# Patient Record
Sex: Male | Born: 1950 | Race: Black or African American | Hispanic: No | Marital: Single | Smoking: Former smoker
Health system: Southern US, Community
[De-identification: ages and names within clinical notes are randomized; demographics above are authoritative.]

## PROBLEM LIST (undated history)

## (undated) DIAGNOSIS — F1020 Alcohol dependence, uncomplicated: Secondary | ICD-10-CM

## (undated) DIAGNOSIS — U071 COVID-19: Secondary | ICD-10-CM

## (undated) DIAGNOSIS — I509 Heart failure, unspecified: Secondary | ICD-10-CM

## (undated) DIAGNOSIS — J449 Chronic obstructive pulmonary disease, unspecified: Secondary | ICD-10-CM

## (undated) DIAGNOSIS — N189 Chronic kidney disease, unspecified: Secondary | ICD-10-CM

## (undated) DIAGNOSIS — I219 Acute myocardial infarction, unspecified: Secondary | ICD-10-CM

## (undated) DIAGNOSIS — I10 Essential (primary) hypertension: Secondary | ICD-10-CM

## (undated) DIAGNOSIS — E785 Hyperlipidemia, unspecified: Secondary | ICD-10-CM

## (undated) DIAGNOSIS — T7840XA Allergy, unspecified, initial encounter: Secondary | ICD-10-CM

## (undated) DIAGNOSIS — M810 Age-related osteoporosis without current pathological fracture: Secondary | ICD-10-CM

## (undated) DIAGNOSIS — R0600 Dyspnea, unspecified: Secondary | ICD-10-CM

## (undated) DIAGNOSIS — Z8489 Family history of other specified conditions: Secondary | ICD-10-CM

## (undated) DIAGNOSIS — M1990 Unspecified osteoarthritis, unspecified site: Secondary | ICD-10-CM

## (undated) DIAGNOSIS — F32A Depression, unspecified: Secondary | ICD-10-CM

## (undated) DIAGNOSIS — J45909 Unspecified asthma, uncomplicated: Secondary | ICD-10-CM

## (undated) DIAGNOSIS — R011 Cardiac murmur, unspecified: Secondary | ICD-10-CM

## (undated) DIAGNOSIS — C801 Malignant (primary) neoplasm, unspecified: Secondary | ICD-10-CM

## (undated) DIAGNOSIS — H269 Unspecified cataract: Secondary | ICD-10-CM

## (undated) DIAGNOSIS — F1910 Other psychoactive substance abuse, uncomplicated: Secondary | ICD-10-CM

## (undated) DIAGNOSIS — F329 Major depressive disorder, single episode, unspecified: Secondary | ICD-10-CM

## (undated) DIAGNOSIS — J189 Pneumonia, unspecified organism: Secondary | ICD-10-CM

## (undated) DIAGNOSIS — I499 Cardiac arrhythmia, unspecified: Secondary | ICD-10-CM

## (undated) DIAGNOSIS — K219 Gastro-esophageal reflux disease without esophagitis: Secondary | ICD-10-CM

## (undated) HISTORY — DX: Chronic obstructive pulmonary disease, unspecified: J44.9

## (undated) HISTORY — DX: Unspecified cataract: H26.9

## (undated) HISTORY — DX: Cardiac murmur, unspecified: R01.1

## (undated) HISTORY — DX: Allergy, unspecified, initial encounter: T78.40XA

## (undated) HISTORY — DX: Other psychoactive substance abuse, uncomplicated: F19.10

## (undated) HISTORY — DX: Acute myocardial infarction, unspecified: I21.9

## (undated) HISTORY — DX: Age-related osteoporosis without current pathological fracture: M81.0

## (undated) HISTORY — PX: COLONOSCOPY: SHX174

## (undated) HISTORY — PX: CATARACT EXTRACTION: SUR2

---

## 1998-03-13 ENCOUNTER — Emergency Department (HOSPITAL_COMMUNITY): Admission: EM | Admit: 1998-03-13 | Discharge: 1998-03-13 | Payer: Self-pay | Admitting: Emergency Medicine

## 1998-03-27 ENCOUNTER — Encounter: Payer: Self-pay | Admitting: *Deleted

## 1998-03-27 ENCOUNTER — Emergency Department (HOSPITAL_COMMUNITY): Admission: EM | Admit: 1998-03-27 | Discharge: 1998-03-27 | Payer: Self-pay | Admitting: *Deleted

## 1998-07-09 ENCOUNTER — Emergency Department (HOSPITAL_COMMUNITY): Admission: EM | Admit: 1998-07-09 | Discharge: 1998-07-09 | Payer: Self-pay | Admitting: Emergency Medicine

## 1998-09-01 ENCOUNTER — Emergency Department (HOSPITAL_COMMUNITY): Admission: EM | Admit: 1998-09-01 | Discharge: 1998-09-01 | Payer: Self-pay | Admitting: Emergency Medicine

## 1998-09-01 ENCOUNTER — Encounter: Payer: Self-pay | Admitting: Emergency Medicine

## 1999-05-11 ENCOUNTER — Encounter: Payer: Self-pay | Admitting: *Deleted

## 1999-05-11 ENCOUNTER — Emergency Department (HOSPITAL_COMMUNITY): Admission: EM | Admit: 1999-05-11 | Discharge: 1999-05-11 | Payer: Self-pay | Admitting: *Deleted

## 1999-12-08 ENCOUNTER — Encounter: Payer: Self-pay | Admitting: Emergency Medicine

## 1999-12-08 ENCOUNTER — Inpatient Hospital Stay (HOSPITAL_COMMUNITY): Admission: EM | Admit: 1999-12-08 | Discharge: 1999-12-12 | Payer: Self-pay | Admitting: Emergency Medicine

## 1999-12-10 ENCOUNTER — Encounter: Payer: Self-pay | Admitting: Cardiovascular Disease

## 2000-12-31 ENCOUNTER — Inpatient Hospital Stay (HOSPITAL_COMMUNITY): Admission: EM | Admit: 2000-12-31 | Discharge: 2001-01-01 | Payer: Self-pay

## 2001-01-01 ENCOUNTER — Encounter: Payer: Self-pay | Admitting: Family Medicine

## 2001-01-13 ENCOUNTER — Encounter: Admission: RE | Admit: 2001-01-13 | Discharge: 2001-01-13 | Payer: Self-pay | Admitting: Sports Medicine

## 2001-02-26 ENCOUNTER — Encounter: Payer: Self-pay | Admitting: Family Medicine

## 2001-02-26 ENCOUNTER — Ambulatory Visit (HOSPITAL_COMMUNITY): Admission: RE | Admit: 2001-02-26 | Discharge: 2001-02-26 | Payer: Self-pay | Admitting: Family Medicine

## 2001-03-12 ENCOUNTER — Encounter: Payer: Self-pay | Admitting: Family Medicine

## 2001-03-12 ENCOUNTER — Ambulatory Visit (HOSPITAL_COMMUNITY): Admission: RE | Admit: 2001-03-12 | Discharge: 2001-03-12 | Payer: Self-pay | Admitting: Family Medicine

## 2002-03-28 ENCOUNTER — Emergency Department (HOSPITAL_COMMUNITY): Admission: EM | Admit: 2002-03-28 | Discharge: 2002-03-29 | Payer: Self-pay | Admitting: Emergency Medicine

## 2002-07-02 ENCOUNTER — Encounter (INDEPENDENT_AMBULATORY_CARE_PROVIDER_SITE_OTHER): Payer: Self-pay | Admitting: Family Medicine

## 2002-07-02 LAB — CONVERTED CEMR LAB: PSA: 0.56 ng/mL

## 2002-10-08 ENCOUNTER — Emergency Department (HOSPITAL_COMMUNITY): Admission: EM | Admit: 2002-10-08 | Discharge: 2002-10-09 | Payer: Self-pay | Admitting: Emergency Medicine

## 2002-10-08 ENCOUNTER — Encounter: Payer: Self-pay | Admitting: Emergency Medicine

## 2002-11-22 ENCOUNTER — Emergency Department (HOSPITAL_COMMUNITY): Admission: EM | Admit: 2002-11-22 | Discharge: 2002-11-22 | Payer: Self-pay | Admitting: Emergency Medicine

## 2002-12-24 ENCOUNTER — Emergency Department (HOSPITAL_COMMUNITY): Admission: EM | Admit: 2002-12-24 | Discharge: 2002-12-24 | Payer: Self-pay | Admitting: Emergency Medicine

## 2002-12-24 ENCOUNTER — Encounter: Payer: Self-pay | Admitting: Emergency Medicine

## 2002-12-30 ENCOUNTER — Ambulatory Visit (HOSPITAL_COMMUNITY): Admission: RE | Admit: 2002-12-30 | Discharge: 2002-12-30 | Payer: Self-pay | Admitting: Family Medicine

## 2002-12-30 ENCOUNTER — Encounter: Payer: Self-pay | Admitting: Family Medicine

## 2003-02-05 ENCOUNTER — Encounter: Payer: Self-pay | Admitting: Emergency Medicine

## 2003-02-05 ENCOUNTER — Emergency Department (HOSPITAL_COMMUNITY): Admission: EM | Admit: 2003-02-05 | Discharge: 2003-02-05 | Payer: Self-pay | Admitting: *Deleted

## 2003-09-29 ENCOUNTER — Emergency Department (HOSPITAL_COMMUNITY): Admission: EM | Admit: 2003-09-29 | Discharge: 2003-09-29 | Payer: Self-pay | Admitting: Emergency Medicine

## 2003-09-30 ENCOUNTER — Emergency Department (HOSPITAL_COMMUNITY): Admission: AD | Admit: 2003-09-30 | Discharge: 2003-09-30 | Payer: Self-pay | Admitting: Family Medicine

## 2004-01-27 ENCOUNTER — Emergency Department (HOSPITAL_COMMUNITY): Admission: EM | Admit: 2004-01-27 | Discharge: 2004-01-27 | Payer: Self-pay | Admitting: Emergency Medicine

## 2004-05-12 ENCOUNTER — Emergency Department (HOSPITAL_COMMUNITY): Admission: EM | Admit: 2004-05-12 | Discharge: 2004-05-12 | Payer: Self-pay | Admitting: Internal Medicine

## 2004-06-16 ENCOUNTER — Emergency Department (HOSPITAL_COMMUNITY): Admission: EM | Admit: 2004-06-16 | Discharge: 2004-06-16 | Payer: Self-pay | Admitting: Emergency Medicine

## 2004-06-28 ENCOUNTER — Ambulatory Visit: Payer: Self-pay | Admitting: Family Medicine

## 2004-07-12 ENCOUNTER — Ambulatory Visit: Payer: Self-pay | Admitting: Family Medicine

## 2004-07-15 ENCOUNTER — Ambulatory Visit (HOSPITAL_COMMUNITY): Admission: RE | Admit: 2004-07-15 | Discharge: 2004-07-15 | Payer: Self-pay | Admitting: Family Medicine

## 2004-07-23 ENCOUNTER — Ambulatory Visit: Payer: Self-pay | Admitting: Family Medicine

## 2004-08-05 ENCOUNTER — Ambulatory Visit: Payer: Self-pay | Admitting: Family Medicine

## 2005-05-29 ENCOUNTER — Emergency Department (HOSPITAL_COMMUNITY): Admission: EM | Admit: 2005-05-29 | Discharge: 2005-05-30 | Payer: Self-pay | Admitting: *Deleted

## 2005-08-13 ENCOUNTER — Ambulatory Visit: Payer: Self-pay | Admitting: Family Medicine

## 2005-09-16 ENCOUNTER — Ambulatory Visit: Payer: Self-pay | Admitting: Family Medicine

## 2006-02-27 ENCOUNTER — Ambulatory Visit: Payer: Self-pay | Admitting: Family Medicine

## 2006-07-31 ENCOUNTER — Ambulatory Visit: Payer: Self-pay | Admitting: Family Medicine

## 2007-03-22 ENCOUNTER — Encounter (INDEPENDENT_AMBULATORY_CARE_PROVIDER_SITE_OTHER): Payer: Self-pay | Admitting: Family Medicine

## 2007-03-22 DIAGNOSIS — IMO0002 Reserved for concepts with insufficient information to code with codable children: Secondary | ICD-10-CM | POA: Insufficient documentation

## 2007-03-22 DIAGNOSIS — I10 Essential (primary) hypertension: Secondary | ICD-10-CM

## 2007-03-22 DIAGNOSIS — M8700 Idiopathic aseptic necrosis of unspecified bone: Secondary | ICD-10-CM | POA: Insufficient documentation

## 2007-06-19 ENCOUNTER — Emergency Department (HOSPITAL_COMMUNITY): Admission: EM | Admit: 2007-06-19 | Discharge: 2007-06-19 | Payer: Self-pay | Admitting: Emergency Medicine

## 2007-07-13 ENCOUNTER — Emergency Department (HOSPITAL_COMMUNITY): Admission: EM | Admit: 2007-07-13 | Discharge: 2007-07-13 | Payer: Self-pay | Admitting: Emergency Medicine

## 2007-07-25 ENCOUNTER — Emergency Department (HOSPITAL_COMMUNITY): Admission: EM | Admit: 2007-07-25 | Discharge: 2007-07-25 | Payer: Self-pay | Admitting: Emergency Medicine

## 2007-08-28 ENCOUNTER — Emergency Department (HOSPITAL_COMMUNITY): Admission: EM | Admit: 2007-08-28 | Discharge: 2007-08-28 | Payer: Self-pay | Admitting: Emergency Medicine

## 2007-09-16 ENCOUNTER — Ambulatory Visit: Payer: Self-pay | Admitting: Family Medicine

## 2007-09-17 ENCOUNTER — Ambulatory Visit (HOSPITAL_COMMUNITY): Admission: RE | Admit: 2007-09-17 | Discharge: 2007-09-17 | Payer: Self-pay | Admitting: Family Medicine

## 2007-11-16 ENCOUNTER — Encounter (INDEPENDENT_AMBULATORY_CARE_PROVIDER_SITE_OTHER): Payer: Self-pay | Admitting: Family Medicine

## 2007-11-16 ENCOUNTER — Ambulatory Visit: Payer: Self-pay | Admitting: Internal Medicine

## 2007-11-16 LAB — CONVERTED CEMR LAB
ALT: 13 units/L (ref 0–53)
CO2: 22 meq/L (ref 19–32)
Calcium: 9.6 mg/dL (ref 8.4–10.5)
Chloride: 104 meq/L (ref 96–112)
Creatinine, Ser: 1.05 mg/dL (ref 0.40–1.50)
Eosinophils Relative: 4 % (ref 0–5)
Glucose, Bld: 85 mg/dL (ref 70–99)
HCT: 41.5 % (ref 39.0–52.0)
Lymphocytes Relative: 63 % — ABNORMAL HIGH (ref 12–46)
Lymphs Abs: 3.2 10*3/uL (ref 0.7–4.0)
MCV: 89.4 fL (ref 78.0–100.0)
Monocytes Relative: 6 % (ref 3–12)
PSA: 0.58 ng/mL (ref 0.10–4.00)
Platelets: 212 10*3/uL (ref 150–400)
RBC: 4.64 M/uL (ref 4.22–5.81)
Sed Rate: 6 mm/hr (ref 0–16)
Sodium: 139 meq/L (ref 135–145)
Total Protein: 7.6 g/dL (ref 6.0–8.3)
WBC: 5.1 10*3/uL (ref 4.0–10.5)

## 2007-11-19 ENCOUNTER — Ambulatory Visit (HOSPITAL_COMMUNITY): Admission: RE | Admit: 2007-11-19 | Discharge: 2007-11-19 | Payer: Self-pay | Admitting: Family Medicine

## 2007-12-30 ENCOUNTER — Ambulatory Visit: Payer: Self-pay | Admitting: Internal Medicine

## 2007-12-30 ENCOUNTER — Ambulatory Visit (HOSPITAL_COMMUNITY): Admission: RE | Admit: 2007-12-30 | Discharge: 2007-12-30 | Payer: Self-pay | Admitting: Family Medicine

## 2008-01-11 ENCOUNTER — Ambulatory Visit: Payer: Self-pay | Admitting: Family Medicine

## 2008-04-21 ENCOUNTER — Emergency Department (HOSPITAL_COMMUNITY): Admission: EM | Admit: 2008-04-21 | Discharge: 2008-04-21 | Payer: Self-pay | Admitting: Emergency Medicine

## 2008-04-25 ENCOUNTER — Encounter: Admission: RE | Admit: 2008-04-25 | Discharge: 2008-07-24 | Payer: Self-pay | Admitting: Orthopedic Surgery

## 2008-09-24 ENCOUNTER — Emergency Department (HOSPITAL_COMMUNITY): Admission: EM | Admit: 2008-09-24 | Discharge: 2008-09-24 | Payer: Self-pay | Admitting: Emergency Medicine

## 2008-12-05 ENCOUNTER — Observation Stay (HOSPITAL_COMMUNITY): Admission: EM | Admit: 2008-12-05 | Discharge: 2008-12-06 | Payer: Self-pay | Admitting: Emergency Medicine

## 2009-01-04 ENCOUNTER — Emergency Department (HOSPITAL_COMMUNITY): Admission: EM | Admit: 2009-01-04 | Discharge: 2009-01-04 | Payer: Self-pay | Admitting: Emergency Medicine

## 2009-12-05 ENCOUNTER — Emergency Department (HOSPITAL_COMMUNITY): Admission: EM | Admit: 2009-12-05 | Discharge: 2009-12-05 | Payer: Self-pay | Admitting: Emergency Medicine

## 2009-12-20 ENCOUNTER — Ambulatory Visit: Payer: Self-pay | Admitting: Family Medicine

## 2010-08-18 ENCOUNTER — Encounter: Payer: Self-pay | Admitting: Family Medicine

## 2010-11-04 LAB — STOOL CULTURE

## 2010-11-04 LAB — COMPREHENSIVE METABOLIC PANEL
ALT: 16 U/L (ref 0–53)
AST: 21 U/L (ref 0–37)
Alkaline Phosphatase: 76 U/L (ref 39–117)
CO2: 25 mEq/L (ref 19–32)
Chloride: 108 mEq/L (ref 96–112)
GFR calc Af Amer: >60 mL/min (ref >60)
GFR calc non Af Amer: >60 mL/min (ref >60)
Glucose, Bld: 92 mg/dL (ref 70–99)
Potassium: 4.3 mEq/L (ref 3.5–5.1)
Sodium: 139 mEq/L (ref 135–145)

## 2010-11-04 LAB — CLOSTRIDIUM DIFFICILE EIA

## 2010-11-04 LAB — LIPASE, BLOOD: Lipase: 15 U/L (ref 11–59)

## 2010-11-04 LAB — DIFFERENTIAL
Basophils Relative: 1 % (ref 0–1)
Eosinophils Absolute: 0.2 10*3/uL (ref 0.0–0.7)
Eosinophils Relative: 5 % (ref 0–5)
Neutrophils Relative %: 46 % (ref 43–77)

## 2010-11-04 LAB — CBC
Hemoglobin: 15.2 g/dL (ref 13.0–17.0)
MCHC: 34.2 g/dL (ref 30.0–36.0)
RBC: 4.86 MIL/uL (ref 4.22–5.81)

## 2010-11-05 LAB — DIFFERENTIAL
Basophils Relative: 1 % (ref 0–1)
Eosinophils Absolute: 0.2 10*3/uL (ref 0.0–0.7)
Lymphs Abs: 2.2 10*3/uL (ref 0.7–4.0)
Neutrophils Relative %: 48 % (ref 43–77)

## 2010-11-05 LAB — POCT I-STAT, CHEM 8
Creatinine, Ser: 1.2 mg/dL (ref 0.4–1.5)
HCT: 49 % (ref 39.0–52.0)
Hemoglobin: 16.7 g/dL (ref 13.0–17.0)
Potassium: 4.2 mEq/L (ref 3.5–5.1)
Sodium: 136 mEq/L (ref 135–145)

## 2010-11-05 LAB — CBC
MCV: 92.1 fL (ref 78.0–100.0)
Platelets: 237 10*3/uL (ref 150–400)
WBC: 5.3 10*3/uL (ref 4.0–10.5)

## 2010-12-13 NOTE — Cardiovascular Report (Signed)
[place]. [facility]  Patient:    Joe Garcia, Joe Garcia                     MRN: [id number] Proc. Date: 12/11/99 Adm. Date:  [id number] Disc. Date: [id number] Attending:  Edwyna Perfect CC:         C. Ulyess Mort, M.D.             Cardiac Catheterization Lab                        Cardiac Catheterization  PROCEDURE DONE BY:  Ricki Rodriguez, M.D.  CINE NO. CD [id number]  REFERRING PHYSICIAN:  C. Ulyess Mort, M.D.  PROCEDURES PERFORMED: 1. Left heart catheterization. 2. Selective coronary angiography. 3. Left ventricular function study. 4. Descending aortography.  INDICATIONS:  This 60 year old black male had substernal chest pain with abnormal CK-MBs and abnormal nuclear stress test, and he has a history of multiple risk factors.  COMPLICATIONS:  None.  Perclose suture was applied at the end of the procedure.  APPROACH:  Right femoral artery using 6-French diagnostic catheters.  HEMODYNAMIC DATA:  The left ventricular pressure was 155/12 mmHg.  The aortic pressure was 157/95 mmHg.  ANGIOGRAPHIC DATA:  Left ventriculogram:  The left ventriculogram showed mild anterior wall hypokinesia with an ejection fraction of 60%.  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  Coronary anatomy: 1. The left main coronary artery was unremarkable. 2. The left anterior descending artery was a long vessel wrapping around the    apex of the heart supplying posterior septum.  A diagonal vessel was    unremarkable. 3. The left circumflex coronary artery was a large vessel with very small    obtuse marginal branches. 4. The right coronary artery was dominant with small posterior descending    coronary artery.  IMPRESSION: 1. Normal coronaries. 2. Preserved left ventricular systolic function.  RECOMMENDATION:  This patient will undergo medical treatment with  evaluation for noncardiac chest pain.  The patient was strongly recommended to abstain from smoking and using any drugs.  He will be followed by me in my office in two weeks post discharge. DD:  12/11/99 TD:  12/15/99 Job: [id number]

## 2010-12-13 NOTE — Discharge Summary (Signed)
[place]. [facility]  Patient:    Joe Garcia, Joe Garcia                     MRN: [id number] Adm. Date:  [id number] Disc. Date: [id number] Attending:  Doneta Public Dictator:   Kevin Fenton, M.D. CC:         Ricki Rodriguez, M.D.  [facility]   Discharge Summary  DISCHARGE MEDICATIONS: 1. Flovent two puffs 110 mcg b.i.d. 2. Albuterol two puffs q.4h. p.r.n. 3. Aspirin 81 mg p.o. q.d. 4. Pepcid 20 mg p.o. b.i.d. as needed. 5. Toprol XL 25 mg one daily.  DIET:  The patient is to follow a low-fat, low-salt diet.  FOLLOW-UP:  Follow up with [facility] in two weeks.  He is also to call Ricki Rodriguez, M.D., for an appointment in two weeks.  HISTORY OF PRESENT ILLNESS:  The patient is a 60 year old African-American male who presented on the day of admission with chest pain of 10-15 minutes, which was substernal, associated with diaphoresis and crampy abdominal pain. He had a two-day history of URI symptoms prior to admission.  He had been taking albuterol up to 20 puffs a day for the last two days.  This pain was unlike anything he had had before.  He has no history of chest pain in the past.  He does have a history of asthma, dyspepsia with gastritis, remote alcohol use, and also hypertension.  BRIEF HOSPITAL COURSE:  The patient was admitted for rule out MI given his extensive family history of hypertension, age, and male sex.  The symptoms were consistent with typical angina.  However, the patient had a normal EKG and had negative enzymes x 3.  The patient was started on a beta blocker, as well as a Flovent MDI secondary to asthma which was out of control, and given Robitussin.  The patient underwent a Cardiolite which was negative for ischemia.  The lipid profile was within normal limits.  This pain was felt to be reflux esophagitis in nature.  He was discharged home with the above medications.  The patient did have a chest x-ray which was  negative.  The patient was afebrile throughout the admission.  The total cholesterol was 180, the LDL was 92, and the HDL was 75.  The patient had BP in the 110s-150s/70s-80s.  His peak flows here were 340. DD:  01/15/01 TD:  01/18/01 Job: [id number]

## 2010-12-13 NOTE — Discharge Summary (Signed)
[place]. [facility]  Patient:    Joe Garcia, Joe Garcia                     MRN: [id number] Adm. Date:  [id number] Disc. Date: [id number] Attending:  Edwyna Perfect Dictator:   Gwenlyn Found, M.D. CC:         Ricki Rodriguez, M.D.             Dr. Lilly Cove - [facility]                           Discharge Summary  DISCHARGE DIAGNOSES: 1. Noncardiac chest pain. 2. Abnormal CK, and CK-MBs. 3. Prior history of tobacco abuse, and alcohol abuse, crack cocaine use. 4. Question of a history pancreatitis. 5. Reactive airways disease.  MEDICATIONS: 1. Albumin 2 puffs q.i.d. and p.r.n. 2. Flovent 110 micrograms 2 puffs b.i.d. 3. Enteric-coated aspirin 325 mg p.o. q.d. 4. Protonix 40 mg p.o. q.d.  CONSULTATIONS:  Ricki Rodriguez, M.D.  PROCEDURES: 1. 2-D echocardiogram on Dec 10, 1999. 2. Adenosine Cardiolite on Dec 10, 1999. 3. Cardiac catheterization performed by Dr. Algie Coffer on Dec 11, 1999.  FOLLOWUP:  The patient is to see Dr. Lilly Cove on Dec 25, 1999, at 10 a.m. CHIEF COMPLAINT:  Chest pain x 1 day.  HISTORY OF PRESENT ILLNESS:  Joe Garcia is a 60 year old African American male with a history of reactive airway disease, gastritis, and chronic pancreatitis per his report.   He presents complaining of left precordial chest pain that started on the night prior to admission at 9 p.m.  It started while he was sitting in his positional in nature, meaning that it comes on when he sits up.  He reports that he has had pain like this before but it did not last this long.  Currently the pain lasts for about an hour and he always thought it was secondary to gas.  He does have a history of dyspepsia relieved with belching.  He denies increased shortness of breath, diaphoresis, nausea and vomiting, or radiation of his chest pain.  He does report working in the yard and thinks that musculoskeletal strength could be a possibility.  PAST MEDICAL HISTORY: 1.  Reactive airways disease x 6 years. 2. Question of osteoporosis. 3. Question of chronic pancreatitis. 4. History of gastritis. 5. Questionable history of hypertension in the past.  ALLERGIES:  No known drug allergies.  MEDICATIONS: 1. Albuterol metered dose inhaler 2 puffs q.i.d. and 2. Over-the-counter antacid agents.  SOCIAL HISTORY:  The patient lives with his girlfriend.  He works doing tile work part time.  TOBACCO:  He quit 6 years prior to admission but does smoke a 1/2 pack per day from the time of 60 years old.  ALCOHOL:  He quit that 6 years prior to admission but he reports a very heavy drinking in the past and ______ withdrawal symptoms.  DRUGS:  The patient used to smoke crack cocaine and quit 6 years prior to admission.  FAMILY HISTORY:  His mom died at the age of 107 from a myocardial infarction. She had end-stage renal disease.  Dad died at the age of 28 from an MI. Sister is living at the age of 35 and has hypertension and diabetes and he has 3 other sisters who are living who all have hypertension.  REVIEW OF SYSTEMS:  Positive for dysuria when he drinks dark sodas, and pain in his hips  bilaterally.  Negative for frequency, urgency, diarrhea, constipation, nausea and vomiting, fevers, chills, cough, orthopnea, PND, congestion, upper respiratory infection symptoms, abdominal pain, hematuria, melena or bright red blood per rectum.  PHYSICAL EXAMINATION:  VITAL SIGNS:  Temperature 98.2, blood pressure 141/85, pulse 59, respiratory rate 14, O2 saturations 97% on room air.  GENERAL:  He is well-nourished, well-developed [place] American male in no acute distress, he speaks easily without distress.  SKIN:  Warm and dry.  NECK:  Supple with no JVD, lymphadenopathy, or thyromegaly.  HEENT:  Normocephalic and atraumatic, PERRLA, EOMI, sclerae anicteric; oropharynx is moist with no erythema or exudates; nares are patent.  LUNGS:  Clear to auscultation  bilaterally.  HEART:  Regular rate and rhythm with a normal S1, S2.  He does have a 2/6 systolic murmur at the left upper sternal border.  CHEST:  Chest pain is reproducible with palpation in the mid clavicular line at about the third intercostal space.  ABDOMEN:  Soft, nontender, nondistended with normoactive bowel sounds.  RECTAL:  Per second year resident was heme negative with no stool.  NEUROLOGIC:  Cranial nerves II-XII are grossly intact and the toes are downgoing bilaterally.  EXTREMITIES:  There is no edema.  LABORATORY DATA:  WBC 5.2, hemoglobin 15.6, hematocrit 46.6, platelets 241, sodium 137, potassium 4.6, chloride 105, CO2 26, glucose 95, BUN 13, creatinine 1.1, calcium 9.2, total protein 7.4, albumin 3.9, AFT 30, ALT 23, alk. phos. 67, total bilirubin 1.4.  CK-MB were 255, and 7.3 initially then 169, and 5.2, then 149, and 4.4. Troponin I was less than 0.03.  Urine drug screen is positive for opiates and barbituates.  Chest x-ray shows hyperinflation of the lungs but otherwise no acute disease.  EKG shows sinus arrhythmia with an ventricular rate of 53, normal axis and no ST-T  changes.  ASSESSMENT:  Joe Garcia is a 60 year old African American male with a history of reactive airways disease, prior tobacco, alcohol and crack cocaine use, and gastritis.  He now presents with chest pain.  The reproducibility of his chest pain makes one suspicious that the pain is of noncardiac origin particularly musculoskeletal or GI.  This leaves the possibility of GI source, peptic ulcer disease or gastritis or more likely that he had indeed strained his muscles during tile laying.  However, his family history and history of tobacco abuse make [place] concerned about the slight possibility of myocardial ischemia.  Initially his CK and CK-MB are markedly abnormal.  The decision was made to admit this patient to the Medicine Teaching Service with monitoring on  telemetry to rule  out MI or cardial infarction with serial enzymes and EKGs and also to obtain a cardiology consult.  A decision was made to start a proton pump inhibitor, enteric-coated aspirin, prophylactically.  The patient was also started on Lovenox b.i.d.  HOSPITAL COURSE:   The patient was admitted to the Medicine Teaching Service under the direction of Dr. Marin Roberts. The patient was started on Protonix, enteric-coated aspirin and Lovenox b.i.d. and was monitored on telemetry.  He did have fasting lipid checks which showed a total cholesterol of 181, HDL of 53, LDL of 115, and triglycerides of 67, and EFR was checked which was 5 to help [place] determine whether or not pericarditis could be a possibility.  The patient did undergo 2-D echocardiogram also to help [place] rule out pericarditis or pericardial effusions.  This showed mild left ventricular dilatation, normal left ventricular wall thickness, and a normal left  ventricular ejection fraction.  His left atrium was mildly dilated.  There was no evidence of pericardial effusion, there is trace aortic regurgitation, mild mitral regurgitation and mild tricuspid regurgitation.  The patient continued to have chest pain throughout his hospitalization but at this time thought it was more likely gas as compared to the pain that he had experienced prior to reporting to the emergency department.  Dr. Algie Coffer saw the patient in consultation and did agree with the plan to perform a 2-D echocardiogram as well as the adenosine cardiolite study that was obtained on Dec 10, 1999.  This adenosine cardiolite study showed possible anteroseptal ischemia in this patient with a cardiac catheterization on Dec 11, 1999.  This study performed by Dr. Algie Coffer showed mild anterior wall hypokinesis with an ejection fraction of 50%.  The patient has voluntary left kidney and rudimentary right kidney.  He had normal coronary arteries.  Thus it was determined that the  patient did have noncardiac chest pain most likely peptic ulcer disease versus gastritis and was continued on empiric protonix therapy.  PLAN:  Consider an upper GI, with small-bowel follow-through should the patient continue to have chest pain in the future. The patient felt better by the time of discharge and was discharged with the plan to have some followup with [facility].  DISCHARGE INSTRUCTIONS:  The patient was instructed to report any further chest pain, nausea and vomiting, fevers or chills to Dr. Lilly Cove a [facility] Physician or to the [facility].  FOLLOWUP:  The patient is to followup with Dr. Lilly Cove on Dec 25, 1999, at 10 a.m. DD:  01/17/00 TD:  01/17/00 Job: [id number]

## 2011-01-04 ENCOUNTER — Emergency Department (HOSPITAL_COMMUNITY)
Admission: EM | Admit: 2011-01-04 | Discharge: 2011-01-04 | Disposition: A | Discharge status: Home / Self Care | Payer: Medicaid Other | Attending: Emergency Medicine | Admitting: Emergency Medicine

## 2011-01-04 DIAGNOSIS — S91009A Unspecified open wound, unspecified ankle, initial encounter: Secondary | ICD-10-CM | POA: Insufficient documentation

## 2011-01-04 DIAGNOSIS — Y9229 Other specified public building as the place of occurrence of the external cause: Secondary | ICD-10-CM | POA: Insufficient documentation

## 2011-01-04 DIAGNOSIS — M129 Arthropathy, unspecified: Secondary | ICD-10-CM | POA: Insufficient documentation

## 2011-01-04 DIAGNOSIS — W540XXA Bitten by dog, initial encounter: Secondary | ICD-10-CM | POA: Insufficient documentation

## 2011-01-04 DIAGNOSIS — J449 Chronic obstructive pulmonary disease, unspecified: Secondary | ICD-10-CM | POA: Insufficient documentation

## 2011-01-04 DIAGNOSIS — Y998 Other external cause status: Secondary | ICD-10-CM | POA: Insufficient documentation

## 2011-01-04 DIAGNOSIS — S81009A Unspecified open wound, unspecified knee, initial encounter: Secondary | ICD-10-CM | POA: Insufficient documentation

## 2011-01-04 DIAGNOSIS — J4489 Other specified chronic obstructive pulmonary disease: Secondary | ICD-10-CM | POA: Insufficient documentation

## 2011-01-07 ENCOUNTER — Inpatient Hospital Stay (INDEPENDENT_AMBULATORY_CARE_PROVIDER_SITE_OTHER)
Admission: RE | Admit: 2011-01-07 | Discharge: 2011-01-07 | Disposition: A | Discharge status: Home / Self Care | Payer: Medicaid Other | Source: Ambulatory Visit | Attending: Emergency Medicine | Admitting: Emergency Medicine

## 2011-01-07 DIAGNOSIS — Z23 Encounter for immunization: Secondary | ICD-10-CM

## 2011-12-31 ENCOUNTER — Emergency Department (HOSPITAL_COMMUNITY): Payer: Medicaid Other

## 2011-12-31 ENCOUNTER — Encounter (HOSPITAL_COMMUNITY): Payer: Self-pay | Admitting: Emergency Medicine

## 2011-12-31 ENCOUNTER — Emergency Department (HOSPITAL_COMMUNITY)
Admission: EM | Admit: 2011-12-31 | Discharge: 2011-12-31 | Disposition: A | Discharge status: Home / Self Care | Payer: Medicaid Other | Attending: Emergency Medicine | Admitting: Emergency Medicine

## 2011-12-31 DIAGNOSIS — J45909 Unspecified asthma, uncomplicated: Secondary | ICD-10-CM | POA: Insufficient documentation

## 2011-12-31 DIAGNOSIS — S61209A Unspecified open wound of unspecified finger without damage to nail, initial encounter: Secondary | ICD-10-CM | POA: Insufficient documentation

## 2011-12-31 DIAGNOSIS — S61219A Laceration without foreign body of unspecified finger without damage to nail, initial encounter: Secondary | ICD-10-CM

## 2011-12-31 DIAGNOSIS — W230XXA Caught, crushed, jammed, or pinched between moving objects, initial encounter: Secondary | ICD-10-CM | POA: Insufficient documentation

## 2011-12-31 DIAGNOSIS — S61319A Laceration without foreign body of unspecified finger with damage to nail, initial encounter: Secondary | ICD-10-CM

## 2011-12-31 DIAGNOSIS — I10 Essential (primary) hypertension: Secondary | ICD-10-CM | POA: Insufficient documentation

## 2011-12-31 DIAGNOSIS — F17200 Nicotine dependence, unspecified, uncomplicated: Secondary | ICD-10-CM | POA: Insufficient documentation

## 2011-12-31 HISTORY — DX: Unspecified asthma, uncomplicated: J45.909

## 2011-12-31 HISTORY — DX: Essential (primary) hypertension: I10

## 2011-12-31 MED ORDER — BUPIVACAINE HCL (PF) 0.5 % IJ SOLN
10.0000 mL | Freq: Once | INTRAMUSCULAR | Status: AC
  Administered 2011-12-31: 10 mL
  Filled 2011-12-31: qty 10

## 2011-12-31 MED ORDER — TRAMADOL HCL 50 MG PO TABS: 50.0000 mg | ORAL_TABLET | Freq: Four times a day (QID) | ORAL | Status: AC | PRN

## 2011-12-31 NOTE — ED Notes (Signed)
PT. PRESENTS WITH LEFT MIDDLE DISTAL FINGER LACERATION SUSTAINED WHILE LOADING A TABLE THIS EVENING , BLEEDING CONTROLLED / DRESSING APPLIED AT TRIAGE .  DRANK ETOH THIS EVENING .

## 2011-12-31 NOTE — Discharge Instructions (Signed)
As discussed, you have a partial avulsion, and laceration through the nail.  This was used as a natural dressing.  The sutures will dissolve on their on and will not need to be removed.  Please keep the bulky dressing on until Friday when, you can remove it and place.  A small Band-Aid over the area.  Keep your nail clipped short until it totally grows out, which will be 6-8 weeks

## 2011-12-31 NOTE — ED Provider Notes (Signed)
History     CSN: [id number]  Arrival date & time 12/31/11  1913   First MD Initiated Contact with Patient 12/31/11 2039      Chief Complaint  Patient presents with  . 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  He now has a fracture through the nail bed on his left middle finger  Patient is a 61 y.o. male presenting with skin laceration. The history is provided by the patient.  Laceration  The incident occurred 1 to 2 hours ago. The laceration is located on the left hand. The laceration is 1 cm in size. The laceration mechanism was a a blunt object. The pain is at a severity of 3/10. The pain is mild. The pain has been constant since onset. His tetanus status is UTD.    Past Medical History  Diagnosis Date  . Asthma   . Hypertension     History reviewed. No pertinent past surgical history.  No family history on file.  History  Substance Use Topics  . Smoking status: Current Everyday Smoker  . Smokeless tobacco: Not on file  . Alcohol Use: Yes      Review of Systems  Constitutional: Negative for fever and chills.  Gastrointestinal: Negative for nausea.  Skin: Negative for pallor.  Neurological: Negative for dizziness, weakness and numbness.    Allergies  Penicillins  Home Medications   Current Outpatient Rx  Name Route Sig Dispense Refill  . TRAMADOL HCL 50 MG PO TABS Oral Take 1 tablet (50 mg total) by mouth every 6 (six) hours as needed for pain. 15 tablet 0    BP 146/81  Pulse 88  Temp(Src) 98.2 F (36.8 C) (Oral)  Resp 16  SpO2 99%  Physical Exam  Constitutional: He is oriented to person, place, and time. He appears well-developed and well-nourished.  Eyes: Pupils are equal, round, and reactive to light.  Neck: Normal range of motion.  Cardiovascular: Normal rate.     Musculoskeletal: Normal range of motion. He exhibits tenderness. He exhibits no edema.  Neurological: He is alert and oriented to person, place, and time.  Skin: Skin is warm.  Psychiatric: He has a normal mood and affect.    ED Course  LACERATION REPAIR Date/Time: 12/31/2011 9:24 PM Performed by: Arman Filter Authorized by: Arman Filter Consent: Verbal consent obtained. Risks and benefits: risks, benefits and alternatives were discussed Consent given by: patient Patient understanding: patient states understanding of the procedure being performed Required items: required blood products, implants, devices, and special equipment available Patient identity confirmed: verbally with patient Time out: Immediately prior to procedure a "time out" was called to verify the correct patient, procedure, equipment, support staff and site/side marked as required. Body area: upper extremity Location details: left long finger Laceration length: 1 cm Foreign bodies: no foreign bodies Tendon involvement: none Nerve involvement: none Vascular damage: yes Anesthesia: digital block Local anesthetic: bupivacaine 0.5% without epinephrine Anesthetic total: 2 ml Patient sedated: no Irrigation solution: saline Irrigation method: syringe Amount of cleaning: extensive Debridement: none Degree of undermining: none Subcutaneous closure: 4-0 Vicryl Number of sutures: 5 Technique: simple Approximation: loose Patient tolerance: Patient tolerated the procedure well with no immediate complications. Comments: Used the nail as a natural dressing   (including critical care time)  Labs Reviewed - No data to display Dg Finger Middle Left  12/31/2011  *  RADIOLOGY REPORT*  Clinical Data: Laceration to the middle finger.  LEFT MIDDLE FINGER 2+V  Comparison: None.  Findings: A soft tissue laceration is present at the nail bed. There is no underlying radiopaque foreign body.  There is no underlying fracture.  Soft  tissue swelling is present in the distal finger.  The bones are slightly demineralized.  Soft tissue swelling is noted about the DIP joint as well.  IMPRESSION:  1.  Dorsal laceration over the nail bed without underlying fracture. 2.  No radiopaque foreign body.  Original Report Authenticated By: Jamesetta Orleans. MATTERN, M.D.     1. Finger laceration   2. Laceration of nail bed of finger       MDM   She has a laceration partial through the nailbed on his left middle finger nail.  Will perform a digital block and repair        Arman Filter, NP 12/31/11 2128

## 2011-12-31 NOTE — ED Notes (Signed)
Prescription given with discharge instructions.  

## 2011-12-31 NOTE — ED Provider Notes (Signed)
Medical screening examination/treatment/procedure(s) were performed by non-physician practitioner and as supervising physician I was immediately available for consultation/collaboration.   Wenonah Milo, MD 12/31/11 2350 

## 2011-12-31 NOTE — ED Notes (Signed)
Suture cart to bedside. 

## 2012-09-21 ENCOUNTER — Emergency Department (HOSPITAL_COMMUNITY): Payer: Medicaid Other

## 2012-09-21 ENCOUNTER — Encounter (HOSPITAL_COMMUNITY): Payer: Self-pay | Admitting: *Deleted

## 2012-09-21 ENCOUNTER — Emergency Department (HOSPITAL_COMMUNITY)
Admission: EM | Admit: 2012-09-21 | Discharge: 2012-09-22 | Disposition: A | Discharge status: Other Acute Inpatient Hospital | Payer: Medicaid Other | Attending: Emergency Medicine | Admitting: Emergency Medicine

## 2012-09-21 DIAGNOSIS — M62838 Other muscle spasm: Secondary | ICD-10-CM | POA: Insufficient documentation

## 2012-09-21 DIAGNOSIS — Q602 Renal agenesis, unspecified: Secondary | ICD-10-CM | POA: Insufficient documentation

## 2012-09-21 DIAGNOSIS — F17200 Nicotine dependence, unspecified, uncomplicated: Secondary | ICD-10-CM | POA: Insufficient documentation

## 2012-09-21 DIAGNOSIS — R141 Gas pain: Secondary | ICD-10-CM | POA: Insufficient documentation

## 2012-09-21 DIAGNOSIS — I10 Essential (primary) hypertension: Secondary | ICD-10-CM | POA: Insufficient documentation

## 2012-09-21 DIAGNOSIS — F1020 Alcohol dependence, uncomplicated: Secondary | ICD-10-CM | POA: Insufficient documentation

## 2012-09-21 DIAGNOSIS — F1010 Alcohol abuse, uncomplicated: Secondary | ICD-10-CM

## 2012-09-21 DIAGNOSIS — Z8719 Personal history of other diseases of the digestive system: Secondary | ICD-10-CM | POA: Insufficient documentation

## 2012-09-21 DIAGNOSIS — J45909 Unspecified asthma, uncomplicated: Secondary | ICD-10-CM | POA: Insufficient documentation

## 2012-09-21 DIAGNOSIS — R110 Nausea: Secondary | ICD-10-CM | POA: Insufficient documentation

## 2012-09-21 DIAGNOSIS — F10929 Alcohol use, unspecified with intoxication, unspecified: Secondary | ICD-10-CM

## 2012-09-21 DIAGNOSIS — Z79899 Other long term (current) drug therapy: Secondary | ICD-10-CM | POA: Insufficient documentation

## 2012-09-21 DIAGNOSIS — R142 Eructation: Secondary | ICD-10-CM | POA: Insufficient documentation

## 2012-09-21 HISTORY — DX: Alcohol dependence, uncomplicated: F10.20

## 2012-09-21 HISTORY — DX: Family history of other specified conditions: Z84.89

## 2012-09-21 LAB — CBC WITH DIFFERENTIAL/PLATELET
Basophils Absolute: 0 10*3/uL (ref 0.0–0.1)
Basophils Relative: 1 % (ref 0–1)
Eosinophils Absolute: 0.1 K/uL (ref 0.0–0.7)
Eosinophils Relative: 2 % (ref 0–5)
HCT: 39.9 % (ref 39.0–52.0)
Hemoglobin: 13.7 g/dL (ref 13.0–17.0)
Lymphocytes Relative: 39 % (ref 12–46)
Lymphs Abs: 1.8 K/uL (ref 0.7–4.0)
MCH: 31.7 pg (ref 26.0–34.0)
MCHC: 34.3 g/dL (ref 30.0–36.0)
MCV: 92.4 fL (ref 78.0–100.0)
Monocytes Absolute: 0.3 10*3/uL (ref 0.1–1.0)
Monocytes Relative: 7 % (ref 3–12)
Neutro Abs: 2.4 K/uL (ref 1.7–7.7)
Neutrophils Relative %: 52 % (ref 43–77)
Platelets: 221 K/uL (ref 150–400)
RBC: 4.32 MIL/uL (ref 4.22–5.81)
RDW: 13.7 % (ref 11.5–15.5)
WBC: 4.6 K/uL (ref 4.0–10.5)

## 2012-09-21 LAB — URINALYSIS, ROUTINE W REFLEX MICROSCOPIC
Bilirubin Urine: NEGATIVE
Glucose, UA: NEGATIVE mg/dL
Hgb urine dipstick: NEGATIVE
Ketones, ur: NEGATIVE mg/dL
Leukocytes, UA: NEGATIVE
Nitrite: NEGATIVE
Protein, ur: NEGATIVE mg/dL
Specific Gravity, Urine: 1.015 (ref 1.005–1.030)
Urobilinogen, UA: 0.2 mg/dL (ref 0.0–1.0)
pH: 5 (ref 5.0–8.0)

## 2012-09-21 LAB — COMPREHENSIVE METABOLIC PANEL WITH GFR
BUN: 9 mg/dL (ref 6–23)
CO2: 22 meq/L (ref 19–32)
Calcium: 9.5 mg/dL (ref 8.4–10.5)
Creatinine, Ser: 0.83 mg/dL (ref 0.50–1.35)
GFR calc Af Amer: >90 mL/min (ref >90)
GFR calc non Af Amer: >90 mL/min (ref >90)
Glucose, Bld: 76 mg/dL (ref 70–99)
Total Protein: 7.7 g/dL (ref 6.0–8.3)

## 2012-09-21 LAB — RAPID URINE DRUG SCREEN, HOSP PERFORMED
Amphetamines: NOT DETECTED
Barbiturates: NOT DETECTED
Benzodiazepines: NOT DETECTED
Cocaine: NOT DETECTED
Opiates: NOT DETECTED
Tetrahydrocannabinol: NOT DETECTED

## 2012-09-21 LAB — TROPONIN I: Troponin I: <0.3 ng/mL (ref <0.30)

## 2012-09-21 LAB — COMPREHENSIVE METABOLIC PANEL
ALT: 20 U/L (ref 0–53)
AST: 46 U/L — ABNORMAL HIGH (ref 0–37)
Albumin: 3.5 g/dL (ref 3.5–5.2)
Alkaline Phosphatase: 113 U/L (ref 39–117)
Chloride: 101 mEq/L (ref 96–112)
Potassium: 4.2 mEq/L (ref 3.5–5.1)
Sodium: 140 mEq/L (ref 135–145)
Total Bilirubin: 0.4 mg/dL (ref 0.3–1.2)

## 2012-09-21 LAB — ETHANOL: Alcohol, Ethyl (B): 61 mg/dL — ABNORMAL HIGH (ref 0–11)

## 2012-09-21 LAB — LIPASE, BLOOD: Lipase: 21 U/L (ref 11–59)

## 2012-09-21 MED ORDER — LORAZEPAM 2 MG/ML IJ SOLN
1.0000 mg | Freq: Once | INTRAMUSCULAR | Status: AC
  Administered 2012-09-21: 1 mg via INTRAVENOUS

## 2012-09-21 MED ORDER — SODIUM CHLORIDE 0.9 % IV BOLUS (SEPSIS)
1000.0000 mL | Freq: Once | INTRAVENOUS | Status: AC
  Administered 2012-09-21: 1000 mL via INTRAVENOUS

## 2012-09-21 MED ORDER — THIAMINE HCL 100 MG/ML IJ SOLN: 100.0000 mg | Freq: Every day | INTRAMUSCULAR | Status: DC

## 2012-09-21 MED ORDER — SIMETHICONE 40 MG/0.6ML PO SUSP (UNIT DOSE)
40.0000 mg | Freq: Once | ORAL | Status: AC
  Administered 2012-09-21: 40 mg via ORAL
  Filled 2012-09-21: qty 0.6

## 2012-09-21 MED ORDER — ONDANSETRON 4 MG PO TBDP
4.0000 mg | ORAL_TABLET | Freq: Once | ORAL | Status: AC
  Administered 2012-09-21: 4 mg via ORAL
  Filled 2012-09-21: qty 1

## 2012-09-21 MED ORDER — ONDANSETRON HCL 8 MG PO TABS
4.0000 mg | ORAL_TABLET | Freq: Three times a day (TID) | ORAL | Status: DC | PRN
Start: 2012-09-21 — End: 2012-09-22

## 2012-09-21 MED ORDER — GI COCKTAIL ~~LOC~~
30.0000 mL | Freq: Once | ORAL | Status: AC
  Administered 2012-09-21: 30 mL via ORAL
  Filled 2012-09-21: qty 30

## 2012-09-21 MED ORDER — LORAZEPAM 2 MG/ML IJ SOLN
1.0000 mg | Freq: Four times a day (QID) | INTRAMUSCULAR | Status: DC | PRN
  Administered 2012-09-21: 1 mg via INTRAVENOUS
  Filled 2012-09-21 (×2): qty 1

## 2012-09-21 MED ORDER — MORPHINE SULFATE 4 MG/ML IJ SOLN
4.0000 mg | Freq: Once | INTRAMUSCULAR | Status: AC
  Administered 2012-09-21: 4 mg via INTRAVENOUS
  Filled 2012-09-21: qty 1

## 2012-09-21 MED ORDER — ADULT MULTIVITAMIN W/MINERALS CH
1.0000 | ORAL_TABLET | Freq: Every day | ORAL | Status: DC
  Administered 2012-09-21: 1 via ORAL
  Filled 2012-09-21: qty 1

## 2012-09-21 MED ORDER — VITAMIN B-1 100 MG PO TABS
100.0000 mg | ORAL_TABLET | Freq: Every day | ORAL | Status: DC
  Administered 2012-09-21: 100 mg via ORAL
  Filled 2012-09-21: qty 1

## 2012-09-21 MED ORDER — ACETAMINOPHEN 325 MG PO TABS: 650.0000 mg | ORAL_TABLET | ORAL | Status: DC | PRN

## 2012-09-21 MED ORDER — FOLIC ACID 1 MG PO TABS
1.0000 mg | ORAL_TABLET | Freq: Every day | ORAL | Status: DC
  Administered 2012-09-21: 1 mg via ORAL
  Filled 2012-09-21: qty 1

## 2012-09-21 MED ORDER — LORAZEPAM 1 MG PO TABS
1.0000 mg | ORAL_TABLET | Freq: Four times a day (QID) | ORAL | Status: DC | PRN
  Administered 2012-09-21: 1 mg via ORAL
  Filled 2012-09-21 (×2): qty 1

## 2012-09-21 NOTE — ED Notes (Signed)
Pt is here by ems for abdominal pain and tremors and left leg pain.  Pt has hx of etoh abuse, last drink was last night.  [facility] for "help with his alcoholism" and they called ems due to pt complaints.

## 2012-09-21 NOTE — ED Provider Notes (Signed)
Pt had gone to a rehab facility himself wanting help with alcoholism.  He reports has ahd withdrawal symptoms including visual hallucinations in the past. Last had a drink yesterday, alcohol level was 61.  AST was up, but other LFT's ok.  Plain films show chronic changes.  Abd is soft, no guard or rebound.  Pt reportedly did have abd pain and muscle cramps and shaking and so instructed to come to the ED.  Pt denies hallucinations to me currently, no SI, HI.  Pt felt improved after GI cocktail, ativan in the ED.  Have discussed with [facility] to assist in finding detox facility.  Pt on CIWA protocol for now.   I saw and evaluated the patient, reviewed the resident's note and I agree with the findings and plan.  Impression: Alcohol withdrawal Alcoholism Alcohol related gastritis    Gavin Pound. Tosca Pletz, MD 09/21/12 1505

## 2012-09-21 NOTE — ED Notes (Signed)
Patient transported to X-ray 

## 2012-09-21 NOTE — ED Provider Notes (Signed)
History     CSN: [id number]  Arrival date & time 09/21/12  1040   First MD Initiated Contact with Patient 09/21/12 1130      Chief Complaint  Patient presents with  . 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  [facility] Trying to get help with alcohol today when he developed symptoms (below) so they sent him to the ED for medical attention.    Patient states he drank a significant amount 5 beers and 1 pt of liquor yesterday (?time of last drink he states it was at night).  He passed out and woke up and went to get help for alcohol today.  He complains of belching a lot, abdominal pain (epigastric and RLQ) 3/10 now but was more painful.  Abdominal pain seems to be improving.  Belching helps abdominal pain.  He has felt nauseated without vomiting.  He had diarrhea last week (last time for diarrhea was the day before yesterday).  He also complains of muscles spasms in his lower legs b/l which is new.    PsuH: none   SH: heavy drinker beer, liquor, wine; no PCP previously seen at [facility]. Lives at home with kids.  Wants help alcohol it is causing arguments at home with his family.    The history is provided by the patient. No language interpreter was used.    Past Medical History  Diagnosis Date  . Asthma   . Hypertension   . Alcoholism     History reviewed. No pertinent past surgical history.  No family history on file.  History  Substance Use Topics  . Smoking status: Current Every Day Smoker  . Smokeless tobacco: Not on file  . Alcohol Use: Yes     Comment: heavy drinking of etoh for last week per pt      Review of Systems  Constitutional: Negative for fever and chills.  Respiratory: Negative for  shortness of breath.   Cardiovascular: Negative for chest pain.  Gastrointestinal: Positive for nausea and abdominal pain. Negative for vomiting and diarrhea.  Neurological: Negative for headaches.  Psychiatric/Behavioral:       +etOH abuse  All other systems reviewed and are negative.    Allergies  Penicillins  Home Medications   Current Outpatient Rx  Name  Route  Sig  Dispense  Refill  . albuterol (PROVENTIL HFA;VENTOLIN HFA) 108 (90 BASE) MCG/ACT inhaler   Inhalation   Inhale 2 puffs into the lungs every 6 (six) hours as needed for wheezing.           BP 153/89  Pulse 82  Temp(Src) 98.1 F (36.7 C) (Oral)  Resp 15  SpO2 100%  Physical Exam  Nursing note and vitals reviewed. Constitutional: He is oriented to person, place, and time. He appears well-developed and well-nourished. He is cooperative.  HENT:  Head: Normocephalic and atraumatic.  Mouth/Throat: Mucous membranes are normal. Abnormal dentition. No oropharyngeal exudate.  Eyes: Conjunctivae are normal. Pupils are equal, round, and reactive to light. Right eye exhibits no discharge. Left eye exhibits no discharge. No scleral icterus.  Cardiovascular: Normal rate, regular rhythm, S1 normal, S2 normal and normal heart sounds.   No murmur heard. Pulmonary/Chest: Effort normal. He has wheezes.  Abdominal: Soft. Bowel sounds are normal. He exhibits no distension. There is tenderness  in the right lower quadrant.  Belching on exam  Musculoskeletal: He exhibits no edema.  Neurological: He is alert and oriented to person, place, and time.  Skin: Skin is warm, dry and intact. No rash noted.  Psychiatric: He has a normal mood and affect. His speech is normal and behavior is normal. Judgment and thought content normal. Cognition and memory are normal.    ED Course  Procedures (including critical care time)  Labs Reviewed  COMPREHENSIVE METABOLIC PANEL - Abnormal; Notable for the following:    AST 46 (*)    All  other components within normal limits  ETHANOL - Abnormal; Notable for the following:    Alcohol, Ethyl (B) 60 (*)    All other components within normal limits  LIPASE, BLOOD  CBC WITH DIFFERENTIAL  URINALYSIS, ROUTINE W REFLEX MICROSCOPIC  URINE RAPID DRUG SCREEN (HOSP PERFORMED)  TROPONIN I   Dg Abd 1 View  09/21/2012  *RADIOLOGY REPORT*  Clinical Data: 62 year old male with severe lower abdominal pain.  ABDOMEN - 1 VIEW  Comparison: 01/04/2009.  Findings: Two supine views. Nonobstructed bowel gas pattern. Grossly negative lung bases.  No definite pneumoperitoneum. Chronic somewhat coarse calcification to the right of the lumbar spine is unchanged since 2010.  This is nonspecific. Cholelithiasis/porcelain gallbladder and dystrophic calcification of the right adrenal gland may be the two most likely possibilities.  Chronic severe sclerosis and subchondral collapse of both femoral heads again noted. No acute osseous abnormality identified.  IMPRESSION: 1.  Nonobstructed bowel gas pattern and no acute radiographic findings in the abdomen. 2.  Chronic bilateral femoral head AVN. 3.  Chronic soft tissue calcification of the right of the lumbar spine unchanged since 2010.   Original Report Authenticated By: Erskine Speed, M.D.      1. Abdominal pain   2. Alcohol intoxication      Date: 09/21/2012  Rate: 80  Rhythm: normal sinus rhythm  QRS Axis: normal  Intervals: normal  ST/T Wave abnormalities: normal  Conduction Disutrbances:right bundle branch block, prolonged QTc  Narrative Interpretation:   Old EKG Reviewed: none available    MDM  Ddx: gastritis CMET, lipase, trop, ethanol, UDS, cbc, trop, UA, KUB GI cocktail, simethicone  Alcohol intoxication/Alcohol withdrawal CIWA protocol   Transfer to C pod then detox referral  Shirlee Latch MD [phone]         Annett Gula, MD 09/21/12 1338  Annett Gula, MD 09/21/12 [phone]

## 2012-09-21 NOTE — ED Notes (Signed)
PATIENT LAST DRINK AROUND 11 PM ON 2/24. STATES HE DRINKS 3 40S AND A PINT OF LIQUOR DAILY . STATES HE HAS DONE THIS FOR YEARS. STATES HE WANTS TO GET SOBER BECAUSE HE IS TIRED OF ARGUING WITH HIS KIDS. STATES HE TRIED TO DETOX AT HOME 2 MONTHS AGO BUT ONLY LASTED 2 DAYS. DENIES RECREATIONAL DRUG USE. HE REQUESTS DETOX.

## 2012-09-21 NOTE — ED Notes (Signed)
Notified Dr. Shirlee Latch that while pt is feeling better and looks better CIWA remains above 10.  MD to come see pt.  Await further orders
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
## 2012-09-22 ENCOUNTER — Inpatient Hospital Stay (HOSPITAL_COMMUNITY)
Admission: AD | Admit: 2012-09-22 | Discharge: 2012-09-25 | DRG: 897 | Disposition: A | Discharge status: Home / Self Care | Payer: Medicaid Other | Source: Intra-hospital | Attending: Psychiatry | Admitting: Psychiatry

## 2012-09-22 ENCOUNTER — Encounter (HOSPITAL_COMMUNITY): Payer: Self-pay | Admitting: *Deleted

## 2012-09-22 DIAGNOSIS — Z79899 Other long term (current) drug therapy: Secondary | ICD-10-CM

## 2012-09-22 DIAGNOSIS — F10939 Alcohol use, unspecified with withdrawal, unspecified: Secondary | ICD-10-CM | POA: Diagnosis present

## 2012-09-22 DIAGNOSIS — R45851 Suicidal ideations: Secondary | ICD-10-CM

## 2012-09-22 DIAGNOSIS — M8700 Idiopathic aseptic necrosis of unspecified bone: Secondary | ICD-10-CM

## 2012-09-22 DIAGNOSIS — F1020 Alcohol dependence, uncomplicated: Principal | ICD-10-CM | POA: Diagnosis present

## 2012-09-22 DIAGNOSIS — J45909 Unspecified asthma, uncomplicated: Secondary | ICD-10-CM | POA: Diagnosis present

## 2012-09-22 DIAGNOSIS — I10 Essential (primary) hypertension: Secondary | ICD-10-CM | POA: Diagnosis present

## 2012-09-22 DIAGNOSIS — F329 Major depressive disorder, single episode, unspecified: Secondary | ICD-10-CM | POA: Diagnosis present

## 2012-09-22 DIAGNOSIS — F1010 Alcohol abuse, uncomplicated: Secondary | ICD-10-CM | POA: Diagnosis present

## 2012-09-22 DIAGNOSIS — F10239 Alcohol dependence with withdrawal, unspecified: Secondary | ICD-10-CM

## 2012-09-22 DIAGNOSIS — IMO0002 Reserved for concepts with insufficient information to code with codable children: Secondary | ICD-10-CM

## 2012-09-22 HISTORY — DX: Major depressive disorder, single episode, unspecified: F32.9

## 2012-09-22 HISTORY — DX: Depression, unspecified: F32.A

## 2012-09-22 MED ORDER — HYDROXYZINE HCL 25 MG PO TABS: 25.0000 mg | ORAL_TABLET | Freq: Four times a day (QID) | ORAL | Status: AC | PRN

## 2012-09-22 MED ORDER — TRAZODONE HCL 50 MG PO TABS
50.0000 mg | ORAL_TABLET | Freq: Every evening | ORAL | Status: DC | PRN
  Administered 2012-09-22 – 2012-09-24 (×4): 50 mg via ORAL
  Filled 2012-09-22 (×12): qty 1

## 2012-09-22 MED ORDER — ONDANSETRON 4 MG PO TBDP: 4.0000 mg | ORAL_TABLET | Freq: Four times a day (QID) | ORAL | Status: AC | PRN

## 2012-09-22 MED ORDER — ACETAMINOPHEN 325 MG PO TABS
650.0000 mg | ORAL_TABLET | Freq: Four times a day (QID) | ORAL | Status: DC | PRN
  Administered 2012-09-22: 650 mg via ORAL

## 2012-09-22 MED ORDER — CHLORDIAZEPOXIDE HCL 25 MG PO CAPS
25.0000 mg | ORAL_CAPSULE | Freq: Three times a day (TID) | ORAL | Status: AC
  Administered 2012-09-23 – 2012-09-24 (×3): 25 mg via ORAL
  Filled 2012-09-22 (×3): qty 1

## 2012-09-22 MED ORDER — CLONIDINE HCL 0.1 MG/24HR TD PTWK
0.1000 mg | MEDICATED_PATCH | TRANSDERMAL | Status: DC
  Administered 2012-09-22: 0.1 mg via TRANSDERMAL
  Filled 2012-09-22 (×2): qty 1

## 2012-09-22 MED ORDER — ADULT MULTIVITAMIN W/MINERALS CH
1.0000 | ORAL_TABLET | Freq: Every day | ORAL | Status: DC
  Administered 2012-09-22 – 2012-09-25 (×4): 1 via ORAL
  Filled 2012-09-22 (×6): qty 1

## 2012-09-22 MED ORDER — ALUM & MAG HYDROXIDE-SIMETH 200-200-20 MG/5ML PO SUSP: 30.0000 mL | ORAL | Status: DC | PRN

## 2012-09-22 MED ORDER — CHLORDIAZEPOXIDE HCL 25 MG PO CAPS
25.0000 mg | ORAL_CAPSULE | Freq: Four times a day (QID) | ORAL | Status: AC
  Administered 2012-09-22 – 2012-09-23 (×6): 25 mg via ORAL
  Filled 2012-09-22 (×6): qty 1

## 2012-09-22 MED ORDER — CHLORDIAZEPOXIDE HCL 25 MG PO CAPS
25.0000 mg | ORAL_CAPSULE | ORAL | Status: AC
  Administered 2012-09-24 – 2012-09-25 (×2): 25 mg via ORAL
  Filled 2012-09-22 (×2): qty 1

## 2012-09-22 MED ORDER — VITAMIN B-1 100 MG PO TABS
100.0000 mg | ORAL_TABLET | Freq: Every day | ORAL | Status: DC
  Administered 2012-09-23 – 2012-09-25 (×3): 100 mg via ORAL
  Filled 2012-09-22 (×5): qty 1

## 2012-09-22 MED ORDER — THIAMINE HCL 100 MG/ML IJ SOLN: 100.0000 mg | Freq: Once | INTRAMUSCULAR | Status: DC

## 2012-09-22 MED ORDER — LOPERAMIDE HCL 2 MG PO CAPS: 2.0000 mg | ORAL_CAPSULE | ORAL | Status: AC | PRN

## 2012-09-22 MED ORDER — ALBUTEROL SULFATE HFA 108 (90 BASE) MCG/ACT IN AERS: 2.0000 | INHALATION_SPRAY | Freq: Four times a day (QID) | RESPIRATORY_TRACT | Status: DC | PRN

## 2012-09-22 MED ORDER — MAGNESIUM HYDROXIDE 400 MG/5ML PO SUSP: 30.0000 mL | Freq: Every day | ORAL | Status: DC | PRN

## 2012-09-22 MED ORDER — CHLORDIAZEPOXIDE HCL 25 MG PO CAPS
25.0000 mg | ORAL_CAPSULE | Freq: Every day | ORAL | Status: DC
Start: 2012-09-26 — End: 2012-09-25

## 2012-09-22 MED ORDER — CHLORDIAZEPOXIDE HCL 25 MG PO CAPS
25.0000 mg | ORAL_CAPSULE | Freq: Four times a day (QID) | ORAL | Status: AC | PRN
  Administered 2012-09-22: 25 mg via ORAL
  Filled 2012-09-22: qty 1

## 2012-09-22 MED ORDER — ENSURE COMPLETE PO LIQD
237.0000 mL | Freq: Two times a day (BID) | ORAL | Status: DC
  Administered 2012-09-22 – 2012-09-24 (×5): 237 mL via ORAL

## 2012-09-22 NOTE — [facility] Suicide Risk Assessment (Signed)
Suicide Risk Assessment  Admission Assessment     Nursing information obtained from:  Patient Demographic factors:  Male;Divorced or widowed;Low socioeconomic status;Unemployed Current Mental Status:  NA Loss Factors:  Loss of significant relationship;Legal issues;Financial problems / change in socioeconomic status Historical Factors:  Prior suicide attempts;Family history of mental illness or substance abuse Risk Reduction Factors:  Sense of responsibility to family;Living with another person, especially a relative;Positive social support  CLINICAL FACTORS:   Alcohol/Substance Abuse/Dependencies, Mood Disorder NOS  COGNITIVE FEATURES THAT CONTRIBUTE TO RISK: None identified   SUICIDE RISK:   Mild:  Suicidal ideation of limited frequency, intensity, duration, and specificity.  There are no identifiable plans, no associated intent, mild dysphoria and related symptoms, good self-control (both objective and subjective assessment), few other risk factors, and identifiable protective factors, including available and accessible social support.  PLAN OF CARE: Supportive approach/coping skills/relapse prevention                               Detox, reassess co morbidities  I certify that inpatient services furnished can reasonably be expected to improve the patient's condition.  Veria Stradley A 09/22/2012, 12:32 PM

## 2012-09-22 NOTE — Progress Notes (Signed)
Nutrition Brief Note  Patient identified on the Malnutrition Screening Tool (MST) Report  Body mass index is 21.26 kg/(m^2). Patient's weight is WNL based on current BMI.   Patient reports that he was not eating prior to admit.  "Just drinking."  Stated that the Alcohol tood his appetite away.  UBW of 156 lbs.  Patient is now 92% of his IBW.  Current diet order is Low Sodium, good intake of meals at this time. Labs and medications reviewed.   Patient would like Ensure will order Ensure Complete po BID, each supplement provides 350 kcal and 13 grams of protein. Encouraged intake of meals and the importance of improving nutrition.  No further nutrition interventions warranted at this time. If further nutrition issues arise, please consult RD.   Joe Garcia, RD, LDN Clinical Inpatient Dietitian Pager:  [phone] Weekend and after hours pager:  [phone]

## 2012-09-22 NOTE — ED Notes (Signed)
Report called to Jane, RN at [facility]  
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
## 2012-09-22 NOTE — Progress Notes (Signed)
Pt reported his sleep as fair "I got in late" his appetite is improving energy low ability to pay attention as poor.  He rated both his depression and hopelessness a 5 and denied any anxiety on his self-inventory.  He has cravings agitation and chilling this morning but has improved as the progressed.  He finally took a shower after lunch and that seemed to help him. He denies any S/H ideation or A/V hallucinations.  He c/o headache at 0827 and was given tylenol which relieved his pain.  He is unsure of his follow up and has not participated in groups today.

## 2012-09-22 NOTE — BH Assessment (Signed)
[facility] Assessment Progress Note   Pt was accepted to [facility] by Donell Sievert to Dr. Dub Mikes.  Patient has signed voluntary support paperwork.  Dr. Patria Mane [facility]) was notified.  Patient will be transported by security.

## 2012-09-22 NOTE — ED Notes (Signed)
Preparing pt for transport.

## 2012-09-22 NOTE — Progress Notes (Signed)
[facility] LCSW Aftercare Discharge Planning Group Note  09/22/2012 8:35 AM  Participation Quality:  Did Not Attend   Clide Dales 09/22/2012, 8:37 AM

## 2012-09-22 NOTE — Plan of Care (Signed)
Problem: Ineffective individual coping Goal: STG: Patient will participate in after care plan Outcome: Not Met (add Reason) Patient admitted in early hours of the day, still sleeping at time of treatment team  Problem: Alteration in mood & ability to function due to Goal: LTG-Patient demonstrates decreased signs of withdrawal (Patient demonstrates decreased signs of withdrawal to the point the patient is safe to return home and continue treatment in an outpatient setting)  Outcome: Not Progressing Patient admitted during night, still sleeping  Problem: Alteration in mood Goal: LTG-Pt's behavior demonstrates decreased signs of depression (Patient's behavior demonstrates decreased signs of depression to the point the patient is safe to return home and continue treatment in an outpatient setting)  Outcome: Not Met (add Reason) Patient still in bed since admit, not participating in groups as yet

## 2012-09-22 NOTE — Progress Notes (Signed)
Psychoeducational Group Note  Date:  09/22/2012 Time:  1100  Group Topic/Focus:  Personal Choices and Values:   The focus of this group is to help patients assess and explore the importance of values in their lives, how their values affect their decisions, how they express their values and what opposes their expression.  Participation Level: Did Not Attend  Participation Quality:  Not Applicable  Affect:  Not Applicable  Cognitive:  Not Applicable  Insight:  Not Applicable  Engagement in Group: Not Applicable  Additional Comments:  Pt remained resting in bed during group.   Sharyn Lull 09/22/2012, 12:09 PM

## 2012-09-22 NOTE — Tx Team (Signed)
Interdisciplinary Treatment Plan Update (Adult)  Date: 09/22/2012  Time Reviewed: 9:36 AM   Progress in Treatment: Attending groups: No Participating in groups: No Taking medication as prescribed:  Yes Tolerating medication:  Yes Family/Significant othe contact made: No Patient understands diagnosis: Yes Discussing patient identified problems/goals with staff: Not as yet Medical problems stabilized or resolved:  Yes Denies suicidal/homicidal ideation: Unknown, reportedly came in with SI Patient has not harmed self or Others: Yes  New problem(s) identified: None Identified  Discharge Plan or Barriers:  CSW to assess for appropriate referrals; have not met patient as yet  Additional comments: N/A  Reason for Continuation of Hospitalization: Depression Medication stabilization Suicidal ideation Withdrawal symptoms   Estimated length of stay: 4-6 days  For review of initial/current patient goals, please see plan of care.  Attendees: Patient:     Family:     Physician:  Geoffery Lyons 09/22/2012 9:36 AM   Nursing:   Robbie Louis, RN 09/22/2012 9:36 AM   Clinical Social Worker Ronda Fairly 09/22/2012 9:36 AM   Other:  Roswell Miners, RN 09/22/2012 9:36 AM   Other:  Malachi Pro, PA 09/22/2012 9:36 AM   Other:  Jari Favre, Sherrie Sport PA Student 09/22/2012 9:36 AM   Other:   09/22/2012 9:36 AM    Scribe for Treatment Team:   Carney Bern, LCSWA  09/22/2012 9:36 AM

## 2012-09-22 NOTE — Progress Notes (Addendum)
[facility] LCSW Group Therapy  09/22/2012 1:15 PM Type of Therapy:  Group Therapy  Participation Level:  Did Not Attend   Joe Garcia 09/22/2012, 4:22 PM

## 2012-09-22 NOTE — Tx Team (Signed)
Initial Interdisciplinary Treatment Plan  PATIENT STRENGTHS: (choose at least two) Average or above average intelligence Capable of independent living General fund of knowledge Motivation for treatment/growth Supportive family/friends  PATIENT STRESSORS: Financial difficulties Legal issue Marital or family conflict Substance abuse   PROBLEM LIST: Problem List/Patient Goals Date to be addressed Date deferred Reason deferred Estimated date of resolution  Alcohol abuse      Risk for self harm      Depression      Family conflict-arguing with adult children      "I need help to stop drinking"                               DISCHARGE CRITERIA:  Ability to meet basic life and health needs Improved stabilization in mood, thinking, and/or behavior Motivation to continue treatment in a less acute level of care Verbal commitment to aftercare and medication compliance Withdrawal symptoms are absent or subacute and managed without 24-hour nursing intervention  PRELIMINARY DISCHARGE PLAN: Attend aftercare/continuing care group Attend 12-step recovery group Return to previous living arrangement  PATIENT/FAMIILY INVOLVEMENT: This treatment plan has been presented to and reviewed with the patient, Joe Garcia, and/or family member.  The patient and family have been given the opportunity to ask questions and make suggestions.  Jesus Genera [facility] 09/22/2012, 3:23 AM
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
## 2012-09-23 NOTE — Progress Notes (Signed)
[facility] LCSW Aftercare Discharge Planning Group Note  09/23/2012 8:45 AM  Participation Quality:  Appropriate  Affect:  Anxious, Appropriate, Depressed, Flat and Irritable  Cognitive:  Alert, Appropriate and Oriented  Insight:  Developing/Improving, Engaged, Improving, Lacking and Limited  Engagement in Group:  Developing/Improving  Modes of Intervention:  Discussion, Exploration, Socialization and Support  Summary of Progress/Problems: Pt denies both suicidal and homicidal ideation.  On a scale of 1 to 10 with ten being the most ever experienced, the patient rates depression at a 8 and anxiety at a 2. Patient reports he lives with two of his children and is court ordered to attend AA meetings.   Joe Garcia

## 2012-09-23 NOTE — Progress Notes (Signed)
Psychoeducational Group Note  Date:  09/23/2012 Time: 1100  Group Topic/Focus:  Overcoming Stress:   The focus of this group is to define stress and help patients assess their triggers.  Participation Level: Did Not Attend  Participation Quality:  Not Applicable  Affect:  Not Applicable  Cognitive:  Not Applicable  Insight:  Not Applicable  Engagement in Group: Not Applicable  Additional Comments:  Patient came to group for 5 minutes and then left stated he needed to go the bathroom and  Did not return.  Karleen Hampshire Brittini 09/23/2012, 3:48 PM

## 2012-09-23 NOTE — Progress Notes (Signed)
[facility] MD Progress Note  09/23/2012 3:21 PM DHILLON COMUNALE  MRN:  [id number] Subjective:  Hubert endorses that he got very overwhelmed. He started missing meetings and felt he was going to have to serve time and this created enough stress in which he could not cope and relapsed. He had not been able to stop. He admits he is still grieving the loss of his wife. Some of her last words for him was that he had to slow down his drinking. He still feels pretty anxious, struggling to be able to sleep. Diagnosis:  Alcohol Dependence, Withdrawal  ADL's:  Intact  Sleep: Poor  Appetite:  Poor  Suicidal Ideation:  Plan:  Denies Intent:  Denies Means:  Denies Homicidal Ideation:  Plan:  Denies Intent:  Denies Means:  Denies AEB (as evidenced by):  Psychiatric Specialty Exam: Review of Systems  Constitutional: Negative.   HENT: Negative.   Eyes: Negative.   Respiratory: Negative.   Cardiovascular: Negative.   Gastrointestinal: Positive for abdominal pain.  Genitourinary: Negative.   Musculoskeletal: Negative.   Skin: Negative.   Neurological: Negative.   Endo/Heme/Allergies: Negative.   Psychiatric/Behavioral: Positive for depression and substance abuse. The patient is nervous/anxious and has insomnia.     Blood pressure 144/78, pulse 67, temperature 98.1 F (36.7 C), temperature source Oral, resp. rate 16, height 5\' 9"  (1.753 m), weight 65.318 kg (144 lb).Body mass index is 21.26 kg/(m^2).  General Appearance: Disheveled  Eye Solicitor::  Fair  Speech:  Clear and Coherent, Slow and not spontaneous  Volume:  Decreased  Mood:  Anxious, Depressed and worried  Affect:  anxious, worried  Thought Process:  Coherent and Goal Directed  Orientation:  Full (Time, Place, and Person)  Thought Content:  Rumination and worries, concerns  Suicidal Thoughts:  No  Homicidal Thoughts:  No  Memory:  Immediate;   Fair Recent;   Fair Remote;   Fair  Judgement:  Fair  Insight:  Present  Psychomotor  Activity:  Restlessness  Concentration:  Fair  Recall:  Fair  Akathisia:  No  Handed:  Right  AIMS (if indicated):     Assets:  Desire for Improvement  Sleep:  Number of Hours: 6   Current Medications: Current Facility-Administered Medications  Medication Dose Route Frequency Provider Last Rate Last Dose  . acetaminophen (TYLENOL) tablet 650 mg  650 mg Oral Q6H PRN Kerry Hough, PA   650 mg at 09/22/12 0827  . albuterol (PROVENTIL HFA;VENTOLIN HFA) 108 (90 BASE) MCG/ACT inhaler 2 puff  2 puff Inhalation Q6H PRN Kerry Hough, PA      . alum & mag hydroxide-simeth (MAALOX/MYLANTA) 200-200-20 MG/5ML suspension 30 mL  30 mL Oral Q4H PRN Kerry Hough, PA      . chlordiazePOXIDE (LIBRIUM) capsule 25 mg  25 mg Oral Q6H PRN Kerry Hough, PA   25 mg at 09/22/12 0223  . chlordiazePOXIDE (LIBRIUM) capsule 25 mg  25 mg Oral TID Kerry Hough, PA       Followed by  . [START ON 09/24/2012] chlordiazePOXIDE (LIBRIUM) capsule 25 mg  25 mg Oral BH-qamhs Kerry Hough, PA       Followed by  . [START ON 09/26/2012] chlordiazePOXIDE (LIBRIUM) capsule 25 mg  25 mg Oral Daily Kerry Hough, PA      . cloNIDine (CATAPRES - Dosed in mg/24 hr) patch 0.1 mg  0.1 mg Transdermal Weekly Kerry Hough, PA   0.1 mg at 09/22/12 0228  .  feeding supplement (ENSURE COMPLETE) liquid 237 mL  237 mL Oral BID BM Jeoffrey Massed, RD   237 mL at 09/23/12 1417  . hydrOXYzine (ATARAX/VISTARIL) tablet 25 mg  25 mg Oral Q6H PRN Kerry Hough, PA      . loperamide (IMODIUM) capsule 2-4 mg  2-4 mg Oral PRN Kerry Hough, PA      . magnesium hydroxide (MILK OF MAGNESIA) suspension 30 mL  30 mL Oral Daily PRN Kerry Hough, PA      . multivitamin with minerals tablet 1 tablet  1 tablet Oral Daily Kerry Hough, PA   1 tablet at 09/23/12 [phone]  . ondansetron (ZOFRAN-ODT) disintegrating tablet 4 mg  4 mg Oral Q6H PRN Kerry Hough, PA      . thiamine (B-1) injection 100 mg  100 mg Intramuscular Once Kerry Hough, PA      . thiamine (VITAMIN B-1) tablet 100 mg  100 mg Oral Daily Kerry Hough, PA   100 mg at 09/23/12 2130  . traZODone (DESYREL) tablet 50 mg  50 mg Oral QHS,MR X 1 Kerry Hough, PA   50 mg at 09/22/12 2204    Lab Results: No results found for this or any previous visit (from the past 48 hour(s)).  Physical Findings: AIMS: Facial and Oral Movements Muscles of Facial Expression: None, normal Lips and Perioral Area: None, normal Jaw: None, normal Tongue: None, normal,Extremity Movements Upper (arms, wrists, hands, fingers): None, normal Lower (legs, knees, ankles, toes): None, normal, Trunk Movements Neck, shoulders, hips: None, normal, Overall Severity Severity of abnormal movements (highest score from questions above): None, normal Incapacitation due to abnormal movements: None, normal Patient's awareness of abnormal movements (rate only patient's report): No Awareness, Dental Status Current problems with teeth and/or dentures?: Yes Does patient usually wear dentures?: No  CIWA:  CIWA-Ar Total: 0 COWS:     Treatment Plan Summary: Daily contact with patient to assess and evaluate symptoms and progress in treatment Medication management  Plan: Supportive approach/coping skills/relapse prevention           Complete detox/suggest a residential treatment program as he has not been able to maintain sobriety   Medical Decision Making Problem Points:  Review of psycho-social stressors (1) Data Points:  Review of medication regiment & side effects (2)  I certify that inpatient services furnished can reasonably be expected to improve the patient's condition.   Caden Fatica A 09/23/2012, 3:21 PM

## 2012-09-23 NOTE — Progress Notes (Addendum)
[facility] LCSW Group Therapy  09/23/2012   Type of Therapy:  Group Therapy 1:15 to 2:30  Participation Level:  Minimal  Participation Quality:   Drowsy  Affect:  Flat   Cognitive:  Alert and Oriented   Insight:  None shared  Engagement in Therapy:  Limited   Modes of Intervention:  Discussion, Exploration, Problem-solving, Rapport Building, Socialization and Support  Summary of Progress/Problems:  [employer] [facility] [place] representative was to give presentation today yet was a no show.  CSW provided discussion on how to get a sponsor, find a home group, and answered basic questions about AA and NA membership.  Patient were able to process some of their fears and or experiences.  Much of the discussion centered of fear of getting and staying clean and being vunerable by letting others know what is going on.  Patient choose not to share yet was attentive/   Dyane Dustman, Julious Payer
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
## 2012-09-23 NOTE — Progress Notes (Signed)
Patient in day room at the beginning of the shift, no interactions noted with peers. His mood and affect flat and depressed, He brought in a form and requested staff to sign it so he could take it to court . Writer consulted with [facility] about this form and he said, it's not something that we sign here, and that patient should show the form to the AA staff tomorrow to sign the document for him. Writer returned form to patient and encouraged him to talk to AA staff tomorrow  About signing the form.He received his HS medication without difficulty. Q 15 minute check continues for safety.

## 2012-09-23 NOTE — Progress Notes (Signed)
Recreation Therapy Notes   Date: 02.27.2014 Time: 3:00pm Location: [address] Day Room      Group Topic/Focus: Leisure Education  Participation Level: Active  Participation Quality: Appropriate  Affect: Appropriate  Cognitive: Appropriate   Additional Comments:  Patient chose not to create his own Leisure Alphabet, however patient did contribute to group Leisure Alphabet. Patient with peers created group leisure alphabet. Patient activity participated in group discussion about making healthy leisure and recreation choices.  Marykay Lex Benancio Osmundson, LRT/CTRS    Jearl Klinefelter 09/23/2012 3:52 PM

## 2012-09-23 NOTE — Progress Notes (Signed)
D:  Patient's self inventory sheet, patient has poor sleep, improving appetite, normal energy level, improving attention span.  Rated depression #4, anxiety #7.  Denied hopelessness.  Has experienced chilling and tremors in past 24 hours.   Denied SI.  Has experienced occasional hip pain.  Zero pain goal, worst pain #5.  After discharge, plans to "sleep all the time".  Denied SI and HI.   Denied A/V hallucinations.  Plans are to go home after discharge.   Needs assistance with medications after discharge. A:  Medications administered per MD order.  Support and encouragement given throughout day. R:  Following treatment plan.  Denied SI and HI.  Denied A/V hallucinations.  Contracts for safety.

## 2012-09-24 MED ORDER — LISINOPRIL 10 MG PO TABS
10.0000 mg | ORAL_TABLET | Freq: Every day | ORAL | Status: DC
  Filled 2012-09-24 (×2): qty 1

## 2012-09-24 NOTE — Progress Notes (Signed)
[facility] INPATIENT:  Family/Significant Other Suicide Prevention Education  Suicide Prevention Education:  Contact Attempts: Clyde Lundborg at [phone]  has been identified by the patient as the family member/significant other with whom the patient will be residing, and identified as the person(s) who will aid the patient in the event of a mental health crisis.  With written consent from the patient, two attempts were made to provide suicide prevention education, prior to and/or following the patient's discharge.  We were unsuccessful in providing suicide prevention education.  A suicide education pamphlet was given to the patient to share with family/significant other.  Date and time of first attempt: 09/23/12 5:30PM Date and time of second attempt:09/24/2012 /3:00 PM   Writer provided suicide prevention education directly to patient; conversation included risk factors, warning signs and resources to contact for help. Mobile crisis services explained and contact card placed in chart for pt to receive at discharge.  Clide Dales 09/24/2012, 3:21 PM

## 2012-09-24 NOTE — Progress Notes (Signed)
Patient did attend the evening karaoke group. Pt was attentive and supportive.   

## 2012-09-24 NOTE — Progress Notes (Signed)
[facility] LCSW Aftercare Discharge Planning Group Note  09/24/2012 8:45 AM  Participation Quality:  Did Not Attend   Joe Garcia 09/24/2012, 4:42 PM

## 2012-09-24 NOTE — Progress Notes (Signed)
[facility] Adult Case Management Discharge Plan :  Will you be returning to the same living situation after discharge: Yes,  with adult children  At discharge, do you have transportation home?:Yes,  bus vouchers Do you have the ability to pay for your medications: Not applicable  Release of information consent forms completed and in the chart;  Patient's signature needed at discharge.  Patient to Follow up at: Follow-up Information   [facility]. (Call and speak to case worker, Angelina Pih day of discharge; they will make arrangements to pick you up for next session)    Contact information:   [address] [place], [place]  [address] ph [phone]      Follow up with [facility] On 10/05/2012. (Go to [facility] on 10/05/12 no later than 8 am with medications and clothing you will need in hand for 1 month or call by 2/7 if bed will not be needed)    Contact information:   [address] [place] [address] [facility] [phone]       Patient denies SI/HI:   Yes,  denies both    Safety Planning and Suicide Prevention discussed:  Yes,  in discharge planning group on 2/26  Clide Dales 09/24/2012, 3:02 PM

## 2012-09-24 NOTE — Progress Notes (Signed)
D   Pt has been appropriate and pleasant   he denies suicidal and homicidal ideation    He denies any signs and symptoms of withdrawal  He interacts appropriately with select others   He is calm and cooperative A   Verbal support given  Medications administered and effectiveness monitored  Q 15 min checks R   Pt safe at present

## 2012-09-24 NOTE — Progress Notes (Signed)
D: Patient denies SI/HI and A/V hallucinations; patient reports sleep to be well; reports appetite to be good ; reports energy level is normal; reports ability to pay attention is good; rates depression as 4/10; rates hopelessness 0/10; rates anxiety as 4/10;   A: Monitored q 15 minutes; patient encouraged to attend groups; patient educated about medications; patient given medications per physician orders; patient encouraged to express feelings and/or concerns  R: Patient remains is appropriate and cooperative; patient's interaction with staff and peers is appropriate.;patient is taking medications as prescribed and tolerating medications; patient is attending all groups

## 2012-09-24 NOTE — Progress Notes (Signed)
Patient ID: Joe Garcia, male   DOB: [birth year], 62 y.o.   MRN: [id number] [facility] MD Progress Note  09/24/2012 10:36 AM AARONMICHAEL BRUMBAUGH  MRN:  [id number]  Subjective:  Joe Garcia endorses that he is feeling great today actually. Denies feeling depressed and or anxious. States that he slept well last night, Denies any suicidal, homicidal ideations, auditory, visual hallucinations and or delusional thoughts.  O:    Diagnosis:  Alcohol Dependence, Withdrawal  ADL's:  Intact  Sleep: Poor  Appetite:  Poor  Suicidal Ideation:  Plan:  Denies Intent:  Denies Means:  Denies Homicidal Ideation:  Plan:  Denies Intent:  Denies Means:  Denies  AEB (as evidenced by): Per patient's reports.  Psychiatric Specialty Exam: Review of Systems  Constitutional: Negative.   HENT: Negative.   Eyes: Negative.   Respiratory: Negative.   Cardiovascular: Negative.   Gastrointestinal: Positive for abdominal pain.  Genitourinary: Negative.   Musculoskeletal: Negative.   Skin: Negative.   Neurological: Negative.   Endo/Heme/Allergies: Negative.   Psychiatric/Behavioral: Positive for depression and substance abuse. The patient is nervous/anxious and has insomnia.     Blood pressure 138/85, pulse 91, temperature 96.8 F (36 C), temperature source Oral, resp. rate 20, height 5\' 9"  (1.753 m), weight 65.318 kg (144 lb).Body mass index is 21.26 kg/(m^2).  General Appearance: Disheveled, malodorous  Eye Contact::  Fair  Speech:  Clear and Coherent, Slow and not spontaneous  Volume:  Decreased  Mood:  "I'm feeling great today actually"  Affect:  Appropriate, congruent with mood.  Thought Process:  Coherent and Goal Directed  Orientation:  Full (Time, Place, and Person)  Thought Content:  Rumination  Suicidal Thoughts:  No  Homicidal Thoughts:  No  Memory:  Immediate;   Fair Recent;   Fair Remote;   Fair  Judgement:  Fair  Insight:  Present  Psychomotor Activity:  Calm  Concentration:  Fair   Recall:  Fair  Akathisia:  No  Handed:  Right  AIMS (if indicated):     Assets:  Desire for Improvement  Sleep:  Number of Hours: 5.5   Current Medications: Current Facility-Administered Medications  Medication Dose Route Frequency Provider Last Rate Last Dose  . acetaminophen (TYLENOL) tablet 650 mg  650 mg Oral Q6H PRN Kerry Hough, PA   650 mg at 09/22/12 0827  . albuterol (PROVENTIL HFA;VENTOLIN HFA) 108 (90 BASE) MCG/ACT inhaler 2 puff  2 puff Inhalation Q6H PRN Kerry Hough, PA      . alum & mag hydroxide-simeth (MAALOX/MYLANTA) 200-200-20 MG/5ML suspension 30 mL  30 mL Oral Q4H PRN Kerry Hough, PA      . chlordiazePOXIDE (LIBRIUM) capsule 25 mg  25 mg Oral Q6H PRN Kerry Hough, PA   25 mg at 09/22/12 0223  . chlordiazePOXIDE (LIBRIUM) capsule 25 mg  25 mg Oral TID Kerry Hough, PA   25 mg at 09/24/12 1324   Followed by  . chlordiazePOXIDE (LIBRIUM) capsule 25 mg  25 mg Oral BH-qamhs Kerry Hough, PA       Followed by  . [START ON 09/26/2012] chlordiazePOXIDE (LIBRIUM) capsule 25 mg  25 mg Oral Daily Kerry Hough, PA      . cloNIDine (CATAPRES - Dosed in mg/24 hr) patch 0.1 mg  0.1 mg Transdermal Weekly Kerry Hough, PA   0.1 mg at 09/22/12 0228  . feeding supplement (ENSURE COMPLETE) liquid 237 mL  237 mL Oral BID BM Jeoffrey Massed, RD  237 mL at 09/24/12 1001  . hydrOXYzine (ATARAX/VISTARIL) tablet 25 mg  25 mg Oral Q6H PRN Kerry Hough, PA      . loperamide (IMODIUM) capsule 2-4 mg  2-4 mg Oral PRN Kerry Hough, PA      . magnesium hydroxide (MILK OF MAGNESIA) suspension 30 mL  30 mL Oral Daily PRN Kerry Hough, PA      . multivitamin with minerals tablet 1 tablet  1 tablet Oral Daily Kerry Hough, PA   1 tablet at 09/24/12 6578  . ondansetron (ZOFRAN-ODT) disintegrating tablet 4 mg  4 mg Oral Q6H PRN Kerry Hough, PA      . thiamine (B-1) injection 100 mg  100 mg Intramuscular Once Kerry Hough, PA      . thiamine (VITAMIN B-1) tablet  100 mg  100 mg Oral Daily Kerry Hough, PA   100 mg at 09/24/12 4696  . traZODone (DESYREL) tablet 50 mg  50 mg Oral QHS,MR X 1 Kerry Hough, PA   50 mg at 09/23/12 2303    Lab Results: No results found for this or any previous visit (from the past 48 hour(s)).  Physical Findings: AIMS: Facial and Oral Movements Muscles of Facial Expression: None, normal Lips and Perioral Area: None, normal Jaw: None, normal Tongue: None, normal,Extremity Movements Upper (arms, wrists, hands, fingers): None, normal Lower (legs, knees, ankles, toes): None, normal, Trunk Movements Neck, shoulders, hips: None, normal, Overall Severity Severity of abnormal movements (highest score from questions above): None, normal Incapacitation due to abnormal movements: None, normal Patient's awareness of abnormal movements (rate only patient's report): No Awareness, Dental Status Current problems with teeth and/or dentures?: No Does patient usually wear dentures?: No  CIWA:  CIWA-Ar Total: 0 COWS:  COWS Total Score: 0  Treatment Plan Summary: Daily contact with patient to assess and evaluate symptoms and progress in treatment Medication management  Plan: Supportive approach/coping skills/relapse prevention. Patient is a possible discharge in am. Complete detox/suggest a residential treatment program as he has not been able to maintain sobriety.   Medical Decision Making Problem Points:  Review of psycho-social stressors (1) Data Points:  Review of medication regiment & side effects (2)  I certify that inpatient services furnished can reasonably be expected to improve the patient's condition.   [name] I 09/24/2012, 10:36 AM

## 2012-09-24 NOTE — Progress Notes (Signed)
[facility] LCSW Group Therapy  09/24/2012 1:15 PM  Type of Therapy:  Group Therapy  Participation Level:  Active  Participation Quality:  Appropriate, Attentive and Sharing  Affect:  Appropriate  Cognitive:  Appropriate  Insight:  Engaged, Improving  Engagement in Therapy:  Engaged  Modes of Intervention:  Discussion, Education, Exploration, Socialization and Support  Summary of Progress/Problems:  Group discussion focused on feelings about relapse; group members were able to process their shame guilt and frustration. Educational piece followed on Post Acute Withdrawal Syndrome (PAWS).  Tanner shared that "PAWS sounds a little bit better than jail, but not much."   Harrill, Julious Payer

## 2012-09-24 NOTE — Progress Notes (Signed)
Adult Psychoeducational Group Note  Date:  09/24/2012 Time:  11:22 PM  Group Topic/Focus:  AA group  Participation Level:  Active  Participation Quality:  Appropriate  Affect:  Appropriate  Cognitive:  Alert  Insight: Appropriate  Engagement in Group:  Engaged  Modes of Intervention:  Discussion  Additional Comments:    Flonnie Hailstone 09/24/2012, 11:22 PM
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
## 2012-09-25 ENCOUNTER — Encounter (HOSPITAL_COMMUNITY): Payer: Self-pay | Admitting: Registered Nurse

## 2012-09-25 DIAGNOSIS — F1020 Alcohol dependence, uncomplicated: Secondary | ICD-10-CM

## 2012-09-25 DIAGNOSIS — F1010 Alcohol abuse, uncomplicated: Secondary | ICD-10-CM | POA: Diagnosis present

## 2012-09-25 DIAGNOSIS — R45851 Suicidal ideations: Secondary | ICD-10-CM

## 2012-09-25 MED ORDER — CLONIDINE HCL 0.1 MG/24HR TD PTWK: 1.0000 | MEDICATED_PATCH | TRANSDERMAL | Status: DC

## 2012-09-25 MED ORDER — CLONIDINE HCL 0.1 MG/24HR TD PTWK: 4.0000 | MEDICATED_PATCH | TRANSDERMAL | Status: DC

## 2012-09-25 MED ORDER — IBUPROFEN 200 MG PO TABS: 800.0000 mg | ORAL_TABLET | Freq: Three times a day (TID) | ORAL | Status: DC | PRN

## 2012-09-25 NOTE — Clinical Social Work Note (Signed)
[facility] Group Notes:  (Clinical Social Work)  09/25/2012     10-11AM  Summary of Progress/Problems:   The main focus of today's process group was for the patient to identify ways in which they have in the past sabotaged their own recovery. Motivational Interviewing was utilized to ask the group members what they get out of their substance use, and what they want to change.  The Stages of Change were explained, and members identified where they currently are with regard to stages of change.  The patient expressed that he drinks alcohol to "have a good time."  He stated this is his first time sober, and his motivation is at 5 out of 10 because he does not know how to maintain sobriety.  He stated he has learned a lot while in the hospital, and that he will be going to [employer], in fact plans to go to an NA meeting tonight.  Type of Therapy:  Group Therapy - Process   Participation Level:  Active  Participation Quality:  Attentive and Sharing  Affect:  Appropriate  Cognitive:  Oriented  Insight:  Engaged  Engagement in Therapy:  Engaged  Modes of Intervention:  Education, Teacher, English as a foreign language, Exploration, Discussion, Motivational Interviewing   Ambrose Mantle, LCSW 09/25/2012, 12:21 PM
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
## 2012-09-25 NOTE — Progress Notes (Signed)
D/C instructions/meds/follow-up appointments reviewed, pt verbalized understanding, pt's belongings returned to pt, samples given.D/C instructions/meds/follow-up appointments reviewed, pt verbalized understanding, pt's belongings returned to pt, samples given. 

## 2012-09-25 NOTE — Progress Notes (Signed)
Adult Psychoeducational Group Note  Date:  09/25/2012 Time:  9:56 AM  Group Topic/Focus:  Identifying Needs:   The focus of this group is to help patients identify their personal needs that have been historically problematic and identify healthy behaviors to address their needs.  Self inventory review.  Participation Level:  Active  Participation Quality:  Appropriate  Affect:  Appropriate and Excited  Cognitive:  Alert and Appropriate  Insight: Appropriate and Good  Engagement in Group:  Developing/Improving and Engaged  Modes of Intervention:  Discussion, Education and Support  Additional Comments:  Pt expressed his excitement about being discharged today and has hope for his sobriety.  Pt plans to attend a 12 step program and take his recovery "day by day".  Joe Garcia 09/25/2012, 9:56 AM
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
## 2012-09-29 NOTE — Progress Notes (Signed)
Patient Discharge Instructions:  After Visit Summary (AVS):   Faxed to:  09/29/12 Discharge Summary Note:   Faxed to:  09/29/12 Psychiatric Admission Assessment Note:   Faxed to:  09/29/12 Suicide Risk Assessment - Discharge Assessment:   Faxed to:  09/29/12 Faxed/Sent to the Next Level Care provider:  09/29/12 Faxed to [facility] @ [phone] Faxed to [facility] @ [phone]  Jerelene Redden, 09/29/2012, 2:03 PM

## 2012-10-27 ENCOUNTER — Encounter (HOSPITAL_COMMUNITY): Payer: Self-pay | Admitting: *Deleted

## 2012-10-27 ENCOUNTER — Emergency Department (HOSPITAL_COMMUNITY)
Admission: EM | Admit: 2012-10-27 | Discharge: 2012-10-29 | Disposition: A | Discharge status: Other Acute Inpatient Hospital | Payer: Medicaid Other | Attending: Emergency Medicine | Admitting: Emergency Medicine

## 2012-10-27 DIAGNOSIS — J45909 Unspecified asthma, uncomplicated: Secondary | ICD-10-CM | POA: Insufficient documentation

## 2012-10-27 DIAGNOSIS — Z87891 Personal history of nicotine dependence: Secondary | ICD-10-CM | POA: Insufficient documentation

## 2012-10-27 DIAGNOSIS — F1020 Alcohol dependence, uncomplicated: Secondary | ICD-10-CM | POA: Insufficient documentation

## 2012-10-27 DIAGNOSIS — Z79899 Other long term (current) drug therapy: Secondary | ICD-10-CM | POA: Insufficient documentation

## 2012-10-27 DIAGNOSIS — Z8659 Personal history of other mental and behavioral disorders: Secondary | ICD-10-CM | POA: Insufficient documentation

## 2012-10-27 DIAGNOSIS — I10 Essential (primary) hypertension: Secondary | ICD-10-CM | POA: Insufficient documentation

## 2012-10-27 LAB — CBC WITH DIFFERENTIAL/PLATELET
Basophils Absolute: 0 10*3/uL (ref 0.0–0.1)
HCT: 36.5 % — ABNORMAL LOW (ref 39.0–52.0)
Lymphocytes Relative: 55 % — ABNORMAL HIGH (ref 12–46)
Lymphs Abs: 2.7 10*3/uL (ref 0.7–4.0)
MCV: 88 fL (ref 78.0–100.0)
Monocytes Absolute: 0.3 10*3/uL (ref 0.1–1.0)
Neutro Abs: 1.9 10*3/uL (ref 1.7–7.7)
Platelets: 245 10*3/uL (ref 150–400)
RBC: 4.15 MIL/uL — ABNORMAL LOW (ref 4.22–5.81)
RDW: 13.8 % (ref 11.5–15.5)
WBC: 5 10*3/uL (ref 4.0–10.5)

## 2012-10-27 LAB — COMPREHENSIVE METABOLIC PANEL
ALT: 13 U/L (ref 0–53)
AST: 24 U/L (ref 0–37)
CO2: 22 mEq/L (ref 19–32)
Chloride: 103 mEq/L (ref 96–112)
GFR calc Af Amer: >90 mL/min (ref >90)
GFR calc non Af Amer: 80 mL/min — ABNORMAL LOW (ref >90)
Glucose, Bld: 88 mg/dL (ref 70–99)
Sodium: 139 mEq/L (ref 135–145)
Total Bilirubin: 0.6 mg/dL (ref 0.3–1.2)

## 2012-10-27 LAB — RAPID URINE DRUG SCREEN, HOSP PERFORMED
Barbiturates: NOT DETECTED
Tetrahydrocannabinol: NOT DETECTED

## 2012-10-27 MED ORDER — ZOLPIDEM TARTRATE 5 MG PO TABS: 5.0000 mg | ORAL_TABLET | Freq: Every evening | ORAL | Status: DC | PRN

## 2012-10-27 MED ORDER — ONDANSETRON HCL 8 MG PO TABS
4.0000 mg | ORAL_TABLET | Freq: Three times a day (TID) | ORAL | Status: DC | PRN
  Administered 2012-10-28 (×2): 4 mg via ORAL
  Filled 2012-10-27 (×2): qty 1

## 2012-10-27 MED ORDER — IBUPROFEN 200 MG PO TABS: 600.0000 mg | ORAL_TABLET | Freq: Three times a day (TID) | ORAL | Status: DC | PRN

## 2012-10-27 MED ORDER — ACETAMINOPHEN 325 MG PO TABS: 650.0000 mg | ORAL_TABLET | ORAL | Status: DC | PRN

## 2012-10-27 MED ORDER — LORAZEPAM 1 MG PO TABS
1.0000 mg | ORAL_TABLET | Freq: Three times a day (TID) | ORAL | Status: DC | PRN
  Administered 2012-10-28 (×2): 1 mg via ORAL
  Filled 2012-10-27 (×2): qty 1
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
## 2012-10-27 NOTE — ED Notes (Signed)
Pt attempting to give money to staff to go buy him a beer. Pt told this is inappropriate and the staff can't give him any alcoholic beverages.
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
## 2012-10-27 NOTE — ED Notes (Signed)
Pt reports being sober x 3 weeks and then started drinking again and he "fell out" so his mentor told him to come here for detox.

## 2012-10-27 NOTE — ED Notes (Signed)
Pt given blue scrubs to put on and wanded by security.

## 2012-10-28 NOTE — BH Assessment (Signed)
[facility] Assessment Progress Note      Pt accepted to [facility] by Fransisca Kaufmann to Dr Daleen Bo room 302-2.  Support paperwork complete.  EDP and ED staff notified.

## 2012-10-28 NOTE — ED Notes (Signed)
Pt. Has a team of behavioral counselors that are also following the pt.  Joe Garcia  Is here and visiting the pt.  Her number is [phone].

## 2012-10-28 NOTE — BH Assessment (Signed)
[facility] Assessment Progress Note      Update:  Called [facility] to check bed status and possible beds per [facility] @ 1246.  Referral faxed for review.  Called [facility] and pt's MCD out of network for them because he is out of the age rance per [facility] @ 1246.  Called [facility] and left message @ 1248.  Referral faxed for review.  Called [place] and no beds per [facility].  Called [place] and no beds per Lupita Leash @ 1249.  Called [facility] and no beds per [facility] @ 1250.  Called [facility] and no beds per [employer] [facility], but can send referral for possible wait list.  Referral faxed for review.

## 2012-10-28 NOTE — ED Notes (Signed)
Updated pt. ;'s status to [facility] and pt. Has not been accepted at present time.  When he does become accepted into a program.  She would like to be updated., along with her Team [employer] [phone]

## 2012-10-28 NOTE — [facility] Counselor (Signed)
[name], assessment counselor at [facility], submitted Pt for admission to [facility]. Thurman Coyer, [facility] confirmed bed availability. Fransisca Kaufmann, NP reviewed clinical information and accepted Pt to the service of Dr. Henrietta Dine, room 302-2. Notified Peri Maris of admission.  Harlin Rain Patsy Baltimore, LPC, [facility] Assessment Counselor

## 2012-10-28 NOTE — ED Notes (Signed)
Report given to lori,rn
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
## 2012-10-28 NOTE — ED Notes (Signed)
Pt up to bathroom to vomit.  Pt asked if he would like to have something for nausea but pt states no he does not want anything at this time.  Pt back to room lying on bed. nad.

## 2012-10-28 NOTE — ED Notes (Signed)
Pt. Will be getting a shower in the am  .  Pt. Is watching TV .

## 2012-10-29 ENCOUNTER — Inpatient Hospital Stay (HOSPITAL_COMMUNITY): Admission: AD | Admit: 2012-10-29 | Payer: Self-pay | Source: Intra-hospital | Admitting: Psychiatry

## 2012-10-29 ENCOUNTER — Encounter (HOSPITAL_COMMUNITY): Payer: Self-pay

## 2012-10-29 ENCOUNTER — Inpatient Hospital Stay (HOSPITAL_COMMUNITY)
Admission: AD | Admit: 2012-10-29 | Discharge: 2012-11-01 | DRG: 897 | Disposition: A | Discharge status: Home / Self Care | Payer: Medicaid Other | Source: Ambulatory Visit | Attending: Emergency Medicine | Admitting: Emergency Medicine

## 2012-10-29 DIAGNOSIS — M8700 Idiopathic aseptic necrosis of unspecified bone: Secondary | ICD-10-CM

## 2012-10-29 DIAGNOSIS — F10939 Alcohol use, unspecified with withdrawal, unspecified: Principal | ICD-10-CM | POA: Diagnosis present

## 2012-10-29 DIAGNOSIS — T783XXA Angioneurotic edema, initial encounter: Secondary | ICD-10-CM

## 2012-10-29 DIAGNOSIS — Z79899 Other long term (current) drug therapy: Secondary | ICD-10-CM

## 2012-10-29 DIAGNOSIS — R45851 Suicidal ideations: Secondary | ICD-10-CM

## 2012-10-29 DIAGNOSIS — F1010 Alcohol abuse, uncomplicated: Secondary | ICD-10-CM

## 2012-10-29 DIAGNOSIS — IMO0002 Reserved for concepts with insufficient information to code with codable children: Secondary | ICD-10-CM

## 2012-10-29 DIAGNOSIS — F1020 Alcohol dependence, uncomplicated: Secondary | ICD-10-CM | POA: Diagnosis present

## 2012-10-29 DIAGNOSIS — J45909 Unspecified asthma, uncomplicated: Secondary | ICD-10-CM | POA: Diagnosis present

## 2012-10-29 DIAGNOSIS — F10239 Alcohol dependence with withdrawal, unspecified: Principal | ICD-10-CM

## 2012-10-29 DIAGNOSIS — I10 Essential (primary) hypertension: Secondary | ICD-10-CM | POA: Diagnosis present

## 2012-10-29 DIAGNOSIS — F1994 Other psychoactive substance use, unspecified with psychoactive substance-induced mood disorder: Secondary | ICD-10-CM

## 2012-10-29 MED ORDER — VITAMIN B-1 100 MG PO TABS
100.0000 mg | ORAL_TABLET | Freq: Every day | ORAL | Status: DC
  Administered 2012-10-30 – 2012-11-01 (×3): 100 mg via ORAL
  Filled 2012-10-29 (×5): qty 1

## 2012-10-29 MED ORDER — ADULT MULTIVITAMIN W/MINERALS CH
1.0000 | ORAL_TABLET | Freq: Every day | ORAL | Status: DC
  Administered 2012-10-29 – 2012-11-01 (×4): 1 via ORAL
  Filled 2012-10-29 (×6): qty 1

## 2012-10-29 MED ORDER — ALUM & MAG HYDROXIDE-SIMETH 200-200-20 MG/5ML PO SUSP: 30.0000 mL | ORAL | Status: DC | PRN

## 2012-10-29 MED ORDER — MAGNESIUM HYDROXIDE 400 MG/5ML PO SUSP: 30.0000 mL | Freq: Every day | ORAL | Status: DC | PRN

## 2012-10-29 MED ORDER — TRAZODONE HCL 50 MG PO TABS
50.0000 mg | ORAL_TABLET | Freq: Every evening | ORAL | Status: DC | PRN
  Administered 2012-10-29 – 2012-10-31 (×2): 50 mg via ORAL
  Filled 2012-10-29 (×10): qty 1

## 2012-10-29 MED ORDER — CHLORDIAZEPOXIDE HCL 25 MG PO CAPS
25.0000 mg | ORAL_CAPSULE | Freq: Three times a day (TID) | ORAL | Status: AC
  Administered 2012-10-30 (×3): 25 mg via ORAL
  Filled 2012-10-29 (×4): qty 1

## 2012-10-29 MED ORDER — HYDROXYZINE HCL 25 MG PO TABS
25.0000 mg | ORAL_TABLET | Freq: Four times a day (QID) | ORAL | Status: AC | PRN
  Administered 2012-10-30: 25 mg via ORAL

## 2012-10-29 MED ORDER — IBUPROFEN 600 MG PO TABS: 600.0000 mg | ORAL_TABLET | Freq: Four times a day (QID) | ORAL | Status: DC | PRN

## 2012-10-29 MED ORDER — CHLORDIAZEPOXIDE HCL 25 MG PO CAPS: 25.0000 mg | ORAL_CAPSULE | Freq: Four times a day (QID) | ORAL | Status: AC | PRN

## 2012-10-29 MED ORDER — CHLORDIAZEPOXIDE HCL 25 MG PO CAPS: 25.0000 mg | ORAL_CAPSULE | Freq: Three times a day (TID) | ORAL | Status: DC

## 2012-10-29 MED ORDER — LISINOPRIL 20 MG PO TABS
ORAL_TABLET | ORAL | Status: AC
  Filled 2012-10-29: qty 1

## 2012-10-29 MED ORDER — CHLORDIAZEPOXIDE HCL 25 MG PO CAPS
25.0000 mg | ORAL_CAPSULE | Freq: Once | ORAL | Status: AC
  Administered 2012-10-29: 25 mg via ORAL
  Filled 2012-10-29: qty 1

## 2012-10-29 MED ORDER — LISINOPRIL 20 MG PO TABS
20.0000 mg | ORAL_TABLET | Freq: Once | ORAL | Status: AC
  Administered 2012-10-29: 20 mg via ORAL
  Filled 2012-10-29: qty 1

## 2012-10-29 MED ORDER — CHLORDIAZEPOXIDE HCL 25 MG PO CAPS: 25.0000 mg | ORAL_CAPSULE | Freq: Every day | ORAL | Status: DC

## 2012-10-29 MED ORDER — ACETAMINOPHEN 325 MG PO TABS: 650.0000 mg | ORAL_TABLET | Freq: Four times a day (QID) | ORAL | Status: DC | PRN

## 2012-10-29 MED ORDER — IBUPROFEN 200 MG PO TABS: 600.0000 mg | ORAL_TABLET | Freq: Three times a day (TID) | ORAL | Status: DC | PRN

## 2012-10-29 MED ORDER — CHLORDIAZEPOXIDE HCL 25 MG PO CAPS: 25.0000 mg | ORAL_CAPSULE | Freq: Four times a day (QID) | ORAL | Status: DC | PRN

## 2012-10-29 MED ORDER — LOPERAMIDE HCL 2 MG PO CAPS
2.0000 mg | ORAL_CAPSULE | ORAL | Status: AC | PRN
  Administered 2012-10-29: 4 mg via ORAL
  Administered 2012-10-30: 2 mg via ORAL

## 2012-10-29 MED ORDER — CHLORDIAZEPOXIDE HCL 25 MG PO CAPS
25.0000 mg | ORAL_CAPSULE | Freq: Every day | ORAL | Status: AC
  Administered 2012-11-01: 25 mg via ORAL
  Filled 2012-10-29: qty 1

## 2012-10-29 MED ORDER — VITAMIN B-1 100 MG PO TABS: 100.0000 mg | ORAL_TABLET | Freq: Every day | ORAL | Status: DC

## 2012-10-29 MED ORDER — CHLORDIAZEPOXIDE HCL 25 MG PO CAPS
25.0000 mg | ORAL_CAPSULE | ORAL | Status: AC
  Administered 2012-10-31 (×2): 25 mg via ORAL
  Filled 2012-10-29 (×2): qty 1

## 2012-10-29 MED ORDER — CHLORDIAZEPOXIDE HCL 25 MG PO CAPS
25.0000 mg | ORAL_CAPSULE | Freq: Four times a day (QID) | ORAL | Status: AC
  Administered 2012-10-29 (×4): 25 mg via ORAL
  Filled 2012-10-29 (×4): qty 1

## 2012-10-29 MED ORDER — HYDROXYZINE HCL 25 MG PO TABS: 25.0000 mg | ORAL_TABLET | Freq: Four times a day (QID) | ORAL | Status: DC | PRN

## 2012-10-29 MED ORDER — ONDANSETRON 4 MG PO TBDP: 4.0000 mg | ORAL_TABLET | Freq: Four times a day (QID) | ORAL | Status: DC | PRN

## 2012-10-29 MED ORDER — ONDANSETRON 4 MG PO TBDP
4.0000 mg | ORAL_TABLET | Freq: Four times a day (QID) | ORAL | Status: AC | PRN
  Administered 2012-10-30: 4 mg via ORAL

## 2012-10-29 MED ORDER — TRAZODONE HCL 50 MG PO TABS: 50.0000 mg | ORAL_TABLET | Freq: Every evening | ORAL | Status: DC | PRN

## 2012-10-29 MED ORDER — THIAMINE HCL 100 MG/ML IJ SOLN
100.0000 mg | Freq: Once | INTRAMUSCULAR | Status: AC
  Administered 2012-10-29: 100 mg via INTRAMUSCULAR

## 2012-10-29 MED ORDER — ALBUTEROL SULFATE HFA 108 (90 BASE) MCG/ACT IN AERS
2.0000 | INHALATION_SPRAY | Freq: Four times a day (QID) | RESPIRATORY_TRACT | Status: DC | PRN
  Administered 2012-10-30 – 2012-10-31 (×2): 2 via RESPIRATORY_TRACT

## 2012-10-29 MED ORDER — ADULT MULTIVITAMIN W/MINERALS CH: 1.0000 | ORAL_TABLET | Freq: Every day | ORAL | Status: DC

## 2012-10-29 MED ORDER — ENSURE COMPLETE PO LIQD
237.0000 mL | Freq: Two times a day (BID) | ORAL | Status: DC
  Administered 2012-10-30 – 2012-10-31 (×4): 237 mL via ORAL
  Filled 2012-10-29 (×2): qty 237

## 2012-10-29 MED ORDER — CHLORDIAZEPOXIDE HCL 25 MG PO CAPS: 25.0000 mg | ORAL_CAPSULE | ORAL | Status: DC

## 2012-10-29 MED ORDER — CHLORDIAZEPOXIDE HCL 25 MG PO CAPS: 25.0000 mg | ORAL_CAPSULE | Freq: Four times a day (QID) | ORAL | Status: DC

## 2012-10-29 MED ORDER — LISINOPRIL 10 MG PO TABS
10.0000 mg | ORAL_TABLET | Freq: Every day | ORAL | Status: DC
  Administered 2012-10-30: 10 mg via ORAL
  Filled 2012-10-29 (×4): qty 1

## 2012-10-29 MED ORDER — LOPERAMIDE HCL 2 MG PO CAPS: 2.0000 mg | ORAL_CAPSULE | ORAL | Status: DC | PRN

## 2012-10-29 NOTE — [facility] Suicide Risk Assessment (Signed)
Suicide Risk Assessment  Admission Assessment     Nursing information obtained from:  Patient Demographic factors:  Male;Divorced or widowed;Low socioeconomic status;Unemployed Current Mental Status:  NA Loss Factors:  Loss of significant relationship;Legal issues;Financial problems / change in socioeconomic status Historical Factors:  Prior suicide attempts;Family history of mental illness or substance abuse Risk Reduction Factors:  Sense of responsibility to family;Living with another person, especially a relative;Positive social support  CLINICAL FACTORS:   Alcohol/Substance Abuse/Dependencies  COGNITIVE FEATURES THAT CONTRIBUTE TO RISK:  Closed-mindedness Polarized thinking Thought constriction (tunnel vision)    SUICIDE RISK:   Mild:  Suicidal ideation of limited frequency, intensity, duration, and specificity.  There are no identifiable plans, no associated intent, mild dysphoria and related symptoms, good self-control (both objective and subjective assessment), few other risk factors, and identifiable protective factors, including available and accessible social support.  PLAN OF CARE: Supportive approach/coping skills/relapse prevention                               [facility] Detox protocol   I certify that inpatient services furnished can reasonably be expected to improve the patient's condition.  Canisha Issac A 10/29/2012, 5:37 PM

## 2012-10-29 NOTE — Tx Team (Signed)
Initial Interdisciplinary Treatment Plan  PATIENT STRENGTHS: (choose at least two) General fund of knowledge Motivation for treatment/growth  PATIENT STRESSORS: Health problems Substance abuse   PROBLEM LIST: Problem List/Patient Goals Date to be addressed Date deferred Reason deferred Estimated date of resolution  ETOH Dependance      Depression      Risk for Suicide                                           DISCHARGE CRITERIA:  Ability to meet basic life and health needs Improved stabilization in mood, thinking, and/or behavior Motivation to continue treatment in a less acute level of care  PRELIMINARY DISCHARGE PLAN: Attend aftercare/continuing care group Attend PHP/IOP Outpatient therapy  PATIENT/FAMIILY INVOLVEMENT: This treatment plan has been presented to and reviewed with the patient, Joe Garcia.  The patient and family have been given the opportunity to ask questions and make suggestions.  Jacques Navy A 10/29/2012, 3:14 AM

## 2012-10-29 NOTE — Progress Notes (Signed)
[facility] LCSW Group Therapy  10/29/2012 1:15  Type of Therapy:  Group Therapy 1:15 to 2:30  Participation Level:  Did not attend   Clide Dales

## 2012-10-29 NOTE — Progress Notes (Addendum)
Patient ID: Joe Garcia, male   DOB: 1951/02/09, 62 y.o.   MRN: [id number]  Admission Note:  D:62 yr male who presents [facility] in no acute distress for the treatment of ETOH Detox and Depression. Pt appears flat and depressed. Pt was calm and cooperative with admission process.Pt denies SI/HI/ AVH .Pt has Past medical Hx of Asthma, Depression and  HTN. Pt uses an inhaler. Pt  Was D/C from [facility] in Feb, and went right back to drinking. Pt states his stressors are his wife's death of 35yrs and the fact that his daughter has cancer. Pt states he lives with 4 of his children. Pt has court date coming up on 4/15, which is another stressor. Pt states he has weakness in both hips, and Dr told pt he will need to have hip replacements in the future.   A:Skin was assessed and found to be clear of any abnormal marks, pt has very dry skin on his feet. POC and unit policies explained and understanding verbalized. Consents obtained. Food and fluids offered, and rejected.  D: Pt had no additional questions or concerns.
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
## 2012-10-29 NOTE — Tx Team (Signed)
Interdisciplinary Treatment Plan Update (Adult)  Date: 10/29/2012  Time Reviewed: 10:15 AM   Progress in Treatment: Attending groups: Yes Participating in groups: Yes Taking medication as prescribed:  Yes Tolerating medication:  Yes Family/Significant othe contact made: Not as yet Patient understands diagnosis: Yes Discussing patient identified problems/goals with staff: Yes Medical problems stabilized or resolved:  Yes Denies suicidal/homicidal ideation: Yes Patient has not harmed self or Others: Yes  New problem(s) identified: None Identified  Discharge Plan or Barriers:  CSW is assessing for appropriate referrals. Patient reports he has upcoming court date 4/15 he needs to attend  Additional comments: N/A  Reason for Continuation of Hospitalization: Anxiety Depression Medication stabilization Withdrawal symptoms   Estimated length of stay: 3-5 days  For review of initial/current patient goals, please see plan of care.  Attendees: Patient:     Family:     Physician:  Geoffery Lyons 10/29/2012 10:15 AM   Nursing:   Nestor Ramp, RN 10/29/2012 10:15 AM   Clinical Social Worker Ronda Fairly 10/29/2012 10:15 AM   Other:     Other:     Other:     Other:      Scribe for Treatment Team:   Carney Bern, LCSWA  10/29/2012 10:15 AM

## 2012-10-29 NOTE — [facility] Group Notes (Signed)
[facility] LCSW Aftercare Discharge Planning Group Note   10/29/2012 8:45 AM  Participation Quality:  Appropriate  Mood/Affect:  Flat  Depression Rating:  7  Anxiety Rating:  3  Thoughts of Suicide:  No Will you contract for safety?   NA  Current AVH:  No  Plan for Discharge/Comments:  Patient has court date on 4/15 he needs to attend.  Patient follows up at [facility] with Mr Shirline Frees, CSW will confirm with [facility].  Patient reports he became upset due to Mr Alveda Reasons (also at [facility]) refusal to isse certificate of Anger Management Course completion.   Transportation Means:  Bus  Supports: Limited  Dyane Dustman, Julious Payer

## 2012-10-29 NOTE — Progress Notes (Signed)
Adult Psychoeducational Group Note  Date:  10/29/2012 Time:  10:00a  Group Topic/Focus:  Early Warning Signs:   The focus of this group is to help patients identify signs or symptoms they exhibit before slipping into an unhealthy state or crisis.  Participation Level:  Did Not Attend  Participation Quality:    Affect: Cognitive: Insight:  Engagement in Group:    Modes of Intervention:    Additional Comments: Patient did not want to attend group, he just wanted to get some rest.  Konrad Hoak H 10/29/2012, 11:57 AM

## 2012-10-29 NOTE — Progress Notes (Signed)
Nutrition Brief Note  Patient identified on the Malnutrition Screening Tool (MST) Report  Body mass index is 21.85 kg/(m^2). Patient meets criteria for WNL based on current BMI.   Current diet order is regular, patient is consuming approximately very good% of meals at this time. Labs and medications reviewed.   Patient stated that he is eating very well here but did not drink prior to admit due to ETOH use.  UBW 155#, current wt:  148 lbs, Ht:  5'9.  Patient does not know how long he hadn't been eating.  Receptive to Ensure to try to improve nutrition.    Will order Ensure Complete po BID, each supplement provides 350 kcal and 13 grams of protein.  Discussed importance of proper nutrition.  Patient receiving MVI and thiamine.    No further nutrition interventions warranted at this time. If further nutrition issues arise, please consult RD.   Oran Rein, RD, LDN Clinical Inpatient Dietitian Pager:  [phone] Weekend and after hours pager:  [phone]

## 2012-10-29 NOTE — Progress Notes (Signed)
D: Patient denies SI/HI and auditory and visual hallucinations. The patient has a depressed mood and affect. The patient reports sleeping fairly well and states that his appetite is improving but that his energy level is low. The patient reports minimal detox symptoms and states that he "feels fine." Patient is isolating to his room and prefers to sleep through the day. The patient was given education regarding his medication and his hypertension.  A: Patient given emotional support from RN. Patient encouraged to come to staff with concerns and/or questions. Patient's medication routine continued. Patient's orders and plan of care reviewed.  R: Patient remains cooperative. Patient reports no concerns or needs at this time. Will continue to monitor patient q15 minutes for safety.

## 2012-10-30 DIAGNOSIS — R45851 Suicidal ideations: Secondary | ICD-10-CM

## 2012-10-30 DIAGNOSIS — F1010 Alcohol abuse, uncomplicated: Secondary | ICD-10-CM

## 2012-10-30 DIAGNOSIS — F1020 Alcohol dependence, uncomplicated: Secondary | ICD-10-CM

## 2012-10-30 MED ORDER — DIPHENHYDRAMINE HCL 25 MG PO CAPS
25.0000 mg | ORAL_CAPSULE | Freq: Four times a day (QID) | ORAL | Status: DC | PRN
  Administered 2012-10-31 (×4): 25 mg via ORAL

## 2012-10-30 NOTE — Progress Notes (Signed)
Patient ID: Joe Garcia, male   DOB: [birth year], 62 y.o.   MRN: [id number] D: Patient presented with depressed mood and flat affect. Pt denies any other s/s of withdrawals. Pt denies SI/HI/AVH. Pt attended evening AA group and Interacted appropriately with peers. Cooperative with assessment. No acute distressed noted at this time.   A: Met with pt 1:1. Medications administered as prescribed. Writer encouraged pt to discuss feelings. Pt encouraged to come to staff with any question or concerns. 15 minutes checks for safety.  R: Patient remains safe. He is complaint with medications and group programming. Continue current POC.

## 2012-10-30 NOTE — Progress Notes (Signed)
[facility] MD Progress Note  10/30/2012 3:23 PM Joe Garcia  MRN:  [id number]  Subjective: "I don't fill good. My stomach is cramping. I'm still shaking. My mood is bad"  Diagnosis:   Axis I: Alcohol Abuse and Alcohol dependence, Suicidal ideation Axis II: Deferred Axis III:  Past Medical History  Diagnosis Date  . Asthma   . Hypertension   . Alcoholism   . Family history of anesthesia complication   . Depression    Axis IV: other psychosocial or environmental problems and Alcoholism Axis V: 41-50 serious symptoms  ADL's:  Impaired  Sleep: Fair  Appetite:  Good  Suicidal Ideation:  Denies Homicidal Ideation:  Denies AEB (as evidenced by):  Psychiatric Specialty Exam: Review of Systems  Constitutional: Negative.   HENT: Negative.   Eyes: Negative.   Respiratory: Negative.   Cardiovascular: Negative.   Gastrointestinal: Positive for nausea, abdominal pain and diarrhea.  Genitourinary: Negative.   Musculoskeletal: Positive for myalgias.  Skin: Negative.   Neurological: Positive for tremors.  Endo/Heme/Allergies: Negative.   Psychiatric/Behavioral: Positive for substance abuse. Negative for depression, suicidal ideas, hallucinations and memory loss. The patient has insomnia. The patient is not nervous/anxious.     Blood pressure 162/77, pulse 76, temperature 98.1 F (36.7 C), temperature source Oral, resp. rate 20, height 5\' 9"  (1.753 m), weight 67.132 kg (148 lb).Body mass index is 21.85 kg/(m^2).  General Appearance: Disheveled  Eye Solicitor::  Fair  Speech:  Clear and Coherent  Volume:  Normal  Mood:  Anxious and Depressed  Affect:  Flat  Thought Process:  Coherent  Orientation:  Full (Time, Place, and Person)  Thought Content:  Rumination  Suicidal Thoughts:  No  Homicidal Thoughts:  No  Memory:  Immediate;   Good Recent;   Fair Remote;   Fair  Judgement:  Poor  Insight:  Lacking  Psychomotor Activity:  Restlessness and Tremor  Concentration:  Fair   Recall:  Fair  Akathisia:  No  Handed:  Right  AIMS (if indicated):     Assets:  Desire for Improvement  Sleep:  Number of Hours: 6.25   Current Medications: Current Facility-Administered Medications  Medication Dose Route Frequency Provider Last Rate Last Dose  . acetaminophen (TYLENOL) tablet 650 mg  650 mg Oral Q6H PRN Jorje Guild, PA-C      . albuterol (PROVENTIL HFA;VENTOLIN HFA) 108 (90 BASE) MCG/ACT inhaler 2 puff  2 puff Inhalation Q6H PRN Jorje Guild, PA-C      . alum & mag hydroxide-simeth (MAALOX/MYLANTA) 200-200-20 MG/5ML suspension 30 mL  30 mL Oral Q4H PRN Jorje Guild, PA-C      . chlordiazePOXIDE (LIBRIUM) capsule 25 mg  25 mg Oral Q6H PRN Jorje Guild, PA-C      . chlordiazePOXIDE (LIBRIUM) capsule 25 mg  25 mg Oral TID Jorje Guild, PA-C   25 mg at 10/30/12 1231   Followed by  . [START ON 10/31/2012] chlordiazePOXIDE (LIBRIUM) capsule 25 mg  25 mg Oral BH-qamhs Jorje Guild, PA-C       Followed by  . [START ON 11/01/2012] chlordiazePOXIDE (LIBRIUM) capsule 25 mg  25 mg Oral Daily Jorje Guild, PA-C      . feeding supplement (ENSURE COMPLETE) liquid 237 mL  237 mL Oral BID BM Jeoffrey Massed, RD   237 mL at 10/30/12 1000  . hydrOXYzine (ATARAX/VISTARIL) tablet 25 mg  25 mg Oral Q6H PRN Jorje Guild, PA-C      . ibuprofen (ADVIL,MOTRIN) tablet 600 mg  600 mg  Oral QID PRN Jorje Guild, PA-C      . lisinopril (PRINIVIL,ZESTRIL) tablet 10 mg  10 mg Oral Daily Sanjuana Kava, NP   10 mg at 10/30/12 1200  . loperamide (IMODIUM) capsule 2-4 mg  2-4 mg Oral PRN Jorje Guild, PA-C   2 mg at 10/30/12 1337  . magnesium hydroxide (MILK OF MAGNESIA) suspension 30 mL  30 mL Oral Daily PRN Jorje Guild, PA-C      . multivitamin with minerals tablet 1 tablet  1 tablet Oral Daily Jorje Guild, PA-C   1 tablet at 10/30/12 [id number]  . ondansetron (ZOFRAN-ODT) disintegrating tablet 4 mg  4 mg Oral Q6H PRN Jorje Guild, PA-C      . thiamine (VITAMIN B-1) tablet 100 mg  100 mg Oral Daily Jorje Guild, PA-C   100 mg at 10/30/12 9604  .  traZODone (DESYREL) tablet 50 mg  50 mg Oral QHS,MR X 1 Nanine Means, NP   50 mg at 10/29/12 2243    Lab Results: No results found for this or any previous visit (from the past 48 hour(s)).  Physical Findings: AIMS: Facial and Oral Movements Muscles of Facial Expression: None, normal Lips and Perioral Area: None, normal Jaw: None, normal Tongue: None, normal,Extremity Movements Upper (arms, wrists, hands, fingers): None, normal Lower (legs, knees, ankles, toes): None, normal, Trunk Movements Neck, shoulders, hips: None, normal, Overall Severity Severity of abnormal movements (highest score from questions above): None, normal Incapacitation due to abnormal movements: None, normal Patient's awareness of abnormal movements (rate only patient's report): No Awareness, Dental Status Current problems with teeth and/or dentures?: No Does patient usually wear dentures?: No  CIWA:  CIWA-Ar Total: 0 COWS:  COWS Total Score: 4  Treatment Plan Summary: Daily contact with patient to assess and evaluate symptoms and progress in treatment Medication management  Plan: Supportive approach/coping skills/relapse prevention. Encouraged out of room, participation in group sessions and application of coping skills when distressed. Will continue to monitor response to/adverse effects of medications in use to assure effectiveness. Continue to monitor mood, behavior and interaction with staff and other patients. Continue current plan of care.  Medical Decision Making Problem Points:  Established problem, stable/improving (1), Review of last therapy session (1) and Review of psycho-social stressors (1) Data Points:  Review of medication regiment & side effects (2) Review of new medications or change in dosage (2)  I certify that inpatient services furnished can reasonably be expected to improve the patient's condition.   [name] I 10/30/2012, 3:23 PM

## 2012-10-30 NOTE — Progress Notes (Addendum)
Psychoeducational Group Note  Date:  10/30/2012 Time: 1015  Group Topic/Focus:  Identifying  Goals : The focus of this group is to help the patient identify what gaol they want to achieve today. Pt sates he wants to " behave  " today.  Participation Level:  active Participation Quality: good Affect: flat Cognitive:  good   Insight:  good  Engagement in Group: engaged  Additional Comments:    PD RN [facility]

## 2012-10-30 NOTE — Progress Notes (Signed)
D) Pt has attended the groups and interacts with select peers. Denies SI and HI. Rates his depression and his hopelessness both at a 10. Pt C/O diarrhea today and stated taht as fast as it goes in me, that's as fast as it comes out of me. Affect is flat and mood depressed. Was able to ask for help with his packet today. States the place he wants to go to when he leaves here wants him to invest in everything that is offered to him. Pt states he wants to bring the work that he does here in groups to the long term placement so they will see that he is invested A) Givens support and reassurance along with praise. Encouraged to go to all his groups and to participate in them. Encouraged to do his packet. R) Denies SI and HI.

## 2012-10-30 NOTE — [facility] Group Notes (Signed)
[facility] LCSW Group Therapy  10/30/2012 10am  Type of Therapy:  Group Therapy  Participation Level:  Active  Participation Quality:  Appropriate, Attentive, Sharing and Supportive  Affect:  Blunted  Cognitive:  Appropriate  Insight:  Engaged  Engagement in Therapy:  Engaged  Modes of Intervention:  Exploration  Summary of Progress/Problems:  The main focus of today's process group was for the patient to identify ways in which they have in the past sabotaged their own recovery. Motivational Interviewing was utilized to ask the group members what they get out of their substance use, and how their behavior interferes with who they want to be.  The Stages of Change were explained, and members rated their motivation to change on a scale of 0 (no desire) to 10 (completely committed).  The patient expressed that he was just fired from his job, lost his "old lady" and was told his child has cancer.  He stated that he is not a pleasant drunk, but that he becomes violent.  His motivation to change is 5, and he admits that he has not yet made up his mind whether he wants to quit.  Sarina Ser 10/30/2012, 12:35 PM

## 2012-10-31 NOTE — Progress Notes (Signed)
Thankfulness  Group Note  Date:  10/31/2012 Time:  0900 Group Topic/Focus:  Being thankful :   The focus of this group is to help patients identify  Things, people and events in their lives for which to ground them and that they are thankful for.  Participation Level:  Active  Participation Quality:  Appropriate  Affect:  Appropriate  Cognitive:  Alert  Insight:  Engaged  Engagement in Group:  Engaged  Additional Comments:    Joe Garcia 11:41 AM. 10/31/2012

## 2012-10-31 NOTE — [facility] Group Notes (Signed)
[facility] LCSW Group Therapy  10/31/2012  10AM  Did not attend  Sarina Ser 10/31/2012, 11:17 AM

## 2012-10-31 NOTE — ED Notes (Signed)
Patient noticed facial swelling in early afternoon yesterday. States that he was sick with stomach pains and diarrhea 8-9X. This occurred prior to his swelling. Took benadryl last night and last night. Swelling improving per patient.

## 2012-10-31 NOTE — Progress Notes (Signed)
Patient ID: Joe Garcia, male   DOB: [birth year], 62 y.o.   MRN: [id number] D)  Was sitting in the dayroom this evening, was quiet, seemed to have minimal interaction with peers and staff.  When asked how he was doing this evening, he looked at me and asked if his face appeared swollen , and it did, especially his eyes.  Stated he had taken something earlier today and had felt funny, then felt SOB, and went to the med window and asked for his inhaler, which had helped.  Was given vistaril and NP notified of incident which he hadn't reported to previous RN, and was also given order for benadryl after midnight, since he had received vistaril.  Attended group and got snack, was tired, went to bed. A)  Will continue to monitor for safety,  Continue POC R) appreciative, safety maintained.

## 2012-10-31 NOTE — ED Notes (Signed)
Bed:WA04<BR> Expected date:<BR> Expected time:<BR> Means of arrival:<BR> Comments:<BR>

## 2012-10-31 NOTE — ED Notes (Signed)
No treatment needed per MD Jacubowitz. Sending patient back to [facility]

## 2012-10-31 NOTE — Progress Notes (Addendum)
Pt stated this am he did not feel good. Positive facial swelling with swollen tongue. Denies difficulty swallowing and stated at times he does feel shortness of breath . Pt pulse ox this am was 100% on room air with scattered wheezes throughout. Pt was sent with a tech to the ED for facial swelling -was also given 25 mg of benedryl this am and lisnopril was stopped. Pt did take two puffs of his inhaler and stated he felt much better. Will continue to monitor closely and will continue to give benedryl prn every 6 hours for facial swelling. Pt was sent back from the ER. According to the pt." nothing was done and they sent me back."They did give me new paper scrubs."Pt has been in groups and is very pleasant and cooperative. NO SI or HI and remains safe on q 15 minute checks. He stated his depression is a 5/10 and hopelessness is a 7/10.pt is very pleasant and cooperative but does not converse much.

## 2012-10-31 NOTE — ED Notes (Signed)
Attempted to call [facility] nurse. Kept on hold. Security called to transport back

## 2012-10-31 NOTE — Progress Notes (Signed)
[facility] Group Notes:  (Nursing/MHT/Case Management/Adjunct)  Date:  10/30/2012   Time:  2100   Type of Therapy:  wrap up group  Participation Level:  Active  Participation Quality:  Appropriate, Attentive and Sharing  Affect:  Flat  Cognitive:  Alert and Appropriate  Insight:  Appropriate  Engagement in Group:  Engaged  Modes of Intervention:  Clarification, Education and Support  Summary of Progress/Problems: Pt stated that he feels that he cannot change his environment, "wherever I go there will be a store that sells alcohol".  Pt reports wanted to attend AA meetings and get a sponsor. Shelah Lewandowsky 10/31/2012, 2:33 AM

## 2012-10-31 NOTE — ED Provider Notes (Signed)
History     CSN: [id number]  Arrival date & time 10/31/12  0825   First MD Initiated Contact with Patient 10/31/12 [phone]      No chief complaint on file.   (Consider location/radiation/quality/duration/timing/severity/associated sxs/prior treatment) HPI Patient reports facial swelling onset yesterday. Has improved since today since treating with Benadryl he denies difficulty swallowing denies difficulty breathing denies voice change denies swelling of his throat or tongue. No other complaint. Patient currently is in inpatient [facility] for alcohol abuse [facility]. Symptoms improving steadily with time no other associated symptoms Past Medical History  Diagnosis Date  . Asthma   . Hypertension   . Alcoholism   . Family history of anesthesia complication   . Depression     History reviewed. No pertinent past surgical history.  History reviewed. No pertinent family history.  History  Substance Use Topics  . Smoking status: Former Games developer  . Smokeless tobacco: Not on file  . Alcohol Use: Yes     Comment: heavy drinking of etoh for last week per pt      Review of Systems  Constitutional: Negative.   HENT: Positive for facial swelling.   Respiratory: Negative.   Cardiovascular: Negative.   Gastrointestinal: Negative.   Musculoskeletal: Negative.   Skin: Negative.   Neurological: Negative.   Psychiatric/Behavioral: Negative.   All other systems reviewed and are negative.    Allergies  Penicillins  Home Medications   Current Outpatient Rx  Name  Route  Sig  Dispense  Refill  . albuterol (PROVENTIL HFA;VENTOLIN HFA) 108 (90 BASE) MCG/ACT inhaler   Inhalation   Inhale 2 puffs into the lungs every 6 (six) hours as needed for wheezing.         Marland Kitchen ibuprofen (ADVIL) 200 MG tablet   Oral   Take 4 tablets (800 mg total) by mouth every 8 (eight) hours as needed for pain.   30 tablet   0     BP 111/56  Pulse 67  Temp(Src) 97.7 F (36.5 C)  (Oral)  Resp 16  Ht 5\' 9"  (1.753 m)  Wt 148 lb (67.132 kg)  BMI 21.85 kg/m2  SpO2 100%  Physical Exam  Nursing note and vitals reviewed. Constitutional: He appears well-developed and well-nourished.  HENT:  Head: Normocephalic and atraumatic.  Right Ear: External ear normal.  Left Ear: External ear normal.  No swelling of tongue lips or uvula no redness of face no tenderness no marked facial swelling noted  Eyes: Conjunctivae are normal. Pupils are equal, round, and reactive to light.  Neck: Neck supple. No tracheal deviation present. No thyromegaly present.  Cardiovascular: Normal rate and regular rhythm.   No murmur heard. Pulmonary/Chest: Effort normal and breath sounds normal.  Abdominal: Soft. Bowel sounds are normal. He exhibits no distension. There is no tenderness.  Musculoskeletal: Normal range of motion. He exhibits no edema and no tenderness.  Neurological: He is alert. Coordination normal.  Skin: Skin is warm and dry. No rash noted.  Psychiatric: He has a normal mood and affect.    ED Course  Procedures (including critical care time)  Labs Reviewed - No data to display No results found.   1. Alcohol dependence   2. Alcohol abuse   3. Suicide ideation       MDM  Suspect angioedema as result of ACE inhibitor, lisinopril  spoke with Ms.Nwoko , NP plan stop lisinopril. Patient should be told that he is allergic toACe inhinbitor If she requires further blood  pressure management consult hospitalist service Diagnosis angioedema        Doug Sou, MD 10/31/12 [phone]

## 2012-10-31 NOTE — Progress Notes (Signed)
Patient ID: Joe Garcia, male   DOB: 1950-11-17, 62 y.o.   MRN: [id number] [facility] MD Progress Note  10/31/2012 2:48 PM Joe Garcia  MRN:  [id number]  Subjective: "I feel fine. I have no shakes and no stomach cramp.  My mood is good too. I feel I'm ready to go home. I will try to go to AA meetings and do what I'm suppose to do. This time, I'm sticking to it". Joe Garcia was transferred to the ED this am due to facial swellings. See ED notes. Will discontinue Lisinopril.  Diagnosis:   Axis I: Alcohol Abuse and Alcohol dependence, Suicidal ideation Axis II: Deferred Axis III:  Past Medical History  Diagnosis Date  . Asthma   . Hypertension   . Alcoholism   . Family history of anesthesia complication   . Depression    Axis IV: other psychosocial or environmental problems and Alcoholism Axis V: 41-50 serious symptoms  ADL's:  Impaired  Sleep: Fair  Appetite:  Good  Suicidal Ideation:  Denies Homicidal Ideation:  Denies AEB (as evidenced by):  Psychiatric Specialty Exam: Review of Systems  Constitutional: Negative.   HENT: Negative.   Eyes: Negative.   Respiratory: Negative.   Cardiovascular: Negative.   Gastrointestinal: Positive for nausea, abdominal pain and diarrhea.  Genitourinary: Negative.   Musculoskeletal: Positive for myalgias.  Skin: Negative.   Neurological: Positive for tremors.  Endo/Heme/Allergies: Negative.   Psychiatric/Behavioral: Positive for substance abuse. Negative for depression, suicidal ideas, hallucinations and memory loss. The patient has insomnia. The patient is not nervous/anxious.     Blood pressure 136/79, pulse 73, temperature 97.7 F (36.5 C), temperature source Oral, resp. rate 16, height 5\' 9"  (1.753 m), weight 67.132 kg (148 lb), SpO2 100.00%.Body mass index is 21.85 kg/(m^2).  General Appearance: Disheveled  Eye Solicitor::  Fair  Speech:  Clear and Coherent  Volume:  Normal  Mood:  Anxious and Depressed  Affect:  Flat   Thought Process:  Coherent  Orientation:  Full (Time, Place, and Person)  Thought Content:  Rumination  Suicidal Thoughts:  No  Homicidal Thoughts:  No  Memory:  Immediate;   Good Recent;   Fair Remote;   Fair  Judgement:  Poor  Insight:  Lacking  Psychomotor Activity:  Restlessness and Tremor  Concentration:  Fair  Recall:  Fair  Akathisia:  No  Handed:  Right  AIMS (if indicated):     Assets:  Desire for Improvement  Sleep:  Number of Hours: 3.75   Current Medications: Current Facility-Administered Medications  Medication Dose Route Frequency Provider Last Rate Last Dose  . acetaminophen (TYLENOL) tablet 650 mg  650 mg Oral Q6H PRN Jorje Guild, PA-C      . albuterol (PROVENTIL HFA;VENTOLIN HFA) 108 (90 BASE) MCG/ACT inhaler 2 puff  2 puff Inhalation Q6H PRN Jorje Guild, PA-C   2 puff at 10/31/12 0748  . alum & mag hydroxide-simeth (MAALOX/MYLANTA) 200-200-20 MG/5ML suspension 30 mL  30 mL Oral Q4H PRN Jorje Guild, PA-C      . chlordiazePOXIDE (LIBRIUM) capsule 25 mg  25 mg Oral Q6H PRN Jorje Guild, PA-C      . chlordiazePOXIDE (LIBRIUM) capsule 25 mg  25 mg Oral BH-qamhs Jorje Guild, PA-C   25 mg at 10/31/12 0748   Followed by  . [START ON 11/01/2012] chlordiazePOXIDE (LIBRIUM) capsule 25 mg  25 mg Oral Daily Jorje Guild, PA-C      . diphenhydrAMINE (BENADRYL) capsule 25 mg  25 mg Oral Q6H  PRN Sanjuana Kava, NP   25 mg at 10/31/12 1417  . feeding supplement (ENSURE COMPLETE) liquid 237 mL  237 mL Oral BID BM Rachael Fee, MD   237 mL at 10/31/12 1345  . hydrOXYzine (ATARAX/VISTARIL) tablet 25 mg  25 mg Oral Q6H PRN Jorje Guild, PA-C   25 mg at 10/30/12 2004  . ibuprofen (ADVIL,MOTRIN) tablet 600 mg  600 mg Oral QID PRN Jorje Guild, PA-C      . loperamide (IMODIUM) capsule 2-4 mg  2-4 mg Oral PRN Jorje Guild, PA-C   2 mg at 10/30/12 1337  . magnesium hydroxide (MILK OF MAGNESIA) suspension 30 mL  30 mL Oral Daily PRN Jorje Guild, PA-C      . multivitamin with minerals tablet 1 tablet  1 tablet Oral  Daily Jorje Guild, PA-C   1 tablet at 10/31/12 0750  . ondansetron (ZOFRAN-ODT) disintegrating tablet 4 mg  4 mg Oral Q6H PRN Jorje Guild, PA-C   4 mg at 10/30/12 2008  . thiamine (VITAMIN B-1) tablet 100 mg  100 mg Oral Daily Jorje Guild, PA-C   100 mg at 10/31/12 0750  . traZODone (DESYREL) tablet 50 mg  50 mg Oral QHS,MR X 1 Nanine Means, NP   50 mg at 10/29/12 2243    Lab Results: No results found for this or any previous visit (from the past 48 hour(s)).  Physical Findings: AIMS: Facial and Oral Movements Muscles of Facial Expression: None, normal Lips and Perioral Area: None, normal Jaw: None, normal Tongue: None, normal,Extremity Movements Upper (arms, wrists, hands, fingers): None, normal Lower (legs, knees, ankles, toes): None, normal, Trunk Movements Neck, shoulders, hips: None, normal, Overall Severity Severity of abnormal movements (highest score from questions above): None, normal Incapacitation due to abnormal movements: None, normal Patient's awareness of abnormal movements (rate only patient's report): No Awareness, Dental Status Current problems with teeth and/or dentures?: No Does patient usually wear dentures?: No  CIWA:  CIWA-Ar Total: 0 COWS:  COWS Total Score: 4  Treatment Plan Summary: Daily contact with patient to assess and evaluate symptoms and progress in treatment Medication management  Plan: Supportive approach/coping skills/relapse prevention. Reviewed ED notes. Discontinue Lisinopril. Encouraged out of room, participation in group sessions and application of coping skills when distressed. Will continue to monitor response to/adverse effects of medications in use to assure effectiveness. Continue to monitor mood, behavior and interaction with staff and other patients. Continue current plan of care.  Medical Decision Making Problem Points:  Established problem, stable/improving (1), Review of last therapy session (1) and Review of psycho-social stressors  (1) Data Points:  Review of medication regiment & side effects (2) Review of new medications or change in dosage (2)  I certify that inpatient services furnished can reasonably be expected to improve the patient's condition.   [name] I 10/31/2012, 2:48 PM

## 2012-11-01 DIAGNOSIS — F1910 Other psychoactive substance abuse, uncomplicated: Secondary | ICD-10-CM

## 2012-11-01 MED ORDER — HYDROXYZINE HCL 25 MG PO TABS: 25.0000 mg | ORAL_TABLET | Freq: Four times a day (QID) | ORAL | Status: DC | PRN

## 2012-11-01 MED ORDER — DIPHENHYDRAMINE HCL 25 MG PO CAPS: 25.0000 mg | ORAL_CAPSULE | Freq: Four times a day (QID) | ORAL | Status: DC | PRN

## 2012-11-01 MED ORDER — ALBUTEROL SULFATE HFA 108 (90 BASE) MCG/ACT IN AERS: 2.0000 | INHALATION_SPRAY | Freq: Four times a day (QID) | RESPIRATORY_TRACT | Status: DC | PRN

## 2012-11-01 MED ORDER — TRAZODONE HCL 50 MG PO TABS: 50.0000 mg | ORAL_TABLET | Freq: Every evening | ORAL | Status: DC | PRN

## 2012-11-01 NOTE — Progress Notes (Signed)
Adult Psychoeducational Group Note  Date:  11/01/2012 Time:  10:57 AM  Group Topic/Focus:  Coping With Mental Health Crisis:   The purpose of this group is to help patients identify strategies for coping with mental health crisis.  Group discusses possible causes of crisis and ways to manage them effectively.  Participation Level:  Active  Participation Quality:  Attentive  Affect:  Appropriate  Cognitive:  Alert  Insight: Good  Engagement in Group:  Engaged  Modes of Intervention:  Activity  Additional Comments:  Pt has a goal to not isolate   Sadee Osland T 11/01/2012, 10:57 AM

## 2012-11-01 NOTE — Progress Notes (Signed)
Patient did attend the evening speaker AA meeting.  

## 2012-11-01 NOTE — Progress Notes (Signed)
Patient ID: Joe Garcia, male   DOB: [birth year], 62 y.o.   MRN: [id number] D)  Has been quiet, but out in the dayroom watching TV , little conversation, but states is feeling better this evening, face is less swollen, and stated he has used his inhaler more often today , so he wouldn't have the feeling of SOB.  Has been pleasant and cooperative, denies w/d sx.  Denies thoughts of self harm. A)  Will continue to monitor for safety, continue POC R)  Safety maintained.

## 2012-11-01 NOTE — Progress Notes (Signed)
Adult Psychoeducational Group Note  Date:  11/01/2012 Time:  1:17 PM  Group Topic/Focus:  Self Care:   The focus of this group is to help patients understand the importance of self-care in order to improve or restore emotional, physical, spiritual, interpersonal, and financial health.  Participation Level:  Active  Participation Quality:  Appropriate, Attentive and Sharing  Affect:  Appropriate  Cognitive:  Appropriate  Insight: Appropriate  Engagement in Group:  Engaged  Modes of Intervention:  Discussion  Additional Comments: Pt was appropriate and attentive while attending group. Pt shared that he likes to spend time with his dog and he needs to work on enlarging his social circle.   Sharyn Lull 11/01/2012, 1:17 PM

## 2012-11-01 NOTE — Tx Team (Signed)
Interdisciplinary Treatment Plan Update (Adult)  Date: 11/01/2012  Time Reviewed: 10:16 AM   Progress in Treatment: Attending groups: Yes Participating in groups: No Taking medication as prescribed:  Yes Tolerating medication:  Yes Family/Significant othe contact made: No, patient refuses Patient understands diagnosis: Yes Discussing patient identified problems/goals with staff: Yes Medical problems stabilized or resolved:  Yes Denies suicidal/homicidal ideation: Yes Patient has not harmed self or Others: Yes  New problem(s) identified: None Identified  Discharge Plan or Barriers:  CSW will follow up at [facility] Additional comments: N/A  Reason for Continuation of Hospitalization:NA  Estimated length of stay: Discharge today  For review of initial/current patient goals, please see plan of care.  Attendees: Patient:     Family:     Physician:  Geoffery Lyons 11/01/2012 10:16 AM   Nursing:   [place] Miners, RN 11/01/2012  10:16 AM   Clinical Social Worker Ronda Fairly 11/01/2012 10:16 AM   Other:  Berneice Heinrich, RN 11/01/2012 10:16 AM   Other:  Georgina Quint, [place] PA Student 11/01/2012 10:166 AM   Other:     Other:      Scribe for Treatment Team:   Carney Bern, LCSWA  11/01/2012 10:16 AM

## 2012-11-01 NOTE — Progress Notes (Signed)
Patient denies SI/HI, denies A/V hallucinations. Patient verbalizes understanding of discharge instructions, follow up care and prescriptions. Patient given all belongings from [facility] locker. Patient escorted out by staff, transported by family. 
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
## 2012-11-01 NOTE — [facility] Group Notes (Signed)
[facility] LCSW Group Therapy  11/01/2012 4:48 PM  Type of Therapy:  Group Therapy  Participation Level:  Active  Participation Quality:  Appropriate, Sharing and Supportive  Affect:  Appropriate  Cognitive:  Alert and Oriented  Insight:  Developing/Improving  Engagement in Therapy:  Engaged  Modes of Intervention:  Clarification, Confrontation, Discussion, Education, Exploration, Limit-setting, Orientation, Problem-solving, Rapport Building, Dance movement psychotherapist, Socialization and Support  Summary of Progress/Problems: The topic for group today was overcoming obstacles.  Pt discussed overcoming obstacles and what this means for pt. Pt stated that his obstacle going forward would be himself. Pt discussed how his past history of alcoholism and substance abuse ultimately developed from not being equipped to handle his 60 year old daughter being diagnosed with cancer. Pt stated that he is anxious about leaving although "I will keep the faith." Pt requested that his peers keep him in their prayers and that he will strive to make everyday of his life count going forward.   Haskel Khan 11/01/2012, 4:48 PM

## 2012-11-02 NOTE — [facility] Suicide Risk Assessment (Signed)
[facility] INPATIENT:  Family/Significant Other Suicide Prevention Education  Suicide Prevention Education:  Patient reused consent to contact supports to provide suicide prevention education. Writer provided suicide prevention education directly to patient; conversation included risk factors, warning signs and resources to contact for help. Mobile crisis services explained and contact card placed in chart for pt to receive at discharge. Carney Bern, LCSWA   11/02/2012

## 2012-11-02 NOTE — Progress Notes (Signed)
[name] Adult Case Management Discharge Plan :  Will you be returning to the same living situation after discharge:Yes At discharge, do you have transportation home?Yes Do you have the ability to pay for your medications:Yes  Release of information consent forms completed and in the chart;  Patient's signature needed at discharge.  Patient to Follow up at: Follow-up Information   Follow up with [facility] . (Go to [facility] on Wed 4/9 between 8 AM and 12 noon  for assessment)    Contact information:   [address] [place]  [address] Ph [phone] Valinda Hoar [phone]      Patient denies SI/HI:   Denies Both  Safety Planning and Suicide Prevention discussed: With Patient  Joe Garcia 11/01/12 5;00 PM

## 2012-11-02 NOTE — [facility] Group Notes (Signed)
[facility] LCSW Aftercare Discharge Planning Group Note   11/02/2012 8:45 AM  Participation Quality:  Did Not Attend   Joe Garcia
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
## 2012-11-05 NOTE — Progress Notes (Signed)
Patient Discharge Instructions:  After Visit Summary (AVS):   Faxed to:  11/05/12 Discharge Summary Note:   Faxed to:  11/05/12 Psychiatric Admission Assessment Note:   Faxed to:  11/05/12 Suicide Risk Assessment - Discharge Assessment:   Faxed to:  11/05/12 Faxed/Sent to the Next Level Care provider:  11/05/12 Faxed to [facility] @ [phone]  Jerelene Redden, 11/05/2012, 3:30 PM

## 2013-03-24 ENCOUNTER — Emergency Department (HOSPITAL_COMMUNITY)
Admission: EM | Admit: 2013-03-24 | Discharge: 2013-03-24 | Disposition: A | Discharge status: Home / Self Care | Payer: Medicaid Other | Attending: Emergency Medicine | Admitting: Emergency Medicine

## 2013-03-24 ENCOUNTER — Emergency Department (HOSPITAL_COMMUNITY): Payer: Medicaid Other

## 2013-03-24 ENCOUNTER — Encounter (HOSPITAL_COMMUNITY): Payer: Self-pay | Admitting: *Deleted

## 2013-03-24 DIAGNOSIS — F1021 Alcohol dependence, in remission: Secondary | ICD-10-CM | POA: Insufficient documentation

## 2013-03-24 DIAGNOSIS — Z8659 Personal history of other mental and behavioral disorders: Secondary | ICD-10-CM | POA: Insufficient documentation

## 2013-03-24 DIAGNOSIS — R05 Cough: Secondary | ICD-10-CM | POA: Insufficient documentation

## 2013-03-24 DIAGNOSIS — Z79899 Other long term (current) drug therapy: Secondary | ICD-10-CM | POA: Insufficient documentation

## 2013-03-24 DIAGNOSIS — I10 Essential (primary) hypertension: Secondary | ICD-10-CM | POA: Insufficient documentation

## 2013-03-24 DIAGNOSIS — Z880 Allergy status to penicillin: Secondary | ICD-10-CM | POA: Insufficient documentation

## 2013-03-24 DIAGNOSIS — J3489 Other specified disorders of nose and nasal sinuses: Secondary | ICD-10-CM | POA: Insufficient documentation

## 2013-03-24 DIAGNOSIS — M129 Arthropathy, unspecified: Secondary | ICD-10-CM | POA: Insufficient documentation

## 2013-03-24 DIAGNOSIS — M1990 Unspecified osteoarthritis, unspecified site: Secondary | ICD-10-CM

## 2013-03-24 DIAGNOSIS — Z87891 Personal history of nicotine dependence: Secondary | ICD-10-CM | POA: Insufficient documentation

## 2013-03-24 DIAGNOSIS — R059 Cough, unspecified: Secondary | ICD-10-CM | POA: Insufficient documentation

## 2013-03-24 DIAGNOSIS — J45901 Unspecified asthma with (acute) exacerbation: Secondary | ICD-10-CM | POA: Insufficient documentation

## 2013-03-24 HISTORY — DX: Unspecified osteoarthritis, unspecified site: M19.90

## 2013-03-24 MED ORDER — ALBUTEROL SULFATE (5 MG/ML) 0.5% IN NEBU
5.0000 mg | INHALATION_SOLUTION | Freq: Once | RESPIRATORY_TRACT | Status: AC
  Administered 2013-03-24: 5 mg via RESPIRATORY_TRACT
  Filled 2013-03-24: qty 1

## 2013-03-24 MED ORDER — ALBUTEROL SULFATE HFA 108 (90 BASE) MCG/ACT IN AERS
2.0000 | INHALATION_SPRAY | Freq: Once | RESPIRATORY_TRACT | Status: AC
  Administered 2013-03-24: 2 via RESPIRATORY_TRACT
  Filled 2013-03-24: qty 6.7

## 2013-03-24 MED ORDER — IPRATROPIUM BROMIDE 0.02 % IN SOLN
0.5000 mg | Freq: Once | RESPIRATORY_TRACT | Status: AC
  Administered 2013-03-24: 0.5 mg via RESPIRATORY_TRACT
  Filled 2013-03-24: qty 2.5

## 2013-03-24 MED ORDER — PREDNISONE 20 MG PO TABS: ORAL_TABLET | ORAL | Status: DC

## 2013-03-24 NOTE — ED Notes (Signed)
Ambulated pt pt stayed between 99% and 100% while ambulating.

## 2013-03-24 NOTE — ED Notes (Signed)
Pt with hx of asthma to ED c/o sob starting today.  States albuterol inhaler is not working. Sats of 97%.

## 2013-03-24 NOTE — ED Provider Notes (Signed)
CSN: [id number]     Arrival date & time 03/24/13  1640 History  This chart was scribed for non-physician practitioner [name], PA-C working with Richardean Canal, MD by Danella Maiers, ED Scribe. This patient was seen in room TR10C/TR10C and the patient's care was started at 5:34 PM.    Chief Complaint  Patient presents with  . Shortness of Breath   The history is provided by the patient. No language interpreter was used.   HPI Comments: Joe Garcia is a 62 y.o. male with history of asthma who presents to the Emergency Department complaining of SOB described as an asthma attack that started today and he is still experiencing it. He claims he had trouble ambulating because he could not catch his breath. He has asthma attacks like this 2-3 times per year. Pt reports nasal congestion, clear rhinorrhea, and unproductive cough that started yesterday. Pt denies fever, chills, CP, headache, dyspnea, blurred vision, sore throat, throat swelling, and neck pain. Pt denies tobacco use. He had his albuterol refilled August 1 and is compliant but states it is not working.      Past Medical History  Diagnosis Date  . Asthma   . Hypertension   . Alcoholism   . Family history of anesthesia complication   . Depression   . Arthritis    History reviewed. No pertinent past surgical history. No family history on file. History  Substance Use Topics  . Smoking status: Former Games developer  . Smokeless tobacco: Not on file  . Alcohol Use: Yes     Comment: Pt states no etoh since April 2014    Review of Systems  Constitutional: Negative for fever and chills.  HENT: Positive for congestion and rhinorrhea. Negative for sore throat and neck pain.   Eyes: Negative for visual disturbance.  Respiratory: Positive for cough and shortness of breath.   Cardiovascular: Negative for chest pain.  Neurological: Negative for headaches.  All other systems reviewed and are negative.    Allergies   Penicillins  Home Medications   Current Outpatient Rx  Name  Route  Sig  Dispense  Refill  . albuterol (PROVENTIL HFA;VENTOLIN HFA) 108 (90 BASE) MCG/ACT inhaler   Inhalation   Inhale 2 puffs into the lungs every 6 (six) hours as needed for wheezing.   1 Inhaler   6   . predniSONE (DELTASONE) 20 MG tablet      3 tabs po daily x 3 days, then 2 tabs x 3 days, then 1.5 tabs x 3 days, then 1 tab x 3 days, then 0.5 tabs x 3 days   27 tablet   0    BP 162/83  Pulse 70  Temp(Src) 98.5 F (36.9 C) (Oral)  Resp 18  Ht 5\' 11"  (1.803 m)  SpO2 96% Physical Exam  Nursing note and vitals reviewed. Constitutional: He is oriented to person, place, and time. He appears well-developed and well-nourished. No distress.  HENT:  Head: Normocephalic and atraumatic.  Eyes: Conjunctivae and EOM are normal. Pupils are equal, round, and reactive to light. Right eye exhibits no discharge. Left eye exhibits no discharge.  Neck: Normal range of motion. Neck supple. No tracheal deviation present.  Cardiovascular: Normal rate, regular rhythm and normal heart sounds.  Exam reveals no friction rub.   No murmur heard. Cap refill < 3 seconds  Pulmonary/Chest: Effort normal. No respiratory distress. He has wheezes in the right lower field and the left upper field. He has no rales.  Lymphadenopathy:    He has no cervical adenopathy.  Neurological: He is alert and oriented to person, place, and time. He exhibits normal muscle tone. Coordination normal.  Skin: Skin is warm and dry. No rash noted. He is not diaphoretic. No erythema.  Psychiatric: He has a normal mood and affect. His behavior is normal. Thought content normal.    ED Course  Procedures (including critical care time)  Medications  albuterol (PROVENTIL) (5 MG/ML) 0.5% nebulizer solution 5 mg (5 mg Nebulization Given 03/24/13 1703)  albuterol (PROVENTIL) (5 MG/ML) 0.5% nebulizer solution 5 mg (5 mg Nebulization Given 03/24/13 1807)  ipratropium  (ATROVENT) nebulizer solution 0.5 mg (0.5 mg Nebulization Given 03/24/13 1815)  albuterol (PROVENTIL HFA;VENTOLIN HFA) 108 (90 BASE) MCG/ACT inhaler 2 puff (2 puffs Inhalation Given 03/24/13 1847)   DIAGNOSTIC STUDIES: Oxygen Saturation is 96% on room air, normal by my interpretation.    COORDINATION OF CARE: 6:00 PM- Discussed treatment plan with pt which includes a prescription for albuterol and pt agrees to plan.  8:00 PM patient reassessed with negative signs of wheezing noted. Patient denied chest pain, shortness of breath, difficulty breathing. Patient ambulated well with pulse ox reading of 99-100%. Patient given albuterol inhaler. Patient reported that he is feeling better and breathing more easily, patient reported that he is ready to go home.  Labs Review Labs Reviewed - No data to display Imaging Review Dg Chest 2 View (if Patient Has Fever And/or Copd)  03/24/2013   *RADIOLOGY REPORT*  Clinical Data: Cough, shortness of breath  CHEST - 2 VIEW  Comparison: 12/05/2009  Findings: Lungs are essentially clear.  No focal consolidation. Mild hyperinflation. No pleural effusion or pneumothorax.  The heart normal in size.  Degenerative changes of the visualized thoracolumbar spine.  IMPRESSION: No evidence of acute cardiopulmonary disease.   Original Report Authenticated By: Charline Bills, M.D.    MDM   1. Asthma exacerbation   2. HTN (hypertension)   3. Arthritis    I personally performed the services described in this documentation, which was scribed in my presence. The recorded information has been reviewed and is accurate.  Patient presenting to the emergency department with asthma exacerbation and in need of a new inhaler since albuterol inhaler is no longer working properly, as per patient. Alert and oriented. Wheezing noted upon initial examination. Patient given with nebulizer treatments of albuterol and ipratropium patient felt better and breathing improved. Capillary refill  less than 3 seconds. Negative clubbing noted to the fingertips. Pulses palpable. When reassessed lungs are clear to auscultation, wheezing not noted.  Negative acute cardiopulmonary disease noted to chest x-ray. Patient stable, afebrile. Negative signs of distress or respiratory distress. Denied throat closing sensation, chest pain, shortness of breath, difficulty breathing after treatments. Pulse ox 99-100% with ambulation. Patient given albuterol inhaler for home. Discharge patient with prednisone. Discussed with patient to followup with primary care provider first thing tomorrow morning, as well as [facility]. Discussed with patient to continue to monitor symptoms and if symptoms are to worsen or change to report back to emergency department-strict return instructions given. Patient agreed to plan of care, understood, all questions answered.   [name], PA-C 03/25/13 1240

## 2013-03-27 NOTE — ED Provider Notes (Signed)
Medical screening examination/treatment/procedure(s) were performed by non-physician practitioner and as supervising physician I was immediately available for consultation/collaboration.   Otha Rickles H Joanell Cressler, MD 03/27/13 2131 

## 2013-07-03 ENCOUNTER — Encounter (HOSPITAL_COMMUNITY): Payer: Self-pay | Admitting: Emergency Medicine

## 2013-07-03 ENCOUNTER — Emergency Department (HOSPITAL_COMMUNITY)
Admission: EM | Admit: 2013-07-03 | Discharge: 2013-07-03 | Disposition: A | Discharge status: Home / Self Care | Payer: Medicaid Other | Attending: Emergency Medicine | Admitting: Emergency Medicine

## 2013-07-03 DIAGNOSIS — F3289 Other specified depressive episodes: Secondary | ICD-10-CM | POA: Insufficient documentation

## 2013-07-03 DIAGNOSIS — R609 Edema, unspecified: Secondary | ICD-10-CM | POA: Insufficient documentation

## 2013-07-03 DIAGNOSIS — J45909 Unspecified asthma, uncomplicated: Secondary | ICD-10-CM | POA: Insufficient documentation

## 2013-07-03 DIAGNOSIS — M129 Arthropathy, unspecified: Secondary | ICD-10-CM | POA: Insufficient documentation

## 2013-07-03 DIAGNOSIS — I10 Essential (primary) hypertension: Secondary | ICD-10-CM | POA: Insufficient documentation

## 2013-07-03 DIAGNOSIS — R21 Rash and other nonspecific skin eruption: Secondary | ICD-10-CM | POA: Insufficient documentation

## 2013-07-03 DIAGNOSIS — Z79899 Other long term (current) drug therapy: Secondary | ICD-10-CM | POA: Insufficient documentation

## 2013-07-03 DIAGNOSIS — F329 Major depressive disorder, single episode, unspecified: Secondary | ICD-10-CM | POA: Insufficient documentation

## 2013-07-03 DIAGNOSIS — Z87891 Personal history of nicotine dependence: Secondary | ICD-10-CM | POA: Insufficient documentation

## 2013-07-03 DIAGNOSIS — L539 Erythematous condition, unspecified: Secondary | ICD-10-CM | POA: Insufficient documentation

## 2013-07-03 MED ORDER — RANITIDINE HCL 150 MG PO TABS: 150.0000 mg | ORAL_TABLET | Freq: Two times a day (BID) | ORAL | Status: DC

## 2013-07-03 MED ORDER — PREDNISONE 20 MG PO TABS
60.0000 mg | ORAL_TABLET | Freq: Once | ORAL | Status: AC
  Administered 2013-07-03: 60 mg via ORAL
  Filled 2013-07-03: qty 3

## 2013-07-03 MED ORDER — PREDNISONE 50 MG PO TABS: 50.0000 mg | ORAL_TABLET | Freq: Every day | ORAL | Status: DC

## 2013-07-03 MED ORDER — DIPHENHYDRAMINE HCL 25 MG PO CAPS: 25.0000 mg | ORAL_CAPSULE | Freq: Four times a day (QID) | ORAL | Status: DC | PRN

## 2013-07-03 NOTE — ED Notes (Signed)
Pt reports reheated left over dinner from last night and ate it this morning. Pt went back to bed and woke up itching in genital area and on right arm. Pt presents with redness and swelling to right arm. Pt reports whelps to groin area. Pt took benadryl PTA with some relief.

## 2013-07-03 NOTE — ED Notes (Signed)
Pt undressed, in gown, on continuous pulse oximetry and blood pressure cuff 

## 2013-07-03 NOTE — ED Provider Notes (Signed)
CSN: [id number]     Arrival date & time 07/03/13  0912 History   First MD Initiated Contact with Patient 07/03/13 [phone]     Chief Complaint  Patient presents with  . Rash   (Consider location/radiation/quality/duration/timing/severity/associated sxs/prior Treatment) HPI Comments: Pt comes in with cc of rash. States that he all of a sudden developed a rash and itching to the right forearm, and in the perineal area. He though he notices some wheals, and swelling around his penis - and took benadryl. No specific exposures, no new meds, and no hx of similar sx in the past. Pt states that overtime his sx have improved, especially the swelling. No wheezing and no oral complains.  Patient is a 62 y.o. male presenting with rash. The history is provided by the patient.  Rash Associated symptoms: no fever and not wheezing     Past Medical History  Diagnosis Date  . Asthma   . Hypertension   . Alcoholism   . Family history of anesthesia complication   . Depression   . Arthritis    History reviewed. No pertinent past surgical history. No family history on file. History  Substance Use Topics  . Smoking status: Former Games developer  . Smokeless tobacco: Not on file  . Alcohol Use: Yes     Comment: Pt states no etoh since April 2014    Review of Systems  Constitutional: Negative for fever and chills.  HENT: Negative for facial swelling.   Eyes: Negative for itching.  Respiratory: Negative for cough, wheezing and stridor.   Skin: Positive for rash.    Allergies  Penicillins  Home Medications   Current Outpatient Rx  Name  Route  Sig  Dispense  Refill  . albuterol (PROVENTIL HFA;VENTOLIN HFA) 108 (90 BASE) MCG/ACT inhaler   Inhalation   Inhale 2 puffs into the lungs every 6 (six) hours as needed for wheezing.   1 Inhaler   6    BP 183/87  Pulse 54  Temp(Src) 97.4 F (36.3 C) (Oral)  Resp 20  Ht 5\' 11"  (1.803 m)  Wt 148 lb (67.132 kg)  BMI 20.65 kg/m2  SpO2 99% Physical Exam   Nursing note and vitals reviewed. Constitutional: He is oriented to person, place, and time. He appears well-developed.  HENT:  Head: Normocephalic and atraumatic.  Eyes: Conjunctivae and EOM are normal. Pupils are equal, round, and reactive to light.  Neck: Normal range of motion. Neck supple.  Cardiovascular: Normal rate and regular rhythm.   Pulmonary/Chest: Breath sounds normal. No respiratory distress. He has no wheezes.  Abdominal: Soft. Bowel sounds are normal. There is no tenderness. There is no rebound and no guarding.  Neurological: He is alert and oriented to person, place, and time.  Skin: Skin is warm. Rash noted.  Mild erythema to the forearm, and also in the perineal region. No specific wheal like patter noted. No penile or scrotal erythema or swelling, and testicles are non tender. No rash in the scrotal region or over the penis.    ED Course  Procedures (including critical care time) Labs Review Labs Reviewed - No data to display Imaging Review No results found.  EKG Interpretation   None       MDM  No diagnosis found.  PT comes in with cc of rash and itching. Based on hx and exam, appears to be some non specific hypersensitivity type reaction. He took benadryl PTA, and feels better. No oral involvement. Will d.c   Katianne Barre,  MD 07/03/13 1037

## 2013-07-16 ENCOUNTER — Inpatient Hospital Stay (HOSPITAL_COMMUNITY)
Admission: EM | Admit: 2013-07-16 | Discharge: 2013-07-18 | DRG: 192 | Disposition: A | Discharge status: Home / Self Care | Payer: Medicaid Other | Attending: Internal Medicine | Admitting: Internal Medicine

## 2013-07-16 ENCOUNTER — Encounter (HOSPITAL_COMMUNITY): Payer: Self-pay | Admitting: Emergency Medicine

## 2013-07-16 ENCOUNTER — Emergency Department (HOSPITAL_COMMUNITY): Payer: Medicaid Other

## 2013-07-16 DIAGNOSIS — Z87891 Personal history of nicotine dependence: Secondary | ICD-10-CM

## 2013-07-16 DIAGNOSIS — F17200 Nicotine dependence, unspecified, uncomplicated: Secondary | ICD-10-CM

## 2013-07-16 DIAGNOSIS — R0609 Other forms of dyspnea: Secondary | ICD-10-CM | POA: Diagnosis present

## 2013-07-16 DIAGNOSIS — J111 Influenza due to unidentified influenza virus with other respiratory manifestations: Secondary | ICD-10-CM | POA: Diagnosis present

## 2013-07-16 DIAGNOSIS — F1020 Alcohol dependence, uncomplicated: Secondary | ICD-10-CM

## 2013-07-16 DIAGNOSIS — R000 Tachycardia, unspecified: Secondary | ICD-10-CM | POA: Diagnosis present

## 2013-07-16 DIAGNOSIS — J441 Chronic obstructive pulmonary disease with (acute) exacerbation: Principal | ICD-10-CM | POA: Diagnosis present

## 2013-07-16 DIAGNOSIS — I10 Essential (primary) hypertension: Secondary | ICD-10-CM | POA: Diagnosis present

## 2013-07-16 DIAGNOSIS — M8700 Idiopathic aseptic necrosis of unspecified bone: Secondary | ICD-10-CM

## 2013-07-16 DIAGNOSIS — IMO0002 Reserved for concepts with insufficient information to code with codable children: Secondary | ICD-10-CM

## 2013-07-16 DIAGNOSIS — R509 Fever, unspecified: Secondary | ICD-10-CM | POA: Diagnosis present

## 2013-07-16 DIAGNOSIS — R7309 Other abnormal glucose: Secondary | ICD-10-CM | POA: Diagnosis present

## 2013-07-16 DIAGNOSIS — F1010 Alcohol abuse, uncomplicated: Secondary | ICD-10-CM

## 2013-07-16 DIAGNOSIS — J101 Influenza due to other identified influenza virus with other respiratory manifestations: Secondary | ICD-10-CM | POA: Diagnosis present

## 2013-07-16 DIAGNOSIS — R0602 Shortness of breath: Secondary | ICD-10-CM

## 2013-07-16 LAB — URINALYSIS, ROUTINE W REFLEX MICROSCOPIC
Glucose, UA: NEGATIVE mg/dL
Hgb urine dipstick: NEGATIVE
Ketones, ur: NEGATIVE mg/dL
Leukocytes, UA: NEGATIVE
Protein, ur: NEGATIVE mg/dL

## 2013-07-16 LAB — CBC WITH DIFFERENTIAL/PLATELET
Basophils Absolute: 0 10*3/uL (ref 0.0–0.1)
Eosinophils Absolute: 0 10*3/uL (ref 0.0–0.7)
Eosinophils Relative: 0 % (ref 0–5)
HCT: 44.9 % (ref 39.0–52.0)
Lymphocytes Relative: 15 % (ref 12–46)
MCH: 29.4 pg (ref 26.0–34.0)
MCHC: 34.5 g/dL (ref 30.0–36.0)
MCV: 85 fL (ref 78.0–100.0)
Monocytes Absolute: 0.4 10*3/uL (ref 0.1–1.0)
RDW: 15.2 % (ref 11.5–15.5)

## 2013-07-16 LAB — MAGNESIUM: Magnesium: 1.8 mg/dL (ref 1.5–2.5)

## 2013-07-16 LAB — BASIC METABOLIC PANEL
CO2: 21 mEq/L (ref 19–32)
Calcium: 8.9 mg/dL (ref 8.4–10.5)
Creatinine, Ser: 0.99 mg/dL (ref 0.50–1.35)

## 2013-07-16 LAB — GLUCOSE, CAPILLARY

## 2013-07-16 LAB — INFLUENZA PANEL BY PCR (TYPE A & B)
Influenza A By PCR: POSITIVE — AB
Influenza B By PCR: NEGATIVE

## 2013-07-16 MED ORDER — PREDNISONE 20 MG PO TABS
60.0000 mg | ORAL_TABLET | Freq: Once | ORAL | Status: AC
  Administered 2013-07-16: 60 mg via ORAL
  Filled 2013-07-16: qty 3

## 2013-07-16 MED ORDER — BUDESONIDE 0.25 MG/2ML IN SUSP
0.2500 mg | Freq: Two times a day (BID) | RESPIRATORY_TRACT | Status: DC
  Administered 2013-07-16 – 2013-07-18 (×4): 0.25 mg via RESPIRATORY_TRACT
  Filled 2013-07-16 (×6): qty 2

## 2013-07-16 MED ORDER — ACETAMINOPHEN 325 MG PO TABS
650.0000 mg | ORAL_TABLET | Freq: Once | ORAL | Status: AC
  Administered 2013-07-16: 650 mg via ORAL
  Filled 2013-07-16: qty 2

## 2013-07-16 MED ORDER — INSULIN ASPART 100 UNIT/ML ~~LOC~~ SOLN
0.0000 [IU] | Freq: Three times a day (TID) | SUBCUTANEOUS | Status: DC
  Administered 2013-07-17 – 2013-07-18 (×4): 1 [IU] via SUBCUTANEOUS

## 2013-07-16 MED ORDER — SODIUM CHLORIDE 0.9 % IJ SOLN
3.0000 mL | Freq: Two times a day (BID) | INTRAMUSCULAR | Status: DC
  Administered 2013-07-16: 3 mL via INTRAVENOUS

## 2013-07-16 MED ORDER — ACETAMINOPHEN 325 MG PO TABS: 650.0000 mg | ORAL_TABLET | Freq: Four times a day (QID) | ORAL | Status: DC | PRN

## 2013-07-16 MED ORDER — OSELTAMIVIR PHOSPHATE 75 MG PO CAPS
75.0000 mg | ORAL_CAPSULE | Freq: Two times a day (BID) | ORAL | Status: DC
  Administered 2013-07-16 – 2013-07-18 (×4): 75 mg via ORAL
  Filled 2013-07-16 (×5): qty 1

## 2013-07-16 MED ORDER — SODIUM CHLORIDE 0.9 % IV SOLN
INTRAVENOUS | Status: DC
  Administered 2013-07-16 – 2013-07-18 (×5): via INTRAVENOUS

## 2013-07-16 MED ORDER — PANTOPRAZOLE SODIUM 40 MG PO TBEC
40.0000 mg | DELAYED_RELEASE_TABLET | Freq: Every day | ORAL | Status: DC
  Administered 2013-07-17 – 2013-07-18 (×2): 40 mg via ORAL
  Filled 2013-07-16 (×2): qty 1

## 2013-07-16 MED ORDER — HEPARIN SODIUM (PORCINE) 5000 UNIT/ML IJ SOLN
5000.0000 [IU] | Freq: Three times a day (TID) | INTRAMUSCULAR | Status: DC
  Administered 2013-07-16 – 2013-07-18 (×5): 5000 [IU] via SUBCUTANEOUS
  Filled 2013-07-16 (×8): qty 1

## 2013-07-16 MED ORDER — SODIUM CHLORIDE 0.9 % IV BOLUS (SEPSIS)
1000.0000 mL | Freq: Once | INTRAVENOUS | Status: AC
  Administered 2013-07-16: 1000 mL via INTRAVENOUS

## 2013-07-16 MED ORDER — ACETAMINOPHEN 650 MG RE SUPP: 650.0000 mg | Freq: Four times a day (QID) | RECTAL | Status: DC | PRN

## 2013-07-16 MED ORDER — GUAIFENESIN-DM 100-10 MG/5ML PO SYRP
5.0000 mL | ORAL_SOLUTION | ORAL | Status: DC | PRN
  Filled 2013-07-16: qty 5

## 2013-07-16 MED ORDER — HYDRALAZINE HCL 20 MG/ML IJ SOLN
10.0000 mg | Freq: Three times a day (TID) | INTRAMUSCULAR | Status: DC | PRN
  Administered 2013-07-17: 10 mg via INTRAVENOUS
  Filled 2013-07-16: qty 1

## 2013-07-16 MED ORDER — ALBUTEROL SULFATE (5 MG/ML) 0.5% IN NEBU
2.5000 mg | INHALATION_SOLUTION | RESPIRATORY_TRACT | Status: DC | PRN
  Administered 2013-07-17: 2.5 mg via RESPIRATORY_TRACT
  Filled 2013-07-16: qty 0.5

## 2013-07-16 MED ORDER — OXYCODONE HCL 5 MG PO TABS
5.0000 mg | ORAL_TABLET | ORAL | Status: DC | PRN
  Administered 2013-07-16 – 2013-07-17 (×2): 5 mg via ORAL
  Filled 2013-07-16 (×2): qty 1

## 2013-07-16 MED ORDER — ALBUTEROL SULFATE (5 MG/ML) 0.5% IN NEBU
5.0000 mg | INHALATION_SOLUTION | Freq: Once | RESPIRATORY_TRACT | Status: AC
  Administered 2013-07-16: 5 mg via RESPIRATORY_TRACT
  Filled 2013-07-16: qty 1

## 2013-07-16 MED ORDER — IPRATROPIUM BROMIDE 0.02 % IN SOLN
0.5000 mg | Freq: Four times a day (QID) | RESPIRATORY_TRACT | Status: DC
  Administered 2013-07-16 – 2013-07-18 (×6): 0.5 mg via RESPIRATORY_TRACT
  Filled 2013-07-16 (×6): qty 2.5

## 2013-07-16 MED ORDER — METHYLPREDNISOLONE SODIUM SUCC 125 MG IJ SOLR
80.0000 mg | Freq: Three times a day (TID) | INTRAMUSCULAR | Status: DC
  Administered 2013-07-16 – 2013-07-18 (×5): 80 mg via INTRAVENOUS
  Filled 2013-07-16 (×8): qty 1.28

## 2013-07-16 MED ORDER — ALBUTEROL SULFATE (5 MG/ML) 0.5% IN NEBU
2.5000 mg | INHALATION_SOLUTION | Freq: Four times a day (QID) | RESPIRATORY_TRACT | Status: DC
  Administered 2013-07-16 – 2013-07-18 (×7): 2.5 mg via RESPIRATORY_TRACT
  Filled 2013-07-16 (×7): qty 0.5

## 2013-07-16 NOTE — ED Notes (Signed)
Ambulated patient. HR and O2 sats prior to ambulation were 95% RA and HR 100. During ambulation and just after O2 sat was 96-97 and HR 130. Ambulated for approx. 75ft.

## 2013-07-16 NOTE — ED Provider Notes (Signed)
CSN: [id number]     Arrival date & time 07/16/13  1213 History   First MD Initiated Contact with Patient 07/16/13 1236     Chief Complaint  Patient presents with  . Shortness of Breath   (Consider location/radiation/quality/duration/timing/severity/associated sxs/prior Treatment) Patient is a 62 y.o. male presenting with shortness of breath.  Shortness of Breath  Pt with history of asthma reports onset of SOB, cough, feels like he has sputum but can't cough it up, also has had some post-tussive emesis. Did not know he was febrile, thought it was his asthma. Reports no smoking or drinking in many months. Not improved with home meds.   Past Medical History  Diagnosis Date  . Asthma   . Hypertension   . Alcoholism   . Family history of anesthesia complication   . Depression   . Arthritis    History reviewed. No pertinent past surgical history. No family history on file. History  Substance Use Topics  . Smoking status: Former Games developer  . Smokeless tobacco: Not on file  . Alcohol Use: Yes     Comment: Pt states no etoh since April 2014    Review of Systems  Respiratory: Positive for shortness of breath.    All other systems reviewed and are negative except as noted in HPI.   Allergies  Penicillins  Home Medications   Current Outpatient Rx  Name  Route  Sig  Dispense  Refill  . albuterol (PROVENTIL HFA;VENTOLIN HFA) 108 (90 BASE) MCG/ACT inhaler   Inhalation   Inhale 2 puffs into the lungs every 6 (six) hours as needed for wheezing.   1 Inhaler   6   . diphenhydrAMINE (BENADRYL) 25 mg capsule   Oral   Take 1 capsule (25 mg total) by mouth every 6 (six) hours as needed for itching.   30 capsule   0    BP 169/81  Pulse 102  Temp(Src) 102.8 F (39.3 C) (Oral)  Resp 16  SpO2 96% Physical Exam  Nursing note and vitals reviewed. Constitutional: He is oriented to person, place, and time. He appears well-developed and well-nourished.  HENT:  Head: Normocephalic and  atraumatic.  Eyes: EOM are normal. Pupils are equal, round, and reactive to light.  Neck: Normal range of motion. Neck supple.  Cardiovascular: Normal rate, normal heart sounds and intact distal pulses.   Pulmonary/Chest: Effort normal. He has wheezes. He has rales (bibasilar).  Abdominal: Bowel sounds are normal. He exhibits no distension. There is no tenderness.  Musculoskeletal: Normal range of motion. He exhibits no edema and no tenderness.  Neurological: He is alert and oriented to person, place, and time. He has normal strength. No cranial nerve deficit or sensory deficit.  Skin: Skin is warm and dry. No rash noted.  Psychiatric: He has a normal mood and affect.    ED Course  Procedures (including critical care time) Labs Review Labs Reviewed  CBC WITH DIFFERENTIAL - Abnormal; Notable for the following:    Neutrophils Relative % 80 (*)    All other components within normal limits  BASIC METABOLIC PANEL - Abnormal; Notable for the following:    GFR calc non Af Amer 87 (*)    All other components within normal limits  URINALYSIS, ROUTINE W REFLEX MICROSCOPIC  INFLUENZA PANEL BY PCR  URINE RAPID DRUG SCREEN (HOSP PERFORMED)  ETHANOL   Imaging Review Dg Chest 2 View  07/16/2013   CLINICAL DATA:  Cough, dyspnea, and fever, history of asthma  EXAM: CHEST  2 VIEW  COMPARISON:  PA and lateral chest x-ray of March 24, 2013.  FINDINGS: The lungs are hyperinflated. There is no focal infiltrate. The pulmonary vascularity is not engorged. The cardiopericardial silhouette is normal in size. The mediastinum is normal in width. There is no pleural effusion or pneumothorax. 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  IMPRESSION: There is no evidence of pneumonia nor CHF. Mild hyperinflation is consistent with reactive airway disease or COPD.   Electronically Signed   By: David  [place]   On: 07/16/2013 13:48     EKG Interpretation   None       MDM   1. Influenza   2. Asthma exacerbation in COPD   3. Fever   4. SOB (shortness of breath)   5. Tachycardia   6. Unspecified essential hypertension    Temp improved but still tachycardic with walking, HR into 130s. Suspect influenza, will admit for hydration and further eval.      Shaton Lore B. Bernette Mayers, MD 07/16/13 1945

## 2013-07-16 NOTE — ED Notes (Signed)
Pt. Stated, It started yesterday my asthmas, i was SOB last night and this morning.

## 2013-07-16 NOTE — Progress Notes (Signed)
Attempted to call to receive report.  Placed on hold for 5 minutes with no answer. Will attempt to call again later. Archita Lomeli, [facility]

## 2013-07-16 NOTE — H&P (Signed)
[facility] History and Physical  Joe Garcia FAO:[id number] DOB: 1951/01/14 DOA: 07/16/2013  Referring physician: Dr. Bernette Mayers PCP: No PCP Per Patient   Chief Complaint: fever, general malaise, cough and SOB  HPI: Joe Garcia is a 62 y.o. male with PMH significant for HTN, asthma/COPD and prior history of alcohol abuse and tobacco abuse; came to [facility] complaining of worsening SOB/wheezing, cough, fever and general malaise. Patient reports symptoms present for the last 2 days and worsening; reported no improvement with inhaler at home. Patient endorses pot-tussive vomiting. Denies CP, hematemesis, HA's, melena, abd pain, dysuria, sick contacts, recent use of tobacco, alcohol or any recreational drug. Not flu vaccine this year.    Review of Systems:  Constitutional:  No weight loss, night sweats, Fevers, chills, fatigue.  HEENT:  No headaches, Difficulty swallowing,Tooth/dental problems,Sore throat,  No sneezing, itching, ear ache, nasal congestion, post nasal drip,  Cardio-vascular:  No chest pain, Orthopnea, PND, swelling in lower extremities, anasarca, dizziness, palpitations  GI:  No heartburn, indigestion, abdominal pain, nausea, vomiting, diarrhea, change in bowel habits, loss of appetite  Resp:  No shortness of breath with exertion or at rest. No excess mucus, no productive cough, No non-productive cough, No coughing up of blood.No change in color of mucus.No wheezing.No chest wall deformity  Skin:  no rash or lesions.  GU:  no dysuria, change in color of urine, no urgency or frequency. No flank pain.  Musculoskeletal:  No joint pain or swelling. No decreased range of motion. No back pain.  Psych:  No change in mood or affect. No depression or anxiety. No memory loss.   Past Medical History  Diagnosis Date  . Asthma   . Hypertension   . Alcoholism   . Family history of anesthesia complication   . Depression   . Arthritis    History reviewed. No pertinent  past surgical history. Social History:  reports that he has quit smoking. He does not have any smokeless tobacco history on file. He reports that he drinks alcohol. He reports that he does not use illicit drugs.  Allergies  Allergen Reactions  . Penicillins     REACTION: "stomach pains"    Family history: positive for HTN, otherwise non contributory according to patient.  Prior to Admission medications   Medication Sig Start Date End Date Taking? Authorizing Provider  albuterol (PROVENTIL HFA;VENTOLIN HFA) 108 (90 BASE) MCG/ACT inhaler Inhale 2 puffs into the lungs every 6 (six) hours as needed for wheezing. 11/01/12  Yes Nanine Means, NP  diphenhydrAMINE (BENADRYL) 25 mg capsule Take 1 capsule (25 mg total) by mouth every 6 (six) hours as needed for itching. 07/03/13  Yes Derwood Kaplan, MD   Physical Exam: Filed Vitals:   07/16/13 1654  BP: 166/80  Pulse: 98  Temp:   Resp: 18    BP 166/80  Pulse 98  Temp(Src) 100.5 F (38.1 C) (Oral)  Resp 18  SpO2 96%  General:  Warm to touch, mild distress due to SOB feeling and cough Eyes: PERRL, normal lids, irises & conjunctiva; no icterus or nystagmus ENT: grossly normal hearing, dry MM, no erythema, exudates or thrush on exam Neck: no LAD, masses or thyromegaly Cardiovascular: tachycardia, no m/r/g. No LE edema. No JVD Respiratory: diffuse exp wheezing, no crackles or rales Abdomen: soft, nt, nd and positive BS Skin: no rash or induration seen on skin exam Musculoskeletal: grossly normal tone BUE/BLE Psychiatric: grossly normal mood and affect, speech fluent and appropriate Neurologic: grossly non-focal.  Labs on Admission:  Basic Metabolic Panel:  Recent Labs Lab 07/16/13 1240  NA 135  K 3.5  CL 101  CO2 21  GLUCOSE 98  BUN 8  CREATININE 0.99  CALCIUM 8.9   CBC:  Recent Labs Lab 07/16/13 1240  WBC 7.3  NEUTROABS 5.8  HGB 15.5  HCT 44.9  MCV 85.0  PLT 185   Radiological Exams on Admission: Dg Chest  2 View  07/16/2013   CLINICAL DATA:  Cough, dyspnea, and fever, history of asthma  EXAM: CHEST  2 VIEW  COMPARISON:  PA and lateral chest x-ray of March 24, 2013.  FINDINGS: The lungs are hyperinflated. There is no focal infiltrate. The pulmonary vascularity is not engorged. The cardiopericardial silhouette is normal in size. The mediastinum is normal in width. There is no pleural effusion or pneumothorax. 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  IMPRESSION: There is no evidence of pneumonia nor CHF. Mild hyperinflation is consistent with reactive airway disease or COPD.   Electronically Signed   By: David  [place]   On: 07/16/2013 13:48    EKG:  Sinus tachycardia, no acute ischemic changes.  Assessment/Plan 1-Asthma exacerbation in presumed COPD: patient with SOB and diffuse exp wheezing. Most likely due to flu or URI as precipitating factor for exacerbation -admit to telemetry bed due to tachycardia -check influenza PCR -dual nebulizer treatment -IVF's and supportive care -start solumedrol -will start patient on tamiflu -start pulmicort -repeat CXR in am -will hold antibiotics at this moment; WBC's WNL and patient main complaint is fever/cough (initial CXR negative) -had never has PFT's in the past -PRN antitussive   2-HYPERTENSION: overall stable and well controlled. -was not taking any meds at home -heart healthy diet -PRN hydralazine  3-Influenza: will check influenza PCR and start empiric tamiflu  4-SOB (shortness of breath): due to # 1. Will provide PRN oxygen supplementation and treatment as mentioned above  5-Fever: due to #3. Will use PRN antipyretics   6-Tachycardia: will check TSH and Mg level. -most likely due to mild dehydration, fever and use of albuterol -will provide IVF's -will check UDS  7-post-tussive vomiting: will add PRN antiemetics  8-hx of alcohol and tobacco  abuse: patient reports more than 8 month w/o alcohol or smoking. -will monitor closely -check ETOH level  GI and DVT prophylaxis: protonix and heparin   Code Status: Full Family Communication: no family at bedside Disposition Plan: LOS < 2 midnights, telemetry, observation  Time spent: 50 minutes  Jonta Gastineau [facility] Pager [phone]

## 2013-07-17 ENCOUNTER — Encounter (HOSPITAL_COMMUNITY): Payer: Self-pay | Admitting: General Practice

## 2013-07-17 ENCOUNTER — Observation Stay (HOSPITAL_COMMUNITY): Payer: Medicaid Other

## 2013-07-17 DIAGNOSIS — J101 Influenza due to other identified influenza virus with other respiratory manifestations: Secondary | ICD-10-CM | POA: Diagnosis present

## 2013-07-17 DIAGNOSIS — F1010 Alcohol abuse, uncomplicated: Secondary | ICD-10-CM

## 2013-07-17 LAB — BASIC METABOLIC PANEL
CO2: 20 mEq/L (ref 19–32)
Calcium: 8.7 mg/dL (ref 8.4–10.5)
Creatinine, Ser: 0.97 mg/dL (ref 0.50–1.35)
GFR calc non Af Amer: 88 mL/min — ABNORMAL LOW (ref >90)
Glucose, Bld: 140 mg/dL — ABNORMAL HIGH (ref 70–99)
Sodium: 135 mEq/L (ref 135–145)

## 2013-07-17 LAB — CBC
HCT: 41.8 % (ref 39.0–52.0)
MCH: 28.6 pg (ref 26.0–34.0)
Platelets: 189 10*3/uL (ref 150–400)
RBC: 4.93 MIL/uL (ref 4.22–5.81)

## 2013-07-17 LAB — GLUCOSE, CAPILLARY: Glucose-Capillary: 150 mg/dL — ABNORMAL HIGH (ref 70–99)

## 2013-07-17 LAB — TSH: TSH: 0.603 u[IU]/mL (ref 0.350–4.500)

## 2013-07-17 NOTE — Progress Notes (Addendum)
[facility]                                                                                Patient Demographics  Joe Garcia, is a 62 y.o. male, DOB - 1950/12/15, ZOX:[id number]  Admit date - 07/16/2013   Admitting Physician No admitting provider for patient encounter.  Outpatient Primary MD for the patient is No PCP Per Patient  LOS - 1   Chief Complaint  Patient presents with  . Shortness of Breath        Assessment & Plan    Principal Problem:   Asthma exacerbation in COPD Active Problems:   HYPERTENSION   Influenza   SOB (shortness of breath)   Fever   Tachycardia   Influenza A   Asthma exacerbation likely secondary to influenza -Influenza A was positive -Continue Tamiflu, nebulizer treatments, Solu-Medrol -Will likely need PFTs once discharged as outpatient. -Chest x-ray showed COPD, no active disease.  Dyspnea secondary to asthma exacerbation -Treatment and plan as stated above  Hypertension -Currently on no medications at home, stable. -Continue her healthy diet and hydralazine as needed.  Tachycardia -Likely secondary to dyspnea and asthma exacerbation along with mild dehydration as well as albuterol meds -Will continue to monitor.  Nausea, posttussive -Will continue antiemetics as needed -Will continue Protonix  History of alcohol and tobacco abuse -Alcohol level less than 11 -Will continue to monitor -Smoking cessation counseled  Hyperglycemia -Likely secondary to Solu-Medrol -Will continue to monitor  Code Status: Full  Family Communication: None at this time.  Disposition Plan: Admitted, will likely discharge with medically stable   Procedures none  Consults  none  DVT Prophylaxis  heparin  Lab Results  Component Value Date   PLT 189 07/17/2013    Medications  Scheduled Meds: . albuterol  2.5 mg Nebulization QID  . budesonide (PULMICORT) nebulizer solution  0.25 mg Nebulization BID  . heparin  5,000 Units  Subcutaneous Q8H  . insulin aspart  0-9 Units Subcutaneous TID WC  . ipratropium  0.5 mg Nebulization QID  . methylPREDNISolone (SOLU-MEDROL) injection  80 mg Intravenous Q8H  . oseltamivir  75 mg Oral BID  . pantoprazole  40 mg Oral Q1200  . sodium chloride  3 mL Intravenous Q12H   Continuous Infusions: . sodium chloride 125 mL/hr at 07/17/13 0555   PRN Meds:.acetaminophen, acetaminophen, albuterol, guaiFENesin-dextromethorphan, hydrALAZINE, oxyCODONE  Antibiotics    Anti-infectives   Start     Dose/Rate Route Frequency Ordered Stop   07/16/13 2200  oseltamivir (TAMIFLU) capsule 75 mg     75 mg Oral 2 times daily 07/16/13 2006 07/21/13 2159       Time Spent in minutes   30 minutes   Carylon Tamburro D.O. on 07/17/2013 at 11:58 AM  Between 7am to 7pm - Pager - [phone]  After 7pm go to [email or web address] - password [email or web address]  And look for the night coverage person covering for me after hours  [facility] Office  [phone]    Subjective:   Joe Garcia seen and examined today.  Patient currently can planes of feeling achy all over, continues to have cough and congestion. Patient does have some nausea as well.  Denies any chest pain or abdominal pain or active vomiting.  Objective:   Filed Vitals:   07/16/13 2026 07/17/13 0500 07/17/13 0827 07/17/13 0856  BP:  184/86 163/76   Pulse:  74 90 88  Temp:  100.2 F (37.9 C) 99.1 F (37.3 C)   TempSrc:  Oral Oral   Resp:  18 17 18   Height:      Weight:      SpO2: 98% 94% 97%     Wt Readings from Last 3 Encounters:  07/16/13 72.3 kg (159 lb 6.3 oz)  07/03/13 67.132 kg (148 lb)  10/29/12 67.132 kg (148 lb)     Intake/Output Summary (Last 24 hours) at 07/17/13 1158 Last data filed at 07/17/13 0827  Gross per 24 hour  Intake 963.75 ml  Output   1150 ml  Net -186.25 ml    Exam  General: Well developed, well nourished, mild distress, appears stated age  HEENT: NCAT, PERRLA, EOMI, Anicteic  Sclera, mucous membranes moist. No pharyngeal erythema or exudates  Neck: Supple, no JVD, no masses  Cardiovascular: S1 S2 auscultated, no rubs, murmurs or gallops. Regular rate and rhythm.  Respiratory: Diminished breath sounds, expiratory wheezing noted.  Abdomen: Soft, nontender, nondistended, + bowel sounds  Extremities: warm dry without cyanosis clubbing or edema  Neuro: AAOx3, cranial nerves grossly intact. Strength 5/5 in patient's upper and lower extremities bilaterally  Skin: Without rashes exudates or nodules  Psych: Normal affect and demeanor with intact judgement and insight   Data Review   Micro Results No results found for this or any previous visit (from the past 240 hour(s)).  Radiology Reports Dg Chest 2 View  07/17/2013   CLINICAL DATA:  Shortness of breath, fever.  Question pneumonia.  EXAM: CHEST  2 VIEW  COMPARISON:  07/16/2013  FINDINGS: Hyperinflation of the lungs. Heart is normal size. No confluent airspace opacities or effusions. Moderate-sized hiatal hernia. No acute bony abnormality.  IMPRESSION: COPD.  No active disease.  Moderate hiatal hernia.   Electronically Signed   By: Charlett Nose M.D.   On: 07/17/2013 08:00   Dg Chest 2 View  07/16/2013   CLINICAL DATA:  Cough, dyspnea, and fever, history of asthma  EXAM: CHEST  2 VIEW  COMPARISON:  PA and lateral chest x-ray of March 24, 2013.  FINDINGS: The lungs are hyperinflated. There is no focal infiltrate. The pulmonary vascularity is not engorged. The cardiopericardial silhouette is normal in size. The mediastinum is normal in width. There is no pleural effusion or pneumothorax. 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  IMPRESSION: There is no evidence of pneumonia nor CHF. Mild hyperinflation is consistent with reactive airway disease or COPD.   Electronically Signed   By: David  [place]   On: 07/16/2013 13:48     CBC  Recent Labs Lab 07/16/13 1240 07/17/13 0640  WBC 7.3 5.9  HGB 15.5 14.1  HCT 44.9 41.8  PLT 185 189  MCV 85.0 84.8  MCH 29.4 28.6  MCHC 34.5 33.7  RDW 15.2 15.3  LYMPHSABS 1.1  --   MONOABS 0.4  --   EOSABS 0.0  --   BASOSABS 0.0  --     Chemistries   Recent Labs Lab 07/16/13 1240 07/16/13 1800 07/17/13 0640  NA 135  --  135  K 3.5  --  4.2  CL 101  --  101  CO2 21  --  20  GLUCOSE 98  --  140*  BUN 8  --  11  CREATININE 0.99  --  0.97  CALCIUM 8.9  --  8.7  MG  --  1.8  --    ------------------------------------------------------------------------------------------------------------------ estimated creatinine clearance is 82.8 ml/min (by C-G formula based on Cr of 0.97). ------------------------------------------------------------------------------------------------------------------ No results found for this basename: HGBA1C,  in the last 72 hours ------------------------------------------------------------------------------------------------------------------ No results found for this basename: CHOL, HDL, LDLCALC, TRIG, CHOLHDL, LDLDIRECT,  in the last 72 hours ------------------------------------------------------------------------------------------------------------------ No results found for this basename: TSH, T4TOTAL, FREET3, T3FREE, THYROIDAB,  in the last 72 hours ------------------------------------------------------------------------------------------------------------------ No results found for this basename: VITAMINB12, FOLATE, FERRITIN, TIBC, IRON, RETICCTPCT,  in the last 72 hours  Coagulation profile No results found for this basename: INR, PROTIME,  in the last 168 hours  No results found for this basename: DDIMER,  in the last 72 hours  Cardiac Enzymes No results found for this basename: CK, CKMB, TROPONINI, MYOGLOBIN,  in the last 168  hours ------------------------------------------------------------------------------------------------------------------ No components found with this basename: POCBNP,

## 2013-07-18 DIAGNOSIS — F17200 Nicotine dependence, unspecified, uncomplicated: Secondary | ICD-10-CM

## 2013-07-18 LAB — CBC
HCT: 41.4 % (ref 39.0–52.0)
Hemoglobin: 14.1 g/dL (ref 13.0–17.0)
MCH: 28.8 pg (ref 26.0–34.0)
MCHC: 34.1 g/dL (ref 30.0–36.0)
MCV: 84.5 fL (ref 78.0–100.0)
Platelets: 165 10*3/uL (ref 150–400)
RBC: 4.9 MIL/uL (ref 4.22–5.81)
RDW: 15.5 % (ref 11.5–15.5)

## 2013-07-18 LAB — BASIC METABOLIC PANEL
BUN: 12 mg/dL (ref 6–23)
CO2: 20 mEq/L (ref 19–32)
Calcium: 8.3 mg/dL — ABNORMAL LOW (ref 8.4–10.5)
Creatinine, Ser: 0.82 mg/dL (ref 0.50–1.35)
Glucose, Bld: 142 mg/dL — ABNORMAL HIGH (ref 70–99)
Sodium: 138 mEq/L (ref 135–145)

## 2013-07-18 LAB — GLUCOSE, CAPILLARY
Glucose-Capillary: 121 mg/dL — ABNORMAL HIGH (ref 70–99)
Glucose-Capillary: 129 mg/dL — ABNORMAL HIGH (ref 70–99)

## 2013-07-18 MED ORDER — IPRATROPIUM BROMIDE 0.02 % IN SOLN: 0.5000 mg | Freq: Four times a day (QID) | RESPIRATORY_TRACT | Status: DC | PRN

## 2013-07-18 MED ORDER — METHYLPREDNISOLONE (PAK) 4 MG PO TABS: ORAL_TABLET | ORAL | Status: DC

## 2013-07-18 MED ORDER — OSELTAMIVIR PHOSPHATE 75 MG PO CAPS: 75.0000 mg | ORAL_CAPSULE | Freq: Two times a day (BID) | ORAL | Status: DC

## 2013-07-18 MED ORDER — ALBUTEROL SULFATE (5 MG/ML) 0.5% IN NEBU: 2.5000 mg | INHALATION_SOLUTION | Freq: Four times a day (QID) | RESPIRATORY_TRACT | Status: DC | PRN

## 2013-07-18 NOTE — Progress Notes (Signed)
Reviewed discharge instructions with patient and he stated his understanding.  Discharged home via wheelchair by volunteers to cab, per patient request.  Joe Garcia

## 2013-07-18 NOTE — Discharge Summary (Signed)
Physician Discharge Summary  [name] UJW:[id number] DOB: [birth year] DOA: 07/16/2013  PCP: No PCP Per Patient  Admit date: 07/16/2013 Discharge date: 07/18/2013  Time spent: 35 minutes  Recommendations for Outpatient Follow-up:  Patient will be discharged home. Patient will need followup with his primary care physician within one week of discharge. She continue taking his medications as prescribed. Patient was instructed to stay indoors, as he is positive for the flu. He was also instructed to inform close contacts with that they should see their primary care physician and should be taking Tamiflu prophylactically.  Discharge Diagnoses:  Principal Problem:   Asthma exacerbation with COPD, likely secondary to influenza virus Active Problems:   HYPERTENSION   SOB (shortness of breath), improved   Fever   Tachycardia   Influenza A   Discharge Condition: Stable  Diet recommendation: Cardiac healthy  Filed Weights   07/16/13 2015 07/17/13 2118  Weight: 72.3 kg (159 lb 6.3 oz) 72.576 kg (160 lb)    History of present illness:  Joe Garcia is a 62 y.o. male with PMH significant for HTN, asthma/COPD and prior history of alcohol abuse and tobacco abuse; came to [facility] complaining of worsening SOB/wheezing, cough, fever and general malaise. Patient reports symptoms present for the last 2 days and worsening; reported no improvement with inhaler at home. Patient endorses pot-tussive vomiting. Denies CP, hematemesis, HA's, melena, abd pain, dysuria, sick contacts, recent use of tobacco, alcohol or any recreational drug. Not flu vaccine this year.  Hospital Course:  This is a 62 year old male with a history of hypertension, COPD, asthma that presented with complaints of shortness of breath, cough fever and general malaise. Patient was admitted for an asthma exacerbation with COPD. Patient was found to be positive for influenza A., and was started on Tamiflu. Patient was instructed to  let close contacts no, and to also be tested and take Tamiflu prophylactically. Patient was placed on Tamiflu as well as nebulizer treatments as well as Solu-Medrol. His shortness of breath did improve. Patient has never had PFT testing and probably should do so once his influenza virus has resolved and his breathing is back to normal. Patient does use nebulizer treatments at home however his mask will need to be replaced. His nebulizer treatments were refilled. Patient was also found to have hypertension however on no home medications. Patient was placed on hydralazine as needed in the hospital. Patient was found to be tachycardic upon admission, however this is likely secondary to his asthma exacerbation along with albuterol med medications. This has subsided. Patient was also found to have posttussive nausea, he was placed on anti-emetics as well as Protonix. This is also subsided. Patient does have a history of alcohol as well as tobacco abuse. He has been given smoking cessation information and counseled. Patient was also found to have hyperglycemia which is likely secondary to his Solu-Medrol. Patient does not have a history of diabetes. Patient again will be discharged home, he should follow up with his primary care physician. Patient was instructed to let close contacts no he has influenza. They should also seek medical attention and received Tamiflu prophylactically. This was discussed with the patient he does understand agree.  Procedures: None  Consultations: None  Discharge Exam: Filed Vitals:   07/18/13 0900  BP:   Pulse: 62  Temp:   Resp: 16   Exam  General: Well developed, well nourished, mild distress, appears stated age  HEENT: NCAT, PERRLA, EOMI, Anicteic Sclera, mucous membranes moist. No  pharyngeal erythema or exudates  Neck: Supple, no JVD, no masses  Cardiovascular: S1 S2 auscultated, no rubs, murmurs or gallops. Regular rate and rhythm.  Respiratory: Clear but diminished  breath sounds, expiratory wheezing improving.  Abdomen: Soft, nontender, nondistended, + bowel sounds  Extremities: warm dry without cyanosis clubbing or edema  Neuro: AAOx3, cranial nerves grossly intact. Strength 5/5 in patient's upper and lower extremities bilaterally  Skin: Without rashes exudates or nodules  Psych: Normal affect and demeanor with intact judgement and insight  Discharge Instructions      Discharge Orders   Future Orders Complete By Expires   Diet - low sodium heart healthy  As directed    Discharge instructions  As directed    Comments:     Patient will be discharged home. Patient will need followup with his primary care physician within one week of discharge. She continue taking his medications as prescribed. Patient was instructed to stay indoors, as he is positive for the flu. He was also instructed to inform close contacts with that they should see their primary care physician and should be taking Tamiflu prophylactically.   Increase activity slowly  As directed        Medication List         albuterol 108 (90 BASE) MCG/ACT inhaler  Commonly known as:  PROVENTIL HFA;VENTOLIN HFA  Inhale 2 puffs into the lungs every 6 (six) hours as needed for wheezing.     albuterol (5 MG/ML) 0.5% nebulizer solution  Commonly known as:  PROVENTIL  Take 0.5 mLs (2.5 mg total) by nebulization every 6 (six) hours as needed for wheezing or shortness of breath.     diphenhydrAMINE 25 mg capsule  Commonly known as:  BENADRYL  Take 1 capsule (25 mg total) by mouth every 6 (six) hours as needed for itching.     ipratropium 0.02 % nebulizer solution  Commonly known as:  ATROVENT  Take 2.5 mLs (0.5 mg total) by nebulization every 6 (six) hours as needed for wheezing or shortness of breath.     methylPREDNIsolone 4 MG tablet  Commonly known as:  MEDROL DOSPACK  follow package directions     oseltamivir 75 MG capsule  Commonly known as:  TAMIFLU  Take 1 capsule (75 mg total)  by mouth 2 (two) times daily.       Allergies  Allergen Reactions  . Penicillins     REACTION: "stomach pains"   Follow-up Information   Follow up with Primary care physician. Schedule an appointment as soon as possible for a visit in 1 week.       The results of significant diagnostics from this hospitalization (including imaging, microbiology, ancillary and laboratory) are listed below for reference.    Significant Diagnostic Studies: Dg Chest 2 View  07/17/2013   CLINICAL DATA:  Shortness of breath, fever.  Question pneumonia.  EXAM: CHEST  2 VIEW  COMPARISON:  07/16/2013  FINDINGS: Hyperinflation of the lungs. Heart is normal size. No confluent airspace opacities or effusions. Moderate-sized hiatal hernia. No acute bony abnormality.  IMPRESSION: COPD.  No active disease.  Moderate hiatal hernia.   Electronically Signed   By: Charlett Nose M.D.   On: 07/17/2013 08:00   Dg Chest 2 View  07/16/2013   CLINICAL DATA:  Cough, dyspnea, and fever, history of asthma  EXAM: CHEST  2 VIEW  COMPARISON:  PA and lateral chest x-ray of March 24, 2013.  FINDINGS: The lungs are hyperinflated. There is no focal  infiltrate. The pulmonary vascularity is not engorged. The cardiopericardial silhouette is normal in size. The mediastinum is normal in width. There is no pleural effusion or pneumothorax. 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  IMPRESSION: There is no evidence of pneumonia nor CHF. Mild hyperinflation is consistent with reactive airway disease or COPD.   Electronically Signed   By: David  [place]   On: 07/16/2013 13:48    Microbiology: No results found for this or any previous visit (from the past 240 hour(s)).   Labs: Basic Metabolic Panel:  Recent Labs Lab 07/16/13 1240 07/16/13 1800 07/17/13 0640 07/18/13 0757  NA 135  --  135 138  K 3.5  --  4.2 3.9  CL 101  --  101 106  CO2 21  --  20 20   GLUCOSE 98  --  140* 142*  BUN 8  --  11 12  CREATININE 0.99  --  0.97 0.82  CALCIUM 8.9  --  8.7 8.3*  MG  --  1.8  --   --    Liver Function Tests: No results found for this basename: AST, ALT, ALKPHOS, BILITOT, PROT, ALBUMIN,  in the last 168 hours No results found for this basename: LIPASE, AMYLASE,  in the last 168 hours No results found for this basename: AMMONIA,  in the last 168 hours CBC:  Recent Labs Lab 07/16/13 1240 07/17/13 0640 07/18/13 0757  WBC 7.3 5.9 3.9*  NEUTROABS 5.8  --   --   HGB 15.5 14.1 14.1  HCT 44.9 41.8 41.4  MCV 85.0 84.8 84.5  PLT 185 189 165   Cardiac Enzymes: No results found for this basename: CKTOTAL, CKMB, CKMBINDEX, TROPONINI,  in the last 168 hours BNP: BNP (last 3 results) No results found for this basename: PROBNP,  in the last 8760 hours CBG:  Recent Labs Lab 07/17/13 0822 07/17/13 1211 07/17/13 1703 07/17/13 2115 07/18/13 0728  GLUCAP 137* 149* 150* 135* 121*       Signed:  Dorr Perrot  [facility] 07/18/2013, 10:15 AM

## 2015-07-12 ENCOUNTER — Emergency Department (HOSPITAL_COMMUNITY)
Admission: EM | Admit: 2015-07-12 | Discharge: 2015-07-12 | Disposition: A | Discharge status: Home / Self Care | Payer: Medicaid Other | Attending: Emergency Medicine | Admitting: Emergency Medicine

## 2015-07-12 ENCOUNTER — Emergency Department (HOSPITAL_COMMUNITY): Payer: Medicaid Other

## 2015-07-12 ENCOUNTER — Encounter (HOSPITAL_COMMUNITY): Payer: Self-pay | Admitting: Emergency Medicine

## 2015-07-12 DIAGNOSIS — Z87891 Personal history of nicotine dependence: Secondary | ICD-10-CM | POA: Insufficient documentation

## 2015-07-12 DIAGNOSIS — J45901 Unspecified asthma with (acute) exacerbation: Secondary | ICD-10-CM | POA: Insufficient documentation

## 2015-07-12 DIAGNOSIS — I10 Essential (primary) hypertension: Secondary | ICD-10-CM | POA: Diagnosis not present

## 2015-07-12 DIAGNOSIS — Z880 Allergy status to penicillin: Secondary | ICD-10-CM | POA: Insufficient documentation

## 2015-07-12 DIAGNOSIS — Z8659 Personal history of other mental and behavioral disorders: Secondary | ICD-10-CM | POA: Diagnosis not present

## 2015-07-12 DIAGNOSIS — R05 Cough: Secondary | ICD-10-CM | POA: Diagnosis present

## 2015-07-12 DIAGNOSIS — M1990 Unspecified osteoarthritis, unspecified site: Secondary | ICD-10-CM | POA: Diagnosis not present

## 2015-07-12 DIAGNOSIS — Z79899 Other long term (current) drug therapy: Secondary | ICD-10-CM | POA: Insufficient documentation

## 2015-07-12 MED ORDER — PREDNISONE 50 MG PO TABS: ORAL_TABLET | ORAL | Status: DC

## 2015-07-12 MED ORDER — ALBUTEROL SULFATE HFA 108 (90 BASE) MCG/ACT IN AERS: 1.0000 | INHALATION_SPRAY | Freq: Four times a day (QID) | RESPIRATORY_TRACT | Status: DC | PRN

## 2015-07-12 MED ORDER — ALBUTEROL SULFATE (2.5 MG/3ML) 0.083% IN NEBU
5.0000 mg | INHALATION_SOLUTION | Freq: Once | RESPIRATORY_TRACT | Status: AC
  Administered 2015-07-12: 5 mg via RESPIRATORY_TRACT

## 2015-07-12 MED ORDER — PREDNISONE 20 MG PO TABS
50.0000 mg | ORAL_TABLET | Freq: Once | ORAL | Status: AC
  Administered 2015-07-12: 50 mg via ORAL
  Filled 2015-07-12: qty 3

## 2015-07-12 MED ORDER — ALBUTEROL SULFATE (2.5 MG/3ML) 0.083% IN NEBU
5.0000 mg | INHALATION_SOLUTION | Freq: Once | RESPIRATORY_TRACT | Status: AC
  Administered 2015-07-12: 5 mg via RESPIRATORY_TRACT
  Filled 2015-07-12: qty 6

## 2015-07-12 MED ORDER — ALBUTEROL SULFATE (2.5 MG/3ML) 0.083% IN NEBU
INHALATION_SOLUTION | RESPIRATORY_TRACT | Status: AC
  Filled 2015-07-12: qty 6

## 2015-07-12 MED ORDER — IPRATROPIUM-ALBUTEROL 0.5-2.5 (3) MG/3ML IN SOLN: 3.0000 mL | RESPIRATORY_TRACT | Status: DC | PRN

## 2015-07-12 NOTE — ED Notes (Signed)
Pt. reports asthma attack onset this morning with productive cough , denies fever or chills.

## 2015-07-12 NOTE — ED Notes (Signed)
Pt states he developed a productive cough last night and has been using his inhaler move often the past several days. Now he's run out of his asthma medications and is SOB.

## 2015-07-12 NOTE — ED Provider Notes (Signed)
CSN: [id number]     Arrival date & time 07/12/15  1844 History   First MD Initiated Contact with Patient 07/12/15 2108     Chief Complaint  Patient presents with  . Asthma  . Cough    HPI  Joe Garcia is a 64 year old male with PMHx of asthma, HTN, alcoholism and arthritis presenting with SOB and cough. He reports getting "a cold" last week which resulted in an asthma exacerbation. He is complaining of productive cough of yellow phlegm currently. He states that he has been having increased SOB and wheezing with talking and exertion. He has had to use his inhaler more frequently and is now out of his albuterol and his nebulizer. He states that he has been out for the past several days. Denies fevers, chills, headaches, dizziness, syncope, weakness, congestion, sore throat, chest pain, abdominal pain, nausea or vomiting.   Past Medical History  Diagnosis Date  . Asthma   . Hypertension   . Alcoholism ([place])   . Family history of anesthesia complication   . Depression   . Arthritis    History reviewed. No pertinent past surgical history. No family history on file. Social History  Substance Use Topics  . Smoking status: Former Research scientist (life sciences)  . Smokeless tobacco: None  . Alcohol Use: Yes     Comment: Pt states no etoh since April 2014    Review of Systems  Constitutional: Negative for fever and chills.  HENT: Negative for congestion, rhinorrhea and sore throat.   Respiratory: Positive for cough, shortness of breath and wheezing. Negative for chest tightness.   Cardiovascular: Negative for chest pain.  Gastrointestinal: Negative for nausea, vomiting and abdominal pain.  Musculoskeletal: Negative for myalgias.  Neurological: Negative for dizziness, syncope, weakness and headaches.  All other systems reviewed and are negative.     Allergies  Penicillins  Home Medications   Prior to Admission medications   Medication Sig Start Date End Date Taking? Authorizing Provider  albuterol  (PROVENTIL HFA;VENTOLIN HFA) 108 (90 BASE) MCG/ACT inhaler Inhale 2 puffs into the lungs every 6 (six) hours as needed for wheezing. 11/01/12  Yes Patrecia Pour, NP  albuterol (PROVENTIL) (5 MG/ML) 0.5% nebulizer solution Take 0.5 mLs (2.5 mg total) by nebulization every 6 (six) hours as needed for wheezing or shortness of breath. 07/18/13  Yes Maryann Mikhail, DO  albuterol (PROVENTIL HFA;VENTOLIN HFA) 108 (90 BASE) MCG/ACT inhaler Inhale 1-2 puffs into the lungs every 6 (six) hours as needed for wheezing or shortness of breath. 07/12/15   Joe Minton, PA-C  ipratropium-albuterol (DUONEB) 0.5-2.5 (3) MG/3ML SOLN Take 3 mLs by nebulization every 2 (two) hours as needed. 07/12/15   Lahoma Crocker Sheralee Qazi, PA-C  predniSONE (DELTASONE) 50 MG tablet Take 1 tablet once a day for 5 days 07/12/15   Zakara Parkey, PA-C   BP 163/83 mmHg  Pulse 69  Temp(Src) 97.9 F (36.6 C) (Oral)  Resp 19  SpO2 98% Physical Exam  Constitutional: He appears well-developed and well-nourished. No distress.  HENT:  Head: Normocephalic and atraumatic.  Right Ear: External ear normal.  Left Ear: External ear normal.  Nose: Nose normal.  Mouth/Throat: Oropharynx is clear and moist.  Eyes: Conjunctivae are normal. Right eye exhibits no discharge. Left eye exhibits no discharge. No scleral icterus.  Neck: Normal range of motion. Neck supple.  Cardiovascular: Normal rate, regular rhythm and normal heart sounds.   Pulmonary/Chest: Effort normal. No accessory muscle usage. No respiratory distress. He has no decreased breath sounds.  He has wheezes. He has no rhonchi. He has no rales.  Unlabored breathing and speaking in full sentences. Few expiratory wheezes throughout lower lung fields. Moving air well through all lung fields without rales or rhonchi.   Abdominal: Soft. There is no tenderness. There is no rebound and no guarding.  Musculoskeletal: Normal range of motion.  Moves all extremities spontaneously  Lymphadenopathy:    He  has no cervical adenopathy.  Neurological: He is alert. Coordination normal.  Skin: Skin is warm and dry.  Psychiatric: He has a normal mood and affect. His behavior is normal.  Nursing note and vitals reviewed.   ED Course  Procedures (including critical care time) Labs Review Labs Reviewed - No data to display  Imaging Review Dg Chest 2 View  07/12/2015  CLINICAL DATA:  Productive cough since last night. Asthma attack this morning. Headache and head congestion. EXAM: CHEST  2 VIEW COMPARISON:  07/17/2013 FINDINGS: Emphysematous changes in the lungs. No focal airspace disease or consolidation. No blunting of costophrenic angles. No pneumothorax. Mediastinal contours appear intact. Normal heart size and pulmonary vascularity. Esophageal hiatal hernia behind the heart. Degenerative changes in the thoracic spine with mid thoracic vertebral compression deformities. No change since prior study. IMPRESSION: Emphysematous changes in the lungs. No evidence of active pulmonary disease. Electronically Signed   By: Lucienne Capers M.D.   On: 07/12/2015 22:37   I have personally reviewed and evaluated these images and lab results as part of my medical decision-making.   EKG Interpretation   Date/Time:  Thursday July 12 2015 19:06:02 EST Ventricular Rate:  104 PR Interval:  126 QRS Duration: 84 QT Interval:  366 QTC Calculation: 481 R Axis:   65 Text Interpretation:  Sinus tachycardia with frequent Premature  ventricular complexes Possible Left atrial enlargement Left ventricular  hypertrophy Abnormal ECG Since previous tracing PVCs are new Confirmed by  Canary Brim  MD, MARTHA [phone]) on 07/12/2015 9:33:33 PM      MDM   Final diagnoses:  Asthma exacerbation   Patient presenting with asthma exacerbation. Treated with duoneb in ED with significant improvement in lung sounds and symptoms. Patient ambulated in ED with O2 saturations maintained >90. There are no current signs of respiratory  distress. Prednisone given in the ED and pt will be discharged with 5 day burst and albuterol and nebulizer solution.  Pt states they are breathing at baseline. Pt has been instructed to follow up with a PCP regarding today's ED visit. Given referral information for [facility]. Return precautions given in discharge paperwork and discussed with pt at bedside. Pt stable for discharge     Josephina Gip, PA-C 07/12/15 2335  Alfonzo Beers, MD 07/12/15 [phone]

## 2015-07-12 NOTE — ED Notes (Signed)
Pt departed under his own power and in NAD.

## 2015-07-12 NOTE — Discharge Instructions (Signed)
Asthma, Adult Asthma is a recurring condition in which the airways tighten and narrow. Asthma can make it difficult to breathe. It can cause coughing, wheezing, and shortness of breath. Asthma episodes, also called asthma attacks, range from minor to life-threatening. Asthma cannot be cured, but medicines and lifestyle changes can help control it. CAUSES Asthma is believed to be caused by inherited (genetic) and environmental factors, but its exact cause is unknown. Asthma may be triggered by allergens, lung infections, or irritants in the air. Asthma triggers are different for each person. Common triggers include:  1. Animal dander. 2. Dust mites. 3. Cockroaches. 4. Pollen from trees or grass. 5. Mold. 6. Smoke. 7. Air pollutants such as dust, household cleaners, hair sprays, aerosol sprays, paint fumes, strong chemicals, or strong odors. 8. Cold air, weather changes, and winds (which increase molds and pollens in the air). 9. Strong emotional expressions such as crying or laughing hard. 10. Stress. 11. Certain medicines (such as aspirin) or types of drugs (such as beta-blockers). 12. Sulfites in foods and drinks. Foods and drinks that may contain sulfites include dried fruit, potato chips, and sparkling grape juice. 13. Infections or inflammatory conditions such as the flu, a cold, or an inflammation of the nasal membranes (rhinitis). 14. Gastroesophageal reflux disease (GERD). 15. Exercise or strenuous activity. SYMPTOMS Symptoms may occur immediately after asthma is triggered or many hours later. Symptoms include: 1. Wheezing. 2. Excessive nighttime or early morning coughing. 3. Frequent or severe coughing with a common cold. 4. Chest tightness. 5. Shortness of breath. DIAGNOSIS  The diagnosis of asthma is made by a review of your medical history and a physical exam. Tests may also be performed. These may include:  Lung function studies. These tests show how much air you breathe in  and out.  Allergy tests.  Imaging tests such as X-rays. TREATMENT  Asthma cannot be cured, but it can usually be controlled. Treatment involves identifying and avoiding your asthma triggers. It also involves medicines. There are 2 classes of medicine used for asthma treatment:   Controller medicines. These prevent asthma symptoms from occurring. They are usually taken every day.  Reliever or rescue medicines. These quickly relieve asthma symptoms. They are used as needed and provide short-term relief. Your health care provider will help you create an asthma action plan. An asthma action plan is a written plan for managing and treating your asthma attacks. It includes a list of your asthma triggers and how they may be avoided. It also includes information on when medicines should be taken and when their dosage should be changed. An action plan may also involve the use of a device called a peak flow meter. A peak flow meter measures how well the lungs are working. It helps you monitor your condition. HOME CARE INSTRUCTIONS   Take medicines only as directed by your health care provider. Speak with your health care provider if you have questions about how or when to take the medicines.  Use a peak flow meter as directed by your health care provider. Record and keep track of readings.  Understand and use the action plan to help minimize or stop an asthma attack without needing to seek medical care.  Control your home environment in the following ways to help prevent asthma attacks:  Do not smoke. Avoid being exposed to secondhand smoke.  Change your heating and air conditioning filter regularly.  Limit your use of fireplaces and wood stoves.  Get rid of pests (such as roaches  and mice) and their droppings.  Throw away plants if you see mold on them.  Clean your floors and dust regularly. Use unscented cleaning products.  Try to have someone else vacuum for you regularly. Stay out of  rooms while they are being vacuumed and for a short while afterward. If you vacuum, use a dust mask from a hardware store, a double-layered or microfilter vacuum cleaner bag, or a vacuum cleaner with a HEPA filter.  Replace carpet with wood, tile, or vinyl flooring. Carpet can trap dander and dust.  Use allergy-proof pillows, mattress covers, and box spring covers.  Wash bed sheets and blankets every week in hot water and dry them in a dryer.  Use blankets that are made of polyester or cotton.  Clean bathrooms and kitchens with bleach. If possible, have someone repaint the walls in these rooms with mold-resistant paint. Keep out of the rooms that are being cleaned and painted.  Wash hands frequently. SEEK MEDICAL CARE IF:   You have wheezing, shortness of breath, or a cough even if taking medicine to prevent attacks.  The colored mucus you cough up (sputum) is thicker than usual.  Your sputum changes from clear or white to yellow, green, gray, or bloody.  You have any problems that may be related to the medicines you are taking (such as a rash, itching, swelling, or trouble breathing).  You are using a reliever medicine more than 2-3 times per week.  Your peak flow is still at 50-79% of your personal best after following your action plan for 1 hour.  You have a fever. SEEK IMMEDIATE MEDICAL CARE IF:   You seem to be getting worse and are unresponsive to treatment during an asthma attack.  You are short of breath even at rest.  You get short of breath when doing very little physical activity.  You have difficulty eating, drinking, or talking due to asthma symptoms.  You develop chest pain.  You develop a fast heartbeat.  You have a bluish color to your lips or fingernails.  You are light-headed, dizzy, or faint.  Your peak flow is less than 50% of your personal best.   This information is not intended to replace advice given to you by your health care provider. Make  sure you discuss any questions you have with your health care provider.   Document Released: 07/14/2005 Document Revised: 04/04/2015 Document Reviewed: 02/10/2013 Elsevier Interactive Patient Education 2016 [employer].  How to Use an Inhaler Proper inhaler technique is very important. Good technique ensures that the medicine reaches the lungs. Poor technique results in depositing the medicine on the tongue and back of the throat rather than in the airways. If you do not use the inhaler with good technique, the medicine will not help you. STEPS TO FOLLOW IF USING AN INHALER WITHOUT AN EXTENSION TUBE 16. Remove the cap from the inhaler. 17. If you are using the inhaler for the first time, you will need to prime it. Shake the inhaler for 5 seconds and release four puffs into the air, away from your face. Ask your health care provider or pharmacist if you have questions about priming your inhaler. 18. Shake the inhaler for 5 seconds before each breath in (inhalation). 19. Position the inhaler so that the top of the canister faces up. 20. Put your index finger on the top of the medicine canister. Your thumb supports the bottom of the inhaler. 21. Open your mouth. 22. Either place the inhaler between  your teeth and place your lips tightly around the mouthpiece, or hold the inhaler 1-2 inches away from your open mouth. If you are unsure of which technique to use, ask your health care provider. 23. Breathe out (exhale) normally and as completely as possible. 24. Press the canister down with your index finger to release the medicine. 25. At the same time as the canister is pressed, inhale deeply and slowly until your lungs are completely filled. This should take 4-6 seconds. Keep your tongue down. 26. Hold the medicine in your lungs for 5-10 seconds (10 seconds is best). This helps the medicine get into the small airways of your lungs. 27. Breathe out slowly, through pursed lips. Whistling is an example  of pursed lips. 28. Wait at least 15-30 seconds between puffs. Continue with the above steps until you have taken the number of puffs your health care provider has ordered. Do not use the inhaler more than your health care provider tells you. 29. Replace the cap on the inhaler. 30. Follow the directions from your health care provider or the inhaler insert for cleaning the inhaler. STEPS TO FOLLOW IF USING AN INHALER WITH AN EXTENSION (SPACER) 6. Remove the cap from the inhaler. 7. If you are using the inhaler for the first time, you will need to prime it. Shake the inhaler for 5 seconds and release four puffs into the air, away from your face. Ask your health care provider or pharmacist if you have questions about priming your inhaler. 8. Shake the inhaler for 5 seconds before each breath in (inhalation). 9. Place the open end of the spacer onto the mouthpiece of the inhaler. 10. Position the inhaler so that the top of the canister faces up and the spacer mouthpiece faces you. 11. Put your index finger on the top of the medicine canister. Your thumb supports the bottom of the inhaler and the spacer. 12. Breathe out (exhale) normally and as completely as possible. 13. Immediately after exhaling, place the spacer between your teeth and into your mouth. Close your lips tightly around the spacer. 14. Press the canister down with your index finger to release the medicine. 15. At the same time as the canister is pressed, inhale deeply and slowly until your lungs are completely filled. This should take 4-6 seconds. Keep your tongue down and out of the way. 16. Hold the medicine in your lungs for 5-10 seconds (10 seconds is best). This helps the medicine get into the small airways of your lungs. Exhale. 17. Repeat inhaling deeply through the spacer mouthpiece. Again hold that breath for up to 10 seconds (10 seconds is best). Exhale slowly. If it is difficult to take this second deep breath through the  spacer, breathe normally several times through the spacer. Remove the spacer from your mouth. 18. Wait at least 15-30 seconds between puffs. Continue with the above steps until you have taken the number of puffs your health care provider has ordered. Do not use the inhaler more than your health care provider tells you. 19. Remove the spacer from the inhaler, and place the cap on the inhaler. 20. Follow the directions from your health care provider or the inhaler insert for cleaning the inhaler and spacer. If you are using different kinds of inhalers, use your quick relief medicine to open the airways 10-15 minutes before using a steroid if instructed to do so by your health care provider. If you are unsure which inhalers to use and the order of  using them, ask your health care provider, nurse, or respiratory therapist. If you are using a steroid inhaler, always rinse your mouth with water after your last puff, then gargle and spit out the water. Do not swallow the water. AVOID:  Inhaling before or after starting the spray of medicine. It takes practice to coordinate your breathing with triggering the spray.  Inhaling through the nose (rather than the mouth) when triggering the spray. HOW TO DETERMINE IF YOUR INHALER IS FULL OR NEARLY EMPTY You cannot know when an inhaler is empty by shaking it. A few inhalers are now being made with dose counters. Ask your health care provider for a prescription that has a dose counter if you feel you need that extra help. If your inhaler does not have a counter, ask your health care provider to help you determine the date you need to refill your inhaler. Write the refill date on a calendar or your inhaler canister. Refill your inhaler 7-10 days before it runs out. Be sure to keep an adequate supply of medicine. This includes making sure it is not expired, and that you have a spare inhaler.  SEEK MEDICAL CARE IF:   Your symptoms are only partially relieved with your  inhaler.  You are having trouble using your inhaler.  You have some increase in phlegm. SEEK IMMEDIATE MEDICAL CARE IF:   You feel little or no relief with your inhalers. You are still wheezing and are feeling shortness of breath or tightness in your chest or both.  You have dizziness, headaches, or a fast heart rate.  You have chills, fever, or night sweats.  You have a noticeable increase in phlegm production, or there is blood in the phlegm. MAKE SURE YOU:   Understand these instructions.  Will watch your condition.  Will get help right away if you are not doing well or get worse.   This information is not intended to replace advice given to you by your health care provider. Make sure you discuss any questions you have with your health care provider.   Document Released: 07/11/2000 Document Revised: 05/04/2013 Document Reviewed: 02/10/2013 Elsevier Interactive Patient Education 2016 [employer].  How to Use a Nebulizer If you have asthma or other breathing problems, you might need to breathe in (inhale) medicine. This can be done with a nebulizer. A nebulizer is a device that turns liquid medicine into a mist that you can inhale.  There are different kinds of nebulizers. Most are small. With some, you breathe in through a mouthpiece. With others, a mask fits over your nose and mouth. Most nebulizers must be connected to a small air compressor. Air is forced through tubing from the compressor to the nebulizer. The forced air changes the liquid into a fine spray. RISKS AND COMPLICATIONS The nebulizer must work properly for it to help your breathing. If the nebulizer does not produce mist, or if foam comes out, this indicates that the nebulizer is not working properly. Sometimes a filter can get clogged, or there might be a problem with the air compressor. Check the instruction booklet that came with your nebulizer. It should tell you how to fix problems or where to call for help.  You should have at least one extra nebulizer at home. That way, you will always have one when you need it.  HOW TO PREPARE BEFORE USING THE NEBULIZER Take these steps before using the nebulizer: 31. Check your medicine. Make sure it has not expired and is  not damaged in any way.  95. Wash your hands with soap and water.  33. Put all the parts of your nebulizer on a sturdy, flat surface. Make sure the tubing connects the compressor and the nebulizer. 34. Measure the liquid medicine according to your health care provider's instructions. Pour it into the nebulizer. 35. Attach the mouthpiece or mask.  36. Test the nebulizer by turning it on to make sure a spray is coming out. Then, turn it off.  HOW TO USE THE NEBULIZER 21. Sit down and focus on staying relaxed.  22. If your nebulizer has a mask, put it over your nose and mouth. If you use a mouthpiece, put it in your mouth. Press your lips firmly around the mouthpiece. 23. Turn on the nebulizer.  24. Breathe out.  25. Some nebulizers have a finger valve. If yours does, cover up the air hole so the air gets to the nebulizer. 26. Once the medicine begins to mist out, take slow, deep breaths. If there is a finger valve, release it at the end of your breath. 27. Continue taking slow, deep breaths until the nebulizer is empty.  Be sure to stop the machine at any point if you start coughing or if the medicine foams or bubbles. HOW TO CLEAN THE NEBULIZER  The nebulizer and all its parts must be kept very clean. Follow the manufacturer's instructions for cleaning. For most nebulizers, you should follow these guidelines:  Wash the nebulizer after each use. Use warm water and soap. Rinse it well. Shake the nebulizer to remove extra water. Put it on a clean towel until it is completely dry. To make sure it is dry, put the nebulizer back together. Turn on the compressor for a few minutes. This will blow air through the nebulizer.   Do not wash the  tubing or the finger valve.   Store the nebulizer in a dust-free place.   Inspect the filter every week. Replace it any time it looks dirty.   Sometimes the nebulizer will need a more complete cleaning. The instruction booklet should say how often you need to do this. SEEK MEDICAL CARE IF:   You continue to have difficulty breathing.   You have trouble using the nebulizer.    This information is not intended to replace advice given to you by your health care provider. Make sure you discuss any questions you have with your health care provider.   Document Released: 07/02/2009 Document Revised: 08/04/2014 Document Reviewed: 01/03/2013 Elsevier Interactive Patient Education [employer].

## 2015-07-13 ENCOUNTER — Telehealth: Payer: Self-pay | Admitting: *Deleted

## 2015-07-13 NOTE — ED Notes (Signed)
Contacted by [place] regarding prescription for Duoneb stating that max dose for this neb is 6 x daily.  Authorized to change dosing to Q4h.

## 2015-10-16 ENCOUNTER — Encounter (HOSPITAL_COMMUNITY): Payer: Self-pay | Admitting: Neurology

## 2015-10-16 ENCOUNTER — Emergency Department (HOSPITAL_COMMUNITY)
Admission: EM | Admit: 2015-10-16 | Discharge: 2015-10-16 | Disposition: A | Discharge status: Home / Self Care | Payer: Medicaid Other | Attending: Emergency Medicine | Admitting: Emergency Medicine

## 2015-10-16 ENCOUNTER — Emergency Department (HOSPITAL_COMMUNITY): Payer: Medicaid Other

## 2015-10-16 DIAGNOSIS — R6889 Other general symptoms and signs: Secondary | ICD-10-CM

## 2015-10-16 DIAGNOSIS — Z880 Allergy status to penicillin: Secondary | ICD-10-CM | POA: Insufficient documentation

## 2015-10-16 DIAGNOSIS — I10 Essential (primary) hypertension: Secondary | ICD-10-CM | POA: Insufficient documentation

## 2015-10-16 DIAGNOSIS — R5383 Other fatigue: Secondary | ICD-10-CM | POA: Insufficient documentation

## 2015-10-16 DIAGNOSIS — R197 Diarrhea, unspecified: Secondary | ICD-10-CM | POA: Diagnosis not present

## 2015-10-16 DIAGNOSIS — R05 Cough: Secondary | ICD-10-CM | POA: Diagnosis present

## 2015-10-16 DIAGNOSIS — Z87891 Personal history of nicotine dependence: Secondary | ICD-10-CM | POA: Diagnosis not present

## 2015-10-16 DIAGNOSIS — J45901 Unspecified asthma with (acute) exacerbation: Secondary | ICD-10-CM | POA: Insufficient documentation

## 2015-10-16 DIAGNOSIS — Z8659 Personal history of other mental and behavioral disorders: Secondary | ICD-10-CM | POA: Diagnosis not present

## 2015-10-16 DIAGNOSIS — J9801 Acute bronchospasm: Secondary | ICD-10-CM

## 2015-10-16 LAB — CBC WITH DIFFERENTIAL/PLATELET
BASOS PCT: 0 %
Basophils Absolute: 0 10*3/uL (ref 0.0–0.1)
EOS PCT: 1 %
Eosinophils Absolute: 0 10*3/uL (ref 0.0–0.7)
HCT: 44.6 % (ref 39.0–52.0)
HEMOGLOBIN: 15 g/dL (ref 13.0–17.0)
Lymphocytes Relative: 33 %
Lymphs Abs: 1.4 10*3/uL (ref 0.7–4.0)
MCH: 28.3 pg (ref 26.0–34.0)
MCHC: 33.6 g/dL (ref 30.0–36.0)
MCV: 84.2 fL (ref 78.0–100.0)
Monocytes Absolute: 0.3 10*3/uL (ref 0.1–1.0)
Monocytes Relative: 6 %
NEUTROS PCT: 60 %
Neutro Abs: 2.6 10*3/uL (ref 1.7–7.7)
PLATELETS: 178 10*3/uL (ref 150–400)
RBC: 5.3 MIL/uL (ref 4.22–5.81)
RDW: 14.3 % (ref 11.5–15.5)
WBC: 4.3 10*3/uL (ref 4.0–10.5)

## 2015-10-16 LAB — BASIC METABOLIC PANEL
Anion gap: 12 (ref 5–15)
BUN: 17 mg/dL (ref 6–20)
CHLORIDE: 105 mmol/L (ref 101–111)
CO2: 23 mmol/L (ref 22–32)
CREATININE: 1.56 mg/dL — AB (ref 0.61–1.24)
Calcium: 9.1 mg/dL (ref 8.9–10.3)
GFR calc Af Amer: 53 mL/min — ABNORMAL LOW (ref >60)
GFR, EST NON AFRICAN AMERICAN: 46 mL/min — AB (ref >60)
Glucose, Bld: 113 mg/dL — ABNORMAL HIGH (ref 65–99)
POTASSIUM: 3.3 mmol/L — AB (ref 3.5–5.1)
SODIUM: 140 mmol/L (ref 135–145)

## 2015-10-16 MED ORDER — SODIUM CHLORIDE 0.9 % IV BOLUS (SEPSIS)
1000.0000 mL | Freq: Once | INTRAVENOUS | Status: AC
  Administered 2015-10-16: 1000 mL via INTRAVENOUS

## 2015-10-16 MED ORDER — ALBUTEROL SULFATE HFA 108 (90 BASE) MCG/ACT IN AERS
2.0000 | INHALATION_SPRAY | Freq: Once | RESPIRATORY_TRACT | Status: AC
  Administered 2015-10-16: 2 via RESPIRATORY_TRACT
  Filled 2015-10-16: qty 6.7

## 2015-10-16 MED ORDER — PREDNISONE 20 MG PO TABS
60.0000 mg | ORAL_TABLET | Freq: Once | ORAL | Status: AC
  Administered 2015-10-16: 60 mg via ORAL
  Filled 2015-10-16: qty 3

## 2015-10-16 MED ORDER — ALBUTEROL SULFATE (2.5 MG/3ML) 0.083% IN NEBU: 2.5000 mg | INHALATION_SOLUTION | Freq: Four times a day (QID) | RESPIRATORY_TRACT | Status: DC | PRN

## 2015-10-16 MED ORDER — IBUPROFEN 600 MG PO TABS: 600.0000 mg | ORAL_TABLET | Freq: Four times a day (QID) | ORAL | Status: DC | PRN

## 2015-10-16 MED ORDER — ACETAMINOPHEN 325 MG PO TABS
650.0000 mg | ORAL_TABLET | Freq: Once | ORAL | Status: AC
  Administered 2015-10-16: 650 mg via ORAL
  Filled 2015-10-16: qty 2

## 2015-10-16 MED ORDER — PREDNISONE 20 MG PO TABS: 40.0000 mg | ORAL_TABLET | Freq: Every day | ORAL | Status: DC

## 2015-10-16 MED ORDER — IPRATROPIUM-ALBUTEROL 0.5-2.5 (3) MG/3ML IN SOLN
3.0000 mL | Freq: Once | RESPIRATORY_TRACT | Status: AC
  Administered 2015-10-16: 3 mL via RESPIRATORY_TRACT
  Filled 2015-10-16: qty 3

## 2015-10-16 MED ORDER — HYDROCOD POLST-CPM POLST ER 10-8 MG/5ML PO SUER
5.0000 mL | Freq: Once | ORAL | Status: AC
  Administered 2015-10-16: 5 mL via ORAL
  Filled 2015-10-16: qty 5

## 2015-10-16 NOTE — ED Notes (Signed)
Pt reports for several days cough, chills, cough is non-productive. Runny nose, with yellow drainage. Pt is a x 4. In NAD

## 2015-10-16 NOTE — ED Provider Notes (Signed)
CSN: [id number]     Arrival date & time 10/16/15  1018 History   First MD Initiated Contact with Patient 10/16/15 1436     Chief Complaint  Patient presents with  . Cough  . Chills     (Consider location/radiation/quality/duration/timing/severity/associated sxs/prior Treatment) HPI Joe Garcia is a 65 y.o. male with hx of asthma, htn, alcohol abuse, depression, presents to ED with complaint of cough, congestion, sore throat, fever, chills. Pt states symptoms began 3 days ago. Reports persistent cough, generalized weakness and malaise. States he has not eaten anything in 3 days because of no appetite. Pt denies nausea or vomiting. Denies neck pain or stiffness. Reports some diarrhea. No abdominal pain. Has been taking tylenol and mucinex for his symptoms.   Past Medical History  Diagnosis Date  . Asthma   . Hypertension   . Alcoholism ([place])   . Family history of anesthesia complication   . Depression   . Arthritis    History reviewed. No pertinent past surgical history. No family history on file. Social History  Substance Use Topics  . Smoking status: Former Research scientist (life sciences)  . Smokeless tobacco: None  . Alcohol Use: Yes     Comment: Pt states no etoh since April 2014    Review of Systems  Constitutional: Positive for fever, chills, appetite change and fatigue.  HENT: Positive for congestion and sore throat. Negative for trouble swallowing and voice change.   Respiratory: Positive for cough and wheezing. Negative for chest tightness and shortness of breath.   Cardiovascular: Negative for chest pain, palpitations and leg swelling.  Gastrointestinal: Positive for diarrhea. Negative for nausea, vomiting, abdominal pain and abdominal distention.  Genitourinary: Negative for dysuria, urgency, frequency and hematuria.  Musculoskeletal: Negative for myalgias, arthralgias, neck pain and neck stiffness.  Skin: Negative for rash.  Allergic/Immunologic: Negative for immunocompromised state.   Neurological: Positive for headaches. Negative for dizziness, weakness, light-headedness and numbness.  All other systems reviewed and are negative.     Allergies  Penicillins  Home Medications   Prior to Admission medications   Medication Sig Start Date End Date Taking? Authorizing Provider  albuterol (PROVENTIL HFA;VENTOLIN HFA) 108 (90 BASE) MCG/ACT inhaler Inhale 2 puffs into the lungs every 6 (six) hours as needed for wheezing. 11/01/12   Patrecia Pour, NP  albuterol (PROVENTIL HFA;VENTOLIN HFA) 108 (90 BASE) MCG/ACT inhaler Inhale 1-2 puffs into the lungs every 6 (six) hours as needed for wheezing or shortness of breath. 07/12/15   Stevi Barrett, PA-C  albuterol (PROVENTIL) (5 MG/ML) 0.5% nebulizer solution Take 0.5 mLs (2.5 mg total) by nebulization every 6 (six) hours as needed for wheezing or shortness of breath. 07/18/13   Maryann Mikhail, DO  ipratropium-albuterol (DUONEB) 0.5-2.5 (3) MG/3ML SOLN Take 3 mLs by nebulization every 2 (two) hours as needed. 07/12/15   Lahoma Crocker Barrett, PA-C  predniSONE (DELTASONE) 50 MG tablet Take 1 tablet once a day for 5 days 07/12/15   Stevi Barrett, PA-C   BP 135/83 mmHg  Pulse 90  Temp(Src) 99.9 F (37.7 C) (Oral)  Resp 16  SpO2 99% Physical Exam  Constitutional: He is oriented to person, place, and time. He appears well-developed and well-nourished. No distress.  HENT:  Head: Normocephalic and atraumatic.  Right Ear: Tympanic membrane, external ear and ear canal normal.  Left Ear: Tympanic membrane, external ear and ear canal normal.  Nose: Mucosal edema and rhinorrhea present.  Mouth/Throat: Uvula is midline and mucous membranes are normal. Posterior oropharyngeal erythema present. No  oropharyngeal exudate, posterior oropharyngeal edema or tonsillar abscesses.  Eyes: Conjunctivae are normal.  Neck: Neck supple.  Cardiovascular: Normal rate, regular rhythm and normal heart sounds.   Pulmonary/Chest: Effort normal. No respiratory  distress. He has no wheezes. He has no rales.  Abdominal: Soft. Bowel sounds are normal. He exhibits no distension. There is no tenderness. There is no rebound.  Musculoskeletal: He exhibits no edema.  Neurological: He is alert and oriented to person, place, and time.  Skin: Skin is warm and dry.  Nursing note and vitals reviewed.   ED Course  Procedures (including critical care time) Labs Review Labs Reviewed  BASIC METABOLIC PANEL - Abnormal; Notable for the following:    Potassium 3.3 (*)    Glucose, Bld 113 (*)    Creatinine, Ser 1.56 (*)    GFR calc non Af Amer 46 (*)    GFR calc Af Amer 53 (*)    All other components within normal limits  CBC WITH DIFFERENTIAL/PLATELET    Imaging Review Dg Chest 2 View  10/16/2015  CLINICAL DATA:  Cough and fever for 2 days, initial encounter EXAM: CHEST  2 VIEW COMPARISON:  07/12/2015 FINDINGS: Cardiac shadow is within normal limits. The lungs are well aerated bilaterally. No focal infiltrate or sizable effusion is seen. Osteophytes are noted within the thoracic spine. IMPRESSION: No active cardiopulmonary disease. Electronically Signed   By: Inez Catalina M.D.   On: 10/16/2015 12:48   I have personally reviewed and evaluated these images and lab results as part of my medical decision-making.   EKG Interpretation None      MDM   Final diagnoses:  Flu-like symptoms  Bronchospasm   Pt with cough and congestion, fever, chills, body aches. Symptoms consistent with viral infection possible influenza. Temp 99.9 here. Will treat with iv fluds, tylenol, duoneb, prednisone.    Pt's Creat here is 1.56, which is higher than his baseline. Will start IV fluids. duoneb and cough medication will be ordered. Will recheck.   5:26 PM Patient with flulike symptoms. Vital signs are normal. He is feeling much better after hydration. Home with inhaler, prednisone, ibuprofen Tylenol for body aches and fever. Follow with primary care doctor. Return  precautions discussed.  Filed Vitals:   10/16/15 1615 10/16/15 1630 10/16/15 1645 10/16/15 1700  BP: 130/72 129/66 125/57 129/67  Pulse: 80 78 78 76  Temp:      TempSrc:      Resp: 16   16  SpO2: 99% 100% 99% 100%     Jeannett Senior, PA-C 10/16/15 1727  Charlesetta Shanks, MD 10/17/15 2108

## 2015-10-16 NOTE — Discharge Instructions (Signed)
Take ibuprofen as  prescribed for body aches and fever. Take prednisone as prescribed until all gone for lung inflammation. You can use over-the-counter cough medications. Drink plenty of fluids. Use inhaler or breathing treatment every 4 hours. Follow-up with primary care doctor.  Influenza, Adult Influenza ("the flu") is a viral infection of the respiratory tract. It occurs more often in winter months because people spend more time in close contact with one another. Influenza can make you feel very sick. Influenza easily spreads from person to person (contagious). CAUSES  Influenza is caused by a virus that infects the respiratory tract. You can catch the virus by breathing in droplets from an infected person's cough or sneeze. You can also catch the virus by touching something that was recently contaminated with the virus and then touching your mouth, nose, or eyes. RISKS AND COMPLICATIONS You may be at risk for a more severe case of influenza if you smoke cigarettes, have diabetes, have chronic heart disease (such as heart failure) or lung disease (such as asthma), or if you have a weakened immune system. Elderly people and pregnant women are also at risk for more serious infections. The most common problem of influenza is a lung infection (pneumonia). Sometimes, this problem can require emergency medical care and may be life threatening. SIGNS AND SYMPTOMS  Symptoms typically last 4 to 10 days and may include:  Fever.  Chills.  Headache, body aches, and muscle aches.  Sore throat.  Chest discomfort and cough.  Poor appetite.  Weakness or feeling tired.  Dizziness.  Nausea or vomiting. DIAGNOSIS  Diagnosis of influenza is often made based on your history and a physical exam. A nose or throat swab test can be done to confirm the diagnosis. TREATMENT  In mild cases, influenza goes away on its own. Treatment is directed at relieving symptoms. For more severe cases, your health care  provider may prescribe antiviral medicines to shorten the sickness. Antibiotic medicines are not effective because the infection is caused by a virus, not by bacteria. HOME CARE INSTRUCTIONS  Take medicines only as directed by your health care provider.  Use a cool mist humidifier to make breathing easier.  Get plenty of rest until your temperature returns to normal. This usually takes 3 to 4 days.  Drink enough fluid to keep your urine clear or pale yellow.  Cover yourmouth and nosewhen coughing or sneezing,and wash your handswellto prevent thevirusfrom spreading.  Stay homefromwork orschool untilthe fever is gonefor at least 25full day. PREVENTION  An annual influenza vaccination (flu shot) is the best way to avoid getting influenza. An annual flu shot is now routinely recommended for all adults in the [place] IF:  You experiencechest pain, yourcough worsens,or you producemore mucus.  Youhave nausea,vomiting, ordiarrhea.  Your fever returns or gets worse. SEEK IMMEDIATE MEDICAL CARE IF:  You havetrouble breathing, you become short of breath,or your skin ornails becomebluish.  You have severe painor stiffnessin the neck.  You develop a sudden headache, or pain in the face or ear.  You have nausea or vomiting that you cannot control. MAKE SURE YOU:   Understand these instructions.  Will watch your condition.  Will get help right away if you are not doing well or get worse.   This information is not intended to replace advice given to you by your health care provider. Make sure you discuss any questions you have with your health care provider.   Document Released: 07/11/2000 Document Revised: 08/04/2014 Document  Reviewed: 10/13/2011 Elsevier Interactive Patient Education [employer].

## 2016-05-18 ENCOUNTER — Ambulatory Visit (HOSPITAL_COMMUNITY)
Admission: EM | Admit: 2016-05-18 | Discharge: 2016-05-18 | Disposition: A | Discharge status: Home / Self Care | Payer: Medicaid Other | Attending: Family Medicine | Admitting: Family Medicine

## 2016-05-18 ENCOUNTER — Encounter (HOSPITAL_COMMUNITY): Payer: Self-pay | Admitting: Emergency Medicine

## 2016-05-18 ENCOUNTER — Ambulatory Visit (INDEPENDENT_AMBULATORY_CARE_PROVIDER_SITE_OTHER): Payer: Medicaid Other

## 2016-05-18 DIAGNOSIS — I499 Cardiac arrhythmia, unspecified: Secondary | ICD-10-CM | POA: Diagnosis not present

## 2016-05-18 DIAGNOSIS — S60221A Contusion of right hand, initial encounter: Secondary | ICD-10-CM | POA: Diagnosis not present

## 2016-05-18 MED ORDER — NAPROXEN 500 MG PO TABS: 500.0000 mg | ORAL_TABLET | Freq: Two times a day (BID) | ORAL | 0 refills | Status: DC | PRN

## 2016-05-18 NOTE — ED Provider Notes (Signed)
CSN: [id number]     Arrival date & time 05/18/16  1301 History   First MD Initiated Contact with Patient 05/18/16 1533     Chief Complaint  Patient presents with  . Hand Pain   (Consider location/radiation/quality/duration/timing/severity/associated sxs/prior Treatment) 65 year old male presents with injury to his right hand that occurred around 11am today. He was playing with some kids in his neighborhood and they hit his right hand with a stick just below the thumb. He is now having swelling and slight bruising to the area and very tender. Unable to make a fist. Has full range of motion of wrist and arm. Has not applied any ice or taken any medication for pain or swelling. While triaging him for his hand, the nurse discovered his irregular pulse rate. He denies any dizziness, shortness of breath, chest pain. He does have a history of hypertension and has not taken any medication in many months. He also has a history of asthma and uses Albuterol nebulizer as needed- requests refill of nebules.    The history is provided by the patient.    Past Medical History:  Diagnosis Date  . Alcoholism ([place])   . Arthritis   . Asthma   . Depression   . Family history of anesthesia complication   . Hypertension    History reviewed. No pertinent surgical history. No family history on file. Social History  Substance Use Topics  . Smoking status: Former Research scientist (life sciences)  . Smokeless tobacco: Not on file  . Alcohol use Yes     Comment: Pt states no etoh since April 2014    Review of Systems  Constitutional: Negative for appetite change, chills, diaphoresis, fatigue and fever.  Respiratory: Negative for cough, chest tightness, shortness of breath and wheezing.   Cardiovascular: Negative for chest pain, palpitations and leg swelling.  Gastrointestinal: Negative for diarrhea, nausea and vomiting.  Musculoskeletal: Positive for arthralgias and joint swelling.  Skin: Positive for color change. Negative for  rash.  Neurological: Negative for dizziness, syncope, weakness, light-headedness, numbness and headaches.    Allergies  Penicillins  Home Medications   Prior to Admission medications   Medication Sig Start Date End Date Taking? Authorizing Provider  albuterol (PROVENTIL HFA;VENTOLIN HFA) 108 (90 BASE) MCG/ACT inhaler Inhale 1-2 puffs into the lungs every 6 (six) hours as needed for wheezing or shortness of breath. 07/12/15   Stevi Barrett, PA-C  albuterol (PROVENTIL) (2.5 MG/3ML) 0.083% nebulizer solution Take 3 mLs (2.5 mg total) by nebulization every 6 (six) hours as needed for wheezing or shortness of breath. 10/16/15   Tatyana Kirichenko, PA-C  ipratropium-albuterol (DUONEB) 0.5-2.5 (3) MG/3ML SOLN Take 3 mLs by nebulization every 2 (two) hours as needed. 07/12/15   Stevi Barrett, PA-C  naproxen (NAPROSYN) 500 MG tablet Take 1 tablet (500 mg total) by mouth 2 (two) times daily as needed for moderate pain. 05/18/16   Katy Apo, NP   Meds Ordered and Administered this Visit  Medications - No data to display  BP 172/61 (BP Location: Right Arm)   Pulse 70 Comment: 40's-70's  Temp 98.1 F (36.7 C) (Oral)   Resp 18   SpO2 100%  No data found.   Physical Exam  Constitutional: He is oriented to person, place, and time. He appears well-developed and well-nourished. No distress.  HENT:  Head: Normocephalic and atraumatic.  Mouth/Throat: Oropharynx is clear and moist.  Neck: Normal range of motion. Neck supple.  Cardiovascular: An irregularly irregular rhythm present. Frequent extrasystoles are  present.  No murmur heard. Pulmonary/Chest: Effort normal and breath sounds normal. No respiratory distress. He has no wheezes. He has no rales.  Musculoskeletal: He exhibits edema and tenderness.       Right hand: He exhibits decreased range of motion, tenderness and swelling. He exhibits normal capillary refill, no deformity and no laceration. Normal sensation noted. Decreased strength  noted. He exhibits thumb/finger opposition.       Hands: Contusion on right palm near base of thumb. Swelling and tenderness extend from base of wrist to base of 2nd finger. Unable to make a fist. Limited movement of thumb due to pain. Good pulses and normal capillary refill. No neuro deficits noted.   Neurological: He is alert and oriented to person, place, and time. He has normal strength.  Skin: Skin is warm and dry. Capillary refill takes less than 2 seconds.  Psychiatric: He has a normal mood and affect. His behavior is normal. Judgment and thought content normal.    Urgent Care Course   Clinical Course    ED EKG Date/Time: 05/18/2016 4:03 PM Performed by: Melvyn Neth, Zameer Borman BERRY Authorized by: Zoe Lan BERRY   ECG reviewed by ED Physician in the absence of a cardiologist: yes   Previous ECG:    Previous ECG:  Compared to current   Comparison ECG info:  Similar wave pattern and results from 07/12/2015 Interpretation:    Interpretation: abnormal   Rate:    ECG rate:  70   ECG rate assessment comment:  Variable Rhythm:    Rhythm: sinus rhythm   Ectopy:    Ectopy: PVCs     PVCs:  Frequent ST segments:    ST segments:  Abnormal Other findings:    Other findings: LVH   Comments:     Dr. Juventino Slovak reviewed EKG results- similar to EKG done on 07-12-2015. T waves were also abnormal in 2016 but not documented on EKG tracing. Patient is asymptomatic- he needs to follow-up with his PCP or Cardiologist for further evaluation.    (including critical care time)  Labs Review Labs Reviewed - No data to display  Imaging Review Dg Hand Complete Right  Result Date: 05/18/2016 CLINICAL DATA:  Hand injury EXAM: RIGHT HAND - COMPLETE 3+ VIEW COMPARISON:  None. FINDINGS: Three views of the right hand submitted. No acute fracture or subluxation. Degenerative changes distal interphalangeal joints first and second finger. Mild narrowing of radiocarpal joint space. IMPRESSION: No acute fracture or  subluxation. Degenerative changes as described above. Electronically Signed   By: Lahoma Crocker M.D.   On: 05/18/2016 16:22     Visual Acuity Review  Right Eye Distance:   Left Eye Distance:   Bilateral Distance:    Right Eye Near:   Left Eye Near:    Bilateral Near:         MDM   1. Contusion of right hand, initial encounter   2. Irregular heart rate    Discussed negative x-ray results of hand with patient. Recommend apply ice to area. Applied ace wrap to hand. Take Naproxen twice a day as needed for pain and swelling. Discussed that he has an irregular heart rate that needs further evaluation. Since he is asymptomatic, he may wait until tomorrow to contact a Cardiologist (information provided). If any chest pain, shortness of breath or dizziness occurs, he needs to go to ER ASAP. Reviewed that Albuterol can affect heart rate and since he is not having any wheezing or shortness of breath, will not refill  his Albuterol until he sees a Film/video editor. Patient understands and will follow-up as planned.     Katy Apo, NP 05/19/16 [phone]

## 2016-05-18 NOTE — ED Notes (Signed)
Patient transported to X-ray 

## 2016-05-18 NOTE — Discharge Instructions (Signed)
Recommend apply ice to hand for comfort. Start Naproxen twice a day as needed for pain and swelling. Recommend follow-up with a Cardiologist for further evaluation of irregular heart rate as soon as possible. Call tomorrow for an appointment.

## 2016-05-18 NOTE — ED Triage Notes (Signed)
Reports playing with some kids and reports right hand was hit with a stick.  Pain and swelling to right hand.    Patient's heart rate varies from 40s to 70's.  Patient denies any heart complaints  Notified Ann, NP.

## 2016-05-18 NOTE — ED Notes (Signed)
ekg given to Lelon Frohlich, NP.  Patient delayed from xray until providers agreed to send patient to xray

## 2016-05-18 NOTE — ED Notes (Signed)
Provided fresh ice packs

## 2016-05-18 NOTE — ED Triage Notes (Signed)
Patient has also ran out of albuterol medicines

## 2016-05-27 ENCOUNTER — Ambulatory Visit: Payer: Self-pay | Admitting: Physician Assistant

## 2016-07-30 ENCOUNTER — Emergency Department (HOSPITAL_COMMUNITY): Payer: Medicaid Other

## 2016-07-30 ENCOUNTER — Emergency Department (HOSPITAL_COMMUNITY)
Admission: EM | Admit: 2016-07-30 | Discharge: 2016-07-30 | Disposition: A | Discharge status: Home / Self Care | Payer: Medicaid Other | Attending: Emergency Medicine | Admitting: Emergency Medicine

## 2016-07-30 ENCOUNTER — Encounter (HOSPITAL_COMMUNITY): Payer: Self-pay | Admitting: Adult Health

## 2016-07-30 DIAGNOSIS — Z87891 Personal history of nicotine dependence: Secondary | ICD-10-CM | POA: Insufficient documentation

## 2016-07-30 DIAGNOSIS — J441 Chronic obstructive pulmonary disease with (acute) exacerbation: Secondary | ICD-10-CM

## 2016-07-30 DIAGNOSIS — R0602 Shortness of breath: Secondary | ICD-10-CM | POA: Diagnosis present

## 2016-07-30 DIAGNOSIS — I10 Essential (primary) hypertension: Secondary | ICD-10-CM | POA: Insufficient documentation

## 2016-07-30 DIAGNOSIS — J4531 Mild persistent asthma with (acute) exacerbation: Secondary | ICD-10-CM | POA: Diagnosis not present

## 2016-07-30 LAB — I-STAT TROPONIN, ED: TROPONIN I, POC: 0 ng/mL (ref 0.00–0.08)

## 2016-07-30 LAB — BASIC METABOLIC PANEL
ANION GAP: 9 (ref 5–15)
BUN: 9 mg/dL (ref 6–20)
CALCIUM: 9.2 mg/dL (ref 8.9–10.3)
CO2: 24 mmol/L (ref 22–32)
CREATININE: 0.98 mg/dL (ref 0.61–1.24)
Chloride: 106 mmol/L (ref 101–111)
GFR calc Af Amer: >60 mL/min (ref >60)
Glucose, Bld: 108 mg/dL — ABNORMAL HIGH (ref 65–99)
Potassium: 3.6 mmol/L (ref 3.5–5.1)
Sodium: 139 mmol/L (ref 135–145)

## 2016-07-30 LAB — CBC
HCT: 40.9 % (ref 39.0–52.0)
Hemoglobin: 13.7 g/dL (ref 13.0–17.0)
MCH: 28.1 pg (ref 26.0–34.0)
MCHC: 33.5 g/dL (ref 30.0–36.0)
MCV: 83.8 fL (ref 78.0–100.0)
PLATELETS: 226 10*3/uL (ref 150–400)
RBC: 4.88 MIL/uL (ref 4.22–5.81)
RDW: 14.7 % (ref 11.5–15.5)
WBC: 6 10*3/uL (ref 4.0–10.5)

## 2016-07-30 MED ORDER — ALBUTEROL SULFATE HFA 108 (90 BASE) MCG/ACT IN AERS
2.0000 | INHALATION_SPRAY | Freq: Once | RESPIRATORY_TRACT | Status: AC
  Administered 2016-07-30: 2 via RESPIRATORY_TRACT
  Filled 2016-07-30: qty 6.7

## 2016-07-30 MED ORDER — DOXYCYCLINE HYCLATE 100 MG PO TABS
100.0000 mg | ORAL_TABLET | Freq: Once | ORAL | Status: AC
  Administered 2016-07-30: 100 mg via ORAL
  Filled 2016-07-30: qty 1

## 2016-07-30 MED ORDER — ALBUTEROL SULFATE HFA 108 (90 BASE) MCG/ACT IN AERS: 2.0000 | INHALATION_SPRAY | RESPIRATORY_TRACT | 0 refills | Status: DC | PRN

## 2016-07-30 MED ORDER — IPRATROPIUM-ALBUTEROL 0.5-2.5 (3) MG/3ML IN SOLN
3.0000 mL | Freq: Once | RESPIRATORY_TRACT | Status: AC
  Administered 2016-07-30: 3 mL via RESPIRATORY_TRACT

## 2016-07-30 MED ORDER — DOXYCYCLINE HYCLATE 100 MG PO CAPS: 100.0000 mg | ORAL_CAPSULE | Freq: Two times a day (BID) | ORAL | 0 refills | Status: DC

## 2016-07-30 MED ORDER — NAPROXEN 250 MG PO TABS
500.0000 mg | ORAL_TABLET | Freq: Once | ORAL | Status: AC
  Administered 2016-07-30: 500 mg via ORAL
  Filled 2016-07-30: qty 2

## 2016-07-30 MED ORDER — ALBUTEROL SULFATE (2.5 MG/3ML) 0.083% IN NEBU: 2.5000 mg | INHALATION_SOLUTION | RESPIRATORY_TRACT | 12 refills | Status: DC | PRN

## 2016-07-30 MED ORDER — PREDNISONE 10 MG PO TABS: 50.0000 mg | ORAL_TABLET | Freq: Every day | ORAL | 0 refills | Status: AC

## 2016-07-30 MED ORDER — PREDNISONE 20 MG PO TABS
60.0000 mg | ORAL_TABLET | Freq: Once | ORAL | Status: AC
  Administered 2016-07-30: 60 mg via ORAL
  Filled 2016-07-30: qty 3

## 2016-07-30 MED ORDER — IPRATROPIUM-ALBUTEROL 0.5-2.5 (3) MG/3ML IN SOLN
3.0000 mL | RESPIRATORY_TRACT | Status: AC
  Administered 2016-07-30 (×3): 3 mL via RESPIRATORY_TRACT
  Filled 2016-07-30: qty 6
  Filled 2016-07-30: qty 3

## 2016-07-30 MED ORDER — IPRATROPIUM-ALBUTEROL 0.5-2.5 (3) MG/3ML IN SOLN
RESPIRATORY_TRACT | Status: AC
  Filled 2016-07-30: qty 3

## 2016-07-30 NOTE — Discharge Instructions (Signed)
Please take steroids and antibiotics as prescribed. Give yourself a breathing treatment every 4 hours for the first day scheduled. Then take albuterol as needed.  Return without fail for worsening symptoms, including difficulty breathing, passing out, severe chest pain or any other symptoms concerning to you.

## 2016-07-30 NOTE — ED Provider Notes (Signed)
[facility] DEPT Provider Note   CSN: [id number] Arrival date & time: 07/30/16  [id number]     History   Chief Complaint Chief Complaint  Patient presents with  . Shortness of Breath    HPI Joe Garcia is a 66 y.o. male.  HPI  66 year old male who presents with shortness Of breath and cough. He has a history of asthma. States onset of cough productive of yellow sputum over the past 3 days. Has had wheezing and shortness of breath starting yesterday, and used his rescue inhaler initially without significant relief. His albuterol inhaler subsequently ran out, and does not have any further refills. Has not had fever or chills. Has had occasional vomiting but no diarrhea or abdominal pain. States that with coughing does have chest wall soreness. No leg pain or leg swelling. No known sick contacts.   Past Medical History:  Diagnosis Date  . Alcoholism ([place])   . Arthritis   . Asthma   . Depression   . Family history of anesthesia complication   . Hypertension     Patient Active Problem List   Diagnosis Date Noted  . Influenza A 07/17/2013  . Influenza 07/16/2013  . Asthma exacerbation in COPD ([place]) 07/16/2013  . SOB (shortness of breath) 07/16/2013  . Fever 07/16/2013  . Tachycardia 07/16/2013  . Alcohol abuse 09/25/2012  . Alcohol dependence ([place]) 09/22/2012  . HYPERTENSION 03/22/2007  . DEGENERATIVE DISC DISEASE 03/22/2007  . 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 No pertinent surgical history.     Home Medications    Prior to Admission medications   Medication Sig Start Date End Date Taking? Authorizing Provider  acetaminophen (TYLENOL) 500 MG tablet Take 1,000 mg by mouth every 6 (six) hours as needed for mild pain.   Yes Historical Provider, MD  albuterol (PROVENTIL HFA;VENTOLIN HFA) 108 (90 BASE) MCG/ACT inhaler Inhale 1-2 puffs into the lungs every 6 (six) hours as needed for wheezing or shortness of breath. 07/12/15  Yes Stevi Barrett, PA-C    albuterol (PROVENTIL) (2.5 MG/3ML) 0.083% nebulizer solution Take 3 mLs (2.5 mg total) by nebulization every 6 (six) hours as needed for wheezing or shortness of breath. 10/16/15  Yes Tatyana Kirichenko, PA-C  ipratropium-albuterol (DUONEB) 0.5-2.5 (3) MG/3ML SOLN Take 3 mLs by nebulization every 2 (two) hours as needed (shortness of breath).    Yes Historical Provider, MD  naproxen (NAPROSYN) 500 MG tablet Take 1 tablet (500 mg total) by mouth 2 (two) times daily as needed for moderate pain. 05/18/16  Yes Katy Apo, NP  albuterol (PROVENTIL HFA;VENTOLIN HFA) 108 (90 Base) MCG/ACT inhaler Inhale 2 puffs into the lungs every 4 (four) hours as needed for wheezing or shortness of breath. 07/30/16   Forde Dandy, MD  albuterol (PROVENTIL) (2.5 MG/3ML) 0.083% nebulizer solution Take 3 mLs (2.5 mg total) by nebulization every 4 (four) hours as needed for wheezing or shortness of breath. 07/30/16   Forde Dandy, MD  doxycycline (VIBRAMYCIN) 100 MG capsule Take 1 capsule (100 mg total) by mouth 2 (two) times daily. 07/30/16   Forde Dandy, MD  predniSONE (DELTASONE) 10 MG tablet Take 5 tablets (50 mg total) by mouth daily. 07/31/16 08/04/16  Forde Dandy, MD    Family History History reviewed. No pertinent family history.  Social History Social History  Substance Use Topics  . Smoking status: Former Research scientist (life sciences)  . Smokeless tobacco: Not on file  . Alcohol use Yes  Comment: Pt states no etoh since April 2014     Allergies   Penicillins   Review of Systems Review of Systems 10/14 systems reviewed and are negative other than those stated in the HPI   Physical Exam Updated Vital Signs BP (!) 157/105   Pulse 73   Temp 98 F (36.7 C) (Oral)   Resp 17   SpO2 100%   Physical Exam Physical Exam  Nursing note and vitals reviewed. Constitutional:  non-toxic, and in no acute distress Head: Normocephalic and atraumatic.  Mouth/Throat: Oropharynx is clear and moist.  Neck: Normal range of motion.  Neck supple.  Cardiovascular: Normal rate and regular rhythm.   Pulmonary/Chest: Effort normal. No conversational dyspnea. Faint expiratory wheezing in all lung fields. Abdominal: Soft. There is no tenderness. There is no rebound and no guarding.  Musculoskeletal: Normal range of motion.  Neurological: Alert, no facial droop, fluent speech, moves all extremities symmetrically Skin: Skin is warm and dry.  Psychiatric: Cooperative   ED Treatments / Results  Labs (all labs ordered are listed, but only abnormal results are displayed) Labs Reviewed  BASIC METABOLIC PANEL - Abnormal; Notable for the following:       Result Value   Glucose, Bld 108 (*)    All other components within normal limits  CBC  I-STAT TROPOININ, ED    EKG  EKG Interpretation  Date/Time:  Wednesday July 30 2016 16:51:21 EST Ventricular Rate:  89 PR Interval:  144 QRS Duration: 82 QT Interval:  388 QTC Calculation: 472 R Axis:   65 Text Interpretation:  Sinus rhythm with occasional Premature ventricular complexes Moderate voltage criteria for LVH, may be normal variant Nonspecific T wave abnormality Prolonged QT Abnormal ECG similar to last EKG  inferior changes seen on last EKG  Confirmed by LIU MD, DANA [phone]) on 07/30/2016 8:20:59 PM       Radiology Dg Chest 2 View  Result Date: 07/30/2016 CLINICAL DATA:  Productive cough. Shortness of breath. Congestion. Nausea and vomiting. Symptoms for 1 day. EXAM: CHEST  2 VIEW COMPARISON:  Two-view chest x-ray 10/16/2015 FINDINGS: Heart size is normal. Emphysematous changes are again noted. No focal airspace disease present. Degenerative changes in the mid thoracic spine are stable. Atherosclerotic changes are present at the aorta. IMPRESSION: 1. No acute cardiopulmonary disease or significant interval change. 2. Emphysema. 3. Aortic atherosclerosis. Electronically Signed   By: San Morelle M.D.   On: 07/30/2016 17:21    Procedures Procedures (including  critical care time) CRITICAL CARE Performed by: Forde Dandy   Total critical care time: 35 minutes  Critical care time was exclusive of separately billable procedures and treating other patients.  Critical care was necessary to treat or prevent imminent or life-threatening deterioration.  Critical care was time spent personally by me on the following activities: development of treatment plan with patient and/or surrogate as well as nursing, discussions with consultants, evaluation of patient's response to treatment, examination of patient, obtaining history from patient or surrogate, ordering and performing treatments and interventions, ordering and review of laboratory studies, ordering and review of radiographic studies, pulse oximetry and re-evaluation of patient's condition.  Medications Ordered in ED Medications  ipratropium-albuterol (DUONEB) 0.5-2.5 (3) MG/3ML nebulizer solution 3 mL (3 mLs Nebulization Given 07/30/16 1700)  predniSONE (DELTASONE) tablet 60 mg (60 mg Oral Given 07/30/16 2038)  ipratropium-albuterol (DUONEB) 0.5-2.5 (3) MG/3ML nebulizer solution 3 mL (3 mLs Nebulization Given 07/30/16 2117)  doxycycline (VIBRA-TABS) tablet 100 mg (100 mg Oral Given 07/30/16  2039)  naproxen (NAPROSYN) tablet 500 mg (500 mg Oral Given 07/30/16 2039)  albuterol (PROVENTIL HFA;VENTOLIN HFA) 108 (90 Base) MCG/ACT inhaler 2 puff (2 puffs Inhalation Given 07/30/16 2233)     Initial Impression / Assessment and Plan / ED Course  I have reviewed the triage vital signs and the nursing notes.  Pertinent labs & imaging results that were available during my care of the patient were reviewed by me and considered in my medical decision making (see chart for details).  Clinical Course     66 year old male who presents with shortness of breath, likely due to COPD versus asthma exacerbation. Is nontoxic in no acute distress with stable vital signs, normal work of breathing and normal oxygenation on room air.  With diffuse expiratory wheezing noted on exam and bronchospastic cough. Chest x-ray visualized and shows no acute cardiopulmonary processes but some emphysematous changes. Given a duo neb x 4, prednisone, doxycycline (as likely COPD given chronic tobacco use and emphysema on CXR). On reevaluation patient with clear breath sounds and feels significantly improved. Ambulates normally here in the emergency department. No concerns for atypical ACS or PE. Appropriate for discharge home. Strict return and follow-up instructions reviewed. He expressed understanding of all discharge instructions and felt comfortable with the plan of care.   Final Clinical Impressions(s) / ED Diagnoses   Final diagnoses:  Acute exacerbation of chronic obstructive pulmonary disease (COPD) ([place])  Mild persistent asthma with exacerbation    New Prescriptions Discharge Medication List as of 07/30/2016 10:25 PM    START taking these medications   Details  !! albuterol (PROVENTIL HFA;VENTOLIN HFA) 108 (90 Base) MCG/ACT inhaler Inhale 2 puffs into the lungs every 4 (four) hours as needed for wheezing or shortness of breath., Starting Wed 07/30/2016, Print    !! albuterol (PROVENTIL) (2.5 MG/3ML) 0.083% nebulizer solution Take 3 mLs (2.5 mg total) by nebulization every 4 (four) hours as needed for wheezing or shortness of breath., Starting Wed 07/30/2016, Print    doxycycline (VIBRAMYCIN) 100 MG capsule Take 1 capsule (100 mg total) by mouth 2 (two) times daily., Starting Wed 07/30/2016, Print    predniSONE (DELTASONE) 10 MG tablet Take 5 tablets (50 mg total) by mouth daily., Starting Thu 07/31/2016, Until Mon 08/04/2016, Print     !! - Potential duplicate medications found. Please discuss with provider.       Forde Dandy, MD 07/30/16 [phone]

## 2016-07-30 NOTE — ED Notes (Signed)
Pt stable, understands discharge instructions, and reasons for return.   

## 2016-07-30 NOTE — ED Notes (Signed)
Pt ambulated in hall. Maintained 98% O2 saturation while ambulating.

## 2016-07-30 NOTE — ED Triage Notes (Signed)
Presents with productive cough-white and yellow sputum, wheezing and SOB began last night. HX of asthma-used rescue inhaler with relief but has not helped today. Bilateral breath sounds faint expiratory wheezes.

## 2016-10-29 ENCOUNTER — Emergency Department (HOSPITAL_COMMUNITY)
Admission: EM | Admit: 2016-10-29 | Discharge: 2016-10-29 | Disposition: A | Discharge status: Home / Self Care | Payer: Medicaid Other | Attending: Physician Assistant | Admitting: Physician Assistant

## 2016-10-29 ENCOUNTER — Encounter (HOSPITAL_COMMUNITY): Payer: Self-pay | Admitting: *Deleted

## 2016-10-29 ENCOUNTER — Emergency Department (HOSPITAL_COMMUNITY): Payer: Medicaid Other

## 2016-10-29 DIAGNOSIS — J45901 Unspecified asthma with (acute) exacerbation: Secondary | ICD-10-CM | POA: Diagnosis not present

## 2016-10-29 DIAGNOSIS — I10 Essential (primary) hypertension: Secondary | ICD-10-CM | POA: Diagnosis not present

## 2016-10-29 DIAGNOSIS — Z87891 Personal history of nicotine dependence: Secondary | ICD-10-CM | POA: Diagnosis not present

## 2016-10-29 DIAGNOSIS — R0602 Shortness of breath: Secondary | ICD-10-CM | POA: Diagnosis present

## 2016-10-29 MED ORDER — ALBUTEROL SULFATE (2.5 MG/3ML) 0.083% IN NEBU
INHALATION_SOLUTION | RESPIRATORY_TRACT | Status: AC
  Filled 2016-10-29: qty 6

## 2016-10-29 MED ORDER — ALBUTEROL SULFATE (2.5 MG/3ML) 0.083% IN NEBU
5.0000 mg | INHALATION_SOLUTION | Freq: Once | RESPIRATORY_TRACT | Status: AC
  Administered 2016-10-29: 5 mg via RESPIRATORY_TRACT

## 2016-10-29 MED ORDER — ALBUTEROL SULFATE (2.5 MG/3ML) 0.083% IN NEBU
5.0000 mg | INHALATION_SOLUTION | Freq: Once | RESPIRATORY_TRACT | Status: AC
  Administered 2016-10-29: 5 mg via RESPIRATORY_TRACT
  Filled 2016-10-29: qty 6

## 2016-10-29 MED ORDER — DOXYCYCLINE HYCLATE 100 MG PO CAPS: 100.0000 mg | ORAL_CAPSULE | Freq: Two times a day (BID) | ORAL | 0 refills | Status: DC

## 2016-10-29 MED ORDER — METHYLPREDNISOLONE SODIUM SUCC 125 MG IJ SOLR
125.0000 mg | Freq: Once | INTRAMUSCULAR | Status: AC
  Administered 2016-10-29: 125 mg via INTRAMUSCULAR
  Filled 2016-10-29: qty 2

## 2016-10-29 MED ORDER — PREDNISONE 10 MG PO TABS: ORAL_TABLET | ORAL | 0 refills | Status: DC

## 2016-10-29 NOTE — ED Provider Notes (Signed)
[facility] DEPT Provider Note   CSN: [id number] Arrival date & time: 10/29/16  [id number]     History   Chief Complaint Chief Complaint  Patient presents with  . Asthma  . Shortness of Breath    HPI Joe Garcia is a 66 y.o. male.  The history is provided by the patient. No language interpreter was used.  Asthma  The current episode started yesterday. The problem has been gradually worsening. Associated symptoms include shortness of breath. Nothing relieves the symptoms. He has tried nothing for the symptoms. The treatment provided no relief.  Shortness of Breath  Associated medical issues include asthma.   Pt complains of asthma attack.  Pt reports no relief with inhaler or albuterol nebs at home.  Pt is not a smoker.  Pt reports a recent cough. Past Medical History:  Diagnosis Date  . Alcoholism ([place])   . Arthritis   . Asthma   . Depression   . Family history of anesthesia complication   . Hypertension     Patient Active Problem List   Diagnosis Date Noted  . Influenza A 07/17/2013  . Influenza 07/16/2013  . Asthma exacerbation in COPD ([place]) 07/16/2013  . SOB (shortness of breath) 07/16/2013  . Fever 07/16/2013  . Tachycardia 07/16/2013  . Alcohol abuse 09/25/2012  . Alcohol dependence ([place]) 09/22/2012  . HYPERTENSION 03/22/2007  . DEGENERATIVE DISC DISEASE 03/22/2007  . 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 No pertinent surgical history.     Home Medications    Prior to Admission medications   Medication Sig Start Date End Date Taking? Authorizing Provider  acetaminophen (TYLENOL) 500 MG tablet Take 1,000 mg by mouth every 6 (six) hours as needed for mild pain.    Historical Provider, MD  albuterol (PROVENTIL HFA;VENTOLIN HFA) 108 (90 BASE) MCG/ACT inhaler Inhale 1-2 puffs into the lungs every 6 (six) hours as needed for wheezing or shortness of breath. 07/12/15   Stevi Barrett, PA-C  albuterol (PROVENTIL HFA;VENTOLIN HFA) 108 (90 Base)  MCG/ACT inhaler Inhale 2 puffs into the lungs every 4 (four) hours as needed for wheezing or shortness of breath. 07/30/16   Forde Dandy, MD  albuterol (PROVENTIL) (2.5 MG/3ML) 0.083% nebulizer solution Take 3 mLs (2.5 mg total) by nebulization every 6 (six) hours as needed for wheezing or shortness of breath. 10/16/15   Tatyana Kirichenko, PA-C  albuterol (PROVENTIL) (2.5 MG/3ML) 0.083% nebulizer solution Take 3 mLs (2.5 mg total) by nebulization every 4 (four) hours as needed for wheezing or shortness of breath. 07/30/16   Forde Dandy, MD  doxycycline (VIBRAMYCIN) 100 MG capsule Take 1 capsule (100 mg total) by mouth 2 (two) times daily. 07/30/16   Forde Dandy, MD  ipratropium-albuterol (DUONEB) 0.5-2.5 (3) MG/3ML SOLN Take 3 mLs by nebulization every 2 (two) hours as needed (shortness of breath).     Historical Provider, MD  naproxen (NAPROSYN) 500 MG tablet Take 1 tablet (500 mg total) by mouth 2 (two) times daily as needed for moderate pain. 05/18/16   Katy Apo, NP    Family History History reviewed. No pertinent family history.  Social History Social History  Substance Use Topics  . Smoking status: Former Research scientist (life sciences)  . Smokeless tobacco: Not on file  . Alcohol use Yes     Comment: Pt states no etoh since April 2014     Allergies   Penicillins   Review of Systems Review of Systems  Respiratory: Positive for shortness of breath.  All other systems reviewed and are negative.    Physical Exam Updated Vital Signs BP 125/81   Pulse 69   Temp 98 F (36.7 C) (Oral)   Resp 18   SpO2 99%   Physical Exam  Constitutional: He appears well-developed and well-nourished.  HENT:  Head: Normocephalic and atraumatic.  Right Ear: External ear normal.  Left Ear: External ear normal.  Nose: Nose normal.  Mouth/Throat: Oropharynx is clear and moist.  Eyes: Conjunctivae are normal. Pupils are equal, round, and reactive to light.  Neck: Normal range of motion.  Cardiovascular: Normal  rate and regular rhythm.   Pulmonary/Chest: He has wheezes.  Abdominal: Soft.  Neurological: He is alert.  Skin: Skin is warm.  Psychiatric: He has a normal mood and affect.  Nursing note and vitals reviewed.    ED Treatments / Results  Labs (all labs ordered are listed, but only abnormal results are displayed) Labs Reviewed - No data to display  EKG  EKG Interpretation None       Radiology Dg Chest 2 View  Result Date: 10/29/2016 CLINICAL DATA:  Productive cough and chest congestion for the past 3 days. History of asthma, bronchitis, former smoker, alcoholism. EXAM: CHEST  2 VIEW COMPARISON:  PA and lateral chest x-ray of July 30, 2016 FINDINGS: The lungs are mildly hyperinflated. The interstitial markings are coarse though stable. There is no alveolar infiltrate or pleural effusion. No pulmonary parenchymal nodules are observed. The heart and pulmonary vascularity are normal. The mediastinum is normal in width. There is calcification in the wall of the aortic arch. There is multilevel degenerative disc disease of the thoracic spine with mild chronic wedging of approximately T8. IMPRESSION: Mild chronic bronchitic changes.  No alveolar pneumonia nor CHF. Thoracic aortic atherosclerosis. Electronically Signed   By: David  Martinique M.D.   On: 10/29/2016 09:10    Procedures Procedures (including critical care time)  Medications Ordered in ED Medications  albuterol (PROVENTIL) (2.5 MG/3ML) 0.083% nebulizer solution 5 mg (5 mg Nebulization Given 10/29/16 0740)  albuterol (PROVENTIL) (2.5 MG/3ML) 0.083% nebulizer solution 5 mg (5 mg Nebulization Given 10/29/16 0844)  methylPREDNISolone sodium succinate (SOLU-MEDROL) 125 mg/2 mL injection 125 mg (125 mg Intramuscular Given 10/29/16 0941)     Initial Impression / Assessment and Plan / ED Course  I have reviewed the triage vital signs and the nursing notes.  Pertinent labs & imaging results that were available during my care of the patient  were reviewed by me and considered in my medical decision making (see chart for details).     Pt feels better after breathing treatments x3.  Pt given solumedrol IM.  Pt has neb solution and inhaler at home.  Pt given rx for prednisone and doxycycline/  Pt is advised to see his MD for recheck.  Final Clinical Impressions(s) / ED Diagnoses   Final diagnoses:  Moderate asthma with exacerbation, unspecified whether persistent    New Prescriptions New Prescriptions   DOXYCYCLINE (VIBRAMYCIN) 100 MG CAPSULE    Take 1 capsule (100 mg total) by mouth 2 (two) times daily.   PREDNISONE (DELTASONE) 10 MG TABLET    6,5,4,3,2,1 taper  An After Visit Summary was printed and given to the patient.    Hollace Kinnier Carter, PA-C 10/29/16 [place], MD 10/30/16 1531

## 2016-10-29 NOTE — ED Triage Notes (Signed)
Pt reports having asthma, increase in sob and cough this am. Reports cough is productive, no relief with inhalers and neb at home.

## 2016-10-29 NOTE — ED Notes (Signed)
Pt ambulatory at discharge. Verbalizes understanding of discharge instructions. NAD.

## 2017-05-04 ENCOUNTER — Emergency Department (HOSPITAL_COMMUNITY): Payer: Medicaid Other

## 2017-05-04 ENCOUNTER — Observation Stay (HOSPITAL_COMMUNITY)
Admission: EM | Admit: 2017-05-04 | Discharge: 2017-05-06 | Disposition: A | Discharge status: Home / Self Care | Payer: Medicaid Other | Attending: Internal Medicine | Admitting: Internal Medicine

## 2017-05-04 ENCOUNTER — Encounter (HOSPITAL_COMMUNITY): Payer: Self-pay | Admitting: *Deleted

## 2017-05-04 DIAGNOSIS — Z23 Encounter for immunization: Secondary | ICD-10-CM | POA: Diagnosis not present

## 2017-05-04 DIAGNOSIS — Z880 Allergy status to penicillin: Secondary | ICD-10-CM

## 2017-05-04 DIAGNOSIS — J9600 Acute respiratory failure, unspecified whether with hypoxia or hypercapnia: Secondary | ICD-10-CM | POA: Diagnosis present

## 2017-05-04 DIAGNOSIS — I10 Essential (primary) hypertension: Secondary | ICD-10-CM | POA: Diagnosis not present

## 2017-05-04 DIAGNOSIS — Z825 Family history of asthma and other chronic lower respiratory diseases: Secondary | ICD-10-CM

## 2017-05-04 DIAGNOSIS — R0603 Acute respiratory distress: Secondary | ICD-10-CM | POA: Diagnosis not present

## 2017-05-04 DIAGNOSIS — I5022 Chronic systolic (congestive) heart failure: Secondary | ICD-10-CM | POA: Diagnosis not present

## 2017-05-04 DIAGNOSIS — Z79899 Other long term (current) drug therapy: Secondary | ICD-10-CM | POA: Diagnosis not present

## 2017-05-04 DIAGNOSIS — J441 Chronic obstructive pulmonary disease with (acute) exacerbation: Secondary | ICD-10-CM | POA: Insufficient documentation

## 2017-05-04 DIAGNOSIS — Z7289 Other problems related to lifestyle: Secondary | ICD-10-CM | POA: Diagnosis not present

## 2017-05-04 DIAGNOSIS — Z8249 Family history of ischemic heart disease and other diseases of the circulatory system: Secondary | ICD-10-CM

## 2017-05-04 DIAGNOSIS — J449 Chronic obstructive pulmonary disease, unspecified: Secondary | ICD-10-CM | POA: Diagnosis not present

## 2017-05-04 DIAGNOSIS — E876 Hypokalemia: Secondary | ICD-10-CM

## 2017-05-04 DIAGNOSIS — Z87891 Personal history of nicotine dependence: Secondary | ICD-10-CM

## 2017-05-04 DIAGNOSIS — M1990 Unspecified osteoarthritis, unspecified site: Secondary | ICD-10-CM | POA: Diagnosis not present

## 2017-05-04 DIAGNOSIS — Z8679 Personal history of other diseases of the circulatory system: Secondary | ICD-10-CM | POA: Diagnosis not present

## 2017-05-04 DIAGNOSIS — J4551 Severe persistent asthma with (acute) exacerbation: Secondary | ICD-10-CM | POA: Diagnosis not present

## 2017-05-04 DIAGNOSIS — F329 Major depressive disorder, single episode, unspecified: Secondary | ICD-10-CM

## 2017-05-04 DIAGNOSIS — I110 Hypertensive heart disease with heart failure: Secondary | ICD-10-CM | POA: Diagnosis not present

## 2017-05-04 DIAGNOSIS — F1021 Alcohol dependence, in remission: Secondary | ICD-10-CM | POA: Insufficient documentation

## 2017-05-04 DIAGNOSIS — J45901 Unspecified asthma with (acute) exacerbation: Secondary | ICD-10-CM

## 2017-05-04 LAB — COMPREHENSIVE METABOLIC PANEL
ALK PHOS: 79 U/L (ref 38–126)
ALT: 11 U/L — ABNORMAL LOW (ref 17–63)
AST: 14 U/L — ABNORMAL LOW (ref 15–41)
Albumin: 3.7 g/dL (ref 3.5–5.0)
Anion gap: 10 (ref 5–15)
BILIRUBIN TOTAL: 1.2 mg/dL (ref 0.3–1.2)
BUN: 10 mg/dL (ref 6–20)
CALCIUM: 8.8 mg/dL — AB (ref 8.9–10.3)
CO2: 23 mmol/L (ref 22–32)
CREATININE: 1.03 mg/dL (ref 0.61–1.24)
Chloride: 105 mmol/L (ref 101–111)
GFR calc non Af Amer: >60 mL/min (ref >60)
GLUCOSE: 114 mg/dL — AB (ref 65–99)
POTASSIUM: 3.3 mmol/L — AB (ref 3.5–5.1)
SODIUM: 138 mmol/L (ref 135–145)
Total Protein: 6.9 g/dL (ref 6.5–8.1)

## 2017-05-04 LAB — BRAIN NATRIURETIC PEPTIDE: B Natriuretic Peptide: 509.8 pg/mL — ABNORMAL HIGH (ref 0.0–100.0)

## 2017-05-04 LAB — CBC WITH DIFFERENTIAL/PLATELET
Basophils Absolute: 0 10*3/uL (ref 0.0–0.1)
Basophils Relative: 0 %
EOS ABS: 0.2 10*3/uL (ref 0.0–0.7)
Eosinophils Relative: 4 %
HEMATOCRIT: 41.9 % (ref 39.0–52.0)
HEMOGLOBIN: 14.2 g/dL (ref 13.0–17.0)
LYMPHS ABS: 2.8 10*3/uL (ref 0.7–4.0)
Lymphocytes Relative: 45 %
MCH: 28.3 pg (ref 26.0–34.0)
MCHC: 33.9 g/dL (ref 30.0–36.0)
MCV: 83.6 fL (ref 78.0–100.0)
MONO ABS: 0.3 10*3/uL (ref 0.1–1.0)
MONOS PCT: 5 %
NEUTROS PCT: 46 %
Neutro Abs: 2.9 10*3/uL (ref 1.7–7.7)
Platelets: 209 10*3/uL (ref 150–400)
RBC: 5.01 MIL/uL (ref 4.22–5.81)
RDW: 14.6 % (ref 11.5–15.5)
WBC: 6.2 10*3/uL (ref 4.0–10.5)

## 2017-05-04 LAB — I-STAT TROPONIN, ED: Troponin i, poc: 0.01 ng/mL (ref 0.00–0.08)

## 2017-05-04 MED ORDER — PREDNISONE 20 MG PO TABS
40.0000 mg | ORAL_TABLET | Freq: Every day | ORAL | Status: DC
  Administered 2017-05-05 – 2017-05-06 (×2): 40 mg via ORAL
  Filled 2017-05-04 (×2): qty 2

## 2017-05-04 MED ORDER — CHLORTHALIDONE 25 MG PO TABS
25.0000 mg | ORAL_TABLET | Freq: Every day | ORAL | Status: DC
  Administered 2017-05-04 – 2017-05-06 (×3): 25 mg via ORAL
  Filled 2017-05-04 (×3): qty 1

## 2017-05-04 MED ORDER — ALBUTEROL (5 MG/ML) CONTINUOUS INHALATION SOLN
15.0000 mg/h | INHALATION_SOLUTION | Freq: Once | RESPIRATORY_TRACT | Status: AC
  Administered 2017-05-04: 15 mg/h via RESPIRATORY_TRACT
  Filled 2017-05-04: qty 60

## 2017-05-04 MED ORDER — IPRATROPIUM BROMIDE 0.02 % IN SOLN
0.5000 mg | Freq: Once | RESPIRATORY_TRACT | Status: AC
  Administered 2017-05-04: 0.5 mg via RESPIRATORY_TRACT
  Filled 2017-05-04: qty 2.5

## 2017-05-04 MED ORDER — INFLUENZA VAC SPLIT QUAD 0.5 ML IM SUSY
0.5000 mL | PREFILLED_SYRINGE | INTRAMUSCULAR | Status: AC
  Administered 2017-05-05: 0.5 mL via INTRAMUSCULAR
  Filled 2017-05-04: qty 0.5

## 2017-05-04 MED ORDER — IPRATROPIUM-ALBUTEROL 0.5-2.5 (3) MG/3ML IN SOLN
3.0000 mL | RESPIRATORY_TRACT | Status: DC
  Administered 2017-05-04 (×3): 3 mL via RESPIRATORY_TRACT
  Filled 2017-05-04 (×3): qty 3

## 2017-05-04 MED ORDER — POTASSIUM CHLORIDE CRYS ER 20 MEQ PO TBCR
40.0000 meq | EXTENDED_RELEASE_TABLET | Freq: Once | ORAL | Status: AC
  Administered 2017-05-04: 40 meq via ORAL
  Filled 2017-05-04: qty 2

## 2017-05-04 MED ORDER — ENOXAPARIN SODIUM 40 MG/0.4ML ~~LOC~~ SOLN
40.0000 mg | SUBCUTANEOUS | Status: DC
  Administered 2017-05-04 – 2017-05-05 (×2): 40 mg via SUBCUTANEOUS
  Filled 2017-05-04 (×2): qty 0.4

## 2017-05-04 MED ORDER — MAGNESIUM SULFATE 2 GM/50ML IV SOLN
2.0000 g | Freq: Once | INTRAVENOUS | Status: AC
  Administered 2017-05-04: 2 g via INTRAVENOUS
  Filled 2017-05-04: qty 50

## 2017-05-04 MED FILL — Albuterol Sulfate Soln Nebu 0.5% (5 MG/ML): RESPIRATORY_TRACT | Qty: 20 | Status: AC

## 2017-05-04 NOTE — ED Notes (Signed)
Attempted report to 5W. RN unavailable at this time.

## 2017-05-04 NOTE — ED Notes (Signed)
Lunch ordered by Network engineer

## 2017-05-04 NOTE — ED Provider Notes (Signed)
[name] DEPT Provider Note   CSN: [id number] Arrival date & time: 05/04/17  4010     History   Chief Complaint Chief Complaint  Patient presents with  . Respiratory Distress    HPI Joe Garcia is a 66 y.o. male.  Patient is a 66 year old gentleman with a history of alcoholism, asthma, hypertension presenting today by EMS for acute onset of shortness of breath. Patient states he felt fine when he woke up and then suddenly began feeling very short of breath and sweaty. When EMS arrived patient was satting 97% with a respiratory rate of 30 and evidence of distress. He had diffuse wheezing and was given albuterol, Atrovent, Solu-Medrol and placed on BiPAP with significant improvement. Patient was significantly hypertensive on arrival but he denies any chest pain, swelling in his legs or abdomen. No prior history of CHF. He denies any prior heart history. He denies any cigarette use or drug use.  Patient states he felt well yesterday. He denies fever, productive cough, abdominal pain, nausea or vomiting.   The history is provided by the patient and the EMS personnel.    Past Medical History:  Diagnosis Date  . Alcoholism ([place])   . Arthritis   . Asthma   . Depression   . Family history of anesthesia complication   . Hypertension     Patient Active Problem List   Diagnosis Date Noted  . Influenza A 07/17/2013  . Influenza 07/16/2013  . Asthma exacerbation in COPD ([place]) 07/16/2013  . SOB (shortness of breath) 07/16/2013  . Fever 07/16/2013  . Tachycardia 07/16/2013  . Alcohol abuse 09/25/2012  . Alcohol dependence ([place]) 09/22/2012  . HYPERTENSION 03/22/2007  . DEGENERATIVE DISC DISEASE 03/22/2007  . 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 No pertinent surgical history.     Home Medications    Prior to Admission medications   Medication Sig Start Date End Date Taking? Authorizing Provider  acetaminophen (TYLENOL) 500 MG tablet Take 1,000 mg by  mouth every 6 (six) hours as needed for mild pain.    [provider]  albuterol (PROVENTIL HFA;VENTOLIN HFA) 108 (90 BASE) MCG/ACT inhaler Inhale 1-2 puffs into the lungs every 6 (six) hours as needed for wheezing or shortness of breath. 07/12/15   Barrett, Lahoma Crocker, PA-C  albuterol (PROVENTIL HFA;VENTOLIN HFA) 108 (90 Base) MCG/ACT inhaler Inhale 2 puffs into the lungs every 4 (four) hours as needed for wheezing or shortness of breath. 07/30/16   Forde Dandy, MD  albuterol (PROVENTIL) (2.5 MG/3ML) 0.083% nebulizer solution Take 3 mLs (2.5 mg total) by nebulization every 6 (six) hours as needed for wheezing or shortness of breath. 10/16/15   Kirichenko, Lahoma Rocker, PA-C  albuterol (PROVENTIL) (2.5 MG/3ML) 0.083% nebulizer solution Take 3 mLs (2.5 mg total) by nebulization every 4 (four) hours as needed for wheezing or shortness of breath. 07/30/16   Forde Dandy, MD  doxycycline (VIBRAMYCIN) 100 MG capsule Take 1 capsule (100 mg total) by mouth 2 (two) times daily. 10/29/16   Fransico Meadow, PA-C  ipratropium-albuterol (DUONEB) 0.5-2.5 (3) MG/3ML SOLN Take 3 mLs by nebulization every 2 (two) hours as needed (shortness of breath).     [provider]  naproxen (NAPROSYN) 500 MG tablet Take 1 tablet (500 mg total) by mouth 2 (two) times daily as needed for moderate pain. 05/18/16   Katy Apo, NP  predniSONE (DELTASONE) 10 MG tablet 6,5,4,3,2,1 taper 10/29/16   Sidney Ace    Family History  No family history on file.  Social History Social History  Substance Use Topics  . Smoking status: Former Research scientist (life sciences)  . Smokeless tobacco: Never Used  . Alcohol use Yes     Comment: Pt states no etoh since April 2014     Allergies   Penicillins   Review of Systems Review of Systems  All other systems reviewed and are negative.    Physical Exam Updated Vital Signs BP (!) 175/95   Pulse 81   Temp (!) 96.5 F (35.8 C) (Temporal)   Resp (!) 27   Ht 5\' 11"  (1.803 m)   Wt 72.6  kg (160 lb)   SpO2 100%   BMI 22.32 kg/m   Physical Exam  Constitutional: He is oriented to person, place, and time. He appears well-developed and well-nourished. No distress.  HENT:  Head: Normocephalic and atraumatic.  Mouth/Throat: Oropharynx is clear and moist.  Eyes: Pupils are equal, round, and reactive to light. Conjunctivae and EOM are normal.  Neck: Normal range of motion. Neck supple. No JVD present.  Cardiovascular: Normal rate, regular rhythm and intact distal pulses.   No murmur heard. Pulmonary/Chest: Accessory muscle usage present. Tachypnea noted. No respiratory distress. He has wheezes in the right upper field, the right middle field and the left upper field. He has no rhonchi. He has no rales.  Abdominal: Soft. He exhibits no distension. There is no tenderness. There is no rebound and no guarding.  Musculoskeletal: Normal range of motion. He exhibits no edema or tenderness.  Neurological: He is alert and oriented to person, place, and time.  Skin: Skin is warm and dry. No rash noted. No erythema.  Psychiatric: He has a normal mood and affect. His behavior is normal.  Nursing note and vitals reviewed.    ED Treatments / Results  Labs (all labs ordered are listed, but only abnormal results are displayed) Labs Reviewed  COMPREHENSIVE METABOLIC PANEL - Abnormal; Notable for the following:       Result Value   Potassium 3.3 (*)    Glucose, Bld 114 (*)    Calcium 8.8 (*)    AST 14 (*)    ALT 11 (*)    All other components within normal limits  BRAIN NATRIURETIC PEPTIDE - Abnormal; Notable for the following:    B Natriuretic Peptide 509.8 (*)    All other components within normal limits  CBC WITH DIFFERENTIAL/PLATELET  I-STAT TROPONIN, ED    EKG  EKG Interpretation  Date/Time:  Monday May 04 2017 08:38:45 EDT Ventricular Rate:  102 PR Interval:    QRS Duration: 82 QT Interval:  376 QTC Calculation: 490 R Axis:   62 Text Interpretation:  Sinus  tachycardia Probable left atrial enlargement LVH with secondary repolarization abnormality Borderline prolonged QT interval T wave inversion RESOLVED SINCE PREVIOUS Confirmed by Blanchie Dessert [phone]) on 05/04/2017 9:02:09 AM       Radiology Dg Chest Port 1 View  Result Date: 05/04/2017 CLINICAL DATA:  Shortness of Breath EXAM: PORTABLE CHEST 1 VIEW COMPARISON:  10/29/2016 FINDINGS: Mild hyperinflation. Heart and mediastinal contours are within normal limits. No focal opacities or effusions. No acute bony abnormality. IMPRESSION: Hyperinflation.  No active disease. Electronically Signed   By: Rolm Baptise M.D.   On: 05/04/2017 08:51    Procedures Procedures (including critical care time)  Medications Ordered in ED Medications  albuterol (PROVENTIL,VENTOLIN) solution continuous neb (15 mg/hr Nebulization Given 05/04/17 0848)  ipratropium (ATROVENT) nebulizer solution 0.5 mg (0.5 mg Nebulization Given  05/04/17 0848)  magnesium sulfate IVPB 2 g 50 mL (2 g Intravenous New Bag/Given 05/04/17 0909)     Initial Impression / Assessment and Plan / ED Course  I have reviewed the triage vital signs and the nursing notes.  Pertinent labs & imaging results that were available during my care of the patient were reviewed by me and considered in my medical decision making (see chart for details).     Patient presenting today in acute respiratory distress thought to be most likely related to asthma. Patient's denying any chest pain and has no physical exam findings of fluid overload. When BiPAP was removed briefly he became tachypnea and had bilateral wheezing worse on the right. Patient did receive albuterol, Atrovent, Solu-Medrol and CPAP by EMS with significant improvement. He denies any infectious symptoms leading to the worsening shortness of breath. Low likelihood for dissection today, PE, MI or CHF. EKG is unchanged. Chest x-ray without evidence of fluid overload.   Labs are consistent with a  negative troponin, normal CBC, CMP without acute findings. BNP mildly elevated at 509. After an hour-long continuous albuterol med, more Atrovent and magnesium wheezing is significantly improved. Will admit for further care.  CRITICAL CARE Performed by: Blanchie Dessert Total critical care time: 30 minutes Critical care time was exclusive of separately billable procedures and treating other patients. Critical care was necessary to treat or prevent imminent or life-threatening deterioration. Critical care was time spent personally by me on the following activities: development of treatment plan with patient and/or surrogate as well as nursing, discussions with consultants, evaluation of patient's response to treatment, examination of patient, obtaining history from patient or surrogate, ordering and performing treatments and interventions, ordering and review of laboratory studies, ordering and review of radiographic studies, pulse oximetry and re-evaluation of patient's condition.  Final Clinical Impressions(s) / ED Diagnoses   Final diagnoses:  Severe persistent asthma with exacerbation  Acute respiratory failure, unspecified whether with hypoxia or hypercapnia (HCC)    New Prescriptions New Prescriptions   No medications on file     Blanchie Dessert, MD 05/04/17 1037

## 2017-05-04 NOTE — Progress Notes (Signed)
Pt came In to ED by EMS on cpap in respiratory distress. Pt taken off cpap and placed on NIV PC/bipap 10/5 R 10, 40%. Pt unable to speak in full sentences, increased WOB, bilateral inspiratory/expiratory wheezes. Pt reports bipap helping significantly. Continuous neb initiated through bipap. RN at bedside. RT will continue to closely monitor pt.

## 2017-05-04 NOTE — ED Notes (Signed)
Ginger ale given per MD

## 2017-05-04 NOTE — ED Notes (Signed)
Pt eating lunch from meal tray.

## 2017-05-04 NOTE — ED Triage Notes (Signed)
Pt here via [facility] for respiratory distress.  States awoke and was fine.  Acute onset sob.  Initial ra sats fo 97%, rr 34 in tripod position. NSR with frequent pvc's.  Given 10 albuterol, 0.5 atrovent and 125 mg solumedrol en-route.  Arrives on C-pap.

## 2017-05-04 NOTE — H&P (Signed)
Date: 05/04/2017               Patient Name:  Joe Garcia MRN: [id number]  DOB: 1951/01/27 Age / Sex: 66 y.o., male   PCP: [facility]         Medical Service: Internal Medicine Teaching Service         Attending Physician: Dr. Rebeca Alert, Raynaldo Opitz, MD    First Contact: Dr. Berneice Gandy Pager: [phone]  Second Contact: Dr. Heber [place]  Pager: [phone]       After Hours (After 5p/  First Contact Pager: [phone]  weekends / holidays): Second Contact Pager: [phone]   Chief Complaint: shortness of breath  History of Present Illness: Patient is a 66 year old male with a past medical history of hypertension, COPD, asthma, depression and alcohol use disorder presenting to the hospital via EMS after experiencing shortness of breath this morning. Patient states he was wheezing this morning and having difficulty breathing while ambulating in his house. He tried using his inhaler and nebulizer with no improvement in his symptoms. Also reports having diaphoresis and a nonproductive cough this morning. Denies having any associated chest pain or nausea. States he was diagnosed with asthma as an adult. He is not aware of having any environmental allergies. States he has been using his albuterol nebulizer at home 3 times a day for the past week and has been having worsening nocturnal symptoms as well. States he has been hospitalized for asthma exacerbations in the past but never been intubated. Patient does report experiencing chest pain in the past. States on average he experiences substernal chest pain 3-4 times in a month which he attributes to "gas." These prior episodes of chest pain have occurred at rest and have been associated with occasional dyspnea, diaphoresis, and nausea. He denies having any orthopnea or lower extremity edema. Denies having any cough prior to today. Denies having any fevers or chills. States his appetite is good. No other complaints.  Vitals per EMS: 97%  saturation (unclear if on RA), respiratory rate 34, and EKG showing normal sinus rhythm with frequent PVCs. Patient was given albuterol, Atrovent, and 125 mg Solu-Medrol en route to the hospital. In addition, he was placed on CPAP. In the ED, patient was initially  transitioned to BiPAP and then later deescalated to 3 L oxygen via nasal cannula after he experienced improvement in his shortness of breath. Patient also received DuoNeb and mag sulfate in the ED. Per conversation with ED provider, he was found to be wheezing when he initially presented to the hospital. Labs showing potassium 3.3, BNP 509, and negative I-STAT troponin. CBC unremarkable. Chest x-ray showing hyperinflated lungs. EKG showing sinus tachycardia with heart rate 102, left ventricular hypertrophy, and probable left atrial enlargement.  Meds:  Current Meds  Medication Sig  . acetaminophen (TYLENOL) 500 MG tablet Take 1,000 mg by mouth every 6 (six) hours as needed for mild pain.  Marland Kitchen albuterol (PROVENTIL HFA;VENTOLIN HFA) 108 (90 BASE) MCG/ACT inhaler Inhale 1-2 puffs into the lungs every 6 (six) hours as needed for wheezing or shortness of breath.  Marland Kitchen albuterol (PROVENTIL) (2.5 MG/3ML) 0.083% nebulizer solution Take 3 mLs (2.5 mg total) by nebulization every 6 (six) hours as needed for wheezing or shortness of breath.     Allergies: Allergies as of 05/04/2017 - Review Complete 05/04/2017  Allergen Reaction Noted  . Penicillins     Past Medical History:  Diagnosis Date  . Alcoholism ([place])   .  Arthritis   . Asthma   . Depression   . Family history of anesthesia complication   . Hypertension     Family History: Sister has asthma. Father died of heart failure in his 35s.  Social History: Smoking 3 cigarettes per day since age of 76; quit 5 years ago. States he was an "alcoholic" before and quit 5 years ago.  Reports prior history of illicit drug use "a long time ago," denies any current illicit drug use.  Review of  Systems: A complete ROS was negative except as per HPI.  Physical Exam: Blood pressure (!) 152/78, pulse 64, temperature 97.8 F (36.6 C), temperature source Temporal, resp. rate 10, height 5\' 11"  (1.803 m), weight 160 lb (72.6 kg), SpO2 100 %. Physical Exam  Constitutional: He is oriented to person, place, and time. He appears well-developed and well-nourished.  HENT:  Head: Normocephalic and atraumatic.  Mouth/Throat: Oropharynx is clear and moist.  Eyes: Right eye exhibits no discharge. Left eye exhibits no discharge.  Neck: Neck supple. No tracheal deviation present.  Cardiovascular: Normal rate, regular rhythm and intact distal pulses.  Exam reveals no gallop and no friction rub.   No murmur heard. Pulmonary/Chest: He has no rales.  On 3 L oxygen via nasal cannula No accessory muscle use Speaking comfortably in full sentences Diffuse mild end expiratory wheezing  Abdominal: Soft. Bowel sounds are normal. He exhibits no distension. There is no tenderness.  Musculoskeletal: He exhibits no edema.  Neurological: He is alert and oriented to person, place, and time.  Skin: Skin is warm and dry.  Psychiatric: His behavior is normal.    EKG: personally reviewed my interpretation is sinus tachycardia with heart rate 102, left ventricular hypertrophy, and probable left atrial enlargement.  CXR: personally reviewed my interpretation is hyperinflated lungs.  Assessment & Plan by Problem: Active Problems:   Asthma exacerbation in COPD [facility])  66 year old male with a past medical history of hypertension, COPD, asthma, depression and alcohol use disorder presenting to the hospital via EMS after experiencing shortness of breath this morning.  Asthma exacerbation in COPD: Patient has a history of asthma and COPD. No prior PFTs on file.He is now presenting with worsening shortness of breath and wheezing for the past 1 week. Noted to be wheezing on exam. Found to be tachypnic on arrival. He was  initially on BiPAP in the ED and is now satting well with 3 L oxygen via nasal cannula. He has responded well to treatments delivered by EMS/ ED including DuoNeb, mag sulfate, and Solu-Medrol 125 mg. Chest x-ray not suggestive of pneumonia. BNP elevated at 509 (no prior baseline), however, patient does not have a documented history of congestive heart failure and is euvolemic on exam. ACS less likely as initial troponin negative and EKG not suggestive of any acute ischemic changes. Wells score low risk (1.5) based on tachycardia on arrival. Will hold off checking d-dimer at this time as patient does not have any significant risk factors for PE and his tachycardia could be explained by his asthma exacerbation. -Admit to telemetry -Supplemental oxygen as needed -Prednisone 40 mg oral tomorrow morning  -DuoNeb every 4 hours -Repeat EKG in the morning  Mild hypokalemia: Potassium 3.3. Patient is not on a diuretic at home and reports good PO intake. -K-Dur 40 meq once   -Repeat BMP in a.m.  Hypertension: Blood pressure 158/101 on arrival. Most recent 152/78. -Start chlorthalidone 25 mg daily -Repeat BMP in a.m. to monitor potassium level. Replete potassium  as needed.  Hx of angina: No current CP and did not experience CP this morning. No symptoms of heart failure. Troponin negative and EKG not suggestive of any acute ischemic changes. States on average he experiences substernal chest pain 3-4 times in a month which he attributes to "gas." These prior episodes of chest pain have occurred at rest and have been associated with occasional dyspnea, diaphoresis, and nausea. He does have risk factors for coronary artery disease including hypertension, hyperlipidemia, age, and prior tobacco use. -Echo ordered  -Patient will need an outpatient stress test for further workup  Alcohol use disorder: He has a prior history of alcohol use disorder and refers to himself as a former "alcoholic." Denies current ethanol  use. -CIWA monitoring  Diet: Heart healthy DVT prophylaxis: Lovenox  Dispo: Admit patient to Observation with expected length of stay less than 2 midnights.  Signed: Shela Leff, MD 05/04/2017, 1:50 PM  Pager: [phone]

## 2017-05-04 NOTE — Progress Notes (Signed)
Pt taken off bipap and placed on 3L [place]. Pt denies SOB, no increased WOB. Pt reports breathing is "a whole lot better". Pt knows to call RN/RT if breathing worsens and needs to go back on bipap. RN aware. RT will continue to closely monitor pt.

## 2017-05-04 NOTE — ED Notes (Signed)
Friend at bedside.

## 2017-05-05 ENCOUNTER — Other Ambulatory Visit (HOSPITAL_COMMUNITY): Payer: Self-pay

## 2017-05-05 DIAGNOSIS — Z23 Encounter for immunization: Secondary | ICD-10-CM | POA: Diagnosis not present

## 2017-05-05 DIAGNOSIS — I10 Essential (primary) hypertension: Secondary | ICD-10-CM | POA: Diagnosis not present

## 2017-05-05 DIAGNOSIS — I110 Hypertensive heart disease with heart failure: Secondary | ICD-10-CM | POA: Diagnosis not present

## 2017-05-05 DIAGNOSIS — J45901 Unspecified asthma with (acute) exacerbation: Secondary | ICD-10-CM | POA: Diagnosis not present

## 2017-05-05 DIAGNOSIS — J449 Chronic obstructive pulmonary disease, unspecified: Secondary | ICD-10-CM | POA: Diagnosis not present

## 2017-05-05 DIAGNOSIS — R0603 Acute respiratory distress: Secondary | ICD-10-CM | POA: Diagnosis not present

## 2017-05-05 DIAGNOSIS — J441 Chronic obstructive pulmonary disease with (acute) exacerbation: Secondary | ICD-10-CM | POA: Diagnosis not present

## 2017-05-05 DIAGNOSIS — E876 Hypokalemia: Secondary | ICD-10-CM | POA: Diagnosis present

## 2017-05-05 DIAGNOSIS — J4551 Severe persistent asthma with (acute) exacerbation: Secondary | ICD-10-CM | POA: Diagnosis not present

## 2017-05-05 LAB — BASIC METABOLIC PANEL
Anion gap: 8 (ref 5–15)
BUN: 16 mg/dL (ref 6–20)
CO2: 25 mmol/L (ref 22–32)
Calcium: 9.2 mg/dL (ref 8.9–10.3)
Chloride: 103 mmol/L (ref 101–111)
Creatinine, Ser: 1.01 mg/dL (ref 0.61–1.24)
GFR calc Af Amer: >60 mL/min (ref >60)
GFR calc non Af Amer: >60 mL/min (ref >60)
Glucose, Bld: 126 mg/dL — ABNORMAL HIGH (ref 65–99)
Potassium: 4.3 mmol/L (ref 3.5–5.1)
Sodium: 136 mmol/L (ref 135–145)

## 2017-05-05 LAB — HIV ANTIBODY (ROUTINE TESTING W REFLEX): HIV Screen 4th Generation wRfx: NONREACTIVE

## 2017-05-05 MED ORDER — ACETAMINOPHEN 325 MG PO TABS
650.0000 mg | ORAL_TABLET | Freq: Four times a day (QID) | ORAL | Status: DC | PRN
  Administered 2017-05-05: 650 mg via ORAL
  Filled 2017-05-05: qty 2

## 2017-05-05 MED ORDER — IPRATROPIUM-ALBUTEROL 0.5-2.5 (3) MG/3ML IN SOLN
3.0000 mL | Freq: Three times a day (TID) | RESPIRATORY_TRACT | Status: DC
  Administered 2017-05-05 – 2017-05-06 (×4): 3 mL via RESPIRATORY_TRACT
  Filled 2017-05-05 (×4): qty 3

## 2017-05-05 NOTE — Progress Notes (Signed)
Responded to request for Advanced Directives.  Patient says he wanted to paperwork but no one ever brought it.  Chaplain provided patient with education around the AD and patient said he would like to have it himself to look over it.  Chaplain made patient aware that it can be completed with a notary while he is here in the hospital if he would like that.    05/05/17 1108  Clinical Encounter Type  Visited With Patient  Visit Type Initial;Psychological support;Spiritual support

## 2017-05-05 NOTE — Progress Notes (Signed)
Informed Dr. Berneice Gandy of patient's 8 beat run of vtach. Pt asymptomatic.

## 2017-05-05 NOTE — Progress Notes (Signed)
Subjective:  Patient was seen laying comfortably in bed this AM without Joe Garcia in place. Joe Garcia showed no signs of respiratory distress and states that his breathing is much improved from the event that brought him to the hospital. Joe Garcia has had multiple breathing treatments while inpatient and expresses that Joe Garcia continues to feel progressively better with each breathing treatment.   Objective:  Vital signs in last 24 hours: Vitals:   05/04/17 2316 05/05/17 0859 05/05/17 1013 05/05/17 1421  BP:   129/63 140/68  Pulse:  75 62 62  Resp:  18 (!) 24   Temp:   97.6 F (36.4 C) 97.8 F (36.6 C)  TempSrc:   Oral Oral  SpO2: 98% 97% 97% 96%  Weight:      Height:       Physical Exam  Constitutional: Joe Garcia appears well-developed and well-nourished. No distress.  Cardiovascular: Normal rate, regular rhythm and intact distal pulses.  Exam reveals no friction rub.   No murmur heard. Pulmonary/Chest: Effort normal. No respiratory distress. Joe Garcia has wheezes (scattered, intermittent end-expiratory wheezing throughout all long fields). Joe Garcia has no rales.  Patient sitting comfortably in bed without [place] in place. Patient speaking in full sentences without SOB. NO accessory muscle use or nasal flaring.  Abdominal: Soft. Joe Garcia exhibits no distension. There is no tenderness. There is no guarding.  Musculoskeletal: Joe Garcia exhibits no edema (of bilateral lower extremities) or tenderness (of bilateral lower extremities).  Skin: Skin is warm and dry. Capillary refill takes less than 2 seconds. No erythema. No pallor.   Assessment/Plan:  Principal Problem:   Asthma exacerbation in COPD [facility])  Joe Garcia is 66 yo with PMH of uncontrolled hypertension, COPD, asthma, depression and alcohol use disorder who presented to the hospital via EMS with acute respiratory distress. Joe Garcia was admitted to the internal medicine teaching service for management. The specific problems addressed during his admission are as follows:  Acute  respiratory distress 2/2 asthma exacerbation in COPD: Patient initially presented with acute onset SOB and diffuse wheezing, which responded to IV Mag, steroids, and DuoNebs. His history was not concerning for acute COPD exacerbation, as Joe Garcia did not have progressively worsening cough or worsening SOB prior to the onset of his symptoms that precipitated his hospitalization. The patient continues to improve clinically with treatment for asthma exacerbation, as Joe Garcia was not hypoxic during examination, breathing comfortably without accessory muscle use, and had only intermittent wheezing throughout lung fields. Will continue treatment for asthma exacerbation. Given that the patient reported BID albuterol use at home and intermittent nighttime cough on admission, it is most likely that the patient will need better long term asthma control as outpatient.  -Admit to telemetry -Supplemental oxygen as needed -Prednisone 40 mgx 5 days (started 05/05/2017) -Continue DuoNebs TID -Consider LABA+ICS on discharge for asthma maintenance medication  Hypertension: Blood pressure elevated on arrival and patient not previously on antihypertensive medication. Patient started on diuretic on admission and BP at goal of <140/90 today. Will continue to monitor -Continue daily chlorthalidone 25 mg  -Repeat BMP in AM to monitor potassium level, as patient hypokalemic on admission requiring repletion  Hx of angina: The patient endorsed prior episodes of increased chest pressure, which were associated with occasional dyspnea, diaphoresis, and nausea. Joe Garcia does have risk factors for coronary artery disease including hypertension, hyperlipidemia, age, and prior tobacco use. The patient's EKG on admission showed LA enlargement, possible LVH, and his BNP was elevated to 509, all concerning for CHF. While the patient does  not appear volume overloaded on exam, it is improtant to rule out cardiac disease given his risk factors and symptoms.     -Echo ordered, awaiting results -Patient will need an outpatient stress test for further workup  Alcohol use disorder: Joe Garcia has a prior history of alcohol use disorder and refers to himself as a former "alcoholic." Denies current ethanol use. -CIWA monitoring  Diet: Heart healthy DVT prophylaxis: Lovenox  Dispo: Anticipated discharge in approximately 1-2 day(s).   Joe Ripple, MD 05/05/2017, 2:31 PM Pager: [phone]

## 2017-05-06 ENCOUNTER — Observation Stay (HOSPITAL_BASED_OUTPATIENT_CLINIC_OR_DEPARTMENT_OTHER): Payer: Medicaid Other

## 2017-05-06 DIAGNOSIS — I340 Nonrheumatic mitral (valve) insufficiency: Secondary | ICD-10-CM

## 2017-05-06 DIAGNOSIS — R9431 Abnormal electrocardiogram [ECG] [EKG]: Secondary | ICD-10-CM | POA: Diagnosis not present

## 2017-05-06 LAB — BASIC METABOLIC PANEL
Anion gap: 13 (ref 5–15)
BUN: 17 mg/dL (ref 6–20)
CO2: 24 mmol/L (ref 22–32)
Calcium: 9.6 mg/dL (ref 8.9–10.3)
Chloride: 100 mmol/L — ABNORMAL LOW (ref 101–111)
Creatinine, Ser: 1.03 mg/dL (ref 0.61–1.24)
GFR calc Af Amer: >60 mL/min (ref >60)
GFR calc non Af Amer: >60 mL/min (ref >60)
Glucose, Bld: 113 mg/dL — ABNORMAL HIGH (ref 65–99)
Potassium: 4.1 mmol/L (ref 3.5–5.1)
Sodium: 137 mmol/L (ref 135–145)

## 2017-05-06 LAB — ECHOCARDIOGRAM COMPLETE
Height: 71 in
Weight: 2560 oz

## 2017-05-06 MED ORDER — IPRATROPIUM-ALBUTEROL 0.5-2.5 (3) MG/3ML IN SOLN: 3.0000 mL | Freq: Two times a day (BID) | RESPIRATORY_TRACT | Status: DC

## 2017-05-06 MED ORDER — FLUTICASONE PROPIONATE HFA 110 MCG/ACT IN AERO: 2.0000 | INHALATION_SPRAY | Freq: Two times a day (BID) | RESPIRATORY_TRACT | 2 refills | Status: DC

## 2017-05-06 MED ORDER — LISINOPRIL 10 MG PO TABS: 10.0000 mg | ORAL_TABLET | Freq: Every day | ORAL | 11 refills | Status: DC

## 2017-05-06 MED ORDER — PREDNISONE 20 MG PO TABS: 40.0000 mg | ORAL_TABLET | Freq: Every day | ORAL | 0 refills | Status: AC

## 2017-05-06 NOTE — Progress Notes (Signed)
*  PRELIMINARY RESULTS* Echocardiogram 2D Echocardiogram has been performed.  Leavy Cella 05/06/2017, 3:38 PM

## 2017-05-06 NOTE — Progress Notes (Signed)
Colin Ina to be D/C'd Home per MD order.  Discussed with the patient and all questions fully answered.  VSS, Skin clean, dry and intact without evidence of skin break down, no evidence of skin tears noted. IV catheter discontinued intact. Site without signs and symptoms of complications. Dressing and pressure applied.  An After Visit Summary was printed and given to the patient. Patient received prescription.  D/c education completed with patient/family including follow up instructions, medication list, d/c activities limitations if indicated, with other d/c instructions as indicated by MD - patient able to verbalize understanding, all questions fully answered.   Patient instructed to return to ED, call 911, or call MD for any changes in condition.   Patient escorted via [place], and D/C home via private auto.  Holley Raring 05/06/2017 5:53 PM

## 2017-05-06 NOTE — Progress Notes (Signed)
MD notified of 8 beats of PVCs and earlier beats of V tach. Pt is asymptomatic.

## 2017-05-06 NOTE — Progress Notes (Signed)
   Subjective:  Patient was seen sitting comfortably in bed this AM in no acute distress. The patient states that he has continued to improve over the course of his hospitalization and feels ready to go home. He inquired about his asthma symptoms and we discussed use of an additional inhaler (fluticasone) to help control his asthma symptoms, which occur daily and including nonproductive cough and shortness of breath with exertion. He was instructed on how to properly take this medication and to follow up with PCP upon discharge.   Objective:  Vital signs in last 24 hours: Vitals:   05/05/17 2101 05/05/17 2125 05/06/17 0456 05/06/17 0915  BP:  (!) 147/80 (!) 149/73   Pulse:  74 (!) 52 72  Resp:  18 18 18   Temp:  98.2 F (36.8 C) 97.9 F (36.6 C)   TempSrc:  Oral Oral   SpO2: 99% 97% 93% 95%  Weight:      Height:       Physical Exam  Constitutional: He appears well-developed and well-nourished. No distress.  Cardiovascular: Normal rate, regular rhythm and intact distal pulses.  Exam reveals no friction rub.   No murmur heard. Pulmonary/Chest: Effort normal.  Patient not wearing [place], breathing comfortably on room air. No accessory muscle use or nasal flaring. Scarce scattered end-expiratory wheezing throughout lung fields, interval improvement from yesterday's exam. No crackles observed.  Abdominal: Soft. He exhibits no distension. There is no tenderness. There is no guarding.  Musculoskeletal: He exhibits no edema (of bilateral lower extremities) or tenderness (of bilateral lower extremities).  Skin: Skin is warm and dry. Capillary refill takes less than 2 seconds. No erythema. No pallor.   Assessment/Plan:  Principal Problem:   Asthma exacerbation in COPD [facility])  Joe Garcia is 66 yo with PMH of uncontrolled hypertension, COPD, asthma, depression and alcohol use disorder who presented to the hospital via EMS with acute respiratory distress. He was admitted to the internal  medicine teaching service for management. The specific problems addressed during his admission are as follows:  Acute respiratory distress 2/2 asthma exacerbation complicated by COPD: Patient initially presented with acute onset SOB and diffuse wheezing, which responded to IV Mag, steroids, and DuoNebs. The patient continues to clinically improve with DuoNebs and has minimal wheezing observed on exam. Patient stable for discharge from respiratory standpoint. -Prednisone 40 mg day 2 of 5 days today (started 05/05/2017) -Continue DuoNebs TID -Will prescribe fluticasone on discharge to increase asthma maintenance medication   Hypertension: Blood pressure elevated on arrival and patient not previously on antihypertensive medication. Patient started on diuretic this admission and he will follow up with PCP as outpatient -Continue daily chlorthalidone 25 mg  -Repeat BMP today shows no electrolyte abnormalities.  Hx of angina and possible CHF The patient endorsed prior episodes of increased chest pressure, which were associated with occasional dyspnea, diaphoresis, and nausea. He does have risk factors for coronary artery disease including hypertension, hyperlipidemia, age, and prior tobacco use. Still awaiting echocardiography to rule out CHF as a cause of his EKG findings and elevated BNP on admission.  -Echo ordered, called today and patient on schedule for this today  Alcohol use disorder: He has a prior history of alcohol use disorder and refers to himself as a former "alcoholic." Denies current ethanol use. -CIWA monitoring, not needing Ativan  IVF: None Diet: Heart healthy DVT prophylaxis:Lovenox  Dispo: Anticipated discharge today.   Thomasene Ripple, MD 05/06/2017, 11:32 AM Pager: [phone]

## 2017-05-06 NOTE — Discharge Summary (Signed)
Name: Joe Garcia MRN: [id number] DOB: 1950-11-11 66 y.o. PCP: Medicine, [facility]  Date of Admission: 05/04/2017  8:34 AM Date of Discharge: 05/06/2017 Attending Physician: Oda Kilts, MD  Discharge Diagnosis:  Principal Problem:   Asthma exacerbation in COPD [facility])  Discharge Medications: Allergies as of 05/06/2017      Reactions   Penicillins    REACTION: "stomach pains"      Medication List    STOP taking these medications   doxycycline 100 MG capsule Commonly known as:  VIBRAMYCIN     TAKE these medications   acetaminophen 500 MG tablet Commonly known as:  TYLENOL Take 1,000 mg by mouth every 6 (six) hours as needed for mild pain.   albuterol 108 (90 Base) MCG/ACT inhaler Commonly known as:  PROVENTIL HFA;VENTOLIN HFA Inhale 1-2 puffs into the lungs every 6 (six) hours as needed for wheezing or shortness of breath.   albuterol (2.5 MG/3ML) 0.083% nebulizer solution Commonly known as:  PROVENTIL Take 3 mLs (2.5 mg total) by nebulization every 6 (six) hours as needed for wheezing or shortness of breath.   fluticasone 110 MCG/ACT inhaler Commonly known as:  FLOVENT HFA Inhale 2 puffs into the lungs 2 (two) times daily.   ipratropium-albuterol 0.5-2.5 (3) MG/3ML Soln Commonly known as:  DUONEB Take 3 mLs by nebulization every 2 (two) hours as needed (shortness of breath).   lisinopril 10 MG tablet Commonly known as:  PRINIVIL,ZESTRIL Take 1 tablet (10 mg total) by mouth daily.   naproxen 500 MG tablet Commonly known as:  NAPROSYN Take 1 tablet (500 mg total) by mouth 2 (two) times daily as needed for moderate pain.   predniSONE 20 MG tablet Commonly known as:  DELTASONE Take 2 tablets (40 mg total) by mouth daily with breakfast. What changed:  medication strength  how much to take  how to take this  when to take this  additional instructions       Disposition and follow-up:   JoeNewman E Garcia was discharged  from [facility] in Good condition.  At the hospital follow up visit please address:  1.  Joe Joe Garcia presented to the ED via EMS with acute onset respiratory distress. He was found to have diffuse wheezing on exam and ultimately treated for an asthma exacerbation. He was instructed to start Flovent daily in addition to PRN albuterol upon discharge to get better control of baseline asthma symptoms. Please assess how the patient is doing on this medication and if his baseline symptoms of daily cough and SOB requiring PRN albuterol have improved since starting Flovent.  The patient endorsed history of chest pain, although denied chest pain throughout admission. His EKG on admission showed LVH and he had multiple risk factors for CAD and CHF. Patient had echocardiogram which showed LVEF of 30-35%. Outpatient follow up with cardiology arranged on discharge.   Patient was also found to be hypertensive upon arrival. Given the patient's new diagnosis of CHF, patient was started on lisinopril 10 mg on discharge. Please assess how the patient is doing on this medication, assess for symptoms of hypotension, and reevaluate his BP to see if increased dose of this medication is needed.  2.  Labs / imaging needed at time of follow-up: BMP to assess renal function on lisinopril (baseline Cr 1.0, eGFR >60)  3.  Pending labs/ test needing follow-up: None Follow-up Appointments: Follow-up Information    Medicine, [facility]. Call in 1  day(s).   Why:  Please call your primary care physicina after dishcarge from the hospital to follow up the results of your echocardiography and to discuss the new medications started during your hospital stay. Please take paperwork with you for your provider to review. Contact information: [place] [address] [phone]        Lorretta Harp, MD. Go to.   Specialties:  Cardiology, Radiology Why:  Please follow up with  Dr. Quay Burow after discharge from the hospital. They will call you to arrange follow up. If they do not call you within 1 week of discharge, please use this ifnormation on this paperwork to schedule an appointment soon. Contact information: [address] [place] [address] [phone]           Hospital Course by problem list: Principal Problem:   Asthma exacerbation in COPD [facility])   Joe Garcia is 66 yo with PMH of uncontrolled hypertension, COPD, asthma, depression and alcohol use disorder who presented to the hospital via EMS with acute respiratory distress. He was admitted to the internal medicine teaching service for management. The specific problems addressed during his admission are as follows:  Acute respiratory distress 2/2 asthma exacerbation in COPD: Patient initially presented with acute onset SOB and diffuse wheezing, which responded to IV Mag, steroids, and DuoNebs. His history was not concerning for acute COPD exacerbation, as he did not have progressively worsening cough or worsening SOB prior to the onset of his symptoms that precipitated his hospitalization. The patient continued to improve with administration of DuoNebs three times a day while in the hospital. The patient was breathing comfortably on room air without wheezing or signs of respiratory distress on the day of discharge. The patient reported a hx of daily albuterol use (sometimes up to 3 times a day) and intermittent nighttime cough on admission, so we prescribed him a daily ICS to use upon discharge for better control of his baseline symptoms. Would recommend pulmonary function testing as an outpatient to assess for COPD and asthma in this patient, as no formal testing was noted in his chart.  New diagnosis of HFrEF (echo on 05/06/17 showed LV EF of 30-35%): The patient endorsed prior episodes of increased chest pressure, which were associated with occasional dyspnea, diaphoresis, and  nausea. He does have risk factors for coronary artery disease including hypertension, hyperlipidemia, age, and prior tobacco use. The patient's EKG on admission showed LA enlargement, possible LVH, and his BNP was elevated to 509, all concerning for CHF. While the patient does not appear volume overloaded on exam, we wanted to rule out CHF given his risk factors and symptoms. Echocardiography was obtained on 10/10 and showed LV EF of 30-35% making this a new diagnosis of heart failure. Cardiology was called with these findings and they recommended outpatient follow up, which will be arranged with Dr. Quay Burow upon discharge and ACE/ARB for management of HTN.   Hypertension: The patient's blood pressure was elevated on arrival and he was started on chlorthalidone 25 mg, daily. His electrolytes were monitored and no disturbances were noted while taking this medication. After diagnosis of CHF, the patient's HTN medication was switched from chlorthalidone 25 mg to lisinopril 10 mg daily. Patient will follow up as outpatient with cardiology.   Discharge Vitals:   BP 138/90   Pulse 64   Temp 98 F (36.7 C) (Oral)   Resp 16   Ht _0  (1.803 m)  Wt 160 lb (72.6 kg)   SpO2 96%   BMI 22.32 kg/m   Pertinent Labs, Studies, and Procedures:   BMP BMP Latest Ref Rng & Units 05/06/2017 05/05/2017 05/04/2017  Glucose 65 - 99 mg/dL 113(H) 126(H) 114(H)  BUN 6 - 20 mg/dL _0 Creatinine 0.61 - 1.24 mg/dL 1.03 1.01 1.03  Sodium 135 - 145 mmol/L 137 136 138  Potassium 3.5 - 5.1 mmol/L 4.1 4.3 3.3(L)  Chloride 101 - 111 mmol/L 100(L) 103 105  CO2 22 - 32 mmol/L _1 Calcium 8.9 - 10.3 mg/dL 9.6 9.2 8.8(L)   CBC    Component Value Date/Time   WBC 6.2 05/04/2017 0859   RBC 5.01 05/04/2017 0859   HGB 14.2 05/04/2017 0859   HCT 41.9 05/04/2017 0859   PLT 209 05/04/2017 0859   MCV 83.6 05/04/2017 0859   MCH 28.3 05/04/2017 0859   MCHC 33.9 05/04/2017 0859   RDW 14.6 05/04/2017 0859    LYMPHSABS 2.8 05/04/2017 0859   MONOABS 0.3 05/04/2017 0859   EOSABS 0.2 05/04/2017 0859   BASOSABS 0.0 05/04/2017 0859   BNP = 509.8 ISTAT troponin = 0.01 HIV antibody = Non-reactive  Chest X Ray 05/04/17: FINDINGS: Mild hyperinflation. Heart and mediastinal contours are within normal limits. No focal opacities or effusions. No acute bony abnormality.  IMPRESSION: Hyperinflation.  No active disease.  Echocardiography from 05/06/17: Study Conclusions - Left ventricle: The cavity size was moderately dilated. Wall   thickness was normal. Systolic function was moderately to   severely reduced. The estimated ejection fraction was in the   range of 30% to 35%. Diffuse hypokinesis. Doppler parameters are   consistent with abnormal left ventricular relaxation (grade 1   diastolic dysfunction). - Ventricular septum: Septal motion showed abnormal function and   dyssynergy. - Mitral valve: Mildly thickened leaflets . There was mild   regurgitation. - Right atrium: Central venous pressure (est): 3 mm Hg. - Atrial septum: No defect or patent foramen ovale was identified. - Tricuspid valve: There was trivial regurgitation. - Pulmonary arteries: PA peak pressure: 36 mm Hg (S). - Pericardium, extracardiac: There was no pericardial effusion.  Impressions - Normal LV wall thickness with moderate chamber dilatation and   LVEF approximately 30-35%. There is diffuse hypokinesis with   regional variation and septal dyssynergy. Grade 1 diastolic   dysfunction. Mildly thickened mitral leaflets with mild mitral   regurgitation. Trivial tricuspid regurgitation with PASP   estimated 36 mmHg.  Discharge Instructions: Discharge Instructions    Call MD for:  difficulty breathing, headache or visual disturbances    Complete by:  As directed    Call MD for:  persistant dizziness or light-headedness    Complete by:  As directed    Call MD for:  temperature >100.4    Complete by:  As directed     Diet - low sodium heart healthy    Complete by:  As directed    Discharge instructions    Complete by:  As directed    You were evaluated for an acute asthma attack. You were given multiple breathing treatments and oral steroids, both of which helped your breathing. Please continue to take your oral steroid for the next 3 days. You will take two, 20 mg prednisone tablets every morning for the next 3 days. After this you will not need to take any more of this medication.   To get better control of your asthma symptoms every day, we have  prescribed an additional inhaler, Flovent, to use when you leave the hospital. Please use this Flovent inhaler two times a day, every day. You will take two puffs every time you use this inhaler. You can then use your albuterol inhaler whenever you have worsening cough or shortness of breath.   We evaluated your heart function while here in the hospital. We found out that your heart does not pump as well as it should. This may be due to high blood pressure. To help your hearts function and decrease your blood pressure you were started on lisinopril, 10 mg daily. Please take one 10 mg tablet every day. Please follow up with your primary care physician regarding this medication. You will also need to follow up with a cardiologist, Dr. Quay Burow, after discharge from the hospital regarding your heart function. This doctorwill work with your primary doctor moving forward to help make sure your heart function does not worsen over time. Please follow up with them after you leave the hospital. They will contact you to arrange follow up.   Increase activity slowly    Complete by:  As directed       Signed: Thomasene Ripple, MD 05/06/2017, 5:06 PM   Pager: [phone]

## 2017-05-06 NOTE — Discharge Instructions (Signed)

## 2017-05-08 ENCOUNTER — Telehealth: Payer: Self-pay | Admitting: Cardiovascular Disease

## 2017-05-08 NOTE — Telephone Encounter (Signed)
Called patient and left a VM to call me back to schedule post hospital followup.

## 2017-06-10 ENCOUNTER — Emergency Department (HOSPITAL_COMMUNITY): Payer: Medicaid Other

## 2017-06-10 ENCOUNTER — Encounter (HOSPITAL_COMMUNITY): Payer: Self-pay | Admitting: Emergency Medicine

## 2017-06-10 ENCOUNTER — Other Ambulatory Visit: Payer: Self-pay

## 2017-06-10 ENCOUNTER — Inpatient Hospital Stay (HOSPITAL_COMMUNITY)
Admission: EM | Admit: 2017-06-10 | Discharge: 2017-06-15 | DRG: 189 | Disposition: A | Discharge status: Home / Self Care | Payer: Medicaid Other | Attending: Internal Medicine | Admitting: Internal Medicine

## 2017-06-10 DIAGNOSIS — I110 Hypertensive heart disease with heart failure: Secondary | ICD-10-CM | POA: Diagnosis present

## 2017-06-10 DIAGNOSIS — I10 Essential (primary) hypertension: Secondary | ICD-10-CM | POA: Diagnosis present

## 2017-06-10 DIAGNOSIS — J45901 Unspecified asthma with (acute) exacerbation: Secondary | ICD-10-CM | POA: Diagnosis present

## 2017-06-10 DIAGNOSIS — J4542 Moderate persistent asthma with status asthmaticus: Secondary | ICD-10-CM | POA: Diagnosis present

## 2017-06-10 DIAGNOSIS — J9621 Acute and chronic respiratory failure with hypoxia: Secondary | ICD-10-CM | POA: Diagnosis present

## 2017-06-10 DIAGNOSIS — R739 Hyperglycemia, unspecified: Secondary | ICD-10-CM | POA: Diagnosis not present

## 2017-06-10 DIAGNOSIS — T380X5A Adverse effect of glucocorticoids and synthetic analogues, initial encounter: Secondary | ICD-10-CM | POA: Diagnosis not present

## 2017-06-10 DIAGNOSIS — I5043 Acute on chronic combined systolic (congestive) and diastolic (congestive) heart failure: Secondary | ICD-10-CM

## 2017-06-10 DIAGNOSIS — I493 Ventricular premature depolarization: Secondary | ICD-10-CM

## 2017-06-10 DIAGNOSIS — I5042 Chronic combined systolic (congestive) and diastolic (congestive) heart failure: Secondary | ICD-10-CM

## 2017-06-10 DIAGNOSIS — J45902 Unspecified asthma with status asthmaticus: Secondary | ICD-10-CM

## 2017-06-10 DIAGNOSIS — Z87891 Personal history of nicotine dependence: Secondary | ICD-10-CM

## 2017-06-10 DIAGNOSIS — I429 Cardiomyopathy, unspecified: Secondary | ICD-10-CM | POA: Diagnosis present

## 2017-06-10 DIAGNOSIS — E785 Hyperlipidemia, unspecified: Secondary | ICD-10-CM | POA: Diagnosis present

## 2017-06-10 DIAGNOSIS — I428 Other cardiomyopathies: Secondary | ICD-10-CM

## 2017-06-10 DIAGNOSIS — F1020 Alcohol dependence, uncomplicated: Secondary | ICD-10-CM | POA: Diagnosis present

## 2017-06-10 DIAGNOSIS — F329 Major depressive disorder, single episode, unspecified: Secondary | ICD-10-CM | POA: Diagnosis present

## 2017-06-10 HISTORY — DX: Hyperlipidemia, unspecified: E78.5

## 2017-06-10 HISTORY — DX: Heart failure, unspecified: I50.9

## 2017-06-10 LAB — CBC
HCT: 40.9 % (ref 39.0–52.0)
Hemoglobin: 13.6 g/dL (ref 13.0–17.0)
MCH: 28.2 pg (ref 26.0–34.0)
MCHC: 33.3 g/dL (ref 30.0–36.0)
MCV: 84.7 fL (ref 78.0–100.0)
PLATELETS: 282 10*3/uL (ref 150–400)
RBC: 4.83 MIL/uL (ref 4.22–5.81)
RDW: 14.5 % (ref 11.5–15.5)
WBC: 8.8 10*3/uL (ref 4.0–10.5)

## 2017-06-10 LAB — I-STAT TROPONIN, ED: TROPONIN I, POC: 0 ng/mL (ref 0.00–0.08)

## 2017-06-10 LAB — BASIC METABOLIC PANEL
Anion gap: 11 (ref 5–15)
BUN: 12 mg/dL (ref 6–20)
CALCIUM: 9 mg/dL (ref 8.9–10.3)
CO2: 23 mmol/L (ref 22–32)
CREATININE: 1.02 mg/dL (ref 0.61–1.24)
Chloride: 102 mmol/L (ref 101–111)
Glucose, Bld: 88 mg/dL (ref 65–99)
Potassium: 3.5 mmol/L (ref 3.5–5.1)
SODIUM: 136 mmol/L (ref 135–145)

## 2017-06-10 MED ORDER — ALBUTEROL (5 MG/ML) CONTINUOUS INHALATION SOLN
10.0000 mg/h | INHALATION_SOLUTION | RESPIRATORY_TRACT | Status: DC
  Administered 2017-06-10: 10 mg/h via RESPIRATORY_TRACT

## 2017-06-10 MED ORDER — METHYLPREDNISOLONE SODIUM SUCC 125 MG IJ SOLR
125.0000 mg | Freq: Once | INTRAMUSCULAR | Status: AC
  Administered 2017-06-10: 125 mg via INTRAVENOUS
  Filled 2017-06-10: qty 2

## 2017-06-10 MED ORDER — MAGNESIUM SULFATE 2 GM/50ML IV SOLN
2.0000 g | Freq: Once | INTRAVENOUS | Status: AC
  Administered 2017-06-10: 2 g via INTRAVENOUS
  Filled 2017-06-10: qty 50

## 2017-06-10 MED ORDER — DOXYCYCLINE HYCLATE 100 MG PO TABS
100.0000 mg | ORAL_TABLET | Freq: Once | ORAL | Status: AC
  Administered 2017-06-11: 100 mg via ORAL
  Filled 2017-06-10: qty 1

## 2017-06-10 MED ORDER — IPRATROPIUM BROMIDE 0.02 % IN SOLN
0.5000 mg | Freq: Once | RESPIRATORY_TRACT | Status: AC
  Administered 2017-06-10: 0.5 mg via RESPIRATORY_TRACT
  Filled 2017-06-10: qty 2.5

## 2017-06-10 MED ORDER — SODIUM CHLORIDE 0.9 % IV SOLN
INTRAVENOUS | Status: DC
  Administered 2017-06-10: 21:00:00 via INTRAVENOUS

## 2017-06-10 MED ORDER — ALBUTEROL (5 MG/ML) CONTINUOUS INHALATION SOLN
10.0000 mg/h | INHALATION_SOLUTION | RESPIRATORY_TRACT | Status: DC
  Administered 2017-06-10: 10 mg/h via RESPIRATORY_TRACT
  Filled 2017-06-10: qty 20

## 2017-06-10 MED ORDER — ALBUTEROL SULFATE (2.5 MG/3ML) 0.083% IN NEBU
5.0000 mg | INHALATION_SOLUTION | Freq: Once | RESPIRATORY_TRACT | Status: AC
  Administered 2017-06-10: 5 mg via RESPIRATORY_TRACT
  Filled 2017-06-10: qty 6

## 2017-06-10 NOTE — ED Provider Notes (Signed)
[facility] EMERGENCY DEPARTMENT Provider Note   CSN: [id number] Arrival date & time: 06/10/17  [place]     History   Chief Complaint Chief Complaint  Patient presents with  . Shortness of Breath    HPI   Blood pressure (!) 200/128, pulse (!) 103, temperature 98.6 F (37 C), temperature source Oral, resp. rate 17, height 5\' 11"  (1.803 m), weight 70.3 kg (155 lb), SpO2 98 %.  Joe Garcia is a 66 y.o. male with past medical history significant for asthma, CHF, hypertension (states he has been compliant with his medications), remote alcohol abuse (states he has not drank in 6 years) complaining of shortness of breath, wheezing, productive cough over the last 3 days.  He has been giving himself nebulizer treatments at home with little relief.  He denies increasing peripheral edema, orthopnea, PND, fever, chills.   Past Medical History:  Diagnosis Date  . Alcoholism ([place])   . Arthritis   . Asthma   . CHF (congestive heart failure) ([place])   . Depression   . Family history of anesthesia complication   . Hypertension     Patient Active Problem List   Diagnosis Date Noted  . Influenza A 07/17/2013  . Influenza 07/16/2013  . Asthma exacerbation in COPD ([place]) 07/16/2013  . SOB (shortness of breath) 07/16/2013  . Fever 07/16/2013  . Tachycardia 07/16/2013  . Alcohol abuse 09/25/2012  . Alcohol dependence ([place]) 09/22/2012  . HYPERTENSION 03/22/2007  . DEGENERATIVE DISC DISEASE 03/22/2007  . 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 No pertinent surgical history.     Home Medications    Prior to Admission medications   Medication Sig Start Date End Date Taking? Authorizing Provider  albuterol (PROVENTIL HFA;VENTOLIN HFA) 108 (90 BASE) MCG/ACT inhaler Inhale 1-2 puffs into the lungs every 6 (six) hours as needed for wheezing or shortness of breath. 07/12/15  Yes Barrett, Lahoma Crocker, PA-C  albuterol (PROVENTIL) (2.5 MG/3ML) 0.083% nebulizer solution  Take 3 mLs (2.5 mg total) by nebulization every 6 (six) hours as needed for wheezing or shortness of breath. 10/16/15  Yes Kirichenko, Tatyana, PA-C  lisinopril (PRINIVIL,ZESTRIL) 10 MG tablet Take 1 tablet (10 mg total) by mouth daily. 05/06/17 05/06/18 Yes Nedrud, Larena Glassman, MD  fluticasone (FLOVENT HFA) 110 MCG/ACT inhaler Inhale 2 puffs into the lungs 2 (two) times daily. Patient not taking: Reported on 06/10/2017 05/06/17 05/06/18  Thomasene Ripple, MD  naproxen (NAPROSYN) 500 MG tablet Take 1 tablet (500 mg total) by mouth 2 (two) times daily as needed for moderate pain. Patient not taking: Reported on 06/10/2017 05/18/16   Katy Apo, NP    Family History History reviewed. No pertinent family history.  Social History Social History   Tobacco Use  . Smoking status: Former Research scientist (life sciences)  . Smokeless tobacco: Never Used  Substance Use Topics  . Alcohol use: Yes    Comment: Pt states no etoh since April 2014  . Drug use: Yes    Comment: Former drug user "quit 7 months ago"     Allergies   Penicillins   Review of Systems Review of Systems  A complete review of systems was obtained and all systems are negative except as noted in the HPI and PMH.   Physical Exam Updated Vital Signs BP 104/74   Pulse 78   Temp 98.6 F (37 C) (Oral)   Resp 19   Ht 5\' 11"  (1.803 m)   Wt 70.3 kg (155 lb)   SpO2 100%  BMI 21.62 kg/m   Physical Exam  Constitutional: He is oriented to person, place, and time. He appears well-developed and well-nourished. No distress.  HENT:  Head: Normocephalic.  Mouth/Throat: Oropharynx is clear and moist.  Eyes: Conjunctivae are normal.  Neck: Normal range of motion. No JVD present. No tracheal deviation present.  Cardiovascular: Normal rate, regular rhythm and intact distal pulses.  Radial pulse equal bilaterally  Pulmonary/Chest: He is in respiratory distress. He has wheezes.  Tripoding, speaking in short sentences, diffuse severe expiratory  wheezing.  Moderate air movement.  Positive accessory muscle use.  Abdominal: Soft. He exhibits no distension and no mass. There is no tenderness. There is no rebound and no guarding.  Musculoskeletal: Normal range of motion. He exhibits no edema or tenderness.  No calf asymmetry, superficial collaterals, palpable cords, edema, Homans sign negative bilaterally.    Neurological: He is alert and oriented to person, place, and time.  Skin: Skin is warm. He is not diaphoretic.  Psychiatric: He has a normal mood and affect.  Nursing note and vitals reviewed.    ED Treatments / Results  Labs (all labs ordered are listed, but only abnormal results are displayed) Labs Reviewed  BASIC METABOLIC PANEL  CBC  BRAIN NATRIURETIC PEPTIDE  I-STAT TROPONIN, ED    EKG  EKG Interpretation None       Radiology Dg Chest 2 View  Result Date: 06/10/2017 CLINICAL DATA:  Shortness of breath 3 days.  History of asthma. EXAM: CHEST  2 VIEW COMPARISON:  05/04/2017 and 10/29/2016 . FINDINGS: Lungs are adequately inflated without focal consolidation or effusion. Cardiomediastinal silhouette is within normal. There is moderate degenerative change of the spine. Mild stable wedging of several adjacent midthoracic vertebral bodies IMPRESSION: No active cardiopulmonary disease. Electronically Signed   By: Marin Olp M.D.   On: 06/10/2017 17:44    Procedures Procedures (including critical care time)  CRITICAL CARE Performed by: Monico Blitz   Total critical care time: 35 minutes  Critical care time was exclusive of separately billable procedures and treating other patients.  Critical care was necessary to treat or prevent imminent or life-threatening deterioration.  Critical care was time spent personally by me on the following activities: development of treatment plan with patient and/or surrogate as well as nursing, discussions with consultants, evaluation of patient's response to treatment,  examination of patient, obtaining history from patient or surrogate, ordering and performing treatments and interventions, ordering and review of laboratory studies, ordering and review of radiographic studies, pulse oximetry and re-evaluation of patient's condition.   Medications Ordered in ED Medications  0.9 %  sodium chloride infusion ( Intravenous New Bag/Given 06/10/17 2120)  albuterol (PROVENTIL,VENTOLIN) solution continuous neb (10 mg/hr Nebulization New Bag/Given 06/10/17 2104)  albuterol (PROVENTIL,VENTOLIN) solution continuous neb (not administered)  doxycycline (VIBRA-TABS) tablet 100 mg (not administered)  albuterol (PROVENTIL) (2.5 MG/3ML) 0.083% nebulizer solution 5 mg (5 mg Nebulization Given 06/10/17 1706)  methylPREDNISolone sodium succinate (SOLU-MEDROL) 125 mg/2 mL injection 125 mg (125 mg Intravenous Given 06/10/17 2056)  magnesium sulfate IVPB 2 g 50 mL (0 g Intravenous Stopped 06/10/17 2157)  ipratropium (ATROVENT) nebulizer solution 0.5 mg (0.5 mg Nebulization Given 06/10/17 2104)     Initial Impression / Assessment and Plan / ED Course  I have reviewed the triage vital signs and the nursing notes.  Pertinent labs & imaging results that were available during my care of the patient were reviewed by me and considered in my medical decision making (see chart for details).  Vitals:   06/10/17 2104 06/10/17 2115 06/10/17 2130 06/10/17 2200  BP:  (!) 154/119 (!) 176/96 104/74  Pulse: 93 (!) 40 81 78  Resp: 16 14 14 19   Temp:      TempSrc:      SpO2: 96% 100% 100% 100%  Weight:      Height:        Medications  0.9 %  sodium chloride infusion ( Intravenous New Bag/Given 06/10/17 2120)  albuterol (PROVENTIL,VENTOLIN) solution continuous neb (10 mg/hr Nebulization New Bag/Given 06/10/17 2104)  albuterol (PROVENTIL,VENTOLIN) solution continuous neb (not administered)  doxycycline (VIBRA-TABS) tablet 100 mg (not administered)  albuterol (PROVENTIL) (2.5 MG/3ML)  0.083% nebulizer solution 5 mg (5 mg Nebulization Given 06/10/17 1706)  methylPREDNISolone sodium succinate (SOLU-MEDROL) 125 mg/2 mL injection 125 mg (125 mg Intravenous Given 06/10/17 2056)  magnesium sulfate IVPB 2 g 50 mL (0 g Intravenous Stopped 06/10/17 2157)  ipratropium (ATROVENT) nebulizer solution 0.5 mg (0.5 mg Nebulization Given 06/10/17 2104)    CHELSEY KIMBERLEY is 66 y.o. male presenting with respiratory distress and wheezing over the course of the last 3 days.  He has a productive cough however chest x-ray is clear, saturating well on room air however he is tripoding, diffuse wheezing.  He has a history of CHF (he never followed with cardiology after his last admission approximately 1 month ago) no peripheral edema, no history of DVT/PE.  His blood pressure is significantly elevated, he states has been compliant with his medication, I do not think this is a hypertensive urgency I think this is a response to his stress state.  Patient given hour-long nebulizer, Solu-Medrol and magnesium.  Will reassess.  Patient is slightly more comfortable on my exam after hour-long nebulizer however he continues to have significant wheezing, will need admission for status asthmaticus.    Final Clinical Impressions(s) / ED Diagnoses   Final diagnoses:  Asthma with status asthmaticus, unspecified asthma severity, unspecified whether persistent    ED Discharge Orders    None       Waynetta Pean 06/10/17 2324    Gareth Morgan, MD 06/14/17 1747

## 2017-06-10 NOTE — ED Triage Notes (Addendum)
Pt states he has felt SOB for 3 days and has been taking his breathing treatments. Hx of asthma. Pt states it has not improved. Pt visibly SOB; labored breathing. Pt also endorses some cp with hx of CHF

## 2017-06-10 NOTE — ED Notes (Signed)
Pt requesting breathing treatment.

## 2017-06-11 ENCOUNTER — Other Ambulatory Visit: Payer: Self-pay

## 2017-06-11 ENCOUNTER — Encounter (HOSPITAL_COMMUNITY): Payer: Self-pay | Admitting: Cardiology

## 2017-06-11 ENCOUNTER — Encounter (HOSPITAL_COMMUNITY): Payer: Self-pay | Admitting: *Deleted

## 2017-06-11 DIAGNOSIS — F1020 Alcohol dependence, uncomplicated: Secondary | ICD-10-CM | POA: Diagnosis present

## 2017-06-11 DIAGNOSIS — I5043 Acute on chronic combined systolic (congestive) and diastolic (congestive) heart failure: Secondary | ICD-10-CM | POA: Diagnosis present

## 2017-06-11 DIAGNOSIS — J4542 Moderate persistent asthma with status asthmaticus: Secondary | ICD-10-CM | POA: Diagnosis present

## 2017-06-11 DIAGNOSIS — I110 Hypertensive heart disease with heart failure: Secondary | ICD-10-CM | POA: Diagnosis present

## 2017-06-11 DIAGNOSIS — J45902 Unspecified asthma with status asthmaticus: Secondary | ICD-10-CM | POA: Diagnosis not present

## 2017-06-11 DIAGNOSIS — I493 Ventricular premature depolarization: Secondary | ICD-10-CM | POA: Diagnosis present

## 2017-06-11 DIAGNOSIS — I10 Essential (primary) hypertension: Secondary | ICD-10-CM

## 2017-06-11 DIAGNOSIS — I5042 Chronic combined systolic (congestive) and diastolic (congestive) heart failure: Secondary | ICD-10-CM

## 2017-06-11 DIAGNOSIS — R739 Hyperglycemia, unspecified: Secondary | ICD-10-CM | POA: Diagnosis not present

## 2017-06-11 DIAGNOSIS — F329 Major depressive disorder, single episode, unspecified: Secondary | ICD-10-CM | POA: Diagnosis present

## 2017-06-11 DIAGNOSIS — J45901 Unspecified asthma with (acute) exacerbation: Secondary | ICD-10-CM | POA: Diagnosis not present

## 2017-06-11 DIAGNOSIS — Z87891 Personal history of nicotine dependence: Secondary | ICD-10-CM | POA: Diagnosis not present

## 2017-06-11 DIAGNOSIS — J9621 Acute and chronic respiratory failure with hypoxia: Secondary | ICD-10-CM | POA: Diagnosis present

## 2017-06-11 DIAGNOSIS — I428 Other cardiomyopathies: Secondary | ICD-10-CM | POA: Diagnosis not present

## 2017-06-11 DIAGNOSIS — I429 Cardiomyopathy, unspecified: Secondary | ICD-10-CM | POA: Diagnosis present

## 2017-06-11 DIAGNOSIS — E785 Hyperlipidemia, unspecified: Secondary | ICD-10-CM | POA: Diagnosis present

## 2017-06-11 DIAGNOSIS — T380X5A Adverse effect of glucocorticoids and synthetic analogues, initial encounter: Secondary | ICD-10-CM | POA: Diagnosis not present

## 2017-06-11 LAB — CBC
HEMATOCRIT: 37.7 % — AB (ref 39.0–52.0)
HEMOGLOBIN: 12.3 g/dL — AB (ref 13.0–17.0)
MCH: 27.5 pg (ref 26.0–34.0)
MCHC: 32.6 g/dL (ref 30.0–36.0)
MCV: 84.3 fL (ref 78.0–100.0)
Platelets: 253 10*3/uL (ref 150–400)
RBC: 4.47 MIL/uL (ref 4.22–5.81)
RDW: 14.4 % (ref 11.5–15.5)
WBC: 4 10*3/uL (ref 4.0–10.5)

## 2017-06-11 LAB — BASIC METABOLIC PANEL
Anion gap: 11 (ref 5–15)
BUN: 12 mg/dL (ref 6–20)
CHLORIDE: 105 mmol/L (ref 101–111)
CO2: 20 mmol/L — AB (ref 22–32)
Calcium: 8.5 mg/dL — ABNORMAL LOW (ref 8.9–10.3)
Creatinine, Ser: 1.13 mg/dL (ref 0.61–1.24)
GFR calc Af Amer: >60 mL/min (ref >60)
GFR calc non Af Amer: >60 mL/min (ref >60)
GLUCOSE: 233 mg/dL — AB (ref 65–99)
POTASSIUM: 3.8 mmol/L (ref 3.5–5.1)
Sodium: 136 mmol/L (ref 135–145)

## 2017-06-11 LAB — BRAIN NATRIURETIC PEPTIDE: B NATRIURETIC PEPTIDE 5: 298.6 pg/mL — AB (ref 0.0–100.0)

## 2017-06-11 MED ORDER — ACETAMINOPHEN 325 MG PO TABS: 650.0000 mg | ORAL_TABLET | Freq: Four times a day (QID) | ORAL | Status: DC | PRN

## 2017-06-11 MED ORDER — IPRATROPIUM-ALBUTEROL 0.5-2.5 (3) MG/3ML IN SOLN
3.0000 mL | Freq: Four times a day (QID) | RESPIRATORY_TRACT | Status: DC
  Administered 2017-06-11 (×2): 3 mL via RESPIRATORY_TRACT
  Filled 2017-06-11 (×2): qty 3

## 2017-06-11 MED ORDER — FUROSEMIDE 10 MG/ML IJ SOLN
40.0000 mg | Freq: Once | INTRAMUSCULAR | Status: AC
  Administered 2017-06-11: 40 mg via INTRAVENOUS
  Filled 2017-06-11: qty 4

## 2017-06-11 MED ORDER — ONDANSETRON HCL 4 MG/2ML IJ SOLN: 4.0000 mg | Freq: Four times a day (QID) | INTRAMUSCULAR | Status: DC | PRN

## 2017-06-11 MED ORDER — METHYLPREDNISOLONE SODIUM SUCC 125 MG IJ SOLR
60.0000 mg | Freq: Two times a day (BID) | INTRAMUSCULAR | Status: DC
  Administered 2017-06-11 – 2017-06-12 (×2): 60 mg via INTRAVENOUS
  Filled 2017-06-11 (×3): qty 2

## 2017-06-11 MED ORDER — ACETAMINOPHEN 650 MG RE SUPP: 650.0000 mg | Freq: Four times a day (QID) | RECTAL | Status: DC | PRN

## 2017-06-11 MED ORDER — ONDANSETRON HCL 4 MG PO TABS: 4.0000 mg | ORAL_TABLET | Freq: Four times a day (QID) | ORAL | Status: DC | PRN

## 2017-06-11 MED ORDER — LISINOPRIL 10 MG PO TABS
10.0000 mg | ORAL_TABLET | Freq: Every day | ORAL | Status: DC
  Administered 2017-06-11: 10 mg via ORAL
  Filled 2017-06-11 (×2): qty 1

## 2017-06-11 MED ORDER — ALBUTEROL SULFATE (2.5 MG/3ML) 0.083% IN NEBU: 2.5000 mg | INHALATION_SOLUTION | RESPIRATORY_TRACT | Status: DC | PRN

## 2017-06-11 MED ORDER — DOCUSATE SODIUM 100 MG PO CAPS
100.0000 mg | ORAL_CAPSULE | Freq: Two times a day (BID) | ORAL | Status: DC
  Administered 2017-06-11 – 2017-06-15 (×9): 100 mg via ORAL
  Filled 2017-06-11 (×10): qty 1

## 2017-06-11 MED ORDER — SODIUM CHLORIDE 0.9 % IV SOLN
INTRAVENOUS | Status: DC
  Administered 2017-06-11: 04:00:00 via INTRAVENOUS

## 2017-06-11 MED ORDER — METHYLPREDNISOLONE SODIUM SUCC 125 MG IJ SOLR
80.0000 mg | Freq: Two times a day (BID) | INTRAMUSCULAR | Status: DC
  Administered 2017-06-11: 80 mg via INTRAVENOUS
  Filled 2017-06-11: qty 2

## 2017-06-11 MED ORDER — ENOXAPARIN SODIUM 40 MG/0.4ML ~~LOC~~ SOLN
40.0000 mg | Freq: Every day | SUBCUTANEOUS | Status: DC
  Administered 2017-06-11: 40 mg via SUBCUTANEOUS
  Filled 2017-06-11: qty 0.4

## 2017-06-11 MED ORDER — ALBUTEROL SULFATE (2.5 MG/3ML) 0.083% IN NEBU
2.5000 mg | INHALATION_SOLUTION | RESPIRATORY_TRACT | Status: DC | PRN
  Administered 2017-06-12: 2.5 mg via RESPIRATORY_TRACT
  Filled 2017-06-11: qty 3

## 2017-06-11 MED ORDER — DOXYCYCLINE HYCLATE 100 MG PO TABS
100.0000 mg | ORAL_TABLET | Freq: Two times a day (BID) | ORAL | Status: DC
  Administered 2017-06-11 – 2017-06-15 (×10): 100 mg via ORAL
  Filled 2017-06-11 (×10): qty 1

## 2017-06-11 MED ORDER — IPRATROPIUM-ALBUTEROL 0.5-2.5 (3) MG/3ML IN SOLN
3.0000 mL | Freq: Three times a day (TID) | RESPIRATORY_TRACT | Status: DC
  Administered 2017-06-11 – 2017-06-15 (×13): 3 mL via RESPIRATORY_TRACT
  Filled 2017-06-11 (×13): qty 3

## 2017-06-11 MED ORDER — HYDRALAZINE HCL 20 MG/ML IJ SOLN
5.0000 mg | INTRAMUSCULAR | Status: DC | PRN
  Administered 2017-06-11 (×2): 5 mg via INTRAVENOUS
  Filled 2017-06-11 (×2): qty 1

## 2017-06-11 NOTE — Progress Notes (Addendum)
PROGRESS NOTE    Joe Garcia  PPI:[id number] DOB: 09-01-1950 DOA: 06/10/2017 PCP: Medicine, [facility]  Brief Narrative:Joe Garcia is a 66 y.o. male with medical history significant of HTN, HLD, CHF (EF 30-35% with grade 1 diastolic dysfunction diagnosed in 10/18), and asthma presenting with SOB.  Patient has been sick with a head cold.  It started draining down into his chest.  Cold started about last Saturday.  +SOB.  +cough, productive of clear sputum.  +wheezing.  +shakes. CXR clear   Assessment & Plan:   Acute on chronic respiratory failure with hypoxia ([place]) -primarily due to Asthma exacerbation,  -also has new significant systolic CHF and could have underlying CAD, but appears euvolemic now -Continue IV solumedrol, will cut down dose -continue nebs and Doxy, CXR clear  Recent New Onset Systolic CHF -recently [facility] to [facility] last month and diagnosed with new onset systolic CHF EF 06-30% and advised to FU with Cardiology which hasnt happened -due to poor health literacy/resources etc will ask Cards to eval inpatient and consider ischemia eval if prudent -appears euvolemic, stop IVF -continue lisinopril  HTN -stable now, continue lisinopril  Asthma exacerbation -as above  Hyperglycemia, likely from steroids -monitor, check hba1c  Tobacco abuse -intermittent, counseled  DVT prophylaxis: lovenox Code Status: Full Code Family Communication: No family at bedside Disposition Plan: Home pending workup  Consultants:   Cards   Procedures:   Antimicrobials:  Doxy 11/14  Subjective: -breathing better, wheezing less  Objective: Vitals:   06/11/17 0230 06/11/17 0325 06/11/17 0516 06/11/17 0730  BP: 123/65 (!) 134/100 125/64   Pulse: 79 87 88   Resp: 15 18 16    Temp:  98.2 F (36.8 C) 98.3 F (36.8 C)   TempSrc:  Oral Oral   SpO2: 96% 100% 100% 96%  Weight:  70.2 kg (154 lb 12.2 oz)    Height:  5\' 11"  (1.803 m)       Intake/Output Summary (Last 24 hours) at 06/11/2017 1052 Last data filed at 06/11/2017 0646 Gross per 24 hour  Intake 276.67 ml  Output -  Net 276.67 ml   Filed Weights   06/10/17 1659 06/11/17 0325  Weight: 70.3 kg (155 lb) 70.2 kg (154 lb 12.2 oz)    Examination:  General exam: Appears calm and comfortable, thinly built, cachectic male Respiratory system: Rare exp wheezes Cardiovascular system: S1 & S2 heard, RRR Gastrointestinal system: Abdomen is nondistended, soft and nontender.Normal bowel sounds heard. Central nervous system: Alert and oriented. No focal neurological deficits. Extremities: Symmetric 5 x 5 power. Skin: No rashes, lesions or ulcers Psychiatry: Judgement and insight appear normal. Mood & affect appropriate.     Data Reviewed:   CBC: Recent Labs  Lab 06/10/17 1710 06/11/17 0608  WBC 8.8 4.0  HGB 13.6 12.3*  HCT 40.9 37.7*  MCV 84.7 84.3  PLT 282 160   Basic Metabolic Panel: Recent Labs  Lab 06/10/17 1710 06/11/17 0608  NA 136 136  K 3.5 3.8  CL 102 105  CO2 23 20*  GLUCOSE 88 233*  BUN 12 12  CREATININE 1.02 1.13  CALCIUM 9.0 8.5*   GFR: Estimated Creatinine Clearance: 65.6 mL/min (by C-G formula based on SCr of 1.13 mg/dL). Liver Function Tests: No results for input(s): AST, ALT, ALKPHOS, BILITOT, PROT, ALBUMIN in the last 168 hours. No results for input(s): LIPASE, AMYLASE in the last 168 hours. No results for input(s): AMMONIA in the last 168 hours. Coagulation Profile: No results  for input(s): INR, PROTIME in the last 168 hours. Cardiac Enzymes: No results for input(s): CKTOTAL, CKMB, CKMBINDEX, TROPONINI in the last 168 hours. BNP (last 3 results) No results for input(s): PROBNP in the last 8760 hours. HbA1C: No results for input(s): HGBA1C in the last 72 hours. CBG: No results for input(s): GLUCAP in the last 168 hours. Lipid Profile: No results for input(s): CHOL, HDL, LDLCALC, TRIG, CHOLHDL, LDLDIRECT in the last  72 hours. Thyroid Function Tests: No results for input(s): TSH, T4TOTAL, FREET4, T3FREE, THYROIDAB in the last 72 hours. Anemia Panel: No results for input(s): VITAMINB12, FOLATE, FERRITIN, TIBC, IRON, RETICCTPCT in the last 72 hours. Urine analysis:    Component Value Date/Time   COLORURINE YELLOW 07/16/2013 1404   APPEARANCEUR CLEAR 07/16/2013 1404   LABSPEC 1.017 07/16/2013 1404   PHURINE 6.5 07/16/2013 1404   GLUCOSEU NEGATIVE 07/16/2013 1404   HGBUR NEGATIVE 07/16/2013 1404   BILIRUBINUR NEGATIVE 07/16/2013 1404   KETONESUR NEGATIVE 07/16/2013 1404   PROTEINUR NEGATIVE 07/16/2013 1404   UROBILINOGEN 1.0 07/16/2013 1404   NITRITE NEGATIVE 07/16/2013 1404   LEUKOCYTESUR NEGATIVE 07/16/2013 1404   Sepsis Labs: @LABRCNTIP (procalcitonin:4,lacticidven:4)  )No results found for this or any previous visit (from the past 240 hour(s)).       Radiology Studies: Dg Chest 2 View  Result Date: 06/10/2017 CLINICAL DATA:  Shortness of breath 3 days.  History of asthma. EXAM: CHEST  2 VIEW COMPARISON:  05/04/2017 and 10/29/2016 . FINDINGS: Lungs are adequately inflated without focal consolidation or effusion. Cardiomediastinal silhouette is within normal. There is moderate degenerative change of the spine. Mild stable wedging of several adjacent midthoracic vertebral bodies IMPRESSION: No active cardiopulmonary disease. Electronically Signed   By: Marin Olp M.D.   On: 06/10/2017 17:44        Scheduled Meds: . docusate sodium  100 mg Oral BID  . doxycycline  100 mg Oral Q12H  . enoxaparin (LOVENOX) injection  40 mg Subcutaneous Daily  . ipratropium-albuterol  3 mL Nebulization TID  . lisinopril  10 mg Oral Daily  . methylPREDNISolone (SOLU-MEDROL) injection  80 mg Intravenous Q12H   Continuous Infusions: . sodium chloride 100 mL/hr at 06/11/17 0400     LOS: 0 days    Time spent: 48min    Domenic Polite, MD [facility] Page via [email or web address], password  [email or web address] After 7PM please contact night-coverage  06/11/2017, 10:52 AM

## 2017-06-11 NOTE — H&P (Signed)
History and Physical    Joe Garcia XBD:[id number] DOB: [birth year] DOA: 06/10/2017  PCP: Medicine, [facility] Consultants:  None - supposed to see a heart doctor but he lost the paper Patient coming from:  Home - lives with daughter and son; Joe ProseRoselyn Reef, [phone]  Chief Complaint: SOB  HPI: Joe Garcia is a 66 y.o. male with medical history significant of HTN, HLD, CHF (EF 30-35% with grade 1 diastolic dysfunction in 79/89), and asthma presenting with SOB.  Patient has been sick with a head cold.  It started draining down into his chest.  He was using nebs and it was working pretty well but it stopped working today and he couldn't do anything.  Cold started about last Saturday.  +SOB.  +cough, productive of clear sputum.  +wheezing.  +shakes one time but no known fever, improved after taking Advil.  Son was sick as well.    He was admitted from 10/8-10 for asthma exacerbation in COPD.  He was also diagnosed with HFrEF during that hospitalization and was scheduled to f/u with Dr. Gwenlyn Found but this did not happen.   ED Course: respiratory distress.  CXR negative but with diffuse wheezing, tripoding.  BP significantly elevated despite claim of compliance.  Given continuous neb x 2, solumedrol, magnesium.    Review of Systems: As per HPI; otherwise review of systems reviewed and negative.   Ambulatory Status:   Ambulates without assistance  Past Medical History:  Diagnosis Date  . Alcoholism ([place])   . Arthritis    hips, L shoulder  . Asthma   . CHF (congestive heart failure) ([place])   . Depression   . Family history of anesthesia complication   . Hyperlipidemia   . Hypertension     History reviewed. No pertinent surgical history.  Social History   Socioeconomic History  . Marital status: Single    Spouse name: Not on file  . Number of children: Not on file  . Years of education: Not on file  . Highest education level: Not on file  Social Needs  .  Financial resource strain: Not on file  . Food insecurity - worry: Not on file  . Food insecurity - inability: Not on file  . Transportation needs - medical: Not on file  . Transportation needs - non-medical: Not on file  Occupational History  . Occupation: "it don't work for me", on disability  Tobacco Use  . Smoking status: Former Smoker    Packs/day: 0.10    Years: 45.00    Pack years: 4.50    Last attempt to quit: 2012    Years since quitting: 6.8  . Smokeless tobacco: Never Used  Substance and Sexual Activity  . Alcohol use: No    Frequency: Never    Comment: Pt states no etoh since April 2014  . Drug use: No    Comment: Prior use of crack cocaine, quit 2012  . Sexual activity: No  Other Topics Concern  . Not on file  Social History Narrative  . Not on file    Allergies  Allergen Reactions  . Penicillins     REACTION: "stomach pains"    History reviewed. No pertinent family history.  Prior to Admission medications   Medication Sig Start Date End Date Taking? Authorizing Provider  albuterol (PROVENTIL HFA;VENTOLIN HFA) 108 (90 BASE) MCG/ACT inhaler Inhale 1-2 puffs into the lungs every 6 (six) hours as needed for wheezing or shortness of breath. 07/12/15  Yes  Barrett, Stevi, PA-C  albuterol (PROVENTIL) (2.5 MG/3ML) 0.083% nebulizer solution Take 3 mLs (2.5 mg total) by nebulization every 6 (six) hours as needed for wheezing or shortness of breath. 10/16/15  Yes Kirichenko, Tatyana, PA-C  lisinopril (PRINIVIL,ZESTRIL) 10 MG tablet Take 1 tablet (10 mg total) by mouth daily. 05/06/17 05/06/18 Yes Nedrud, Larena Glassman, MD  fluticasone (FLOVENT HFA) 110 MCG/ACT inhaler Inhale 2 puffs into the lungs 2 (two) times daily. Patient not taking: Reported on 06/10/2017 05/06/17 05/06/18  Thomasene Ripple, MD  naproxen (NAPROSYN) 500 MG tablet Take 1 tablet (500 mg total) by mouth 2 (two) times daily as needed for moderate pain. Patient not taking: Reported on 06/10/2017 05/18/16    Katy Apo, NP    Physical Exam: Vitals:   06/10/17 2245 06/10/17 2330 06/10/17 2350 06/11/17 0015  BP: (!) 157/73 (!) 142/68  136/69  Pulse: 89 81  83  Resp: (!) 26 14  15   Temp:      TempSrc:      SpO2: 96% 96% 96% 100%  Weight:      Height:         General:  Appears calm and comfortable and is NAD; in midst of continuous neb Eyes:  PERRL, EOMI, normal lids, iris ENT:  grossly normal hearing, lips & tongue, mmm Neck:  no LAD, masses or thyromegaly; no carotid bruits Cardiovascular:  RRR, no m/r/g. No LE edema.  Respiratory:   CTA bilaterally with no wheezes/rales/rhonchi.  Normal respiratory effort. Abdomen:  soft, NT, ND, NABS Back:   normal alignment, no CVAT Skin:  no rash or induration seen on limited exam Musculoskeletal:  grossly normal tone BUE/BLE, good ROM, no bony abnormality Psychiatric:  grossly normal mood and affect, speech fluent and appropriate, AOx3 Neurologic:  CN 2-12 grossly intact, moves all extremities in coordinated fashion, sensation intact    Radiological Exams on Admission: Dg Chest 2 View  Result Date: 06/10/2017 CLINICAL DATA:  Shortness of breath 3 days.  History of asthma. EXAM: CHEST  2 VIEW COMPARISON:  05/04/2017 and 10/29/2016 . FINDINGS: Lungs are adequately inflated without focal consolidation or effusion. Cardiomediastinal silhouette is within normal. There is moderate degenerative change of the spine. Mild stable wedging of several adjacent midthoracic vertebral bodies IMPRESSION: No active cardiopulmonary disease. Electronically Signed   By: Marin Olp M.D.   On: 06/10/2017 17:44    EKG: Independently reviewed.  Sinus tachycardia with rate 118; PVCs, LVH, nonspecific ST changes with no evidence of acute ischemia   Labs on Admission: I have personally reviewed the available labs and imaging studies at the time of the admission.  Pertinent labs:   BNP 298.8, prior 509.8 on 10/8 Troponin 0.00 WBC  8.8  Assessment/Plan Principal Problem:   Acute on chronic respiratory failure with hypoxia (H[facility]) Active Problems:   Essential hypertension   Asthma exacerbation   Chronic combined systolic (congestive) and diastolic (congestive) heart failure ([facility])   Acute on chronic respiratory failure, likely associated with asthma exacerbation -He does report h/o asthma but was admitted for similar presentation 1 month ago and required BIPAP at that time -He does have a h/o tobacco abuse, but he reports fairly minimal use (but long-term) -CXR negative for PNA; no other apparent viral infection; normal WBC. -will observe patient overnight  -Nebulizers: Duonebs q6h and prn albuterol  -Solu-Medrol 80 mg IV BID -Continue doxycycline due to report of URI and possible concern for COPD contributing -Received magnesium in the ER as well as continuous neb  Chronic combined heart failure  -Appears to be compensated at this time -Will need outpatient cardiology f/u as previously scheduled -Continue Lisinopril   HTN -Markedly elevated BPs in the ER -Continue Lisinopril -Add prn IV hydralazine     DVT prophylaxis: Lovenox  Code Status:  Full - confirmed with patient Family Communication: None present Disposition Plan: Home once clinically improved Consults called: None  Admission status: It is my clinical opinion that referral for OBSERVATION is reasonable and necessary in this patient based on the above information provided. The aforementioned taken together are felt to place the patient at high risk for further clinical deterioration. However it is anticipated that the patient may be medically stable for discharge from the hospital within 24 to 48 hours.   Karmen Bongo MD [facility]  If note is complete, please contact covering daytime or nighttime physician. [email or web address] Password [email or web address]  06/11/2017, 1:25 AM

## 2017-06-11 NOTE — Evaluation (Signed)
Physical Therapy Evaluation Patient Details Name: Joe Garcia MRN: [id number] DOB: 1950/08/14 Today's Date: 06/11/2017   History of Present Illness  Joe Garcia is a 66 y.o. male with medical history significant of HTN, HLD, CHF (EF 30-35% with grade 1 diastolic dysfunction in 66/44), and asthma presenting with SOBPt dx with acute on chronic respiratory failure with hypoxia, recent new onset of systolic CHF.  Cardiology has been consulted and wants to do a cardiac cath on him (06/12/17?).    Clinical Impression  Pt was able to walk the entire unit pushing the IV pole with 2/4 DOE and O2 sats 99-100% on RA.  He is very stiff from being in the bed and has some bad hip arthritis at baseline.  He would benefit from working with the mobility tech as well as PT to ensure he doesn't have a physical decline while staying in the hospital.  Next time PT to bring a cane to see if he is safe with just the cane (as he was leaning on the IV pole during gait today).   PT to follow acutely for deficits listed below.     Follow Up Recommendations No PT follow up    Equipment Recommendations  None recommended by PT    Recommendations for Other Services    NA    Precautions / Restrictions Precautions Precautions: Other (comment) Precaution Comments: monitor breathing.  Restrictions Weight Bearing Restrictions: No      Mobility  Bed Mobility Overal bed mobility: Independent                Transfers Overall transfer level: Needs assistance Equipment used: None Transfers: Sit to/from Stand Sit to Stand: Supervision         General transfer comment: supervision for safety  Ambulation/Gait Ambulation/Gait assistance: Supervision Ambulation Distance (Feet): 510 Feet Assistive device: (IV pole) Gait Pattern/deviations: Step-through pattern;Antalgic Gait velocity: decreased Gait velocity interpretation: Below normal speed for age/gender General Gait Details: Moderately  antalgic gait pattern favoring his right leg.  Pt reports he has "real bad arthritis" in that hip and being in the bed here in the hospital has made it stiff.  DOE 2/4 with gait and O2 sats 99-100% on RA during gait.  Pt initially had quite a bit of coughing when we first started walking which improved with increased gait distance.  Pt reported he felt much better after walking.  I asked RN to refer him for mobility tech so that he can walk more often.   Stairs Stairs: Yes Stairs assistance: Supervision Stair Management: One rail Left;Step to pattern;Forwards Number of Stairs: 3(limited by IV line) General stair comments: Pt did very well with step to pattern and the rail leading up with his stronger less arthritic leg. Supervision for safety and line management.          Balance Overall balance assessment: Needs assistance Sitting-balance support: Feet supported;No upper extremity supported Sitting balance-Leahy Scale: Good     Standing balance support: No upper extremity supported Standing balance-Leahy Scale: Fair Standing balance comment: benefits from one hand stability                             Pertinent Vitals/Pain Pain Assessment: Faces Faces Pain Scale: Hurts little more Pain Location: chronic right hip pain Pain Descriptors / Indicators: Aching Pain Intervention(s): Limited activity within patient's tolerance;Monitored during session;Repositioned    Home Living Family/patient expects to be discharged to:: Private residence  Living Arrangements: Children Available Help at Discharge: Available PRN/intermittently Type of Home: House Home Access: Stairs to enter Entrance Stairs-Rails: Left Entrance Stairs-Number of Steps: 4 Home Layout: One level Home Equipment: Grab bars - tub/shower Additional Comments: pt reports that he had no problems stepping in and out of shower or with SOB during ADLs PTA    Prior Function Level of Independence: Independent with  assistive device(s)         Comments: often uses a cane or RW for gait depending on how bad his arthritic hip is that day.      Hand Dominance   Dominant Hand: Right    Extremity/Trunk Assessment   Upper Extremity Assessment Upper Extremity Assessment: Defer to OT evaluation    Lower Extremity Assessment Lower Extremity Assessment: RLE deficits/detail;LLE deficits/detail RLE Deficits / Details: right hip with arthritic pain and stiffness, pt favors this leg during gait and walks with a significant limp LLE Deficits / Details: generally weak with noticeable muscle wasting.  Nutrition consult in place.     Cervical / Trunk Assessment Cervical / Trunk Assessment: Normal  Communication   Communication: No difficulties  Cognition Arousal/Alertness: Awake/alert Behavior During Therapy: WFL for tasks assessed/performed Overall Cognitive Status: Within Functional Limits for tasks assessed                                               Assessment/Plan    PT Assessment Patient needs continued PT services  PT Problem List Decreased strength;Decreased range of motion;Decreased activity tolerance;Decreased balance;Decreased mobility;Decreased knowledge of use of DME;Cardiopulmonary status limiting activity;Pain       PT Treatment Interventions DME instruction;Gait training;Stair training;Functional mobility training;Therapeutic activities;Balance training;Therapeutic exercise;Patient/family education;Modalities    PT Goals (Current goals can be found in the Care Plan section)  Acute Rehab PT Goals Patient Stated Goal: to get his breathing better and make sure his heart is ok PT Goal Formulation: With patient Time For Goal Achievement: 06/25/17 Potential to Achieve Goals: Good    Frequency Min 3X/week           AM-PAC PT "6 Clicks" Daily Activity  Outcome Measure Difficulty turning over in bed (including adjusting bedclothes, sheets and blankets)?:  None Difficulty moving from lying on back to sitting on the side of the bed? : None Difficulty sitting down on and standing up from a chair with arms (e.g., wheelchair, bedside commode, etc,.)?: None Help needed moving to and from a bed to chair (including a wheelchair)?: None Help needed walking in hospital room?: None Help needed climbing 3-5 steps with a railing? : None 6 Click Score: 24    End of Session Equipment Utilized During Treatment: Gait belt Activity Tolerance: Patient tolerated treatment well Patient left: in chair;with call bell/phone within reach Nurse Communication: Mobility status PT Visit Diagnosis: Muscle weakness (generalized) (M62.81);Pain;Other (comment)(decreased activity tolerance. ) Pain - Right/Left: Right Pain - part of body: Hip    Time: 6213-0865 PT Time Calculation (min) (ACUTE ONLY): 16 min   Charges:        2017-06-24 1600  PT G-Codes **NOT FOR INPATIENT CLASS**  Functional Assessment Tool Used Clinical judgement  Functional Limitation Mobility: Walking and moving around  Mobility: Walking and Moving Around Current Status (H8469) CJ  Mobility: Walking and Moving Around Goal Status (G2952) CI     Wells Guiles B. [place], [place], DPT [phone]  PT Evaluation $PT Eval Moderate Complexity: 1 Mod     06/11/2017, 5:09 PM

## 2017-06-11 NOTE — Evaluation (Signed)
Occupational Therapy Evaluation Patient Details Name: Joe Garcia MRN: [id number] DOB: [birth year] Today's Date: 06/11/2017    History of Present Illness LESTER CRICKENBERGER is a 66 y.o. male with medical history significant of HTN, HLD, CHF (EF 30-35% with grade 1 diastolic dysfunction in 56/31), and asthma presenting with SOB   Clinical Impression   Pt is at sup level with ADLs and min guard A with  ADL mobility. 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 Pt did not demo ant SOB thoroughout OT session with ambulation to bathroom or simulated ADLs, toilet and shower transfers. O2 SATs remained 92-99%.     Follow Up Recommendations  No OT follow up;Supervision - Intermittent    Equipment Recommendations  None recommended by OT    Recommendations for Other Services       Precautions / Restrictions Precautions Precautions: None Restrictions Weight Bearing Restrictions: No      Mobility Bed Mobility Overal bed mobility: Independent                Transfers Overall transfer level: min guard A - sup Equipment used: None             General transfer comment: sup - min guard A, slow pace, pt reports that he moves slower to get up due to arthritis in N hips    Balance Overall balance assessment: Needs assistance Sitting-balance support: No upper extremity supported;Feet supported Sitting balance-Leahy Scale: Good     Standing balance support: During functional activity Standing balance-Leahy Scale: Fair Standing balance comment: slow pace sit - stand, stiffness in hips                           ADL either performed or assessed with clinical judgement   ADL Overall ADL's : sup - min guard A                                       General ADL Comments: supervison, moving at slower pace, no SOB during simulated ADL and toileting tasks, O2 SATs 92-99% throughout session     Vision Baseline  Vision/History: Wears glasses Wears Glasses: Reading only Patient Visual Report: No change from baseline       Perception     Praxis      Pertinent Vitals/Pain Pain Assessment: No/denies pain     Hand Dominance Right   Extremity/Trunk Assessment Upper Extremity Assessment Upper Extremity Assessment: Overall WFL for tasks assessed   Lower Extremity Assessment Lower Extremity Assessment: Defer to PT evaluation   Cervical / Trunk Assessment Cervical / Trunk Assessment: Normal   Communication Communication Communication: No difficulties   Cognition Arousal/Alertness: Awake/alert Behavior During Therapy: WFL for tasks assessed/performed Overall Cognitive Status: Within Functional Limits for tasks assessed                                     General Comments   pt pleasant and cooperative    Exercises     Shoulder Instructions      Home Living Family/patient expects to be discharged to:: Private residence Living Arrangements: Children Available Help at Discharge: Available PRN/intermittently Type of Home: House Home Access: Stairs to enter [employer] of Steps: 4 Entrance Stairs-Rails: Left Home Layout:  One level     Bathroom Shower/Tub: Teacher, early years/pre: Standard     Home Equipment: Grab bars - tub/shower   Additional Comments: pt reports that he had no problems stepping in and out of shower or with SOB during ADLs PTA      Prior Functioning/Environment Level of Independence: Independent                 OT Problem List: Decreased activity tolerance;Decreased knowledge of use of DME or AE;Impaired balance (sitting and/or standing)      OT Treatment/Interventions: Patient/family education;Energy conservation;Self-care/ADL training;Therapeutic activities    OT Goals(Current goals can be found in the care plan section) Acute Rehab OT Goals Patient Stated Goal: go home OT Goal Formulation: With  patient Time For Goal Achievement: 06/11/17 Potential to Achieve Goals: Good ADL Goals Additional ADL Goal #1: pt will verbalize and demo 3-5 energy conservation techniques for ADLs and ADL mobility  OT Frequency: Min 2X/week   Barriers to D/C:    no barriers       Co-evaluation              AM-PAC PT "6 Clicks" Daily Activity     Outcome Measure Help from another person eating meals?: None Help from another person taking care of personal grooming?: None Help from another person toileting, which includes using toliet, bedpan, or urinal?: None Help from another person bathing (including washing, rinsing, drying)?: A Little Help from another person to put on and taking off regular upper body clothing?: None Help from another person to put on and taking off regular lower body clothing?: A Little 6 Click Score: 18   End of Session    Activity Tolerance: Patient tolerated treatment well Patient left: in bed;with call bell/phone within reach  OT Visit Diagnosis: Muscle weakness (generalized) (M62.81);Other (comment)(SOB)                Time: 5974-1638 OT Time Calculation (min): 39 min Charges:  OT General Charges $OT Visit: 1 Visit OT Evaluation $OT Eval Low Complexity: 1 Low OT Treatments $Therapeutic Activity: 8-22 mins G-Codes: OT G-codes **NOT FOR INPATIENT CLASS** Functional Assessment Tool Used: AM-PAC 6 Clicks Daily Activity Functional Limitation: Other OT primary Other OT Primary Current Status (G5364): At least 1 percent but less than 20 percent impaired, limited or restricted Other OT Primary Goal Status (W8032): At least 1 percent but less than 20 percent impaired, limited or restricted     Britt Bottom 06/11/2017, 2:07 PM

## 2017-06-11 NOTE — Consult Note (Signed)
Cardiology Consultation:   Patient ID: Joe Garcia; [id number]; 1951/04/13   Admit date: 06/10/2017 Date of Consult: 06/11/2017  Primary Care Provider: Medicine, [facility] Primary Cardiologist: New    Patient Profile:   Joe Garcia is a 66 y.o. male with a hx of HTN, HLD, Asthma and recent diagnosis of systolic HF in October, who presented to hospital with acute on chronic respiratory failure. Cardiology consulted given new systolic HF, at the request of Dr Broadus John, Internal [facility].  History of Present Illness:   Joe Garcia was recently admitted last month from 05/04/17-05/06/17 for asthma exacerbation and HTN. He was admitted by IM and treated. As part of his w/u a 2D echo was obtained which showed reduced LVEF at 30-35% with G1DD. He was noted to be well compensated. No inpatient consultation placed. Plan was for him to f/u with cardiology as an outpatient, but no appt made.    He presented back to the ED yesterday with recurrent dyspnea/ upper respiratory symptoms w/ productive cough, nasal drainage and wheezing. Gradually worsening. Initially he had good response with his inhalers, however yesterday he had dyspnea unrelieved with asthma treatment.   In ED, he was noted to be in respiratory distress. CXR negative. BP was elevated. He was given continuous neb x 2, solumedrol and magnesium. He has been admitted by IM. Cardiology consulted given newly recognized cardiomyopathy and CHF. BNP on admit was 298.6. TOC troponin negative x 1. BMP WNL. Initial EKG showed sinus tach 118 with occasional PVCs. No diuretics given.   He is comfortable at rest but notes that he still gets SOB with activity. No chest pain currently. He notes h/o SSCP/ indigestion if he eats too fast, which he contributes to reflux. He denies any CP with exertion. He also denies any h/o DM or HLD. He is a former smoker and quit 6 years ago. Also prior h/o alcohol use but quit 6 years ago. He also  notes family h/o heart disease. Details limited (2 daughters who have "fluid around their heart", father w/ the same, no h/o MI/SCD in family that he is aware of).  Pt notes he had a cardiac cath may years ago here at [facility], however I do not see any records in chart.    Past Medical History:  Diagnosis Date  . Alcoholism ([place])   . Arthritis    hips, L shoulder  . Asthma   . CHF (congestive heart failure) ([place])   . Depression   . Family history of anesthesia complication   . Hyperlipidemia   . Hypertension     History reviewed. No pertinent surgical history.   Home Medications:  Prior to Admission medications   Medication Sig Start Date End Date Taking? Authorizing Provider  albuterol (PROVENTIL HFA;VENTOLIN HFA) 108 (90 BASE) MCG/ACT inhaler Inhale 1-2 puffs into the lungs every 6 (six) hours as needed for wheezing or shortness of breath. 07/12/15  Yes Barrett, Lahoma Crocker, PA-C  albuterol (PROVENTIL) (2.5 MG/3ML) 0.083% nebulizer solution Take 3 mLs (2.5 mg total) by nebulization every 6 (six) hours as needed for wheezing or shortness of breath. 10/16/15  Yes Kirichenko, Tatyana, PA-C  lisinopril (PRINIVIL,ZESTRIL) 10 MG tablet Take 1 tablet (10 mg total) by mouth daily. 05/06/17 05/06/18 Yes NedrudLarena Glassman, MD    Inpatient Medications: Scheduled Meds: . docusate sodium  100 mg Oral BID  . doxycycline  100 mg Oral Q12H  . enoxaparin (LOVENOX) injection  40 mg Subcutaneous Daily  . ipratropium-albuterol  3  mL Nebulization TID  . lisinopril  10 mg Oral Daily  . methylPREDNISolone (SOLU-MEDROL) injection  60 mg Intravenous Q12H   Continuous Infusions:  PRN Meds: acetaminophen **OR** acetaminophen, albuterol, hydrALAZINE, ondansetron **OR** ondansetron (ZOFRAN) IV  Allergies:    Allergies  Allergen Reactions  . Penicillins     REACTION: "stomach pains"    Social History:   Social History   Socioeconomic History  . Marital status: Single    Spouse name: Not on file  .  Number of children: Not on file  . Years of education: Not on file  . Highest education level: Not on file  Social Needs  . Financial resource strain: Not on file  . Food insecurity - worry: Not on file  . Food insecurity - inability: Not on file  . Transportation needs - medical: Not on file  . Transportation needs - non-medical: Not on file  Occupational History  . Occupation: "it don't work for me", on disability  Tobacco Use  . Smoking status: Former Smoker    Packs/day: 0.10    Years: 45.00    Pack years: 4.50    Last attempt to quit: 2012    Years since quitting: 6.8  . Smokeless tobacco: Never Used  Substance and Sexual Activity  . Alcohol use: No    Frequency: Never    Comment: Pt states no etoh since April 2014  . Drug use: No    Comment: Prior use of crack cocaine, quit 2012  . Sexual activity: No  Other Topics Concern  . Not on file  Social History Narrative  . Not on file    Family History:    Family History  Problem Relation Age of Onset  . Heart disease Father   . Heart disease Daughter        "fluid around heart"      ROS:  Please see the history of present illness.  ROS  All other ROS reviewed and negative.     Physical Exam/Data:   Vitals:   06/11/17 0230 06/11/17 0325 06/11/17 0516 06/11/17 0730  BP: 123/65 (!) 134/100 125/64   Pulse: 79 87 88   Resp: 15 18 16    Temp:  98.2 F (36.8 C) 98.3 F (36.8 C)   TempSrc:  Oral Oral   SpO2: 96% 100% 100% 96%  Weight:  154 lb 12.2 oz (70.2 kg)    Height:  5\' 11"  (1.803 m)      Intake/Output Summary (Last 24 hours) at 06/11/2017 1405 Last data filed at 06/11/2017 0646 Gross per 24 hour  Intake 276.67 ml  Output -  Net 276.67 ml   Filed Weights   06/10/17 1659 06/11/17 0325  Weight: 155 lb (70.3 kg) 154 lb 12.2 oz (70.2 kg)   Body mass index is 21.59 kg/m.  General:  Well nourished, well developed, in no acute distress, thin appearing  HEENT: normal Lymph: no adenopathy Neck: no  JVD Endocrine:  No thryomegaly Vascular: No carotid bruits; FA pulses 2+ bilaterally without bruits  Cardiac:  normal S1, S2; RRR; no murmur Lungs:  Diffuse bilateral wheezing and rhonchi, no rales Abd: soft, nontender, no hepatomegaly  Ext: no edema Musculoskeletal:  No deformities, BUE and BLE strength normal and equal Skin: warm and dry  Neuro:  CNs 2-12 intact, no focal abnormalities noted Psych:  Normal affect   EKG:  The EKG was personally reviewed and demonstrates:  Sinus tach 118 PVC Telemetry:  Telemetry was personally reviewed and demonstrates:  Not currently on telemetry. Order has been placed.   Relevant CV Studies: 2D Echo 05/06/17  Study Conclusions  - Left ventricle: The cavity size was moderately dilated. Wall   thickness was normal. Systolic function was moderately to   severely reduced. The estimated ejection fraction was in the   range of 30% to 35%. Diffuse hypokinesis. Doppler parameters are   consistent with abnormal left ventricular relaxation (grade 1   diastolic dysfunction). - Ventricular septum: Septal motion showed abnormal function and   dyssynergy. - Mitral valve: Mildly thickened leaflets . There was mild   regurgitation. - Right atrium: Central venous pressure (est): 3 mm Hg. - Atrial septum: No defect or patent foramen ovale was identified. - Tricuspid valve: There was trivial regurgitation. - Pulmonary arteries: PA peak pressure: 36 mm Hg (S). - Pericardium, extracardiac: There was no pericardial effusion.  Impressions:  - Normal LV wall thickness with moderate chamber dilatation and   LVEF approximately 30-35%. There is diffuse hypokinesis with   regional variation and septal dyssynergy. Grade 1 diastolic   dysfunction. Mildly thickened mitral leaflets with mild mitral   regurgitation. Trivial tricuspid regurgitation with PASP   estimated 36 mmHg.   Laboratory Data:  Chemistry Recent Labs  Lab 06/10/17 1710 06/11/17 0608  NA  136 136  K 3.5 3.8  CL 102 105  CO2 23 20*  GLUCOSE 88 233*  BUN 12 12  CREATININE 1.02 1.13  CALCIUM 9.0 8.5*  GFRNONAA >60 >60  GFRAA >60 >60  ANIONGAP 11 11    No results for input(s): PROT, ALBUMIN, AST, ALT, ALKPHOS, BILITOT in the last 168 hours. Hematology Recent Labs  Lab 06/10/17 1710 06/11/17 0608  WBC 8.8 4.0  RBC 4.83 4.47  HGB 13.6 12.3*  HCT 40.9 37.7*  MCV 84.7 84.3  MCH 28.2 27.5  MCHC 33.3 32.6  RDW 14.5 14.4  PLT 282 253   Cardiac EnzymesNo results for input(s): TROPONINI in the last 168 hours.  Recent Labs  Lab 06/10/17 1721  TROPIPOC 0.00    BNP Recent Labs  Lab 06/10/17 1710  BNP 298.6*    DDimer No results for input(s): DDIMER in the last 168 hours.  Radiology/Studies:  Dg Chest 2 View  Result Date: 06/10/2017 CLINICAL DATA:  Shortness of breath 3 days.  History of asthma. EXAM: CHEST  2 VIEW COMPARISON:  05/04/2017 and 10/29/2016 . FINDINGS: Lungs are adequately inflated without focal consolidation or effusion. Cardiomediastinal silhouette is within normal. There is moderate degenerative change of the spine. Mild stable wedging of several adjacent midthoracic vertebral bodies IMPRESSION: No active cardiopulmonary disease. Electronically Signed   By: Marin Olp M.D.   On: 06/10/2017 17:44    Assessment and Plan:   1. Cardiomyopathy: newly recognized. Echo previous admit for acute asthama exacerbation 04/2017 showed reduced EF down to 30-35% with Grade 1DD, diffuse hypokinesis with regional variation and septal dyssynergy. No prior echocardiograms for comparison. He also has a h/o HTN and former smoker (quit 6 years ago). Recommend LHC to r/o underlying CAD and to make the diagnosis of ischemic vs nonischemic cardiomyopathy. Also recommend guidelines directed medical therapy for systolic HF. He is already on ACE-I therapy with Lisinopril 10 mg. Can further titrate to 20 mg. BB therapy is also indicated in systolic HF, however treatment w/  this class may be limited given his asthma. Can try cardioselective metoprolol succinate, low dose, to see how he tolerates. Given his HTN, we can also try adding hydralazine, nitrates and  spironolactone at some point. He will need repeat echo in ~3 months after treatment with HF drugs. If EF not improved >35%, he will need referral to EP for consideration for ICD.  Would also advise that pt be monitored on telemetry while inpatient given is low EF to monitor for ventricular arrhthymias. Given elevated BNP at 298 and persistent wheezing and rhonchi on lung exam, recommend trial of IV Lasix. Try 40 mg IV x 1 and monitor response.   MD to follow with further recommendations.   For questions or updates, please contact [place] Please consult [email or web address] for contact info under Cardiology/STEMI.   Signed, Lyda Jester, PA-C  06/11/2017 2:05 PM

## 2017-06-11 NOTE — Progress Notes (Signed)
Nutrition Brief Note  Patient identified on the Malnutrition Screening Tool (MST) Report and consulted per COPD GOLD protocol.   Wt Readings from Last 15 Encounters:  06/11/17 154 lb 12.2 oz (70.2 kg)  05/04/17 160 lb (72.6 kg)  07/17/13 160 lb (72.6 kg)  07/03/13 148 lb (67.1 kg)  10/29/12 148 lb (67.1 kg)   Joe Garcia is a 66 y.o. male with medical history significant of HTN, HLD, CHF (EF 30-35% with grade 1 diastolic dysfunction in 07/68), and asthma presenting with SOB.  Pt admitted with acute respiratory failure with hypoxia.  Spoke with pt at bedside, who reports he always has a heart appetite. He typically consumes 2 meals per day (meat, starch, and vegetable). He ate 100% of breakfast.   Pt denies any weight loss. He shares UBW is around 150#.   Nutrition-Focused physical exam completed. Findings are no fat depletion, no muscle depletion, and no edema.   Body mass index is 21.59 kg/m. Patient meets criteria for normal weight range based on current BMI.   Current diet order is regular, patient is consuming approximately 100% of meals at this time. Labs and medications reviewed.   No nutrition interventions warranted at this time. If nutrition issues arise, please consult RD.   Kathye Cipriani A. Jimmye Norman, RD, LDN, CDE Pager: [phone] After hours Pager: [phone]

## 2017-06-11 NOTE — Progress Notes (Signed)
Responded to spiritual care consult to support patient.  Patient sitting up very alert and smiling.  Patient expressed he had no immediate needs but enjoyed the visit and time together.  Chaplain available as needed.  Jaclynn Major, [place], [facility], Pager [phone]

## 2017-06-12 ENCOUNTER — Encounter (HOSPITAL_COMMUNITY)
Admission: EM | Disposition: A | Discharge status: Home / Self Care | Payer: Self-pay | Source: Home / Self Care | Attending: Internal Medicine

## 2017-06-12 ENCOUNTER — Encounter (HOSPITAL_COMMUNITY): Payer: Self-pay | Admitting: Internal Medicine

## 2017-06-12 ENCOUNTER — Ambulatory Visit (HOSPITAL_COMMUNITY): Admission: RE | Admit: 2017-06-12 | Payer: Medicaid Other | Source: Ambulatory Visit | Admitting: Cardiovascular Disease

## 2017-06-12 DIAGNOSIS — J45902 Unspecified asthma with status asthmaticus: Secondary | ICD-10-CM

## 2017-06-12 HISTORY — PX: RIGHT/LEFT HEART CATH AND CORONARY ANGIOGRAPHY: CATH118266

## 2017-06-12 LAB — POCT I-STAT 3, VENOUS BLOOD GAS (G3P V)
Acid-base deficit: 2 mmol/L (ref 0.0–2.0)
Acid-base deficit: 5 mmol/L — ABNORMAL HIGH (ref 0.0–2.0)
BICARBONATE: 20.8 mmol/L (ref 20.0–28.0)
Bicarbonate: 23.6 mmol/L (ref 20.0–28.0)
Bicarbonate: 24.9 mmol/L (ref 20.0–28.0)
O2 SAT: 69 %
O2 SAT: 81 %
O2 Saturation: 73 %
PCO2 VEN: 38.9 mmHg — AB (ref 44.0–60.0)
PCO2 VEN: 42.3 mmHg — AB (ref 44.0–60.0)
PH VEN: 7.355 (ref 7.250–7.430)
PO2 VEN: 40 mmHg (ref 32.0–45.0)
PO2 VEN: 47 mmHg — AB (ref 32.0–45.0)
TCO2: 22 mmol/L (ref 22–32)
TCO2: 25 mmol/L (ref 22–32)
TCO2: 26 mmol/L (ref 22–32)
pCO2, Ven: 42.2 mmHg — ABNORMAL LOW (ref 44.0–60.0)
pH, Ven: 7.336 (ref 7.250–7.430)
pH, Ven: 7.379 (ref 7.250–7.430)
pO2, Ven: 38 mmHg (ref 32.0–45.0)

## 2017-06-12 LAB — CBC
HCT: 37.4 % — ABNORMAL LOW (ref 39.0–52.0)
HEMOGLOBIN: 12.3 g/dL — AB (ref 13.0–17.0)
MCH: 27.8 pg (ref 26.0–34.0)
MCHC: 32.9 g/dL (ref 30.0–36.0)
MCV: 84.6 fL (ref 78.0–100.0)
PLATELETS: 247 10*3/uL (ref 150–400)
RBC: 4.42 MIL/uL (ref 4.22–5.81)
RDW: 15 % (ref 11.5–15.5)
WBC: 7.5 10*3/uL (ref 4.0–10.5)

## 2017-06-12 LAB — BASIC METABOLIC PANEL
Anion gap: 8 (ref 5–15)
BUN: 16 mg/dL (ref 6–20)
CHLORIDE: 108 mmol/L (ref 101–111)
CO2: 22 mmol/L (ref 22–32)
Calcium: 9.1 mg/dL (ref 8.9–10.3)
Creatinine, Ser: 1.15 mg/dL (ref 0.61–1.24)
GFR calc Af Amer: >60 mL/min (ref >60)
GFR calc non Af Amer: >60 mL/min (ref >60)
GLUCOSE: 156 mg/dL — AB (ref 65–99)
POTASSIUM: 4.1 mmol/L (ref 3.5–5.1)
Sodium: 138 mmol/L (ref 135–145)

## 2017-06-12 LAB — POCT I-STAT 3, ART BLOOD GAS (G3+)
BICARBONATE: 25.2 mmol/L (ref 20.0–28.0)
O2 SAT: 99 %
TCO2: 26 mmol/L (ref 22–32)
pCO2 arterial: 40.9 mmHg (ref 32.0–48.0)
pH, Arterial: 7.397 (ref 7.350–7.450)
pO2, Arterial: 157 mmHg — ABNORMAL HIGH (ref 83.0–108.0)

## 2017-06-12 LAB — PROTIME-INR
INR: 1
PROTHROMBIN TIME: 13.1 s (ref 11.4–15.2)

## 2017-06-12 SURGERY — RIGHT/LEFT HEART CATH AND CORONARY ANGIOGRAPHY
Anesthesia: LOCAL

## 2017-06-12 MED ORDER — SODIUM CHLORIDE 0.9 % IV SOLN: INTRAVENOUS | Status: DC

## 2017-06-12 MED ORDER — HEPARIN SODIUM (PORCINE) 1000 UNIT/ML IJ SOLN
INTRAMUSCULAR | Status: AC
  Filled 2017-06-12: qty 1

## 2017-06-12 MED ORDER — LISINOPRIL 20 MG PO TABS
20.0000 mg | ORAL_TABLET | Freq: Every day | ORAL | Status: DC
  Administered 2017-06-12 – 2017-06-13 (×2): 20 mg via ORAL
  Filled 2017-06-12: qty 1

## 2017-06-12 MED ORDER — VERAPAMIL HCL 2.5 MG/ML IV SOLN
INTRAVENOUS | Status: AC
  Filled 2017-06-12: qty 2

## 2017-06-12 MED ORDER — MIDAZOLAM HCL 2 MG/2ML IJ SOLN
INTRAMUSCULAR | Status: AC
  Filled 2017-06-12: qty 2

## 2017-06-12 MED ORDER — SODIUM CHLORIDE 0.9 % IV SOLN: 250.0000 mL | INTRAVENOUS | Status: DC | PRN

## 2017-06-12 MED ORDER — ASPIRIN 81 MG PO CHEW: 81.0000 mg | CHEWABLE_TABLET | ORAL | Status: DC

## 2017-06-12 MED ORDER — HEPARIN SODIUM (PORCINE) 1000 UNIT/ML IJ SOLN
INTRAMUSCULAR | Status: DC | PRN
  Administered 2017-06-12: 3500 [IU] via INTRAVENOUS

## 2017-06-12 MED ORDER — VERAPAMIL HCL 2.5 MG/ML IV SOLN
INTRAVENOUS | Status: DC | PRN
  Administered 2017-06-12: 10 mL via INTRA_ARTERIAL

## 2017-06-12 MED ORDER — SODIUM CHLORIDE 0.9% FLUSH: 3.0000 mL | INTRAVENOUS | Status: DC | PRN

## 2017-06-12 MED ORDER — FENTANYL CITRATE (PF) 100 MCG/2ML IJ SOLN
INTRAMUSCULAR | Status: DC | PRN
  Administered 2017-06-12: 25 ug via INTRAVENOUS

## 2017-06-12 MED ORDER — PREDNISONE 50 MG PO TABS
50.0000 mg | ORAL_TABLET | Freq: Every day | ORAL | Status: DC
  Administered 2017-06-13 – 2017-06-15 (×3): 50 mg via ORAL
  Filled 2017-06-12 (×3): qty 1

## 2017-06-12 MED ORDER — MIDAZOLAM HCL 2 MG/2ML IJ SOLN
INTRAMUSCULAR | Status: DC | PRN
  Administered 2017-06-12 (×2): 1 mg via INTRAVENOUS

## 2017-06-12 MED ORDER — HEPARIN (PORCINE) IN NACL 2-0.9 UNIT/ML-% IJ SOLN
INTRAMUSCULAR | Status: AC
  Filled 2017-06-12: qty 1000

## 2017-06-12 MED ORDER — LIDOCAINE HCL (PF) 1 % IJ SOLN
INTRAMUSCULAR | Status: AC
  Filled 2017-06-12: qty 30

## 2017-06-12 MED ORDER — ASPIRIN 81 MG PO CHEW
81.0000 mg | CHEWABLE_TABLET | ORAL | Status: AC
  Administered 2017-06-12: 81 mg via ORAL
  Filled 2017-06-12: qty 1

## 2017-06-12 MED ORDER — ACETAMINOPHEN 325 MG PO TABS
650.0000 mg | ORAL_TABLET | ORAL | Status: DC | PRN
Start: 2017-06-12 — End: 2017-06-15
  Administered 2017-06-13: 650 mg via ORAL
  Filled 2017-06-12: qty 2

## 2017-06-12 MED ORDER — LIDOCAINE HCL (PF) 1 % IJ SOLN
INTRAMUSCULAR | Status: DC | PRN
  Administered 2017-06-12: 5 mL

## 2017-06-12 MED ORDER — IOPAMIDOL (ISOVUE-370) INJECTION 76%
INTRAVENOUS | Status: DC | PRN
  Administered 2017-06-12: 60 mL via INTRA_ARTERIAL

## 2017-06-12 MED ORDER — FENTANYL CITRATE (PF) 100 MCG/2ML IJ SOLN
INTRAMUSCULAR | Status: AC
  Filled 2017-06-12: qty 2

## 2017-06-12 MED ORDER — SODIUM CHLORIDE 0.9 % IV SOLN: INTRAVENOUS | Status: AC

## 2017-06-12 MED ORDER — HEPARIN (PORCINE) IN NACL 2-0.9 UNIT/ML-% IJ SOLN
INTRAMUSCULAR | Status: AC | PRN
  Administered 2017-06-12: 1000 mL

## 2017-06-12 MED ORDER — ONDANSETRON HCL 4 MG/2ML IJ SOLN: 4.0000 mg | Freq: Four times a day (QID) | INTRAMUSCULAR | Status: DC | PRN

## 2017-06-12 MED ORDER — SODIUM CHLORIDE 0.9% FLUSH
3.0000 mL | Freq: Two times a day (BID) | INTRAVENOUS | Status: DC
  Administered 2017-06-12: 3 mL via INTRAVENOUS

## 2017-06-12 MED ORDER — METHYLPREDNISOLONE SODIUM SUCC 125 MG IJ SOLR
60.0000 mg | Freq: Two times a day (BID) | INTRAMUSCULAR | Status: AC
  Administered 2017-06-12: 60 mg via INTRAVENOUS
  Filled 2017-06-12: qty 2

## 2017-06-12 MED ORDER — SODIUM CHLORIDE 0.9 % IV SOLN
INTRAVENOUS | Status: DC
  Administered 2017-06-12: 05:00:00 via INTRAVENOUS

## 2017-06-12 MED ORDER — ENOXAPARIN SODIUM 40 MG/0.4ML ~~LOC~~ SOLN
40.0000 mg | SUBCUTANEOUS | Status: DC
  Administered 2017-06-13: 40 mg via SUBCUTANEOUS
  Filled 2017-06-12: qty 0.4

## 2017-06-12 MED ORDER — SODIUM CHLORIDE 0.9% FLUSH
3.0000 mL | Freq: Two times a day (BID) | INTRAVENOUS | Status: DC
  Administered 2017-06-12 – 2017-06-15 (×7): 3 mL via INTRAVENOUS

## 2017-06-12 MED ORDER — IOPAMIDOL (ISOVUE-370) INJECTION 76%
INTRAVENOUS | Status: AC
  Filled 2017-06-12: qty 100

## 2017-06-12 SURGICAL SUPPLY — 15 items
CATH BALLN WEDGE 5F 110CM (CATHETERS) ×1 IMPLANT
CATH INFINITI 5 FR JL3.5 (CATHETERS) ×1 IMPLANT
CATH INFINITI 5FR ANG PIGTAIL (CATHETERS) ×1 IMPLANT
CATH INFINITI JR4 5F (CATHETERS) ×1 IMPLANT
DEVICE RAD COMP TR BAND LRG (VASCULAR PRODUCTS) ×1 IMPLANT
GLIDESHEATH SLEND SS 6F .021 (SHEATH) ×2 IMPLANT
GUIDEWIRE .025 260CM (WIRE) ×1 IMPLANT
GUIDEWIRE INQWIRE 1.5J.035X260 (WIRE) IMPLANT
INQWIRE 1.5J .035X260CM (WIRE) ×2
KIT HEART LEFT (KITS) ×2 IMPLANT
PACK CARDIAC CATHETERIZATION (CUSTOM PROCEDURE TRAY) ×2 IMPLANT
SHEATH GLIDE SLENDER 4/5FR (SHEATH) ×2 IMPLANT
SYR MEDRAD MARK V 150ML (SYRINGE) ×2 IMPLANT
TRANSDUCER W/STOPCOCK (MISCELLANEOUS) ×2 IMPLANT
TUBING CIL FLEX 10 FLL-RA (TUBING) ×2 IMPLANT

## 2017-06-12 NOTE — Progress Notes (Signed)
Received patient from cath lab to room 3E16.  Patient oriented to room and bedside table, phone, and call bell within reach.  Air in TR Band = 3cc and taken to [employer].  Band left in place and will remove in 30 minutes, pending no complications.  Patient updated on plan of care.

## 2017-06-12 NOTE — Progress Notes (Signed)
Physical Therapy Treatment Patient Details Name: Joe Garcia MRN: [id number] DOB: 05/05/1951 Today's Date: 06/12/2017    History of Present Illness Joe Garcia is a 66 y.o. male with medical history significant of HTN, HLD, CHF (EF 30-35% with grade 1 diastolic dysfunction in 54/65), and asthma presenting with SOBPt dx with acute on chronic respiratory failure with hypoxia, recent new onset of systolic CHF.  Cardiology has been consulted and wants to do a cardiac cath on him (06/12/17?).      PT Comments    Pt s/p cardiac cath this AM (radial access).  He is doing very well with gait today reporting his arthritic hip is not bothering him as much.  If he is still here on Monday PT will bring a cane to attempt gait with a cane to see if we can meet his mod I goals.  Otherwise, if he d/c over the weekend, no f/u therapy recommended.    Follow Up Recommendations  No PT follow up     Equipment Recommendations  None recommended by PT    Recommendations for Other Services   NA     Precautions / Restrictions Precautions Precautions: Other (comment) Precaution Comments: monitor breathing.  Restrictions Weight Bearing Restrictions: No    Mobility  Bed Mobility Overal bed mobility: Independent                Transfers Overall transfer level: Needs assistance Equipment used: None Transfers: Sit to/from Stand Sit to Stand: Supervision         General transfer comment: supervision for safety  Ambulation/Gait Ambulation/Gait assistance: Supervision Ambulation Distance (Feet): 300 Feet Assistive device: None Gait Pattern/deviations: Step-through pattern   Gait velocity interpretation: at or above normal speed for age/gender General Gait Details: Very mildly antalgic gait pattern today, much improved from yesterday.           Balance Overall balance assessment: Needs assistance Sitting-balance support: Feet supported;No upper extremity supported Sitting  balance-Leahy Scale: Good     Standing balance support: No upper extremity supported Standing balance-Leahy Scale: Good                              Cognition Arousal/Alertness: Awake/alert Behavior During Therapy: WFL for tasks assessed/performed Overall Cognitive Status: Within Functional Limits for tasks assessed                                               Pertinent Vitals/Pain Pain Assessment: Faces Faces Pain Scale: Hurts a little bit Pain Location: chronic right hip pain, better today than yesterday Pain Descriptors / Indicators: Aching Pain Intervention(s): Limited activity within patient's tolerance;Monitored during session;Repositioned           PT Goals (current goals can now be found in the care plan section) Acute Rehab PT Goals Patient Stated Goal: to go home and stay out of here Progress towards PT goals: Progressing toward goals    Frequency    Min 3X/week      PT Plan Current plan remains appropriate       AM-PAC PT "6 Clicks" Daily Activity  Outcome Measure  Difficulty turning over in bed (including adjusting bedclothes, sheets and blankets)?: None Difficulty moving from lying on back to sitting on the side of the bed? : None Difficulty sitting down on and  standing up from a chair with arms (e.g., wheelchair, bedside commode, etc,.)?: None Help needed moving to and from a bed to chair (including a wheelchair)?: None Help needed walking in hospital room?: None Help needed climbing 3-5 steps with a railing? : None 6 Click Score: 24    End of Session   Activity Tolerance: Patient tolerated treatment well Patient left: in bed;with call bell/phone within reach;with family/visitor present Nurse Communication: Mobility status PT Visit Diagnosis: Muscle weakness (generalized) (M62.81);Pain;Other (comment)(decreased activity tolerance) Pain - Right/Left: Right Pain - part of body: Hip     Time: 2694-8546 PT Time  Calculation (min) (ACUTE ONLY): 8 min  Charges:  $Gait Training: 8-22 mins           Sadae Arrazola B. Rondo Spittler, PT, DPT [phone]           06/12/2017, 4:16 PM

## 2017-06-12 NOTE — Progress Notes (Signed)
Retrieved patient's personal belongings from [facility].  Patient confirmed all his belongings are present.  2 Cell phones, 1 wallet, clothing, and shoes present.

## 2017-06-12 NOTE — Interval H&P Note (Signed)
History and Physical Interval Note:  06/12/2017 10:27 AM  Joe Garcia  has presented today for surgery, with the diagnosis of hf  The various methods of treatment have been discussed with the patient and family. After consideration of risks, benefits and other options for treatment, the patient has consented to  Procedure(s): RIGHT/LEFT HEART CATH AND CORONARY ANGIOGRAPHY (N/A) and possible coronary angioplasty as a surgical intervention .  The patient's history has been reviewed, patient examined, no change in status, stable for surgery.  I have reviewed the patient's chart and labs.  Questions were answered to the patient's satisfaction.     Homar Weinkauf

## 2017-06-12 NOTE — Plan of Care (Signed)
Right radial cath site = level 0 and band removed this shift per procedure.

## 2017-06-12 NOTE — Plan of Care (Signed)
Right and left heart cath performed today.

## 2017-06-12 NOTE — Progress Notes (Signed)
DAILY PROGRESS NOTE   Patient Name: Joe Garcia Date of Encounter: 06/12/2017  Chief Complaint   Breathing is better  Patient Profile   JAHRON HUNSINGER is a 66 y.o. male with a hx of HTN, HLD, Asthma and recent diagnosis of systolic HF in October, who presented to hospital with acute on chronic respiratory failure. Cardiology consulted given new systolic HF, at the request of Dr Broadus John, [facility].  Subjective   Diuresed about 1.3L yesterday - breathing is better, but weight is up today. BP remains elevated at times. Plan for [facility] today - will be helpful to know wedge pressure.  Objective   Vitals:   06/11/17 2051 06/12/17 0500 06/12/17 0515 06/12/17 0847  BP: (!) 174/96  (!) 150/76   Pulse: 85  83   Resp: 18  20   Temp: 98.4 F (36.9 C)  97.8 F (36.6 C)   TempSrc: Oral  Oral   SpO2: 94%  97% 96%  Weight:  161 lb 6 oz (73.2 kg)    Height:        Intake/Output Summary (Last 24 hours) at 06/12/2017 0848 Last data filed at 06/12/2017 0557 Gross per 24 hour  Intake 247.5 ml  Output 1600 ml  Net -1352.5 ml   Filed Weights   06/10/17 1659 06/11/17 0325 06/12/17 0500  Weight: 155 lb (70.3 kg) 154 lb 12.2 oz (70.2 kg) 161 lb 6 oz (73.2 kg)    Physical Exam   General appearance: alert and no distress Neck: no carotid bruit, no JVD and thyroid not enlarged, symmetric, no tenderness/mass/nodules Lungs: diminished breath sounds bibasilar and wheezes bilaterally Heart: regular rate and rhythm Extremities: extremities normal, atraumatic, no cyanosis or edema Neurologic: Grossly normal  Inpatient Medications    Scheduled Meds: . docusate sodium  100 mg Oral BID  . doxycycline  100 mg Oral Q12H  . enoxaparin (LOVENOX) injection  40 mg Subcutaneous Daily  . ipratropium-albuterol  3 mL Nebulization TID  . lisinopril  10 mg Oral Daily  . methylPREDNISolone (SOLU-MEDROL) injection  60 mg Intravenous Q12H  . sodium chloride flush  3 mL Intravenous Q12H     Continuous Infusions: . sodium chloride    . sodium chloride 10 mL/hr at 06/12/17 0512    PRN Meds: sodium chloride, acetaminophen **OR** acetaminophen, albuterol, hydrALAZINE, ondansetron **OR** ondansetron (ZOFRAN) IV, sodium chloride flush   Labs   Results for orders placed or performed during the hospital encounter of 06/10/17 (from the past 48 hour(s))  Basic metabolic panel     Status: None   Collection Time: 06/10/17  5:10 PM  Result Value Ref Range   Sodium 136 135 - 145 mmol/L   Potassium 3.5 3.5 - 5.1 mmol/L   Chloride 102 101 - 111 mmol/L   CO2 23 22 - 32 mmol/L   Glucose, Bld 88 65 - 99 mg/dL   BUN 12 6 - 20 mg/dL   Creatinine, Ser 1.02 0.61 - 1.24 mg/dL   Calcium 9.0 8.9 - 10.3 mg/dL   GFR calc non Af Amer >60 >60 mL/min   GFR calc Af Amer >60 >60 mL/min    Comment: (NOTE) The eGFR has been calculated using the CKD EPI equation. This calculation has not been validated in all clinical situations. eGFR's persistently <60 mL/min signify possible Chronic Kidney Disease.    Anion gap 11 5 - 15  CBC     Status: None   Collection Time: 06/10/17  5:10 PM  Result Value  Ref Range   WBC 8.8 4.0 - 10.5 K/uL   RBC 4.83 4.22 - 5.81 MIL/uL   Hemoglobin 13.6 13.0 - 17.0 g/dL   HCT 40.9 39.0 - 52.0 %   MCV 84.7 78.0 - 100.0 fL   MCH 28.2 26.0 - 34.0 pg   MCHC 33.3 30.0 - 36.0 g/dL   RDW 14.5 11.5 - 15.5 %   Platelets 282 150 - 400 K/uL  Brain natriuretic peptide     Status: Abnormal   Collection Time: 06/10/17  5:10 PM  Result Value Ref Range   B Natriuretic Peptide 298.6 (H) 0.0 - 100.0 pg/mL  I-stat troponin, ED     Status: None   Collection Time: 06/10/17  5:21 PM  Result Value Ref Range   Troponin i, poc 0.00 0.00 - 0.08 ng/mL   Comment 3            Comment: Due to the release kinetics of cTnI, a negative result within the first hours of the onset of symptoms does not rule out myocardial infarction with certainty. If myocardial infarction is still  suspected, repeat the test at appropriate intervals.   Basic metabolic panel     Status: Abnormal   Collection Time: 06/11/17  6:08 AM  Result Value Ref Range   Sodium 136 135 - 145 mmol/L   Potassium 3.8 3.5 - 5.1 mmol/L   Chloride 105 101 - 111 mmol/L   CO2 20 (L) 22 - 32 mmol/L   Glucose, Bld 233 (H) 65 - 99 mg/dL   BUN 12 6 - 20 mg/dL   Creatinine, Ser 1.13 0.61 - 1.24 mg/dL   Calcium 8.5 (L) 8.9 - 10.3 mg/dL   GFR calc non Af Amer >60 >60 mL/min   GFR calc Af Amer >60 >60 mL/min    Comment: (NOTE) The eGFR has been calculated using the CKD EPI equation. This calculation has not been validated in all clinical situations. eGFR's persistently <60 mL/min signify possible Chronic Kidney Disease.    Anion gap 11 5 - 15  CBC     Status: Abnormal   Collection Time: 06/11/17  6:08 AM  Result Value Ref Range   WBC 4.0 4.0 - 10.5 K/uL   RBC 4.47 4.22 - 5.81 MIL/uL   Hemoglobin 12.3 (L) 13.0 - 17.0 g/dL   HCT 37.7 (L) 39.0 - 52.0 %   MCV 84.3 78.0 - 100.0 fL   MCH 27.5 26.0 - 34.0 pg   MCHC 32.6 30.0 - 36.0 g/dL   RDW 14.4 11.5 - 15.5 %   Platelets 253 150 - 400 K/uL  Basic metabolic panel     Status: Abnormal   Collection Time: 06/12/17  4:00 AM  Result Value Ref Range   Sodium 138 135 - 145 mmol/L   Potassium 4.1 3.5 - 5.1 mmol/L   Chloride 108 101 - 111 mmol/L   CO2 22 22 - 32 mmol/L   Glucose, Bld 156 (H) 65 - 99 mg/dL   BUN 16 6 - 20 mg/dL   Creatinine, Ser 1.15 0.61 - 1.24 mg/dL   Calcium 9.1 8.9 - 10.3 mg/dL   GFR calc non Af Amer >60 >60 mL/min   GFR calc Af Amer >60 >60 mL/min    Comment: (NOTE) The eGFR has been calculated using the CKD EPI equation. This calculation has not been validated in all clinical situations. eGFR's persistently <60 mL/min signify possible Chronic Kidney Disease.    Anion gap 8 5 - 15  Protime-INR     Status: None   Collection Time: 06/12/17  4:00 AM  Result Value Ref Range   Prothrombin Time 13.1 11.4 - 15.2 seconds   INR 1.00    CBC     Status: Abnormal   Collection Time: 06/12/17  4:00 AM  Result Value Ref Range   WBC 7.5 4.0 - 10.5 K/uL   RBC 4.42 4.22 - 5.81 MIL/uL   Hemoglobin 12.3 (L) 13.0 - 17.0 g/dL   HCT 37.4 (L) 39.0 - 52.0 %   MCV 84.6 78.0 - 100.0 fL   MCH 27.8 26.0 - 34.0 pg   MCHC 32.9 30.0 - 36.0 g/dL   RDW 15.0 11.5 - 15.5 %   Platelets 247 150 - 400 K/uL    ECG   N/A  Telemetry   Sinus rhythm - Personally Reviewed  Radiology    Dg Chest 2 View  Result Date: 06/10/2017 CLINICAL DATA:  Shortness of breath 3 days.  History of asthma. EXAM: CHEST  2 VIEW COMPARISON:  05/04/2017 and 10/29/2016 . FINDINGS: Lungs are adequately inflated without focal consolidation or effusion. Cardiomediastinal silhouette is within normal. There is moderate degenerative change of the spine. Mild stable wedging of several adjacent midthoracic vertebral bodies IMPRESSION: No active cardiopulmonary disease. Electronically Signed   By: Marin Olp M.D.   On: 06/10/2017 17:44    Cardiac Studies   N/A  Assessment   1. Principal Problem: 2.   Acute on chronic respiratory failure with hypoxia (HCC) 3. Active Problems: 4.   Essential hypertension 5.   Asthma exacerbation 6.   Chronic combined systolic (congestive) and diastolic (congestive) heart failure ([place]) 7.   Plan   1. Plan for [facility] today for cardiomyopathy - would be helpful to repeat LV gram as it has been about 1 months since his echo - LVEF may have improved somewhat, but still appears to have an element of decompensated CHF. BP remains elevated, increase lisiniopril to 20 mg daily.  Time Spent Directly with Patient:  I have spent a total of 25 minutes with the patient reviewing hospital notes, telemetry, EKGs, labs and examining the patient as well as establishing an assessment and plan that was discussed personally with the patient. > 50% of time was spent in direct patient care.  Length of Stay:  LOS: 1 day   Pixie Casino, MD,  [facility], [place] Director of the Advanced Lipid Disorders &  Cardiovascular Risk Reduction Clinic Attending Cardiologist  Direct Dial: [phone]  Fax: [phone]  Website:  [email or web address] 06/12/2017, 8:48 AM

## 2017-06-12 NOTE — Progress Notes (Signed)
   06/12/17 1100  OT Visit Information  Last OT Received On 06/12/17  Assistance Needed +1  Reason Eval/Treat Not Completed Patient at procedure or test/ unavailable  History of Present Illness Joe Garcia is a 66 y.o. male with medical history significant of HTN, HLD, CHF (EF 30-35% with grade 1 diastolic dysfunction in 54/09), and asthma presenting with SOBPt dx with acute on chronic respiratory failure with hypoxia, recent new onset of systolic CHF.  Cardiology has been consulted and wants to do a cardiac cath on him (06/12/17?).     Pt in cath lab.  OT to check back later as time allows.   Tyrone Schimke OTR/L Pager: [phone]

## 2017-06-12 NOTE — Progress Notes (Signed)
PT Cancellation Note  Patient Details Name: Joe Garcia MRN: [id number] DOB: 06-17-1951   Cancelled Treatment:    Reason Eval/Treat Not Completed: Patient at procedure or test/unavailable.  Pt in cath lab.  PT to check back later as time allows.   Thanks,    Barbarann Ehlers. Cecille Mcclusky, PT, DPT [phone]   06/12/2017, 10:07 AM

## 2017-06-12 NOTE — H&P (View-Only) (Signed)
 DAILY PROGRESS NOTE   Patient Name: Joe Garcia Date of Encounter: 06/12/2017  Chief Complaint   Breathing is better  Patient Profile   Joe Garcia is a 66 y.o. male with a hx of HTN, HLD, Asthma and recent diagnosis of systolic HF in October, who presented to hospital with acute on chronic respiratory failure. Cardiology consulted given new systolic HF, at the request of Dr Joseph, [facility].  Subjective   Diuresed about 1.3L yesterday - breathing is better, but weight is up today. BP remains elevated at times. Plan for L/RHC today - will be helpful to know wedge pressure.  Objective   Vitals:   06/11/17 2051 06/12/17 0500 06/12/17 0515 06/12/17 0847  BP: (!) 174/96  (!) 150/76   Pulse: 85  83   Resp: 18  20   Temp: 98.4 F (36.9 C)  97.8 F (36.6 C)   TempSrc: Oral  Oral   SpO2: 94%  97% 96%  Weight:  161 lb 6 oz (73.2 kg)    Height:        Intake/Output Summary (Last 24 hours) at 06/12/2017 0848 Last data filed at 06/12/2017 0557 Gross per 24 hour  Intake 247.5 ml  Output 1600 ml  Net -1352.5 ml   Filed Weights   06/10/17 1659 06/11/17 0325 06/12/17 0500  Weight: 155 lb (70.3 kg) 154 lb 12.2 oz (70.2 kg) 161 lb 6 oz (73.2 kg)    Physical Exam   General appearance: alert and no distress Neck: no carotid bruit, no JVD and thyroid not enlarged, symmetric, no tenderness/mass/nodules Lungs: diminished breath sounds bibasilar and wheezes bilaterally Heart: regular rate and rhythm Extremities: extremities normal, atraumatic, no cyanosis or edema Neurologic: Grossly normal  Inpatient Medications    Scheduled Meds: . docusate sodium  100 mg Oral BID  . doxycycline  100 mg Oral Q12H  . enoxaparin (LOVENOX) injection  40 mg Subcutaneous Daily  . ipratropium-albuterol  3 mL Nebulization TID  . lisinopril  10 mg Oral Daily  . methylPREDNISolone (SOLU-MEDROL) injection  60 mg Intravenous Q12H  . sodium chloride flush  3 mL Intravenous Q12H     Continuous Infusions: . sodium chloride    . sodium chloride 10 mL/hr at 06/12/17 0512    PRN Meds: sodium chloride, acetaminophen **OR** acetaminophen, albuterol, hydrALAZINE, ondansetron **OR** ondansetron (ZOFRAN) IV, sodium chloride flush   Labs   Results for orders placed or performed during the hospital encounter of 06/10/17 (from the past 48 hour(s))  Basic metabolic panel     Status: None   Collection Time: 06/10/17  5:10 PM  Result Value Ref Range   Sodium 136 135 - 145 mmol/L   Potassium 3.5 3.5 - 5.1 mmol/L   Chloride 102 101 - 111 mmol/L   CO2 23 22 - 32 mmol/L   Glucose, Bld 88 65 - 99 mg/dL   BUN 12 6 - 20 mg/dL   Creatinine, Ser 1.02 0.61 - 1.24 mg/dL   Calcium 9.0 8.9 - 10.3 mg/dL   GFR calc non Af Amer >60 >60 mL/min   GFR calc Af Amer >60 >60 mL/min    Comment: (NOTE) The eGFR has been calculated using the CKD EPI equation. This calculation has not been validated in all clinical situations. eGFR's persistently <60 mL/min signify possible Chronic Kidney Disease.    Anion gap 11 5 - 15  CBC     Status: None   Collection Time: 06/10/17  5:10 PM  Result Value   Ref Range   WBC 8.8 4.0 - 10.5 K/uL   RBC 4.83 4.22 - 5.81 MIL/uL   Hemoglobin 13.6 13.0 - 17.0 g/dL   HCT 40.9 39.0 - 52.0 %   MCV 84.7 78.0 - 100.0 fL   MCH 28.2 26.0 - 34.0 pg   MCHC 33.3 30.0 - 36.0 g/dL   RDW 14.5 11.5 - 15.5 %   Platelets 282 150 - 400 K/uL  Brain natriuretic peptide     Status: Abnormal   Collection Time: 06/10/17  5:10 PM  Result Value Ref Range   B Natriuretic Peptide 298.6 (H) 0.0 - 100.0 pg/mL  I-stat troponin, ED     Status: None   Collection Time: 06/10/17  5:21 PM  Result Value Ref Range   Troponin i, poc 0.00 0.00 - 0.08 ng/mL   Comment 3            Comment: Due to the release kinetics of cTnI, a negative result within the first hours of the onset of symptoms does not rule out myocardial infarction with certainty. If myocardial infarction is still  suspected, repeat the test at appropriate intervals.   Basic metabolic panel     Status: Abnormal   Collection Time: 06/11/17  6:08 AM  Result Value Ref Range   Sodium 136 135 - 145 mmol/L   Potassium 3.8 3.5 - 5.1 mmol/L   Chloride 105 101 - 111 mmol/L   CO2 20 (L) 22 - 32 mmol/L   Glucose, Bld 233 (H) 65 - 99 mg/dL   BUN 12 6 - 20 mg/dL   Creatinine, Ser 1.13 0.61 - 1.24 mg/dL   Calcium 8.5 (L) 8.9 - 10.3 mg/dL   GFR calc non Af Amer >60 >60 mL/min   GFR calc Af Amer >60 >60 mL/min    Comment: (NOTE) The eGFR has been calculated using the CKD EPI equation. This calculation has not been validated in all clinical situations. eGFR's persistently <60 mL/min signify possible Chronic Kidney Disease.    Anion gap 11 5 - 15  CBC     Status: Abnormal   Collection Time: 06/11/17  6:08 AM  Result Value Ref Range   WBC 4.0 4.0 - 10.5 K/uL   RBC 4.47 4.22 - 5.81 MIL/uL   Hemoglobin 12.3 (L) 13.0 - 17.0 g/dL   HCT 37.7 (L) 39.0 - 52.0 %   MCV 84.3 78.0 - 100.0 fL   MCH 27.5 26.0 - 34.0 pg   MCHC 32.6 30.0 - 36.0 g/dL   RDW 14.4 11.5 - 15.5 %   Platelets 253 150 - 400 K/uL  Basic metabolic panel     Status: Abnormal   Collection Time: 06/12/17  4:00 AM  Result Value Ref Range   Sodium 138 135 - 145 mmol/L   Potassium 4.1 3.5 - 5.1 mmol/L   Chloride 108 101 - 111 mmol/L   CO2 22 22 - 32 mmol/L   Glucose, Bld 156 (H) 65 - 99 mg/dL   BUN 16 6 - 20 mg/dL   Creatinine, Ser 1.15 0.61 - 1.24 mg/dL   Calcium 9.1 8.9 - 10.3 mg/dL   GFR calc non Af Amer >60 >60 mL/min   GFR calc Af Amer >60 >60 mL/min    Comment: (NOTE) The eGFR has been calculated using the CKD EPI equation. This calculation has not been validated in all clinical situations. eGFR's persistently <60 mL/min signify possible Chronic Kidney Disease.    Anion gap 8 5 - 15    Protime-INR     Status: None   Collection Time: 06/12/17  4:00 AM  Result Value Ref Range   Prothrombin Time 13.1 11.4 - 15.2 seconds   INR 1.00    CBC     Status: Abnormal   Collection Time: 06/12/17  4:00 AM  Result Value Ref Range   WBC 7.5 4.0 - 10.5 K/uL   RBC 4.42 4.22 - 5.81 MIL/uL   Hemoglobin 12.3 (L) 13.0 - 17.0 g/dL   HCT 37.4 (L) 39.0 - 52.0 %   MCV 84.6 78.0 - 100.0 fL   MCH 27.8 26.0 - 34.0 pg   MCHC 32.9 30.0 - 36.0 g/dL   RDW 15.0 11.5 - 15.5 %   Platelets 247 150 - 400 K/uL    ECG   N/A  Telemetry   Sinus rhythm - Personally Reviewed  Radiology    Dg Chest 2 View  Result Date: 06/10/2017 CLINICAL DATA:  Shortness of breath 3 days.  History of asthma. EXAM: CHEST  2 VIEW COMPARISON:  05/04/2017 and 10/29/2016 . FINDINGS: Lungs are adequately inflated without focal consolidation or effusion. Cardiomediastinal silhouette is within normal. There is moderate degenerative change of the spine. Mild stable wedging of several adjacent midthoracic vertebral bodies IMPRESSION: No active cardiopulmonary disease. Electronically Signed   By: Marin Olp M.D.   On: 06/10/2017 17:44    Cardiac Studies   N/A  Assessment   1. Principal Problem: 2.   Acute on chronic respiratory failure with hypoxia (HCC) 3. Active Problems: 4.   Essential hypertension 5.   Asthma exacerbation 6.   Chronic combined systolic (congestive) and diastolic (congestive) heart failure ([place]) 7.   Plan   1. Plan for [facility] today for cardiomyopathy - would be helpful to repeat LV gram as it has been about 1 months since his echo - LVEF may have improved somewhat, but still appears to have an element of decompensated CHF. BP remains elevated, increase lisiniopril to 20 mg daily.  Time Spent Directly with Patient:  I have spent a total of 25 minutes with the patient reviewing hospital notes, telemetry, EKGs, labs and examining the patient as well as establishing an assessment and plan that was discussed personally with the patient. > 50% of time was spent in direct patient care.  Length of Stay:  LOS: 1 day   Pixie Casino, MD,  [facility], [place] Director of the Advanced Lipid Disorders &  Cardiovascular Risk Reduction Clinic Attending Cardiologist  Direct Dial: [phone]  Fax: [phone]  Website:  [email or web address] 06/12/2017, 8:48 AM

## 2017-06-12 NOTE — Progress Notes (Addendum)
PROGRESS NOTE    Joe Garcia  WUX:[id number] DOB: [birth year] DOA: 06/10/2017 PCP: Medicine, [facility]  Brief Narrative:Joe Garcia is a 66 y.o. male with medical history significant of HTN, HLD, CHF (EF 30-35% with grade 1 diastolic dysfunction diagnosed in 10/18), and asthma presenting with SOB.  Patient has been sick with a head cold.  It started draining down into his chest.  Cold started about last Saturday.  +SOB.  +cough, productive of clear sputum.  +wheezing.  +shakes. CXR clear  Assessment & Plan:   Acute on chronic respiratory failure with hypoxia (HCC) -primarily due to Asthma exacerbation,  -also has new significant systolic CHF and could have underlying CAD, but appears euvolemic now -Continue IV solumedrol, will cut down dose and transition to prednisone tomorrow -continue nebs and Doxy, CXR clear  Recent New Onset Systolic CHF -[facility] and diagnosed with new onset systolic CHF EF 25-36% and advised to FU with Cardiology which hasnt happened -appreciate Cards input, Cath today -continue lisinopril  HTN -stable now, continue lisinopril  Asthma exacerbation -as above  Hyperglycemia, likely from steroids -monitor, check hba1c  Tobacco abuse -intermittent, counseled  DVT prophylaxis: lovenox Code Status: Full Code Family Communication: No family at bedside Disposition Plan: Home pending workup  Consultants:   Cards   Procedures:   Antimicrobials:  Doxy 11/14  Subjective: -continues to breath better, wheezing less  Objective: Vitals:   06/12/17 1250 06/12/17 1305 06/12/17 1341 06/12/17 1416  BP: (!) 153/99 (!) 157/62 (!) 156/87 (!) 144/85  Pulse: (!) 39 71 83 86  Resp: 20 15 16 16   Temp:   98 F (36.7 C)   TempSrc:   Oral   SpO2: 99% 99% 98% 97%  Weight:      Height:        Intake/Output Summary (Last 24 hours) at 06/12/2017 1443 Last data filed at 06/12/2017 1409 Gross per 24  hour  Intake 487.5 ml  Output 2100 ml  Net -1612.5 ml   Filed Weights   06/10/17 1659 06/11/17 0325 06/12/17 0500  Weight: 70.3 kg (155 lb) 70.2 kg (154 lb 12.2 oz) 73.2 kg (161 lb 6 oz)    Examination:  Gen: Awake, Alert, Oriented X 3, thinly built cachectic male HEENT: PERRLA, Neck supple, no JVD Lungs: rare exp wheezes CVS: RRR,No Gallops,Rubs or new Murmurs Abd: soft, Non tender, non distended, BS present Extremities: No Cyanosis, Clubbing or edema Skin: no new rashes Psychiatry: Judgement and insight appear normal. Mood & affect appropriate.   Data Reviewed:   CBC: Recent Labs  Lab 06/10/17 1710 06/11/17 0608 06/12/17 0400  WBC 8.8 4.0 7.5  HGB 13.6 12.3* 12.3*  HCT 40.9 37.7* 37.4*  MCV 84.7 84.3 84.6  PLT 282 253 644   Basic Metabolic Panel: Recent Labs  Lab 06/10/17 1710 06/11/17 0608 06/12/17 0400  NA 136 136 138  K 3.5 3.8 4.1  CL 102 105 108  CO2 23 20* 22  GLUCOSE 88 233* 156*  BUN 12 12 16   CREATININE 1.02 1.13 1.15  CALCIUM 9.0 8.5* 9.1   GFR: Estimated Creatinine Clearance: 67.2 mL/min (by C-G formula based on SCr of 1.15 mg/dL). Liver Function Tests: No results for input(s): AST, ALT, ALKPHOS, BILITOT, PROT, ALBUMIN in the last 168 hours. No results for input(s): LIPASE, AMYLASE in the last 168 hours. No results for input(s): AMMONIA in the last 168 hours. Coagulation Profile: Recent Labs  Lab 06/12/17 0400  INR 1.00   Cardiac Enzymes: No results for input(s): CKTOTAL, CKMB, CKMBINDEX, TROPONINI in the last 168 hours. BNP (last 3 results) No results for input(s): PROBNP in the last 8760 hours. HbA1C: No results for input(s): HGBA1C in the last 72 hours. CBG: No results for input(s): GLUCAP in the last 168 hours. Lipid Profile: No results for input(s): CHOL, HDL, LDLCALC, TRIG, CHOLHDL, LDLDIRECT in the last 72 hours. Thyroid Function Tests: No results for input(s): TSH, T4TOTAL, FREET4, T3FREE, THYROIDAB in the last 72  hours. Anemia Panel: No results for input(s): VITAMINB12, FOLATE, FERRITIN, TIBC, IRON, RETICCTPCT in the last 72 hours. Urine analysis:    Component Value Date/Time   COLORURINE YELLOW 07/16/2013 1404   APPEARANCEUR CLEAR 07/16/2013 1404   LABSPEC 1.017 07/16/2013 1404   PHURINE 6.5 07/16/2013 1404   GLUCOSEU NEGATIVE 07/16/2013 1404   HGBUR NEGATIVE 07/16/2013 1404   BILIRUBINUR NEGATIVE 07/16/2013 1404   KETONESUR NEGATIVE 07/16/2013 1404   PROTEINUR NEGATIVE 07/16/2013 1404   UROBILINOGEN 1.0 07/16/2013 1404   NITRITE NEGATIVE 07/16/2013 1404   LEUKOCYTESUR NEGATIVE 07/16/2013 1404   Sepsis Labs: @LABRCNTIP (procalcitonin:4,lacticidven:4)  )No results found for this or any previous visit (from the past 240 hour(s)).       Radiology Studies: Dg Chest 2 View  Result Date: 06/10/2017 CLINICAL DATA:  Shortness of breath 3 days.  History of asthma. EXAM: CHEST  2 VIEW COMPARISON:  05/04/2017 and 10/29/2016 . FINDINGS: Lungs are adequately inflated without focal consolidation or effusion. Cardiomediastinal silhouette is within normal. There is moderate degenerative change of the spine. Mild stable wedging of several adjacent midthoracic vertebral bodies IMPRESSION: No active cardiopulmonary disease. Electronically Signed   By: Marin Olp M.D.   On: 06/10/2017 17:44        Scheduled Meds: . docusate sodium  100 mg Oral BID  . doxycycline  100 mg Oral Q12H  . [START ON 06/13/2017] enoxaparin (LOVENOX) injection  40 mg Subcutaneous Q24H  . ipratropium-albuterol  3 mL Nebulization TID  . lisinopril  20 mg Oral Daily  . methylPREDNISolone (SOLU-MEDROL) injection  60 mg Intravenous Q12H  . sodium chloride flush  3 mL Intravenous Q12H   Continuous Infusions: . sodium chloride 50 mL/hr (06/12/17 1135)  . sodium chloride       LOS: 1 day    Time spent: 29min    Domenic Polite, MD [facility] Page via [email or web address], password [email or web address] After 7PM please contact  night-coverage  06/12/2017, 2:43 PM

## 2017-06-13 DIAGNOSIS — I428 Other cardiomyopathies: Secondary | ICD-10-CM

## 2017-06-13 DIAGNOSIS — I493 Ventricular premature depolarization: Secondary | ICD-10-CM

## 2017-06-13 DIAGNOSIS — J45901 Unspecified asthma with (acute) exacerbation: Secondary | ICD-10-CM

## 2017-06-13 LAB — BASIC METABOLIC PANEL
ANION GAP: 8 (ref 5–15)
BUN: 15 mg/dL (ref 6–20)
CALCIUM: 9 mg/dL (ref 8.9–10.3)
CO2: 25 mmol/L (ref 22–32)
Chloride: 105 mmol/L (ref 101–111)
Creatinine, Ser: 0.97 mg/dL (ref 0.61–1.24)
GFR calc Af Amer: >60 mL/min (ref >60)
GLUCOSE: 117 mg/dL — AB (ref 65–99)
Potassium: 3.9 mmol/L (ref 3.5–5.1)
Sodium: 138 mmol/L (ref 135–145)

## 2017-06-13 MED ORDER — HYDRALAZINE HCL 20 MG/ML IJ SOLN
5.0000 mg | INTRAMUSCULAR | Status: DC | PRN
  Filled 2017-06-13: qty 1

## 2017-06-13 MED ORDER — AMIODARONE HCL 200 MG PO TABS
400.0000 mg | ORAL_TABLET | Freq: Two times a day (BID) | ORAL | Status: DC
  Administered 2017-06-13 – 2017-06-15 (×5): 400 mg via ORAL
  Filled 2017-06-13 (×5): qty 2

## 2017-06-13 MED ORDER — AMIODARONE HCL 200 MG PO TABS: 200.0000 mg | ORAL_TABLET | Freq: Two times a day (BID) | ORAL | Status: DC

## 2017-06-13 MED ORDER — SACUBITRIL-VALSARTAN 49-51 MG PO TABS
1.0000 | ORAL_TABLET | Freq: Two times a day (BID) | ORAL | Status: DC
  Filled 2017-06-13: qty 1

## 2017-06-13 MED ORDER — AMIODARONE HCL 200 MG PO TABS: 200.0000 mg | ORAL_TABLET | Freq: Every day | ORAL | Status: DC

## 2017-06-13 NOTE — Progress Notes (Addendum)
Patient ID: Joe Garcia, male   DOB: April 05, 1951, 66 y.o.   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 Patient also had cough productive of clear sputum, wheezing. CXR did not show acute cardiopulmonary findings.    Assessment & Plan:   Principal Problem: Acute on chronic respiratory failure with hypoxia (HCC) / Acute moderate persistent asthma exacerbation  - Hypoxia due to combination of asthma and decompensated CHF but appears euvolic at this point - Continue doxycycline empirically - Blood cultures not ordered on admission to guide abx management  - Continue Duoneb TID scheduled and albuterol every 4 hours PRN shortness of breath or wheezing  - Continue prednisone - Continue oxygen support via [place] to keep O2 saturation above 90%  Active Problems: Acute systolic and diastolic CHF - BNP on admission 298 (BNP about 1 month ago 509) - ECHO on 10/10 showed EF 30-35% with grade 1 DD - Cath on this admission showed similar EF 25-30%, normal coronaries, frequent PVC's - Appreciate cardio input on medical management - For now continue lisinopril - Weight in past 24 hours: 154 lbs --> 161 lbs  Essential hypertension - Continue lisinopril  Steroid induced hyperglycemia - Glucose 156 on BMP  Tobacco abuse - Counseled on smoking cessation    DVT prophylaxis: Lovenox subQ Code Status: full code  Family Communication: no family at the bedside Disposition Plan: home once cleared by cardio   Consultants:   Cardiology, Dr. Glori Bickers  PT  Procedures:     Heart cath - 06/12/2017 - normal coronaries, EF 25-30%, frequent PVC's  Antimicrobials:   Doxycycline 06/10/2017 -->   Subjective: No overnight events.  Objective: Vitals:   06/12/17 1949 06/12/17 2249 06/13/17 0606 06/13/17 0636  BP: (!) 169/81 (!) 144/67 (!) 163/106 (!) 150/60  Pulse: 80 70 60   Resp: 18 18 18 20   Temp: 98 F (36.7 C) 98 F (36.7 C) (!) 97.3 F (36.3 C)   TempSrc: Oral Oral Oral   SpO2: 98% 97% 98%   Weight:   69.5 kg (153 lb 3.2 oz)   Height:        Intake/Output Summary (Last 24 hours) at 06/13/2017 0740 Last data filed at 06/13/2017 0436 Gross per 24 hour  Intake 923.33 ml  Output 1950 ml  Net -1026.67 ml   Filed Weights   06/11/17 0325 06/12/17 0500 06/13/17 0606  Weight: 70.2 kg (154 lb 12.2 oz) 73.2 kg (161 lb 6 oz) 69.5 kg (153 lb 3.2 oz)    Examination:  General exam: Appears calm and comfortable  Respiratory system: some wheezing in upper lung lobes, no stridor  Cardiovascular system: S1 & S2 heard, Rate controlled  Gastrointestinal system: Abdomen is nondistended, soft and nontender. No organomegaly or masses felt. Normal bowel sounds heard. Central nervous system: Alert and oriented. No focal neurological deficits. Extremities: Symmetric 5 x 5 power. Skin: No rashes, lesions or ulcers Psychiatry: Judgement and insight appear normal. Mood & affect appropriate.   Data Reviewed: I have personally reviewed following labs and imaging studies  CBC: Recent Labs  Lab 06/10/17 1710 06/11/17 6979  06/12/17 0400  WBC 8.8 4.0 7.5  HGB 13.6 12.3* 12.3*  HCT 40.9 37.7* 37.4*  MCV 84.7 84.3 84.6  PLT 282 253 220   Basic Metabolic Panel: Recent Labs  Lab 06/10/17 1710 06/11/17 0608 06/12/17 0400  NA 136 136 138  K 3.5 3.8 4.1  CL 102 105 108  CO2 23 20* 22  GLUCOSE 88 233* 156*  BUN 12 12 16   CREATININE 1.02 1.13 1.15  CALCIUM 9.0 8.5* 9.1   GFR: Estimated Creatinine Clearance: 63.8 mL/min (by C-G formula based on SCr of  1.15 mg/dL). Liver Function Tests: No results for input(s): AST, ALT, ALKPHOS, BILITOT, PROT, ALBUMIN in the last 168 hours. No results for input(s): LIPASE, AMYLASE in the last 168 hours. No results for input(s): AMMONIA in the last 168 hours. Coagulation Profile: Recent Labs  Lab 06/12/17 0400  INR 1.00   Cardiac Enzymes: No results for input(s): CKTOTAL, CKMB, CKMBINDEX, TROPONINI in the last 168 hours. BNP (last 3 results) No results for input(s): PROBNP in the last 8760 hours. HbA1C: No results for input(s): HGBA1C in the last 72 hours. CBG: No results for input(s): GLUCAP in the last 168 hours. Lipid Profile: No results for input(s): CHOL, HDL, LDLCALC, TRIG, CHOLHDL, LDLDIRECT in the last 72 hours. Thyroid Function Tests: No results for input(s): TSH, T4TOTAL, FREET4, T3FREE, THYROIDAB in the last 72 hours. Anemia Panel: No results for input(s): VITAMINB12, FOLATE, FERRITIN, TIBC, IRON, RETICCTPCT in the last 72 hours. Urine analysis:  Sepsis Labs: @LABRCNTIP (procalcitonin:4,lacticidven:4)   )No results found for this or any previous visit (from the past 240 hour(s)).    Radiology Studies: Dg Chest 2 View Result Date: 06/10/2017 No active cardiopulmonary disease.    Scheduled Meds: . docusate sodium  100 mg Oral BID  . doxycycline  100 mg Oral Q12H  . enoxaparin   40 mg Subcutaneous Q24H  . ipratropium-albutero  3 mL Nebulization TID  . lisinopril  20 mg Oral Daily  . predniSONE  50 mg Oral Q breakfast   Continuous Infusions: . sodium chloride       LOS: 2 days    Time spent: 25 minutes  Greater than 50% of the time spent on counseling and coordinating the care.   Leisa Lenz, MD [facility] Pager [phone]  If 7PM-7AM, please contact night-coverage [email or web address] Password [email or web address] 06/13/2017, 7:40 AM

## 2017-06-13 NOTE — Progress Notes (Signed)
DAILY PROGRESS NOTE   Patient Name: Joe Garcia Date of Encounter: 06/13/2017  Chief Complaint   He feels SOB today.  Patient Profile   Joe Garcia is a 66 y.o. male with a hx of HTN, HLD, Asthma and recent diagnosis of systolic HF in October, who presented to hospital with acute on chronic respiratory failure. Cardiology consulted given new systolic HF, at the request of Dr Broadus John, [facility].  Subjective   Diuresed about 1.3L yesterday - breathing is better, but weight is up today. BP remains elevated at times. Plan for [facility] today - will be helpful to know wedge pressure.  Objective   Vitals:   06/12/17 2249 06/13/17 0606 06/13/17 0636 06/13/17 0804  BP: (!) 144/67 (!) 163/106 (!) 150/60   Pulse: 70 60    Resp: _0 Temp: 98 F (36.7 C) (!) 97.3 F (36.3 C)    TempSrc: Oral Oral    SpO2: 97% 98%  97%  Weight:  153 lb 3.2 oz (69.5 kg)    Height:        Intake/Output Summary (Last 24 hours) at 06/13/2017 1129 Last data filed at 06/13/2017 1036 Gross per 24 hour  Intake 1338.33 ml  Output 2350 ml  Net -1011.67 ml   Filed Weights   06/11/17 0325 06/12/17 0500 06/13/17 0606  Weight: 154 lb 12.2 oz (70.2 kg) 161 lb 6 oz (73.2 kg) 153 lb 3.2 oz (69.5 kg)    Physical Exam   General appearance: alert and no distress Neck: no carotid bruit, no JVD and thyroid not enlarged, symmetric, no tenderness/mass/nodules Lungs: diminished breath sounds bibasilar and wheezes bilaterally Heart: regular rate and rhythm Extremities: extremities normal, atraumatic, no cyanosis or edema Neurologic: Grossly normal  Inpatient Medications    Scheduled Meds: . docusate sodium  100 mg Oral BID  . doxycycline  100 mg Oral Q12H  . enoxaparin (LOVENOX) injection  40 mg Subcutaneous Q24H  . ipratropium-albuterol  3 mL Nebulization TID  . lisinopril  20 mg Oral Daily  . predniSONE  50 mg Oral Q breakfast  . sodium chloride flush  3 mL Intravenous Q12H     Continuous Infusions: . sodium chloride      PRN Meds: sodium chloride, acetaminophen, albuterol, hydrALAZINE, ondansetron **OR** ondansetron (ZOFRAN) IV, ondansetron (ZOFRAN) IV, sodium chloride flush   Labs   Results for orders placed or performed during the hospital encounter of 06/10/17 (from the past 48 hour(s))  Basic metabolic panel     Status: Abnormal   Collection Time: 06/12/17  4:00 AM  Result Value Ref Range   Sodium 138 135 - 145 mmol/L   Potassium 4.1 3.5 - 5.1 mmol/L   Chloride 108 101 - 111 mmol/L   CO2 22 22 - 32 mmol/L   Glucose, Bld 156 (H) 65 - 99 mg/dL   BUN 16 6 - 20 mg/dL   Creatinine, Ser 1.15 0.61 - 1.24 mg/dL   Calcium 9.1 8.9 - 10.3 mg/dL   GFR calc non Af Amer >60 >60 mL/min   GFR calc Af Amer >60 >60 mL/min    Comment: (NOTE) The eGFR has been calculated using the CKD EPI equation. This calculation has not been validated in all clinical situations. eGFR's persistently <60 mL/min signify possible Chronic Kidney Disease.    Anion gap 8 5 - 15  Protime-INR     Status: None   Collection Time: 06/12/17  4:00 AM  Result Value Ref Range  Prothrombin Time 13.1 11.4 - 15.2 seconds   INR 1.00   CBC     Status: Abnormal   Collection Time: 06/12/17  4:00 AM  Result Value Ref Range   WBC 7.5 4.0 - 10.5 K/uL   RBC 4.42 4.22 - 5.81 MIL/uL   Hemoglobin 12.3 (L) 13.0 - 17.0 g/dL   HCT 37.4 (L) 39.0 - 52.0 %   MCV 84.6 78.0 - 100.0 fL   MCH 27.8 26.0 - 34.0 pg   MCHC 32.9 30.0 - 36.0 g/dL   RDW 15.0 11.5 - 15.5 %   Platelets 247 150 - 400 K/uL  I-STAT 3, arterial blood gas (G3+)     Status: Abnormal   Collection Time: 06/12/17 10:40 AM  Result Value Ref Range   pH, Arterial 7.397 7.350 - 7.450   pCO2 arterial 40.9 32.0 - 48.0 mmHg   pO2, Arterial 157.0 (H) 83.0 - 108.0 mmHg   Bicarbonate 25.2 20.0 - 28.0 mmol/L   TCO2 26 22 - 32 mmol/L   O2 Saturation 99.0 %   Patient temperature HIDE    Sample type ARTERIAL   I-STAT 3, venous blood gas (G3P  V)     Status: Abnormal   Collection Time: 06/12/17 10:46 AM  Result Value Ref Range   pH, Ven 7.336 7.250 - 7.430   pCO2, Ven 38.9 (L) 44.0 - 60.0 mmHg   pO2, Ven 38.0 32.0 - 45.0 mmHg   Bicarbonate 20.8 20.0 - 28.0 mmol/L   TCO2 22 22 - 32 mmol/L   O2 Saturation 69.0 %   Acid-base deficit 5.0 (H) 0.0 - 2.0 mmol/L   Patient temperature HIDE    Sample type VENOUS    Comment NOTIFIED PHYSICIAN   I-STAT 3, venous blood gas (G3P V)     Status: Abnormal   Collection Time: 06/12/17 10:46 AM  Result Value Ref Range   pH, Ven 7.379 7.250 - 7.430   pCO2, Ven 42.2 (L) 44.0 - 60.0 mmHg   pO2, Ven 40.0 32.0 - 45.0 mmHg   Bicarbonate 24.9 20.0 - 28.0 mmol/L   TCO2 26 22 - 32 mmol/L   O2 Saturation 73.0 %   Patient temperature HIDE    Sample type VENOUS   I-STAT 3, venous blood gas (G3P V)     Status: Abnormal   Collection Time: 06/12/17 10:58 AM  Result Value Ref Range   pH, Ven 7.355 7.250 - 7.430   pCO2, Ven 42.3 (L) 44.0 - 60.0 mmHg   pO2, Ven 47.0 (H) 32.0 - 45.0 mmHg   Bicarbonate 23.6 20.0 - 28.0 mmol/L   TCO2 25 22 - 32 mmol/L   O2 Saturation 81.0 %   Acid-base deficit 2.0 0.0 - 2.0 mmol/L   Patient temperature HIDE    Sample type VENOUS   Basic metabolic panel     Status: Abnormal   Collection Time: 06/13/17  6:31 AM  Result Value Ref Range   Sodium 138 135 - 145 mmol/L   Potassium 3.9 3.5 - 5.1 mmol/L   Chloride 105 101 - 111 mmol/L   CO2 25 22 - 32 mmol/L   Glucose, Bld 117 (H) 65 - 99 mg/dL   BUN 15 6 - 20 mg/dL   Creatinine, Ser 0.97 0.61 - 1.24 mg/dL   Calcium 9.0 8.9 - 10.3 mg/dL   GFR calc non Af Amer >60 >60 mL/min   GFR calc Af Amer >60 >60 mL/min    Comment: (NOTE) The eGFR has been calculated  using the CKD EPI equation. This calculation has not been validated in all clinical situations. eGFR's persistently <60 mL/min signify possible Chronic Kidney Disease.    Anion gap 8 5 - 15    ECG   N/A  Telemetry   Sinus rhythm - Personally  Reviewed  Radiology    No results found.  Cardiac Studies   Cath 06/12/17 Normal coronary arteries 2. NICM with EF 25-30% 3. Well-compensated hemodynamics 4. Frequent PVCs  TTE: 05/06/17 - Left ventricle: The cavity size was moderately dilated. Wall   thickness was normal. Systolic function was moderately to   severely reduced. The estimated ejection fraction was in the   range of 30% to 35%. Diffuse hypokinesis. Doppler parameters are   consistent with abnormal left ventricular relaxation (grade 1   diastolic dysfunction). - Ventricular septum: Septal motion showed abnormal function and   dyssynergy. - Mitral valve: Mildly thickened leaflets . There was mild   regurgitation. - Right atrium: Central venous pressure (est): 3 mm Hg. - Atrial septum: No defect or patent foramen ovale was identified. - Tricuspid valve: There was trivial regurgitation. - Pulmonary arteries: PA peak pressure: 36 mm Hg (S). - Pericardium, extracardiac: There was no pericardial effusion.  Impressions:  - Normal LV wall thickness with moderate chamber dilatation and   LVEF approximately 30-35%. There is diffuse hypokinesis with   regional variation and septal dyssynergy. Grade 1 diastolic   dysfunction. Mildly thickened mitral leaflets with mild mitral   regurgitation. Trivial tricuspid regurgitation with PASP   estimated 36 mmHg.  Assessment   Principal Problem:   Acute on chronic respiratory failure with hypoxia (HCC) Active Problems:   Essential hypertension   Asthma exacerbation   Chronic combined systolic (congestive) and diastolic (congestive) heart failure ([place])   Plan   1. 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  2. I will discontinue lisinopril and start Entresto 51/49 mg PO BID tomorrow 3. Start loading with  PO amiodarone 400 mg po BID x 3 days, followed by 200 mg po BID x 3 days followed by 200 mg po daily.  Length of Stay:  LOS: 2 days   Ena Dawley 06/13/2017, 11:29 AM

## 2017-06-13 NOTE — Plan of Care (Signed)
  Progressing Health Behavior/Discharge Planning: Ability to manage health-related needs will improve 06/13/2017 0403 - Progressing by Ardine Eng, RN Clinical Measurements: Cardiovascular complication will be avoided 06/13/2017 0403 - Progressing by Ardine Eng, RN

## 2017-06-14 DIAGNOSIS — I493 Ventricular premature depolarization: Secondary | ICD-10-CM

## 2017-06-14 DIAGNOSIS — I5043 Acute on chronic combined systolic (congestive) and diastolic (congestive) heart failure: Secondary | ICD-10-CM

## 2017-06-14 DIAGNOSIS — J9621 Acute and chronic respiratory failure with hypoxia: Principal | ICD-10-CM

## 2017-06-14 LAB — CBC
HCT: 43.4 % (ref 39.0–52.0)
HEMOGLOBIN: 14.5 g/dL (ref 13.0–17.0)
MCH: 28.6 pg (ref 26.0–34.0)
MCHC: 33.4 g/dL (ref 30.0–36.0)
MCV: 85.6 fL (ref 78.0–100.0)
PLATELETS: 253 10*3/uL (ref 150–400)
RBC: 5.07 MIL/uL (ref 4.22–5.81)
RDW: 15.1 % (ref 11.5–15.5)
WBC: 6.7 10*3/uL (ref 4.0–10.5)

## 2017-06-14 LAB — CREATININE, SERUM
CREATININE: 1.25 mg/dL — AB (ref 0.61–1.24)
GFR calc Af Amer: >60 mL/min (ref >60)
GFR, EST NON AFRICAN AMERICAN: 59 mL/min — AB (ref >60)

## 2017-06-14 MED ORDER — SACUBITRIL-VALSARTAN 24-26 MG PO TABS
1.0000 | ORAL_TABLET | Freq: Two times a day (BID) | ORAL | Status: DC
  Administered 2017-06-14 (×2): 1 via ORAL
  Filled 2017-06-14 (×3): qty 1

## 2017-06-14 MED ORDER — HEPARIN SODIUM (PORCINE) 5000 UNIT/ML IJ SOLN
5000.0000 [IU] | Freq: Three times a day (TID) | INTRAMUSCULAR | Status: DC
  Administered 2017-06-14 – 2017-06-15 (×3): 5000 [IU] via SUBCUTANEOUS
  Filled 2017-06-14 (×3): qty 1

## 2017-06-14 MED ORDER — METOPROLOL TARTRATE 12.5 MG HALF TABLET
12.5000 mg | ORAL_TABLET | Freq: Two times a day (BID) | ORAL | Status: DC
  Administered 2017-06-14 (×2): 12.5 mg via ORAL
  Filled 2017-06-14 (×2): qty 1

## 2017-06-14 NOTE — Progress Notes (Signed)
 DAILY PROGRESS NOTE   Patient Name: Joe Garcia Date of Encounter: 06/14/2017  Patient Profile   Joe Garcia is a 66 y.o. male with a hx of HTN, HLD, Asthma and recent diagnosis of systolic HF in October, who presented to hospital with acute on chronic respiratory failure. Cardiology consulted given new systolic HF, at the request of Dr Joseph, [facility].  Subjective   He feels good today, improved SOB.  Objective   Vitals:   06/13/17 1300 06/13/17 1922 06/13/17 1950 06/14/17 0557  BP: (!) 161/77 114/81  (!) 128/95  Pulse: (!) 53 (!) 51 65 72  Resp: 20 18  18  Temp: 97.7 F (36.5 C) 98.3 F (36.8 C)  (!) 97.5 F (36.4 C)  TempSrc: Oral Oral  Oral  SpO2: 92% 100% 98% 97%  Weight:    153 lb 3.2 oz (69.5 kg)  Height:        Intake/Output Summary (Last 24 hours) at 06/14/2017 0818 Last data filed at 06/14/2017 0500 Gross per 24 hour  Intake 1135 ml  Output 3550 ml  Net -2415 ml   Filed Weights   06/12/17 0500 06/13/17 0606 06/14/17 0557  Weight: 161 lb 6 oz (73.2 kg) 153 lb 3.2 oz (69.5 kg) 153 lb 3.2 oz (69.5 kg)    Physical Exam   General appearance: alert and no distress Neck: no carotid bruit, no JVD and thyroid not enlarged, symmetric, no tenderness/mass/nodules Lungs: diminished breath sounds bibasilar and wheezes bilaterally Heart: regular rate and rhythm Extremities: extremities normal, atraumatic, no cyanosis or edema Neurologic: Grossly normal  Inpatient Medications    Scheduled Meds: . amiodarone  400 mg Oral BID   Followed by  . [START ON 06/16/2017] amiodarone  200 mg Oral BID   Followed by  . [START ON 06/19/2017] amiodarone  200 mg Oral Daily  . docusate sodium  100 mg Oral BID  . doxycycline  100 mg Oral Q12H  . enoxaparin (LOVENOX) injection  40 mg Subcutaneous Q24H  . ipratropium-albuterol  3 mL Nebulization TID  . predniSONE  50 mg Oral Q breakfast  . sacubitril-valsartan  1 tablet Oral BID  . sodium chloride flush   3 mL Intravenous Q12H    Continuous Infusions: . sodium chloride      PRN Meds: sodium chloride, acetaminophen, albuterol, hydrALAZINE, ondansetron **OR** ondansetron (ZOFRAN) IV, ondansetron (ZOFRAN) IV, sodium chloride flush   Labs   Results for orders placed or performed during the hospital encounter of 06/10/17 (from the past 48 hour(s))  I-STAT 3, arterial blood gas (G3+)     Status: Abnormal   Collection Time: 06/12/17 10:40 AM  Result Value Ref Range   pH, Arterial 7.397 7.350 - 7.450   pCO2 arterial 40.9 32.0 - 48.0 mmHg   pO2, Arterial 157.0 (H) 83.0 - 108.0 mmHg   Bicarbonate 25.2 20.0 - 28.0 mmol/L   TCO2 26 22 - 32 mmol/L   O2 Saturation 99.0 %   Patient temperature HIDE    Sample type ARTERIAL   I-STAT 3, venous blood gas (G3P V)     Status: Abnormal   Collection Time: 06/12/17 10:46 AM  Result Value Ref Range   pH, Ven 7.336 7.250 - 7.430   pCO2, Ven 38.9 (L) 44.0 - 60.0 mmHg   pO2, Ven 38.0 32.0 - 45.0 mmHg   Bicarbonate 20.8 20.0 - 28.0 mmol/L   TCO2 22 22 - 32 mmol/L   O2 Saturation 69.0 %   Acid-base deficit   5.0 (H) 0.0 - 2.0 mmol/L   Patient temperature HIDE    Sample type VENOUS    Comment NOTIFIED PHYSICIAN   I-STAT 3, venous blood gas (G3P V)     Status: Abnormal   Collection Time: 06/12/17 10:46 AM  Result Value Ref Range   pH, Ven 7.379 7.250 - 7.430   pCO2, Ven 42.2 (L) 44.0 - 60.0 mmHg   pO2, Ven 40.0 32.0 - 45.0 mmHg   Bicarbonate 24.9 20.0 - 28.0 mmol/L   TCO2 26 22 - 32 mmol/L   O2 Saturation 73.0 %   Patient temperature HIDE    Sample type VENOUS   I-STAT 3, venous blood gas (G3P V)     Status: Abnormal   Collection Time: 06/12/17 10:58 AM  Result Value Ref Range   pH, Ven 7.355 7.250 - 7.430   pCO2, Ven 42.3 (L) 44.0 - 60.0 mmHg   pO2, Ven 47.0 (H) 32.0 - 45.0 mmHg   Bicarbonate 23.6 20.0 - 28.0 mmol/L   TCO2 25 22 - 32 mmol/L   O2 Saturation 81.0 %   Acid-base deficit 2.0 0.0 - 2.0 mmol/L   Patient temperature HIDE    Sample  type VENOUS   Basic metabolic panel     Status: Abnormal   Collection Time: 06/13/17  6:31 AM  Result Value Ref Range   Sodium 138 135 - 145 mmol/L   Potassium 3.9 3.5 - 5.1 mmol/L   Chloride 105 101 - 111 mmol/L   CO2 25 22 - 32 mmol/L   Glucose, Bld 117 (H) 65 - 99 mg/dL   BUN 15 6 - 20 mg/dL   Creatinine, Ser 0.97 0.61 - 1.24 mg/dL   Calcium 9.0 8.9 - 10.3 mg/dL   GFR calc non Af Amer >60 >60 mL/min   GFR calc Af Amer >60 >60 mL/min    Comment: (NOTE) The eGFR has been calculated using the CKD EPI equation. This calculation has not been validated in all clinical situations. eGFR's persistently <60 mL/min signify possible Chronic Kidney Disease.    Anion gap 8 5 - 15    ECG   N/A  Telemetry   Sinus rhythm - Personally Reviewed  Radiology    No results found.  Cardiac Studies   Cath 06/12/17 Normal coronary arteries 2. NICM with EF 25-30% 3. Well-compensated hemodynamics 4. Frequent PVCs  TTE: 05/06/17 - Left ventricle: The cavity size was moderately dilated. Wall   thickness was normal. Systolic function was moderately to   severely reduced. The estimated ejection fraction was in the   range of 30% to 35%. Diffuse hypokinesis. Doppler parameters are   consistent with abnormal left ventricular relaxation (grade 1   diastolic dysfunction). - Ventricular septum: Septal motion showed abnormal function and   dyssynergy. - Mitral valve: Mildly thickened leaflets . There was mild   regurgitation. - Right atrium: Central venous pressure (est): 3 mm Hg. - Atrial septum: No defect or patent foramen ovale was identified. - Tricuspid valve: There was trivial regurgitation. - Pulmonary arteries: PA peak pressure: 36 mm Hg (S). - Pericardium, extracardiac: There was no pericardial effusion.  Impressions:  - Normal LV wall thickness with moderate chamber dilatation and   LVEF approximately 30-35%. There is diffuse hypokinesis with   regional variation and septal  dyssynergy. Grade 1 diastolic   dysfunction. Mildly thickened mitral leaflets with mild mitral   regurgitation. Trivial tricuspid regurgitation with PASP   estimated 36 mmHg.  Assessment   Principal Problem:  Acute on chronic respiratory failure with hypoxia (HCC) Active Problems:   Essential hypertension   Asthma exacerbation   Chronic combined systolic (congestive) and diastolic (congestive) heart failure (HCC)   Nonischemic cardiomyopathy (HCC)   Multifocal PVCs   Plan   1. 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  - significant diuresis with no diuretics, weight down to 153 lbs, he appears euvolemic - he was started on amiodarone load yesterday with decreased amount of PVCs on telemetry. I will add a low dose metoprolol 12.5 mg PO BID.  2. I discontinued lisinopril yesterday and will start Entresto 26/24 mg PO BID today 3. Continue loading with PO amiodarone 400 mg po BID x 3 days, followed by 200 mg po BID x 3 days followed by 200 mg po daily.  Length of Stay:  LOS: 3 days   Katarina Nelson 06/14/2017, 8:18 AM 

## 2017-06-14 NOTE — Consult Note (Signed)
Advanced Heart Failure Team Consult Note   Primary Physician: Primary Cardiologist:  Dr Gwenlyn Found   Reason for Consultation: Heart Failure   HPI:    Joe Garcia is seen today for evaluation of heart failure at the request of Dr Meda Coffee.   Joe Garcia is 66 year old with a h/o HTN, hyperlipidemia, asthma, recently diagnosed systolic heart failure (October 2018). Admitted with chest pain and increased dyspnea.Presented with increased WOB and wheezing. In the ED he received steroids and nebs.  Pertinent admission labs include troponin 0.0, bnp 298, K 3.5 and creatinine 1.02. On Friday had RHC/LHC as noted below. During cath frequent PVCs identified as well as later on the monitor. Amiodarone was loaded to suppress PVCs. HF meds have been optimized with addition of entresto.   Today he if feeling better. Mild dyspnea walking.   RHC/LHC 06/12/3017  RA = 4 RV = 38/7 PA = 36/14 (25) PCW = 15 Fick cardiac output/index = 5.4/2.8 PVR = 1.8 WU SVR 1595 Ao sat = 99% PA sat = 69%, 73% Assessment: 1. Normal coronary arteries 2. NICM with EF 25-30% 3. Well-compensated hemodynamics 4. Frequent PVCs  Echo 05/06/2017 EF 30-35%   Review of Systems: [y] = yes, [ ]  = no   General: Weight gain [ ] ; Weight loss [ ] ; Anorexia [ ] ; Fatigue [ ] ; Fever [ ] ; Chills [ ] ; Weakness [ ]   Cardiac: Chest pain/pressure Blue.Reese ]; Resting SOB Blue.Reese ]; Exertional SOB [Y ]; Orthopnea [ ] ; Pedal Edema [ ] ; Palpitations [ ] ; Syncope [ ] ; Presyncope [ ] ; Paroxysmal nocturnal dyspnea[ ]   Pulmonary: Cough [ ] ; Wheezing[ ] ; Hemoptysis[ ] ; Sputum [ ] ; Snoring [ ]   GI: Vomiting[ ] ; Dysphagia[ ] ; Melena[ ] ; Hematochezia [ ] ; Heartburn[ ] ; Abdominal pain [ ] ; Constipation [ ] ; Diarrhea [ ] ; BRBPR [ ]   GU: Hematuria[ ] ; Dysuria [ ] ; Nocturia[ ]   Vascular: Pain in legs with walking [ ] ; Pain in feet with lying flat [ ] ; Non-healing sores [ ] ; Stroke [ ] ; TIA [ ] ; Slurred speech [ ] ;  Neuro: Headaches[ ] ; Vertigo[ ] ; Seizures[  ]; Paresthesias[ ] ;Blurred vision [ ] ; Diplopia [ ] ; Vision changes [ ]   Ortho/Skin: Arthritis Blue.Reese ]; Joint pain [ Y]; Muscle pain [ ] ; Joint swelling [ ] ; Back Pain [ Y]; Rash [ ]   Psych: Depression[ ] ; Anxiety[ ]   Heme: Bleeding problems [ ] ; Clotting disorders [ ] ; Anemia [ ]   Endocrine: Diabetes [ ] ; Thyroid dysfunction[ ]   Home Medications Prior to Admission medications   Medication Sig Start Date End Date Taking? Authorizing Provider  albuterol (PROVENTIL HFA;VENTOLIN HFA) 108 (90 BASE) MCG/ACT inhaler Inhale 1-2 puffs into the lungs every 6 (six) hours as needed for wheezing or shortness of breath. 07/12/15  Yes Barrett, Lahoma Crocker, PA-C  albuterol (PROVENTIL) (2.5 MG/3ML) 0.083% nebulizer solution Take 3 mLs (2.5 mg total) by nebulization every 6 (six) hours as needed for wheezing or shortness of breath. 10/16/15  Yes Kirichenko, Tatyana, PA-C  lisinopril (PRINIVIL,ZESTRIL) 10 MG tablet Take 1 tablet (10 mg total) by mouth daily. 05/06/17 05/06/18 Yes Thomasene Ripple, MD    Past Medical History: Past Medical History:  Diagnosis Date  . Alcoholism ([place])   . Arthritis    hips, L shoulder  . Asthma   . CHF (congestive heart failure) ([place])   . Depression   . Family history of anesthesia complication   . Hyperlipidemia   . Hypertension     Past Surgical  History: Past Surgical History:  Procedure Laterality Date  . RIGHT/LEFT HEART CATH AND CORONARY ANGIOGRAPHY N/A 06/12/2017   Performed by Jolaine Artist, MD at [facility] INVASIVE CV LAB    Family History: Family History  Problem Relation Age of Onset  . Heart disease Father   . Heart disease Daughter        "fluid around heart"     Social History: Social History   Socioeconomic History  . Marital status: Single    Spouse name: None  . Number of children: None  . Years of education: None  . Highest education level: None  Social Needs  . Financial resource strain: None  . Food insecurity - worry: None  . Food  insecurity - inability: None  . Transportation needs - medical: None  . Transportation needs - non-medical: None  Occupational History  . Occupation: "it don't work for me", on disability  Tobacco Use  . Smoking status: Former Smoker    Packs/day: 0.10    Years: 45.00    Pack years: 4.50    Last attempt to quit: 2012    Years since quitting: 6.8  . Smokeless tobacco: Never Used  Substance and Sexual Activity  . Alcohol use: No    Frequency: Never    Comment: Pt states no etoh since April 2014  . Drug use: No    Comment: Prior use of crack cocaine, quit 2012  . Sexual activity: No  Other Topics Concern  . None  Social History Narrative  . None    Allergies:  Allergies  Allergen Reactions  . Penicillins     REACTION: "stomach pains"    Objective:    Vital Signs:   Temp:  [97.5 F (36.4 C)-97.8 F (36.6 C)] 97.8 F (36.6 C) (11/19 0550) Pulse Rate:  [60-70] 62 (11/19 0550) Resp:  [18] 18 (11/19 0550) BP: (128-155)/(75-93) 128/75 (11/19 0550) SpO2:  [94 %-98 %] 98 % (11/19 0550) Weight:  [153 lb 11.2 oz (69.7 kg)] 153 lb 11.2 oz (69.7 kg) (11/19 0550) Last BM Date: 06/14/17  Weight change: Filed Weights   06/13/17 0606 06/14/17 0557 06/15/17 0550  Weight: 153 lb 3.2 oz (69.5 kg) 153 lb 3.2 oz (69.5 kg) 153 lb 11.2 oz (69.7 kg)    Intake/Output:   Intake/Output Summary (Last 24 hours) at 06/15/2017 0745 Last data filed at 06/14/2017 2154 Gross per 24 hour  Intake 960 ml  Output 1650 ml  Net -690 ml      Physical Exam    General:  Well appearing. No resp difficulty. Sitting on the side of the bed.  HEENT: normal x for poor dentition Neck: supple. JVP 7 . Carotids 2+ bilat; no bruits. No lymphadenopathy or thyromegaly appreciated. Cor: PMI laterally displaced. Regular rate & rhythm with ectopy. No rubs, gallops or murmurs. Lungs: clear Abdomen: soft, nontender, nondistended. No hepatosplenomegaly. No bruits or masses. Good bowel sounds. Extremities: no  cyanosis, clubbing, rash, edema Neuro: alert & orientedx3, cranial nerves grossly intact. moves all 4 extremities w/o difficulty. Affect pleasant   Telemetry   Sinus Rhythm with PVCs 60-70s    EKG    N/A  Labs   Basic Metabolic Panel: Recent Labs  Lab 06/10/17 1710 06/11/17 0608 06/12/17 0400 06/13/17 0631 06/14/17 1419  NA 136 136 138 138  --   K 3.5 3.8 4.1 3.9  --   CL 102 105 108 105  --   CO2 23 20* 22 25  --  GLUCOSE 88 233* 156* 117*  --   BUN 12 12 16 15   --   CREATININE 1.02 1.13 1.15 0.97 1.25*  CALCIUM 9.0 8.5* 9.1 9.0  --     Liver Function Tests: No results for input(s): AST, ALT, ALKPHOS, BILITOT, PROT, ALBUMIN in the last 168 hours. No results for input(s): LIPASE, AMYLASE in the last 168 hours. No results for input(s): AMMONIA in the last 168 hours.  CBC: Recent Labs  Lab 06/10/17 1710 06/11/17 0608 06/12/17 0400 06/14/17 1419  WBC 8.8 4.0 7.5 6.7  HGB 13.6 12.3* 12.3* 14.5  HCT 40.9 37.7* 37.4* 43.4  MCV 84.7 84.3 84.6 85.6  PLT 282 253 247 253    Cardiac Enzymes: No results for input(s): CKTOTAL, CKMB, CKMBINDEX, TROPONINI in the last 168 hours.  BNP: BNP (last 3 results) Recent Labs    05/04/17 0859 06/10/17 1710  BNP 509.8* 298.6*    ProBNP (last 3 results) No results for input(s): PROBNP in the last 8760 hours.   CBG: No results for input(s): GLUCAP in the last 168 hours.  Coagulation Studies: No results for input(s): LABPROT, INR in the last 72 hours.   Imaging   No results found.   Medications:     Current Medications: . amiodarone  400 mg Oral BID   Followed by  . [START ON 06/16/2017] amiodarone  200 mg Oral BID   Followed by  . [START ON 06/19/2017] amiodarone  200 mg Oral Daily  . docusate sodium  100 mg Oral BID  . doxycycline  100 mg Oral Q12H  . heparin  5,000 Units Subcutaneous Q8H  . ipratropium-albuterol  3 mL Nebulization TID  . metoprolol tartrate  12.5 mg Oral BID  . predniSONE  50 mg Oral  Q breakfast  . sacubitril-valsartan  1 tablet Oral BID  . sodium chloride flush  3 mL Intravenous Q12H    Infusions: . sodium chloride        Patient Profile   Joe Garcia is 66 year old with a h/o HTN, hyperlipidemia, asthma, recently diagnosed systolic heart failure (October 2018). Admitted with chest pain and increased dyspnea.  Assessment/Plan  1. Acute Hypoxic Respiratory Failure Dyspnea in the setting of asthma exacerbation On doxycycline, steroids, and nebs.  2. Chronic Systolic Heart Failure. ECHO 05/06/2017 EF 30-35%. NICM. 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 Well compensated hemodynamics.  Volume status stable. Does not need diuretics.   - Stop metoprolol tartrate. Start carvedilol 3.125 mg twice a day - Increase entresto to 49-51 mg twice a day.  - Will add spiro as an outpatient.   3. PVCs Loading on amio. Continue 400 mg twice a day x7 days, then cut back to 200 mg twice a day 4. HTN - Goal SBP < 130.  Continue current regimen with addition of carvedilol and increasing entresto..   Medication concerns reviewed with patient and pharmacy team. Barriers identified: No has medicaid.  Potential barrier --> transportation. Rides bus.   HF meds for d/c Carvedilol 3.125 mg twice a day Entresto 49-51 mg twice a day Amiodarone 400 mg twice a day x7 days then amiodarone 200 mg twice a day.   Follow up in HF clinic 7-10 days.   Dr [name] to evaluate this morning.   Length of Stay: 4  Amy Clegg, NP  06/15/2017, 7:45 AM  Advanced Heart Failure Team Pager [phone] (M-F; 7a - 4p)  Please contact [facility] for night-coverage after hours (4p -7a ) and  weekends on [email or web address]  Patient seen and examined with the above-signed Advanced Practice Provider and/or Housestaff. I personally reviewed laboratory data, imaging studies and relevant notes. I independently examined the patient and formulated the important aspects of the plan. I have edited the  note to reflect any of my changes or salient points. I have personally discussed the plan with the patient and/or family.  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 Cath results and echo reviewed with him. Overall much improved but still having about 15% PVCs on tele. Will continue amio. Volume status looks good. Walking halls. Can go home today with close f/u in HF Clinic. Discussed with PharmD as Medicaid so can afford meds.   Glori Bickers, MD  11:54 AM

## 2017-06-14 NOTE — Progress Notes (Signed)
PROGRESS NOTE    Joe Garcia  SEG:[id number] DOB: 07/12/1951 DOA: 06/10/2017 PCP: Medicine, [facility]     Brief Narrative:  Joe Garcia is a 66 yo male with past medical history significant for hypertension, hyperlipidemia, combined systolic and diastolic heart failure with EF 73-71%, grade 1 diastolic dysfunction, asthma who presented with shortness of breath.  He admitted to productive cough, wheezing, chills.  He was admitted for acute on chronic respiratory failure primarily due to asthma exacerbation as well as acute on chronic congestive heart failure.  Assessment & Plan:   Principal Problem:   Acute on chronic respiratory failure with hypoxia (HCC) Active Problems:   Essential hypertension   Asthma exacerbation   Chronic combined systolic (congestive) and diastolic (congestive) heart failure (HCC)   Nonischemic cardiomyopathy (HCC)   Multifocal PVCs   Acute on chronic combined systolic and diastolic CHF (congestive heart failure) (HCC)   Acute on chronic respiratory failure with hypoxia secondary to acute moderate persistent asthma exacerbation as well as CHF as below  - Hypoxia due to combination of asthma and decompensated CHF but appears euvolemic at this point - Continue doxycycline empirically - Continue Duoneb TID scheduled and albuterol every 4 hours PRN shortness of breath or wheezing  - Continue prednisone - On room air   Acute combined systolic and diastolic CHF / 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  - BNP on admission 298 (BNP about 1 month ago 509) - Echo on 10/10 showed EF 30-35% with grade 1 diastolic dysfunction  - Cath on this admission showed similar EF 25-30%, normal coronaries, frequent PVC's - Appreciate cardiology consultation - Started on Entresto, amiodarone, metoprolol  Essential hypertension - Stop lisinopril, start Entresto, metoprolol   Steroid induced hyperglycemia - Blood sugar well controlled this  morning   Tobacco abuse - Counseled on smoking cessation     DVT prophylaxis: subq hep Code Status: full Family Communication: no family at bedside Disposition Plan: Discharge when cleared by cardiology, hopefully tomorrow   Consultants:   Cardiology  Procedures:   Heart cath  Antimicrobials:  Anti-infectives (From admission, onward)   Start     Dose/Rate Route Frequency Ordered Stop   06/11/17 0330  doxycycline (VIBRA-TABS) tablet 100 mg     100 mg Oral Every 12 hours 06/11/17 0327     06/10/17 2330  doxycycline (VIBRA-TABS) tablet 100 mg     100 mg Oral  Once 06/10/17 2321 06/11/17 0020       Subjective: Patient feeling well this morning.  Has no complaints.  Denies any chest pain.  Objective: Vitals:   06/13/17 1950 06/14/17 0557 06/14/17 0853 06/14/17 1215  BP:  (!) 128/95  (!) 155/93  Pulse: 65 72  60  Resp:  18  18  Temp:  (!) 97.5 F (36.4 C)  (!) 97.5 F (36.4 C)  TempSrc:  Oral  Oral  SpO2: 98% 97% 96% 96%  Weight:  69.5 kg (153 lb 3.2 oz)    Height:        Intake/Output Summary (Last 24 hours) at 06/14/2017 1258 Last data filed at 06/14/2017 1217 Gross per 24 hour  Intake 960 ml  Output 3550 ml  Net -2590 ml   Filed Weights   06/12/17 0500 06/13/17 0606 06/14/17 0557  Weight: 73.2 kg (161 lb 6 oz) 69.5 kg (153 lb 3.2 oz) 69.5 kg (153 lb 3.2 oz)    Examination:  General exam: Appears calm and comfortable  Respiratory system: Clear to  auscultation. Respiratory effort normal. Cardiovascular system: S1 & S2 heard, RRR. No JVD, murmurs, rubs, gallops or clicks. No pedal edema. Gastrointestinal system: Abdomen is nondistended, soft and nontender. No organomegaly or masses felt. Normal bowel sounds heard. Central nervous system: Alert and oriented. No focal neurological deficits. Extremities: Symmetric 5 x 5 power. Skin: No rashes, lesions or ulcers Psychiatry: Judgement and insight appear normal. Mood & affect appropriate.   Data Reviewed:  I have personally reviewed following labs and imaging studies  CBC: Recent Labs  Lab 06/10/17 1710 06/11/17 0608 06/12/17 0400  WBC 8.8 4.0 7.5  HGB 13.6 12.3* 12.3*  HCT 40.9 37.7* 37.4*  MCV 84.7 84.3 84.6  PLT 282 253 626   Basic Metabolic Panel: Recent Labs  Lab 06/10/17 1710 06/11/17 0608 06/12/17 0400 06/13/17 0631  NA 136 136 138 138  K 3.5 3.8 4.1 3.9  CL 102 105 108 105  CO2 23 20* 22 25  GLUCOSE 88 233* 156* 117*  BUN 12 12 16 15   CREATININE 1.02 1.13 1.15 0.97  CALCIUM 9.0 8.5* 9.1 9.0   GFR: Estimated Creatinine Clearance: 75.6 mL/min (by C-G formula based on SCr of 0.97 mg/dL). Liver Function Tests: No results for input(s): AST, ALT, ALKPHOS, BILITOT, PROT, ALBUMIN in the last 168 hours. No results for input(s): LIPASE, AMYLASE in the last 168 hours. No results for input(s): AMMONIA in the last 168 hours. Coagulation Profile: Recent Labs  Lab 06/12/17 0400  INR 1.00   Cardiac Enzymes: No results for input(s): CKTOTAL, CKMB, CKMBINDEX, TROPONINI in the last 168 hours. BNP (last 3 results) No results for input(s): PROBNP in the last 8760 hours. HbA1C: No results for input(s): HGBA1C in the last 72 hours. CBG: No results for input(s): GLUCAP in the last 168 hours. Lipid Profile: No results for input(s): CHOL, HDL, LDLCALC, TRIG, CHOLHDL, LDLDIRECT in the last 72 hours. Thyroid Function Tests: No results for input(s): TSH, T4TOTAL, FREET4, T3FREE, THYROIDAB in the last 72 hours. Anemia Panel: No results for input(s): VITAMINB12, FOLATE, FERRITIN, TIBC, IRON, RETICCTPCT in the last 72 hours. Sepsis Labs: No results for input(s): PROCALCITON, LATICACIDVEN in the last 168 hours.  No results found for this or any previous visit (from the past 240 hour(s)).     Radiology Studies: No results found.    Scheduled Meds: . amiodarone  400 mg Oral BID   Followed by  . [START ON 06/16/2017] amiodarone  200 mg Oral BID   Followed by  . [START ON  06/19/2017] amiodarone  200 mg Oral Daily  . docusate sodium  100 mg Oral BID  . doxycycline  100 mg Oral Q12H  . heparin  5,000 Units Subcutaneous Q8H  . ipratropium-albuterol  3 mL Nebulization TID  . metoprolol tartrate  12.5 mg Oral BID  . predniSONE  50 mg Oral Q breakfast  . sacubitril-valsartan  1 tablet Oral BID  . sodium chloride flush  3 mL Intravenous Q12H   Continuous Infusions: . sodium chloride       LOS: 3 days    Time spent: 40 minutes   Dessa Phi, DO [facility] [email or web address] Password [email or web address] 06/14/2017, 12:58 PM

## 2017-06-15 LAB — BASIC METABOLIC PANEL
ANION GAP: 7 (ref 5–15)
BUN: 16 mg/dL (ref 6–20)
CHLORIDE: 106 mmol/L (ref 101–111)
CO2: 25 mmol/L (ref 22–32)
Calcium: 8.7 mg/dL — ABNORMAL LOW (ref 8.9–10.3)
Creatinine, Ser: 1.08 mg/dL (ref 0.61–1.24)
GFR calc Af Amer: >60 mL/min (ref >60)
GFR calc non Af Amer: >60 mL/min (ref >60)
GLUCOSE: 91 mg/dL (ref 65–99)
POTASSIUM: 3.3 mmol/L — AB (ref 3.5–5.1)
SODIUM: 138 mmol/L (ref 135–145)

## 2017-06-15 LAB — MAGNESIUM: MAGNESIUM: 1.7 mg/dL (ref 1.7–2.4)

## 2017-06-15 MED ORDER — MAGNESIUM SULFATE 4 GM/100ML IV SOLN
4.0000 g | Freq: Once | INTRAVENOUS | Status: AC
  Administered 2017-06-15: 4 g via INTRAVENOUS
  Filled 2017-06-15: qty 100

## 2017-06-15 MED ORDER — AMIODARONE HCL 200 MG PO TABS: ORAL_TABLET | ORAL | 0 refills | Status: DC

## 2017-06-15 MED ORDER — CARVEDILOL 3.125 MG PO TABS: 3.1250 mg | ORAL_TABLET | Freq: Two times a day (BID) | ORAL | 0 refills | Status: DC

## 2017-06-15 MED ORDER — CARVEDILOL 3.125 MG PO TABS
3.1250 mg | ORAL_TABLET | Freq: Two times a day (BID) | ORAL | Status: DC
  Administered 2017-06-15 (×2): 3.125 mg via ORAL
  Filled 2017-06-15 (×2): qty 1

## 2017-06-15 MED ORDER — SACUBITRIL-VALSARTAN 49-51 MG PO TABS
1.0000 | ORAL_TABLET | Freq: Two times a day (BID) | ORAL | Status: DC
  Administered 2017-06-15: 1 via ORAL
  Filled 2017-06-15 (×3): qty 1

## 2017-06-15 MED ORDER — POTASSIUM CHLORIDE CRYS ER 20 MEQ PO TBCR
40.0000 meq | EXTENDED_RELEASE_TABLET | Freq: Once | ORAL | Status: AC
  Administered 2017-06-15: 40 meq via ORAL
  Filled 2017-06-15: qty 2

## 2017-06-15 MED ORDER — SACUBITRIL-VALSARTAN 49-51 MG PO TABS: 1.0000 | ORAL_TABLET | Freq: Two times a day (BID) | ORAL | 0 refills | Status: DC

## 2017-06-15 NOTE — Discharge Instructions (Signed)
Heart Failure °Heart failure means your heart has trouble pumping blood. This makes it hard for your body to work well. Heart failure is usually a long-term (chronic) condition. You must take good care of yourself and follow your doctor's treatment plan. °Follow these instructions at home: °· Take your heart medicine as told by your doctor. °? Do not stop taking medicine unless your doctor tells you to. °? Do not skip any dose of medicine. °? Refill your medicines before they run out. °? Take other medicines only as told by your doctor or pharmacist. °· Stay active if told by your doctor. The elderly and people with severe heart failure should talk with a doctor about physical activity. °· Eat heart-healthy foods. Choose foods that are without trans fat and are low in saturated fat, cholesterol, and salt (sodium). This includes fresh or frozen fruits and vegetables, fish, lean meats, fat-free or low-fat dairy foods, whole grains, and high-fiber foods. Lentils and dried peas and beans (legumes) are also good choices. °· Limit salt if told by your doctor. °· Cook in a healthy way. Roast, grill, broil, bake, poach, steam, or stir-fry foods. °· Limit fluids as told by your doctor. °· Weigh yourself every morning. Do this after you pee (urinate) and before you eat breakfast. Write down your weight to give to your doctor. °· Take your blood pressure and write it down if your doctor tells you to. °· Ask your doctor how to check your pulse. Check your pulse as told. °· Lose weight if told by your doctor. °· Stop smoking or chewing tobacco. Do not use gum or patches that help you quit without your doctor's approval. °· Schedule and go to doctor visits as told. °· Nonpregnant women should have no more than 1 drink a day. Men should have no more than 2 drinks a day. Talk to your doctor about drinking alcohol. °· Stop illegal drug use. °· Stay current with shots (immunizations). °· Manage your health conditions as told by your  doctor. °· Learn to manage your stress. °· Rest when you are tired. °· If it is really hot outside: °? Avoid intense activities. °? Use air conditioning or fans, or get in a cooler place. °? Avoid caffeine and alcohol. °? Wear loose-fitting, lightweight, and light-colored clothing. °· If it is really cold outside: °? Avoid intense activities. °? Layer your clothing. °? Wear mittens or gloves, a hat, and a scarf when going outside. °? Avoid alcohol. °· Learn about heart failure and get support as needed. °· Get help to maintain or improve your quality of life and your ability to care for yourself as needed. °Contact a doctor if: °· You gain weight quickly. °· You are more short of breath than usual. °· You cannot do your normal activities. °· You tire easily. °· You cough more than normal, especially with activity. °· You have any or more puffiness (swelling) in areas such as your hands, feet, ankles, or belly (abdomen). °· You cannot sleep because it is hard to breathe. °· You feel like your heart is beating fast (palpitations). °· You get dizzy or light-headed when you stand up. °Get help right away if: °· You have trouble breathing. °· There is a change in mental status, such as becoming less alert or not being able to focus. °· You have chest pain or discomfort. °· You faint. °This information is not intended to replace advice given to you by your health care provider. Make sure you   discuss any questions you have with your health care provider. °Document Released: 04/22/2008 Document Revised: 12/20/2015 Document Reviewed: 08/30/2012 °Elsevier Interactive Patient Education © 2017 [employer] ° °

## 2017-06-15 NOTE — Progress Notes (Signed)
Occupational Therapy Treatment Patient Details Name: Joe Garcia MRN: [id number] DOB: 03-15-1951 Today's Date: 06/15/2017    History of present illness Joe Garcia is a 66 y.o. male with medical history significant of HTN, HLD, CHF (EF 30-35% with grade 1 diastolic dysfunction in 70/96), and asthma presenting with SOBPt dx with acute on chronic respiratory failure with hypoxia, recent new onset of systolic CHF.  Cardiology has been consulted and wants to do a cardiac cath on him (06/12/17?).     OT comments  Pt demonstrating good progress with ADL independence and understanding of energy conservation strategies. He is able to complete all ADL in hospital setting at modified independent level. Discussed need to implement energy conservation into daily routine and educated pt on strategies to do so. He demonstrates good understanding of all education topics and reports no further questions. No further acute OT needs identified. Will sign off.    Follow Up Recommendations  No OT follow up;Supervision - Intermittent    Equipment Recommendations  None recommended by OT    Recommendations for Other Services      Precautions / Restrictions Precautions Precautions: Other (comment) Precaution Comments: monitor breathing.  Restrictions Weight Bearing Restrictions: No       Mobility Bed Mobility Overal bed mobility: Independent                Transfers Overall transfer level: Modified independent Equipment used: None                  Balance Overall balance assessment: No apparent balance deficits (not formally assessed)                                         ADL either performed or assessed with clinical judgement   ADL Overall ADL's : Modified independent                                       General ADL Comments: Able to complete with increased time. Good understanding of need for rest breaks with SOB. Pt educated on  energy conservation strategies for showering and to take breaks throughout the day. Explained need to view energy as one box for the entire day and techniques to ensure energy is conserved for important tasks.      Vision       Perception     Praxis      Cognition Arousal/Alertness: Awake/alert Behavior During Therapy: WFL for tasks assessed/performed Overall Cognitive Status: Within Functional Limits for tasks assessed                                 General Comments: Reviewed medication management strategies and energy conservation. Pt demonstrates good safety awareness and judgement.         Exercises     Shoulder Instructions       General Comments      Pertinent Vitals/ Pain       Pain Assessment: Faces Faces Pain Scale: No hurt  Home Living  Prior Functioning/Environment              Frequency  Min 2X/week        Progress Toward Goals  OT Goals(current goals can now be found in the care plan section)  Progress towards OT goals: Goals met/education completed, patient discharged from OT  Acute Rehab OT Goals Patient Stated Goal: to go home and stay out of here OT Goal Formulation: With patient Time For Goal Achievement: 06/11/17 Potential to Achieve Goals: Good  Plan All goals met and education completed, patient discharged from OT services    Co-evaluation                 AM-PAC PT "6 Clicks" Daily Activity     Outcome Measure   Help from another person eating meals?: None Help from another person taking care of personal grooming?: None Help from another person toileting, which includes using toliet, bedpan, or urinal?: None Help from another person bathing (including washing, rinsing, drying)?: None Help from another person to put on and taking off regular upper body clothing?: None Help from another person to put on and taking off regular lower body clothing?:  None 6 Click Score: 24    End of Session    OT Visit Diagnosis: Muscle weakness (generalized) (M62.81)   Activity Tolerance Patient tolerated treatment well   Patient Left with call bell/phone within reach(in room ambulating to bathroom)   Nurse Communication          Time: 1610-9604 OT Time Calculation (min): 8 min  Charges: OT General Charges $OT Visit: 1 Visit OT Treatments $Self Care/Home Management : 8-22 mins  Norman Herrlich, MS OTR/L  Pager: [place] A Shelita Steptoe 06/15/2017, 8:37 AM

## 2017-06-15 NOTE — Progress Notes (Signed)
Physical Therapy Treatment Patient Details Name: Joe Garcia MRN: [id number] DOB: 15-Jan-1951 Today's Date: 06/15/2017    History of Present Illness Joe Garcia is a 66 y.o. male with medical history significant of HTN, HLD, CHF (EF 30-35% with grade 1 diastolic dysfunction in 56/38), and asthma presenting with SOBPt dx with acute on chronic respiratory failure with hypoxia, recent new onset of systolic CHF.  Cardiology has been consulted and wants to do a cardiac cath on him (06/12/17?).      PT Comments    Pt was able to follow instructions for all strengthening with no excessive SOB, and noted his LE strength is limited but gives a consistent effort.  He has no plans for PT follow up but may be a candidate for cardiac rehab if he does not continue to progress at home. Follow acutely for gait and strengthening as tolerated.   Follow Up Recommendations  No PT follow up     Equipment Recommendations  None recommended by PT    Recommendations for Other Services       Precautions / Restrictions Precautions Precautions: Other (comment) Precaution Comments: monitor breathing.  Restrictions Weight Bearing Restrictions: No    Mobility  Bed Mobility Overal bed mobility: Independent                Transfers Overall transfer level: Modified independent Equipment used: None                Ambulation/Gait             General Gait Details: declined as mobility tech has just completed visit   Stairs            Wheelchair Mobility    Modified Rankin (Stroke Patients Only)       Balance Overall balance assessment: No apparent balance deficits (not formally assessed)                                          Cognition Arousal/Alertness: Awake/alert Behavior During Therapy: WFL for tasks assessed/performed Overall Cognitive Status: Within Functional Limits for tasks assessed                                         Exercises General Exercises - Lower Extremity Ankle Circles/Pumps: AROM;Both;5 reps Long Arc Quad: Strengthening;Both;20 reps Heel Slides: Strengthening;Both;20 reps Hip ABduction/ADduction: Strengthening;Both;10 reps Hip Flexion/Marching: AROM;Both;10 reps    General Comments        Pertinent Vitals/Pain Pain Assessment: Faces Pain Score: 0-No pain    Home Living                      Prior Function            PT Goals (current goals can now be found in the care plan section) Acute Rehab PT Goals Patient Stated Goal: go home and get back to work Progress towards PT goals: Progressing toward goals    Frequency    Min 3X/week      PT Plan Current plan remains appropriate    Co-evaluation              AM-PAC PT "6 Clicks" Daily Activity  Outcome Measure  Difficulty turning over in bed (including adjusting bedclothes, sheets and blankets)?: None Difficulty moving  from lying on back to sitting on the side of the bed? : None Difficulty sitting down on and standing up from a chair with arms (e.g., wheelchair, bedside commode, etc,.)?: None Help needed moving to and from a bed to chair (including a wheelchair)?: None Help needed walking in hospital room?: None Help needed climbing 3-5 steps with a railing? : None 6 Click Score: 24    End of Session   Activity Tolerance: Patient tolerated treatment well Patient left: in bed;with call bell/phone within reach(sitting side of bed) Nurse Communication: Mobility status PT Visit Diagnosis: Muscle weakness (generalized) (M62.81);Pain;Other (comment) Pain - Right/Left: Right Pain - part of body: Hip     Time: 5638-7564 PT Time Calculation (min) (ACUTE ONLY): 19 min  Charges:  $Therapeutic Exercise: 8-22 mins                    G Codes:        Ramond Dial 2017/06/22, 12:41 PM  Mee Hives, PT MS Acute Rehab Dept. Number: [place] [facility] [place]

## 2017-06-15 NOTE — Progress Notes (Signed)
Entresto coupon card given to pt at discharge.

## 2017-06-15 NOTE — Progress Notes (Signed)
Brilinta coupon card given to patient with explanation of usage; B Pennie Rushing [phone]

## 2017-06-15 NOTE — Discharge Summary (Signed)
Physician Discharge Summary  [name] PPJ:[id number] DOB: [birth year] DOA: 06/10/2017  PCP: [facility]  Admit date: 06/10/2017 Discharge date: 06/15/2017  Admitted From: Home Disposition:  Home  Recommendations for Outpatient Follow-up:  1. Follow up with PCP in 1 week 2. Follow up with heart failure clinic in 7-10 days 3. Please obtain BMP in 1 week  Discharge Condition: Stable CODE STATUS: Full  Diet recommendation: Heart healthy   Brief/Interim Summary: Joe Garcia is a 66 yo male with past medical history significant for hypertension, hyperlipidemia, combined systolic and diastolic heart failure with EF 58-09%, grade 1 diastolic dysfunction, asthma who presented with shortness of breath.  He admitted to productive cough, wheezing, chills.  He was admitted for acute on chronic respiratory failure primarily due to asthma exacerbation as well as acute on chronic congestive heart failure.  Discharge Diagnoses:  Principal Problem:   Acute on chronic respiratory failure with hypoxia (HCC) Active Problems:   Essential hypertension   Asthma exacerbation   Chronic combined systolic (congestive) and diastolic (congestive) heart failure (HCC)   Nonischemic cardiomyopathy (HCC)   Multifocal PVCs   Acute on chronic combined systolic and diastolic CHF (congestive heart failure) (HCC)   Acute on chronic respiratory failure with hypoxia secondary to acute moderatepersistentasthma exacerbation as well as CHF as below  - Hypoxia due to combination of asthma and decompensated CHF but appears euvolemic at this point -Treated with prednisone and doxycycline empirically for 5 days.  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   Essential hypertension - Stop lisinopril, start Entresto, carvedilol    Steroid induced hyperglycemia - Blood sugar well controlled this morning   Tobacco abuse - Counseled on smoking cessation     Discharge Instructions  Discharge Instructions    (HEART FAILURE PATIENTS) Call MD:  Anytime you have any of the following symptoms: 1) 3 pound weight gain in 24 hours or 5 pounds in 1 week 2) shortness of breath, with or without a dry hacking cough 3) swelling in the hands, feet or stomach 4) if you have to sleep on extra pillows at night in order to breathe.   Complete by:  As directed    Call MD for:  difficulty breathing, headache or visual disturbances   Complete by:  As directed    Call MD for:  extreme fatigue   Complete by:  As directed    Call MD for:  hives   Complete by:  As directed    Call MD for:  persistant dizziness or light-headedness   Complete by:  As directed    Call MD for:  persistant nausea and vomiting   Complete by:  As directed    Call MD for:  severe uncontrolled pain   Complete by:  As directed    Call MD for:  temperature >100.4   Complete by:  As directed    Diet -  low sodium heart healthy   Complete by:  As directed    Discharge instructions   Complete by:  As directed    You were cared for by a hospitalist during your hospital stay. If you have any questions about your discharge medications or the care you received while you were in the hospital after you are discharged, you can call the unit and asked to speak with the hospitalist on call if the hospitalist that took care of you is not available. Once you are  discharged, your primary care physician will handle any further medical issues. Please note that NO REFILLS for any discharge medications will be authorized once you are discharged, as it is imperative that you return to your primary care physician (or establish a relationship with a primary care physician if you do not have one) for your aftercare needs so that they can reassess your need for medications and monitor your lab values.   Increase activity slowly   Complete by:  As directed      Allergies as of 06/15/2017      Reactions   Penicillins    REACTION: "stomach pains"      Medication List    STOP taking these medications   lisinopril 10 MG tablet Commonly known as:  PRINIVIL,ZESTRIL     TAKE these medications   albuterol 108 (90 Base) MCG/ACT inhaler Commonly known as:  PROVENTIL HFA;VENTOLIN HFA Inhale 1-2 puffs into the lungs every 6 (six) hours as needed for wheezing or shortness of breath.   albuterol (2.5 MG/3ML) 0.083% nebulizer solution Commonly known as:  PROVENTIL Take 3 mLs (2.5 mg total) by nebulization every 6 (six) hours as needed for wheezing or shortness of breath.   amiodarone 200 MG tablet Commonly known as:  PACERONE Take 2 tablets (400 mg total) 2 (two) times daily for 7 days by mouth, THEN 1 tablet (200 mg total) 2 (two) times daily for 7 days. Start taking on:  06/15/2017   carvedilol 3.125 MG tablet Commonly known as:  COREG Take 1 tablet (3.125 mg total) 2 (two) times daily with a meal by mouth.   sacubitril-valsartan 49-51 MG Commonly known as:  ENTRESTO Take 1 tablet 2 (two) times daily by mouth.       Allergies  Allergen Reactions  . Penicillins     REACTION: "stomach pains"    Consultations:  Cardiology    Procedures/Studies: Dg Chest 2 View  Result Date: 06/10/2017 CLINICAL DATA:  Shortness of breath 3 days.  History of asthma. EXAM: CHEST  2 VIEW COMPARISON:  05/04/2017 and 10/29/2016 . FINDINGS: Lungs are adequately  inflated without focal consolidation or effusion. Cardiomediastinal silhouette is within normal. There is moderate degenerative change of the spine. Mild stable wedging of several adjacent midthoracic vertebral bodies IMPRESSION: No active cardiopulmonary disease. Electronically Signed   By: Marin Olp M.D.   On: 06/10/2017 17:44    Heart cath 11/16 Assessment: 1. Normal coronary arteries 2. NICM with EF 25-30% 3. Well-compensated hemodynamics 4. Frequent PVCs    Discharge Exam: Vitals:   06/15/17 0550 06/15/17 1148  BP: 128/75 (!) 153/81  Pulse: 62 70  Resp: 18 18  Temp: 97.8 F (36.6 C) 97.7 F (36.5 C)  SpO2: 98% 99%   Vitals:   06/14/17 1919 06/14/17 2100 06/15/17 0550 06/15/17 1148  BP: 129/77  128/75 (!) 153/81  Pulse: 70  62 70  Resp: 18  18 18   Temp: (!) 97.5 F (36.4 C)  97.8  F (36.6 C) 97.7 F (36.5 C)  TempSrc: Oral  Oral Oral  SpO2: 94% 96% 98% 99%  Weight:   69.7 kg (153 lb 11.2 oz)   Height:        General: Pt is alert, awake, not in acute distress Cardiovascular: RRR, S1/S2 +, no rubs, no gallops Respiratory: CTA bilaterally, no wheezing, no rhonchi Abdominal: Soft, NT, ND, bowel sounds + Extremities: no edema, no cyanosis    The results of significant diagnostics from this hospitalization (including imaging, microbiology, ancillary and laboratory) are listed below for reference.     Microbiology: No results found for this or any previous visit (from the past 240 hour(s)).   Labs: BNP (last 3 results) Recent Labs    05/04/17 0859 06/10/17 1710  BNP 509.8* 417.4*   Basic Metabolic Panel: Recent Labs  Lab 06/10/17 1710 06/11/17 0608 06/12/17 0400 06/13/17 0631 06/14/17 1419 06/15/17 0613  NA 136 136 138 138  --  138  K 3.5 3.8 4.1 3.9  --  3.3*  CL 102 105 108 105  --  106  CO2 23 20* 22 25  --  25  GLUCOSE 88 233* 156* 117*  --  91  BUN 12 12 16 15   --  16  CREATININE 1.02 1.13 1.15 0.97 1.25* 1.08  CALCIUM 9.0 8.5* 9.1 9.0   --  8.7*  MG  --   --   --   --   --  1.7   Liver Function Tests: No results for input(s): AST, ALT, ALKPHOS, BILITOT, PROT, ALBUMIN in the last 168 hours. No results for input(s): LIPASE, AMYLASE in the last 168 hours. No results for input(s): AMMONIA in the last 168 hours. CBC: Recent Labs  Lab 06/10/17 1710 06/11/17 0608 06/12/17 0400 06/14/17 1419  WBC 8.8 4.0 7.5 6.7  HGB 13.6 12.3* 12.3* 14.5  HCT 40.9 37.7* 37.4* 43.4  MCV 84.7 84.3 84.6 85.6  PLT 282 253 247 253   Cardiac Enzymes: No results for input(s): CKTOTAL, CKMB, CKMBINDEX, TROPONINI in the last 168 hours. BNP: Invalid input(s): POCBNP CBG: No results for input(s): GLUCAP in the last 168 hours. D-Dimer No results for input(s): DDIMER in the last 72 hours. Hgb A1c No results for input(s): HGBA1C in the last 72 hours. Lipid Profile No results for input(s): CHOL, HDL, LDLCALC, TRIG, CHOLHDL, LDLDIRECT in the last 72 hours. Thyroid function studies No results for input(s): TSH, T4TOTAL, T3FREE, THYROIDAB in the last 72 hours.  Invalid input(s): FREET3 Anemia work up No results for input(s): VITAMINB12, FOLATE, FERRITIN, TIBC, IRON, RETICCTPCT in the last 72 hours. Urinalysis    Component Value Date/Time   COLORURINE YELLOW 07/16/2013 1404   APPEARANCEUR CLEAR 07/16/2013 1404   LABSPEC 1.017 07/16/2013 1404   PHURINE 6.5 07/16/2013 1404   GLUCOSEU NEGATIVE 07/16/2013 1404   HGBUR NEGATIVE 07/16/2013 1404   BILIRUBINUR NEGATIVE 07/16/2013 1404   KETONESUR NEGATIVE 07/16/2013 1404   PROTEINUR NEGATIVE 07/16/2013 1404   UROBILINOGEN 1.0 07/16/2013 1404   NITRITE NEGATIVE 07/16/2013 1404   LEUKOCYTESUR NEGATIVE 07/16/2013 1404   Sepsis Labs Invalid input(s): PROCALCITONIN,  WBC,  LACTICIDVEN Microbiology No results found for this or any previous visit (from the past 240 hour(s)).   Time coordinating discharge: 40 minutes  SIGNED:  Dessa Phi, DO [facility] Pager [phone]  If  7PM-7AM, please contact night-coverage [email or web address] Password [email or web address] 06/15/2017, 12:19 PM

## 2017-06-15 NOTE — Progress Notes (Signed)
Orders received for pt discharge.  Discharge summary printed and reviewed with pt.  Explained medication regimen, and pt had no further questions at this time.  IV removed and site remains clean, dry, intact.  Telemetry removed.  Pt in stable condition and awaiting transport. 

## 2017-06-17 ENCOUNTER — Encounter (HOSPITAL_COMMUNITY): Payer: Self-pay | Admitting: Emergency Medicine

## 2017-06-17 ENCOUNTER — Inpatient Hospital Stay (HOSPITAL_COMMUNITY)
Admission: EM | Admit: 2017-06-17 | Discharge: 2017-06-19 | DRG: 291 | Disposition: A | Discharge status: Home / Self Care | Payer: Medicaid Other | Attending: Family Medicine | Admitting: Family Medicine

## 2017-06-17 ENCOUNTER — Emergency Department (HOSPITAL_COMMUNITY): Payer: Medicaid Other

## 2017-06-17 ENCOUNTER — Other Ambulatory Visit: Payer: Self-pay

## 2017-06-17 DIAGNOSIS — Z79899 Other long term (current) drug therapy: Secondary | ICD-10-CM

## 2017-06-17 DIAGNOSIS — M1990 Unspecified osteoarthritis, unspecified site: Secondary | ICD-10-CM | POA: Diagnosis present

## 2017-06-17 DIAGNOSIS — J45909 Unspecified asthma, uncomplicated: Secondary | ICD-10-CM | POA: Diagnosis present

## 2017-06-17 DIAGNOSIS — I130 Hypertensive heart and chronic kidney disease with heart failure and stage 1 through stage 4 chronic kidney disease, or unspecified chronic kidney disease: Principal | ICD-10-CM | POA: Diagnosis present

## 2017-06-17 DIAGNOSIS — Z880 Allergy status to penicillin: Secondary | ICD-10-CM

## 2017-06-17 DIAGNOSIS — N179 Acute kidney failure, unspecified: Secondary | ICD-10-CM | POA: Diagnosis not present

## 2017-06-17 DIAGNOSIS — R778 Other specified abnormalities of plasma proteins: Secondary | ICD-10-CM

## 2017-06-17 DIAGNOSIS — R1013 Epigastric pain: Secondary | ICD-10-CM

## 2017-06-17 DIAGNOSIS — R109 Unspecified abdominal pain: Secondary | ICD-10-CM

## 2017-06-17 DIAGNOSIS — R748 Abnormal levels of other serum enzymes: Secondary | ICD-10-CM | POA: Diagnosis not present

## 2017-06-17 DIAGNOSIS — I5042 Chronic combined systolic (congestive) and diastolic (congestive) heart failure: Secondary | ICD-10-CM | POA: Diagnosis present

## 2017-06-17 DIAGNOSIS — I428 Other cardiomyopathies: Secondary | ICD-10-CM

## 2017-06-17 DIAGNOSIS — R0789 Other chest pain: Secondary | ICD-10-CM | POA: Diagnosis present

## 2017-06-17 DIAGNOSIS — I10 Essential (primary) hypertension: Secondary | ICD-10-CM | POA: Diagnosis not present

## 2017-06-17 DIAGNOSIS — R7989 Other specified abnormal findings of blood chemistry: Secondary | ICD-10-CM

## 2017-06-17 DIAGNOSIS — I5043 Acute on chronic combined systolic (congestive) and diastolic (congestive) heart failure: Secondary | ICD-10-CM | POA: Diagnosis present

## 2017-06-17 DIAGNOSIS — I429 Cardiomyopathy, unspecified: Secondary | ICD-10-CM | POA: Diagnosis present

## 2017-06-17 DIAGNOSIS — N189 Chronic kidney disease, unspecified: Secondary | ICD-10-CM | POA: Diagnosis not present

## 2017-06-17 DIAGNOSIS — Z87891 Personal history of nicotine dependence: Secondary | ICD-10-CM

## 2017-06-17 DIAGNOSIS — E876 Hypokalemia: Secondary | ICD-10-CM | POA: Diagnosis present

## 2017-06-17 DIAGNOSIS — F1010 Alcohol abuse, uncomplicated: Secondary | ICD-10-CM | POA: Diagnosis present

## 2017-06-17 LAB — CBC
HCT: 40.9 % (ref 39.0–52.0)
Hemoglobin: 13.8 g/dL (ref 13.0–17.0)
MCH: 28.6 pg (ref 26.0–34.0)
MCHC: 33.7 g/dL (ref 30.0–36.0)
MCV: 84.7 fL (ref 78.0–100.0)
PLATELETS: 247 10*3/uL (ref 150–400)
RBC: 4.83 MIL/uL (ref 4.22–5.81)
RDW: 14.9 % (ref 11.5–15.5)
WBC: 11.1 10*3/uL — ABNORMAL HIGH (ref 4.0–10.5)

## 2017-06-17 LAB — COMPREHENSIVE METABOLIC PANEL
ALK PHOS: 59 U/L (ref 38–126)
ALT: 16 U/L — ABNORMAL LOW (ref 17–63)
ANION GAP: 7 (ref 5–15)
AST: 24 U/L (ref 15–41)
Albumin: 3 g/dL — ABNORMAL LOW (ref 3.5–5.0)
BILIRUBIN TOTAL: 0.9 mg/dL (ref 0.3–1.2)
BUN: 21 mg/dL — ABNORMAL HIGH (ref 6–20)
CO2: 24 mmol/L (ref 22–32)
CREATININE: 2 mg/dL — AB (ref 0.61–1.24)
Calcium: 8.3 mg/dL — ABNORMAL LOW (ref 8.9–10.3)
Chloride: 106 mmol/L (ref 101–111)
GFR calc Af Amer: 39 mL/min — ABNORMAL LOW (ref >60)
GFR, EST NON AFRICAN AMERICAN: 34 mL/min — AB (ref >60)
GLUCOSE: 109 mg/dL — AB (ref 65–99)
POTASSIUM: 3.3 mmol/L — AB (ref 3.5–5.1)
Sodium: 137 mmol/L (ref 135–145)
Total Protein: 5.5 g/dL — ABNORMAL LOW (ref 6.5–8.1)

## 2017-06-17 LAB — TROPONIN I
TROPONIN I: 0.14 ng/mL — AB (ref <0.03)
Troponin I: 0.13 ng/mL (ref <0.03)

## 2017-06-17 LAB — LACTIC ACID, PLASMA: Lactic Acid, Venous: 0.8 mmol/L (ref 0.5–1.9)

## 2017-06-17 LAB — URINALYSIS, ROUTINE W REFLEX MICROSCOPIC
Bilirubin Urine: NEGATIVE
GLUCOSE, UA: NEGATIVE mg/dL
Ketones, ur: NEGATIVE mg/dL
Leukocytes, UA: NEGATIVE
Nitrite: NEGATIVE
PH: 6 (ref 5.0–8.0)
Protein, ur: NEGATIVE mg/dL
SPECIFIC GRAVITY, URINE: 1.01 (ref 1.005–1.030)

## 2017-06-17 LAB — LIPASE, BLOOD: LIPASE: 17 U/L (ref 11–51)

## 2017-06-17 LAB — I-STAT TROPONIN, ED: Troponin i, poc: 0.09 ng/mL (ref 0.00–0.08)

## 2017-06-17 LAB — MAGNESIUM
MAGNESIUM: 1.8 mg/dL (ref 1.7–2.4)
MAGNESIUM: 1.8 mg/dL (ref 1.7–2.4)

## 2017-06-17 LAB — BRAIN NATRIURETIC PEPTIDE: B Natriuretic Peptide: 226 pg/mL — ABNORMAL HIGH (ref 0.0–100.0)

## 2017-06-17 MED ORDER — VITAMIN B-1 100 MG PO TABS
100.0000 mg | ORAL_TABLET | Freq: Every day | ORAL | Status: DC
  Administered 2017-06-17 – 2017-06-19 (×3): 100 mg via ORAL
  Filled 2017-06-17 (×3): qty 1

## 2017-06-17 MED ORDER — FOLIC ACID 1 MG PO TABS
1.0000 mg | ORAL_TABLET | Freq: Every day | ORAL | Status: DC
  Administered 2017-06-17 – 2017-06-19 (×3): 1 mg via ORAL
  Filled 2017-06-17 (×3): qty 1

## 2017-06-17 MED ORDER — ONDANSETRON HCL 4 MG/2ML IJ SOLN
4.0000 mg | Freq: Once | INTRAMUSCULAR | Status: AC
  Administered 2017-06-17: 4 mg via INTRAVENOUS
  Filled 2017-06-17: qty 2

## 2017-06-17 MED ORDER — THIAMINE HCL 100 MG/ML IJ SOLN
100.0000 mg | Freq: Every day | INTRAMUSCULAR | Status: DC
  Filled 2017-06-17: qty 2

## 2017-06-17 MED ORDER — GI COCKTAIL ~~LOC~~
30.0000 mL | Freq: Three times a day (TID) | ORAL | Status: DC | PRN
  Administered 2017-06-18: 30 mL via ORAL
  Filled 2017-06-17: qty 30

## 2017-06-17 MED ORDER — ONDANSETRON HCL 4 MG/2ML IJ SOLN: 4.0000 mg | Freq: Four times a day (QID) | INTRAMUSCULAR | Status: DC | PRN

## 2017-06-17 MED ORDER — SACUBITRIL-VALSARTAN 49-51 MG PO TABS
1.0000 | ORAL_TABLET | Freq: Two times a day (BID) | ORAL | Status: DC
  Filled 2017-06-17: qty 1

## 2017-06-17 MED ORDER — HYDROMORPHONE HCL 1 MG/ML IJ SOLN
1.0000 mg | Freq: Once | INTRAMUSCULAR | Status: AC
  Administered 2017-06-17: 1 mg via INTRAVENOUS
  Filled 2017-06-17: qty 1

## 2017-06-17 MED ORDER — ADULT MULTIVITAMIN W/MINERALS CH
1.0000 | ORAL_TABLET | Freq: Every day | ORAL | Status: DC
  Administered 2017-06-17 – 2017-06-19 (×3): 1 via ORAL
  Filled 2017-06-17 (×3): qty 1

## 2017-06-17 MED ORDER — AMIODARONE HCL 200 MG PO TABS
200.0000 mg | ORAL_TABLET | Freq: Every day | ORAL | Status: DC
  Administered 2017-06-17 – 2017-06-19 (×3): 200 mg via ORAL
  Filled 2017-06-17 (×3): qty 1

## 2017-06-17 MED ORDER — ALBUTEROL SULFATE (2.5 MG/3ML) 0.083% IN NEBU: 2.5000 mg | INHALATION_SOLUTION | Freq: Four times a day (QID) | RESPIRATORY_TRACT | Status: DC | PRN

## 2017-06-17 MED ORDER — LORAZEPAM 1 MG PO TABS: 1.0000 mg | ORAL_TABLET | Freq: Four times a day (QID) | ORAL | Status: DC | PRN

## 2017-06-17 MED ORDER — ONDANSETRON HCL 4 MG PO TABS: 4.0000 mg | ORAL_TABLET | Freq: Four times a day (QID) | ORAL | Status: DC | PRN

## 2017-06-17 MED ORDER — POTASSIUM CHLORIDE 10 MEQ/100ML IV SOLN
10.0000 meq | INTRAVENOUS | Status: AC
  Administered 2017-06-17 (×2): 10 meq via INTRAVENOUS
  Filled 2017-06-17 (×3): qty 100

## 2017-06-17 MED ORDER — CARVEDILOL 3.125 MG PO TABS
3.1250 mg | ORAL_TABLET | Freq: Two times a day (BID) | ORAL | Status: DC
  Administered 2017-06-18 – 2017-06-19 (×3): 3.125 mg via ORAL
  Filled 2017-06-17 (×3): qty 1

## 2017-06-17 MED ORDER — HEPARIN SODIUM (PORCINE) 5000 UNIT/ML IJ SOLN
5000.0000 [IU] | Freq: Three times a day (TID) | INTRAMUSCULAR | Status: DC
  Administered 2017-06-17 – 2017-06-19 (×5): 5000 [IU] via SUBCUTANEOUS
  Filled 2017-06-17 (×5): qty 1

## 2017-06-17 MED ORDER — SODIUM CHLORIDE 0.9 % IV SOLN
INTRAVENOUS | Status: DC
  Administered 2017-06-18: 06:00:00 via INTRAVENOUS

## 2017-06-17 MED ORDER — SODIUM CHLORIDE 0.9 % IV SOLN
Freq: Once | INTRAVENOUS | Status: AC
  Administered 2017-06-17: 14:00:00 via INTRAVENOUS

## 2017-06-17 MED ORDER — GI COCKTAIL ~~LOC~~
30.0000 mL | Freq: Once | ORAL | Status: AC
  Administered 2017-06-17: 30 mL via ORAL
  Filled 2017-06-17: qty 30

## 2017-06-17 MED ORDER — PANTOPRAZOLE SODIUM 40 MG IV SOLR
40.0000 mg | Freq: Once | INTRAVENOUS | Status: AC
  Administered 2017-06-17: 40 mg via INTRAVENOUS
  Filled 2017-06-17: qty 40

## 2017-06-17 MED ORDER — ACETAMINOPHEN 325 MG PO TABS: 650.0000 mg | ORAL_TABLET | Freq: Four times a day (QID) | ORAL | Status: DC | PRN

## 2017-06-17 MED ORDER — GI COCKTAIL ~~LOC~~: 30.0000 mL | Freq: Three times a day (TID) | ORAL | Status: DC | PRN

## 2017-06-17 MED ORDER — SENNOSIDES-DOCUSATE SODIUM 8.6-50 MG PO TABS: 1.0000 | ORAL_TABLET | Freq: Every evening | ORAL | Status: DC | PRN

## 2017-06-17 MED ORDER — PANTOPRAZOLE SODIUM 40 MG IV SOLR
40.0000 mg | INTRAVENOUS | Status: DC
  Administered 2017-06-18 – 2017-06-19 (×2): 40 mg via INTRAVENOUS
  Filled 2017-06-17 (×2): qty 40

## 2017-06-17 MED ORDER — HYDROMORPHONE HCL 1 MG/ML IJ SOLN
1.0000 mg | INTRAMUSCULAR | Status: DC | PRN
  Administered 2017-06-17 – 2017-06-18 (×3): 1 mg via INTRAVENOUS
  Filled 2017-06-17 (×3): qty 1

## 2017-06-17 MED ORDER — LORAZEPAM 2 MG/ML IJ SOLN: 1.0000 mg | Freq: Four times a day (QID) | INTRAMUSCULAR | Status: DC | PRN

## 2017-06-17 MED ORDER — BISACODYL 10 MG RE SUPP: 10.0000 mg | Freq: Every day | RECTAL | Status: DC | PRN

## 2017-06-17 MED ORDER — ALBUTEROL SULFATE HFA 108 (90 BASE) MCG/ACT IN AERS: 1.0000 | INHALATION_SPRAY | Freq: Four times a day (QID) | RESPIRATORY_TRACT | Status: DC | PRN

## 2017-06-17 MED ORDER — HYDROCODONE-ACETAMINOPHEN 5-325 MG PO TABS
1.0000 | ORAL_TABLET | ORAL | Status: DC | PRN
  Administered 2017-06-17 – 2017-06-19 (×4): 2 via ORAL
  Filled 2017-06-17 (×4): qty 2

## 2017-06-17 MED ORDER — HYDRALAZINE HCL 20 MG/ML IJ SOLN: 10.0000 mg | INTRAMUSCULAR | Status: DC | PRN

## 2017-06-17 MED ORDER — ACETAMINOPHEN 650 MG RE SUPP: 650.0000 mg | Freq: Four times a day (QID) | RECTAL | Status: DC | PRN

## 2017-06-17 MED ORDER — TRAZODONE HCL 50 MG PO TABS: 25.0000 mg | ORAL_TABLET | Freq: Every evening | ORAL | Status: DC | PRN

## 2017-06-17 NOTE — ED Notes (Signed)
Unsuccessful attempt at giving report to [facility].  This nurse stayed on hold for 5 minutes. Nurse receiving patient wanted to call back so I told secretary that if I didn't give report and leave floor by 1840 then I could not bring patient up until 1930.  No one has called back as of yet.

## 2017-06-17 NOTE — Plan of Care (Signed)
Pt. States he's feeling better

## 2017-06-17 NOTE — ED Notes (Signed)
Patient in lot of pain. EDP at bedside.

## 2017-06-17 NOTE — H&P (Signed)
History and Physical    KYI ROMANELLO XLK:[id number] DOB: [birth year] DOA: 06/17/2017   PCP: [facility], [facility]   Patient coming from:  Home    Chief Complaint:  Epigastric Pain   HPI: Joe Garcia is a 66 y.o. 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  He also reports left-sided chest pain, which is reproducible, worse with any kind of movement.  He has some nausea, but denies any vomiting.  He never had similar symptoms.  He denies any worsening shortness of breath, cough, or sick contacts.  He denies any fever or chills.  The patient reports being compliant with his meds, including not taking lisinopril as directed by his cardiologist.  He denies any tobacco, ETOH or recreational drug use. He reports poor oral intake due to the symptoms. He had decreased urine output as well. He denies any hematochezia or hematuria. He denies  any leg swelling.  No confusion is reported.  ED Course:  BP (!) 159/82   Pulse (!) 32   Resp (!) 28   Ht 5\' 11"  (1.803 m)   Wt 69.4 kg (153 lb)   SpO2 97%   BMI 21.34 kg/m   Lipase normal at 17 Potassium 3.3 BUN 21, creatinine 2.0, was 0.97 on 06/13/2017, discharge on 06/15/2017 with a creatinine of 1.08 GFR 39, was greater than 60 on 06/15/2017 Alkaline phosphatase is 59 Albumin is 3.0, the patient has not had significant amount of oral intake over the last 2 days Total bilirubin 0.9 B in P2 126 Troponin 0 0.09 WBC 11.1, was 6.7 on 06/14/2017 Hemoglobin 13.8 Platelets 247 Urine pending Chest x-ray  NAD Echo 04/2017 EF 40-34%, grade 1 diastolic, diffuse hypokinesis EKG Sinus rhythm Ventricular bigeminy, Abnormal T, consider ischemia, inferior leads, Prolonged QT interval, new TWI in the inferior leads, which will need to be monitored.   Review of Systems:  As per HPI otherwise all other systems reviewed and are negative  Past Medical History:  Diagnosis Date  . Alcoholism ([place])   . Arthritis    hips, L shoulder  . Asthma   . CHF (congestive heart failure) ([place])   . Depression   . Family history of anesthesia complication   . Hyperlipidemia   . Hypertension     Past Surgical History:  Procedure Laterality Date  . RIGHT/LEFT HEART CATH AND CORONARY ANGIOGRAPHY N/A 06/12/2017   Procedure: RIGHT/LEFT HEART CATH AND CORONARY ANGIOGRAPHY;  Surgeon: Jolaine Artist, MD;  Location: [facility];  Service: Cardiovascular;  Laterality: N/A;    Social History Social History   Socioeconomic History  . Marital status: Single    Spouse name: Not on file  . Number of children: Not on file  . Years of education: Not on file  . Highest education level: Not on file  Social Needs  . Financial resource strain: Not on file  . Food insecurity - worry: Not on file  . Food insecurity - inability: Not on file  . Transportation needs - medical: Not on file  . Transportation needs - non-medical: Not on file  Occupational History  . Occupation: "it  don't work for me", on disability  Tobacco Use  . Smoking status: Former Smoker    Packs/day: 0.10    Years: 45.00    Pack years: 4.50    Last attempt to quit: 2012    Years since quitting: 6.8  . Smokeless tobacco: Never Used  Substance and Sexual Activity  . Alcohol use: No    Frequency: Never    Comment: Pt states no etoh since April 2014  . Drug use: No    Comment: Prior use of crack cocaine, quit 2012  . Sexual activity: No  Other Topics Concern  . Not on file  Social History Narrative  . Not on file     Allergies   Allergen Reactions  . Penicillins     REACTION: "stomach pains"    Family History  Problem Relation Age of Onset  . Heart disease Father   . Heart disease Daughter        "fluid around heart"       Prior to Admission medications   Medication Sig Start Date End Date Taking? Authorizing Provider  albuterol (PROVENTIL HFA;VENTOLIN HFA) 108 (90 BASE) MCG/ACT inhaler Inhale 1-2 puffs into the lungs every 6 (six) hours as needed for wheezing or shortness of breath. 07/12/15  Yes Barrett, Lahoma Crocker, PA-C  albuterol (PROVENTIL) (2.5 MG/3ML) 0.083% nebulizer solution Take 3 mLs (2.5 mg total) by nebulization every 6 (six) hours as needed for wheezing or shortness of breath. 10/16/15  Yes Kirichenko, Tatyana, PA-C  amiodarone (PACERONE) 200 MG tablet Take 2 tablets (400 mg total) 2 (two) times daily for 7 days by mouth, THEN 1 tablet (200 mg total) 2 (two) times daily for 7 days. 06/15/17 06/29/17 Yes Dessa Phi, DO  carvedilol (COREG) 3.125 MG tablet Take 1 tablet (3.125 mg total) 2 (two) times daily with a meal by mouth. 06/15/17  Yes Dessa Phi, DO  ibuprofen (ADVIL,MOTRIN) 200 MG tablet Take 400 mg by mouth every 6 (six) hours as needed for moderate pain.   Yes [provider]  sacubitril-valsartan (ENTRESTO) 49-51 MG Take 1 tablet 2 (two) times daily by mouth. 06/15/17   Dessa Phi, DO    Physical Exam:  Vitals:   06/17/17 1300 06/17/17 1315 06/17/17 1400 06/17/17 1500  BP: (!) 170/89 (!) 171/75 (!) 179/58 (!) 159/82  Pulse: (!) 53 66 (!) 32   Resp: 14 (!) 23 (!) 28   SpO2: 100% 100% 98% 97%  Weight:      Height:       Constitutional: NAD, very uncomfortable due to epigastric pain. DIscheveled appearance  Eyes: PERRL, lids and conjunctivae normal ENMT: Mucous membranes are moist, without exudate or lesions  Neck: normal, supple, no masses, no thyromegaly Respiratory: clear to auscultation bilaterally, no wheezing, no crackles. Normal respiratory effort   Cardiovascular: Regular rate and rhythm, very soft 1 out of 6 murmur, rubs or gallops. No extremity edema. 2+ pedal pulses. No carotid bruits.  Abdomen: Soft, tender to palpation in the epigastric area, extending to the left, No hepatosplenomegaly. Bowel sounds positive.  No CVAT Musculoskeletal: no clubbing / cyanosis. Moves all extremities.  Reproducible chest pain on the left ribs Skin: no jaundice, No lesions.  Neurologic: Sensation intact  Strength equal in all extremities Psychiatric:   Alert and oriented x 3.  Very anxious.     Labs on Admission: I have personally reviewed following labs and imaging studies  CBC: Recent Labs  Lab 06/10/17 1710 06/11/17 0608 06/12/17 0400 06/14/17 1419 06/17/17  1239  WBC 8.8 4.0 7.5 6.7 11.1*  HGB 13.6 12.3* 12.3* 14.5 13.8  HCT 40.9 37.7* 37.4* 43.4 40.9  MCV 84.7 84.3 84.6 85.6 84.7  PLT 282 253 247 253 774    Basic Metabolic Panel: Recent Labs  Lab 06/11/17 0608 06/12/17 0400 06/13/17 0631 06/14/17 1419 06/15/17 0613 06/17/17 1239  NA 136 138 138  --  138 137  K 3.8 4.1 3.9  --  3.3* 3.3*  CL 105 108 105  --  106 106  CO2 20* 22 25  --  25 24  GLUCOSE 233* 156* 117*  --  91 109*  BUN 12 16 15   --  16 21*  CREATININE 1.13 1.15 0.97 1.25* 1.08 2.00*  CALCIUM 8.5* 9.1 9.0  --  8.7* 8.3*  MG  --   --   --   --  1.7 1.8    GFR: Estimated Creatinine Clearance: 36.6 mL/min (A) (by C-G formula based on SCr of 2 mg/dL (H)).  Liver Function Tests: Recent Labs  Lab 06/17/17 1239  AST 24  ALT 16*  ALKPHOS 59  BILITOT 0.9  PROT 5.5*  ALBUMIN 3.0*   Recent Labs  Lab 06/17/17 1239  LIPASE 17   No results for input(s): AMMONIA in the last 168 hours.  Coagulation Profile: Recent Labs  Lab 06/12/17 0400  INR 1.00    Cardiac Enzymes: No results for input(s): CKTOTAL, CKMB, CKMBINDEX, TROPONINI in the last 168 hours.  BNP (last 3 results) No results for input(s): PROBNP in the last 8760 hours.  HbA1C: No results  for input(s): HGBA1C in the last 72 hours.  CBG: No results for input(s): GLUCAP in the last 168 hours.  Lipid Profile: No results for input(s): CHOL, HDL, LDLCALC, TRIG, CHOLHDL, LDLDIRECT in the last 72 hours.  Thyroid Function Tests: No results for input(s): TSH, T4TOTAL, FREET4, T3FREE, THYROIDAB in the last 72 hours.  Anemia Panel: No results for input(s): VITAMINB12, FOLATE, FERRITIN, TIBC, IRON, RETICCTPCT in the last 72 hours.  Urine analysis:    Component Value Date/Time   COLORURINE YELLOW 07/16/2013 1404   APPEARANCEUR CLEAR 07/16/2013 1404   LABSPEC 1.017 07/16/2013 1404   PHURINE 6.5 07/16/2013 1404   GLUCOSEU NEGATIVE 07/16/2013 1404   HGBUR NEGATIVE 07/16/2013 1404   BILIRUBINUR NEGATIVE 07/16/2013 1404   KETONESUR NEGATIVE 07/16/2013 1404   PROTEINUR NEGATIVE 07/16/2013 1404   UROBILINOGEN 1.0 07/16/2013 1404   NITRITE NEGATIVE 07/16/2013 1404   LEUKOCYTESUR NEGATIVE 07/16/2013 1404    Sepsis Labs: @LABRCNTIP (procalcitonin:4,lacticidven:4) )No results found for this or any previous visit (from the past 240 hour(s)).   Radiological Exams on Admission: Ct Abdomen Pelvis Wo Contrast  Result Date: 06/17/2017 CLINICAL DATA:  Left-sided abdominal pain with nausea and vomiting EXAM: CT ABDOMEN AND PELVIS WITHOUT CONTRAST TECHNIQUE: Multidetector CT imaging of the abdomen and pelvis was performed following the standard protocol without IV contrast. COMPARISON:  09/21/2012 FINDINGS: Lower chest: Minimal scarring is noted in the right lower lobe. Hepatobiliary: No focal liver abnormality is seen. No gallstones, gallbladder wall thickening, or biliary dilatation. Pancreas: Unremarkable. No pancreatic ductal dilatation or surrounding inflammatory changes. Spleen: Normal in size without focal abnormality. Adrenals/Urinary Tract: Left adrenal gland is within normal limits. The right adrenal gland demonstrates some adjacent calcifications similar to that seen on prior plain  film. These have a chronic appearance. The left kidney is mildly hypertrophic. No renal calculi or obstructive changes are noted. A right kidney is not visualized. It is uncertain whether  this represents a congenital or postoperative change. The bladder is well distended. Stomach/Bowel: No obstructive changes are seen. The appendix is within normal limits. No inflammatory changes are noted. Vascular/Lymphatic: Aortic atherosclerosis. No enlarged abdominal or pelvic lymph nodes. Reproductive: Prostate is unremarkable. Other: No abdominal wall hernia or abnormality. No abdominopelvic ascites. Musculoskeletal: Degenerative changes of the lumbar spine are noted. No acute bony abnormality is seen. 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 IMPRESSION: Right kidney is not visualized. It is uncertain if this represents a congenital anomaly or related to postsurgical change. Clinical correlation is recommended Calcifications adjacent to the right adrenal gland which are chronic over multiple previous exams dating back to 2010 No acute abnormality noted. Electronically Signed   By: Inez Catalina M.D.   On: 06/17/2017 14:33   Dg Chest 2 View  Result Date: 06/17/2017 CLINICAL DATA:  Chest pain. EXAM: CHEST  2 VIEW COMPARISON:  Radiographs of June 10, 2017. FINDINGS: The heart size and mediastinal contours are within normal limits. Both lungs are clear. No pneumothorax or pleural effusion is noted. The visualized skeletal structures are unremarkable. IMPRESSION: No active cardiopulmonary disease. Electronically Signed   By: Marijo Conception, M.D.   On: 06/17/2017 13:37    EKG: Independently reviewed.  Assessment/Plan Active Problems:   AKI (acute kidney injury) ([place])   Essential hypertension   Alcohol abuse   Chronic combined systolic (congestive) and diastolic (congestive) heart failure ([facility])   Nonischemic cardiomyopathy ([facility])   Epigastric pain   Elevated troponin    Epigastric pain, unclear  etiology, rule out gastritis, versus other etiology, in the setting of acute kidney injury.  Patient presented with significant reproducible epigastric pain, radiating to the left, lipase and alkaline phosphatase were normal.  CT abdomen and pelvis is pending White count 11, he was noted to have elevated creatinine compared to prior labs(see below) Admit to telemetry obs IV fluids N.p.o. until the results of the CT of the abdomen and pelvis are available CBC in a.m. Blood culture  Check results of UA  Continue GI cocktail, Protonix, antiemetics   Pain control   Acute Kidney Injury likely due to dehydration versus med induced (recent change in medication dosing prior admission BUN 21, creatinine 2.0, was 0.97 on 06/13/2017, discharge on 06/15/2017 with a creatinine of 1.08 GFR 39, was greater than 60 on 06/15/2017. No prior history of kidney disease  Lab Results  Component Value Date   CREATININE 2.00 (H) 06/17/2017   CREATININE 1.08 06/15/2017   CREATININE 1.25 (H) 06/14/2017  IVF CMET in am  Hold starting  Entresto today, may resume tomorrow if the renal functions improve, as recommended by cardiology.  Hold NSAIDs   Elevated Troponin and abnormal EKG/ NICM and history of combined systolic and diastolic CHF, NOCAD s/p Card cath 06/12/2017 . EKG Sinus rhythm Ventricular bigeminy, Abnormal T, consider ischemia, inferior leads, Prolonged QT interval, new TWI in the inferior leads, which will need to be monitored, in view of new findings, and recent catheterization, and hospitalization for CHF exacerbation.Echo 04/2017 EF 09-62%, grade 1 diastolic, diffuse hypokinesis.  BNP 226 Weight 153 pounds Spoke with Dr. Stanford Breed, Cards, who recommends to follow up on serial troponins, EKG, and if these values continue to be abnormal, or worsening, to contact cardiology for formal consultation. Hold starting Entresto today, may resume in am if Renal fuctions ok based on new BMET in am  . Appreciate their  involvement    Hypertension BP 156/78   Continue home anti-hypertensive  medications    Hypokalemia, may be due to diuretics and poor oral intake  Current K 3.3  Oral replenishment 20 K  Check Mg Repeat CMET in am   History of alcohol abuse Patient denies taking any recent alcohol intake  Check alcohol level CIWA protocol   DVT prophylaxis: Heparin Code Status:    Full Family Communication:  Discussed with patient Disposition Plan: Expect patient to be discharged to home after condition improves Consults called:    None.  Discussed the case with Dr. Stanford Breed, cardiology regarding abnormalities in the EKG, and troponins Admission status: Telemetry observation   Sharene Butters, PA-C [facility]   06/17/2017, 3:24 PM

## 2017-06-17 NOTE — ED Notes (Signed)
On way to CT 

## 2017-06-17 NOTE — ED Provider Notes (Signed)
[facility] EMERGENCY DEPARTMENT Provider Note   CSN: [id number] Arrival date & time: 06/17/17  1228     History   Chief Complaint Chief Complaint  Patient presents with  . Chest Pain    HPI Joe Garcia is a 66 y.o. male.  HPI  66 year old male with past medical history of asthma, diastolic and systolic heart failure, recent admission for same, who presents with chest pain and nausea.  The patient reports that starting last night, he developed gradual onset of progressive worsening aching, throbbing epigastric and left-sided chest pain.  He had associated nausea but no vomiting.  The pain is worse with any kind of movement or palpation.  He has never had symptoms similar to this.  He has had associated mild shortness of breath, though he believes this is due to his pain.  Denies any increase in his lower extremity swelling.  Denies any fevers or chills.  He denies any alcohol use since he has been home.  Denies any other over-the-counter medication use.  No blood in his stool or emesis.  No other medical complaints.   Past Medical History:  Diagnosis Date  . Alcoholism ([place])   . Arthritis    hips, L shoulder  . Asthma   . CHF (congestive heart failure) ([place])   . Depression   . Family history of anesthesia complication   . Hyperlipidemia   . Hypertension     Patient Active Problem List   Diagnosis Date Noted  . AKI (acute kidney injury) ([place]) 06/17/2017  . Epigastric pain 06/17/2017  . Elevated troponin 06/17/2017  . Acute on chronic combined systolic and diastolic CHF (congestive heart failure) ([place])   . Nonischemic cardiomyopathy ([place])   . Multifocal PVCs   . Acute on chronic respiratory failure with hypoxia ([place]) 06/11/2017  . Chronic combined systolic (congestive) and diastolic (congestive) heart failure ([place]) 06/11/2017  . Asthma exacerbation 06/10/2017  . Influenza A 07/17/2013  . Influenza 07/16/2013  . Asthma exacerbation in COPD ([place])  07/16/2013  . Fever 07/16/2013  . Tachycardia 07/16/2013  . Alcohol abuse 09/25/2012  . Alcohol dependence ([place]) 09/22/2012  . Essential hypertension 03/22/2007  . DEGENERATIVE DISC DISEASE 03/22/2007  . 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 RIGHT/LEFT HEART CATH AND CORONARY ANGIOGRAPHY N/A 06/12/2017   Procedure: RIGHT/LEFT HEART CATH AND CORONARY ANGIOGRAPHY;  Surgeon: Jolaine Artist, MD;  Location: [facility];  Service: Cardiovascular;  Laterality: N/A;       Home Medications    Prior to Admission medications   Medication Sig Start Date End Date Taking? Authorizing Provider  albuterol (PROVENTIL HFA;VENTOLIN HFA) 108 (90 BASE) MCG/ACT inhaler Inhale 1-2 puffs into the lungs every 6 (six) hours as needed for wheezing or shortness of breath. 07/12/15  Yes Barrett, Lahoma Crocker, PA-C  albuterol (PROVENTIL) (2.5 MG/3ML) 0.083% nebulizer solution Take 3 mLs (2.5 mg total) by nebulization every 6 (six) hours as needed for wheezing or shortness of breath. 10/16/15  Yes Kirichenko, Tatyana, PA-C  amiodarone (PACERONE) 200 MG tablet Take 2 tablets (400 mg total) 2 (two) times daily for 7 days by mouth, THEN 1 tablet (200 mg total) 2 (two) times daily for 7 days. 06/15/17 06/29/17 Yes Dessa Phi, DO  carvedilol (COREG) 3.125 MG tablet Take 1 tablet (3.125 mg total) 2 (two) times daily with a meal by mouth. 06/15/17  Yes Dessa Phi, DO  ibuprofen (ADVIL,MOTRIN) 200 MG tablet Take 400  mg by mouth every 6 (six) hours as needed for moderate pain.   Yes [provider]  sacubitril-valsartan (ENTRESTO) 49-51 MG Take 1 tablet 2 (two) times daily by mouth. 06/15/17   Dessa Phi, DO    Family History Family History  Problem Relation Age of Onset  . Heart disease Father   . Heart disease Daughter        "fluid around heart"     Social History Social History   Tobacco Use  . Smoking status: Former Smoker    Packs/day:  0.10    Years: 45.00    Pack years: 4.50    Last attempt to quit: 2012    Years since quitting: 6.8  . Smokeless tobacco: Never Used  Substance Use Topics  . Alcohol use: No    Frequency: Never    Comment: Pt states no etoh since April 2014  . Drug use: No    Comment: Prior use of crack cocaine, quit 2012     Allergies   Penicillins   Review of Systems Review of Systems  Constitutional: Positive for fatigue.  Respiratory: Positive for chest tightness and shortness of breath.   Cardiovascular: Positive for chest pain.  Gastrointestinal: Positive for abdominal pain and nausea.  Neurological: Positive for weakness.  All other systems reviewed and are negative.    Physical Exam Updated Vital Signs BP (!) 156/78   Pulse (!) 30   Resp (!) 28   Ht 5\' 11"  (1.803 m)   Wt 69.4 kg (153 lb)   SpO2 97%   BMI 21.34 kg/m   Physical Exam  Constitutional: He is oriented to person, place, and time. He appears well-developed and well-nourished. He appears distressed.  HENT:  Head: Normocephalic and atraumatic.  Eyes: Conjunctivae are normal.  Neck: Neck supple.  Cardiovascular: Normal rate, regular rhythm and normal heart sounds. Exam reveals no friction rub.  No murmur heard. Pulmonary/Chest: Effort normal and breath sounds normal. No respiratory distress. He has no wheezes. He has no rales.  Abdominal: Soft. He exhibits no distension. There is tenderness (epigastric). There is no rebound.  Musculoskeletal: He exhibits no edema.  Neurological: He is alert and oriented to person, place, and time. He exhibits normal muscle tone.  Skin: Skin is warm. Capillary refill takes less than 2 seconds.  Psychiatric: He has a normal mood and affect.  Nursing note and vitals reviewed.    ED Treatments / Results  Labs (all labs ordered are listed, but only abnormal results are displayed) Labs Reviewed  CBC - Abnormal; Notable for the following components:      Result Value   WBC 11.1  (*)    All other components within normal limits  COMPREHENSIVE METABOLIC PANEL - Abnormal; Notable for the following components:   Potassium 3.3 (*)    Glucose, Bld 109 (*)    BUN 21 (*)    Creatinine, Ser 2.00 (*)    Calcium 8.3 (*)    Total Protein 5.5 (*)    Albumin 3.0 (*)    ALT 16 (*)    GFR calc non Af Amer 34 (*)    GFR calc Af Amer 39 (*)    All other components within normal limits  BRAIN NATRIURETIC PEPTIDE - Abnormal; Notable for the following components:   B Natriuretic Peptide 226.0 (*)    All other components within normal limits  I-STAT TROPONIN, ED - Abnormal; Notable for the following components:   Troponin i, poc 0.09 (*)  All other components within normal limits  MAGNESIUM  LIPASE, BLOOD  LACTIC ACID, PLASMA  LACTIC ACID, PLASMA    EKG  EKG Interpretation  Date/Time:  Wednesday June 17 2017 12:36:59 EST Ventricular Rate:  68 PR Interval:    QRS Duration: 104 QT Interval:  485 QTC Calculation: 516 R Axis:   33 Text Interpretation:  Sinus rhythm Ventricular bigeminy Abnormal T, consider ischemia, inferior leads Prolonged QT interval Since last EKG, ventricular bigeminy is new and there are new TWI in inferior leads Confirmed by Duffy Bruce [phone]) on 06/17/2017 1:21:19 PM       Radiology Ct Abdomen Pelvis Wo Contrast  Result Date: 06/17/2017 CLINICAL DATA:  Left-sided abdominal pain with nausea and vomiting EXAM: CT ABDOMEN AND PELVIS WITHOUT CONTRAST TECHNIQUE: Multidetector CT imaging of the abdomen and pelvis was performed following the standard protocol without IV contrast. COMPARISON:  09/21/2012 FINDINGS: Lower chest: Minimal scarring is noted in the right lower lobe. Hepatobiliary: No focal liver abnormality is seen. No gallstones, gallbladder wall thickening, or biliary dilatation. Pancreas: Unremarkable. No pancreatic ductal dilatation or surrounding inflammatory changes. Spleen: Normal in size without focal abnormality.  Adrenals/Urinary Tract: Left adrenal gland is within normal limits. The right adrenal gland demonstrates some adjacent calcifications similar to that seen on prior plain film. These have a chronic appearance. The left kidney is mildly hypertrophic. No renal calculi or obstructive changes are noted. A right kidney is not visualized. It is uncertain whether this represents a congenital or postoperative change. The bladder is well distended. Stomach/Bowel: No obstructive changes are seen. The appendix is within normal limits. No inflammatory changes are noted. Vascular/Lymphatic: Aortic atherosclerosis. No enlarged abdominal or pelvic lymph nodes. Reproductive: Prostate is unremarkable. Other: No abdominal wall hernia or abnormality. No abdominopelvic ascites. Musculoskeletal: Degenerative changes of the lumbar spine are noted. No acute bony abnormality is seen. 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 IMPRESSION: Right kidney is not visualized. It is uncertain if this represents a congenital anomaly or related to postsurgical change. Clinical correlation is recommended Calcifications adjacent to the right adrenal gland which are chronic over multiple previous exams dating back to 2010 No acute abnormality noted. Electronically Signed   By: Inez Catalina M.D.   On: 06/17/2017 14:33   Dg Chest 2 View  Result Date: 06/17/2017 CLINICAL DATA:  Chest pain. EXAM: CHEST  2 VIEW COMPARISON:  Radiographs of June 10, 2017. FINDINGS: The heart size and mediastinal contours are within normal limits. Both lungs are clear. No pneumothorax or pleural effusion is noted. The visualized skeletal structures are unremarkable. IMPRESSION: No active cardiopulmonary disease. Electronically Signed   By: Marijo Conception, M.D.   On: 06/17/2017 13:37    Procedures Procedures (including critical care time)  Medications Ordered in ED Medications  gi cocktail (Maalox,Lidocaine,Donnatal) (30 mLs Oral Given 06/17/17 1311)    HYDROmorphone (DILAUDID) injection 1 mg (1 mg Intravenous Given 06/17/17 1311)  ondansetron (ZOFRAN) injection 4 mg (4 mg Intravenous Given 06/17/17 1311)  0.9 %  sodium chloride infusion ( Intravenous New Bag/Given 06/17/17 1429)  pantoprazole (PROTONIX) injection 40 mg (40 mg Intravenous Given 06/17/17 1510)     Initial Impression / Assessment and Plan / ED Course  I have reviewed the triage vital signs and the nursing notes.  Pertinent labs & imaging results that were available during my care of the patient were reviewed by me and considered in my medical decision making (see chart for details).     66 year old male with  past medical history as above.  With severe epigastric pain and nausea.  Lab work shows acute on chronic kidney injury.  He has a mild leukocytosis as well.  Troponin elevated and BNP elevated, but he is not hypervolemic on exam, and he just had normal coronary arteries on catheterization.  Discussed with cardiology on-call, he does not feel ACS workup is indicated.  They recommend treating his AK I, possible gastritis, and will admit to hospitalist service.  This may be secondary to his recent prednisone use, so will give PPI, gentle fluids, and monitor in hospital.  Final Clinical Impressions(s) / ED Diagnoses   Final diagnoses:  Atypical chest pain  Acute renal failure superimposed on chronic kidney disease, unspecified CKD stage, unspecified acute renal failure type [facility])    ED Discharge Orders    None       Duffy Bruce, MD 06/17/17 1623

## 2017-06-17 NOTE — ED Notes (Signed)
Hospitalist Wertman made aware of patient's troponin of 0.13 and patient's pain not being controlled.  She advised to go ahead and give patient dilaudid IV.

## 2017-06-17 NOTE — ED Triage Notes (Signed)
Per EMS, patient coming from PCP with complaint of SOB and chest pain.  Recently discharged from hospital with CHF complications. 1 nitro given at PCP with no relief.  Another nitro given en route to hospital a long with 324 aspirin and 4 zofran.  Complaining of left sided chest pain that radiates to upper left quadrant of abdomen.  States it is shooting pain.  "Feels like indigestion". Patient vomited twice this morning and has been having pain since 7am.  180/84, HR 57 with frequent PVCs, RR 20, 100% on 4L.

## 2017-06-18 ENCOUNTER — Other Ambulatory Visit: Payer: Self-pay

## 2017-06-18 ENCOUNTER — Observation Stay (HOSPITAL_COMMUNITY): Payer: Medicaid Other

## 2017-06-18 DIAGNOSIS — I5043 Acute on chronic combined systolic (congestive) and diastolic (congestive) heart failure: Secondary | ICD-10-CM | POA: Diagnosis present

## 2017-06-18 DIAGNOSIS — Z880 Allergy status to penicillin: Secondary | ICD-10-CM | POA: Diagnosis not present

## 2017-06-18 DIAGNOSIS — I5042 Chronic combined systolic (congestive) and diastolic (congestive) heart failure: Secondary | ICD-10-CM | POA: Diagnosis not present

## 2017-06-18 DIAGNOSIS — Z79899 Other long term (current) drug therapy: Secondary | ICD-10-CM | POA: Diagnosis not present

## 2017-06-18 DIAGNOSIS — Z87891 Personal history of nicotine dependence: Secondary | ICD-10-CM | POA: Diagnosis not present

## 2017-06-18 DIAGNOSIS — N179 Acute kidney failure, unspecified: Secondary | ICD-10-CM

## 2017-06-18 DIAGNOSIS — I428 Other cardiomyopathies: Secondary | ICD-10-CM

## 2017-06-18 DIAGNOSIS — R748 Abnormal levels of other serum enzymes: Secondary | ICD-10-CM | POA: Diagnosis not present

## 2017-06-18 DIAGNOSIS — F1010 Alcohol abuse, uncomplicated: Secondary | ICD-10-CM | POA: Diagnosis present

## 2017-06-18 DIAGNOSIS — R1032 Left lower quadrant pain: Secondary | ICD-10-CM

## 2017-06-18 DIAGNOSIS — I10 Essential (primary) hypertension: Secondary | ICD-10-CM

## 2017-06-18 DIAGNOSIS — N189 Chronic kidney disease, unspecified: Secondary | ICD-10-CM | POA: Diagnosis present

## 2017-06-18 DIAGNOSIS — E876 Hypokalemia: Secondary | ICD-10-CM | POA: Diagnosis present

## 2017-06-18 DIAGNOSIS — I130 Hypertensive heart and chronic kidney disease with heart failure and stage 1 through stage 4 chronic kidney disease, or unspecified chronic kidney disease: Secondary | ICD-10-CM | POA: Diagnosis present

## 2017-06-18 DIAGNOSIS — R0789 Other chest pain: Secondary | ICD-10-CM | POA: Diagnosis present

## 2017-06-18 DIAGNOSIS — I429 Cardiomyopathy, unspecified: Secondary | ICD-10-CM | POA: Diagnosis present

## 2017-06-18 DIAGNOSIS — K529 Noninfective gastroenteritis and colitis, unspecified: Secondary | ICD-10-CM | POA: Diagnosis not present

## 2017-06-18 DIAGNOSIS — M1990 Unspecified osteoarthritis, unspecified site: Secondary | ICD-10-CM | POA: Diagnosis present

## 2017-06-18 DIAGNOSIS — J45909 Unspecified asthma, uncomplicated: Secondary | ICD-10-CM | POA: Diagnosis present

## 2017-06-18 LAB — COMPREHENSIVE METABOLIC PANEL
ALK PHOS: 61 U/L (ref 38–126)
ALT: 30 U/L (ref 17–63)
ANION GAP: 8 (ref 5–15)
AST: 47 U/L — ABNORMAL HIGH (ref 15–41)
Albumin: 2.9 g/dL — ABNORMAL LOW (ref 3.5–5.0)
BILIRUBIN TOTAL: 1 mg/dL (ref 0.3–1.2)
BUN: 17 mg/dL (ref 6–20)
CALCIUM: 8.1 mg/dL — AB (ref 8.9–10.3)
CO2: 22 mmol/L (ref 22–32)
Chloride: 106 mmol/L (ref 101–111)
Creatinine, Ser: 1.65 mg/dL — ABNORMAL HIGH (ref 0.61–1.24)
GFR, EST AFRICAN AMERICAN: 49 mL/min — AB (ref >60)
GFR, EST NON AFRICAN AMERICAN: 42 mL/min — AB (ref >60)
Glucose, Bld: 104 mg/dL — ABNORMAL HIGH (ref 65–99)
Potassium: 3.5 mmol/L (ref 3.5–5.1)
Sodium: 136 mmol/L (ref 135–145)
TOTAL PROTEIN: 5.6 g/dL — AB (ref 6.5–8.1)

## 2017-06-18 LAB — CBC
HCT: 42.6 % (ref 39.0–52.0)
HEMOGLOBIN: 14.1 g/dL (ref 13.0–17.0)
MCH: 28.1 pg (ref 26.0–34.0)
MCHC: 33.1 g/dL (ref 30.0–36.0)
MCV: 85 fL (ref 78.0–100.0)
PLATELETS: 205 10*3/uL (ref 150–400)
RBC: 5.01 MIL/uL (ref 4.22–5.81)
RDW: 15 % (ref 11.5–15.5)
WBC: 9.1 10*3/uL (ref 4.0–10.5)

## 2017-06-18 LAB — URINE CULTURE

## 2017-06-18 LAB — PROTIME-INR
INR: 1.08
PROTHROMBIN TIME: 13.9 s (ref 11.4–15.2)

## 2017-06-18 LAB — TROPONIN I: Troponin I: 0.14 ng/mL (ref <0.03)

## 2017-06-18 MED ORDER — POLYETHYLENE GLYCOL 3350 17 G PO PACK
17.0000 g | PACK | Freq: Once | ORAL | Status: AC
  Administered 2017-06-18: 17 g via ORAL
  Filled 2017-06-18: qty 1

## 2017-06-18 NOTE — Progress Notes (Signed)
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  LHC during last hospitalization showed clean coronaries, normal CO.  Discharged off Lasix.  Since discharge, was feeling fine, hadn't filled Entresto yet, then the morning of admission, woke with severe crampy LLQ abdominal pain, loose BM, came to ER.  In ER, CT abdomen WO contrast was normal, no SBO, diverticulitis, pancreatitis.  He was noted to have elevated troponin, new AKI with Cr 2.0 from dixcharge 1.1.  Admitted, started on IVF.  Troponin trend flat.     Assessment & Plan:  Active Problems:   Essential hypertension   Alcohol abuse   Chronic combined systolic (congestive) and diastolic (congestive) heart failure (HCC)   Nonischemic cardiomyopathy (H[facility])   AKI (acute kidney injury) (H[facility])   Epigastric pain   Elevated troponin   1. Abdominal pain, left lower quadrant. Unclear, CT abdomen yesterday unremarkable.  Viral GE?  This morning has new vomiting, still severe crampy pain, urge to defecate, can't.  No personal history of divertic or SBO. -Check AXR for SBO -Continue IVF -Continue hydrocodone or Dilaudid, ondansetron for symptomatic care -Trial Miralax   2. Acute kidney injury Baseline 1.1, admission 2.0, doubled.  Unclear cause.  Not taking Entresto for last two days.  Possible CIN?   -Hold Entresto -IV fluids  3. Elevated troponin Low level, no chest pain, in setting of AKI. -Consult to Cardiology appreciate cares  4. HTN and CHF -Continue amlodipine, BB -Continue amiodarone       DVT prophylaxis: Heparin Code Status: FULL Family Communication: none present Disposition Plan: IV fluids to continue, likely home tomorrow if AXR normal and symptoms  improved   Consultants:   Cardiology  Procedures:   None  Antimicrobials:   None    Subjective: Severe crampy LLQ pain, urge to defecate, obstipated.  VOmiting.  No fever, chills.  No new chest pain, dyspnea, leg swelling.    Objective: Vitals:   06/17/17 1900 06/17/17 2002 06/18/17 0105 06/18/17 0500  BP: (!) 160/87 (!) 191/66 125/73 (!) 146/79  Pulse: (!) 33 66 69 68  Resp: 16 17  18   Temp:  98 F (36.7 C) 98.3 F (36.8 C) 99.2 F (37.3 C)  TempSrc:  Oral Oral Oral  SpO2: 96% 97% 99% 100%  Weight:  71.7 kg (158 lb)  70.2 kg (154 lb 12.8 oz)  Height:  5\' 11"  (1.803 m)      Intake/Output Summary (Last 24 hours) at 06/18/2017 0938 Last data filed at 06/18/2017 0538 Gross per 24 hour  Intake 1171.25 ml  Output 1700 ml  Net -528.75 ml   Filed Weights   06/17/17 1235 06/17/17 2002 06/18/17 0500  Weight: 69.4 kg (153 lb) 71.7 kg (158 lb) 70.2 kg (154 lb 12.8 oz)    Examination: General appearance: THin  adult male, alert and in moderate distress from pain.   HEENT: Anicteric, conjunctiva pink, lids and lashes normal. No nasal deformity, discharge, epistaxis.  Lips moist.   Skin: Warm and dry.  No suspicious rashes or lesions. Cardiac: RRR, nl S1-S2, no murmurs appreciated.  Capillary refill is brisk.  JVP normal.  No LE edema.  Radial pulses 2+ and symmetric. Respiratory: Normal respiratory rate and rhythm.  CTAB without rales or  wheezes. Abdomen: Abdomen soft.  Marked LLQ TTP, with guarding, no rigidity. No ascites, distension, hepatosplenomegaly.   MSK: No deformities or effusions. Neuro: Awake and alert.  EOMI, moves all extremities. Speech fluent.    Psych: Sensorium intact and responding to questions, attention normal. Affect blunted by pain.  Judgment and insight appear normal.    Data Reviewed: I have personally reviewed following labs and imaging studies:  CBC: Recent Labs  Lab 06/12/17 0400 06/14/17 1419 06/17/17 1239 06/18/17 0339  WBC 7.5 6.7  11.1* 9.1  HGB 12.3* 14.5 13.8 14.1  HCT 37.4* 43.4 40.9 42.6  MCV 84.6 85.6 84.7 85.0  PLT 247 253 247 081   Basic Metabolic Panel: Recent Labs  Lab 06/12/17 0400 06/13/17 0631 06/14/17 1419 06/15/17 0613 06/17/17 1239 06/17/17 1642 06/18/17 0339  NA 138 138  --  138 137  --  136  K 4.1 3.9  --  3.3* 3.3*  --  3.5  CL 108 105  --  106 106  --  106  CO2 22 25  --  25 24  --  22  GLUCOSE 156* 117*  --  91 109*  --  104*  BUN 16 15  --  16 21*  --  17  CREATININE 1.15 0.97 1.25* 1.08 2.00*  --  1.65*  CALCIUM 9.1 9.0  --  8.7* 8.3*  --  8.1*  MG  --   --   --  1.7 1.8 1.8  --    GFR: Estimated Creatinine Clearance: 44.9 mL/min (A) (by C-G formula based on SCr of 1.65 mg/dL (H)). Liver Function Tests: Recent Labs  Lab 06/17/17 1239 06/18/17 0339  AST 24 47*  ALT 16* 30  ALKPHOS 59 61  BILITOT 0.9 1.0  PROT 5.5* 5.6*  ALBUMIN 3.0* 2.9*   Recent Labs  Lab 06/17/17 1239  LIPASE 17   No results for input(s): AMMONIA in the last 168 hours. Coagulation Profile: Recent Labs  Lab 06/12/17 0400 06/18/17 0339  INR 1.00 1.08   Cardiac Enzymes: Recent Labs  Lab 06/17/17 1642 06/17/17 2016 06/17/17 2245  TROPONINI 0.13* 0.14* 0.14*   BNP (last 3 results) No results for input(s): PROBNP in the last 8760 hours. HbA1C: No results for input(s): HGBA1C in the last 72 hours. CBG: No results for input(s): GLUCAP in the last 168 hours. Lipid Profile: No results for input(s): CHOL, HDL, LDLCALC, TRIG, CHOLHDL, LDLDIRECT in the last 72 hours. Thyroid Function Tests: No results for input(s): TSH, T4TOTAL, FREET4, T3FREE, THYROIDAB in the last 72 hours. Anemia Panel: No results for input(s): VITAMINB12, FOLATE, FERRITIN, TIBC, IRON, RETICCTPCT in the last 72 hours. Urine analysis:    Component Value Date/Time   COLORURINE STRAW (A) 06/17/2017 1658   APPEARANCEUR CLEAR 06/17/2017 1658   LABSPEC 1.010 06/17/2017 1658   PHURINE 6.0 06/17/2017 1658   GLUCOSEU NEGATIVE  06/17/2017 1658   HGBUR SMALL (A) 06/17/2017 1658   BILIRUBINUR NEGATIVE 06/17/2017 1658   KETONESUR NEGATIVE 06/17/2017 1658   PROTEINUR NEGATIVE 06/17/2017 1658   UROBILINOGEN 1.0 07/16/2013 1404   NITRITE NEGATIVE 06/17/2017 1658   LEUKOCYTESUR NEGATIVE 06/17/2017 1658   Sepsis Labs: @LABRCNTIP (procalcitonin:4,lacticidven:4)  )No results found for this or any previous visit (from the past 240 hour(s)).       Radiology Studies: Ct Abdomen Pelvis Wo Contrast  Result Date: 06/17/2017 CLINICAL DATA:  Left-sided abdominal pain with nausea and vomiting EXAM: CT ABDOMEN AND PELVIS WITHOUT CONTRAST TECHNIQUE: Multidetector CT imaging of the abdomen  and pelvis was performed following the standard protocol without IV contrast. COMPARISON:  09/21/2012 FINDINGS: Lower chest: Minimal scarring is noted in the right lower lobe. Hepatobiliary: No focal liver abnormality is seen. No gallstones, gallbladder wall thickening, or biliary dilatation. Pancreas: Unremarkable. No pancreatic ductal dilatation or surrounding inflammatory changes. Spleen: Normal in size without focal abnormality. Adrenals/Urinary Tract: Left adrenal gland is within normal limits. The right adrenal gland demonstrates some adjacent calcifications similar to that seen on prior plain film. These have a chronic appearance. The left kidney is mildly hypertrophic. No renal calculi or obstructive changes are noted. A right kidney is not visualized. It is uncertain whether this represents a congenital or postoperative change. The bladder is well distended. Stomach/Bowel: No obstructive changes are seen. The appendix is within normal limits. No inflammatory changes are noted. Vascular/Lymphatic: Aortic atherosclerosis. No enlarged abdominal or pelvic lymph nodes. Reproductive: Prostate is unremarkable. Other: No abdominal wall hernia or abnormality. No abdominopelvic ascites. Musculoskeletal: Degenerative changes of the lumbar spine are noted.  No acute bony abnormality is seen. 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 IMPRESSION: Right kidney is not visualized. It is uncertain if this represents a congenital anomaly or related to postsurgical change. Clinical correlation is recommended Calcifications adjacent to the right adrenal gland which are chronic over multiple previous exams dating back to 2010 No acute abnormality noted. Electronically Signed   By: Inez Catalina M.D.   On: 06/17/2017 14:33   Dg Chest 2 View  Result Date: 06/17/2017 CLINICAL DATA:  Chest pain. EXAM: CHEST  2 VIEW COMPARISON:  Radiographs of June 10, 2017. FINDINGS: The heart size and mediastinal contours are within normal limits. Both lungs are clear. No pneumothorax or pleural effusion is noted. The visualized skeletal structures are unremarkable. IMPRESSION: No active cardiopulmonary disease. Electronically Signed   By: Marijo Conception, M.D.   On: 06/17/2017 13:37        Scheduled Meds: . amiodarone  200 mg Oral Daily  . carvedilol  3.125 mg Oral BID WC  . folic acid  1 mg Oral Daily  . heparin  5,000 Units Subcutaneous Q8H  . multivitamin with minerals  1 tablet Oral Daily  . pantoprazole (PROTONIX) IV  40 mg Intravenous Q24H  . polyethylene glycol  17 g Oral Once  . thiamine  100 mg Oral Daily   Or  . thiamine  100 mg Intravenous Daily   Continuous Infusions: . sodium chloride 75 mL/hr at 06/18/17 0538     LOS: 0 days    Time spent: 25 minutes    Edwin Dada, MD [facility] Pager [phone]  If 7PM-7AM, please contact night-coverage [email or web address] Password [facility] 06/18/2017, 9:38 AM

## 2017-06-19 ENCOUNTER — Telehealth: Payer: Self-pay | Admitting: Internal Medicine

## 2017-06-19 DIAGNOSIS — K529 Noninfective gastroenteritis and colitis, unspecified: Secondary | ICD-10-CM

## 2017-06-19 DIAGNOSIS — N189 Chronic kidney disease, unspecified: Secondary | ICD-10-CM

## 2017-06-19 LAB — BASIC METABOLIC PANEL
Anion gap: 10 (ref 5–15)
BUN: 13 mg/dL (ref 6–20)
CO2: 22 mmol/L (ref 22–32)
CREATININE: 1.71 mg/dL — AB (ref 0.61–1.24)
Calcium: 8.2 mg/dL — ABNORMAL LOW (ref 8.9–10.3)
Chloride: 102 mmol/L (ref 101–111)
GFR calc non Af Amer: 41 mL/min — ABNORMAL LOW (ref >60)
GFR, EST AFRICAN AMERICAN: 47 mL/min — AB (ref >60)
Glucose, Bld: 95 mg/dL (ref 65–99)
Potassium: 3.8 mmol/L (ref 3.5–5.1)
Sodium: 134 mmol/L — ABNORMAL LOW (ref 135–145)

## 2017-06-19 LAB — URINALYSIS, ROUTINE W REFLEX MICROSCOPIC
BACTERIA UA: NONE SEEN
Bilirubin Urine: NEGATIVE
Glucose, UA: NEGATIVE mg/dL
Ketones, ur: NEGATIVE mg/dL
Leukocytes, UA: NEGATIVE
NITRITE: NEGATIVE
Protein, ur: 30 mg/dL — AB
RBC / HPF: NONE SEEN RBC/hpf (ref 0–5)
SPECIFIC GRAVITY, URINE: 1.023 (ref 1.005–1.030)
Squamous Epithelial / LPF: NONE SEEN
pH: 5 (ref 5.0–8.0)

## 2017-06-19 LAB — CBC
HCT: 41.6 % (ref 39.0–52.0)
Hemoglobin: 13.7 g/dL (ref 13.0–17.0)
MCH: 28 pg (ref 26.0–34.0)
MCHC: 32.9 g/dL (ref 30.0–36.0)
MCV: 85.1 fL (ref 78.0–100.0)
PLATELETS: 170 10*3/uL (ref 150–400)
RBC: 4.89 MIL/uL (ref 4.22–5.81)
RDW: 14.9 % (ref 11.5–15.5)
WBC: 14.8 10*3/uL — AB (ref 4.0–10.5)

## 2017-06-19 LAB — CREATININE, URINE, RANDOM: Creatinine, Urine: 174.86 mg/dL

## 2017-06-19 LAB — SODIUM, URINE, RANDOM: SODIUM UR: 74 mmol/L

## 2017-06-19 NOTE — Discharge Summary (Signed)
Physician Discharge Summary  CORTEZ FLIPPEN ZJQ:[id number] DOB: 1951/02/05 DOA: 06/17/2017  PCP: [facility]  Admit date: 06/17/2017 Discharge date: 06/19/2017  Admitted From: Home Disposition:  Home  Recommendations for Outpatient Follow-up:  1. Follow up with Cardiology in 4 days 2. Please obtain BMP at follow up   Home Health: No Equipment/Devices: No  Discharge Condition: Good  CODE STATUS: FULL Diet recommendation: Low sodium  Brief/Interim Summary: This is a 66 year old man with an ICM, CHF EF 25%, history of substance abuse, and frequent PVCs who was recently admitted for dyspnea, treated with steroids, doxycycline, and diuresis and improved.  RHC/LHC during the hospitalization showed normal course, compensated hemodynamics.  He was started on Entresto and discharged.  He got 2 days of Entresto in the hospital, tells me he never took the [place] after leaving.  The day of admission (a day after leaving the hospital), he woke up with left lower quadrant pain and 1 or 2 episodes of loose stools.  The crampy abdominal pain persisted so he came to the emergency room.  There CT abdomen showed no focal findings, he was admitted for gastroenteritis, treated with IV fluids and pain medicine and his symptoms resolved.  Diverticulitis considered, doubted given improvement with fluids alone, normal CT abdomen.    Elevated troponin Nonspecific, ACS doubted, no chest pain.  Acute kidney injury Baseline Cr 1.0, admitted at 2.0, improved only to 1.7 at discharge.  FeNA 0.6%.  UA without red cells.  Did not appear fluid overloaded.  Suspect CIN vs Entresto.  Entresto held at discharge.  Has only one kidney.  Close follow up with CHF clinic arranged.        Discharge Diagnoses:  Active Problems:   Essential hypertension   Alcohol abuse   Chronic combined systolic (congestive) and diastolic (congestive) heart failure (HCC)   Nonischemic cardiomyopathy  (HCC)   AKI (acute kidney injury) (HCC)   Epigastric pain   Elevated troponin    Discharge Instructions  Discharge Instructions    Diet - low sodium heart healthy   Complete by:  As directed    Discharge instructions   Complete by:  As directed    From Dr. Loleta Books: Do not start the Entresto (white square bottle)  Do continue taking the carvedilol and amiodarone.  Avoid NSAIDS (this is anything like ibuprofen, Aleve, Motrin, Advil), these are bad for the kidneys.  Drink plenty of fluids.  The Heart Failure office will call you Monday morning for your appointment on Tuesday.  If you have not heard from them by Monday afternoon, call them at [phone].   Increase activity slowly   Complete by:  As directed      Allergies as of 06/19/2017      Reactions   Penicillins    REACTION: "stomach pains"      Medication List    STOP taking these medications   ibuprofen 200 MG tablet Commonly known as:  ADVIL,MOTRIN   sacubitril-valsartan 49-51 MG Commonly known as:  ENTRESTO     TAKE these medications   albuterol 108 (90 Base) MCG/ACT inhaler Commonly known as:  PROVENTIL HFA;VENTOLIN HFA Inhale 1-2 puffs into the lungs every 6 (six) hours as needed for wheezing or shortness of breath.   albuterol (2.5 MG/3ML) 0.083% nebulizer solution Commonly known as:  PROVENTIL Take 3 mLs (2.5 mg total) by nebulization every 6 (six) hours as needed for wheezing or shortness of breath.   amiodarone 200 MG tablet Commonly known  as:  PACERONE Take 2 tablets (400 mg total) 2 (two) times daily for 7 days by mouth, THEN 1 tablet (200 mg total) 2 (two) times daily for 7 days. Start taking on:  06/15/2017   carvedilol 3.125 MG tablet Commonly known as:  COREG Take 1 tablet (3.125 mg total) 2 (two) times daily with a meal by mouth.       Allergies  Allergen Reactions  . Penicillins     REACTION: "stomach pains"    Consultations:  [facility], by  phone   Procedures/Studies: Ct Abdomen Pelvis Wo Contrast  Result Date: 06/17/2017 CLINICAL DATA:  Left-sided abdominal pain with nausea and vomiting EXAM: CT ABDOMEN AND PELVIS WITHOUT CONTRAST TECHNIQUE: Multidetector CT imaging of the abdomen and pelvis was performed following the standard protocol without IV contrast. COMPARISON:  09/21/2012 FINDINGS: Lower chest: Minimal scarring is noted in the right lower lobe. Hepatobiliary: No focal liver abnormality is seen. No gallstones, gallbladder wall thickening, or biliary dilatation. Pancreas: Unremarkable. No pancreatic ductal dilatation or surrounding inflammatory changes. Spleen: Normal in size without focal abnormality. Adrenals/Urinary Tract: Left adrenal gland is within normal limits. The right adrenal gland demonstrates some adjacent calcifications similar to that seen on prior plain film. These have a chronic appearance. The left kidney is mildly hypertrophic. No renal calculi or obstructive changes are noted. A right kidney is not visualized. It is uncertain whether this represents a congenital or postoperative change. The bladder is well distended. Stomach/Bowel: No obstructive changes are seen. The appendix is within normal limits. No inflammatory changes are noted. Vascular/Lymphatic: Aortic atherosclerosis. No enlarged abdominal or pelvic lymph nodes. Reproductive: Prostate is unremarkable. Other: No abdominal wall hernia or abnormality. No abdominopelvic ascites. Musculoskeletal: Degenerative changes of the lumbar spine are noted. No acute bony abnormality is seen. 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 IMPRESSION: Right kidney is not visualized. It is uncertain if this represents a congenital anomaly or related to postsurgical change. Clinical correlation is recommended Calcifications adjacent to the right adrenal gland which are chronic over multiple previous exams dating back to 2010 No acute abnormality noted. Electronically  Signed   By: Inez Catalina M.D.   On: 06/17/2017 14:33   Dg Chest 2 View  Result Date: 06/17/2017 CLINICAL DATA:  Chest pain. EXAM: CHEST  2 VIEW COMPARISON:  Radiographs of June 10, 2017. FINDINGS: The heart size and mediastinal contours are within normal limits. Both lungs are clear. No pneumothorax or pleural effusion is noted. The visualized skeletal structures are unremarkable. IMPRESSION: No active cardiopulmonary disease. Electronically Signed   By: Marijo Conception, M.D.   On: 06/17/2017 13:37   Dg Chest 2 View  Result Date: 06/10/2017 CLINICAL DATA:  Shortness of breath 3 days.  History of asthma. EXAM: CHEST  2 VIEW COMPARISON:  05/04/2017 and 10/29/2016 . FINDINGS: Lungs are adequately inflated without focal consolidation or effusion. Cardiomediastinal silhouette is within normal. There is moderate degenerative change of the spine. Mild stable wedging of several adjacent midthoracic vertebral bodies IMPRESSION: No active cardiopulmonary disease. Electronically Signed   By: Marin Olp M.D.   On: 06/10/2017 17:44   Dg Abd 2 Views  Result Date: 06/18/2017 CLINICAL DATA:  Abdominal pain EXAM: ABDOMEN - 2 VIEW COMPARISON:  CT abdomen and pelvis June 17, 2017 FINDINGS: Supine and upright images were obtained. There are several loops of borderline dilated colon. There is no appreciable small bowel dilatation. No appreciable air-fluid levels. No free air. Lung bases are clear. 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 IMPRESSION: Question early ileus or enteritis. Bowel obstruction not felt to be present. No free air. Trapped lung bases are clear. 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 Electronically Signed   By: Lowella Grip III M.D.   On: 06/18/2017 10:12       Subjective: [place] much better.  Still occasional fleeting crampy LLQ pain, no vomiting, no  diarrhea.  No fever, chills.    Discharge Exam: Vitals:   06/19/17 0651 06/19/17 1200  BP: 102/60 (!) 120/58  Pulse: 74 (!) 59  Resp:  20  Temp: 97.9 F (36.6 C) 98.2 F (36.8 C)  SpO2: 95% 98%   Vitals:   06/18/17 1400 06/18/17 1955 06/19/17 0651 06/19/17 1200  BP: (!) 154/79 129/67 102/60 (!) 120/58  Pulse: 65 72 74 (!) 59  Resp: 20 15  20   Temp: 98.1 F (36.7 C) 98 F (36.7 C) 97.9 F (36.6 C) 98.2 F (36.8 C)  TempSrc: Oral Oral Oral Oral  SpO2: 97% 97% 95% 98%  Weight:   69.4 kg (153 lb)   Height:        General: Pt is alert, awake, not in acute distress Cardiovascular: RRR, S1/S2 +, no rubs, no gallops Respiratory: CTA bilaterally, no wheezing, no rhonchi Abdominal: Soft, no tenderness in LLQ today, ND, bowel sounds + Extremities: no edema, no cyanosis    The results of significant diagnostics from this hospitalization (including imaging, microbiology, ancillary and laboratory) are listed below for reference.     Microbiology: Recent Results (from the past 240 hour(s))  Urine Culture     Status: Abnormal   Collection Time: 06/17/17  4:58 PM  Result Value Ref Range Status   Specimen Description URINE, RANDOM  Final   Special Requests NONE  Final   Culture <10,000 COLONIES/mL INSIGNIFICANT GROWTH (A)  Final   Report Status 06/18/2017 FINAL  Final     Labs: BNP (last 3 results) Recent Labs    05/04/17 0859 06/10/17 1710 06/17/17 1239  BNP 509.8* 298.6* 096.2*   Basic Metabolic Panel: Recent Labs  Lab 06/13/17 0631 06/14/17 1419 06/15/17 0613 06/17/17 1239 06/17/17 1642 06/18/17 0339 06/19/17 0724  NA 138  --  138 137  --  136 134*  K 3.9  --  3.3* 3.3*  --  3.5 3.8  CL 105  --  106 106  --  106 102  CO2 25  --  25 24  --  22 22  GLUCOSE 117*  --  91 109*  --  104* 95  BUN 15  --  16 21*  --  17 13  CREATININE 0.97 1.25* 1.08 2.00*  --  1.65* 1.71*  CALCIUM 9.0  --  8.7* 8.3*  --  8.1* 8.2*  MG  --   --  1.7 1.8 1.8  --   --    Liver  Function Tests: Recent Labs  Lab 06/17/17 1239 06/18/17 0339  AST 24 47*  ALT 16* 30  ALKPHOS 59 61  BILITOT 0.9 1.0  PROT 5.5* 5.6*  ALBUMIN 3.0* 2.9*   Recent Labs  Lab 06/17/17 1239  LIPASE 17   No results for input(s): AMMONIA in the last 168 hours. CBC: Recent Labs  Lab 06/14/17 1419 06/17/17 1239 06/18/17 0339 06/19/17 0724  WBC 6.7 11.1* 9.1 14.8*  HGB 14.5 13.8 14.1 13.7  HCT 43.4 40.9 42.6 41.6  MCV 85.6 84.7 85.0 85.1  PLT 253 247 205 170   Cardiac Enzymes: Recent Labs  Lab 06/17/17 1642 06/17/17 2016 06/17/17 2245  TROPONINI 0.13* 0.14* 0.14*   BNP: Invalid input(s): POCBNP CBG: No results for input(s): GLUCAP in the last 168 hours. D-Dimer No results for input(s): DDIMER in the last 72 hours. Hgb A1c No results for input(s): HGBA1C in the last 72 hours. Lipid Profile No results for input(s): CHOL, HDL, LDLCALC, TRIG, CHOLHDL, LDLDIRECT in the last 72 hours. Thyroid function studies No results for input(s): TSH, T4TOTAL, T3FREE, THYROIDAB in the last 72 hours.  Invalid input(s): FREET3 Anemia work up No results for input(s): VITAMINB12, FOLATE, FERRITIN, TIBC, IRON, RETICCTPCT in the last 72 hours. Urinalysis    Component Value Date/Time   COLORURINE STRAW (A) 06/17/2017 1658   APPEARANCEUR CLEAR 06/17/2017 1658   LABSPEC 1.010 06/17/2017 1658   PHURINE 6.0 06/17/2017 1658   GLUCOSEU NEGATIVE 06/17/2017 1658   HGBUR SMALL (A) 06/17/2017 1658   BILIRUBINUR NEGATIVE 06/17/2017 1658   KETONESUR NEGATIVE 06/17/2017 1658   PROTEINUR NEGATIVE 06/17/2017 1658   UROBILINOGEN 1.0 07/16/2013 1404   NITRITE NEGATIVE 06/17/2017 1658   LEUKOCYTESUR NEGATIVE 06/17/2017 1658   Sepsis Labs Invalid input(s): PROCALCITONIN,  WBC,  LACTICIDVEN Microbiology Recent Results (from the past 240 hour(s))  Urine Culture     Status: Abnormal   Collection Time: 06/17/17  4:58 PM  Result Value Ref Range Status   Specimen Description URINE, RANDOM  Final    Special Requests NONE  Final   Culture <10,000 COLONIES/mL INSIGNIFICANT GROWTH (A)  Final   Report Status 06/18/2017 FINAL  Final     Time coordinating discharge: Over 30 minutes  SIGNED:   Edwin Dada, MD  [facility] 06/19/2017, 6:47 PM   If 7PM-7AM, please contact night-coverage [email or web address] Password [email or web address]

## 2017-06-19 NOTE — Plan of Care (Signed)
  Clinical Measurements: Diagnostic test results will improve 06/19/2017 0445 - Progressing by Desirae Mancusi, Roma Kayser, RN

## 2017-06-19 NOTE — Telephone Encounter (Signed)
Received call from hospitalist   Pt admitted  Cr increased   Trop trivially increased  Flat 0.14 Loose BM x 2  Entresto held ? Renal insuff from cath  Plan to send patient home   Will need labs and eval next week  Will see about getting appt on Tues (sooner than 11/30)  Dorris Carnes

## 2017-06-19 NOTE — Telephone Encounter (Signed)
I will forward this message to Kevan Rosebush with [facility]F Clinic to help facilitate. (Offices are closed today). Martino Tompson PA-C

## 2017-06-19 NOTE — Progress Notes (Signed)
Patient for discharge home with no apparent distress noted. Medications and discharge instructions explained to the patient. He verbalized understanding. Copies given to him. IV saline lock and tele pack removed. CCMD notified.

## 2017-06-23 ENCOUNTER — Ambulatory Visit (HOSPITAL_COMMUNITY)
Admission: RE | Admit: 2017-06-23 | Discharge: 2017-06-23 | Disposition: A | Discharge status: Home / Self Care | Payer: Medicaid Other | Source: Ambulatory Visit | Attending: Internal Medicine | Admitting: Internal Medicine

## 2017-06-23 ENCOUNTER — Encounter (HOSPITAL_COMMUNITY): Payer: Self-pay

## 2017-06-23 VITALS — BP 152/80 | HR 52 | Ht 71.0 in | Wt 157.2 lb

## 2017-06-23 DIAGNOSIS — I428 Other cardiomyopathies: Secondary | ICD-10-CM | POA: Insufficient documentation

## 2017-06-23 DIAGNOSIS — I5022 Chronic systolic (congestive) heart failure: Secondary | ICD-10-CM | POA: Insufficient documentation

## 2017-06-23 DIAGNOSIS — J45909 Unspecified asthma, uncomplicated: Secondary | ICD-10-CM | POA: Diagnosis not present

## 2017-06-23 DIAGNOSIS — I10 Essential (primary) hypertension: Secondary | ICD-10-CM | POA: Diagnosis not present

## 2017-06-23 DIAGNOSIS — I493 Ventricular premature depolarization: Secondary | ICD-10-CM | POA: Diagnosis not present

## 2017-06-23 DIAGNOSIS — I110 Hypertensive heart disease with heart failure: Secondary | ICD-10-CM | POA: Diagnosis not present

## 2017-06-23 DIAGNOSIS — M1610 Unilateral primary osteoarthritis, unspecified hip: Secondary | ICD-10-CM | POA: Insufficient documentation

## 2017-06-23 DIAGNOSIS — M19012 Primary osteoarthritis, left shoulder: Secondary | ICD-10-CM | POA: Diagnosis not present

## 2017-06-23 DIAGNOSIS — Z8249 Family history of ischemic heart disease and other diseases of the circulatory system: Secondary | ICD-10-CM | POA: Diagnosis not present

## 2017-06-23 DIAGNOSIS — I5042 Chronic combined systolic (congestive) and diastolic (congestive) heart failure: Secondary | ICD-10-CM

## 2017-06-23 DIAGNOSIS — Q600 Renal agenesis, unilateral: Secondary | ICD-10-CM | POA: Diagnosis not present

## 2017-06-23 DIAGNOSIS — N179 Acute kidney failure, unspecified: Secondary | ICD-10-CM | POA: Diagnosis not present

## 2017-06-23 DIAGNOSIS — Z79899 Other long term (current) drug therapy: Secondary | ICD-10-CM | POA: Insufficient documentation

## 2017-06-23 DIAGNOSIS — Z880 Allergy status to penicillin: Secondary | ICD-10-CM | POA: Diagnosis not present

## 2017-06-23 DIAGNOSIS — E785 Hyperlipidemia, unspecified: Secondary | ICD-10-CM | POA: Diagnosis not present

## 2017-06-23 DIAGNOSIS — Z87891 Personal history of nicotine dependence: Secondary | ICD-10-CM | POA: Diagnosis not present

## 2017-06-23 LAB — BASIC METABOLIC PANEL
Anion gap: 8 (ref 5–15)
BUN: 11 mg/dL (ref 6–20)
CALCIUM: 8.5 mg/dL — AB (ref 8.9–10.3)
CHLORIDE: 107 mmol/L (ref 101–111)
CO2: 23 mmol/L (ref 22–32)
CREATININE: 1.65 mg/dL — AB (ref 0.61–1.24)
GFR, EST AFRICAN AMERICAN: 49 mL/min — AB (ref >60)
GFR, EST NON AFRICAN AMERICAN: 42 mL/min — AB (ref >60)
Glucose, Bld: 99 mg/dL (ref 65–99)
Potassium: 3.8 mmol/L (ref 3.5–5.1)
SODIUM: 138 mmol/L (ref 135–145)

## 2017-06-23 MED ORDER — SACUBITRIL-VALSARTAN 24-26 MG PO TABS: 1.0000 | ORAL_TABLET | Freq: Two times a day (BID) | ORAL | Status: DC

## 2017-06-23 MED ORDER — AMIODARONE HCL 200 MG PO TABS: 200.0000 mg | ORAL_TABLET | Freq: Two times a day (BID) | ORAL | 6 refills | Status: DC

## 2017-06-23 NOTE — Progress Notes (Signed)
Advanced Heart Failure Clinic Note   Primary Cardiologist: Dr. Gwenlyn Found HF: Dr. [name]   HPI:  Joe Garcia is a 66 y.o. 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   Admitted 11/14 - 06/15/17 with CP and DOE. Received steroids and nebs. BNP minimally elevated on arrival. Troponin negative.  R/LHC below with normal cors and compensated hemodynamics. 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   Re-admitted 11/21-11/23/18 with cramping abdominal pain and GI symptoms. CT abdomen with no negative findings, treated as gastroenteritis. AKI noted with rise of creatinine from 1.0 -> 2.0 on admit. Improved to 1.7 at discharge. Pt states he never picked up [facility] after he left the hospital initially.   Pt presents today for post hospital follow up. He has been feel great since he got out of the hospital the second time. Denies lightheadedness or dizziness. He has not been SOB and has had no chest pain. Denies palpitations. Mild orthopnea but sleeps on one pillow.  Taking all medications as directed, but still taking amiodarone 400 mg BID. He states he found out he only has one kidney around age 65, and has never had any problems that he knows of.   EKG today with Sinus bradycardia 52 bpm.   Review of systems complete and found to be negative unless listed in HPI.    Past Medical History:  Diagnosis Date  . Alcoholism ([place])   . Arthritis    hips, L shoulder  . Asthma   . CHF (congestive heart failure) ([place])   . Depression   . Family history of anesthesia complication   . Hyperlipidemia   . Hypertension    Current Outpatient Medications  Medication Sig Dispense Refill  . albuterol (PROVENTIL HFA;VENTOLIN HFA) 108 (90 BASE) MCG/ACT inhaler Inhale 1-2 puffs into the lungs every 6 (six) hours as needed for wheezing or shortness of breath. 1 Inhaler 0  . albuterol (PROVENTIL) (2.5 MG/3ML) 0.083% nebulizer solution Take 3 mLs (2.5 mg total) by  nebulization every 6 (six) hours as needed for wheezing or shortness of breath. 75 mL 12  . amiodarone (PACERONE) 200 MG tablet Take 2 tablets (400 mg total) 2 (two) times daily for 7 days by mouth, THEN 1 tablet (200 mg total) 2 (two) times daily for 7 days. 42 tablet 0  . carvedilol (COREG) 3.125 MG tablet Take 1 tablet (3.125 mg total) 2 (two) times daily with a meal by mouth. 60 tablet 0   No current facility-administered medications for this encounter.    Allergies  Allergen Reactions  . Penicillins     REACTION: "stomach pains"   Social History   Socioeconomic History  . Marital status: Single    Spouse name: Not on file  . Number of children: Not on file  . Years of education: Not on file  . Highest education level: Not on file  Social Needs  . Financial resource strain: Not on file  . Food insecurity - worry: Not on file  . Food insecurity - inability: Not on file  . Transportation needs - medical: Not on file  . Transportation needs - non-medical: Not on file  Occupational History  . Occupation: "it don't work for me", on disability  Tobacco Use  . Smoking status: Former Smoker    Packs/day: 0.10    Years: 45.00    Pack years: 4.50    Last attempt to quit: 2012    Years since quitting: 6.9  .  Smokeless tobacco: Never Used  Substance and Sexual Activity  . Alcohol use: No    Frequency: Never    Comment: Pt states no etoh since April 2014  . Drug use: No    Comment: Prior use of crack cocaine, quit 2012  . Sexual activity: No  Other Topics Concern  . Not on file  Social History Narrative  . Not on file    Family History  Problem Relation Age of Onset  . Heart disease Father   . Heart disease Daughter        "fluid around heart"    Vitals:   06/23/17 1034  BP: (!) 152/80  Pulse: (!) 52  SpO2: 99%  Weight: 157 lb 3.2 oz (71.3 kg)  Height: 5\' 11"  (1.803 m)   Wt Readings from Last 3 Encounters:  06/23/17 157 lb 3.2 oz (71.3 kg)  06/19/17 153 lb (69.4  kg)  06/15/17 153 lb 11.2 oz (69.7 kg)   PHYSICAL EXAM: General:  Well appearing. No respiratory difficulty.  HEENT: normal Neck: supple. JVP 6-7cm. Carotids 2+ bilat; no bruits. No lymphadenopathy or thyromegaly appreciated. Cor: PMI nondisplaced. Regular rate & rhythm. No rubs, gallops or murmurs. Lungs: clear Abdomen: soft, nontender, nondistended. No hepatosplenomegaly. No bruits or masses. Good bowel sounds. Extremities: no cyanosis, clubbing, rash, edema Neuro: alert & oriented x 3, cranial nerves grossly intact. moves all 4 extremities w/o difficulty. Affect pleasant.  ECG Sinus bradycardia 52 bpm. Personally reviewed.   ASSESSMENT & PLAN:  1. Chronic Systolic Heart Failure. ECHO 05/06/2017 EF 30-35%. NICM. 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 Well compensated hemodynamics.  - Volume status stable. NYHA II-III - Continue coreg 3.125 mg BID.  - Restart Entresto 24/26 mg BID. BMET today and next week. Follow very closely with recent AKI in setting of GI illness.  - Will not add spiro today, follow response to Entresto.  - Reinforced fluid restriction to < 2 L daily, sodium restriction to less than 2000 mg daily, and the importance of daily weights.   2. PVCs - Remains on amiodarone. - Decrease amiodarone to 200 mg BID for now.  3. HTN - Goal SBP < 130. - Meds as above.  4. Solitary Kidney, Congenital - BMET today.  - Follow renal function carefully with med adjustments.  5. Recent AKI - In setting of GI illness  - ? Contrast induced nephropathy from cath, but was nearly a week after.  - BMET today. Gentle med titration as above. Plenty of BP room.  BMET today and next week. Will carefully try back on low dose Entresto. Will provide 2 weeks of samples to make sure he tolerates.  RTC 2 weeks.   Shirley Friar, PA-C 06/23/17   Greater than 50% of the 25 minute visit was spent in counseling/coordination of care regarding disease state education,  salt/fluid restriction, sliding scale diuretics, and medication compliance.

## 2017-06-23 NOTE — Patient Instructions (Signed)
Routine lab work today. Will notify you of abnormal results, otherwise no news is good news!  DECREASE Amiodarone to 200 mg twice daily. May half your current 400 mg tablets you have at home (Take half tab twice daily). New Rx has been sent in for 200 mg tablets (Take 1 tab twice daily).  START Entresto 24/26 mg (1 tab) twice daily.  Return next week to repeat labs.  ______________________________________________________________ [place] Roberts Code: 4010  Follow up 2 weeks with Oda Kilts PA-C.  ______________________________________________________________ Marin Roberts Code: 8002  Take all medication as prescribed the day of your appointment. Bring all medications with you to your appointment.  Do the following things EVERYDAY: 1) Weigh yourself in the morning before breakfast. Write it down and keep it in a log. 2) Take your medicines as prescribed 3) Eat low salt foods-Limit salt (sodium) to 2000 mg per day.  4) Stay as active as you can everyday 5) Limit all fluids for the day to less than 2 liters

## 2017-06-26 ENCOUNTER — Encounter (HOSPITAL_COMMUNITY): Payer: Self-pay

## 2017-06-30 ENCOUNTER — Ambulatory Visit (HOSPITAL_COMMUNITY)
Admission: RE | Admit: 2017-06-30 | Discharge: 2017-06-30 | Disposition: A | Discharge status: Home / Self Care | Payer: Medicaid Other | Source: Ambulatory Visit | Attending: Cardiology | Admitting: Cardiology

## 2017-06-30 DIAGNOSIS — I5042 Chronic combined systolic (congestive) and diastolic (congestive) heart failure: Secondary | ICD-10-CM

## 2017-06-30 LAB — BASIC METABOLIC PANEL
ANION GAP: 9 (ref 5–15)
BUN: 17 mg/dL (ref 6–20)
CO2: 24 mmol/L (ref 22–32)
Calcium: 9 mg/dL (ref 8.9–10.3)
Chloride: 105 mmol/L (ref 101–111)
Creatinine, Ser: 1.86 mg/dL — ABNORMAL HIGH (ref 0.61–1.24)
GFR, EST AFRICAN AMERICAN: 42 mL/min — AB (ref >60)
GFR, EST NON AFRICAN AMERICAN: 37 mL/min — AB (ref >60)
GLUCOSE: 89 mg/dL (ref 65–99)
POTASSIUM: 4 mmol/L (ref 3.5–5.1)
SODIUM: 138 mmol/L (ref 135–145)

## 2017-07-06 ENCOUNTER — Encounter (HOSPITAL_COMMUNITY): Payer: Self-pay

## 2017-07-12 NOTE — Progress Notes (Signed)
Advanced Heart Failure Clinic Note   Primary Cardiologist: Dr. Gwenlyn Found HF: Dr. [name]  PCP:   HPI: Joe Garcia is a 66 y.o. 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 Former crack cocaine abuse.   Admitted 11/14 - 06/15/17 with CP and DOE. Received steroids and nebs. BNP minimally elevated on arrival. Troponin negative.  R/LHC below with normal cors and compensated hemodynamics. 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   Re-admitted 11/21-11/23/18 with cramping abdominal pain and GI symptoms. CT abdomen with no negative findings, treated as gastroenteritis. AKI noted with rise of creatinine from 1.0 -> 2.0 on admit. Improved to 1.7 at discharge. Pt states he never picked up [facility] after he left the hospital initially.   Today she returns for HF follow up. Last visit entresto was restarted. Says he ran out about a week ago. He was unable to read the pill botterhs. SOB with exertion. Denies PND/Orthopnea. Appetite fair. He is unable to walk around the grocery store. Taking all medications. Requires transportation assistance.   Review of systems complete and found to be negative unless listed in HPI.    Past Medical History:  Diagnosis Date  . Alcoholism ([place])   . Arthritis    hips, L shoulder  . Asthma   . CHF (congestive heart failure) ([place])   . Depression   . Family history of anesthesia complication   . Hyperlipidemia   . Hypertension    Current Outpatient Medications  Medication Sig Dispense Refill  . albuterol (PROVENTIL HFA;VENTOLIN HFA) 108 (90 BASE) MCG/ACT inhaler Inhale 1-2 puffs into the lungs every 6 (six) hours as needed for wheezing or shortness of breath. 1 Inhaler 0  . albuterol (PROVENTIL) (2.5 MG/3ML) 0.083% nebulizer solution Take 3 mLs (2.5 mg total) by nebulization every 6 (six) hours as needed for wheezing or shortness of breath. 75 mL 12  . amiodarone (PACERONE) 200 MG tablet Take 1 tablet (200 mg total)  by mouth 2 (two) times daily. 60 tablet 6  . carvedilol (COREG) 3.125 MG tablet Take 1 tablet (3.125 mg total) 2 (two) times daily with a meal by mouth. 60 tablet 0  . sacubitril-valsartan (ENTRESTO) 24-26 MG Take 1 tablet by mouth 2 (two) times daily. (Patient not taking: Reported on 07/13/2017) 60 tablet    No current facility-administered medications for this encounter.    Allergies  Allergen Reactions  . Penicillins     REACTION: "stomach pains"   Social History   Socioeconomic History  . Marital status: Single    Spouse name: Not on file  . Number of children: Not on file  . Years of education: Not on file  . Highest education level: Not on file  Social Needs  . Financial resource strain: Not on file  . Food insecurity - worry: Not on file  . Food insecurity - inability: Not on file  . Transportation needs - medical: Not on file  . Transportation needs - non-medical: Not on file  Occupational History  . Occupation: "it don't work for me", on disability  Tobacco Use  . Smoking status: Former Smoker    Packs/day: 0.10    Years: 45.00    Pack years: 4.50    Last attempt to quit: 2012    Years since quitting: 6.9  . Smokeless tobacco: Never Used  Substance and Sexual Activity  . Alcohol use: No    Frequency: Never    Comment: Pt states no etoh  since April 2014  . Drug use: No    Comment: Prior use of crack cocaine, quit 2012  . Sexual activity: No  Other Topics Concern  . Not on file  Social History Narrative  . Not on file    Family History  Problem Relation Age of Onset  . Heart disease Father   . Heart disease Daughter        "fluid around heart"    Vitals:   07/13/17 1156  BP: (!) 162/78  Pulse: (!) 58  SpO2: 100%  Weight: 153 lb 9.6 oz (69.7 kg)   Wt Readings from Last 3 Encounters:  06/23/17 157 lb 3.2 oz (71.3 kg)  06/19/17 153 lb (69.4 kg)  06/15/17 153 lb 11.2 oz (69.7 kg)   PHYSICAL EXAM: General: Thin. Well appearing. No resp  difficulty HEENT: normal Neck: supple. no JVD. Carotids 2+ bilat; no bruits. No lymphadenopathy or thryomegaly appreciated. Cor: PMI nondisplaced. Regular rate & rhythm. No rubs, gallops or murmurs. Lungs: clear Abdomen: soft, nontender, nondistended. No hepatosplenomegaly. No bruits or masses. Good bowel sounds. Extremities: no cyanosis, clubbing, rash, edema Neuro: alert & orientedx3, cranial nerves grossly intact. moves all 4 extremities w/o difficulty. Affect pleasant   EKG  Sinus Brady 51 bpm    ASSESSMENT & PLAN:  1. Chronic Systolic Heart Failure. ECHO 05/06/2017 EF 30-35%. NICM. 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 Well compensated hemodynamics.  - NYHA  III. Suspect dyspnea related to suspected COPD. He does not appear volume overloaded.  - Volume status stable. - Continue coreg 3.125 mg BID.  - Restart Entresto 24/26 mg BID. We called in prescription.  - Check BMET in 2 weeks.   2. PVCs - Cut back amio to 200 mg daily.  3. . HTN - Goal SBP < 130. - Elevated but restarted entresto as above.  4. Solitary Kidney, Congenital - BMET today.  - Follow renal function carefully with med adjustments.  5. Illiteracy Can not read pill bottlers.   Follow up in 2 weeks. Today I have talked to him about his medication problems and I suspect that it is due to illiteracy. He is unable to read the label on his medication bottles and he verified he cant ready. We need to get him involved with paramedicine.   Greater than 50% of the visit spent in counseling/coordination of care regarding paramedicine and medication changes.   Darrick Grinder, NP 07/12/17

## 2017-07-13 ENCOUNTER — Ambulatory Visit (HOSPITAL_BASED_OUTPATIENT_CLINIC_OR_DEPARTMENT_OTHER)
Admission: RE | Admit: 2017-07-13 | Discharge: 2017-07-13 | Disposition: A | Discharge status: Home / Self Care | Payer: Medicaid Other | Source: Ambulatory Visit | Attending: Cardiology | Admitting: Cardiology

## 2017-07-13 ENCOUNTER — Encounter (HOSPITAL_COMMUNITY): Payer: Self-pay

## 2017-07-13 ENCOUNTER — Telehealth (HOSPITAL_COMMUNITY): Payer: Self-pay | Admitting: Surgery

## 2017-07-13 VITALS — BP 162/78 | HR 58 | Wt 153.6 lb

## 2017-07-13 DIAGNOSIS — Z550 Illiteracy and low-level literacy: Secondary | ICD-10-CM | POA: Diagnosis not present

## 2017-07-13 DIAGNOSIS — F1411 Cocaine abuse, in remission: Secondary | ICD-10-CM | POA: Insufficient documentation

## 2017-07-13 DIAGNOSIS — J45909 Unspecified asthma, uncomplicated: Secondary | ICD-10-CM

## 2017-07-13 DIAGNOSIS — Z79899 Other long term (current) drug therapy: Secondary | ICD-10-CM | POA: Insufficient documentation

## 2017-07-13 DIAGNOSIS — I5022 Chronic systolic (congestive) heart failure: Secondary | ICD-10-CM

## 2017-07-13 DIAGNOSIS — I493 Ventricular premature depolarization: Secondary | ICD-10-CM

## 2017-07-13 DIAGNOSIS — Z9889 Other specified postprocedural states: Secondary | ICD-10-CM | POA: Insufficient documentation

## 2017-07-13 DIAGNOSIS — I429 Cardiomyopathy, unspecified: Secondary | ICD-10-CM

## 2017-07-13 DIAGNOSIS — Z87891 Personal history of nicotine dependence: Secondary | ICD-10-CM

## 2017-07-13 DIAGNOSIS — I110 Hypertensive heart disease with heart failure: Secondary | ICD-10-CM

## 2017-07-13 DIAGNOSIS — I5042 Chronic combined systolic (congestive) and diastolic (congestive) heart failure: Secondary | ICD-10-CM

## 2017-07-13 DIAGNOSIS — N179 Acute kidney failure, unspecified: Secondary | ICD-10-CM | POA: Insufficient documentation

## 2017-07-13 DIAGNOSIS — E785 Hyperlipidemia, unspecified: Secondary | ICD-10-CM

## 2017-07-13 DIAGNOSIS — Q600 Renal agenesis, unilateral: Secondary | ICD-10-CM | POA: Diagnosis not present

## 2017-07-13 DIAGNOSIS — E440 Moderate protein-calorie malnutrition: Secondary | ICD-10-CM | POA: Diagnosis not present

## 2017-07-13 DIAGNOSIS — Z880 Allergy status to penicillin: Secondary | ICD-10-CM | POA: Insufficient documentation

## 2017-07-13 DIAGNOSIS — J9600 Acute respiratory failure, unspecified whether with hypoxia or hypercapnia: Secondary | ICD-10-CM | POA: Diagnosis not present

## 2017-07-13 DIAGNOSIS — Z8249 Family history of ischemic heart disease and other diseases of the circulatory system: Secondary | ICD-10-CM

## 2017-07-13 DIAGNOSIS — I5043 Acute on chronic combined systolic (congestive) and diastolic (congestive) heart failure: Secondary | ICD-10-CM | POA: Diagnosis not present

## 2017-07-13 DIAGNOSIS — I130 Hypertensive heart and chronic kidney disease with heart failure and stage 1 through stage 4 chronic kidney disease, or unspecified chronic kidney disease: Secondary | ICD-10-CM | POA: Diagnosis not present

## 2017-07-13 MED ORDER — AMIODARONE HCL 200 MG PO TABS: 200.0000 mg | ORAL_TABLET | Freq: Every day | ORAL | 3 refills | Status: DC

## 2017-07-13 MED ORDER — SACUBITRIL-VALSARTAN 24-26 MG PO TABS: 1.0000 | ORAL_TABLET | Freq: Two times a day (BID) | ORAL | Status: DC

## 2017-07-13 NOTE — Telephone Encounter (Signed)
Patient was referred to the HF [employer].  I have sent all appropriate paperwork via secure email to the Paramedic team.

## 2017-07-13 NOTE — Patient Instructions (Addendum)
DECREASE Amiodarone to 200 mg tablet ONCE daily.  RESTART Entresto 24/26 mg tablet twice daily.  Follow up 2 weeks with Amy Clegg NP-C.  _________________________________________________________________ Marin Roberts Code: 2336  Take all medication as prescribed the day of your appointment. Bring all medications with you to your appointment.  Do the following things EVERYDAY:  1) Weigh yourself in the morning before breakfast. Write it down and keep it in a log. 2) Take your medicines as prescribed 3) Eat low salt foods-Limit salt (sodium) to 2000 mg per day.  4) Stay as active as you can everyday 5) Limit all fluids for the day to less than 2 liters

## 2017-07-14 ENCOUNTER — Inpatient Hospital Stay (HOSPITAL_COMMUNITY)
Admission: EM | Admit: 2017-07-14 | Discharge: 2017-07-16 | DRG: 291 | Disposition: A | Discharge status: Home / Self Care | Payer: Medicaid Other | Attending: Family Medicine | Admitting: Family Medicine

## 2017-07-14 ENCOUNTER — Other Ambulatory Visit: Payer: Self-pay

## 2017-07-14 ENCOUNTER — Emergency Department (HOSPITAL_COMMUNITY): Payer: Medicaid Other

## 2017-07-14 ENCOUNTER — Encounter (HOSPITAL_COMMUNITY): Payer: Self-pay | Admitting: Emergency Medicine

## 2017-07-14 DIAGNOSIS — I10 Essential (primary) hypertension: Secondary | ICD-10-CM | POA: Diagnosis present

## 2017-07-14 DIAGNOSIS — I5043 Acute on chronic combined systolic (congestive) and diastolic (congestive) heart failure: Secondary | ICD-10-CM | POA: Diagnosis present

## 2017-07-14 DIAGNOSIS — E440 Moderate protein-calorie malnutrition: Secondary | ICD-10-CM | POA: Diagnosis present

## 2017-07-14 DIAGNOSIS — R0603 Acute respiratory distress: Secondary | ICD-10-CM | POA: Diagnosis not present

## 2017-07-14 DIAGNOSIS — I429 Cardiomyopathy, unspecified: Secondary | ICD-10-CM | POA: Diagnosis present

## 2017-07-14 DIAGNOSIS — J9600 Acute respiratory failure, unspecified whether with hypoxia or hypercapnia: Secondary | ICD-10-CM | POA: Diagnosis present

## 2017-07-14 DIAGNOSIS — N182 Chronic kidney disease, stage 2 (mild): Secondary | ICD-10-CM | POA: Diagnosis present

## 2017-07-14 DIAGNOSIS — Z8249 Family history of ischemic heart disease and other diseases of the circulatory system: Secondary | ICD-10-CM | POA: Diagnosis not present

## 2017-07-14 DIAGNOSIS — I5042 Chronic combined systolic (congestive) and diastolic (congestive) heart failure: Secondary | ICD-10-CM | POA: Diagnosis present

## 2017-07-14 DIAGNOSIS — E785 Hyperlipidemia, unspecified: Secondary | ICD-10-CM | POA: Diagnosis present

## 2017-07-14 DIAGNOSIS — I130 Hypertensive heart and chronic kidney disease with heart failure and stage 1 through stage 4 chronic kidney disease, or unspecified chronic kidney disease: Secondary | ICD-10-CM | POA: Diagnosis present

## 2017-07-14 DIAGNOSIS — J45901 Unspecified asthma with (acute) exacerbation: Secondary | ICD-10-CM | POA: Diagnosis present

## 2017-07-14 DIAGNOSIS — Q600 Renal agenesis, unilateral: Secondary | ICD-10-CM

## 2017-07-14 DIAGNOSIS — Z87891 Personal history of nicotine dependence: Secondary | ICD-10-CM | POA: Diagnosis not present

## 2017-07-14 DIAGNOSIS — N179 Acute kidney failure, unspecified: Secondary | ICD-10-CM | POA: Diagnosis present

## 2017-07-14 DIAGNOSIS — I428 Other cardiomyopathies: Secondary | ICD-10-CM

## 2017-07-14 LAB — CBC WITH DIFFERENTIAL/PLATELET
Basophils Absolute: 0 10*3/uL (ref 0.0–0.1)
Basophils Relative: 0 %
EOS PCT: 7 %
Eosinophils Absolute: 0.4 10*3/uL (ref 0.0–0.7)
HCT: 42.2 % (ref 39.0–52.0)
Hemoglobin: 14.3 g/dL (ref 13.0–17.0)
LYMPHS ABS: 3.3 10*3/uL (ref 0.7–4.0)
LYMPHS PCT: 62 %
MCH: 28.3 pg (ref 26.0–34.0)
MCHC: 33.9 g/dL (ref 30.0–36.0)
MCV: 83.6 fL (ref 78.0–100.0)
MONO ABS: 0.4 10*3/uL (ref 0.1–1.0)
Monocytes Relative: 6 %
Neutro Abs: 1.4 10*3/uL — ABNORMAL LOW (ref 1.7–7.7)
Neutrophils Relative %: 25 %
PLATELETS: 286 10*3/uL (ref 150–400)
RBC: 5.05 MIL/uL (ref 4.22–5.81)
RDW: 13.9 % (ref 11.5–15.5)
WBC: 5.5 10*3/uL (ref 4.0–10.5)

## 2017-07-14 LAB — I-STAT ARTERIAL BLOOD GAS, ED
ACID-BASE DEFICIT: 5 mmol/L — AB (ref 0.0–2.0)
Bicarbonate: 20 mmol/L (ref 20.0–28.0)
O2 SAT: 99 %
PH ART: 7.335 — AB (ref 7.350–7.450)
PO2 ART: 162 mmHg — AB (ref 83.0–108.0)
TCO2: 21 mmol/L — ABNORMAL LOW (ref 22–32)
pCO2 arterial: 37.5 mmHg (ref 32.0–48.0)

## 2017-07-14 LAB — TROPONIN I
Troponin I: 0.07 ng/mL (ref <0.03)
Troponin I: 0.07 ng/mL (ref <0.03)

## 2017-07-14 LAB — COMPREHENSIVE METABOLIC PANEL
ALT: 11 U/L — ABNORMAL LOW (ref 17–63)
ANION GAP: 10 (ref 5–15)
AST: 15 U/L (ref 15–41)
Albumin: 3.7 g/dL (ref 3.5–5.0)
Alkaline Phosphatase: 79 U/L (ref 38–126)
BUN: 21 mg/dL — ABNORMAL HIGH (ref 6–20)
CHLORIDE: 106 mmol/L (ref 101–111)
CO2: 20 mmol/L — ABNORMAL LOW (ref 22–32)
CREATININE: 1.86 mg/dL — AB (ref 0.61–1.24)
Calcium: 9 mg/dL (ref 8.9–10.3)
GFR, EST AFRICAN AMERICAN: 42 mL/min — AB (ref >60)
GFR, EST NON AFRICAN AMERICAN: 37 mL/min — AB (ref >60)
Glucose, Bld: 169 mg/dL — ABNORMAL HIGH (ref 65–99)
POTASSIUM: 3.9 mmol/L (ref 3.5–5.1)
Sodium: 136 mmol/L (ref 135–145)
TOTAL PROTEIN: 7 g/dL (ref 6.5–8.1)
Total Bilirubin: 0.9 mg/dL (ref 0.3–1.2)

## 2017-07-14 LAB — MAGNESIUM: Magnesium: 2.2 mg/dL (ref 1.7–2.4)

## 2017-07-14 LAB — BRAIN NATRIURETIC PEPTIDE: B NATRIURETIC PEPTIDE 5: 434.2 pg/mL — AB (ref 0.0–100.0)

## 2017-07-14 MED ORDER — AMIODARONE HCL 200 MG PO TABS
200.0000 mg | ORAL_TABLET | Freq: Every day | ORAL | Status: DC
  Administered 2017-07-14 – 2017-07-16 (×3): 200 mg via ORAL
  Filled 2017-07-14 (×3): qty 1

## 2017-07-14 MED ORDER — ONDANSETRON HCL 4 MG/2ML IJ SOLN: 4.0000 mg | Freq: Four times a day (QID) | INTRAMUSCULAR | Status: DC | PRN

## 2017-07-14 MED ORDER — ENSURE ENLIVE PO LIQD
237.0000 mL | Freq: Two times a day (BID) | ORAL | Status: DC
  Administered 2017-07-15 – 2017-07-16 (×3): 237 mL via ORAL

## 2017-07-14 MED ORDER — ENOXAPARIN SODIUM 40 MG/0.4ML ~~LOC~~ SOLN
40.0000 mg | SUBCUTANEOUS | Status: DC
  Administered 2017-07-14: 40 mg via SUBCUTANEOUS
  Filled 2017-07-14 (×2): qty 0.4

## 2017-07-14 MED ORDER — IPRATROPIUM BROMIDE 0.02 % IN SOLN
0.5000 mg | Freq: Once | RESPIRATORY_TRACT | Status: AC
  Administered 2017-07-14: 0.5 mg via RESPIRATORY_TRACT
  Filled 2017-07-14: qty 2.5

## 2017-07-14 MED ORDER — BUDESONIDE 0.25 MG/2ML IN SUSP
0.2500 mg | Freq: Two times a day (BID) | RESPIRATORY_TRACT | Status: DC
  Administered 2017-07-14 – 2017-07-16 (×5): 0.25 mg via RESPIRATORY_TRACT
  Filled 2017-07-14 (×6): qty 2

## 2017-07-14 MED ORDER — FUROSEMIDE 10 MG/ML IJ SOLN
40.0000 mg | Freq: Two times a day (BID) | INTRAMUSCULAR | Status: DC
  Administered 2017-07-14 – 2017-07-15 (×2): 40 mg via INTRAVENOUS
  Filled 2017-07-14 (×2): qty 4

## 2017-07-14 MED ORDER — CARVEDILOL 3.125 MG PO TABS
3.1250 mg | ORAL_TABLET | Freq: Two times a day (BID) | ORAL | Status: DC
  Administered 2017-07-14 – 2017-07-16 (×5): 3.125 mg via ORAL
  Filled 2017-07-14 (×6): qty 1

## 2017-07-14 MED ORDER — HYDROCODONE-ACETAMINOPHEN 5-325 MG PO TABS: 1.0000 | ORAL_TABLET | ORAL | Status: DC | PRN

## 2017-07-14 MED ORDER — SENNOSIDES-DOCUSATE SODIUM 8.6-50 MG PO TABS: 1.0000 | ORAL_TABLET | Freq: Every evening | ORAL | Status: DC | PRN

## 2017-07-14 MED ORDER — ONDANSETRON HCL 4 MG PO TABS: 4.0000 mg | ORAL_TABLET | Freq: Four times a day (QID) | ORAL | Status: DC | PRN

## 2017-07-14 MED ORDER — ALBUTEROL SULFATE (2.5 MG/3ML) 0.083% IN NEBU
5.0000 mg | INHALATION_SOLUTION | Freq: Once | RESPIRATORY_TRACT | Status: AC
  Administered 2017-07-14: 5 mg via RESPIRATORY_TRACT
  Filled 2017-07-14: qty 6

## 2017-07-14 MED ORDER — SODIUM CHLORIDE 0.9 % IV SOLN: 250.0000 mL | INTRAVENOUS | Status: DC | PRN

## 2017-07-14 MED ORDER — FUROSEMIDE 10 MG/ML IJ SOLN
40.0000 mg | Freq: Two times a day (BID) | INTRAMUSCULAR | Status: DC
  Administered 2017-07-14: 40 mg via INTRAVENOUS
  Filled 2017-07-14: qty 4

## 2017-07-14 MED ORDER — ISOSORB DINITRATE-HYDRALAZINE 20-37.5 MG PO TABS
1.0000 | ORAL_TABLET | Freq: Three times a day (TID) | ORAL | Status: DC
  Administered 2017-07-14 – 2017-07-16 (×7): 1 via ORAL
  Filled 2017-07-14 (×9): qty 1

## 2017-07-14 MED ORDER — SODIUM CHLORIDE 0.9% FLUSH
3.0000 mL | Freq: Two times a day (BID) | INTRAVENOUS | Status: DC
  Administered 2017-07-14 – 2017-07-16 (×4): 3 mL via INTRAVENOUS

## 2017-07-14 MED ORDER — SODIUM CHLORIDE 0.9% FLUSH
3.0000 mL | INTRAVENOUS | Status: DC | PRN
  Administered 2017-07-14: 3 mL via INTRAVENOUS

## 2017-07-14 MED ORDER — IPRATROPIUM-ALBUTEROL 0.5-2.5 (3) MG/3ML IN SOLN
3.0000 mL | Freq: Four times a day (QID) | RESPIRATORY_TRACT | Status: DC
  Administered 2017-07-14 – 2017-07-15 (×5): 3 mL via RESPIRATORY_TRACT
  Filled 2017-07-14 (×5): qty 3

## 2017-07-14 MED ORDER — ENOXAPARIN SODIUM 40 MG/0.4ML ~~LOC~~ SOLN: 40.0000 mg | SUBCUTANEOUS | Status: DC

## 2017-07-14 NOTE — H&P (Signed)
History and Physical  Joe Garcia IRJ:[id number] DOB: [birth year] DOA: 07/14/2017  Referring physician: ER Physician PCP: Medicine, [facility]  Outpatient Specialists: Cardiology   Patient coming from: Home  Chief Complaint: SOB  HPI: Patient is a 66 year old African American male with past medical history significant for non ischemic cardiomyopathy with EF of 25 to 30% as per Cardiac cath done on 30/16/0109, grade 1 diastolic dysfunction, asthma and reformed alcoholic. Patient developed sudden onset of worsening of SOB last night when patient went to use the bathroom. According to the patient, the SOB was to the extent that he could not make it to the bathroom. Patient reported being dyspneic with minimal activity, at least 2 pillow orthopnea. No fever or chills, no neck pain, no chest pain, no GI symptoms and no urinary symptoms. On presentation, the cardiac BNP was elevated at 434, Scr was 1.86 (Scr was around 1 about 4 weeks ago). There were concerns for possible asthma exacerbation when patient presented, necessitating BiPAP, Magnesium, Steroid and Neb duoneb treatment. Patient is currently off BiPAP and no wheezing. As mentioned above, the onset of SOB was sudden.  ED Course: See above.  Pertinent labs: Scr of 1.86. BNP of 434. CXR report is noted. EKG: Independently reviewed.  Imaging: independently reviewed.   Review of Systems:  As in HPI. 12 points were reviewed. Negative for fever, visual changes, sore throat, rash, new muscle aches, chest pain, SOB, dysuria, bleeding, n/v/abdominal pain.  Past Medical History:  Diagnosis Date  . Alcoholism ([place])   . Arthritis    hips, L shoulder  . Asthma   . CHF (congestive heart failure) ([place])   . Depression   . Family history of anesthesia complication   . Hyperlipidemia   . Hypertension     Past Surgical History:  Procedure Laterality Date  . RIGHT/LEFT HEART CATH AND CORONARY ANGIOGRAPHY N/A 06/12/2017   Procedure: RIGHT/LEFT HEART CATH AND CORONARY ANGIOGRAPHY;  Surgeon: Jolaine Artist, MD;  Location: [facility];  Service: Cardiovascular;  Laterality: N/A;     reports that he quit smoking about 6 years ago. He has a 4.50 pack-year smoking history. he has never used smokeless tobacco. He reports that he does not drink alcohol or use drugs.  Allergies  Allergen Reactions  . Penicillins     REACTION: "stomach pains"    Family History  Problem Relation Age of Onset  . Heart disease Father   . Heart disease Daughter        "fluid around heart"      Prior to Admission medications   Medication Sig Start Date End Date Taking? Authorizing Provider  albuterol (PROVENTIL HFA;VENTOLIN HFA) 108 (90 BASE) MCG/ACT inhaler Inhale 1-2 puffs into the lungs every 6 (six) hours as needed for wheezing or shortness of breath. 07/12/15   Barrett, Lahoma Crocker, PA-C  albuterol (PROVENTIL) (2.5 MG/3ML) 0.083% nebulizer solution Take 3 mLs (2.5 mg total) by nebulization every 6 (six) hours as needed for wheezing or shortness of breath. 10/16/15   Kirichenko, Lahoma Rocker, PA-C  amiodarone (PACERONE) 200 MG tablet Take 1 tablet (200 mg total) by mouth daily. 07/13/17   Clegg, Amy D, NP  carvedilol (COREG) 3.125 MG tablet Take 1 tablet (3.125 mg total) 2 (two) times daily with a meal by mouth. 06/15/17   Dessa Phi, DO  sacubitril-valsartan (ENTRESTO) 24-26 MG Take 1 tablet by mouth 2 (two) times daily. 07/13/17   Conrad Trinway, NP    Physical  Exam: Vitals:   07/14/17 0800 07/14/17 0815 07/14/17 0830 07/14/17 0845  BP: 132/80 (!) 148/85 (!) 148/93 139/83  Pulse: 69 68 66 70  Resp:  18 14 18   Temp:      TempSrc:      SpO2: 98% 99% 100% 100%     Constitutional:  . Appears calm and comfortable Eyes:  . No pallor. No jaundice.  ENMT:  . external ears, nose appear normal Neck:  . Neck is supple. No JVD Respiratory:  . CTA bilaterally. No wheeze when I examined the patient.   Cardiovascular:    . O5D6, systolic murmur . No LE extremity edema   Abdomen:  . Abdomen is soft and non tender. Organs are difficult to assess. Neurologic:  . Awake and alert. . Moves all limbs.  Wt Readings from Last 3 Encounters:  07/13/17 69.7 kg (153 lb 9.6 oz)  06/23/17 71.3 kg (157 lb 3.2 oz)  06/19/17 69.4 kg (153 lb)    I have personally reviewed following labs and imaging studies  Labs on Admission:  CBC: Recent Labs  Lab 07/14/17 0444  WBC 5.5  NEUTROABS 1.4*  HGB 14.3  HCT 42.2  MCV 83.6  PLT 644   Basic Metabolic Panel: Recent Labs  Lab 07/14/17 0444  NA 136  K 3.9  CL 106  CO2 20*  GLUCOSE 169*  BUN 21*  CREATININE 1.86*  CALCIUM 9.0   Liver Function Tests: Recent Labs  Lab 07/14/17 0444  AST 15  ALT 11*  ALKPHOS 79  BILITOT 0.9  PROT 7.0  ALBUMIN 3.7   No results for input(s): LIPASE, AMYLASE in the last 168 hours. No results for input(s): AMMONIA in the last 168 hours. Coagulation Profile: No results for input(s): INR, PROTIME in the last 168 hours. Cardiac Enzymes: Recent Labs  Lab 07/14/17 0444  TROPONINI <0.03   BNP (last 3 results) No results for input(s): PROBNP in the last 8760 hours. HbA1C: No results for input(s): HGBA1C in the last 72 hours. CBG: No results for input(s): GLUCAP in the last 168 hours. Lipid Profile: No results for input(s): CHOL, HDL, LDLCALC, TRIG, CHOLHDL, LDLDIRECT in the last 72 hours. Thyroid Function Tests: No results for input(s): TSH, T4TOTAL, FREET4, T3FREE, THYROIDAB in the last 72 hours. Anemia Panel: No results for input(s): VITAMINB12, FOLATE, FERRITIN, TIBC, IRON, RETICCTPCT in the last 72 hours. Urine analysis:    Component Value Date/Time   COLORURINE YELLOW 06/19/2017 1451   APPEARANCEUR CLEAR 06/19/2017 1451   LABSPEC 1.023 06/19/2017 1451   PHURINE 5.0 06/19/2017 1451   GLUCOSEU NEGATIVE 06/19/2017 1451   HGBUR SMALL (A) 06/19/2017 1451   BILIRUBINUR NEGATIVE 06/19/2017 1451   KETONESUR  NEGATIVE 06/19/2017 1451   PROTEINUR 30 (A) 06/19/2017 1451   UROBILINOGEN 1.0 07/16/2013 1404   NITRITE NEGATIVE 06/19/2017 1451   LEUKOCYTESUR NEGATIVE 06/19/2017 1451   Sepsis Labs: @LABRCNTIP (procalcitonin:4,lacticidven:4) )No results found for this or any previous visit (from the past 240 hour(s)).    Radiological Exams on Admission: Dg Chest Portable 1 View  Result Date: 07/14/2017 CLINICAL DATA:  Shortness of breath tonight. EXAM: PORTABLE CHEST 1 VIEW COMPARISON:  Frontal and lateral views 06/17/2017 FINDINGS: Lower lung volumes from prior exam. The cardiomediastinal contours are normal. The lungs are clear. Pulmonary vasculature is normal. No consolidation, pleural effusion, or pneumothorax. No acute osseous abnormalities are seen. IMPRESSION: Low lung volumes without acute abnormality. Electronically Signed   By: Jeb Levering M.D.   On: 07/14/2017 05:44  EKG: Independently reviewed.   Principal Problem:   Acute respiratory distress Active Problems:   Essential hypertension   Chronic combined systolic (congestive) and diastolic (congestive) heart failure (HCC)   Nonischemic cardiomyopathy (HCC)   Solitary kidney, congenital   Asthma exacerbation   CKD (chronic kidney disease), stage II   Acute on chronic combined systolic and diastolic CHF (congestive heart failure) (HCC)   Assessment/Plan 1. Acute on chronic combined systolic and diastolic CHF 2. Acute respiratory failure likely secondary to above 3. Possible asthma with exacerbation (Based on earlier documentation) 4. AKI, may be related to severe CHF versus Entresto 5. Hypertension   Admit patient to telemetry (Patient is off the BiPAP)  Aggressive diuresis  Monitor renal function closely  Hold Entresto  Bidil  Continue Coreg  Low threshold to consult Cardiology  Neb Treatment  Peak flow daily  Pulmicort  Continue to assess need for Steroids if there is significant component of asthma  exacerbation to the patient's respiratory symptoms.  DVT prophylaxis:Lovenox Code Status: Full Family Communication: None Disposition Plan: Home eventually   Consults called: None   Admission status: Inpatient    Time spent: 63 minutes  Dana Allan, MD  [facility] Pager #: [phone] 7PM-7AM contact night coverage as above   07/14/2017, 9:26 AM

## 2017-07-14 NOTE — Plan of Care (Addendum)
Joe Garcia is a 66 year old male with pmh of heart failure and asthma who presents with acute respiratory distress.  Initially placed on BiPAP with decreased work of breathing.  Given 125 mg of Solu-Medrol and nebs.  Chest x-ray otherwise noted to be clear.  Suspected acute asthma exacerbation.  Accepted to a stepdown bed.  Trying to wean patient off BiPAP currently.

## 2017-07-14 NOTE — ED Notes (Signed)
NP Black with hospitalist team at bedside

## 2017-07-14 NOTE — ED Notes (Signed)
Heart Healthy diet breakfast tray ordered @ 0802.

## 2017-07-14 NOTE — Progress Notes (Signed)
Obtained ABG from patient and gave results to MD [facility]. Patient taken off of BIPAP and is now on room air with sats of 99%. Patient states he feels much better and does not feel short of breath at this time. BIPAP at bedside if needed.

## 2017-07-14 NOTE — ED Notes (Signed)
Heart Health diet lunch tray ordered @ 1327.

## 2017-07-14 NOTE — ED Notes (Signed)
Breakfast tray set up at bedside  

## 2017-07-14 NOTE — ED Provider Notes (Addendum)
[facility] EMERGENCY DEPARTMENT Provider Note   CSN: [id number] Arrival date & time: 07/14/17  0436     History   Chief Complaint Chief Complaint  Patient presents with  . Shortness of Breath   Level 5 caveat: Acuity of condition  HPI Joe Garcia is a 66 y.o. male.  The history is provided by the patient and medical records.  Patient is a 66 year old male with a history of congestive heart failure, asthma and nonischemic cardiomyopathy who is brought to the emergency department via EMS with increasing shortness of breath over the past 3 days.  On EMS arrival the patient was wheezing diffusely and was diaphoretic with room air sats of 80%.  He was placed on CPAP and brought to the emergency department.  In route he was given 2 g of magnesium 125 mg of Solu-Medrol as well as 5 mg of albuterol and 0.5 mg of Atrovent.  EMS reports significant improvement in his work of breathing.  Patient reports he feels better at this time.  History is limited because of ongoing shortness of breath and use of BiPAP at time of my evaluation.  Past Medical History:  Diagnosis Date  . Alcoholism ([place])   . Arthritis    hips, L shoulder  . Asthma   . CHF (congestive heart failure) ([place])   . Depression   . Family history of anesthesia complication   . Hyperlipidemia   . Hypertension     Patient Active Problem List   Diagnosis Date Noted  . Solitary kidney, congenital 06/23/2017  . AKI (acute kidney injury) ([place]) 06/17/2017  . Epigastric pain 06/17/2017  . Elevated troponin 06/17/2017  . Nonischemic cardiomyopathy ([place])   . Multifocal PVCs   . Chronic combined systolic (congestive) and diastolic (congestive) heart failure ([place]) 06/11/2017  . Influenza A 07/17/2013  . Influenza 07/16/2013  . Fever 07/16/2013  . Tachycardia 07/16/2013  . Alcohol abuse 09/25/2012  . Alcohol dependence ([place]) 09/22/2012  . Essential hypertension 03/22/2007  . DEGENERATIVE DISC DISEASE  03/22/2007  . 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 RIGHT/LEFT HEART CATH AND CORONARY ANGIOGRAPHY N/A 06/12/2017   Procedure: RIGHT/LEFT HEART CATH AND CORONARY ANGIOGRAPHY;  Surgeon: Jolaine Artist, MD;  Location: [facility];  Service: Cardiovascular;  Laterality: N/A;       Home Medications    Prior to Admission medications   Medication Sig Start Date End Date Taking? Authorizing Provider  albuterol (PROVENTIL HFA;VENTOLIN HFA) 108 (90 BASE) MCG/ACT inhaler Inhale 1-2 puffs into the lungs every 6 (six) hours as needed for wheezing or shortness of breath. 07/12/15   Barrett, Lahoma Crocker, PA-C  albuterol (PROVENTIL) (2.5 MG/3ML) 0.083% nebulizer solution Take 3 mLs (2.5 mg total) by nebulization every 6 (six) hours as needed for wheezing or shortness of breath. 10/16/15   Kirichenko, Lahoma Rocker, PA-C  amiodarone (PACERONE) 200 MG tablet Take 1 tablet (200 mg total) by mouth daily. 07/13/17   Clegg, Amy D, NP  carvedilol (COREG) 3.125 MG tablet Take 1 tablet (3.125 mg total) 2 (two) times daily with a meal by mouth. 06/15/17   Dessa Phi, DO  sacubitril-valsartan (ENTRESTO) 24-26 MG Take 1 tablet by mouth 2 (two) times daily. 07/13/17   Conrad Ko Olina, NP    Family History Family History  Problem Relation Age of Onset  . Heart disease Father   . Heart disease Daughter        "fluid around  heart"     Social History Social History   Tobacco Use  . Smoking status: Former Smoker    Packs/day: 0.10    Years: 45.00    Pack years: 4.50    Last attempt to quit: 2012    Years since quitting: 6.9  . Smokeless tobacco: Never Used  Substance Use Topics  . Alcohol use: No    Frequency: Never    Comment: Pt states no etoh since April 2014  . Drug use: No    Comment: Prior use of crack cocaine, quit 2012     Allergies   Penicillins   Review of Systems Review of Systems  Unable to perform ROS: Acuity of condition      Physical Exam Updated Vital Signs BP 116/79   Pulse 63   Temp (!) 96.8 F (36 C) (Temporal)   Resp (!) 27   SpO2 100%   Physical Exam  Constitutional: He is oriented to person, place, and time. He appears well-developed and well-nourished.  HENT:  Head: Normocephalic and atraumatic.  Eyes: EOM are normal.  Neck: Normal range of motion.  Cardiovascular: Normal rate and regular rhythm.  Pulmonary/Chest:  Mild wheezing.  Mild tachypnea.  Abdominal: Soft. He exhibits no distension. There is no tenderness.  Musculoskeletal: Normal range of motion.  Neurological: He is alert and oriented to person, place, and time.  Skin: Skin is warm and dry.  Psychiatric: He has a normal mood and affect. Judgment normal.  Nursing note and vitals reviewed.    ED Treatments / Results  Labs (all labs ordered are listed, but only abnormal results are displayed) Labs Reviewed  CBC WITH DIFFERENTIAL/PLATELET - Abnormal; Notable for the following components:      Result Value   Neutro Abs 1.4 (*)    All other components within normal limits  COMPREHENSIVE METABOLIC PANEL  TROPONIN I  BRAIN NATRIURETIC PEPTIDE    EKG  EKG Interpretation None       Radiology Dg Chest Portable 1 View  Result Date: 07/14/2017 CLINICAL DATA:  Shortness of breath tonight. EXAM: PORTABLE CHEST 1 VIEW COMPARISON:  Frontal and lateral views 06/17/2017 FINDINGS: Lower lung volumes from prior exam. The cardiomediastinal contours are normal. The lungs are clear. Pulmonary vasculature is normal. No consolidation, pleural effusion, or pneumothorax. No acute osseous abnormalities are seen. IMPRESSION: Low lung volumes without acute abnormality. Electronically Signed   By: Jeb Levering M.D.   On: 07/14/2017 05:44    Procedures .Critical Care Performed by: Jola Schmidt, MD Authorized by: Jola Schmidt, MD     Total critical care time: 32 minutes Critical care time was exclusive of separately billable  procedures and treating other patients. Critical care was necessary to treat or prevent imminent or life-threatening deterioration. Critical care was time spent personally by me on the following activities: development of treatment plan with patient and/or surrogate as well as nursing, discussions with consultants, evaluation of patient's response to treatment, examination of patient, obtaining history from patient or surrogate, ordering and performing treatments and interventions, ordering and review of laboratory studies, ordering and review of radiographic studies, pulse oximetry and re-evaluation of patient's condition.   Medications Ordered in ED Medications  albuterol (PROVENTIL) (2.5 MG/3ML) 0.083% nebulizer solution 5 mg (not administered)  ipratropium (ATROVENT) nebulizer solution 0.5 mg (not administered)     Initial Impression / Assessment and Plan / ED Course  I have reviewed the triage vital signs and the nursing notes.  Pertinent labs & imaging results  that were available during my care of the patient were reviewed by me and considered in my medical decision making (see chart for details).    Presenting in respiratory distress although it sounds like he is significantly improved since EMS initial arrival.  He remains on BiPAP at this time.  He states he feels better.  Patient will undergo labs and x-ray.  Suspect asthma exacerbation.  Mild wheezing on exam.  No significant rhonchi.  No rales.   6:53 AM Patient is feeling better at this time.  Will trial off of BiPAP.  ABG pending at this time.  Patient will be admitted to stepdown unit.  Case was discussed with tried hospitalist.  No signs of overt heart failure.  Suspect asthma exacerbation.  Patient reports he does have a history of asthma.  No signs of pneumonia.  Final Clinical Impressions(s) / ED Diagnoses   Final diagnoses:  Acute respiratory failure, unspecified whether with hypoxia or hypercapnia [facility])    ED  Discharge Orders    None       Jola Schmidt, MD 07/14/17 [place], MD 07/14/17 [phone]

## 2017-07-14 NOTE — ED Notes (Signed)
MD Campos at bedside.  

## 2017-07-14 NOTE — Progress Notes (Signed)
Patient states he may need new supplies for home nebulizer's.

## 2017-07-14 NOTE — ED Triage Notes (Signed)
Pt arrives via EMS from home on CPAP, ems reports hx asthma. Providing much contextual information regarding events leading to calling 911. EMS reports pt was wheezing, diaphoretic and room air sats 80 upon arrival. Placed on CPAP and given 2 g MAG, 125mg  solumedrol, 5mg  albuterol, and a duoneb. Pt responsive but not talking much. When asked if he's having pain, gestures to his abdomen.

## 2017-07-15 DIAGNOSIS — Q600 Renal agenesis, unilateral: Secondary | ICD-10-CM

## 2017-07-15 DIAGNOSIS — I10 Essential (primary) hypertension: Secondary | ICD-10-CM

## 2017-07-15 DIAGNOSIS — J9600 Acute respiratory failure, unspecified whether with hypoxia or hypercapnia: Secondary | ICD-10-CM

## 2017-07-15 DIAGNOSIS — I5043 Acute on chronic combined systolic (congestive) and diastolic (congestive) heart failure: Secondary | ICD-10-CM

## 2017-07-15 DIAGNOSIS — N179 Acute kidney failure, unspecified: Secondary | ICD-10-CM

## 2017-07-15 DIAGNOSIS — R0603 Acute respiratory distress: Secondary | ICD-10-CM

## 2017-07-15 LAB — BASIC METABOLIC PANEL
Anion gap: 11 (ref 5–15)
BUN: 33 mg/dL — ABNORMAL HIGH (ref 6–20)
CO2: 20 mmol/L — ABNORMAL LOW (ref 22–32)
Calcium: 9 mg/dL (ref 8.9–10.3)
Chloride: 102 mmol/L (ref 101–111)
Creatinine, Ser: 1.97 mg/dL — ABNORMAL HIGH (ref 0.61–1.24)
GFR calc Af Amer: 40 mL/min — ABNORMAL LOW (ref >60)
GFR calc non Af Amer: 34 mL/min — ABNORMAL LOW (ref >60)
Glucose, Bld: 127 mg/dL — ABNORMAL HIGH (ref 65–99)
Potassium: 4.1 mmol/L (ref 3.5–5.1)
Sodium: 133 mmol/L — ABNORMAL LOW (ref 135–145)

## 2017-07-15 LAB — CBC
HCT: 38.5 % — ABNORMAL LOW (ref 39.0–52.0)
Hemoglobin: 13.3 g/dL (ref 13.0–17.0)
MCH: 28.4 pg (ref 26.0–34.0)
MCHC: 34.5 g/dL (ref 30.0–36.0)
MCV: 82.1 fL (ref 78.0–100.0)
Platelets: 291 10*3/uL (ref 150–400)
RBC: 4.69 MIL/uL (ref 4.22–5.81)
RDW: 13.9 % (ref 11.5–15.5)
WBC: 7 10*3/uL (ref 4.0–10.5)

## 2017-07-15 MED ORDER — IPRATROPIUM-ALBUTEROL 0.5-2.5 (3) MG/3ML IN SOLN
3.0000 mL | Freq: Two times a day (BID) | RESPIRATORY_TRACT | Status: DC
  Administered 2017-07-15 – 2017-07-16 (×2): 3 mL via RESPIRATORY_TRACT
  Filled 2017-07-15 (×2): qty 3

## 2017-07-15 MED ORDER — HEPARIN SODIUM (PORCINE) 5000 UNIT/ML IJ SOLN
5000.0000 [IU] | Freq: Three times a day (TID) | INTRAMUSCULAR | Status: DC
  Administered 2017-07-15 – 2017-07-16 (×2): 5000 [IU] via SUBCUTANEOUS
  Filled 2017-07-15 (×2): qty 1

## 2017-07-15 NOTE — Plan of Care (Signed)
Education: Knowledge of General Education information will improve 07/15/2017 1538 - Progressing by Rolm Baptise, RN 07/15/2017 1034 - Progressing by Rolm Baptise, RN 07/15/2017 0802 - Progressing by Rolm Baptise, RN Knowledge of General Education information will improve 07/15/2017 1538 - Progressing by Rolm Baptise, RN 07/15/2017 1034 - Progressing by Rolm Baptise, RN 07/15/2017 0802 - Progressing by Rolm Baptise, RN   Education: Knowledge of General Education information will improve 07/15/2017 1538 - Progressing by Rolm Baptise, RN 07/15/2017 1034 - Progressing by Rolm Baptise, RN 07/15/2017 0802 - Progressing by Rolm Baptise, RN   Health Behavior/Discharge Planning: Ability to manage health-related needs will improve 07/15/2017 1538 - Progressing by Rolm Baptise, RN 07/15/2017 1034 - Progressing by Rolm Baptise, RN 07/15/2017 0802 - Progressing by Rolm Baptise, RN   Clinical Measurements: Ability to maintain clinical measurements within normal limits will improve 07/15/2017 1538 - Progressing by Rolm Baptise, RN 07/15/2017 1034 - Progressing by Rolm Baptise, RN 07/15/2017 0802 - Progressing by Rolm Baptise, RN Will remain free from infection 07/15/2017 1538 - Progressing by Rolm Baptise, RN 07/15/2017 1034 - Progressing by Rolm Baptise, RN 07/15/2017 0802 - Progressing by Rolm Baptise, RN Diagnostic test results will improve 07/15/2017 1538 - Progressing by Rolm Baptise, RN 07/15/2017 1034 - Progressing by Rolm Baptise, RN 07/15/2017 0802 - Progressing by Rolm Baptise, RN Respiratory complications will improve 07/15/2017 1538 - Progressing by Rolm Baptise, RN 07/15/2017 1034 - Progressing by Rolm Baptise, RN 07/15/2017 0802 - Progressing by Rolm Baptise, RN Cardiovascular complication will be avoided 07/15/2017 1538 - Progressing by Rolm Baptise, RN 07/15/2017 1034 - Progressing by Rolm Baptise, RN 07/15/2017 0802 - Progressing by Rolm Baptise, RN   Coping: Level of anxiety will decrease 07/15/2017 1538 - Progressing by Rolm Baptise, RN 07/15/2017 1034 - Progressing by Rolm Baptise, RN 07/15/2017 0802 - Progressing by Rolm Baptise, RN   Elimination: Will not experience complications related to bowel motility 07/15/2017 1538 - Progressing by Rolm Baptise, RN 07/15/2017 1034 - Progressing by Rolm Baptise, RN 07/15/2017 0802 - Progressing by Rolm Baptise, RN Will not experience complications related to urinary retention 07/15/2017 1538 - Progressing by Rolm Baptise, RN 07/15/2017 1034 - Progressing by Rolm Baptise, RN 07/15/2017 0802 - Progressing by Rolm Baptise, RN   Pain Managment: General experience of comfort will improve 07/15/2017 1538 - Progressing by Rolm Baptise, RN 07/15/2017 1034 - Progressing by Rolm Baptise, RN 07/15/2017 0802 - Progressing by Rolm Baptise, RN   Skin Integrity: Risk for impaired skin integrity will decrease 07/15/2017 1538 - Progressing by Rolm Baptise, RN 07/15/2017 1034 - Progressing by Rolm Baptise, RN 07/15/2017 0802 - Progressing by Rolm Baptise, RN   Spiritual Needs Ability to function at adequate level 07/15/2017 1538 - Progressing by Rolm Baptise, RN 07/15/2017 1034 - Progressing by Rolm Baptise, RN 07/15/2017 0802 - Progressing by Rolm Baptise, RN   Activity: Ability to tolerate increased activity will improve 07/15/2017 1538 - Progressing by Rolm Baptise, RN Note Walking with standby with no distress. 07/15/2017 1034 - Progressing by Rolm Baptise, RN 07/15/2017 0802 - Progressing by Rolm Baptise, RN   Cardiac: Ability to achieve and maintain adequate cardiopulmonary perfusion will improve 07/15/2017 1538 - Progressing by Rolm Baptise, RN  07/15/2017 1034 - Progressing by Rolm Baptise, RN 07/15/2017 0802 - Progressing by Rolm Baptise, RN    Health Behavior/Discharge Planning: Ability to safely manage health-related needs after discharge will improve 07/15/2017 1538 - Progressing by Rolm Baptise, RN 07/15/2017 1034 - Progressing by Rolm Baptise, RN 07/15/2017 0802 - Progressing by Rolm Baptise, RN

## 2017-07-15 NOTE — Discharge Instructions (Signed)
Nutrition Post Hospital Stay Proper nutrition can help your body recover from illness and injury.   Foods and beverages high in protein, vitamins, and minerals help rebuild muscle loss, promote healing, & reduce fall risk.   .In addition to eating healthy foods, a nutrition shake is an easy, delicious way to get the nutrition you need during and after your hospital stay  It is recommended that you continue to drink 2 bottles per day of:       Ensure for at least 1 month (30 days) after your hospital stay   Tips for adding a nutrition shake into your routine: As allowed, drink one with vitamins or medications instead of water or juice Enjoy one as a tasty mid-morning or afternoon snack Drink cold or make a milkshake out of it Drink one instead of milk with cereal or snacks Use as a coffee creamer   Available at the following grocery stores and pharmacies:           * [employer] * [employer] * [employer]  * [facility]          * [employer] * [employer]  * [facility]      * Target  * [employer]J's   * [facility]  * [employer]   * [facility] [phone]            For COUPONS visit: [email or web address]/join or [email or web address]   Suggested Substitutions Ensure Plus = Boost Plus = Carnation Breakfast Essentials = Boost Compact Ensure Active Clear = Boost Breeze Glucerna Shake = Boost Glucose Control = Carnation Breakfast Essentials SUGAR FREE       

## 2017-07-15 NOTE — Plan of Care (Signed)
  Health Behavior/Discharge Planning: Ability to manage health-related needs will improve 07/15/2017 0013 - Progressing by Tristan Schroeder, RN   Activity: Risk for activity intolerance will decrease 07/15/2017 0013 - Progressing by Tristan Schroeder, RN   Clinical Measurements: Respiratory complications will improve 07/15/2017 0013 - Progressing by Tristan Schroeder, RN

## 2017-07-15 NOTE — Progress Notes (Signed)
Patient was laying in his bed with Oxygen mask on. Breathing nurse on-site. Patient had lots of stories about family dynamics. Patient was very receptive and appreciative of chaplain's visit. Chaplain provided emotional support, reflective listening, compassionate presence and prayer.  Joe Garcia

## 2017-07-15 NOTE — Progress Notes (Signed)
Initial Nutrition Assessment  DOCUMENTATION CODES:   Non-severe (moderate) malnutrition in context of chronic illness  INTERVENTION:   -Continue Ensure Enlive po BID, each supplement provides 350 kcal and 20 grams of protein  NUTRITION DIAGNOSIS:   Moderate Malnutrition related to chronic illness(CHF) as evidenced by mild muscle depletion, energy intake < or equal to 75% for > or equal to 1 month.  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 Patient developed sudden onset of worsening of SOB last night when patient went to use the bathroom. According to the patient, the SOB was to the extent that he could not make it to the bathroom.   Pt admitted with CHF.   Spoke with pt at bedside. He reports a very poor appetite over the past month, since being discharge from previous hospitalization for similar complaints. He shares that he had a hearty appetite up until 3 days after discharge, where "I lost my appetite". Pt estimates he consumed 2 meals per day, which consisted of very small portions of food, mainly two chicken drumettes or a small bowl of cereal. Since being admitted, pt reports his appetite has returned ("because they got my breathing straight"). Pt consumed 100% of breakfast; he shares he is not hungry for lung yet because his breakfast was so large.   Pt also consumed an Ensure supplement earlier, which he enjoyed. He expressed interest in continuing supplements at home.   Pt shares UBW is around 165#. He estimates he has lost approximately 10# within the past  month, however, this is not consistent with wt hx. Also suspect that diuresis may be contributing to wt loss.   Discussed importance of good meal and supplement intake to promote healing, Encouraged pt to continue supplement during hospitalization and at discharge.    Labs reviewed: Na: 133, Glucose: 127.   NUTRITION - FOCUSED PHYSICAL EXAM:    Most Recent Value  Orbital Region  No depletion  Upper Arm Region  Mild depletion  Thoracic and Lumbar Region  No depletion  Buccal Region  No depletion  Temple Region  Mild depletion  Clavicle Bone Region  Mild depletion  Clavicle and Acromion Bone Region  No depletion  Scapular Bone Region  No depletion  Dorsal Hand  Mild depletion  Patellar Region  Mild depletion  Anterior Thigh Region  Mild depletion  Posterior Calf Region  Mild depletion  Edema (RD Assessment)  None  Hair  Reviewed  Eyes  Reviewed  Mouth  Reviewed  Skin  Reviewed  Nails  Reviewed       Diet Order:  Diet Heart Room service appropriate? Yes; Fluid consistency: Thin  EDUCATION NEEDS:   Education needs have been addressed  Skin:  Skin Assessment: Reviewed RN Assessment  Last BM:  07/14/17  Height:   Ht Readings from Last 1 Encounters:  07/14/17 5\' 11"  (1.803 m)    Weight:   Wt Readings from Last 1 Encounters:  07/15/17 151 lb 3.2 oz (68.6 kg)    Ideal Body Weight:  75.5 kg  BMI:  Body  mass index is 21.09 kg/m.  Estimated Nutritional Needs:   Kcal:  1700-1900  Protein:  90-105 grams  Fluid:  1.7-1.9 L    Bawi Lakins A. Jimmye Norman, RD, LDN, CDE Pager: [phone] After hours Pager: [phone]

## 2017-07-15 NOTE — Care Management Note (Signed)
Case Management Note  Patient Details  Name: Joe Garcia MRN: [id number] Date of Birth: 1950-09-18  Subjective/Objective:  Acute Resp Distress                Action/Plan: Patient lives at home; PCP: [facility], [facility]; has private insurance with Medicaid with prescription drug coverage; CM following for progression of care.  Expected Discharge Date:   possibly 07/18/2017               Expected Discharge Plan:  Home/Self Care  Discharge planning Services  CM Consult     Status of Service:  In process, will continue to follow  Sherrilyn Rist [phone] 07/15/2017, 11:50 AM

## 2017-07-15 NOTE — Plan of Care (Signed)
  Education: Knowledge of General Education information will improve 07/15/2017 1034 - Progressing by Rolm Baptise, RN 07/15/2017 0802 - Progressing by Rolm Baptise, RN Knowledge of General Education information will improve 07/15/2017 1034 - Progressing by Rolm Baptise, RN 07/15/2017 0802 - Progressing by Rolm Baptise, RN   Health Behavior/Discharge Planning: Ability to manage health-related needs will improve 07/15/2017 1034 - Progressing by Rolm Baptise, RN 07/15/2017 0802 - Progressing by Rolm Baptise, RN   Clinical Measurements: Ability to maintain clinical measurements within normal limits will improve 07/15/2017 1034 - Progressing by Rolm Baptise, RN 07/15/2017 0802 - Progressing by Rolm Baptise, RN Will remain free from infection 07/15/2017 1034 - Progressing by Rolm Baptise, RN 07/15/2017 0802 - Progressing by Rolm Baptise, RN Diagnostic test results will improve 07/15/2017 1034 - Progressing by Rolm Baptise, RN 07/15/2017 0802 - Progressing by Rolm Baptise, RN Respiratory complications will improve 07/15/2017 1034 - Progressing by Rolm Baptise, RN 07/15/2017 0802 - Progressing by Rolm Baptise, RN Cardiovascular complication will be avoided 07/15/2017 1034 - Progressing by Rolm Baptise, RN 07/15/2017 0802 - Progressing by Rolm Baptise, RN   Clinical Measurements: Ability to maintain clinical measurements within normal limits will improve 07/15/2017 1034 - Progressing by Rolm Baptise, RN 07/15/2017 0802 - Progressing by Rolm Baptise, RN Will remain free from infection 07/15/2017 1034 - Progressing by Rolm Baptise, RN 07/15/2017 0802 - Progressing by Rolm Baptise, RN Diagnostic test results will improve 07/15/2017 1034 - Progressing by Rolm Baptise, RN 07/15/2017 0802 - Progressing by Rolm Baptise, RN Respiratory complications will improve 07/15/2017 1034 - Progressing by Rolm Baptise, RN 07/15/2017  0802 - Progressing by Rolm Baptise, RN Cardiovascular complication will be avoided 07/15/2017 1034 - Progressing by Rolm Baptise, RN 07/15/2017 0802 - Progressing by Rolm Baptise, RN   Clinical Measurements: Cardiovascular complication will be avoided 07/15/2017 1034 - Progressing by Rolm Baptise, RN 07/15/2017 0802 - Progressing by Karyl Kinnier D, RN   Activity: Risk for activity intolerance will decrease 07/15/2017 1034 - Progressing by Rolm Baptise, RN 07/15/2017 0802 - Progressing by Rolm Baptise, RN   Nutrition: Adequate nutrition will be maintained 07/15/2017 1034 - Progressing by Rolm Baptise, RN 07/15/2017 0802 - Progressing by Rolm Baptise, RN   Coping: Level of anxiety will decrease 07/15/2017 1034 - Progressing by Rolm Baptise, RN 07/15/2017 0802 - Progressing by Rolm Baptise, RN

## 2017-07-15 NOTE — Progress Notes (Signed)
PROGRESS NOTE [facility]   ALEXANDAR WEISENBERGER   LZJ:[id number] DOB: 08-03-1950  DOA: 07/14/2017 PCP: [facility], [facility]   Brief Narrative:  Joe Garcia is a 66 year old male with medical history of nonischemic cardiomyopathy with EF of 25-30%.  Grade 1 diastolic dysfunction, asthma who presented to the emergency department complaining of sudden shortness of breath.  Upon ED evaluation BNP was found to be elevated, patient was in severe distress and was placed on BiPAP, nebulizer treatments and Lasix were given.  Lab findings with elevated creatinine of 1.86.  Patient was admitted  with working diagnosis of CHF exacerbation and started on IV Lasix.  Patient seen and examined, he reports his breathing is significantly better.  Subjective: Patient seen and examined reported that his breathing is significantly better, almost back to baseline.  Denies chest pain, shortness of breath, palpitations and dizziness.  No lower extremity edema.  Tolerating diet well and ambulating with no issues.  Assessment & Plan: Acute on chronic combined systolic and diastolic heart failure Patient treated with IV lasix and BiPAP, responded well to treatment  Negative ~ 1.5L  Holding Entresto for now given increase in Cr  Continue Coreg and Amiodarone   Acute respiratory failure secondary to CHF exacerbation Treat underlying conditions  Initially treated with BiPAP, off O2 supplementation   Acute renal failure -only 1 working Progressive kidney disease vs Entresto vs over diuresis  Will Lasix for now, as patient appear to be euvolemic  Monitor Cr in AM   DVT prophylaxis: Heparin  Code Status: Full code  Family Communication: None at bedside  Disposition Plan: Home in the next 24 hrs   Consultants:   None  Procedures:   None   Antimicrobials:  None     Objective: Vitals:   07/15/17 0146 07/15/17 0537 07/15/17 0851 07/15/17 1000  BP:  124/73  122/66  Pulse:  62   61  Resp:  18    Temp:  97.8 F (36.6 C)    TempSrc:  Oral    SpO2: 97% 98% 98%   Weight:  68.6 kg (151 lb 3.2 oz)    Height:        Intake/Output Summary (Last 24 hours) at 07/15/2017 1516 Last data filed at 07/15/2017 1000 Gross per 24 hour  Intake 480 ml  Output 1970 ml  Net -1490 ml   Filed Weights   07/14/17 1631 07/15/17 0537  Weight: 68.2 kg (150 lb 4.8 oz) 68.6 kg (151 lb 3.2 oz)    Examination:  General exam: Appears calm and comfortable  HEENT: AC/AT, PERRLA, OP moist and clear Respiratory system: Clear to auscultation. No wheezes,crackle or rhonchi Cardiovascular system: S1 & S2 heard, RRR. No JVD, murmurs, rubs or gallops Gastrointestinal system: Abdomen is nondistended, soft and nontender. Normal bowel sounds heard. Central nervous system: Alert and oriented. No focal neurological deficits. Extremities: b/l trace edema,  Skin: No rashes, lesions or ulcers Psychiatry:  Mood & affect appropriate.    Data Reviewed: I have personally reviewed following labs and imaging studies  CBC: Recent Labs  Lab 07/14/17 0444 07/15/17 0436  WBC 5.5 7.0  NEUTROABS 1.4*  --   HGB 14.3 13.3  HCT 42.2 38.5*  MCV 83.6 82.1  PLT 286 024   Basic Metabolic Panel: Recent Labs  Lab 07/14/17 0444 07/14/17 1542 07/15/17 0436  NA 136  --  133*  K 3.9  --  4.1  CL 106  --  102  CO2 20*  --  20*  GLUCOSE 169*  --  127*  BUN 21*  --  33*  CREATININE 1.86*  --  1.97*  CALCIUM 9.0  --  9.0  MG  --  2.2  --    GFR: Estimated Creatinine Clearance: 36.8 mL/min (A) (by C-G formula based on SCr of 1.97 mg/dL (H)). Liver Function Tests: Recent Labs  Lab 07/14/17 0444  AST 15  ALT 11*  ALKPHOS 79  BILITOT 0.9  PROT 7.0  ALBUMIN 3.7   No results for input(s): LIPASE, AMYLASE in the last 168 hours. No results for input(s): AMMONIA in the last 168 hours. Coagulation Profile: No results for input(s): INR, PROTIME in the last 168 hours. Cardiac Enzymes: Recent Labs    Lab 07/14/17 0444 07/14/17 1008 07/14/17 1548  TROPONINI <0.03 0.07* 0.07*   BNP (last 3 results) No results for input(s): PROBNP in the last 8760 hours. HbA1C: No results for input(s): HGBA1C in the last 72 hours. CBG: No results for input(s): GLUCAP in the last 168 hours. Lipid Profile: No results for input(s): CHOL, HDL, LDLCALC, TRIG, CHOLHDL, LDLDIRECT in the last 72 hours. Thyroid Function Tests: No results for input(s): TSH, T4TOTAL, FREET4, T3FREE, THYROIDAB in the last 72 hours. Anemia Panel: No results for input(s): VITAMINB12, FOLATE, FERRITIN, TIBC, IRON, RETICCTPCT in the last 72 hours. Sepsis Labs: No results for input(s): PROCALCITON, LATICACIDVEN in the last 168 hours.  No results found for this or any previous visit (from the past 240 hour(s)).    Radiology Studies: Dg Chest Portable 1 View  Result Date: 07/14/2017 CLINICAL DATA:  Shortness of breath tonight. EXAM: PORTABLE CHEST 1 VIEW COMPARISON:  Frontal and lateral views 06/17/2017 FINDINGS: Lower lung volumes from prior exam. The cardiomediastinal contours are normal. The lungs are clear. Pulmonary vasculature is normal. No consolidation, pleural effusion, or pneumothorax. No acute osseous abnormalities are seen. IMPRESSION: Low lung volumes without acute abnormality. Electronically Signed   By: Jeb Levering M.D.   On: 07/14/2017 05:44    Scheduled Meds: . amiodarone  200 mg Oral Daily  . budesonide (PULMICORT) nebulizer solution  0.25 mg Nebulization BID  . carvedilol  3.125 mg Oral BID WC  . enoxaparin (LOVENOX) injection  40 mg Subcutaneous Q24H  . feeding supplement (ENSURE ENLIVE)  237 mL Oral BID BM  . ipratropium-albuterol  3 mL Nebulization BID  . isosorbide-hydrALAZINE  1 tablet Oral TID  . sodium chloride flush  3 mL Intravenous Q12H   Continuous Infusions: . sodium chloride       LOS: 1 day    Time spent: Total of 25 minutes spent with pt, greater than 50% of which was spent in  discussion of  treatment, counseling and coordination of care   Chipper Oman, MD Pager: Text Page via [email or web address]   If 7PM-7AM, please contact night-coverage [email or web address] 07/15/2017, 3:16 PM

## 2017-07-16 DIAGNOSIS — E440 Moderate protein-calorie malnutrition: Secondary | ICD-10-CM

## 2017-07-16 DIAGNOSIS — N182 Chronic kidney disease, stage 2 (mild): Secondary | ICD-10-CM

## 2017-07-16 LAB — BASIC METABOLIC PANEL
Anion gap: 8 (ref 5–15)
BUN: 33 mg/dL — ABNORMAL HIGH (ref 6–20)
CO2: 25 mmol/L (ref 22–32)
Calcium: 8.8 mg/dL — ABNORMAL LOW (ref 8.9–10.3)
Chloride: 104 mmol/L (ref 101–111)
Creatinine, Ser: 1.97 mg/dL — ABNORMAL HIGH (ref 0.61–1.24)
GFR calc Af Amer: 40 mL/min — ABNORMAL LOW (ref >60)
GFR calc non Af Amer: 34 mL/min — ABNORMAL LOW (ref >60)
Glucose, Bld: 109 mg/dL — ABNORMAL HIGH (ref 65–99)
Potassium: 3.8 mmol/L (ref 3.5–5.1)
Sodium: 137 mmol/L (ref 135–145)

## 2017-07-16 MED ORDER — FUROSEMIDE 20 MG PO TABS: 20.0000 mg | ORAL_TABLET | Freq: Every day | ORAL | 11 refills | Status: DC

## 2017-07-16 MED ORDER — ACETAMINOPHEN 325 MG PO TABS
650.0000 mg | ORAL_TABLET | Freq: Four times a day (QID) | ORAL | Status: DC | PRN
  Administered 2017-07-16: 650 mg via ORAL
  Filled 2017-07-16: qty 2

## 2017-07-16 MED ORDER — LACTATED RINGERS IV BOLUS (SEPSIS)
500.0000 mL | Freq: Once | INTRAVENOUS | Status: DC
Start: 2017-07-16 — End: 2017-07-16

## 2017-07-16 MED ORDER — ISOSORB DINITRATE-HYDRALAZINE 20-37.5 MG PO TABS: 1.0000 | ORAL_TABLET | Freq: Three times a day (TID) | ORAL | 0 refills | Status: DC

## 2017-07-16 NOTE — Plan of Care (Signed)
  Clinical Measurements: Respiratory complications will improve 07/16/2017 0151 - Adequate for Discharge by Tristan Schroeder, RN   Education: Understanding of medication regimen will improve 07/16/2017 0151 - Adequate for Discharge by Tristan Schroeder, RN

## 2017-07-16 NOTE — Progress Notes (Signed)
Discharge instructions provided to patient including prescription, follow up appointment and diet. Patient verbalizes understanding of instructions.

## 2017-07-16 NOTE — Discharge Summary (Signed)
Physician Discharge Summary  Joe Garcia  EGB:[id number]  DOB: 1950-11-21  DOA: 07/14/2017 PCP: [facility], [facility]  Admit date: 07/14/2017 Discharge date: 07/16/2017  Admitted From: Home Disposition: Home  Recommendations for Outpatient Follow-up:  1. Follow up with PCP in 1-2 weeks 2. Arranged cardiology follow up, office will call you with an appointment  3. Please obtain BMP in one week close monitor of Cr   Discharge Condition: Stable  CODE STATUS: Full code  Diet recommendation: Heart Healthy / Low salt   Brief/Interim Summary: Joe Garcia is a 66 year old male with medical history of nonischemic cardiomyopathy with EF of 25-30%.  Grade 1 diastolic dysfunction, asthma who presented to the emergency department complaining of sudden shortness of breath.  Upon ED evaluation BNP was found to be elevated, patient was in severe distress and was placed on BiPAP, nebulizer treatments and Lasix were given.  Lab findings with elevated creatinine of 1.86.  Patient was admitted  with working diagnosis of CHF exacerbation and started on IV Lasix. Patient diuresed well and symptoms significantly improved. Patient Cr was slight elevated from baseline felt to be over diuresis and Entresto, good UOP. Patient's Delene Loll was held until follow up with cardiology. Patient was started in [place] was continued and a prescription for lasix was given. Patient stable for discharge    Subjective: Patient seen and examined, no SOB chest pain or palpitations, good UOP. No acute events overnight. Remains afebrile   Discharge Diagnoses/Hospital Course:  Principal Problem:   Acute respiratory distress Active Problems:   Essential hypertension   Chronic combined systolic (congestive) and diastolic (congestive) heart failure (HCC)   Nonischemic cardiomyopathy (HCC)   Solitary kidney, congenital   Asthma exacerbation   CKD (chronic kidney disease), stage II   Acute on chronic  combined systolic and diastolic CHF (congestive heart failure) (HCC)   Malnutrition of moderate degree  Acute on chronic combined systolic and diastolic heart failure ECHO on 04/2017 EF 30-35%, diffuse hypokinesis, G1DD   Patient treated with IV lasix and BiPAP, responded well to treatment  Negative ~ 2 L , weight down ~ 5lb during stay  Patient was started on Bidil, coreg was continued. Holding Entresto due to Cr - defer to cardiology when to resume  Will discharge on Lasix 20 mg daily  Follow up with CHF clinic arranged  Acute respiratory failure secondary to CHF exacerbation Treat underlying conditions  Initially treated with BiPAP, off O2 supplementation   Acute renal failure -only 1 working Progressive kidney disease vs Entresto vs over diuresis  Cr stable, upon d/c 1.97 Holding Entresto  May need nephrology evaluation as outpatient   All other chronic medical condition were stable during the hospitalization.  On the day of the discharge the patient's vitals were stable, and no other acute medical condition were reported by patient. the patient was felt safe to be discharge to home   Discharge Instructions  You were cared for by a hospitalist during your hospital stay. If you have any questions about your discharge medications or the care you received while you were in the hospital after you are discharged, you can call the unit and asked to speak with the hospitalist on call if the hospitalist that took care of you is not available. Once you are discharged, your primary care physician will handle any further medical issues. Please note that NO REFILLS for any discharge medications will be authorized once you are discharged, as it is imperative that you  return to your primary care physician (or establish a relationship with a primary care physician if you do not have one) for your aftercare needs so that they can reassess your need for medications and monitor your lab  values.  Discharge Instructions    Call MD for:  difficulty breathing, headache or visual disturbances   Complete by:  As directed    Call MD for:  extreme fatigue   Complete by:  As directed    Call MD for:  hives   Complete by:  As directed    Call MD for:  persistant dizziness or light-headedness   Complete by:  As directed    Call MD for:  persistant nausea and vomiting   Complete by:  As directed    Call MD for:  redness, tenderness, or signs of infection (pain, swelling, redness, odor or green/yellow discharge around incision site)   Complete by:  As directed    Call MD for:  severe uncontrolled pain   Complete by:  As directed    Call MD for:  temperature >100.4   Complete by:  As directed    Diet - low sodium heart healthy   Complete by:  As directed    Increase activity slowly   Complete by:  As directed      Allergies as of 07/16/2017      Reactions   Penicillins Other (See Comments)   "Stomach pains" Has patient had a PCN reaction causing immediate rash, facial/tongue/throat swelling, SOB or lightheadedness with hypotension: Unk Has patient had a PCN reaction causing severe rash involving mucus membranes or skin necrosis: Unk Has patient had a PCN reaction that required hospitalization: Unk Has patient had a PCN reaction occurring within the last 10 years: No If all of the above answers are "NO", then may proceed with Cephalosporin use.      Medication List    STOP taking these medications   ENTRESTO 49-51 MG Generic drug:  sacubitril-valsartan     TAKE these medications   acetaminophen 500 MG tablet Commonly known as:  TYLENOL Take 500-1,000 mg by mouth every 6 (six) hours as needed (for pain or headaches).   albuterol 108 (90 Base) MCG/ACT inhaler Commonly known as:  PROVENTIL HFA;VENTOLIN HFA Inhale 1-2 puffs into the lungs every 6 (six) hours as needed for wheezing or shortness of breath.   albuterol (2.5 MG/3ML) 0.083% nebulizer solution Commonly  known as:  PROVENTIL Take 3 mLs (2.5 mg total) by nebulization every 6 (six) hours as needed for wheezing or shortness of breath.   amiodarone 200 MG tablet Commonly known as:  PACERONE Take 1 tablet (200 mg total) by mouth daily.   carvedilol 3.125 MG tablet Commonly known as:  COREG Take 1 tablet (3.125 mg total) 2 (two) times daily with a meal by mouth.   furosemide 20 MG tablet Commonly known as:  LASIX Take 1 tablet (20 mg total) by mouth daily.   isosorbide-hydrALAZINE 20-37.5 MG tablet Commonly known as:  BIDIL Take 1 tablet by mouth 3 (three) times daily.   polyethylene glycol powder powder Commonly known as:  GLYCOLAX/MIRALAX Take 17 g by mouth daily as needed for mild constipation.      Follow-up Information    [facility].   Contact information: [place] [address] [phone]          Allergies  Allergen Reactions  . Penicillins Other (See Comments)    "Stomach pains" Has  patient had a PCN reaction causing immediate rash, facial/tongue/throat swelling, SOB or lightheadedness with hypotension: Unk Has patient had a PCN reaction causing severe rash involving mucus membranes or skin necrosis: Unk Has patient had a PCN reaction that required hospitalization: Unk Has patient had a PCN reaction occurring within the last 10 years: No If all of the above answers are "NO", then may proceed with Cephalosporin use.     Consultations:  None    Procedures/Studies: Ct Abdomen Pelvis Wo Contrast  Result Date: 06/17/2017 CLINICAL DATA:  Left-sided abdominal pain with nausea and vomiting EXAM: CT ABDOMEN AND PELVIS WITHOUT CONTRAST TECHNIQUE: Multidetector CT imaging of the abdomen and pelvis was performed following the standard protocol without IV contrast. COMPARISON:  09/21/2012 FINDINGS: Lower chest: Minimal scarring is noted in the right lower lobe. Hepatobiliary: No focal liver abnormality is seen. No gallstones,  gallbladder wall thickening, or biliary dilatation. Pancreas: Unremarkable. No pancreatic ductal dilatation or surrounding inflammatory changes. Spleen: Normal in size without focal abnormality. Adrenals/Urinary Tract: Left adrenal gland is within normal limits. The right adrenal gland demonstrates some adjacent calcifications similar to that seen on prior plain film. These have a chronic appearance. The left kidney is mildly hypertrophic. No renal calculi or obstructive changes are noted. A right kidney is not visualized. It is uncertain whether this represents a congenital or postoperative change. The bladder is well distended. Stomach/Bowel: No obstructive changes are seen. The appendix is within normal limits. No inflammatory changes are noted. Vascular/Lymphatic: Aortic atherosclerosis. No enlarged abdominal or pelvic lymph nodes. Reproductive: Prostate is unremarkable. Other: No abdominal wall hernia or abnormality. No abdominopelvic ascites. Musculoskeletal: Degenerative changes of the lumbar spine are noted. No acute bony abnormality is seen. 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 IMPRESSION: Right kidney is not visualized. It is uncertain if this represents a congenital anomaly or related to postsurgical change. Clinical correlation is recommended Calcifications adjacent to the right adrenal gland which are chronic over multiple previous exams dating back to 2010 No acute abnormality noted. Electronically Signed   By: Inez Catalina M.D.   On: 06/17/2017 14:33   Dg Chest 2 View  Result Date: 06/17/2017 CLINICAL DATA:  Chest pain. EXAM: CHEST  2 VIEW COMPARISON:  Radiographs of June 10, 2017. FINDINGS: The heart size and mediastinal contours are within normal limits. Both lungs are clear. No pneumothorax or pleural effusion is noted. The visualized skeletal structures are unremarkable. IMPRESSION: No active cardiopulmonary disease. Electronically Signed   By: Marijo Conception, M.D.   On:  06/17/2017 13:37   Dg Chest Portable 1 View  Result Date: 07/14/2017 CLINICAL DATA:  Shortness of breath tonight. EXAM: PORTABLE CHEST 1 VIEW COMPARISON:  Frontal and lateral views 06/17/2017 FINDINGS: Lower lung volumes from prior exam. The cardiomediastinal contours are normal. The lungs are clear. Pulmonary vasculature is normal. No consolidation, pleural effusion, or pneumothorax. No acute osseous abnormalities are seen. IMPRESSION: Low lung volumes without acute abnormality. Electronically Signed   By: Jeb Levering M.D.   On: 07/14/2017 05:44   Dg Abd 2 Views  Result Date: 06/18/2017 CLINICAL DATA:  Abdominal pain EXAM: ABDOMEN - 2 VIEW COMPARISON:  CT abdomen and pelvis June 17, 2017 FINDINGS: Supine and upright images were obtained. There are several loops of borderline dilated colon. There is no appreciable small bowel dilatation. No appreciable air-fluid levels. No free air. Lung bases are clear. 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 IMPRESSION: Question  early ileus or enteritis. Bowel obstruction not felt to be present. No free air. Trapped lung bases are clear. 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 Electronically Signed   By: Lowella Grip III M.D.   On: 06/18/2017 10:12    Discharge Exam: Vitals:   07/16/17 0846 07/16/17 0847  BP:    Pulse:    Resp:    Temp:    SpO2: 97% 97%   Vitals:   07/16/17 0006 07/16/17 0632 07/16/17 0846 07/16/17 0847  BP: (!) 125/54 (!) 145/67    Pulse: (!) 59 (!) 57    Resp: 18 18    Temp: 97.9 F (36.6 C) 97.8 F (36.6 C)    TempSrc: Oral Oral    SpO2: 95% 98% 97% 97%  Weight:  67.9 kg (149 lb 12.8 oz)    Height:        General: Pt is alert, awake, not in acute distress Cardiovascular: RRR, S1/S2 +, no rubs, no gallops Respiratory: CTA bilaterally, no wheezing, no rhonchi Abdominal: Soft, NT, ND, bowel  sounds + Extremities: no edema, no cyanosis  The results of significant diagnostics from this hospitalization (including imaging, microbiology, ancillary and laboratory) are listed below for reference.     Microbiology: No results found for this or any previous visit (from the past 240 hour(s)).   Labs: BNP (last 3 results) Recent Labs    06/10/17 1710 06/17/17 1239 07/14/17 0444  BNP 298.6* 226.0* 299.3*   Basic Metabolic Panel: Recent Labs  Lab 07/14/17 0444 07/14/17 1542 07/15/17 0436 07/16/17 0809  NA 136  --  133* 137  K 3.9  --  4.1 3.8  CL 106  --  102 104  CO2 20*  --  20* 25  GLUCOSE 169*  --  127* 109*  BUN 21*  --  33* 33*  CREATININE 1.86*  --  1.97* 1.97*  CALCIUM 9.0  --  9.0 8.8*  MG  --  2.2  --   --    Liver Function Tests: Recent Labs  Lab 07/14/17 0444  AST 15  ALT 11*  ALKPHOS 79  BILITOT 0.9  PROT 7.0  ALBUMIN 3.7   No results for input(s): LIPASE, AMYLASE in the last 168 hours. No results for input(s): AMMONIA in the last 168 hours. CBC: Recent Labs  Lab 07/14/17 0444 07/15/17 0436  WBC 5.5 7.0  NEUTROABS 1.4*  --   HGB 14.3 13.3  HCT 42.2 38.5*  MCV 83.6 82.1  PLT 286 291   Cardiac Enzymes: Recent Labs  Lab 07/14/17 0444 07/14/17 1008 07/14/17 1548  TROPONINI <0.03 0.07* 0.07*   BNP: Invalid input(s): POCBNP CBG: No results for input(s): GLUCAP in the last 168 hours. D-Dimer No results for input(s): DDIMER in the last 72 hours. Hgb A1c No results for input(s): HGBA1C in the last 72 hours. Lipid Profile No results for input(s): CHOL, HDL, LDLCALC, TRIG, CHOLHDL, LDLDIRECT in the last 72 hours. Thyroid function studies No results for input(s): TSH, T4TOTAL, T3FREE, THYROIDAB in the last 72 hours.  Invalid input(s): FREET3 Anemia work up No results for input(s): VITAMINB12, FOLATE, FERRITIN, TIBC, IRON, RETICCTPCT in the last 72 hours. Urinalysis    Component Value Date/Time   COLORURINE YELLOW 06/19/2017 [place] 06/19/2017 1451   LABSPEC 1.023 06/19/2017 1451   PHURINE 5.0 06/19/2017 1451   GLUCOSEU NEGATIVE 06/19/2017 1451   HGBUR SMALL (A) 06/19/2017 1451   BILIRUBINUR  NEGATIVE 06/19/2017 1451   KETONESUR NEGATIVE 06/19/2017 1451   PROTEINUR 30 (A) 06/19/2017 1451   UROBILINOGEN 1.0 07/16/2013 1404   NITRITE NEGATIVE 06/19/2017 1451   LEUKOCYTESUR NEGATIVE 06/19/2017 1451   Sepsis Labs Invalid input(s): PROCALCITONIN,  WBC,  LACTICIDVEN Microbiology No results found for this or any previous visit (from the past 240 hour(s)).   Time coordinating discharge: 32 minutes  SIGNED:  Chipper Oman, MD  [facility] 07/16/2017, 10:51 AM  Pager please text page via  [email or web address] Password [email or web address]

## 2017-07-17 ENCOUNTER — Telehealth (HOSPITAL_COMMUNITY): Payer: Self-pay

## 2017-07-17 NOTE — Telephone Encounter (Signed)
I called pt to arrange a time to have a CHP visit and drop off the groceries that [place] from the heart failure clinic brought. Pt called back and stated that he is about to leave his house and he doesn't know when he'll be back.  He asked me to leave the food on the porch of his place, I confirmed with him that those were his wishes.  Pt scheduled our [facility] appointment for 12/28.

## 2017-07-24 ENCOUNTER — Other Ambulatory Visit (HOSPITAL_COMMUNITY): Payer: Self-pay

## 2017-07-24 ENCOUNTER — Encounter (HOSPITAL_COMMUNITY): Payer: Self-pay

## 2017-07-24 ENCOUNTER — Other Ambulatory Visit (HOSPITAL_COMMUNITY): Payer: Self-pay | Admitting: Pharmacist

## 2017-07-24 MED ORDER — CARVEDILOL 3.125 MG PO TABS: 3.1250 mg | ORAL_TABLET | Freq: Two times a day (BID) | ORAL | 5 refills | Status: DC

## 2017-07-24 NOTE — Progress Notes (Signed)
Paramedicine Encounter    Patient ID: Joe Garcia, male    DOB: Jun 18, 1951, 65 y.o.   MRN: [id number]    Patient Care Team: [facility] as PCP - General  Patient Active Problem List   Diagnosis Date Noted  . Malnutrition of moderate degree 07/16/2017  . Asthma exacerbation 07/14/2017  . Acute respiratory distress 07/14/2017  . 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 Acute on chronic combined systolic and diastolic CHF (congestive heart failure) ([place]) 07/14/2017  . Solitary kidney, congenital 06/23/2017  . AKI (acute kidney injury) ([place]) 06/17/2017  . Epigastric pain 06/17/2017  . Elevated troponin 06/17/2017  . Nonischemic cardiomyopathy ([place])   . Multifocal PVCs   . Chronic combined systolic (congestive) and diastolic (congestive) heart failure ([place]) 06/11/2017  . Influenza A 07/17/2013  . Influenza 07/16/2013  . Fever 07/16/2013  . Tachycardia 07/16/2013  . Alcohol abuse 09/25/2012  . Alcohol dependence ([place]) 09/22/2012  . Essential hypertension 03/22/2007  . DEGENERATIVE DISC DISEASE 03/22/2007  . AVASCULAR NECROSIS 03/22/2007    Current Outpatient Medications:  .  albuterol (PROVENTIL HFA;VENTOLIN HFA) 108 (90 BASE) MCG/ACT inhaler, Inhale 1-2 puffs into the lungs every 6 (six) hours as needed for wheezing or shortness of breath., Disp: 1 Inhaler, Rfl: 0 .  albuterol (PROVENTIL) (2.5 MG/3ML) 0.083% nebulizer solution, Take 3 mLs (2.5 mg total) by nebulization every 6 (six) hours as needed for wheezing or shortness of breath., Disp: 75 mL, Rfl: 12 .  amiodarone (PACERONE) 200 MG tablet, Take 1 tablet (200 mg total) by mouth daily., Disp: 90 tablet, Rfl: 3 .  carvedilol (COREG) 3.125 MG tablet, Take 1 tablet (3.125 mg total) by mouth 2 (two) times daily with a meal., Disp: 60 tablet, Rfl: 5 .  furosemide (LASIX) 20 MG tablet, Take 1 tablet (20 mg total) by mouth daily., Disp: 30 tablet, Rfl: 11 .  isosorbide-hydrALAZINE (BIDIL)  20-37.5 MG tablet, Take 1 tablet by mouth 3 (three) times daily., Disp: 90 tablet, Rfl: 0 .  polyethylene glycol powder (GLYCOLAX/MIRALAX) powder, Take 17 g by mouth daily as needed for mild constipation., Disp: , Rfl:  .  acetaminophen (TYLENOL) 500 MG tablet, Take 500-1,000 mg by mouth every 6 (six) hours as needed (for pain or headaches)., Disp: , Rfl:  Allergies  Allergen Reactions  . Penicillins Other (See Comments)    "Stomach pains" Has patient had a PCN reaction causing immediate rash, facial/tongue/throat swelling, SOB or lightheadedness with hypotension: Unk Has patient had a PCN reaction causing severe rash involving mucus membranes or skin necrosis: Unk Has patient had a PCN reaction that required hospitalization: Unk Has patient had a PCN reaction occurring within the last 10 years: No If all of the above answers are "NO", then may proceed with Cephalosporin use.      Social History   Socioeconomic History  . Marital status: Single    Spouse name: Not on file  . Number of children: Not on file  . Years of education: Not on file  . Highest education level: Not on file  Social Needs  . Financial resource strain: Not on file  . Food insecurity - worry: Not on file  . Food insecurity - inability: Not on file  . Transportation needs - medical: Not on file  . Transportation needs - non-medical: Not on file  Occupational History  . Occupation: "it don't work for me", on disability  Tobacco Use  . Smoking status: Former Smoker  Packs/day: 0.10    Years: 45.00    Pack years: 4.50    Last attempt to quit: 2012    Years since quitting: 6.9  . Smokeless tobacco: Never Used  Substance and Sexual Activity  . Alcohol use: No    Frequency: Never    Comment: Pt states no etoh since April 2014  . Drug use: No    Comment: Prior use of crack cocaine, quit 2012  . Sexual activity: No  Other Topics Concern  . Not on file  Social History Narrative  . Not on file     Physical Exam  Pulmonary/Chest: No respiratory distress. He has no wheezes. He has no rales.  Abdominal: He exhibits no distension. There is no tenderness. There is no guarding.  Musculoskeletal: He exhibits no edema.  Skin: Skin is warm and dry. He is not diaphoretic.        Future Appointments  Date Time Provider [place]  07/31/2017 12:00 PM [facility] PA/NP [facility] None    ATF pt CAO X4 sitting in the living room with no complaints. Pt stated that he looked up "bidil" and he hasn't been taking them due to the possible side effects that he may have after he stop taking them.  We discussed the side effects and talked about all of his medications.  Pt wanted to know why he was advised to take the meds that he is curretly on and what they are for.  I advised pt that he will have an appointment with the pharmacist soon if he has any further questions that I cant answer.  Pt agreed to try the pill box.  He was missing carvedilol and he can't tell me when was the last time he took it.  The refills was out therefore I asked Doroteo Bradford to [facility] [address], which she did.  Pt stated that he was told before that he had heart problems but he didn't realize how bad the condition was until he got sick this last time.  Pt stated that he had been feeling "full around his groin area" but he was mostly worried about having ashtma, therefore he over looked his symptoms.  Pt and I discussed the warning signs and the importance of taking his medications, weighing daily and watching what he eats.  He stated that he doesn't eat salt but he couldn't tell me which foods to stay away from, so we went over those foods.  Pt has medicaid and he stated that he usually don't have any trouble getting his medications. However, pt is interested in getting his medications delivered.  Pt has a scale at home that he didn't use until today.  I showed him how to record his readings.  Pt  agrees to allow [facility] visits and he signed the HIPPA form.  Pt has his next appointment next week at the [facility], I advised him that I will meet him there.    BP (!) 158/84 (BP Location: Left Arm, Patient Position: Sitting, Cuff Size: Normal)   Pulse 67   Resp 16   Wt 153 lb 9.6 oz (69.7 kg)   SpO2 98%   BMI 21.42 kg/m      Toniqua Melamed, EMT Paramedic 07/24/2017    ACTION: Home visit completed Next visit planned for next friday

## 2017-07-26 ENCOUNTER — Other Ambulatory Visit: Payer: Self-pay

## 2017-07-26 ENCOUNTER — Emergency Department (HOSPITAL_COMMUNITY)
Admission: EM | Admit: 2017-07-26 | Discharge: 2017-07-26 | Disposition: A | Discharge status: Home / Self Care | Payer: Medicaid Other | Attending: Emergency Medicine | Admitting: Emergency Medicine

## 2017-07-26 ENCOUNTER — Emergency Department (HOSPITAL_COMMUNITY): Payer: Medicaid Other

## 2017-07-26 ENCOUNTER — Encounter (HOSPITAL_COMMUNITY): Payer: Self-pay | Admitting: Emergency Medicine

## 2017-07-26 DIAGNOSIS — I130 Hypertensive heart and chronic kidney disease with heart failure and stage 1 through stage 4 chronic kidney disease, or unspecified chronic kidney disease: Secondary | ICD-10-CM | POA: Insufficient documentation

## 2017-07-26 DIAGNOSIS — I5042 Chronic combined systolic (congestive) and diastolic (congestive) heart failure: Secondary | ICD-10-CM | POA: Diagnosis not present

## 2017-07-26 DIAGNOSIS — J441 Chronic obstructive pulmonary disease with (acute) exacerbation: Secondary | ICD-10-CM | POA: Insufficient documentation

## 2017-07-26 DIAGNOSIS — Z87891 Personal history of nicotine dependence: Secondary | ICD-10-CM | POA: Insufficient documentation

## 2017-07-26 DIAGNOSIS — Z79899 Other long term (current) drug therapy: Secondary | ICD-10-CM | POA: Insufficient documentation

## 2017-07-26 DIAGNOSIS — N182 Chronic kidney disease, stage 2 (mild): Secondary | ICD-10-CM | POA: Insufficient documentation

## 2017-07-26 DIAGNOSIS — R0602 Shortness of breath: Secondary | ICD-10-CM | POA: Diagnosis present

## 2017-07-26 LAB — CBC
HEMATOCRIT: 40.2 % (ref 39.0–52.0)
HEMOGLOBIN: 13.3 g/dL (ref 13.0–17.0)
MCH: 28.1 pg (ref 26.0–34.0)
MCHC: 33.1 g/dL (ref 30.0–36.0)
MCV: 84.8 fL (ref 78.0–100.0)
Platelets: 290 10*3/uL (ref 150–400)
RBC: 4.74 MIL/uL (ref 4.22–5.81)
RDW: 15.1 % (ref 11.5–15.5)
WBC: 7.9 10*3/uL (ref 4.0–10.5)

## 2017-07-26 LAB — BASIC METABOLIC PANEL
ANION GAP: 11 (ref 5–15)
BUN: 17 mg/dL (ref 6–20)
CALCIUM: 9.2 mg/dL (ref 8.9–10.3)
CO2: 21 mmol/L — AB (ref 22–32)
Chloride: 104 mmol/L (ref 101–111)
Creatinine, Ser: 1.92 mg/dL — ABNORMAL HIGH (ref 0.61–1.24)
GFR calc Af Amer: 41 mL/min — ABNORMAL LOW (ref >60)
GFR calc non Af Amer: 35 mL/min — ABNORMAL LOW (ref >60)
GLUCOSE: 114 mg/dL — AB (ref 65–99)
Potassium: 4.2 mmol/L (ref 3.5–5.1)
Sodium: 136 mmol/L (ref 135–145)

## 2017-07-26 LAB — I-STAT TROPONIN, ED: TROPONIN I, POC: 0.02 ng/mL (ref 0.00–0.08)

## 2017-07-26 MED ORDER — ALBUTEROL SULFATE (2.5 MG/3ML) 0.083% IN NEBU
5.0000 mg | INHALATION_SOLUTION | Freq: Once | RESPIRATORY_TRACT | Status: AC
  Administered 2017-07-26: 5 mg via RESPIRATORY_TRACT
  Filled 2017-07-26: qty 6

## 2017-07-26 MED ORDER — METHYLPREDNISOLONE SODIUM SUCC 125 MG IJ SOLR
125.0000 mg | Freq: Once | INTRAMUSCULAR | Status: AC
  Administered 2017-07-26: 125 mg via INTRAVENOUS
  Filled 2017-07-26: qty 2

## 2017-07-26 MED ORDER — PREDNISONE 50 MG PO TABS: ORAL_TABLET | ORAL | 0 refills | Status: DC

## 2017-07-26 MED ORDER — DOXYCYCLINE HYCLATE 100 MG PO TABS
100.0000 mg | ORAL_TABLET | Freq: Once | ORAL | Status: AC
  Administered 2017-07-26: 100 mg via ORAL
  Filled 2017-07-26: qty 1

## 2017-07-26 MED ORDER — MORPHINE SULFATE (PF) 4 MG/ML IV SOLN
4.0000 mg | Freq: Once | INTRAVENOUS | Status: AC
Start: 2017-07-26 — End: 2017-07-26
  Administered 2017-07-26: 4 mg via INTRAVENOUS
  Filled 2017-07-26: qty 1

## 2017-07-26 MED ORDER — IPRATROPIUM-ALBUTEROL 0.5-2.5 (3) MG/3ML IN SOLN
3.0000 mL | Freq: Once | RESPIRATORY_TRACT | Status: AC
  Administered 2017-07-26: 3 mL via RESPIRATORY_TRACT
  Filled 2017-07-26: qty 3

## 2017-07-26 MED ORDER — SODIUM CHLORIDE 0.9 % IV SOLN
INTRAVENOUS | Status: DC
  Administered 2017-07-26: 09:00:00 via INTRAVENOUS

## 2017-07-26 MED ORDER — ALBUTEROL SULFATE (2.5 MG/3ML) 0.083% IN NEBU: 5.0000 mg | INHALATION_SOLUTION | Freq: Once | RESPIRATORY_TRACT | Status: DC

## 2017-07-26 MED ORDER — ONDANSETRON HCL 4 MG/2ML IJ SOLN
4.0000 mg | Freq: Once | INTRAMUSCULAR | Status: AC
  Administered 2017-07-26: 4 mg via INTRAVENOUS
  Filled 2017-07-26: qty 2

## 2017-07-26 MED ORDER — IPRATROPIUM BROMIDE 0.02 % IN SOLN: 0.5000 mg | Freq: Four times a day (QID) | RESPIRATORY_TRACT | 12 refills | Status: DC

## 2017-07-26 MED ORDER — DOXYCYCLINE HYCLATE 100 MG PO CAPS: 100.0000 mg | ORAL_CAPSULE | Freq: Two times a day (BID) | ORAL | 0 refills | Status: DC

## 2017-07-26 NOTE — ED Provider Notes (Signed)
Medical screening examination/treatment/procedure(s) were conducted as a shared visit with non-physician practitioner(s) and myself.  I personally evaluated the patient during the encounter.   EKG Interpretation  Date/Time:  Sunday July 26 2017 02:51:08 EST Ventricular Rate:  87 PR Interval:  142 QRS Duration: 90 QT Interval:  398 QTC Calculation: 478 R Axis:   75 Text Interpretation:  Normal sinus rhythm Left ventricular hypertrophy Abnormal ECG no STEMI. no sig change as compared to previous ekg. Confirmed by Charlesetta Shanks [phone]) on 07/26/2017 7:21:01 AM     Reports he thinks he got a cold and he started coughing a lot last night.  He reports he could not stop coughing and then he started vomiting.  He reports it just kept getting worse until he knew he was heading towards a bad attack.  He came to the emergency department for treatment.  He is already had albuterol and Solu-Medrol.  He reports he is feeling a lot better now.  He reports he feels like he still needs another treatment but he feels about ready to go home.  She is alert and appropriate.  No respiratory distress at rest.  His vital signs are stable.  Patient does have diffuse wheeze throughout all lung fields.  Adequate airflow to the bases.  Heart regular.  No peripheral edema.   I agree with plan and management.   Charlesetta Shanks, MD 07/26/17 1030

## 2017-07-26 NOTE — Discharge Instructions (Signed)
Please follow with your primary care doctor in the next 2 days for a check-up. They must obtain records for further management.  ° °Do not hesitate to return to the Emergency Department for any new, worsening or concerning symptoms.  ° °

## 2017-07-26 NOTE — ED Provider Notes (Signed)
[facility] EMERGENCY DEPARTMENT Provider Note   CSN: [id number] Arrival date & time: 07/26/17  0246     History   Chief Complaint Chief Complaint  Patient presents with  . Shortness of Breath   HPI   Blood pressure (!) 152/84, pulse 88, temperature 99.2 F (37.3 C), temperature source Oral, resp. rate 20, height 5\' 11"  (1.803 m), weight 69.4 kg (153 lb), SpO2 96 %.  Joe Garcia is a 66 y.o. male with past medical history significant for emphysema, CHF, solitary kidney and alcoholism complaining of shortness of breath over the last day with productive cough and posttussive emesis.  Patient denies fevers, chills, chest pain, increasing peripheral edema.  Patient received 1  DuoNeb prior to my assessment he states he feels much improved.   Past Medical History:  Diagnosis Date  . Alcoholism ([place])   . Arthritis    hips, L shoulder  . Asthma   . CHF (congestive heart failure) ([place])   . Depression   . Family history of anesthesia complication   . Hyperlipidemia   . Hypertension     Patient Active Problem List   Diagnosis Date Noted  . Malnutrition of moderate degree 07/16/2017  . Asthma exacerbation 07/14/2017  . Acute respiratory distress 07/14/2017  . 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 Acute on chronic combined systolic and diastolic CHF (congestive heart failure) ([place]) 07/14/2017  . Solitary kidney, congenital 06/23/2017  . AKI (acute kidney injury) ([place]) 06/17/2017  . Epigastric pain 06/17/2017  . Elevated troponin 06/17/2017  . Nonischemic cardiomyopathy ([place])   . Multifocal PVCs   . Chronic combined systolic (congestive) and diastolic (congestive) heart failure ([place]) 06/11/2017  . Influenza A 07/17/2013  . Influenza 07/16/2013  . Fever 07/16/2013  . Tachycardia 07/16/2013  . Alcohol abuse 09/25/2012  . Alcohol dependence ([place]) 09/22/2012  . Essential hypertension 03/22/2007  . DEGENERATIVE DISC DISEASE 03/22/2007  .  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 RIGHT/LEFT HEART CATH AND CORONARY ANGIOGRAPHY N/A 06/12/2017   Procedure: RIGHT/LEFT HEART CATH AND CORONARY ANGIOGRAPHY;  Surgeon: Jolaine Artist, MD;  Location: [facility];  Service: Cardiovascular;  Laterality: N/A;       Home Medications    Prior to Admission medications   Medication Sig Start Date End Date Taking? Authorizing Provider  acetaminophen (TYLENOL) 500 MG tablet Take 500-1,000 mg by mouth every 6 (six) hours as needed (for pain or headaches).   Yes [provider]  albuterol (PROVENTIL HFA;VENTOLIN HFA) 108 (90 BASE) MCG/ACT inhaler Inhale 1-2 puffs into the lungs every 6 (six) hours as needed for wheezing or shortness of breath. 07/12/15  Yes Barrett, Lahoma Crocker, PA-C  albuterol (PROVENTIL) (2.5 MG/3ML) 0.083% nebulizer solution Take 3 mLs (2.5 mg total) by nebulization every 6 (six) hours as needed for wheezing or shortness of breath. 10/16/15  Yes Kirichenko, Tatyana, PA-C  amiodarone (PACERONE) 200 MG tablet Take 1 tablet (200 mg total) by mouth daily. 07/13/17  Yes Clegg, Amy D, NP  carvedilol (COREG) 3.125 MG tablet Take 1 tablet (3.125 mg total) by mouth 2 (two) times daily with a meal. 07/24/17  Yes Clegg, Amy D, NP  furosemide (LASIX) 20 MG tablet Take 1 tablet (20 mg total) by mouth daily. 07/16/17 07/16/18 Yes Doreatha Lew, MD  isosorbide-hydrALAZINE (BIDIL) 20-37.5 MG tablet Take 1 tablet by mouth 3 (three) times daily. 07/16/17  Yes Doreatha Lew, MD  polyethylene glycol powder (GLYCOLAX/MIRALAX) powder Take 17 g by mouth daily as needed for mild constipation.   Yes [provider]  doxycycline (VIBRAMYCIN) 100 MG capsule Take 1 capsule (100 mg total) by mouth 2 (two) times daily. 07/26/17   Eurydice Calixto, Elmyra Ricks, PA-C  ipratropium (ATROVENT) 0.02 % nebulizer solution Take 2.5 mLs (0.5 mg total) by nebulization 4 (four) times daily. 07/26/17    Vuong Musa, Elmyra Ricks, PA-C  predniSONE (DELTASONE) 50 MG tablet Take 1 tablet daily with breakfast 07/26/17   Rosalia Mcavoy, Elmyra Ricks, PA-C    Family History Family History  Problem Relation Age of Onset  . Heart disease Father   . Heart disease Daughter        "fluid around heart"     Social History Social History   Tobacco Use  . Smoking status: Former Smoker    Packs/day: 0.10    Years: 45.00    Pack years: 4.50    Last attempt to quit: 2012    Years since quitting: 7.0  . Smokeless tobacco: Never Used  Substance Use Topics  . Alcohol use: No    Frequency: Never    Comment: Pt states no etoh since April 2014  . Drug use: No    Comment: Prior use of crack cocaine, quit 2012     Allergies   Penicillins   Review of Systems Review of Systems  A complete review of systems was obtained and all systems are negative except as noted in the HPI and PMH.   Physical Exam Updated Vital Signs BP (!) 155/76   Pulse 70   Temp 99.2 F (37.3 C) (Oral)   Resp 17   Ht 5\' 11"  (1.803 m)   Wt 69.4 kg (153 lb)   SpO2 95%   BMI 21.34 kg/m   Physical Exam  Constitutional: He is oriented to person, place, and time. He appears well-developed and well-nourished. No distress.  HENT:  Head: Normocephalic and atraumatic.  Mouth/Throat: Oropharynx is clear and moist.  Eyes: Conjunctivae and EOM are normal. Pupils are equal, round, and reactive to light.  Neck: Normal range of motion.  Cardiovascular: Normal rate, regular rhythm and intact distal pulses.  Pulmonary/Chest:  Mild tachypnea, mild supraclavicular contractions, patient speaking in 4-5 word sentences.  Adequate air movement with diffuse mild expiratory wheezing.  Abdominal: Soft. There is no tenderness.  Musculoskeletal: Normal range of motion. He exhibits no edema.  Neurological: He is alert and oriented to person, place, and time.  Skin: He is not diaphoretic.  Psychiatric: He has a normal mood and affect.  Nursing note and  vitals reviewed.    ED Treatments / Results  Labs (all labs ordered are listed, but only abnormal results are displayed) Labs Reviewed  BASIC METABOLIC PANEL - Abnormal; Notable for the following components:      Result Value   CO2 21 (*)    Glucose, Bld 114 (*)    Creatinine, Ser 1.92 (*)    GFR calc non Af Amer 35 (*)    GFR calc Af Amer 41 (*)    All other components within normal limits  CBC  I-STAT TROPONIN, ED    EKG  EKG Interpretation  Date/Time:  Sunday July 26 2017 02:51:08 EST Ventricular Rate:  87 PR Interval:  142 QRS Duration: 90 QT Interval:  398 QTC Calculation: 478 R Axis:   75 Text Interpretation:  Normal sinus rhythm Left ventricular hypertrophy Abnormal ECG no STEMI. no sig change as compared to previous ekg. Confirmed  by Charlesetta Shanks [phone]) on 07/26/2017 7:21:01 AM       Radiology Dg Chest 2 View  Result Date: 07/26/2017 CLINICAL DATA:  66 year old male with shortness of breath. EXAM: CHEST  2 VIEW COMPARISON:  Chest radiograph dated 07/14/2017 FINDINGS: There is emphysematous changes of the lungs. No focal consolidation, pleural effusion, or pneumothorax. The cardiac silhouette is within normal limits. No acute osseous pathology. IMPRESSION: 1. No acute cardiopulmonary process. 2. Emphysema. Electronically Signed   By: Anner Crete M.D.   On: 07/26/2017 03:46    Procedures Procedures (including critical care time)  Medications Ordered in ED Medications  0.9 %  sodium chloride infusion ( Intravenous New Bag/Given 07/26/17 0830)  albuterol (PROVENTIL) (2.5 MG/3ML) 0.083% nebulizer solution 5 mg (5 mg Nebulization Given 07/26/17 0251)  methylPREDNISolone sodium succinate (SOLU-MEDROL) 125 mg/2 mL injection 125 mg (125 mg Intravenous Given 07/26/17 0824)  albuterol (PROVENTIL) (2.5 MG/3ML) 0.083% nebulizer solution 5 mg (5 mg Nebulization Given 07/26/17 0814)  morphine 4 MG/ML injection 4 mg (4 mg Intravenous Given 07/26/17 0825)    ondansetron (ZOFRAN) injection 4 mg (4 mg Intravenous Given 07/26/17 0824)  ipratropium-albuterol (DUONEB) 0.5-2.5 (3) MG/3ML nebulizer solution 3 mL (3 mLs Nebulization Given 07/26/17 1046)  doxycycline (VIBRA-TABS) tablet 100 mg (100 mg Oral Given 07/26/17 1046)     Initial Impression / Assessment and Plan / ED Course  I have reviewed the triage vital signs and the nursing notes.  Pertinent labs & imaging results that were available during my care of the patient were reviewed by me and considered in my medical decision making (see chart for details).     Vitals:   07/26/17 0900 07/26/17 0930 07/26/17 1000 07/26/17 1030  BP: 132/68 126/67 128/67 (!) 155/76  Pulse: 65 63 65 70  Resp: 19 18 20 17   Temp:      TempSrc:      SpO2: 91% 93% 95% 95%  Weight:      Height:        Medications  0.9 %  sodium chloride infusion ( Intravenous New Bag/Given 07/26/17 0830)  albuterol (PROVENTIL) (2.5 MG/3ML) 0.083% nebulizer solution 5 mg (5 mg Nebulization Given 07/26/17 0251)  methylPREDNISolone sodium succinate (SOLU-MEDROL) 125 mg/2 mL injection 125 mg (125 mg Intravenous Given 07/26/17 0824)  albuterol (PROVENTIL) (2.5 MG/3ML) 0.083% nebulizer solution 5 mg (5 mg Nebulization Given 07/26/17 0814)  morphine 4 MG/ML injection 4 mg (4 mg Intravenous Given 07/26/17 0825)  ondansetron (ZOFRAN) injection 4 mg (4 mg Intravenous Given 07/26/17 0824)  ipratropium-albuterol (DUONEB) 0.5-2.5 (3) MG/3ML nebulizer solution 3 mL (3 mLs Nebulization Given 07/26/17 1046)  doxycycline (VIBRA-TABS) tablet 100 mg (100 mg Oral Given 07/26/17 1046)    Joe Garcia is 66 y.o. male presenting with shortness of breath, fits of coughing onset yesterday, patient afebrile, nontoxic-appearing, lung sounds with diffuse wheezes.  No crackles, increasing peripheral edema, orthopnea or PND's to suggest a CHF exacerbation.  Chest x-ray is without infiltrate.  Patient feels significantly improved after nebulizer  solutions and Solu-Medrol in the ED, will start on doxycycline to cover for COPD exacerbation and a possible sinusitis, prednisone burst given and ipratropium to add to home nebulizer.  This is a shared visit with the attending physician who personally evaluated the patient and agrees with the care plan.   Evaluation does not show pathology that would require ongoing emergent intervention or inpatient treatment. Pt is hemodynamically stable and mentating appropriately. Discussed findings and plan with patient/guardian, who agrees with care  plan. All questions answered. Return precautions discussed and outpatient follow up given.    Final Clinical Impressions(s) / ED Diagnoses   Final diagnoses:  COPD exacerbation [facility])    ED Discharge Orders        Ordered    doxycycline (VIBRAMYCIN) 100 MG capsule  2 times daily     07/26/17 1125    predniSONE (DELTASONE) 50 MG tablet     07/26/17 1125    ipratropium (ATROVENT) 0.02 % nebulizer solution  4 times daily     07/26/17 [place], Charna Elizabeth 07/26/17 1127    Charlesetta Shanks, MD 07/31/17 1745

## 2017-07-26 NOTE — ED Triage Notes (Signed)
Patient reports waking up feeling more sob than usual.  Also endorses coughing up white sputum all day.  Noted to be labored in triage.  Lungs diminished with wheezing.

## 2017-07-30 NOTE — Progress Notes (Signed)
Advanced Heart Failure Clinic Note   Primary Cardiologist: Dr. Gwenlyn Found HF: Dr. [name]  PCP:   HPI: Joe Garcia is a 67 y.o. 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 Former crack cocaine abuse.   Admitted 11/14 - 06/15/17 with CP and DOE. Received steroids and nebs. BNP minimally elevated on arrival. Troponin negative.  R/LHC below with normal cors and compensated hemodynamics. 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   Re-admitted 11/21-11/23/18 with cramping abdominal pain and GI symptoms. CT abdomen with no negative findings, treated as gastroenteritis. AKI noted with rise of creatinine from 1.0 -> 2.0 on admit. Improved to 1.7 at discharge. Pt states he never picked up [facility] after he left the hospital initially.   Today he returns for HF follow up. Last visit entresto restarted  However he did not start due to solitary kidney.  He also had medication errors discovered so he was referred to HF Paramedicine. Overall feeling fine. Denies SOB/PND/Orthopnea. Currently being treated for COPD exacerbation. Appetite ok. No fever or chills.  Taking all medications   ECHO 05/06/2017 EF 30-35% Grade IDD  Review of systems complete and found to be negative unless listed in HPI.    Past Medical History:  Diagnosis Date  . Alcoholism ([place])   . Arthritis    hips, L shoulder  . Asthma   . CHF (congestive heart failure) ([place])   . Depression   . Family history of anesthesia complication   . Hyperlipidemia   . Hypertension    Current Outpatient Medications  Medication Sig Dispense Refill  . albuterol (PROVENTIL HFA;VENTOLIN HFA) 108 (90 BASE) MCG/ACT inhaler Inhale 1-2 puffs into the lungs every 6 (six) hours as needed for wheezing or shortness of breath. 1 Inhaler 0  . albuterol (PROVENTIL) (2.5 MG/3ML) 0.083% nebulizer solution Take 3 mLs (2.5 mg total) by nebulization every 6 (six) hours as needed for wheezing or shortness of  breath. 75 mL 12  . amiodarone (PACERONE) 200 MG tablet Take 1 tablet (200 mg total) by mouth daily. 90 tablet 3  . carvedilol (COREG) 3.125 MG tablet Take 1 tablet (3.125 mg total) by mouth 2 (two) times daily with a meal. 60 tablet 5  . doxycycline (VIBRAMYCIN) 100 MG capsule Take 1 capsule (100 mg total) by mouth 2 (two) times daily. 20 capsule 0  . furosemide (LASIX) 20 MG tablet Take 1 tablet (20 mg total) by mouth daily. 30 tablet 11  . ipratropium (ATROVENT) 0.02 % nebulizer solution Take 2.5 mLs (0.5 mg total) by nebulization 4 (four) times daily. 75 mL 12  . isosorbide-hydrALAZINE (BIDIL) 20-37.5 MG tablet Take 1 tablet by mouth 3 (three) times daily. 90 tablet 0  . polyethylene glycol powder (GLYCOLAX/MIRALAX) powder Take 17 g by mouth daily as needed for mild constipation.    . predniSONE (DELTASONE) 50 MG tablet Take 1 tablet daily with breakfast 5 tablet 0  . acetaminophen (TYLENOL) 500 MG tablet Take 500-1,000 mg by mouth every 6 (six) hours as needed (for pain or headaches).     No current facility-administered medications for this encounter.    Allergies  Allergen Reactions  . Penicillins Other (See Comments)    "Stomach pains" Has patient had a PCN reaction causing immediate rash, facial/tongue/throat swelling, SOB or lightheadedness with hypotension: Unk Has patient had a PCN reaction causing severe rash involving mucus membranes or skin necrosis: Unk Has patient had a PCN reaction that required hospitalization: Unk Has  patient had a PCN reaction occurring within the last 10 years: No If all of the above answers are "NO", then may proceed with Cephalosporin use.    Social History   Socioeconomic History  . Marital status: Single    Spouse name: Not on file  . Number of children: Not on file  . Years of education: Not on file  . Highest education level: Not on file  Social Needs  . Financial resource strain: Not on file  . Food insecurity - worry: Not on file  .  Food insecurity - inability: Not on file  . Transportation needs - medical: Not on file  . Transportation needs - non-medical: Not on file  Occupational History  . Occupation: "it don't work for me", on disability  Tobacco Use  . Smoking status: Former Smoker    Packs/day: 0.10    Years: 45.00    Pack years: 4.50    Last attempt to quit: 2012    Years since quitting: 7.0  . Smokeless tobacco: Never Used  Substance and Sexual Activity  . Alcohol use: No    Frequency: Never    Comment: Pt states no etoh since April 2014  . Drug use: No    Comment: Prior use of crack cocaine, quit 2012  . Sexual activity: No  Other Topics Concern  . Not on file  Social History Narrative  . Not on file    Family History  Problem Relation Age of Onset  . Heart disease Father   . Heart disease Daughter        "fluid around heart"    Vitals:   07/31/17 1155  BP: (!) 236/104  Pulse: 62  SpO2: 100%  Weight: 155 lb 3.2 oz (70.4 kg)   Wt Readings from Last 3 Encounters:  07/31/17 155 lb 3.2 oz (70.4 kg)  07/26/17 153 lb (69.4 kg)  07/24/17 153 lb 9.6 oz (69.7 kg)   PHYSICAL EXAM: General:  Thin. No resp difficulty HEENT: normal Neck: supple. no JVD. Carotids 2+ bilat; no bruits. No lymphadenopathy or thryomegaly appreciated. Cor: PMI nondisplaced. Regular rate & rhythm. No rubs, gallops or murmurs. Lungs: clear Abdomen: soft, nontender, nondistended. No hepatosplenomegaly. No bruits or masses. Good bowel sounds. Extremities: no cyanosis, clubbing, rash, edema Neuro: alert & orientedx3, cranial nerves grossly intact. moves all 4 extremities w/o difficulty. Affect pleasant       ASSESSMENT & PLAN: 1. Chronic Systolic Heart Failure. ECHO 05/06/2017 EF 30-35%. NICM. 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 Well compensated hemodynamics.   Suspect dyspnea related to suspected COPD. He does not appear volume overloaded.  - Volume status stable. Does not need lasix.    - Continue coreg 3.125 mg BID.  - Increase bidil to 2 tab twice a day.  -he refuses entresto due to solitary kidney. esto 24/26 mg BID.  2. PVCs Contineu Cut back amio to 200 mg daily.  3. . HTN - Uncontrolled. Goal SBP < 130. Increase bidil as noted above.  4. Solitary Kidney, Congenital - - Follow renal function carefully with med adjustments.  BMET in am.  5. Illiteracy Can not read pill bottles.  Follow up in 2 weeks with pharmacy and 4 weeks with APP.    Darrick Grinder, NP 07/31/17

## 2017-07-31 ENCOUNTER — Encounter (HOSPITAL_COMMUNITY): Payer: Self-pay

## 2017-07-31 ENCOUNTER — Other Ambulatory Visit (HOSPITAL_COMMUNITY): Payer: Self-pay

## 2017-07-31 ENCOUNTER — Ambulatory Visit (HOSPITAL_COMMUNITY)
Admission: RE | Admit: 2017-07-31 | Discharge: 2017-07-31 | Disposition: A | Discharge status: Home / Self Care | Payer: Medicaid Other | Source: Ambulatory Visit | Attending: Internal Medicine | Admitting: Internal Medicine

## 2017-07-31 VITALS — BP 190/90 | HR 62 | Wt 155.2 lb

## 2017-07-31 DIAGNOSIS — I110 Hypertensive heart disease with heart failure: Secondary | ICD-10-CM | POA: Insufficient documentation

## 2017-07-31 DIAGNOSIS — I5022 Chronic systolic (congestive) heart failure: Secondary | ICD-10-CM | POA: Insufficient documentation

## 2017-07-31 DIAGNOSIS — Q600 Renal agenesis, unilateral: Secondary | ICD-10-CM | POA: Insufficient documentation

## 2017-07-31 DIAGNOSIS — Z87891 Personal history of nicotine dependence: Secondary | ICD-10-CM | POA: Diagnosis not present

## 2017-07-31 DIAGNOSIS — I493 Ventricular premature depolarization: Secondary | ICD-10-CM

## 2017-07-31 DIAGNOSIS — I5042 Chronic combined systolic (congestive) and diastolic (congestive) heart failure: Secondary | ICD-10-CM

## 2017-07-31 DIAGNOSIS — Z550 Illiteracy and low-level literacy: Secondary | ICD-10-CM | POA: Diagnosis not present

## 2017-07-31 DIAGNOSIS — Z79899 Other long term (current) drug therapy: Secondary | ICD-10-CM | POA: Diagnosis not present

## 2017-07-31 DIAGNOSIS — I10 Essential (primary) hypertension: Secondary | ICD-10-CM

## 2017-07-31 MED ORDER — ISOSORB DINITRATE-HYDRALAZINE 20-37.5 MG PO TABS: 2.0000 | ORAL_TABLET | Freq: Three times a day (TID) | ORAL | 1 refills | Status: DC

## 2017-07-31 NOTE — Progress Notes (Signed)
Paramedicine Encounter   Patient ID: Joe Garcia , male,   DOB: [birth year],66 y.o.,  MRN: [id number]   Met patient in clinic today with provider Amy.   Pt is hypertensive this am. Pt stated that he took his medications prior to this appointment.  He also has a headache, back of his head.  He denies dizziness, nausea, chest pain and sob.  Pt is currently taking doxycycline and prednisone due to respiratory infection.  Pt is scheduled to come back in two weeks to talk to [place].  I need to f/u with pt two times next week due to his bp.  Pt has been approved for food stamps and he was given a list of foods to stay away from. Pt stated that the pharmacy was charging him $13 for the albuterol inhaler which he cant afford.  Doroteo Bradford f/u with the pharmacy whom stated that advised her that there was a misunderstanding.  Pt was called and advised of the same.   **rx change: Increase bidil to two pills three times a day Time spent with patient 30 mins  Dee Bambie Pizzolato, EMT-Paramedic 07/31/2017  **rx called in: bidil furosemide  ACTION: Next visit planned for next tues

## 2017-07-31 NOTE — Patient Instructions (Signed)
INCREASE Bidil to 2 tab three times per day  Your physician recommends that you schedule a follow-up appointment in: 2 weeks with [facility] PharmD Doroteo Bradford  Your physician recommends that you schedule a follow-up appointment in: 4 weeks with Amy Clegg,NP   Do the following things EVERYDAY: 1) Weigh yourself in the morning before breakfast. Write it down and keep it in a log. 2) Take your medicines as prescribed 3) Eat low salt foods-Limit salt (sodium) to 2000 mg per day.  4) Stay as active as you can everyday 5) Limit all fluids for the day to less than 2 liters

## 2017-08-04 ENCOUNTER — Other Ambulatory Visit (HOSPITAL_COMMUNITY): Payer: Self-pay

## 2017-08-04 NOTE — Progress Notes (Signed)
Paramedicine Encounter    Patient ID: Joe Garcia, male    DOB: 1951/06/12, 67 y.o.   MRN: [id number]    Patient Care Team: Medicine, [facility] as PCP - General  Patient Active Problem List   Diagnosis Date Noted  . Malnutrition of moderate degree 07/16/2017  . Asthma exacerbation 07/14/2017  . Acute respiratory distress 07/14/2017  . 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 Acute on chronic combined systolic and diastolic CHF (congestive heart failure) ([place]) 07/14/2017  . Solitary kidney, congenital 06/23/2017  . AKI (acute kidney injury) ([place]) 06/17/2017  . Epigastric pain 06/17/2017  . Elevated troponin 06/17/2017  . Nonischemic cardiomyopathy ([place])   . Multifocal PVCs   . Chronic combined systolic (congestive) and diastolic (congestive) heart failure ([place]) 06/11/2017  . Influenza A 07/17/2013  . Influenza 07/16/2013  . Fever 07/16/2013  . Tachycardia 07/16/2013  . Alcohol abuse 09/25/2012  . Alcohol dependence ([place]) 09/22/2012  . Essential hypertension 03/22/2007  . DEGENERATIVE DISC DISEASE 03/22/2007  . AVASCULAR NECROSIS 03/22/2007    Current Outpatient Medications:  .  acetaminophen (TYLENOL) 500 MG tablet, Take 500-1,000 mg by mouth every 6 (six) hours as needed (for pain or headaches)., Disp: , Rfl:  .  albuterol (PROVENTIL HFA;VENTOLIN HFA) 108 (90 BASE) MCG/ACT inhaler, Inhale 1-2 puffs into the lungs every 6 (six) hours as needed for wheezing or shortness of breath., Disp: 1 Inhaler, Rfl: 0 .  albuterol (PROVENTIL) (2.5 MG/3ML) 0.083% nebulizer solution, Take 3 mLs (2.5 mg total) by nebulization every 6 (six) hours as needed for wheezing or shortness of breath., Disp: 75 mL, Rfl: 12 .  amiodarone (PACERONE) 200 MG tablet, Take 1 tablet (200 mg total) by mouth daily., Disp: 90 tablet, Rfl: 3 .  carvedilol (COREG) 3.125 MG tablet, Take 1 tablet (3.125 mg total) by mouth 2 (two) times daily with a meal., Disp: 60 tablet, Rfl: 5 .   doxycycline (VIBRAMYCIN) 100 MG capsule, Take 1 capsule (100 mg total) by mouth 2 (two) times daily., Disp: 20 capsule, Rfl: 0 .  furosemide (LASIX) 20 MG tablet, Take 1 tablet (20 mg total) by mouth daily., Disp: 30 tablet, Rfl: 11 .  ipratropium (ATROVENT) 0.02 % nebulizer solution, Take 2.5 mLs (0.5 mg total) by nebulization 4 (four) times daily., Disp: 75 mL, Rfl: 12 .  isosorbide-hydrALAZINE (BIDIL) 20-37.5 MG tablet, Take 2 tablets by mouth 3 (three) times daily., Disp: 180 tablet, Rfl: 1 .  polyethylene glycol powder (GLYCOLAX/MIRALAX) powder, Take 17 g by mouth daily as needed for mild constipation., Disp: , Rfl:  .  predniSONE (DELTASONE) 50 MG tablet, Take 1 tablet daily with breakfast, Disp: 5 tablet, Rfl: 0 Allergies  Allergen Reactions  . Penicillins Other (See Comments)    "Stomach pains" Has patient had a PCN reaction causing immediate rash, facial/tongue/throat swelling, SOB or lightheadedness with hypotension: Unk Has patient had a PCN reaction causing severe rash involving mucus membranes or skin necrosis: Unk Has patient had a PCN reaction that required hospitalization: Unk Has patient had a PCN reaction occurring within the last 10 years: No If all of the above answers are "NO", then may proceed with Cephalosporin use.      Social History   Socioeconomic History  . Marital status: Single    Spouse name: Not on file  . Number of children: Not on file  . Years of education: Not on file  . Highest education level: Not on file  Social Needs  .  Financial resource strain: Not on file  . Food insecurity - worry: Not on file  . Food insecurity - inability: Not on file  . Transportation needs - medical: Not on file  . Transportation needs - non-medical: Not on file  Occupational History  . Occupation: "it don't work for me", on disability  Tobacco Use  . Smoking status: Former Smoker    Packs/day: 0.10    Years: 45.00    Pack years: 4.50    Last attempt to quit:  2012    Years since quitting: 7.0  . Smokeless tobacco: Never Used  Substance and Sexual Activity  . Alcohol use: No    Frequency: Never    Comment: Pt states no etoh since April 2014  . Drug use: No    Comment: Prior use of crack cocaine, quit 2012  . Sexual activity: No  Other Topics Concern  . Not on file  Social History Narrative  . Not on file    Physical Exam      Future Appointments  Date Time Provider [place]  08/13/2017 11:00 AM [facility] PHARMACY [facility] None  08/28/2017 11:30 AM [facility] PA/NP [facility] None    ATF pt CAO x4 sitting in the living room with no complaints.  CHP visit due to pt being hypertensive due to the visit.  Pt stated he feels great and he had a great weekend.  Pt vitals noted. He denies sob, chest pain and dizziness.   BP 138/70 (BP Location: Left Arm, Patient Position: Sitting, Cuff Size: Normal)   Pulse 76   Resp 16   SpO2 98%     Jovanna Hodges, EMT Paramedic 08/04/2017    ACTION: Home visit completed Next visit planned for thrusday

## 2017-08-07 ENCOUNTER — Other Ambulatory Visit (HOSPITAL_COMMUNITY): Payer: Self-pay | Admitting: Pharmacist

## 2017-08-07 ENCOUNTER — Other Ambulatory Visit (HOSPITAL_COMMUNITY): Payer: Self-pay

## 2017-08-07 ENCOUNTER — Encounter (HOSPITAL_COMMUNITY): Payer: Self-pay

## 2017-08-07 MED ORDER — ISOSORB DINITRATE-HYDRALAZINE 20-37.5 MG PO TABS: 2.0000 | ORAL_TABLET | Freq: Three times a day (TID) | ORAL | 3 refills | Status: DC

## 2017-08-07 NOTE — Progress Notes (Signed)
Paramedicine Encounter    Patient ID: Joe Garcia, male    DOB: 1950-08-27, 67 y.o.   MRN: [id number]    Patient Care Team: Medicine, [facility] as PCP - General  Patient Active Problem List   Diagnosis Date Noted  . Malnutrition of moderate degree 07/16/2017  . Asthma exacerbation 07/14/2017  . Acute respiratory distress 07/14/2017  . 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 Acute on chronic combined systolic and diastolic CHF (congestive heart failure) ([place]) 07/14/2017  . Solitary kidney, congenital 06/23/2017  . AKI (acute kidney injury) ([place]) 06/17/2017  . Epigastric pain 06/17/2017  . Elevated troponin 06/17/2017  . Nonischemic cardiomyopathy ([place])   . Multifocal PVCs   . Chronic combined systolic (congestive) and diastolic (congestive) heart failure ([place]) 06/11/2017  . Influenza A 07/17/2013  . Influenza 07/16/2013  . Fever 07/16/2013  . Tachycardia 07/16/2013  . Alcohol abuse 09/25/2012  . Alcohol dependence ([place]) 09/22/2012  . Essential hypertension 03/22/2007  . DEGENERATIVE DISC DISEASE 03/22/2007  . AVASCULAR NECROSIS 03/22/2007    Current Outpatient Medications:  .  amiodarone (PACERONE) 200 MG tablet, Take 1 tablet (200 mg total) by mouth daily., Disp: 90 tablet, Rfl: 3 .  carvedilol (COREG) 3.125 MG tablet, Take 1 tablet (3.125 mg total) by mouth 2 (two) times daily with a meal., Disp: 60 tablet, Rfl: 5 .  furosemide (LASIX) 20 MG tablet, Take 1 tablet (20 mg total) by mouth daily., Disp: 30 tablet, Rfl: 11 .  acetaminophen (TYLENOL) 500 MG tablet, Take 500-1,000 mg by mouth every 6 (six) hours as needed (for pain or headaches)., Disp: , Rfl:  .  albuterol (PROVENTIL HFA;VENTOLIN HFA) 108 (90 BASE) MCG/ACT inhaler, Inhale 1-2 puffs into the lungs every 6 (six) hours as needed for wheezing or shortness of breath., Disp: 1 Inhaler, Rfl: 0 .  albuterol (PROVENTIL) (2.5 MG/3ML) 0.083% nebulizer solution, Take 3 mLs (2.5 mg total) by  nebulization every 6 (six) hours as needed for wheezing or shortness of breath., Disp: 75 mL, Rfl: 12 .  doxycycline (VIBRAMYCIN) 100 MG capsule, Take 1 capsule (100 mg total) by mouth 2 (two) times daily., Disp: 20 capsule, Rfl: 0 .  ipratropium (ATROVENT) 0.02 % nebulizer solution, Take 2.5 mLs (0.5 mg total) by nebulization 4 (four) times daily., Disp: 75 mL, Rfl: 12 .  isosorbide-hydrALAZINE (BIDIL) 20-37.5 MG tablet, Take 2 tablets by mouth 3 (three) times daily., Disp: 180 tablet, Rfl: 3 .  polyethylene glycol powder (GLYCOLAX/MIRALAX) powder, Take 17 g by mouth daily as needed for mild constipation., Disp: , Rfl:  .  predniSONE (DELTASONE) 50 MG tablet, Take 1 tablet daily with breakfast, Disp: 5 tablet, Rfl: 0 Allergies  Allergen Reactions  . Penicillins Other (See Comments)    "Stomach pains" Has patient had a PCN reaction causing immediate rash, facial/tongue/throat swelling, SOB or lightheadedness with hypotension: Unk Has patient had a PCN reaction causing severe rash involving mucus membranes or skin necrosis: Unk Has patient had a PCN reaction that required hospitalization: Unk Has patient had a PCN reaction occurring within the last 10 years: No If all of the above answers are "NO", then may proceed with Cephalosporin use.      Social History   Socioeconomic History  . Marital status: Single    Spouse name: Not on file  . Number of children: Not on file  . Years of education: Not on file  . Highest education level: Not on file  Social Needs  .  Financial resource strain: Not on file  . Food insecurity - worry: Not on file  . Food insecurity - inability: Not on file  . Transportation needs - medical: Not on file  . Transportation needs - non-medical: Not on file  Occupational History  . Occupation: "it don't work for me", on disability  Tobacco Use  . Smoking status: Former Smoker    Packs/day: 0.10    Years: 45.00    Pack years: 4.50    Last attempt to quit: 2012     Years since quitting: 7.0  . Smokeless tobacco: Never Used  Substance and Sexual Activity  . Alcohol use: No    Frequency: Never    Comment: Pt states no etoh since April 2014  . Drug use: No    Comment: Prior use of crack cocaine, quit 2012  . Sexual activity: No  Other Topics Concern  . Not on file  Social History Narrative  . Not on file    Physical Exam  Pulmonary/Chest: No respiratory distress. He has no wheezes. He has no rales.  Abdominal: He exhibits no distension. There is no guarding.  Musculoskeletal: He exhibits no edema.  Skin: Skin is warm and dry. He is not diaphoretic.        Future Appointments  Date Time Provider [place]  08/13/2017 11:00 AM [facility] PHARMACY [facility] None  08/28/2017 11:30 AM [facility] PA/NP [facility] None    ATF pt CAO x4 sitting in the living room. Pt stated that he had an asthma attack this am around 7am and he used his inhaler and nebulizer.  He stated that it has been a while since he had a asthma attack but he feels a lot better.  Pt denies chest pain, sob and dizziness. He stated that when he woke up this am he was dizzy but he think that it was due to asthma.  Pt has taken all of his medications without missing any.  Pt refilled his pill box with my assistance during this visit.  He only missed one medication and we went over what it was and how to take it.  Pt is still not eating foods with sodium.  He stated that he hasn't eaten fast food in about a week.  rx bottles verified and pill box refilled.  BP 132/68 (BP Location: Left Arm, Patient Position: Sitting, Cuff Size: Normal)   Pulse 65   Resp 16   Wt 152 lb 9.6 oz (69.2 kg)   SpO2 97%   BMI 21.28 kg/m   Weight yesterday-152 Last visit weight-153  **rx called in: furesimide bidil   Elbie Statzer, EMT Paramedic 08/07/2017    ACTION: Home visit completed Next visit planned for next week

## 2017-08-13 ENCOUNTER — Ambulatory Visit (HOSPITAL_COMMUNITY)
Admission: RE | Admit: 2017-08-13 | Discharge: 2017-08-13 | Disposition: A | Discharge status: Home / Self Care | Payer: Medicaid Other | Source: Ambulatory Visit | Attending: Cardiology | Admitting: Cardiology

## 2017-08-13 DIAGNOSIS — I493 Ventricular premature depolarization: Secondary | ICD-10-CM | POA: Insufficient documentation

## 2017-08-13 DIAGNOSIS — N189 Chronic kidney disease, unspecified: Secondary | ICD-10-CM | POA: Insufficient documentation

## 2017-08-13 DIAGNOSIS — Z550 Illiteracy and low-level literacy: Secondary | ICD-10-CM | POA: Diagnosis not present

## 2017-08-13 DIAGNOSIS — J441 Chronic obstructive pulmonary disease with (acute) exacerbation: Secondary | ICD-10-CM | POA: Diagnosis not present

## 2017-08-13 DIAGNOSIS — I130 Hypertensive heart and chronic kidney disease with heart failure and stage 1 through stage 4 chronic kidney disease, or unspecified chronic kidney disease: Secondary | ICD-10-CM | POA: Diagnosis not present

## 2017-08-13 DIAGNOSIS — I5022 Chronic systolic (congestive) heart failure: Secondary | ICD-10-CM | POA: Insufficient documentation

## 2017-08-13 DIAGNOSIS — Q600 Renal agenesis, unilateral: Secondary | ICD-10-CM | POA: Insufficient documentation

## 2017-08-13 MED ORDER — LEVOFLOXACIN 750 MG PO TABS: 750.0000 mg | ORAL_TABLET | ORAL | 0 refills | Status: DC

## 2017-08-13 MED ORDER — LOSARTAN POTASSIUM 25 MG PO TABS: 25.0000 mg | ORAL_TABLET | Freq: Every day | ORAL | 5 refills | Status: DC

## 2017-08-13 NOTE — Progress Notes (Signed)
HF MD: [name]  HPI:  Joe Garcia is a 67 y.o. African American male with PMH of systolic CHF due to NICM, HTN, HLD, Asthma, and solitary kidney. Former crack cocaine abuse.   Admitted 11/14 - 06/15/17 with CP and DOE. Received steroids and nebs. BNP minimally elevated on arrival. Troponin negative.  R/LHC below with normal cors and compensated hemodynamics. 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   Re-admitted 11/21-11/23/18 with cramping abdominal pain and GI symptoms. CT abdomen with no negative findings, treated as gastroenteritis. AKI noted with rise of creatinine from 1.0 -> 2.0 on admit. Improved to 1.7 at discharge. Pt states he never picked up [facility] after he left the hospital initially.   Today he returns for pharmacist-led HF medication titration. At last HF clinic visit on 07/31/17, his Bidil was increased to 2 tablets TID. He feels ok today but has been dealing with a "head cold" for a few weeks for which he was given a course of doxycycline and prednisone with minimal improvement. He continues to have chills, headaches and a productive cough. Dee with paramedicine has been seeing him weekly at his home to help with medications. He brought all of his bottles with him as well as his pillbox and reports great compliance. Denies SOB/PND/Orthopnea. Appetite still low x 2-3 weeks.   . Shortness of breath/dyspnea on exertion? no  . Orthopnea/PND? Yes - stable . Edema? no . Lightheadedness/dizziness? Yes - just with the "head cold"   . Daily weights at home? Yes - stable/losing weight . Blood pressure/heart rate monitoring at home? Yes - SBPs 130-150s . Following low-sodium/fluid-restricted diet? No - ate 2 hot dogs today  HF Medications: Carvedilol 3.125 mg PO BID Furosemide 20 mg PO daily Bidil 2 tablets PO TID  Precautions: Entresto - not comfortable taking 2/2 solitary kidney Lisinopril - angioedema (lip/tongue swelling in 2014 - not admitted to  hospital)  Has the patient been experiencing any side effects to the medications prescribed?  no  Does the patient have any problems obtaining medications due to transportation or finances?   No - [place] Medicaid  Understanding of regimen: fair Understanding of indications: fair Potential of compliance: good Patient understands to avoid NSAIDs. Patient understands to avoid decongestants.    Pertinent Lab Values: . 07/26/17: Serum creatinine 1.92 (BL ~1.7-1.9), BUN 17, Potassium 4.2, Sodium 136  Vital Signs: . Weight: 151 lb (dry weight: 151-155 lb) . Blood pressure: 166/76 mmHg  . Heart rate: 79 bpm   Assessment: 1. Chronicsystolic CHF (EF 46-27%), due to NICM (?PVC or HTN induced). NYHA class IIsymptoms.  - Volume status stable  - Initiate losartan 25 mg daily for benefits in HF, HTN and CKD (caution with solitary kidney and history of angioedema with lisinopril)  - Continue carvedilol 3.125 mg BID and furosemide 20 mg daily  - Basic disease state pathophysiology, medication indication, mechanism and side effects reviewed at length with patient and he verbalized understanding  2. Unresolved CAP/COPD exacerbation - Seen with Amy Clegg, NP-C  - Course of doxycycline and prednisone complete with minimal resolution of symptoms  - Start LVQ x 5 days 3. PVC's  - Continue amiodarone 200 mg daily  4. HTN - Uncontrolled. Goal SBP <130.  - Home BP better (130-140s), may have a component of white coat HTN but also ate 2 hotdogs this am  - Start losartan as above 5. Solitary Kidney, Congenital  - Follow renal function carefully with med adjustments 6. Illiteracy  - Can not  read pill bottles but Dee (paramedic) helping with medications  Plan: 1) Medication changes: Based on clinical presentation, vital signs and recent labs will start losartan 25 mg daily and LVQ 750 mg QOD x 5 days for unresolved ?CAP/COPD exacerbation 2) Labs: BMET in 2 weeks at f/u visit  3) Follow-up: PA/NP on  08/28/17    Erika K. Velva Harman, PharmD, BCPS, CPP Clinical Pharmacist Pager: [phone] Phone: [phone] 08/13/2017 10:47 AM   Agree with careful initiation of losartan as well as ABX for persistent URI.   Glori Bickers, MD  11:52 PM

## 2017-08-13 NOTE — Patient Instructions (Addendum)
It was great to meet you today!  Please START losartan 1 tablet (25 mg) ONCE DAILY.   Please TAKE levofloxacin antibiotic 750 mg tablets EVERY OTHER DAY X 3 DOSES.   Please keep your appointment with Darrick Grinder, NP-C on 08/28/17.

## 2017-08-14 ENCOUNTER — Other Ambulatory Visit (HOSPITAL_COMMUNITY): Payer: Self-pay

## 2017-08-14 NOTE — Progress Notes (Signed)
Paramedicine Encounter    Patient ID: Joe Garcia, male    DOB: 07-17-1951, 67 y.o.   MRN: [id number]    Patient Care Team: Medicine, [facility] as PCP - General  Patient Active Problem List   Diagnosis Date Noted  . Malnutrition of moderate degree 07/16/2017  . Asthma exacerbation 07/14/2017  . Acute respiratory distress 07/14/2017  . 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 Acute on chronic combined systolic and diastolic CHF (congestive heart failure) ([place]) 07/14/2017  . Solitary kidney, congenital 06/23/2017  . AKI (acute kidney injury) ([place]) 06/17/2017  . Epigastric pain 06/17/2017  . Elevated troponin 06/17/2017  . Nonischemic cardiomyopathy ([place])   . Multifocal PVCs   . Chronic combined systolic (congestive) and diastolic (congestive) heart failure ([place]) 06/11/2017  . Influenza A 07/17/2013  . Influenza 07/16/2013  . Fever 07/16/2013  . Tachycardia 07/16/2013  . Alcohol abuse 09/25/2012  . Alcohol dependence ([place]) 09/22/2012  . Essential hypertension 03/22/2007  . DEGENERATIVE DISC DISEASE 03/22/2007  . AVASCULAR NECROSIS 03/22/2007    Current Outpatient Medications:  .  albuterol (PROVENTIL) (2.5 MG/3ML) 0.083% nebulizer solution, Take 3 mLs (2.5 mg total) by nebulization every 6 (six) hours as needed for wheezing or shortness of breath., Disp: 75 mL, Rfl: 12 .  amiodarone (PACERONE) 200 MG tablet, Take 1 tablet (200 mg total) by mouth daily., Disp: 90 tablet, Rfl: 3 .  carvedilol (COREG) 3.125 MG tablet, Take 1 tablet (3.125 mg total) by mouth 2 (two) times daily with a meal., Disp: 60 tablet, Rfl: 5 .  furosemide (LASIX) 20 MG tablet, Take 1 tablet (20 mg total) by mouth daily., Disp: 30 tablet, Rfl: 11 .  isosorbide-hydrALAZINE (BIDIL) 20-37.5 MG tablet, Take 2 tablets by mouth 3 (three) times daily., Disp: 180 tablet, Rfl: 3 .  levofloxacin (LEVAQUIN) 750 MG tablet, Take 1 tablet (750 mg total) by mouth every other day. For 3 doses  (5 days), Disp: 3 tablet, Rfl: 0 .  losartan (COZAAR) 25 MG tablet, Take 1 tablet (25 mg total) by mouth daily., Disp: 30 tablet, Rfl: 5 .  acetaminophen (TYLENOL) 500 MG tablet, Take 500-1,000 mg by mouth every 6 (six) hours as needed (for pain or headaches)., Disp: , Rfl:  .  albuterol (PROVENTIL HFA;VENTOLIN HFA) 108 (90 BASE) MCG/ACT inhaler, Inhale 1-2 puffs into the lungs every 6 (six) hours as needed for wheezing or shortness of breath., Disp: 1 Inhaler, Rfl: 0 .  ipratropium (ATROVENT) 0.02 % nebulizer solution, Take 2.5 mLs (0.5 mg total) by nebulization 4 (four) times daily., Disp: 75 mL, Rfl: 12 .  polyethylene glycol powder (GLYCOLAX/MIRALAX) powder, Take 17 g by mouth daily as needed for mild constipation., Disp: , Rfl:  Allergies  Allergen Reactions  . Lisinopril Swelling    Facial and tongue swelling 10/2012  . Penicillins Other (See Comments)    "Stomach pains" Has patient had a PCN reaction causing immediate rash, facial/tongue/throat swelling, SOB or lightheadedness with hypotension: Unk Has patient had a PCN reaction causing severe rash involving mucus membranes or skin necrosis: Unk Has patient had a PCN reaction that required hospitalization: Unk Has patient had a PCN reaction occurring within the last 10 years: No If all of the above answers are "NO", then may proceed with Cephalosporin use.      Social History   Socioeconomic History  . Marital status: Single    Spouse name: Not on file  . Number of children: Not on file  .  Years of education: Not on file  . Highest education level: Not on file  Social Needs  . Financial resource strain: Not on file  . Food insecurity - worry: Not on file  . Food insecurity - inability: Not on file  . Transportation needs - medical: Not on file  . Transportation needs - non-medical: Not on file  Occupational History  . Occupation: "it don't work for me", on disability  Tobacco Use  . Smoking status: Former Smoker     Packs/day: 0.10    Years: 45.00    Pack years: 4.50    Last attempt to quit: 2012    Years since quitting: 7.0  . Smokeless tobacco: Never Used  Substance and Sexual Activity  . Alcohol use: No    Frequency: Never    Comment: Pt states no etoh since April 2014  . Drug use: No    Comment: Prior use of crack cocaine, quit 2012  . Sexual activity: No  Other Topics Concern  . Not on file  Social History Narrative  . Not on file    Physical Exam  Pulmonary/Chest: No respiratory distress. He has no wheezes.  Abdominal: He exhibits no distension. There is no tenderness. There is no guarding.  Musculoskeletal: He exhibits no edema.  Skin: Skin is warm and dry. He is not diaphoretic.        Future Appointments  Date Time Provider [place]  08/28/2017 11:30 AM [facility] PA/NP [facility] None    ATF pt CAO x4 sitting in the living room with no complaints.  Pt was seen at the heart failure clinic yesterday and saw erika. Pt had med changes and his pill box was filled today according to the revisions.  Pt denies sob, chest pain or dizziness.  Pt vitals noted. Pt stated that he feels great and he has a lot more energy. Pt also stated that he used his inhaler 4 times today.  I advised pt that its not normal that he had to use it so often and we talked about what could be the possible causes.  Pt lung sounds clear at this time.    BP (!) 156/78 (BP Location: Left Arm, Patient Position: Sitting, Cuff Size: Normal)   Pulse 68   Resp 16   Wt 147 lb (66.7 kg)   SpO2 98%   BMI 20.50 kg/m   Weight yesterday-147 Last visit weight-152    Shana Younge, EMT Paramedic 08/14/2017    ACTION: Next visit planned for next week

## 2017-08-28 ENCOUNTER — Ambulatory Visit (HOSPITAL_COMMUNITY)
Admission: EM | Admit: 2017-08-28 | Discharge: 2017-08-28 | Disposition: A | Discharge status: Home / Self Care | Payer: Medicaid Other | Attending: Internal Medicine | Admitting: Internal Medicine

## 2017-08-28 ENCOUNTER — Encounter (HOSPITAL_COMMUNITY): Payer: Self-pay

## 2017-08-28 ENCOUNTER — Encounter (HOSPITAL_COMMUNITY): Payer: Self-pay | Admitting: Family Medicine

## 2017-08-28 ENCOUNTER — Ambulatory Visit (HOSPITAL_COMMUNITY)
Admission: RE | Admit: 2017-08-28 | Discharge: 2017-08-28 | Disposition: A | Discharge status: Home / Self Care | Payer: Medicaid Other | Source: Ambulatory Visit | Attending: Cardiology | Admitting: Cardiology

## 2017-08-28 VITALS — BP 158/82 | HR 66 | Wt 153.0 lb

## 2017-08-28 DIAGNOSIS — E785 Hyperlipidemia, unspecified: Secondary | ICD-10-CM | POA: Insufficient documentation

## 2017-08-28 DIAGNOSIS — I5042 Chronic combined systolic (congestive) and diastolic (congestive) heart failure: Secondary | ICD-10-CM | POA: Diagnosis not present

## 2017-08-28 DIAGNOSIS — F1020 Alcohol dependence, uncomplicated: Secondary | ICD-10-CM | POA: Diagnosis not present

## 2017-08-28 DIAGNOSIS — I429 Cardiomyopathy, unspecified: Secondary | ICD-10-CM | POA: Insufficient documentation

## 2017-08-28 DIAGNOSIS — I493 Ventricular premature depolarization: Secondary | ICD-10-CM | POA: Insufficient documentation

## 2017-08-28 DIAGNOSIS — I10 Essential (primary) hypertension: Secondary | ICD-10-CM

## 2017-08-28 DIAGNOSIS — B9789 Other viral agents as the cause of diseases classified elsewhere: Secondary | ICD-10-CM | POA: Diagnosis not present

## 2017-08-28 DIAGNOSIS — Z79899 Other long term (current) drug therapy: Secondary | ICD-10-CM | POA: Diagnosis not present

## 2017-08-28 DIAGNOSIS — J069 Acute upper respiratory infection, unspecified: Secondary | ICD-10-CM

## 2017-08-28 DIAGNOSIS — F1411 Cocaine abuse, in remission: Secondary | ICD-10-CM | POA: Insufficient documentation

## 2017-08-28 DIAGNOSIS — R05 Cough: Secondary | ICD-10-CM

## 2017-08-28 DIAGNOSIS — Q600 Renal agenesis, unilateral: Secondary | ICD-10-CM | POA: Insufficient documentation

## 2017-08-28 DIAGNOSIS — Z550 Illiteracy and low-level literacy: Secondary | ICD-10-CM | POA: Diagnosis not present

## 2017-08-28 DIAGNOSIS — I110 Hypertensive heart disease with heart failure: Secondary | ICD-10-CM | POA: Diagnosis not present

## 2017-08-28 DIAGNOSIS — Z87891 Personal history of nicotine dependence: Secondary | ICD-10-CM | POA: Diagnosis not present

## 2017-08-28 DIAGNOSIS — J45909 Unspecified asthma, uncomplicated: Secondary | ICD-10-CM | POA: Insufficient documentation

## 2017-08-28 DIAGNOSIS — F329 Major depressive disorder, single episode, unspecified: Secondary | ICD-10-CM | POA: Diagnosis not present

## 2017-08-28 LAB — BASIC METABOLIC PANEL
Anion gap: 8 (ref 5–15)
BUN: 19 mg/dL (ref 6–20)
CALCIUM: 9.1 mg/dL (ref 8.9–10.3)
CO2: 22 mmol/L (ref 22–32)
CREATININE: 1.55 mg/dL — AB (ref 0.61–1.24)
Chloride: 109 mmol/L (ref 101–111)
GFR calc Af Amer: 53 mL/min — ABNORMAL LOW (ref >60)
GFR calc non Af Amer: 46 mL/min — ABNORMAL LOW (ref >60)
Glucose, Bld: 91 mg/dL (ref 65–99)
Potassium: 4.1 mmol/L (ref 3.5–5.1)
SODIUM: 139 mmol/L (ref 135–145)

## 2017-08-28 MED ORDER — FLUTICASONE PROPIONATE 50 MCG/ACT NA SUSP: 2.0000 | Freq: Every day | NASAL | 0 refills | Status: DC

## 2017-08-28 MED ORDER — CETIRIZINE HCL 5 MG PO TABS: 5.0000 mg | ORAL_TABLET | Freq: Every day | ORAL | 0 refills | Status: DC

## 2017-08-28 MED ORDER — IPRATROPIUM-ALBUTEROL 0.5-2.5 (3) MG/3ML IN SOLN
RESPIRATORY_TRACT | Status: AC
  Filled 2017-08-28: qty 3

## 2017-08-28 MED ORDER — DOXYCYCLINE HYCLATE 100 MG PO CAPS: 100.0000 mg | ORAL_CAPSULE | Freq: Two times a day (BID) | ORAL | 0 refills | Status: AC

## 2017-08-28 MED ORDER — IPRATROPIUM-ALBUTEROL 0.5-2.5 (3) MG/3ML IN SOLN
3.0000 mL | Freq: Once | RESPIRATORY_TRACT | Status: AC
  Administered 2017-08-28: 3 mL via RESPIRATORY_TRACT

## 2017-08-28 MED ORDER — PREDNISONE 20 MG PO TABS: 40.0000 mg | ORAL_TABLET | Freq: Every day | ORAL | 0 refills | Status: AC

## 2017-08-28 MED ORDER — BENZONATATE 100 MG PO CAPS: 200.0000 mg | ORAL_CAPSULE | Freq: Three times a day (TID) | ORAL | 0 refills | Status: AC

## 2017-08-28 NOTE — ED Provider Notes (Signed)
[facility]    CSN: [id number] Arrival date & time: 08/28/17  1228     History   Chief Complaint Chief Complaint  Patient presents with  . Cough    HPI Joe Garcia is a 67 y.o. male history of hypertension, CHF, solitary kidney, CKD, asthma/COPD presenting with cough and chest congestion since Tuesday- 4 days.  States he feels like he has developed a head cold and is starting to move down into his chest.  He has been using his nebulizers at home as well as albuterol inhaler, but has not started any additional over-the-counter medicines to control his symptoms.  He denies any fever, nausea, vomiting, diarrhea.  Does endorse significant mucus production.  Does endorse mild shortness of breath, denies chest pain.  Is no longer a current smoker.  HPI  Past Medical History:  Diagnosis Date  . Alcoholism ([place])   . Arthritis    hips, L shoulder  . Asthma   . CHF (congestive heart failure) ([place])   . Depression   . Family history of anesthesia complication   . Hyperlipidemia   . Hypertension     Patient Active Problem List   Diagnosis Date Noted  . Malnutrition of moderate degree 07/16/2017  . Asthma exacerbation 07/14/2017  . Acute respiratory distress 07/14/2017  . 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 Acute on chronic combined systolic and diastolic CHF (congestive heart failure) ([place]) 07/14/2017  . Solitary kidney, congenital 06/23/2017  . AKI (acute kidney injury) ([place]) 06/17/2017  . Epigastric pain 06/17/2017  . Elevated troponin 06/17/2017  . Nonischemic cardiomyopathy ([place])   . Multifocal PVCs   . Chronic combined systolic (congestive) and diastolic (congestive) heart failure ([place]) 06/11/2017  . Influenza A 07/17/2013  . Influenza 07/16/2013  . Fever 07/16/2013  . Tachycardia 07/16/2013  . Alcohol abuse 09/25/2012  . Alcohol dependence ([place]) 09/22/2012  . Essential hypertension 03/22/2007  . DEGENERATIVE DISC DISEASE 03/22/2007  .  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 RIGHT/LEFT HEART CATH AND CORONARY ANGIOGRAPHY N/A 06/12/2017   Procedure: RIGHT/LEFT HEART CATH AND CORONARY ANGIOGRAPHY;  Surgeon: Jolaine Artist, MD;  Location: [facility];  Service: Cardiovascular;  Laterality: N/A;       Home Medications    Prior to Admission medications   Medication Sig Start Date End Date Taking? Authorizing Provider  acetaminophen (TYLENOL) 500 MG tablet Take 500-1,000 mg by mouth every 6 (six) hours as needed (for pain or headaches).    [provider]  albuterol (PROVENTIL HFA;VENTOLIN HFA) 108 (90 BASE) MCG/ACT inhaler Inhale 1-2 puffs into the lungs every 6 (six) hours as needed for wheezing or shortness of breath. 07/12/15   Barrett, Lahoma Crocker, PA-C  albuterol (PROVENTIL) (2.5 MG/3ML) 0.083% nebulizer solution Take 3 mLs (2.5 mg total) by nebulization every 6 (six) hours as needed for wheezing or shortness of breath. 10/16/15   Kirichenko, Lahoma Rocker, PA-C  amiodarone (PACERONE) 200 MG tablet Take 1 tablet (200 mg total) by mouth daily. 07/13/17   Clegg, Amy D, NP  benzonatate (TESSALON) 100 MG capsule Take 2 capsules (200 mg total) by mouth every 8 (eight) hours for 5 days. 08/28/17 09/02/17  Lekesha Claw C, PA-C  carvedilol (COREG) 3.125 MG tablet Take 1 tablet (3.125 mg total) by mouth 2 (two) times daily with a meal. 07/24/17   Clegg, Amy D, NP  cetirizine (ZYRTEC) 5 MG tablet Take 1 tablet (5 mg total)  by mouth daily for 10 days. 08/28/17 09/07/17  Athol Bolds C, PA-C  doxycycline (VIBRAMYCIN) 100 MG capsule Take 1 capsule (100 mg total) by mouth 2 (two) times daily for 7 days. 08/28/17 09/04/17  Vinod Mikesell C, PA-C  fluticasone (FLONASE) 50 MCG/ACT nasal spray Place 2 sprays into both nostrils daily for 5 days. 08/28/17 09/02/17  Krew Hortman C, PA-C  ipratropium (ATROVENT) 0.02 % nebulizer solution Take 2.5 mLs (0.5 mg total) by nebulization 4 (four) times  daily. 07/26/17   Pisciotta, Elmyra Ricks, PA-C  isosorbide-hydrALAZINE (BIDIL) 20-37.5 MG tablet Take 2 tablets by mouth 3 (three) times daily. 08/07/17   Clegg, Amy D, NP  losartan (COZAAR) 25 MG tablet Take 1 tablet (25 mg total) by mouth daily. 08/13/17   Bensimhon, Shaune Pascal, MD  polyethylene glycol powder (GLYCOLAX/MIRALAX) powder Take 17 g by mouth daily as needed for mild constipation.    [provider]  predniSONE (DELTASONE) 20 MG tablet Take 2 tablets (40 mg total) by mouth daily for 3 days. 08/28/17 08/31/17  Keyonta Barradas, Elesa Hacker, PA-C    Family History Family History  Problem Relation Age of Onset  . Heart disease Father   . Heart disease Daughter        "fluid around heart"     Social History Social History   Tobacco Use  . Smoking status: Former Smoker    Packs/day: 0.10    Years: 45.00    Pack years: 4.50    Last attempt to quit: 2012    Years since quitting: 7.0  . Smokeless tobacco: Never Used  Substance Use Topics  . Alcohol use: No    Frequency: Never    Comment: Pt states no etoh since April 2014  . Drug use: No    Comment: Prior use of crack cocaine, quit 2012     Allergies   Lisinopril and Penicillins   Review of Systems Review of Systems  Constitutional: Negative for activity change, appetite change, fatigue and fever.  HENT: Positive for congestion, postnasal drip, rhinorrhea and sinus pressure. Negative for ear pain and sore throat.   Eyes: Negative for pain and itching.  Respiratory: Positive for cough. Negative for shortness of breath.   Cardiovascular: Negative for chest pain.  Gastrointestinal: Negative for abdominal pain, diarrhea, nausea and vomiting.  Musculoskeletal: Negative for myalgias.  Skin: Negative for rash.  Neurological: Negative for dizziness, light-headedness and headaches.     Physical Exam Triage Vital Signs ED Triage Vitals [08/28/17 1254]  Enc Vitals Group     BP 138/72     Pulse Rate 63     Resp 18     Temp 98.3  F (36.8 C)     Temp src      SpO2 97 %     Weight      Height      Head Circumference      Peak Flow      Pain Score      Pain Loc      Pain Edu?      Excl. in [place]?    No data found.  Updated Vital Signs BP 138/72   Pulse 63   Temp 98.3 F (36.8 C)   Resp 18   SpO2 97%    Physical Exam  Constitutional: He appears well-developed and well-nourished.  HENT:  Head: Normocephalic and atraumatic.  Mouth/Throat: Uvula is midline and mucous membranes are normal. No oral lesions. No trismus in the jaw. No uvula swelling. Posterior  oropharyngeal erythema present. Tonsils are 1+ on the right. Tonsils are 1+ on the left. No tonsillar exudate.  Bilateral EACs full of wax, TM unable to be visualized.  Erythematous turbinates with rhinorrhea present  Eyes: Conjunctivae are normal.  Neck: Neck supple.  Cardiovascular: Normal rate and regular rhythm.  No murmur heard. Pulmonary/Chest: Effort normal and breath sounds normal. No respiratory distress.  Frequent cough and bronchospasm in room associated with wheezing.  Breathing comfortably at rest, not struggling to breathe.  Abdominal: Soft. There is no tenderness.  Musculoskeletal: He exhibits no edema.  Neurological: He is alert.  Skin: Skin is warm and dry.  Psychiatric: He has a normal mood and affect.  Nursing note and vitals reviewed.    UC Treatments / Results  Labs (all labs ordered are listed, but only abnormal results are displayed) Labs Reviewed - No data to display  EKG  EKG Interpretation None       Radiology No results found.  Procedures Procedures (including critical care time)  Medications Ordered in UC Medications  ipratropium-albuterol (DUONEB) 0.5-2.5 (3) MG/3ML nebulizer solution 3 mL (not administered)     Initial Impression / Assessment and Plan / UC Course  I have reviewed the triage vital signs and the nursing notes.  Pertinent labs & imaging results that were available during my care of  the patient were reviewed by me and considered in my medical decision making (see chart for details).     Patient with history of COPD, and with cough and congestion.  Will provide breathing treatment today in clinic-last breathing treatment was this morning around 7.  Will send home with prednisone 40 mg for 3 days, doxycycline twice daily for 7 days.  Avoiding azithromycin since he is also on amiodarone and both prolonged QT.  Tessalon for cough, Zyrtec 5 mg daily due to CKD and Flonase.  Discussed strict return precautions. Patient verbalized understanding and is agreeable with plan.   Final Clinical Impressions(s) / UC Diagnoses   Final diagnoses:  Viral URI with cough    ED Discharge Orders        Ordered    predniSONE (DELTASONE) 20 MG tablet  Daily     08/28/17 1347    benzonatate (TESSALON) 100 MG capsule  Every 8 hours     08/28/17 1347    fluticasone (FLONASE) 50 MCG/ACT nasal spray  Daily     08/28/17 1347    cetirizine (ZYRTEC) 5 MG tablet  Daily     08/28/17 1347    doxycycline (VIBRAMYCIN) 100 MG capsule  2 times daily     08/28/17 1347       Controlled Substance Prescriptions [place] Controlled Substance Registry consulted? Not Applicable   Janith Lima, [place] 08/28/17 2112

## 2017-08-28 NOTE — Progress Notes (Signed)
Advanced Heart Failure Clinic Note   Primary Cardiologist: Dr. Gwenlyn Found HF: Dr. [name]  PCP: None   HPI: Joe Garcia is a 67 y.o. 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 Former crack cocaine abuse.   Admitted 11/14 - 06/15/17 with CP and DOE. Received steroids and nebs. BNP minimally elevated on arrival. Troponin negative.  R/LHC below with normal cors and compensated hemodynamics. 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   Re-admitted 11/21-11/23/18 with cramping abdominal pain and GI symptoms. CT abdomen with no negative findings, treated as gastroenteritis. AKI noted with rise of creatinine from 1.0 -> 2.0 on admit. Improved to 1.7 at discharge. Pt states he never picked up [facility] after he left the hospital initially.   Today he returns for HF follow up. Refuses entresto but agreeable to losartan. Losartan was started 2 weeks ago. Just completed levaquin course a few weeks ago. Overall feeling bad. Has recurrent URI. Dizzy when standing. Denies PND/Orthopnea. Appetite ok. Having chills. No fevers.  Weight at home 146-148 pounds. Taking all medications. Followed by Paramedicine. SBP at home 130-150.   ECHO 05/06/2017 EF 30-35% Grade IDD  Review of systems complete and found to be negative unless listed in HPI.    Past Medical History:  Diagnosis Date  . Alcoholism ([place])   . Arthritis    hips, L shoulder  . Asthma   . CHF (congestive heart failure) ([place])   . Depression   . Family history of anesthesia complication   . Hyperlipidemia   . Hypertension    Current Outpatient Medications  Medication Sig Dispense Refill  . acetaminophen (TYLENOL) 500 MG tablet Take 500-1,000 mg by mouth every 6 (six) hours as needed (for pain or headaches).    Marland Kitchen albuterol (PROVENTIL HFA;VENTOLIN HFA) 108 (90 BASE) MCG/ACT inhaler Inhale 1-2 puffs into the lungs every 6 (six) hours as needed for wheezing or shortness of breath. 1 Inhaler 0  .  albuterol (PROVENTIL) (2.5 MG/3ML) 0.083% nebulizer solution Take 3 mLs (2.5 mg total) by nebulization every 6 (six) hours as needed for wheezing or shortness of breath. 75 mL 12  . amiodarone (PACERONE) 200 MG tablet Take 1 tablet (200 mg total) by mouth daily. 90 tablet 3  . carvedilol (COREG) 3.125 MG tablet Take 1 tablet (3.125 mg total) by mouth 2 (two) times daily with a meal. 60 tablet 5  . furosemide (LASIX) 20 MG tablet Take 1 tablet (20 mg total) by mouth daily. 30 tablet 11  . ipratropium (ATROVENT) 0.02 % nebulizer solution Take 2.5 mLs (0.5 mg total) by nebulization 4 (four) times daily. 75 mL 12  . isosorbide-hydrALAZINE (BIDIL) 20-37.5 MG tablet Take 2 tablets by mouth 3 (three) times daily. 180 tablet 3  . losartan (COZAAR) 25 MG tablet Take 1 tablet (25 mg total) by mouth daily. 30 tablet 5  . polyethylene glycol powder (GLYCOLAX/MIRALAX) powder Take 17 g by mouth daily as needed for mild constipation.     No current facility-administered medications for this encounter.    Allergies  Allergen Reactions  . Lisinopril Swelling    Facial and tongue swelling 10/2012  . Penicillins Other (See Comments)    "Stomach pains" Has patient had a PCN reaction causing immediate rash, facial/tongue/throat swelling, SOB or lightheadedness with hypotension: Unk Has patient had a PCN reaction causing severe rash involving mucus membranes or skin necrosis: Unk Has patient had a PCN reaction that required hospitalization: Unk Has patient had  a PCN reaction occurring within the last 10 years: No If all of the above answers are "NO", then may proceed with Cephalosporin use.    Social History   Socioeconomic History  . Marital status: Single    Spouse name: Not on file  . Number of children: Not on file  . Years of education: Not on file  . Highest education level: Not on file  Social Needs  . Financial resource strain: Not on file  . Food insecurity - worry: Not on file  . Food  insecurity - inability: Not on file  . Transportation needs - medical: Not on file  . Transportation needs - non-medical: Not on file  Occupational History  . Occupation: "it don't work for me", on disability  Tobacco Use  . Smoking status: Former Smoker    Packs/day: 0.10    Years: 45.00    Pack years: 4.50    Last attempt to quit: 2012    Years since quitting: 7.0  . Smokeless tobacco: Never Used  Substance and Sexual Activity  . Alcohol use: No    Frequency: Never    Comment: Pt states no etoh since April 2014  . Drug use: No    Comment: Prior use of crack cocaine, quit 2012  . Sexual activity: No  Other Topics Concern  . Not on file  Social History Narrative  . Not on file    Family History  Problem Relation Age of Onset  . Heart disease Father   . Heart disease Daughter        "fluid around heart"    Vitals:   08/28/17 1127  BP: (!) 158/82  Pulse: 66  SpO2: 100%  Weight: 153 lb (69.4 kg)  Sitting 158/82 Standing 148/78  Wt Readings from Last 3 Encounters:  08/28/17 153 lb (69.4 kg)  08/14/17 147 lb (66.7 kg)  08/13/17 151 lb 8 oz (68.7 kg)   PHYSICAL EXAM: General:  Thin, well appearing. No resp difficulty HEENT: normal Neck: supple. no JVD. Carotids 2+ bilat; no bruits. No lymphadenopathy or thryomegaly appreciated. Cor: PMI nondisplaced. Regular rate & rhythm. No rubs, gallops or murmurs. Lungs: EW throughout  Abdomen: soft, nontender, nondistended. No hepatosplenomegaly. No bruits or masses. Good bowel sounds. Extremities: no cyanosis, clubbing, rash, edema Neuro: alert & orientedx3, cranial nerves grossly intact. moves all 4 extremities w/o difficulty. Affect pleasant  EKG: NSR 69 bpm   SSESSMENT & PLAN: 1. Chronic Systolic Heart Failure. ECHO 05/06/2017 EF 30-35%. NICM. 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 Well compensated hemodynamics.  - NYHA II.  Volume status low. Stop lasix.  - Continue coreg 3.125 mg BID.    -Continue bidil to 2 tab twice a day.  -Continue losartan 25 mg dialy. BMEt t -he refuses entresto due to solitary kidney.   2. PVCs -No PVCs on EKG today.   -Continue  amio to 200 mg daily.  3. HTN - Uncontrolled.  Goal SBP < 130. Elevated today but at home SBP 130-150 at home.  4. Solitary Kidney, Congenital - Follow renal function carefully with med adjustments.  - BMET today.  5. Illiteracy Can not read pill bottles. 6. URI Plan to see PCP. Recently completed 5 days of levaquin.  7. Asthma- per PCP  No uptitration of HF medications for URI and dizziness.   Continue Paramedicine. Follow up in 4 weeks with an ECHO and Dr [name].   Darrick Grinder, NP 08/28/17

## 2017-08-28 NOTE — ED Triage Notes (Signed)
Pt here for cough and congestion since Tuesday.

## 2017-08-28 NOTE — Patient Instructions (Signed)
STOP Lasix.  Routine lab work today. Will notify you of abnormal results, otherwise no news is good news!  Follow up 4 weeks with Dr. [name] and echocardiogram.  Take all medication as prescribed the day of your appointment. Bring all medications with you to your appointment.  Do the following things EVERYDAY: 1) Weigh yourself in the morning before breakfast. Write it down and keep it in a log. 2) Take your medicines as prescribed 3) Eat low salt foods-Limit salt (sodium) to 2000 mg per day.  4) Stay as active as you can everyday 5) Limit all fluids for the day to less than 2 liters

## 2017-08-28 NOTE — Discharge Instructions (Signed)
Please continue using your albuterol inhaler nebulizers at home as prescribed.  Start taking doxycycline twice daily for 7 days.  Please take prednisone 40 mg for the next 3 days.  For congestion I have sent in Zyrtec 5 mg for you to take daily.  And Flonase nasal spray for you to use in each nostril once daily also.  For cough please use Tessalon every 8 hours as needed.  Please return if symptoms not improving with treatment, develop worsening shortness of breath, difficulty breathing, fever.

## 2017-08-30 ENCOUNTER — Emergency Department (HOSPITAL_COMMUNITY)
Admission: EM | Admit: 2017-08-30 | Discharge: 2017-08-30 | Disposition: A | Discharge status: Home / Self Care | Payer: Medicaid Other | Attending: Emergency Medicine | Admitting: Emergency Medicine

## 2017-08-30 ENCOUNTER — Telehealth: Payer: Self-pay | Admitting: Nurse Practitioner

## 2017-08-30 ENCOUNTER — Emergency Department (HOSPITAL_COMMUNITY): Payer: Medicaid Other

## 2017-08-30 DIAGNOSIS — N182 Chronic kidney disease, stage 2 (mild): Secondary | ICD-10-CM | POA: Diagnosis not present

## 2017-08-30 DIAGNOSIS — J45909 Unspecified asthma, uncomplicated: Secondary | ICD-10-CM | POA: Insufficient documentation

## 2017-08-30 DIAGNOSIS — I5043 Acute on chronic combined systolic (congestive) and diastolic (congestive) heart failure: Secondary | ICD-10-CM | POA: Insufficient documentation

## 2017-08-30 DIAGNOSIS — Z87891 Personal history of nicotine dependence: Secondary | ICD-10-CM | POA: Diagnosis not present

## 2017-08-30 DIAGNOSIS — T486X5A Adverse effect of antiasthmatics, initial encounter: Secondary | ICD-10-CM | POA: Insufficient documentation

## 2017-08-30 DIAGNOSIS — Z79899 Other long term (current) drug therapy: Secondary | ICD-10-CM | POA: Insufficient documentation

## 2017-08-30 DIAGNOSIS — I493 Ventricular premature depolarization: Secondary | ICD-10-CM | POA: Diagnosis not present

## 2017-08-30 DIAGNOSIS — I130 Hypertensive heart and chronic kidney disease with heart failure and stage 1 through stage 4 chronic kidney disease, or unspecified chronic kidney disease: Secondary | ICD-10-CM | POA: Insufficient documentation

## 2017-08-30 DIAGNOSIS — R001 Bradycardia, unspecified: Secondary | ICD-10-CM | POA: Diagnosis present

## 2017-08-30 LAB — COMPREHENSIVE METABOLIC PANEL
ALK PHOS: 60 U/L (ref 38–126)
ALT: 20 U/L (ref 17–63)
ANION GAP: 12 (ref 5–15)
AST: 21 U/L (ref 15–41)
Albumin: 3.6 g/dL (ref 3.5–5.0)
BILIRUBIN TOTAL: 0.5 mg/dL (ref 0.3–1.2)
BUN: 23 mg/dL — ABNORMAL HIGH (ref 6–20)
CALCIUM: 9.3 mg/dL (ref 8.9–10.3)
CO2: 19 mmol/L — ABNORMAL LOW (ref 22–32)
Chloride: 107 mmol/L (ref 101–111)
Creatinine, Ser: 1.4 mg/dL — ABNORMAL HIGH (ref 0.61–1.24)
GFR, EST AFRICAN AMERICAN: 60 mL/min — AB (ref >60)
GFR, EST NON AFRICAN AMERICAN: 52 mL/min — AB (ref >60)
Glucose, Bld: 108 mg/dL — ABNORMAL HIGH (ref 65–99)
Potassium: 4 mmol/L (ref 3.5–5.1)
SODIUM: 138 mmol/L (ref 135–145)
TOTAL PROTEIN: 6.6 g/dL (ref 6.5–8.1)

## 2017-08-30 LAB — CBC WITH DIFFERENTIAL/PLATELET
BASOS ABS: 0 10*3/uL (ref 0.0–0.1)
BASOS PCT: 0 %
Eosinophils Absolute: 0 10*3/uL (ref 0.0–0.7)
Eosinophils Relative: 0 %
HEMATOCRIT: 38.3 % — AB (ref 39.0–52.0)
HEMOGLOBIN: 12.5 g/dL — AB (ref 13.0–17.0)
Lymphocytes Relative: 46 %
Lymphs Abs: 2.2 10*3/uL (ref 0.7–4.0)
MCH: 27.9 pg (ref 26.0–34.0)
MCHC: 32.6 g/dL (ref 30.0–36.0)
MCV: 85.5 fL (ref 78.0–100.0)
Monocytes Absolute: 0.4 10*3/uL (ref 0.1–1.0)
Monocytes Relative: 8 %
NEUTROS ABS: 2.3 10*3/uL (ref 1.7–7.7)
NEUTROS PCT: 46 %
Platelets: 324 10*3/uL (ref 150–400)
RBC: 4.48 MIL/uL (ref 4.22–5.81)
RDW: 15.9 % — ABNORMAL HIGH (ref 11.5–15.5)
WBC: 4.9 10*3/uL (ref 4.0–10.5)

## 2017-08-30 LAB — URINALYSIS, ROUTINE W REFLEX MICROSCOPIC
Bilirubin Urine: NEGATIVE
Glucose, UA: NEGATIVE mg/dL
Hgb urine dipstick: NEGATIVE
KETONES UR: NEGATIVE mg/dL
LEUKOCYTES UA: NEGATIVE
NITRITE: NEGATIVE
PH: 6 (ref 5.0–8.0)
Protein, ur: NEGATIVE mg/dL
SPECIFIC GRAVITY, URINE: 1.008 (ref 1.005–1.030)

## 2017-08-30 LAB — I-STAT CG4 LACTIC ACID, ED: Lactic Acid, Venous: 1.33 mmol/L (ref 0.5–1.9)

## 2017-08-30 LAB — I-STAT TROPONIN, ED: TROPONIN I, POC: 0.01 ng/mL (ref 0.00–0.08)

## 2017-08-30 LAB — MAGNESIUM: MAGNESIUM: 1.8 mg/dL (ref 1.7–2.4)

## 2017-08-30 NOTE — ED Notes (Signed)
Pt reports he has been using albuterol inhaler and nebulizer frequently.  Pt having arm/hand tremors.  Started antibiotic yesterday for URI.

## 2017-08-30 NOTE — Telephone Encounter (Signed)
   Pt called this afternoon to report that his BP is 202/71 and HR 35.  He has taken his AM meds today.  He has had a cold and has been taking some cold medicine.  He denies presyncope, chest pain, or dyspnea.  I note that he does have a h/ PVC's.  I rec that given marked HTN, with significant bradycardia, he should come to the ED for eval.  Caller verbalized understanding and was grateful for the call back.  Murray Hodgkins, NP 08/30/2017, 12:51 PM

## 2017-08-30 NOTE — ED Triage Notes (Signed)
Pt c/o high blood pressure and slow heart rate when he checked it at home -- pt also c/o flu like symptoms -- seen at urgent care on Friday for same-- headache, sore throat, body aches. Pt also c/o head and chest cold

## 2017-08-30 NOTE — ED Notes (Signed)
Patient given bag meal with drink. 

## 2017-08-30 NOTE — ED Provider Notes (Signed)
[facility] EMERGENCY DEPARTMENT Provider Note   CSN: [id number] Arrival date & time: 08/30/17  1316     History   Chief Complaint Chief Complaint  Patient presents with  . Hypertension  . Bradycardia  . flu like symptoms    HPI Joe Garcia is a 67 y.o. male.  HPI Patient with recent diagnosis of URI 2 days ago.  Complains of sinus pressure, nasal congestion, shortness of breath and wheezing.  Denies chest pain.  Was started on prednisone, Tessalon Perles, Flonase, Zyrtec and doxycycline.  Patient admits to using both nebulized and MDI albuterol.  Patient states that he took his blood pressure today and it was elevated.  Also pulse ox heart rate reading was in the 30s.  Denies lightheadedness or near syncope.  States he is feeling tremulous. Past Medical History:  Diagnosis Date  . Alcoholism ([place])   . Arthritis    hips, L shoulder  . Asthma   . CHF (congestive heart failure) ([place])   . Depression   . Family history of anesthesia complication   . Hyperlipidemia   . Hypertension     Patient Active Problem List   Diagnosis Date Noted  . Malnutrition of moderate degree 07/16/2017  . Asthma exacerbation 07/14/2017  . Acute respiratory distress 07/14/2017  . 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 Acute on chronic combined systolic and diastolic CHF (congestive heart failure) ([place]) 07/14/2017  . Solitary kidney, congenital 06/23/2017  . AKI (acute kidney injury) ([place]) 06/17/2017  . Epigastric pain 06/17/2017  . Elevated troponin 06/17/2017  . Nonischemic cardiomyopathy ([place])   . Multifocal PVCs   . Chronic combined systolic (congestive) and diastolic (congestive) heart failure ([place]) 06/11/2017  . Influenza A 07/17/2013  . Influenza 07/16/2013  . Fever 07/16/2013  . Tachycardia 07/16/2013  . Alcohol abuse 09/25/2012  . Alcohol dependence ([place]) 09/22/2012  . Essential hypertension 03/22/2007  . DEGENERATIVE DISC DISEASE 03/22/2007    . 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 RIGHT/LEFT HEART CATH AND CORONARY ANGIOGRAPHY N/A 06/12/2017   Procedure: RIGHT/LEFT HEART CATH AND CORONARY ANGIOGRAPHY;  Surgeon: Jolaine Artist, MD;  Location: [facility];  Service: Cardiovascular;  Laterality: N/A;       Home Medications    Prior to Admission medications   Medication Sig Start Date End Date Taking? Authorizing Provider  acetaminophen (TYLENOL) 500 MG tablet Take 500-1,000 mg by mouth every 6 (six) hours as needed (for pain or headaches).    [provider]  albuterol (PROVENTIL HFA;VENTOLIN HFA) 108 (90 BASE) MCG/ACT inhaler Inhale 1-2 puffs into the lungs every 6 (six) hours as needed for wheezing or shortness of breath. 07/12/15   Barrett, Lahoma Crocker, PA-C  albuterol (PROVENTIL) (2.5 MG/3ML) 0.083% nebulizer solution Take 3 mLs (2.5 mg total) by nebulization every 6 (six) hours as needed for wheezing or shortness of breath. 10/16/15   Kirichenko, Lahoma Rocker, PA-C  amiodarone (PACERONE) 200 MG tablet Take 1 tablet (200 mg total) by mouth daily. 07/13/17   Clegg, Amy D, NP  benzonatate (TESSALON) 100 MG capsule Take 2 capsules (200 mg total) by mouth every 8 (eight) hours for 5 days. 08/28/17 09/02/17  Wieters, Hallie C, PA-C  carvedilol (COREG) 3.125 MG tablet Take 1 tablet (3.125 mg total) by mouth 2 (two) times daily with a meal. 07/24/17   Clegg, Amy D, NP  cetirizine (ZYRTEC) 5 MG tablet Take 1 tablet (5 mg total)  by mouth daily for 10 days. 08/28/17 09/07/17  Wieters, Hallie C, PA-C  doxycycline (VIBRAMYCIN) 100 MG capsule Take 1 capsule (100 mg total) by mouth 2 (two) times daily for 7 days. 08/28/17 09/04/17  Wieters, Hallie C, PA-C  fluticasone (FLONASE) 50 MCG/ACT nasal spray Place 2 sprays into both nostrils daily for 5 days. 08/28/17 09/02/17  Wieters, Hallie C, PA-C  ipratropium (ATROVENT) 0.02 % nebulizer solution Take 2.5 mLs (0.5 mg total) by nebulization 4 (four)  times daily. 07/26/17   Pisciotta, Elmyra Ricks, PA-C  isosorbide-hydrALAZINE (BIDIL) 20-37.5 MG tablet Take 2 tablets by mouth 3 (three) times daily. 08/07/17   Clegg, Amy D, NP  losartan (COZAAR) 25 MG tablet Take 1 tablet (25 mg total) by mouth daily. 08/13/17   Bensimhon, Shaune Pascal, MD  polyethylene glycol powder (GLYCOLAX/MIRALAX) powder Take 17 g by mouth daily as needed for mild constipation.    [provider]  predniSONE (DELTASONE) 20 MG tablet Take 2 tablets (40 mg total) by mouth daily for 3 days. 08/28/17 08/31/17  Wieters, Elesa Hacker, PA-C    Family History Family History  Problem Relation Age of Onset  . Heart disease Father   . Heart disease Daughter        "fluid around heart"     Social History Social History   Tobacco Use  . Smoking status: Former Smoker    Packs/day: 0.10    Years: 45.00    Pack years: 4.50    Last attempt to quit: 2012    Years since quitting: 7.0  . Smokeless tobacco: Never Used  Substance Use Topics  . Alcohol use: No    Frequency: Never    Comment: Pt states no etoh since April 2014  . Drug use: No    Comment: Prior use of crack cocaine, quit 2012     Allergies   Lisinopril and Penicillins   Review of Systems Review of Systems  Constitutional: Negative for chills and fever.  HENT: Positive for congestion and sinus pressure. Negative for sore throat.   Respiratory: Positive for cough, shortness of breath and wheezing.   Cardiovascular: Positive for palpitations. Negative for chest pain and leg swelling.  Gastrointestinal: Negative for abdominal pain, constipation, diarrhea, nausea and vomiting.  Genitourinary: Negative for dysuria, flank pain and frequency.  Musculoskeletal: Positive for myalgias. Negative for arthralgias, neck pain and neck stiffness.  Skin: Negative for rash and wound.  Neurological: Positive for tremors. Negative for dizziness, weakness, light-headedness, numbness and headaches.  All other systems reviewed and are  negative.    Physical Exam Updated Vital Signs BP (!) 160/76   Pulse 62   Temp 98.1 F (36.7 C) (Oral)   Resp 13   SpO2 98%   Physical Exam  Constitutional: He is oriented to person, place, and time. He appears well-developed and well-nourished. No distress.  HENT:  Head: Normocephalic and atraumatic.  Mouth/Throat: Oropharynx is clear and moist. No oropharyngeal exudate.  Bilateral nasal mucosal edema.  Patient has left frontal sinus tenderness to percussion.  Oropharynx is mildly erythematous.  Uvula is midline.  Eyes: EOM are normal. Pupils are equal, round, and reactive to light.  Neck: Normal range of motion. Neck supple.  Cardiovascular: Regular rhythm.  Irregular.  Normal rate  Pulmonary/Chest: Breath sounds normal.  Mildly diminished throughout.  Few scattered wheezes.  Abdominal: Soft. Bowel sounds are normal. There is no tenderness. There is no rebound and no guarding.  Musculoskeletal: Normal range of motion. He exhibits no edema or  tenderness.  No lower extremity swelling, asymmetry or tenderness.  No midline thoracic or lumbar tenderness.  No CVA tenderness.  Lymphadenopathy:    He has no cervical adenopathy.  Neurological: He is alert and oriented to person, place, and time.  Moves all extremities without focal deficit.  Sensation fully intact.   Tremor noted in all extremities.  Skin: Skin is warm and dry. Capillary refill takes less than 2 seconds. No rash noted. He is not diaphoretic. No erythema.  Psychiatric: He has a normal mood and affect. His behavior is normal.  Nursing note and vitals reviewed.    ED Treatments / Results  Labs (all labs ordered are listed, but only abnormal results are displayed) Labs Reviewed  COMPREHENSIVE METABOLIC PANEL - Abnormal; Notable for the following components:      Result Value   CO2 19 (*)    Glucose, Bld 108 (*)    BUN 23 (*)    Creatinine, Ser 1.40 (*)    GFR calc non Af Amer 52 (*)    GFR calc Af Amer 60 (*)      All other components within normal limits  CBC WITH DIFFERENTIAL/PLATELET - Abnormal; Notable for the following components:   Hemoglobin 12.5 (*)    HCT 38.3 (*)    RDW 15.9 (*)    All other components within normal limits  URINALYSIS, ROUTINE W REFLEX MICROSCOPIC - Abnormal; Notable for the following components:   Color, Urine STRAW (*)    All other components within normal limits  MAGNESIUM  I-STAT CG4 LACTIC ACID, ED  I-STAT TROPONIN, ED    EKG  EKG Interpretation  Date/Time:  Sunday August 30 2017 14:31:01 EST Ventricular Rate:  69 PR Interval:  140 QRS Duration: 102 QT Interval:  468 QTC Calculation: 501 R Axis:   50 Text Interpretation:  Sinus rhythm with frequent Premature ventricular complexes in a pattern of bigeminy --New Left ventricular hypertrophy Nonspecific ST abnormality Prolonged QT Abnormal ECG Confirmed by Tanna Furry [phone]) on 08/30/2017 2:38:13 PM       Radiology Dg Chest 2 View  Result Date: 08/30/2017 CLINICAL DATA:  Hypertension, bradycardia EXAM: CHEST  2 VIEW COMPARISON:  07/26/2017 FINDINGS: Lungs are clear.  No pleural effusion or pneumothorax. Heart is normal in size. Degenerative changes of the visualized thoracolumbar spine. IMPRESSION: No evidence of acute cardiopulmonary disease. Electronically Signed   By: Julian Hy M.D.   On: 08/30/2017 14:55    Procedures Procedures (including critical care time)  Medications Ordered in ED Medications - No data to display   Initial Impression / Assessment and Plan / ED Course  I have reviewed the triage vital signs and the nursing notes.  Pertinent labs & imaging results that were available during my care of the patient were reviewed by me and considered in my medical decision making (see chart for details).     Heart rate recorded in the 30s patient EKG at that time with heart rate in the 60s.  Believe that the heart rate being recorded in the 30s is being taken off the pulse ox reading.   Patient's heart rate is consistently in the 60s-80s while on the monitor.  He is throwing frequent PVCs likely due to his excessive albuterol use. Patient's hypertension is improving.  Heart rate remains in the 70s and 80s. Final Clinical Impressions(s) / ED Diagnoses   Final diagnoses:  Albuterol adverse reaction, initial encounter  Symptomatic PVCs    ED Discharge Orders  None       Julianne Rice, MD 08/30/17 2047

## 2017-09-02 ENCOUNTER — Other Ambulatory Visit (HOSPITAL_COMMUNITY): Payer: Self-pay

## 2017-09-02 ENCOUNTER — Encounter (HOSPITAL_COMMUNITY): Payer: Self-pay

## 2017-09-02 NOTE — Progress Notes (Signed)
Paramedicine Encounter    Patient ID: Joe Garcia, male    DOB: Jan 23, 1951, 67 y.o.   MRN: [id number]    Patient Care Team: Medicine, [facility] as PCP - General  Patient Active Problem List   Diagnosis Date Noted  . Malnutrition of moderate degree 07/16/2017  . Asthma exacerbation 07/14/2017  . Acute respiratory distress 07/14/2017  . 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 Acute on chronic combined systolic and diastolic CHF (congestive heart failure) ([place]) 07/14/2017  . Solitary kidney, congenital 06/23/2017  . AKI (acute kidney injury) ([place]) 06/17/2017  . Epigastric pain 06/17/2017  . Elevated troponin 06/17/2017  . Nonischemic cardiomyopathy ([place])   . Multifocal PVCs   . Chronic combined systolic (congestive) and diastolic (congestive) heart failure ([place]) 06/11/2017  . Influenza A 07/17/2013  . Influenza 07/16/2013  . Fever 07/16/2013  . Tachycardia 07/16/2013  . Alcohol abuse 09/25/2012  . Alcohol dependence ([place]) 09/22/2012  . Essential hypertension 03/22/2007  . DEGENERATIVE DISC DISEASE 03/22/2007  . AVASCULAR NECROSIS 03/22/2007    Current Outpatient Medications:  .  albuterol (PROVENTIL HFA;VENTOLIN HFA) 108 (90 BASE) MCG/ACT inhaler, Inhale 1-2 puffs into the lungs every 6 (six) hours as needed for wheezing or shortness of breath., Disp: 1 Inhaler, Rfl: 0 .  amiodarone (PACERONE) 200 MG tablet, Take 1 tablet (200 mg total) by mouth daily., Disp: 90 tablet, Rfl: 3 .  carvedilol (COREG) 3.125 MG tablet, Take 1 tablet (3.125 mg total) by mouth 2 (two) times daily with a meal., Disp: 60 tablet, Rfl: 5 .  cetirizine (ZYRTEC) 5 MG tablet, Take 1 tablet (5 mg total) by mouth daily for 10 days., Disp: 10 tablet, Rfl: 0 .  doxycycline (VIBRAMYCIN) 100 MG capsule, Take 1 capsule (100 mg total) by mouth 2 (two) times daily for 7 days., Disp: 14 capsule, Rfl: 0 .  isosorbide-hydrALAZINE (BIDIL) 20-37.5 MG tablet, Take 2 tablets by mouth 3  (three) times daily., Disp: 180 tablet, Rfl: 3 .  losartan (COZAAR) 25 MG tablet, Take 1 tablet (25 mg total) by mouth daily., Disp: 30 tablet, Rfl: 5 .  acetaminophen (TYLENOL) 500 MG tablet, Take 500-1,000 mg by mouth every 6 (six) hours as needed (for pain or headaches)., Disp: , Rfl:  .  albuterol (PROVENTIL) (2.5 MG/3ML) 0.083% nebulizer solution, Take 3 mLs (2.5 mg total) by nebulization every 6 (six) hours as needed for wheezing or shortness of breath., Disp: 75 mL, Rfl: 12 .  benzonatate (TESSALON) 100 MG capsule, Take 2 capsules (200 mg total) by mouth every 8 (eight) hours for 5 days., Disp: 30 capsule, Rfl: 0 .  fluticasone (FLONASE) 50 MCG/ACT nasal spray, Place 2 sprays into both nostrils daily for 5 days., Disp: 1 g, Rfl: 0 .  ipratropium (ATROVENT) 0.02 % nebulizer solution, Take 2.5 mLs (0.5 mg total) by nebulization 4 (four) times daily., Disp: 75 mL, Rfl: 12 .  polyethylene glycol powder (GLYCOLAX/MIRALAX) powder, Take 17 g by mouth daily as needed for mild constipation., Disp: , Rfl:  Allergies  Allergen Reactions  . Lisinopril Swelling    Facial and tongue swelling 10/2012  . Penicillins Other (See Comments)    "Stomach pains" Has patient had a PCN reaction causing immediate rash, facial/tongue/throat swelling, SOB or lightheadedness with hypotension: Unk Has patient had a PCN reaction causing severe rash involving mucus membranes or skin necrosis: Unk Has patient had a PCN reaction that required hospitalization: Unk Has patient had a PCN reaction occurring within the  last 10 years: No If all of the above answers are "NO", then may proceed with Cephalosporin use.      Social History   Socioeconomic History  . Marital status: Single    Spouse name: Not on file  . Number of children: Not on file  . Years of education: Not on file  . Highest education level: Not on file  Social Needs  . Financial resource strain: Not on file  . Food insecurity - worry: Not on file  .  Food insecurity - inability: Not on file  . Transportation needs - medical: Not on file  . Transportation needs - non-medical: Not on file  Occupational History  . Occupation: "it don't work for me", on disability  Tobacco Use  . Smoking status: Former Smoker    Packs/day: 0.10    Years: 45.00    Pack years: 4.50    Last attempt to quit: 2012    Years since quitting: 7.1  . Smokeless tobacco: Never Used  Substance and Sexual Activity  . Alcohol use: No    Frequency: Never    Comment: Pt states no etoh since April 2014  . Drug use: No    Comment: Prior use of crack cocaine, quit 2012  . Sexual activity: No  Other Topics Concern  . Not on file  Social History Narrative  . Not on file    Physical Exam  Pulmonary/Chest: No respiratory distress. He has no wheezes. He has no rales.  Congestion right lower  Abdominal: He exhibits no distension.  Musculoskeletal: He exhibits no edema.  Skin: Skin is warm and dry. He is not diaphoretic.        Future Appointments  Date Time Provider [place]  09/30/2017 11:00 AM [facility] ECHO 1-BUZZ [facility]  09/30/2017 11:40 AM Bensimhon, Shaune Pascal, MD [facility] None    ATF pt CAO x4 sitting in the living room with no complaints. He went to the ED a couple of nights ago because he thought that he had a sinus infection.  Pt was given zytek and an antibiotic for the infection which he states work.  Pt stated that he was told that using the albuterol inhaler too often affected both his BP and RR and to stop using it so often. Pt was taken off of lasix this week per [facility] at the heart failure clinic.  Pt denies sob, chest pain and dizziness today.  He does have some congestion in the lower right lung. Pt took the pills out of his box prior to my arrival.  rx bottles verified and pill box refilled.    BP 134/84 (BP Location: Left Arm, Patient Position: Sitting, Cuff Size: Normal)   Pulse 62   Resp 16   Wt 159 lb 12.8 oz (72.5 kg)   SpO2 98%    BMI 22.29 kg/m   **rx called in: Losartan bidil  Weight yesterday-158 Last visit weight-147    Evangeline Utley, EMT Paramedic 09/02/2017    ACTION: Home visit completed

## 2017-09-03 ENCOUNTER — Telehealth (HOSPITAL_COMMUNITY): Payer: Self-pay | Admitting: *Deleted

## 2017-09-03 ENCOUNTER — Telehealth (HOSPITAL_COMMUNITY): Payer: Self-pay

## 2017-09-03 MED ORDER — FUROSEMIDE 20 MG PO TABS: ORAL_TABLET | ORAL | 3 refills | Status: DC

## 2017-09-03 NOTE — Telephone Encounter (Signed)
Jasmine from the heart failure clinic left the message that pt was advised to take 40mg  lasix for two days then stop.  This is due to his weight gain over the week.  Pt stated that he feels great but he understands the instructions.

## 2017-09-03 NOTE — Telephone Encounter (Signed)
Dee called to report pts weight gain of 12lbs in week. Pt was taken off of lasix on 08/28/17. Per Jonni Sanger patient can take Lasix 40mg  for two days and to call if his weight does not go down. Patient was recently seen in the ED and patient was likely given fluids.Lasix needs to remain PRN. Left Dee a detailed message.

## 2017-09-09 ENCOUNTER — Encounter (HOSPITAL_COMMUNITY): Payer: Self-pay | Admitting: Emergency Medicine

## 2017-09-09 ENCOUNTER — Other Ambulatory Visit: Payer: Self-pay

## 2017-09-09 ENCOUNTER — Emergency Department (HOSPITAL_COMMUNITY)
Admission: EM | Admit: 2017-09-09 | Discharge: 2017-09-09 | Disposition: A | Discharge status: Home / Self Care | Payer: Medicaid Other | Attending: Emergency Medicine | Admitting: Emergency Medicine

## 2017-09-09 ENCOUNTER — Other Ambulatory Visit (HOSPITAL_COMMUNITY): Payer: Self-pay

## 2017-09-09 ENCOUNTER — Emergency Department (HOSPITAL_COMMUNITY): Payer: Medicaid Other

## 2017-09-09 DIAGNOSIS — Z87891 Personal history of nicotine dependence: Secondary | ICD-10-CM | POA: Diagnosis not present

## 2017-09-09 DIAGNOSIS — Z79899 Other long term (current) drug therapy: Secondary | ICD-10-CM | POA: Insufficient documentation

## 2017-09-09 DIAGNOSIS — N182 Chronic kidney disease, stage 2 (mild): Secondary | ICD-10-CM | POA: Insufficient documentation

## 2017-09-09 DIAGNOSIS — I130 Hypertensive heart and chronic kidney disease with heart failure and stage 1 through stage 4 chronic kidney disease, or unspecified chronic kidney disease: Secondary | ICD-10-CM | POA: Diagnosis not present

## 2017-09-09 DIAGNOSIS — Z789 Other specified health status: Secondary | ICD-10-CM | POA: Insufficient documentation

## 2017-09-09 DIAGNOSIS — I5022 Chronic systolic (congestive) heart failure: Secondary | ICD-10-CM | POA: Diagnosis not present

## 2017-09-09 DIAGNOSIS — R062 Wheezing: Secondary | ICD-10-CM | POA: Diagnosis not present

## 2017-09-09 DIAGNOSIS — I493 Ventricular premature depolarization: Secondary | ICD-10-CM | POA: Diagnosis not present

## 2017-09-09 DIAGNOSIS — R072 Precordial pain: Secondary | ICD-10-CM

## 2017-09-09 DIAGNOSIS — I5043 Acute on chronic combined systolic (congestive) and diastolic (congestive) heart failure: Secondary | ICD-10-CM | POA: Insufficient documentation

## 2017-09-09 LAB — CBC
HEMATOCRIT: 38.5 % — AB (ref 39.0–52.0)
Hemoglobin: 12.9 g/dL — ABNORMAL LOW (ref 13.0–17.0)
MCH: 28.7 pg (ref 26.0–34.0)
MCHC: 33.5 g/dL (ref 30.0–36.0)
MCV: 85.6 fL (ref 78.0–100.0)
Platelets: 251 10*3/uL (ref 150–400)
RBC: 4.5 MIL/uL (ref 4.22–5.81)
RDW: 16.1 % — AB (ref 11.5–15.5)
WBC: 6.2 10*3/uL (ref 4.0–10.5)

## 2017-09-09 LAB — BASIC METABOLIC PANEL
Anion gap: 11 (ref 5–15)
BUN: 13 mg/dL (ref 6–20)
CHLORIDE: 107 mmol/L (ref 101–111)
CO2: 20 mmol/L — ABNORMAL LOW (ref 22–32)
Calcium: 8.9 mg/dL (ref 8.9–10.3)
Creatinine, Ser: 1.43 mg/dL — ABNORMAL HIGH (ref 0.61–1.24)
GFR calc Af Amer: 58 mL/min — ABNORMAL LOW (ref >60)
GFR calc non Af Amer: 50 mL/min — ABNORMAL LOW (ref >60)
GLUCOSE: 100 mg/dL — AB (ref 65–99)
POTASSIUM: 4.3 mmol/L (ref 3.5–5.1)
SODIUM: 138 mmol/L (ref 135–145)

## 2017-09-09 LAB — I-STAT TROPONIN, ED: Troponin i, poc: 0.01 ng/mL (ref 0.00–0.08)

## 2017-09-09 MED ORDER — ALBUTEROL SULFATE (2.5 MG/3ML) 0.083% IN NEBU
5.0000 mg | INHALATION_SOLUTION | Freq: Once | RESPIRATORY_TRACT | Status: AC
  Administered 2017-09-09: 5 mg via RESPIRATORY_TRACT
  Filled 2017-09-09: qty 6

## 2017-09-09 MED ORDER — IPRATROPIUM BROMIDE 0.02 % IN SOLN
0.5000 mg | Freq: Once | RESPIRATORY_TRACT | Status: AC
  Administered 2017-09-09: 0.5 mg via RESPIRATORY_TRACT
  Filled 2017-09-09: qty 2.5

## 2017-09-09 NOTE — ED Triage Notes (Signed)
Pt BIB EMS for cough, n/v, and CP. Pt visited community paramedic program for symptoms; bradycardia at 36 on their assessment. Pt received 4 mg zofran PTA; HTN 187/74. Resp e/u, A&Ox4, NAD at this time.

## 2017-09-09 NOTE — ED Provider Notes (Signed)
[facility] EMERGENCY DEPARTMENT Provider Note   CSN: [id number] Arrival date & time: 09/09/17  1234     History   Chief Complaint Chief Complaint  Patient presents with  . Bradycardia  . Chest Pain    HPI Joe Garcia is a 67 y.o. male with a h/o of asthma, solitary kidney, former crack cocaine abuse,  HTN, HLD, alcoholism, CKD stage II, and CHF since to the emergency department with a chief complaint of bradycardia.  The patient reports that he was visited by his home health nurse this morning who recorded his pulse at 34 bpm advised him to come to the emergency department.  He is unsure of how the nurse took his pulse.  He also endorses 4 episodes of NBNB emesis this morning after taking his medications on an empty stomach.  He also endorses sudden onset dyspnea that began last night with wheezing and a HA that began this morning.  He treated his symptoms with 3 tablets of extra strength Tylenol without improvement.  He also endorses chest pain that feels like his acid reflux that is present over the mid chest, which has improved since onset.   He denies fever, chills, diarrhea, leg swelling, palpitations, diaphoresis, numbness, weakness, abdominal pain, rash, dizziness, lightheadedness, or visual changes.  The patient is a poor historian.  The history is provided by the patient. No language interpreter was used.    Past Medical History:  Diagnosis Date  . Alcoholism ([place])   . Arthritis    hips, L shoulder  . Asthma   . CHF (congestive heart failure) ([place])   . Depression   . Family history of anesthesia complication   . Hyperlipidemia   . Hypertension     Patient Active Problem List   Diagnosis Date Noted  . Malnutrition of moderate degree 07/16/2017  . Asthma exacerbation 07/14/2017  . Acute respiratory distress 07/14/2017  . 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 Acute on chronic combined systolic and diastolic CHF (congestive  heart failure) ([place]) 07/14/2017  . Solitary kidney, congenital 06/23/2017  . AKI (acute kidney injury) ([place]) 06/17/2017  . Epigastric pain 06/17/2017  . Elevated troponin 06/17/2017  . Nonischemic cardiomyopathy ([place])   . Multifocal PVCs   . Chronic combined systolic (congestive) and diastolic (congestive) heart failure ([place]) 06/11/2017  . Influenza A 07/17/2013  . Influenza 07/16/2013  . Fever 07/16/2013  . Tachycardia 07/16/2013  . Alcohol abuse 09/25/2012  . Alcohol dependence ([place]) 09/22/2012  . Essential hypertension 03/22/2007  . DEGENERATIVE DISC DISEASE 03/22/2007  . 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 RIGHT/LEFT HEART CATH AND CORONARY ANGIOGRAPHY N/A 06/12/2017   Procedure: RIGHT/LEFT HEART CATH AND CORONARY ANGIOGRAPHY;  Surgeon: Jolaine Artist, MD;  Location: [facility];  Service: Cardiovascular;  Laterality: N/A;       Home Medications    Prior to Admission medications   Medication Sig Start Date End Date Taking? Authorizing Provider  acetaminophen (TYLENOL) 500 MG tablet Take 500-1,000 mg by mouth every 6 (six) hours as needed (for pain or headaches).   Yes [provider]  albuterol (PROVENTIL HFA;VENTOLIN HFA) 108 (90 BASE) MCG/ACT inhaler Inhale 1-2 puffs into the lungs every 6 (six) hours as needed for wheezing or shortness of breath. 07/12/15  Yes Barrett, Lahoma Crocker, PA-C  albuterol (PROVENTIL) (2.5 MG/3ML) 0.083% nebulizer solution Take 3 mLs (2.5 mg total) by nebulization every 6 (six) hours as  needed for wheezing or shortness of breath. 10/16/15  Yes Kirichenko, Tatyana, PA-C  amiodarone (PACERONE) 200 MG tablet Take 1 tablet (200 mg total) by mouth daily. 07/13/17  Yes Clegg, Amy D, NP  carvedilol (COREG) 3.125 MG tablet Take 1 tablet (3.125 mg total) by mouth 2 (two) times daily with a meal. 07/24/17  Yes Clegg, Amy D, NP  cetirizine (ZYRTEC) 5 MG tablet Take 1 tablet (5 mg total) by mouth  daily for 10 days. 08/28/17 09/09/17 Yes Wieters, Hallie C, PA-C  fluticasone (FLONASE) 50 MCG/ACT nasal spray Place 2 sprays into both nostrils daily for 5 days. Patient taking differently: Place 2 sprays into both nostrils daily as needed for allergies.  08/28/17 09/09/17 Yes Wieters, Hallie C, PA-C  furosemide (LASIX) 20 MG tablet Take only as needed as directed by the CHF clinic. Patient taking differently: Take 20 mg by mouth daily as needed for fluid. Take only as needed as directed by the CHF clinic. 09/03/17  Yes Shirley Friar, PA-C  ipratropium (ATROVENT) 0.02 % nebulizer solution Take 2.5 mLs (0.5 mg total) by nebulization 4 (four) times daily. 07/26/17  Yes Pisciotta, Elmyra Ricks, PA-C  isosorbide-hydrALAZINE (BIDIL) 20-37.5 MG tablet Take 2 tablets by mouth 3 (three) times daily. 08/07/17  Yes Clegg, Amy D, NP  losartan (COZAAR) 25 MG tablet Take 1 tablet (25 mg total) by mouth daily. 08/13/17  Yes Bensimhon, Shaune Pascal, MD  polyethylene glycol powder (GLYCOLAX/MIRALAX) powder Take 17 g by mouth daily as needed for mild constipation.   Yes [provider]    Family History Family History  Problem Relation Age of Onset  . Heart disease Father   . Heart disease Daughter        "fluid around heart"     Social History Social History   Tobacco Use  . Smoking status: Former Smoker    Packs/day: 0.10    Years: 45.00    Pack years: 4.50    Last attempt to quit: 2012    Years since quitting: 7.1  . Smokeless tobacco: Never Used  Substance Use Topics  . Alcohol use: No    Frequency: Never    Comment: Pt states no etoh since April 2014  . Drug use: No    Comment: Prior use of crack cocaine, quit 2012     Allergies   Lisinopril and Penicillins   Review of Systems Review of Systems  Constitutional: Negative for activity change, appetite change and fever.  HENT: Negative for sore throat.   Eyes: Negative for visual disturbance.  Respiratory: Positive for wheezing.  Negative for shortness of breath.   Cardiovascular: Positive for chest pain.  Gastrointestinal: Positive for nausea and vomiting. Negative for abdominal pain.  Genitourinary: Negative for dysuria and flank pain.  Musculoskeletal: Negative for back pain, neck pain and neck stiffness.  Skin: Negative for rash.  Allergic/Immunologic: Negative for immunocompromised state.  Neurological: Positive for headaches. Negative for dizziness, syncope and weakness.  Psychiatric/Behavioral: Negative for confusion.     Physical Exam Updated Vital Signs BP (!) 167/82 (BP Location: Right Arm)   Pulse (!) 57   Temp 98.6 F (37 C) (Oral)   Resp 16   Ht 5\' 11"  (1.803 m)   Wt 69.4 kg (153 lb)   SpO2 96%   BMI 21.34 kg/m   Physical Exam  Constitutional: He is oriented to person, place, and time. He appears well-developed. No distress.  HENT:  Head: Normocephalic.  Eyes: Conjunctivae and EOM are normal. Pupils  are equal, round, and reactive to light.  Neck: Normal range of motion. Neck supple. No JVD present.  Cardiovascular: Normal rate, regular rhythm, normal heart sounds and intact distal pulses. Exam reveals no gallop and no friction rub.  No murmur heard. Pulmonary/Chest: Effort normal. No stridor. No respiratory distress. He has wheezes. He has no rales. He exhibits no tenderness.  Faint inspiratory and expiratory wheezes heard bilaterally in all fields.  Abdominal: Soft. He exhibits no distension and no mass. There is no tenderness. There is no rebound and no guarding. No hernia.  Musculoskeletal: Normal range of motion. He exhibits no edema, tenderness or deformity.  No lower extremity edema.  Neurological: He is alert and oriented to person, place, and time.  Skin: Skin is warm and dry. Capillary refill takes less than 2 seconds. No rash noted. He is not diaphoretic. No erythema. No pallor.  Psychiatric: His behavior is normal.  Nursing note and vitals reviewed.    ED Treatments /  Results  Labs (all labs ordered are listed, but only abnormal results are displayed) Labs Reviewed  BASIC METABOLIC PANEL - Abnormal; Notable for the following components:      Result Value   CO2 20 (*)    Glucose, Bld 100 (*)    Creatinine, Ser 1.43 (*)    GFR calc non Af Amer 50 (*)    GFR calc Af Amer 58 (*)    All other components within normal limits  CBC - Abnormal; Notable for the following components:   Hemoglobin 12.9 (*)    HCT 38.5 (*)    RDW 16.1 (*)    All other components within normal limits  I-STAT TROPONIN, ED    EKG  EKG Interpretation None       Radiology Dg Chest 2 View  Result Date: 09/09/2017 CLINICAL DATA:  Central chest pain. EXAM: CHEST  2 VIEW COMPARISON:  08/30/2017. FINDINGS: Normal cardiac silhouette. Clear lung fields. No acute bony abnormality. Mild sclerosis of the medial RIGHT clavicle, could represent old injury. Chronic thoracic spondylosis. IMPRESSION: No active cardiopulmonary disease. Stable appearance from priors. Electronically Signed   By: Staci Righter M.D.   On: 09/09/2017 13:37    Procedures Procedures (including critical care time)  Medications Ordered in ED Medications  albuterol (PROVENTIL) (2.5 MG/3ML) 0.083% nebulizer solution 5 mg (5 mg Nebulization Given 09/09/17 1448)  ipratropium (ATROVENT) nebulizer solution 0.5 mg (0.5 mg Nebulization Given 09/09/17 1448)     Initial Impression / Assessment and Plan / ED Course  I have reviewed the triage vital signs and the nursing notes.  Pertinent labs & imaging results that were available during my care of the patient were reviewed by me and considered in my medical decision making (see chart for details).     67 year old male with a h/o of asthma, solitary kidney, former crack cocaine abuse,  HTN, HLD, alcoholism, CKD stage II, and CHF presenting with bradycardia, nausea, vomiting, wheezing, headache, and chest pain.  Ventricular heart rate 67 on EKG.  No acute EKG changes.   Troponin is negative.  BMP and CBC appear at the patient's baseline.  Patient was seen by the heart failure team who had a very low suspicion for ACS and recommend close follow-up in the HF clinic and to continue his home medications.  HF team also recommends that pulse ox is not accurate due to PVCs to measure the patient's pulse the patient and his family was educated on how to properly record the patient's  heart rate and information was provided on his discharge paperwork.  Patient was ambulated on pulse ox by me prior to discharge after a nebulizer treatment.  Lungs were clear to auscultation after nebulizer treatment.  SaO2 99-100% with ambulation.  The patient has no complaints and reports that all pain, nausea, vomiting and other symptoms have resolved at this time.  Discussed not taking his medications on an empty stomach.  Discussed that the heart failure team will reach out to him regarding follow-up.  Strict return precautions given.  No acute distress.  The patient is safe for discharge at this time.  Final Clinical Impressions(s) / ED Diagnoses   Final diagnoses:  Wheezing  Normal pulse    ED Discharge Orders    None       Joanne Gavel, PA-C 09/09/17 1840    Charlesetta Shanks, MD 09/11/17 [phone]

## 2017-09-09 NOTE — Consult Note (Signed)
Advanced Heart Failure Team Consult Note   Primary Physician: [facility], [facility] PCP-Cardiologist:  No primary care provider on file.  Reason for Consultation: SOB/CP  HPI:    Joe Garcia is seen today for evaluation of SOB/CP at the request of Mia McDonald PA-C in the ED.   Joe Garcia is a 67 y.o. male with PMH of systolic CHF due to NICM EF 30-35%, HTN, HLD, Asthma, and solitary kidney. Former crack cocaine abuse.   Last seen in HF clinic 08/28/17. Had recently completed Levaquin for URI symptoms, so no med titration.   Pt follows with paramedicine in the community. Had appointment this morning and pt complained of not feeling well. Had taken medicine on empty stomach.  Pulse ox placed with normal O2 sat but pulse in 30s. Confirmatory EKG with Rates in 60s, and frequebnt PVCs. Pt continued to not feel well so came into ED for further evaluation.   Patient feeling much better at this time. States he was feeling "lazy" this am and took his meds on an empty stomach and laid back down. Got up some-time later with N/V. Complains of Mid to Left sided chest pain that feels like his usual reflux.  Improved after vomiting, but still persists. Continuing to improve.  Pertinent labs include Cr 1.43, K 4.3, Trop 0.01, WBC 6.2, and Troponin 12.9.   CXR with no active cardiopulmonary disease.  ECHO 05/06/2017 EF 30-35%, Grade 1 DD.  Review of Systems: [y] = yes, [ ]  = no   General: Weight gain [ ] ; Weight loss [ ] ; Anorexia [ ] ; Fatigue Blue.Reese ]; Fever [ ] ; Chills [ ] ; Weakness Blue.Reese ]  Cardiac: Chest pain/pressure Blue.Reese ]; Resting SOB [ ] ; Exertional SOB [ ] ; Orthopnea [ ] ; Pedal Edema [ ] ; Palpitations [ ] ; Syncope [ ] ; Presyncope [ ] ; Paroxysmal nocturnal dyspnea[ ]   Pulmonary: Cough [ ] ; Wheezing[ ] ; Hemoptysis[ ] ; Sputum [ ] ; Snoring [ ]   GI: Vomiting[y ]; Dysphagia[ ] ; Melena[ ] ; Hematochezia [ ] ; Heartburn[ ] ; Abdominal pain [ ] ; Constipation [ ] ; Diarrhea [ ] ; BRBPR [  ]  GU: Hematuria[ ] ; Dysuria [ ] ; Nocturia[ ]   Vascular: Pain in legs with walking [ ] ; Pain in feet with lying flat [ ] ; Non-healing sores [ ] ; Stroke [ ] ; TIA [ ] ; Slurred speech [ ] ;  Neuro: Headaches[ ] ; Vertigo[ ] ; Seizures[ ] ; Paresthesias[ ] ;Blurred vision [ ] ; Diplopia [ ] ; Vision changes [ ]   Ortho/Skin: Arthritis [ y]; Joint pain Blue.Reese ]; Muscle pain [ ] ; Joint swelling [ ] ; Back Pain [ ] ; Rash [ ]   Psych: Depression[ ] ; Anxiety[ ]   Heme: Bleeding problems [ ] ; Clotting disorders [ ] ; Anemia [ ]   Endocrine: Diabetes [ ] ; Thyroid dysfunction[ ]   Home Medications Prior to Admission medications   Medication Sig Start Date End Date Taking? Authorizing Provider  acetaminophen (TYLENOL) 500 MG tablet Take 500-1,000 mg by mouth every 6 (six) hours as needed (for pain or headaches).    [provider]  albuterol (PROVENTIL HFA;VENTOLIN HFA) 108 (90 BASE) MCG/ACT inhaler Inhale 1-2 puffs into the lungs every 6 (six) hours as needed for wheezing or shortness of breath. 07/12/15   Barrett, Lahoma Crocker, PA-C  albuterol (PROVENTIL) (2.5 MG/3ML) 0.083% nebulizer solution Take 3 mLs (2.5 mg total) by nebulization every 6 (six) hours as needed for wheezing or shortness of breath. 10/16/15   Kirichenko, Lahoma Rocker, PA-C  amiodarone (PACERONE) 200 MG tablet Take 1 tablet (200  mg total) by mouth daily. 07/13/17   Clegg, Amy D, NP  carvedilol (COREG) 3.125 MG tablet Take 1 tablet (3.125 mg total) by mouth 2 (two) times daily with a meal. 07/24/17   Clegg, Amy D, NP  cetirizine (ZYRTEC) 5 MG tablet Take 1 tablet (5 mg total) by mouth daily for 10 days. 08/28/17 09/07/17  Wieters, Hallie C, PA-C  fluticasone (FLONASE) 50 MCG/ACT nasal spray Place 2 sprays into both nostrils daily for 5 days. 08/28/17 09/02/17  Wieters, Hallie C, PA-C  furosemide (LASIX) 20 MG tablet Take only as needed as directed by the CHF clinic. 09/03/17   Shirley Friar, PA-C  ipratropium (ATROVENT) 0.02 % nebulizer solution Take 2.5 mLs  (0.5 mg total) by nebulization 4 (four) times daily. 07/26/17   Pisciotta, Elmyra Ricks, PA-C  isosorbide-hydrALAZINE (BIDIL) 20-37.5 MG tablet Take 2 tablets by mouth 3 (three) times daily. 08/07/17   Clegg, Amy D, NP  losartan (COZAAR) 25 MG tablet Take 1 tablet (25 mg total) by mouth daily. 08/13/17   Bensimhon, Shaune Pascal, MD  polyethylene glycol powder (GLYCOLAX/MIRALAX) powder Take 17 g by mouth daily as needed for mild constipation.    [provider]    Past Medical History: Past Medical History:  Diagnosis Date  . Alcoholism ([place])   . Arthritis    hips, L shoulder  . Asthma   . CHF (congestive heart failure) ([place])   . Depression   . Family history of anesthesia complication   . Hyperlipidemia   . Hypertension     Past Surgical History: Past Surgical History:  Procedure Laterality Date  . RIGHT/LEFT HEART CATH AND CORONARY ANGIOGRAPHY N/A 06/12/2017   Procedure: RIGHT/LEFT HEART CATH AND CORONARY ANGIOGRAPHY;  Surgeon: Jolaine Artist, MD;  Location: [facility];  Service: Cardiovascular;  Laterality: N/A;    Family History: Family History  Problem Relation Age of Onset  . Heart disease Father   . Heart disease Daughter        "fluid around heart"     Social History: Social History   Socioeconomic History  . Marital status: Single    Spouse name: None  . Number of children: None  . Years of education: None  . Highest education level: None  Social Needs  . Financial resource strain: None  . Food insecurity - worry: None  . Food insecurity - inability: None  . Transportation needs - medical: None  . Transportation needs - non-medical: None  Occupational History  . Occupation: "it don't work for me", on disability  Tobacco Use  . Smoking status: Former Smoker    Packs/day: 0.10    Years: 45.00    Pack years: 4.50    Last attempt to quit: 2012    Years since quitting: 7.1  . Smokeless tobacco: Never Used  Substance and Sexual Activity  .  Alcohol use: No    Frequency: Never    Comment: Pt states no etoh since April 2014  . Drug use: No    Comment: Prior use of crack cocaine, quit 2012  . Sexual activity: No  Other Topics Concern  . None  Social History Narrative  . None    Allergies:  Allergies  Allergen Reactions  . Lisinopril Swelling    Facial and tongue swelling 10/2012  . Penicillins Other (See Comments)    "Stomach pains" Has patient had a PCN reaction causing immediate rash, facial/tongue/throat swelling, SOB or lightheadedness with hypotension: Unk Has patient had a PCN  reaction causing severe rash involving mucus membranes or skin necrosis: Unk Has patient had a PCN reaction that required hospitalization: Unk Has patient had a PCN reaction occurring within the last 10 years: No If all of the above answers are "NO", then may proceed with Cephalosporin use.     Objective:    Vital Signs:   Temp:  [98.6 F (37 C)] 98.6 F (37 C) (02/13 1242) Pulse Rate:  [34-36] 34 (02/13 1242) Resp:  [16-20] 20 (02/13 1242) BP: (188-193)/(82-89) 193/89 (02/13 1242) SpO2:  [97 %-98 %] 98 % (02/13 1242) Weight:  [153 lb (69.4 kg)-153 lb 6.4 oz (69.6 kg)] 153 lb (69.4 kg) (02/13 1246)    Weight change: Filed Weights   09/09/17 1246  Weight: 153 lb (69.4 kg)    Intake/Output:  No intake or output data in the 24 hours ending 09/09/17 1341    Physical Exam    General:  Well appearing. No resp difficulty HEENT: normal Neck: supple. JVP not elevated. Carotids 2+ bilat; no bruits. No lymphadenopathy or thyromegaly appreciated. Cor: PMI nondisplaced. Regular but brady, occasional ectopy. No M/G/R. Lungs: Diminished throughout with ?scant wheeze that clears with deep breathing. (Possibly upper respiratory sounds)  Abdomen: soft, nontender, nondistended. No hepatosplenomegaly. No bruits or masses. Good bowel sounds. Extremities: no cyanosis, clubbing, rash, edema Neuro: alert & orientedx3, cranial nerves grossly  intact. moves all 4 extremities w/o difficulty. Affect pleasant   Telemetry   NSR 60-70s with occasional PVCs (Averaging 0-1 a minute at rest for the most part on tele), personally reviewed. PVCs increase with activity.   EKG    NSR 60s with PVCs, personally reviewed.   Labs   Basic Metabolic Panel: No results for input(s): NA, K, CL, CO2, GLUCOSE, BUN, CREATININE, CALCIUM, MG, PHOS in the last 168 hours.  Liver Function Tests: No results for input(s): AST, ALT, ALKPHOS, BILITOT, PROT, ALBUMIN in the last 168 hours. No results for input(s): LIPASE, AMYLASE in the last 168 hours. No results for input(s): AMMONIA in the last 168 hours.  CBC: Recent Labs  Lab 09/09/17 1244  WBC 6.2  HGB 12.9*  HCT 38.5*  MCV 85.6  PLT 251    Cardiac Enzymes: No results for input(s): CKTOTAL, CKMB, CKMBINDEX, TROPONINI in the last 168 hours.  BNP: BNP (last 3 results) Recent Labs    06/10/17 1710 06/17/17 1239 07/14/17 0444  BNP 298.6* 226.0* 434.2*    ProBNP (last 3 results) No results for input(s): PROBNP in the last 8760 hours.   CBG: No results for input(s): GLUCAP in the last 168 hours.  Coagulation Studies: No results for input(s): LABPROT, INR in the last 72 hours.   Imaging   Dg Chest 2 View  Result Date: 09/09/2017 CLINICAL DATA:  Central chest pain. EXAM: CHEST  2 VIEW COMPARISON:  08/30/2017. FINDINGS: Normal cardiac silhouette. Clear lung fields. No acute bony abnormality. Mild sclerosis of the medial RIGHT clavicle, could represent old injury. Chronic thoracic spondylosis. IMPRESSION: No active cardiopulmonary disease. Stable appearance from priors. Electronically Signed   By: Staci Righter M.D.   On: 09/09/2017 13:37      Medications:     Current Medications:    Infusions:      Patient Profile   Joe Garcia is a 67 y.o. 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 Former crack cocaine abuse.    Presented [facility]17 with SOB and CP in setting of N/V.  Assessment/Plan   1. N/V - Solitary episode after taking meds on empty stomach. Pt states accompanying chest pain similar to reflux.   Troponin negative and norm coronaries in 05/2017 so very low suspicion for ACS.  - No further. Gradually feeling better.  2. Chronic Systolic Heart Failure. ECHO 05/06/2017 EF 30-35%. NICM. 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 Well compensated hemodynamics.  - Volume status stable to low. No need to resume lasix.  - Continue coreg 3.125 mg BID. Follow rates closely. Pulse Ox not accurate due to PVCs, but have much improved even since arrival to ED with no intervention. - Continue bidil to 2 tab twice a day.  - Continue losartan 25 mg daily. BMET today stable.  - He has refused entresto due to solitary kidney.  - Can consider amlodipine if pressures remain elevated. 3. PVCs - PVCs on EKG today, but have decreased by tele - Pt very anxious this morning, and clear improvement of HR and amount of PVCs after work up in ED unremarkable. ? Anxiety component.  - Continue amio to 200 mg daily for now.  - If has recurrence, can order 48 hr holter monitor to further quantify PVCs.  4. HTN - Poorly controlled - Outlier BP of 190 this am, Bp checks immediately before and after in 130-150s.   - No change to meds today. Close follow up in HF clinic.  Elevated today but at home SBP 130-150 at home.  5. Solitary Kidney, Congenital - Creatinine stable today.  6. Illiteracy Can not read pill bottles. Continue paramedicine.  7. Asthma - Per PCP. Scant wheeze on exam, more pronounced when breathing shallow.  - ? Acute exacerbation. Treatment per ED.  Non-specific symptoms today with likely anxiety component.  Labs unremarkable.  OK per HF team for discharge with close follow up. Will arrange.   Medication concerns reviewed with patient and pharmacy team. Barriers identified:  None at this time.   Length of Stay: 0  Annamaria Helling  09/09/2017, 1:41 PM  Advanced Heart Failure Team Pager [phone] (M-F; 7a - 4p)  Please contact [facility] for night-coverage after hours (4p -7a ) and weekends on [email or web address]  Patient seen and examined with the above-signed Advanced Practice Provider and/or Housestaff. I personally reviewed laboratory data, imaging studies and relevant notes. I independently examined the patient and formulated the important aspects of the plan. I have edited the note to reflect any of my changes or salient points. I have personally discussed the plan with the patient and/or family.  67 y/o male with chronic systolic HF due to NICM presents to ER with nausea and vomiting, chest pain and bradycardia. Currently feels better. No fevers or chills. No Edema, orthopnea or PND.  On exam looks well.  JVP flat Cor RRR with occasional ectopy Lungs with decreased BS no crackles. Mild upper airway wheeze Ab soft NT/ND Ext: no c/c/e  ECG with NSR and PVCs. CXR ok (Personally reviewed)  Trop 0.01  He is stable from HF perspective. Doubt CP is ischemic. Now resolved. Bradycardia on home monitor likely due to undercounting in setting of PVCs.   He is stable for d/c from our standpoint. Will f/u in HF Clinic next week.   D/w ER team.   Glori Bickers, MD  5:28 PM

## 2017-09-09 NOTE — Discharge Instructions (Signed)
The heart failure clinic will call you to schedule a follow-up appointment.  Please make sure that when you have your pulse checked that it is not with the pulse oximeter because this can give you an incorrect reading.  If you develop new or worsening symptoms, including dizziness, lightheadedness, if you feel like he might pass out, shortness of breath, or other new concerning symptoms, please return to the emergency department for reevaluation.

## 2017-09-09 NOTE — ED Notes (Signed)
Patient transported to X-ray 

## 2017-09-09 NOTE — Progress Notes (Signed)
Paramedicine Encounter    Patient ID: Joe Garcia, male    DOB: 1951-05-14, 67 y.o.   MRN: [id number]    Patient Care Team: Medicine, [facility] as PCP - General  Patient Active Problem List   Diagnosis Date Noted  . Malnutrition of moderate degree 07/16/2017  . Asthma exacerbation 07/14/2017  . Acute respiratory distress 07/14/2017  . 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 Acute on chronic combined systolic and diastolic CHF (congestive heart failure) ([place]) 07/14/2017  . Solitary kidney, congenital 06/23/2017  . AKI (acute kidney injury) ([place]) 06/17/2017  . Epigastric pain 06/17/2017  . Elevated troponin 06/17/2017  . Nonischemic cardiomyopathy ([place])   . Multifocal PVCs   . Chronic combined systolic (congestive) and diastolic (congestive) heart failure ([place]) 06/11/2017  . Influenza A 07/17/2013  . Influenza 07/16/2013  . Fever 07/16/2013  . Tachycardia 07/16/2013  . Alcohol abuse 09/25/2012  . Alcohol dependence ([place]) 09/22/2012  . Essential hypertension 03/22/2007  . DEGENERATIVE DISC DISEASE 03/22/2007  . AVASCULAR NECROSIS 03/22/2007    Current Outpatient Medications:  .  amiodarone (PACERONE) 200 MG tablet, Take 1 tablet (200 mg total) by mouth daily., Disp: 90 tablet, Rfl: 3 .  carvedilol (COREG) 3.125 MG tablet, Take 1 tablet (3.125 mg total) by mouth 2 (two) times daily with a meal., Disp: 60 tablet, Rfl: 5 .  isosorbide-hydrALAZINE (BIDIL) 20-37.5 MG tablet, Take 2 tablets by mouth 3 (three) times daily., Disp: 180 tablet, Rfl: 3 .  losartan (COZAAR) 25 MG tablet, Take 1 tablet (25 mg total) by mouth daily., Disp: 30 tablet, Rfl: 5 .  acetaminophen (TYLENOL) 500 MG tablet, Take 500-1,000 mg by mouth every 6 (six) hours as needed (for pain or headaches)., Disp: , Rfl:  .  albuterol (PROVENTIL HFA;VENTOLIN HFA) 108 (90 BASE) MCG/ACT inhaler, Inhale 1-2 puffs into the lungs every 6 (six) hours as needed for wheezing or shortness of  breath., Disp: 1 Inhaler, Rfl: 0 .  albuterol (PROVENTIL) (2.5 MG/3ML) 0.083% nebulizer solution, Take 3 mLs (2.5 mg total) by nebulization every 6 (six) hours as needed for wheezing or shortness of breath., Disp: 75 mL, Rfl: 12 .  cetirizine (ZYRTEC) 5 MG tablet, Take 1 tablet (5 mg total) by mouth daily for 10 days., Disp: 10 tablet, Rfl: 0 .  fluticasone (FLONASE) 50 MCG/ACT nasal spray, Place 2 sprays into both nostrils daily for 5 days. (Patient taking differently: Place 2 sprays into both nostrils daily as needed for allergies. ), Disp: 1 g, Rfl: 0 .  furosemide (LASIX) 20 MG tablet, Take only as needed as directed by the CHF clinic. (Patient taking differently: Take 20 mg by mouth daily as needed for fluid. Take only as needed as directed by the CHF clinic.), Disp: 90 tablet, Rfl: 3 .  ipratropium (ATROVENT) 0.02 % nebulizer solution, Take 2.5 mLs (0.5 mg total) by nebulization 4 (four) times daily., Disp: 75 mL, Rfl: 12 .  polyethylene glycol powder (GLYCOLAX/MIRALAX) powder, Take 17 g by mouth daily as needed for mild constipation., Disp: , Rfl:  Allergies  Allergen Reactions  . Lisinopril Swelling    Facial and tongue swelling 10/2012  . Penicillins Other (See Comments)    "Stomach pains" Has patient had a PCN reaction causing immediate rash, facial/tongue/throat swelling, SOB or lightheadedness with hypotension: Unk Has patient had a PCN reaction causing severe rash involving mucus membranes or skin necrosis: Unk Has patient had a PCN reaction that required hospitalization: Unk Has patient had  a PCN reaction occurring within the last 10 years: No If all of the above answers are "NO", then may proceed with Cephalosporin use.      Social History   Socioeconomic History  . Marital status: Single    Spouse name: Not on file  . Number of children: Not on file  . Years of education: Not on file  . Highest education level: Not on file  Social Needs  . Financial resource strain: Not  on file  . Food insecurity - worry: Not on file  . Food insecurity - inability: Not on file  . Transportation needs - medical: Not on file  . Transportation needs - non-medical: Not on file  Occupational History  . Occupation: "it don't work for me", on disability  Tobacco Use  . Smoking status: Former Smoker    Packs/day: 0.10    Years: 45.00    Pack years: 4.50    Last attempt to quit: 2012    Years since quitting: 7.1  . Smokeless tobacco: Never Used  Substance and Sexual Activity  . Alcohol use: No    Frequency: Never    Comment: Pt states no etoh since April 2014  . Drug use: No    Comment: Prior use of crack cocaine, quit 2012  . Sexual activity: No  Other Topics Concern  . Not on file  Social History Narrative  . Not on file    Physical Exam  Pulmonary/Chest: No respiratory distress.  Abdominal: He exhibits no distension.  Musculoskeletal: He exhibits no edema.  Skin: Skin is warm and dry. He is not diaphoretic.        Future Appointments  Date Time Provider [place]  09/17/2017 12:00 PM [facility] PA/NP [facility] None  09/30/2017 11:00 AM MC ECHO 1-BUZZ [facility]  09/30/2017 11:40 AM Bensimhon, Shaune Pascal, MD [facility] None    ATF pt CAO x4 sitting on his couch c/o not "feeling good".  He stated that he thinks that its due to the sinus infection that he is diagnosed with a week or so ago.  Pt denies sob and chest pain.  He does have dizziness today.  He has taken all of his medications for this week.  During his assessment his HR showed 36 on the pulse oz.  12 lead obtained; sinus rhythm with LVH. While the monitor was still on his rhythm changed. He started having PVC's that weren't perfusing.  Due to his BP and 12 lead he was sent to the ED by ems.      BP (!) 188/82 (BP Location: Left Arm, Patient Position: Sitting, Cuff Size: Normal)   Pulse (!) 36   Resp 16   Wt 153 lb 6.4 oz (69.6 kg)   SpO2 97%   BMI 21.39 kg/m   Weight yesterday-153 Last visit  weight-159    Ahnaf Caponi, EMT Paramedic 09/09/2017    ACTION: Home visit completed

## 2017-09-17 ENCOUNTER — Ambulatory Visit (HOSPITAL_COMMUNITY)
Admit: 2017-09-17 | Discharge: 2017-09-17 | Disposition: A | Discharge status: Home / Self Care | Payer: Medicaid Other | Attending: Cardiology | Admitting: Cardiology

## 2017-09-17 ENCOUNTER — Encounter (HOSPITAL_COMMUNITY): Payer: Self-pay

## 2017-09-17 VITALS — BP 158/80 | HR 58 | Wt 151.0 lb

## 2017-09-17 DIAGNOSIS — I5022 Chronic systolic (congestive) heart failure: Secondary | ICD-10-CM | POA: Diagnosis present

## 2017-09-17 DIAGNOSIS — I429 Cardiomyopathy, unspecified: Secondary | ICD-10-CM | POA: Diagnosis present

## 2017-09-17 DIAGNOSIS — R0602 Shortness of breath: Secondary | ICD-10-CM | POA: Insufficient documentation

## 2017-09-17 DIAGNOSIS — I5042 Chronic combined systolic (congestive) and diastolic (congestive) heart failure: Secondary | ICD-10-CM | POA: Diagnosis not present

## 2017-09-17 DIAGNOSIS — K219 Gastro-esophageal reflux disease without esophagitis: Secondary | ICD-10-CM | POA: Insufficient documentation

## 2017-09-17 DIAGNOSIS — F1020 Alcohol dependence, uncomplicated: Secondary | ICD-10-CM | POA: Diagnosis not present

## 2017-09-17 DIAGNOSIS — N179 Acute kidney failure, unspecified: Secondary | ICD-10-CM | POA: Diagnosis not present

## 2017-09-17 DIAGNOSIS — E785 Hyperlipidemia, unspecified: Secondary | ICD-10-CM | POA: Diagnosis not present

## 2017-09-17 DIAGNOSIS — I110 Hypertensive heart disease with heart failure: Secondary | ICD-10-CM | POA: Diagnosis not present

## 2017-09-17 DIAGNOSIS — Q600 Renal agenesis, unilateral: Secondary | ICD-10-CM

## 2017-09-17 DIAGNOSIS — J45909 Unspecified asthma, uncomplicated: Secondary | ICD-10-CM | POA: Diagnosis not present

## 2017-09-17 DIAGNOSIS — Z888 Allergy status to other drugs, medicaments and biological substances status: Secondary | ICD-10-CM | POA: Insufficient documentation

## 2017-09-17 DIAGNOSIS — I10 Essential (primary) hypertension: Secondary | ICD-10-CM

## 2017-09-17 DIAGNOSIS — I493 Ventricular premature depolarization: Secondary | ICD-10-CM

## 2017-09-17 DIAGNOSIS — Z87891 Personal history of nicotine dependence: Secondary | ICD-10-CM | POA: Insufficient documentation

## 2017-09-17 DIAGNOSIS — F329 Major depressive disorder, single episode, unspecified: Secondary | ICD-10-CM | POA: Insufficient documentation

## 2017-09-17 DIAGNOSIS — F1421 Cocaine dependence, in remission: Secondary | ICD-10-CM | POA: Diagnosis not present

## 2017-09-17 DIAGNOSIS — Z880 Allergy status to penicillin: Secondary | ICD-10-CM | POA: Diagnosis not present

## 2017-09-17 DIAGNOSIS — Z79899 Other long term (current) drug therapy: Secondary | ICD-10-CM | POA: Diagnosis not present

## 2017-09-17 MED ORDER — LOSARTAN POTASSIUM 25 MG PO TABS: 25.0000 mg | ORAL_TABLET | Freq: Two times a day (BID) | ORAL | 11 refills | Status: DC

## 2017-09-17 NOTE — Progress Notes (Signed)
Advanced Heart Failure Clinic Note   Primary Cardiologist: Dr. Gwenlyn Found HF: Dr. [name]  PCP: None   HPI: Joe Garcia is a 67 y.o. 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 Former crack cocaine abuse.   Admitted 11/14 - 06/15/17 with CP and DOE. Received steroids and nebs. BNP minimally elevated on arrival. Troponin negative.  R/LHC below with normal cors and compensated hemodynamics. 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   Re-admitted 11/21-11/23/18 with cramping abdominal pain and GI symptoms. CT abdomen with no negative findings, treated as gastroenteritis. AKI noted with rise of creatinine from 1.0 -> 2.0 on admit. Improved to 1.7 at discharge. Pt states he never picked up [facility] after he left the hospital initially.   Seen in ED 09/09/17 with N/V and "chest pain". CP felt like his normal reflux. EKG with PVCs and NSR. Had been seen at home by paramedicine and told HR was in 30s, but normal in 60s on monitor. Likely undercounting due to PVCs.  Work up unremarkable.   Pt presents today for post ED follow up. Feeling much better since last week. No further N/V, CP, lightheadedness or dizziness. BP running high on arrival, but pt states he ran out of food stamps and so ate canned sausage for breakfast this am. Denies PND or orthopnea. BP at home running 130-140s. Weight stable at home in upper 140s. Taking all medication as directed. Followed by paramedicine.   ECHO 05/06/2017 EF 30-35% Grade IDD  Review of systems complete and found to be negative unless listed in HPI.    Past Medical History:  Diagnosis Date  . Alcoholism ([place])   . Arthritis    hips, L shoulder  . Asthma   . CHF (congestive heart failure) ([place])   . Depression   . Family history of anesthesia complication   . Hyperlipidemia   . Hypertension    Current Outpatient Medications  Medication Sig Dispense Refill  . acetaminophen (TYLENOL) 500 MG tablet Take  500-1,000 mg by mouth every 6 (six) hours as needed (for pain or headaches).    Marland Kitchen albuterol (PROVENTIL HFA;VENTOLIN HFA) 108 (90 BASE) MCG/ACT inhaler Inhale 1-2 puffs into the lungs every 6 (six) hours as needed for wheezing or shortness of breath. 1 Inhaler 0  . albuterol (PROVENTIL) (2.5 MG/3ML) 0.083% nebulizer solution Take 3 mLs (2.5 mg total) by nebulization every 6 (six) hours as needed for wheezing or shortness of breath. 75 mL 12  . amiodarone (PACERONE) 200 MG tablet Take 1 tablet (200 mg total) by mouth daily. 90 tablet 3  . carvedilol (COREG) 3.125 MG tablet Take 1 tablet (3.125 mg total) by mouth 2 (two) times daily with a meal. 60 tablet 5  . furosemide (LASIX) 20 MG tablet Take only as needed as directed by the CHF clinic. (Patient taking differently: Take 20 mg by mouth daily as needed for fluid. Take only as needed as directed by the CHF clinic.) 90 tablet 3  . ipratropium (ATROVENT) 0.02 % nebulizer solution Take 2.5 mLs (0.5 mg total) by nebulization 4 (four) times daily. 75 mL 12  . isosorbide-hydrALAZINE (BIDIL) 20-37.5 MG tablet Take 2 tablets by mouth 3 (three) times daily. 180 tablet 3  . losartan (COZAAR) 25 MG tablet Take 1 tablet (25 mg total) by mouth 2 (two) times daily. 60 tablet 11  . polyethylene glycol powder (GLYCOLAX/MIRALAX) powder Take 17 g by mouth daily as needed for mild constipation.  No current facility-administered medications for this encounter.    Allergies  Allergen Reactions  . Lisinopril Swelling    Facial and tongue swelling 10/2012  . Penicillins Other (See Comments)    "Stomach pains" Has patient had a PCN reaction causing immediate rash, facial/tongue/throat swelling, SOB or lightheadedness with hypotension: Unk Has patient had a PCN reaction causing severe rash involving mucus membranes or skin necrosis: Unk Has patient had a PCN reaction that required hospitalization: Unk Has patient had a PCN reaction occurring within the last 10 years:  No If all of the above answers are "NO", then may proceed with Cephalosporin use.    Social History   Socioeconomic History  . Marital status: Single    Spouse name: Not on file  . Number of children: Not on file  . Years of education: Not on file  . Highest education level: Not on file  Social Needs  . Financial resource strain: Not on file  . Food insecurity - worry: Not on file  . Food insecurity - inability: Not on file  . Transportation needs - medical: Not on file  . Transportation needs - non-medical: Not on file  Occupational History  . Occupation: "it don't work for me", on disability  Tobacco Use  . Smoking status: Former Smoker    Packs/day: 0.10    Years: 45.00    Pack years: 4.50    Last attempt to quit: 2012    Years since quitting: 7.1  . Smokeless tobacco: Never Used  Substance and Sexual Activity  . Alcohol use: No    Frequency: Never    Comment: Pt states no etoh since April 2014  . Drug use: No    Comment: Prior use of crack cocaine, quit 2012  . Sexual activity: No  Other Topics Concern  . Not on file  Social History Narrative  . Not on file    Family History  Problem Relation Age of Onset  . Heart disease Father   . Heart disease Daughter        "fluid around heart"    Vitals:   09/17/17 1140 09/17/17 1200  BP: (!) 180/82 (!) 158/80  Pulse: (!) 58   SpO2: 100%   Weight: 151 lb (68.5 kg)    Wt Readings from Last 3 Encounters:  09/17/17 151 lb (68.5 kg)  09/09/17 153 lb (69.4 kg)  09/09/17 153 lb 6.4 oz (69.6 kg)   PHYSICAL EXAM: General: Well appearing. No resp difficulty. HEENT: Normal Neck: Supple. JVP 5-6. Carotids 2+ bilat; no bruits. No thyromegaly or nodule noted. Cor: PMI nondisplaced. RRR, No M/G/R noted Lungs: CTAB, normal effort. Abdomen: Soft, non-tender, non-distended, no HSM. No bruits or masses. +BS  Extremities: No cyanosis, clubbing, or rash. R and LLE no edema.  Neuro: Alert & orientedx3, cranial nerves grossly  intact. moves all 4 extremities w/o difficulty. Affect pleasant   ASSESSMENT & PLAN: 1. Chronic Systolic Heart Failure. ECHO 05/06/2017 EF 30-35%. NICM. 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 Well compensated hemodynamics.  - NYHA II. Volume status stable. Can take lasix as needed.  - Continue coreg 3.125 mg BID.  -Continue bidil to 2 tab twice a day.  - Increase losartan to 25 mg BID. BMET one week.  - He refuses entresto due to solitary kidney.  - Reinforced fluid restriction to < 2 L daily, sodium restriction to less than 2000 mg daily, and the importance of daily weights.    2. PVCs -  Stable. Continue  amio to 200 mg daily.  3. HTN - Uncontrolled.  Goal SBP < 130. - Elevated on arrival but improved with rest. Meds as above.  4. Solitary Kidney, Congenital - Follow renal function carefully with med adjustments.  - BMET 1 week.  5. Illiteracy Can not read pill bottles. Paramedicine follows.  6. Asthma- per PCP. No wheeze on exam.   Meds and labs as above. Doing well. Keep 2 week appt with Echo on MD side. Continue paramedicine, pt cannot read his pill bottles.   Shirley Friar, PA-C 09/17/17   Greater than 50% of the 25 minute visit was spent in counseling/coordination of care regarding disease state education, salt/fluid restriction, sliding scale diuretics, and medication compliance.

## 2017-09-17 NOTE — Patient Instructions (Signed)
INCREASE Losartan to 25 mg tablet TWICE daily.  Return in 1 week for lab work.  _______________________________________________ Joe Garcia Code:  Follow up as scheduled.  Take all medication as prescribed the day of your appointment. Bring all medications with you to your appointment.  Do the following things EVERYDAY: 1) Weigh yourself in the morning before breakfast. Write it down and keep it in a log. 2) Take your medicines as prescribed 3) Eat low salt foods-Limit salt (sodium) to 2000 mg per day.  4) Stay as active as you can everyday 5) Limit all fluids for the day to less than 2 liters

## 2017-09-24 ENCOUNTER — Ambulatory Visit (HOSPITAL_COMMUNITY)
Admission: RE | Admit: 2017-09-24 | Discharge: 2017-09-24 | Disposition: A | Discharge status: Home / Self Care | Payer: Medicaid Other | Source: Ambulatory Visit | Attending: Internal Medicine | Admitting: Internal Medicine

## 2017-09-24 DIAGNOSIS — I5042 Chronic combined systolic (congestive) and diastolic (congestive) heart failure: Secondary | ICD-10-CM

## 2017-09-24 LAB — BASIC METABOLIC PANEL
Anion gap: 9 (ref 5–15)
BUN: 13 mg/dL (ref 6–20)
CALCIUM: 8.8 mg/dL — AB (ref 8.9–10.3)
CO2: 21 mmol/L — AB (ref 22–32)
CREATININE: 1.53 mg/dL — AB (ref 0.61–1.24)
Chloride: 106 mmol/L (ref 101–111)
GFR calc non Af Amer: 46 mL/min — ABNORMAL LOW (ref >60)
GFR, EST AFRICAN AMERICAN: 54 mL/min — AB (ref >60)
Glucose, Bld: 100 mg/dL — ABNORMAL HIGH (ref 65–99)
Potassium: 3.8 mmol/L (ref 3.5–5.1)
Sodium: 136 mmol/L (ref 135–145)

## 2017-09-30 ENCOUNTER — Ambulatory Visit (HOSPITAL_BASED_OUTPATIENT_CLINIC_OR_DEPARTMENT_OTHER)
Admission: RE | Admit: 2017-09-30 | Discharge: 2017-09-30 | Disposition: A | Discharge status: Home / Self Care | Payer: Medicaid Other | Source: Ambulatory Visit | Attending: Internal Medicine | Admitting: Internal Medicine

## 2017-09-30 ENCOUNTER — Ambulatory Visit (HOSPITAL_COMMUNITY)
Admission: RE | Admit: 2017-09-30 | Discharge: 2017-09-30 | Disposition: A | Discharge status: Home / Self Care | Payer: Medicaid Other | Source: Ambulatory Visit | Attending: Adult Health | Admitting: Adult Health

## 2017-09-30 ENCOUNTER — Encounter (HOSPITAL_COMMUNITY): Payer: Self-pay | Admitting: Internal Medicine

## 2017-09-30 VITALS — BP 138/84 | HR 58 | Wt 152.8 lb

## 2017-09-30 DIAGNOSIS — I429 Cardiomyopathy, unspecified: Secondary | ICD-10-CM | POA: Insufficient documentation

## 2017-09-30 DIAGNOSIS — K219 Gastro-esophageal reflux disease without esophagitis: Secondary | ICD-10-CM | POA: Insufficient documentation

## 2017-09-30 DIAGNOSIS — F329 Major depressive disorder, single episode, unspecified: Secondary | ICD-10-CM | POA: Diagnosis not present

## 2017-09-30 DIAGNOSIS — J45909 Unspecified asthma, uncomplicated: Secondary | ICD-10-CM | POA: Insufficient documentation

## 2017-09-30 DIAGNOSIS — F1410 Cocaine abuse, uncomplicated: Secondary | ICD-10-CM | POA: Diagnosis not present

## 2017-09-30 DIAGNOSIS — Z79899 Other long term (current) drug therapy: Secondary | ICD-10-CM | POA: Insufficient documentation

## 2017-09-30 DIAGNOSIS — I10 Essential (primary) hypertension: Secondary | ICD-10-CM

## 2017-09-30 DIAGNOSIS — R112 Nausea with vomiting, unspecified: Secondary | ICD-10-CM | POA: Diagnosis not present

## 2017-09-30 DIAGNOSIS — I110 Hypertensive heart disease with heart failure: Secondary | ICD-10-CM | POA: Insufficient documentation

## 2017-09-30 DIAGNOSIS — I5042 Chronic combined systolic (congestive) and diastolic (congestive) heart failure: Secondary | ICD-10-CM

## 2017-09-30 DIAGNOSIS — Z720 Tobacco use: Secondary | ICD-10-CM | POA: Diagnosis not present

## 2017-09-30 DIAGNOSIS — I340 Nonrheumatic mitral (valve) insufficiency: Secondary | ICD-10-CM | POA: Diagnosis not present

## 2017-09-30 DIAGNOSIS — E785 Hyperlipidemia, unspecified: Secondary | ICD-10-CM | POA: Insufficient documentation

## 2017-09-30 DIAGNOSIS — I493 Ventricular premature depolarization: Secondary | ICD-10-CM | POA: Diagnosis not present

## 2017-09-30 DIAGNOSIS — K2100 Gastro-esophageal reflux disease with esophagitis, without bleeding: Secondary | ICD-10-CM

## 2017-09-30 DIAGNOSIS — F1010 Alcohol abuse, uncomplicated: Secondary | ICD-10-CM | POA: Diagnosis not present

## 2017-09-30 DIAGNOSIS — R079 Chest pain, unspecified: Secondary | ICD-10-CM | POA: Insufficient documentation

## 2017-09-30 DIAGNOSIS — Q600 Renal agenesis, unilateral: Secondary | ICD-10-CM

## 2017-09-30 LAB — BASIC METABOLIC PANEL
Anion gap: 10 (ref 5–15)
BUN: 10 mg/dL (ref 6–20)
CHLORIDE: 107 mmol/L (ref 101–111)
CO2: 23 mmol/L (ref 22–32)
Calcium: 9.2 mg/dL (ref 8.9–10.3)
Creatinine, Ser: 1.45 mg/dL — ABNORMAL HIGH (ref 0.61–1.24)
GFR calc non Af Amer: 49 mL/min — ABNORMAL LOW (ref >60)
GFR, EST AFRICAN AMERICAN: 57 mL/min — AB (ref >60)
Glucose, Bld: 98 mg/dL (ref 65–99)
POTASSIUM: 4 mmol/L (ref 3.5–5.1)
Sodium: 140 mmol/L (ref 135–145)

## 2017-09-30 MED ORDER — PANTOPRAZOLE SODIUM 40 MG PO TBEC: 40.0000 mg | DELAYED_RELEASE_TABLET | Freq: Every day | ORAL | 11 refills | Status: DC

## 2017-09-30 MED ORDER — LOSARTAN POTASSIUM 50 MG PO TABS: 50.0000 mg | ORAL_TABLET | Freq: Two times a day (BID) | ORAL | 3 refills | Status: DC

## 2017-09-30 NOTE — Patient Instructions (Addendum)
Labs today (will call for abnormal results, otherwise no news is good news)  START taking Protonix 40 mg Once Daily  INCREASE Losartan to 50 mg Twice Daily  Follow up in 4-6 Months, we will call you to schedule appointment.

## 2017-09-30 NOTE — Progress Notes (Signed)
Advanced Heart Failure Clinic Note   Primary Cardiologist: Dr. Gwenlyn Found HF: Dr. [name]  PCP: None   HPI: Joe Garcia is a 67 y.o. 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 Former crack cocaine abuse.   Admitted 11/14 - 06/15/17 with CP and DOE. Received steroids and nebs. BNP minimally elevated on arrival. Troponin negative.  R/LHC below with normal cors and compensated hemodynamics. 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   Re-admitted 11/21-11/23/18 with cramping abdominal pain and GI symptoms. CT abdomen with no negative findings, treated as gastroenteritis. AKI noted with rise of creatinine from 1.0 -> 2.0 on admit. Improved to 1.7 at discharge. Pt states he never picked up [facility] after he left the hospital initially.   Seen in ED 09/09/17 with N/V and "chest pain". CP felt like his normal reflux. EKG with PVCs and NSR. Had been seen at home by paramedicine and told HR was in 30s, but normal in 60s on monitor. Likely undercounting due to PVCs.  Work up unremarkable.   Pt presents today for regular follow up. Overall, he is feeling much better. Echo today shows EF 40-45%. He continues to have palpitations. No syncope.  He is SOB with ADL's and minimal activity. No CP, but describes acid reflux discomfort. No edema or PND. No orthopnea.  Weights ~147 lbs at home. Not compliant with diet, but limited with food stamps. Followed by paramedicine.   ECHO 05/06/2017 EF 30-35% Grade IDD Echo 09/30/2017: EF 40-45%, grade 2 DD  Review of systems complete and found to be negative unless listed in HPI.    Past Medical History:  Diagnosis Date  . Alcoholism ([place])   . Arthritis    hips, L shoulder  . Asthma   . CHF (congestive heart failure) ([place])   . Depression   . Family history of anesthesia complication   . Hyperlipidemia   . Hypertension    Current Outpatient Medications  Medication Sig Dispense Refill  . acetaminophen (TYLENOL)  500 MG tablet Take 500-1,000 mg by mouth every 6 (six) hours as needed (for pain or headaches).    Marland Kitchen albuterol (PROVENTIL HFA;VENTOLIN HFA) 108 (90 BASE) MCG/ACT inhaler Inhale 1-2 puffs into the lungs every 6 (six) hours as needed for wheezing or shortness of breath. 1 Inhaler 0  . albuterol (PROVENTIL) (2.5 MG/3ML) 0.083% nebulizer solution Take 3 mLs (2.5 mg total) by nebulization every 6 (six) hours as needed for wheezing or shortness of breath. 75 mL 12  . amiodarone (PACERONE) 200 MG tablet Take 1 tablet (200 mg total) by mouth daily. 90 tablet 3  . carvedilol (COREG) 3.125 MG tablet Take 1 tablet (3.125 mg total) by mouth 2 (two) times daily with a meal. 60 tablet 5  . furosemide (LASIX) 20 MG tablet Take only as needed as directed by the CHF clinic. (Patient taking differently: Take 20 mg by mouth daily as needed for fluid. Take only as needed as directed by the CHF clinic.) 90 tablet 3  . ipratropium (ATROVENT) 0.02 % nebulizer solution Take 2.5 mLs (0.5 mg total) by nebulization 4 (four) times daily. 75 mL 12  . isosorbide-hydrALAZINE (BIDIL) 20-37.5 MG tablet Take 2 tablets by mouth 3 (three) times daily. 180 tablet 3  . losartan (COZAAR) 25 MG tablet Take 1 tablet (25 mg total) by mouth 2 (two) times daily. 60 tablet 11  . polyethylene glycol powder (GLYCOLAX/MIRALAX) powder Take 17 g by mouth daily as needed for  mild constipation.     No current facility-administered medications for this encounter.    Allergies  Allergen Reactions  . Lisinopril Swelling    Facial and tongue swelling 10/2012  . Penicillins Other (See Comments)    "Stomach pains" Has patient had a PCN reaction causing immediate rash, facial/tongue/throat swelling, SOB or lightheadedness with hypotension: Unk Has patient had a PCN reaction causing severe rash involving mucus membranes or skin necrosis: Unk Has patient had a PCN reaction that required hospitalization: Unk Has patient had a PCN reaction occurring within  the last 10 years: No If all of the above answers are "NO", then may proceed with Cephalosporin use.    Social History   Socioeconomic History  . Marital status: Single    Spouse name: Not on file  . Number of children: Not on file  . Years of education: Not on file  . Highest education level: Not on file  Social Needs  . Financial resource strain: Not on file  . Food insecurity - worry: Not on file  . Food insecurity - inability: Not on file  . Transportation needs - medical: Not on file  . Transportation needs - non-medical: Not on file  Occupational History  . Occupation: "it don't work for me", on disability  Tobacco Use  . Smoking status: Former Smoker    Packs/day: 0.10    Years: 45.00    Pack years: 4.50    Last attempt to quit: 2012    Years since quitting: 7.1  . Smokeless tobacco: Never Used  Substance and Sexual Activity  . Alcohol use: No    Frequency: Never    Comment: Pt states no etoh since April 2014  . Drug use: No    Comment: Prior use of crack cocaine, quit 2012  . Sexual activity: No  Other Topics Concern  . Not on file  Social History Narrative  . Not on file    Family History  Problem Relation Age of Onset  . Heart disease Father   . Heart disease Daughter        "fluid around heart"    Vitals:   09/30/17 1141  BP: 138/84  Pulse: (!) 58  SpO2: 98%  Weight: 152 lb 12.8 oz (69.3 kg)   Wt Readings from Last 3 Encounters:  09/30/17 152 lb 12.8 oz (69.3 kg)  09/17/17 151 lb (68.5 kg)  09/09/17 153 lb (69.4 kg)   PHYSICAL EXAM: General:  No resp difficulty. HEENT: Normal x for poor dentition. Anicteric Neck: Supple. JVP 5-6. Carotids 2+ bilat; no bruits. No thyromegaly or nodule noted. Cor: PMI nondisplaced. RRR, No M/G/R noted Lungs: Diminished throughout. No wheezing.  Abdomen: Soft, non-tender, non-distended, no HSM. No bruits or masses. +BS  Extremities: No cyanosis, clubbing, or rash. R and LLE no edema. Warm  Neuro: Alert &  orientedx3, cranial nerves grossly intact. moves all 4 extremities w/o difficulty. Affect pleasant   ASSESSMENT & PLAN: 1. Chronic Systolic Heart Failure. ECHO 05/06/2017 EF 30-35%. NICM. 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 Well compensated hemodynamics.  - NYHA II-III. Volume status stable. Can take lasix as needed.  - Continue coreg 3.125 mg BID.  - Continue bidil to 2 tab twice a day.  - Increase losartan to 50 mg BID. Check BMET today. - He refuses entresto due to solitary kidney.  - Reinforced fluid restriction to < 2 L daily, sodium restriction to less than 2000 mg daily, and the importance of daily  weights.    2. PVCs - Stable. Continue amio to 200 mg daily. No change.  3. HTN - Uncontrolled.  - Goal SBP < 130. -  Meds as above. Increase losartan to 50 mg BID 4. Solitary Kidney, Congenital - Follow renal function carefully with med adjustments.  - BMET today 5. Illiteracy - He can not read pill bottles. Paramedicine follows. No change.  6. Asthma- per PCP. No wheeze on exam. No change 7. GERD - Start protonix 40 mg daily 8. ED - Cannot do viagra with bidil - Discussed referral to urology, but he declined. >> Pt came back to clinic after appointment and requested urology referral. RN made aware.   Georgiana Shore, NP  Patient seen and examined with the above-signed Advanced Practice Provider and/or Housestaff. I personally reviewed laboratory data, imaging studies and relevant notes. I independently examined the patient and formulated the important aspects of the plan. I have edited the note to reflect any of my changes or salient points. I have personally discussed the plan with the patient and/or family.  Doing well. NYHA II-early III. Volume status looks good. Echo reviewed personally and EF 40-45% so no need fr ICD. On good meds. Will increase losartan to 50 bid with elevated BP. Watch renal function with solitary kidney. Referred to Urology to  discuss vacuum pump. Start protonix for GERD.   Glori Bickers, MD  2:20 PM

## 2017-10-09 ENCOUNTER — Other Ambulatory Visit (HOSPITAL_COMMUNITY): Payer: Self-pay

## 2017-10-09 ENCOUNTER — Encounter (HOSPITAL_COMMUNITY): Payer: Self-pay

## 2017-10-09 NOTE — Progress Notes (Signed)
Paramedicine Encounter    Patient ID: Joe Garcia, male    DOB: 08-26-1950, 67 y.o.   MRN: [id number]    Patient Care Team: Medicine, [facility] as PCP - General  Patient Active Problem List   Diagnosis Date Noted  . Malnutrition of moderate degree 07/16/2017  . Asthma exacerbation 07/14/2017  . Acute respiratory distress 07/14/2017  . 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 Solitary kidney, congenital 06/23/2017  . AKI (acute kidney injury) ([place]) 06/17/2017  . Epigastric pain 06/17/2017  . Elevated troponin 06/17/2017  . Nonischemic cardiomyopathy ([place])   . Multifocal PVCs   . Chronic combined systolic (congestive) and diastolic (congestive) heart failure ([place]) 06/11/2017  . Influenza A 07/17/2013  . Influenza 07/16/2013  . Fever 07/16/2013  . Tachycardia 07/16/2013  . Alcohol abuse 09/25/2012  . Alcohol dependence ([place]) 09/22/2012  . Essential hypertension 03/22/2007  . DEGENERATIVE DISC DISEASE 03/22/2007  . AVASCULAR NECROSIS 03/22/2007    Current Outpatient Medications:  .  amiodarone (PACERONE) 200 MG tablet, Take 1 tablet (200 mg total) by mouth daily., Disp: 90 tablet, Rfl: 3 .  carvedilol (COREG) 3.125 MG tablet, Take 1 tablet (3.125 mg total) by mouth 2 (two) times daily with a meal., Disp: 60 tablet, Rfl: 5 .  isosorbide-hydrALAZINE (BIDIL) 20-37.5 MG tablet, Take 2 tablets by mouth 3 (three) times daily., Disp: 180 tablet, Rfl: 3 .  losartan (COZAAR) 50 MG tablet, Take 1 tablet (50 mg total) by mouth 2 (two) times daily., Disp: 60 tablet, Rfl: 3 .  pantoprazole (PROTONIX) 40 MG tablet, Take 1 tablet (40 mg total) by mouth daily., Disp: 30 tablet, Rfl: 11 .  acetaminophen (TYLENOL) 500 MG tablet, Take 500-1,000 mg by mouth every 6 (six) hours as needed (for pain or headaches)., Disp: , Rfl:  .  albuterol (PROVENTIL HFA;VENTOLIN HFA) 108 (90 BASE) MCG/ACT inhaler, Inhale 1-2 puffs into the lungs every 6 (six) hours as needed for  wheezing or shortness of breath., Disp: 1 Inhaler, Rfl: 0 .  albuterol (PROVENTIL) (2.5 MG/3ML) 0.083% nebulizer solution, Take 3 mLs (2.5 mg total) by nebulization every 6 (six) hours as needed for wheezing or shortness of breath., Disp: 75 mL, Rfl: 12 .  furosemide (LASIX) 20 MG tablet, Take only as needed as directed by the CHF clinic. (Patient taking differently: Take 20 mg by mouth daily as needed for fluid. Take only as needed as directed by the CHF clinic.), Disp: 90 tablet, Rfl: 3 .  ipratropium (ATROVENT) 0.02 % nebulizer solution, Take 2.5 mLs (0.5 mg total) by nebulization 4 (four) times daily., Disp: 75 mL, Rfl: 12 .  polyethylene glycol powder (GLYCOLAX/MIRALAX) powder, Take 17 g by mouth daily as needed for mild constipation., Disp: , Rfl:  Allergies  Allergen Reactions  . Lisinopril Swelling    Facial and tongue swelling 10/2012  . Penicillins Other (See Comments)    "Stomach pains" Has patient had a PCN reaction causing immediate rash, facial/tongue/throat swelling, SOB or lightheadedness with hypotension: Unk Has patient had a PCN reaction causing severe rash involving mucus membranes or skin necrosis: Unk Has patient had a PCN reaction that required hospitalization: Unk Has patient had a PCN reaction occurring within the last 10 years: No If all of the above answers are "NO", then may proceed with Cephalosporin use.      Social History   Socioeconomic History  . Marital status: Single    Spouse name: Not on file  . Number of children:  Not on file  . Years of education: Not on file  . Highest education level: Not on file  Social Needs  . Financial resource strain: Not on file  . Food insecurity - worry: Not on file  . Food insecurity - inability: Not on file  . Transportation needs - medical: Not on file  . Transportation needs - non-medical: Not on file  Occupational History  . Occupation: "it don't work for me", on disability  Tobacco Use  . Smoking status:  Former Smoker    Packs/day: 0.10    Years: 45.00    Pack years: 4.50    Last attempt to quit: 2012    Years since quitting: 7.2  . Smokeless tobacco: Never Used  Substance and Sexual Activity  . Alcohol use: No    Frequency: Never    Comment: Pt states no etoh since April 2014  . Drug use: No    Comment: Prior use of crack cocaine, quit 2012  . Sexual activity: No  Other Topics Concern  . Not on file  Social History Narrative  . Not on file    Physical Exam  Pulmonary/Chest: No respiratory distress.  Abdominal: He exhibits no distension.  Musculoskeletal: He exhibits no edema.  Skin: Skin is warm and dry. He is not diaphoretic.        No future appointments.  ATF pt CAO x4 sitting in the livingroom with no complaints.  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 He stated that he will not be doing anything manual and he plan on pacing himself.  He stated that if it gets too much for him, he will stop.  Pt stated that he has been doing a lot of walking around and he feels a lot better than in the past.  Pt has filled his own pill box this week,  I checked his box and corrected the mistakes.  We went over his meds and their frequency, he got them correct.  I think that the extra pill may have been a over sight.  Pt denies sob, chest pain and dizziness.  rx bottles verified and pill box verified.   BP (!) 150/86 (BP Location: Left Arm, Patient Position: Sitting, Cuff Size: Normal)   Pulse (!) 53   Resp 16   Wt 150 lb (68 kg)   SpO2 99%   BMI 20.92 kg/m   Weight yesterday-didn't weigh Last visit weight-153    Sarabeth Benton, EMT Paramedic 10/12/2017    ACTION: Home visit completed

## 2017-11-02 ENCOUNTER — Other Ambulatory Visit: Payer: Self-pay

## 2017-11-02 ENCOUNTER — Emergency Department (HOSPITAL_COMMUNITY): Payer: Medicaid Other

## 2017-11-02 ENCOUNTER — Emergency Department (HOSPITAL_COMMUNITY)
Admission: EM | Admit: 2017-11-02 | Discharge: 2017-11-02 | Disposition: A | Discharge status: Home / Self Care | Payer: Medicaid Other | Attending: Emergency Medicine | Admitting: Emergency Medicine

## 2017-11-02 ENCOUNTER — Encounter (HOSPITAL_COMMUNITY): Payer: Self-pay | Admitting: *Deleted

## 2017-11-02 DIAGNOSIS — I130 Hypertensive heart and chronic kidney disease with heart failure and stage 1 through stage 4 chronic kidney disease, or unspecified chronic kidney disease: Secondary | ICD-10-CM | POA: Insufficient documentation

## 2017-11-02 DIAGNOSIS — J4521 Mild intermittent asthma with (acute) exacerbation: Secondary | ICD-10-CM

## 2017-11-02 DIAGNOSIS — J45909 Unspecified asthma, uncomplicated: Secondary | ICD-10-CM | POA: Diagnosis not present

## 2017-11-02 DIAGNOSIS — Z79899 Other long term (current) drug therapy: Secondary | ICD-10-CM | POA: Insufficient documentation

## 2017-11-02 DIAGNOSIS — I5042 Chronic combined systolic (congestive) and diastolic (congestive) heart failure: Secondary | ICD-10-CM | POA: Insufficient documentation

## 2017-11-02 DIAGNOSIS — N182 Chronic kidney disease, stage 2 (mild): Secondary | ICD-10-CM | POA: Insufficient documentation

## 2017-11-02 DIAGNOSIS — Z87891 Personal history of nicotine dependence: Secondary | ICD-10-CM | POA: Diagnosis not present

## 2017-11-02 DIAGNOSIS — R0602 Shortness of breath: Secondary | ICD-10-CM | POA: Diagnosis present

## 2017-11-02 LAB — BASIC METABOLIC PANEL
Anion gap: 9 (ref 5–15)
BUN: 17 mg/dL (ref 6–20)
CALCIUM: 9.1 mg/dL (ref 8.9–10.3)
CO2: 21 mmol/L — ABNORMAL LOW (ref 22–32)
CREATININE: 1.44 mg/dL — AB (ref 0.61–1.24)
Chloride: 108 mmol/L (ref 101–111)
GFR, EST AFRICAN AMERICAN: 58 mL/min — AB (ref >60)
GFR, EST NON AFRICAN AMERICAN: 50 mL/min — AB (ref >60)
Glucose, Bld: 92 mg/dL (ref 65–99)
Potassium: 4.1 mmol/L (ref 3.5–5.1)
SODIUM: 138 mmol/L (ref 135–145)

## 2017-11-02 LAB — CBC
HCT: 40.9 % (ref 39.0–52.0)
Hemoglobin: 13.5 g/dL (ref 13.0–17.0)
MCH: 28.5 pg (ref 26.0–34.0)
MCHC: 33 g/dL (ref 30.0–36.0)
MCV: 86.3 fL (ref 78.0–100.0)
PLATELETS: 286 10*3/uL (ref 150–400)
RBC: 4.74 MIL/uL (ref 4.22–5.81)
RDW: 15.4 % (ref 11.5–15.5)
WBC: 4.7 10*3/uL (ref 4.0–10.5)

## 2017-11-02 LAB — I-STAT TROPONIN, ED
TROPONIN I, POC: 0 ng/mL (ref 0.00–0.08)
TROPONIN I, POC: 0 ng/mL (ref 0.00–0.08)

## 2017-11-02 LAB — BRAIN NATRIURETIC PEPTIDE: B Natriuretic Peptide: 65.8 pg/mL (ref 0.0–100.0)

## 2017-11-02 LAB — D-DIMER, QUANTITATIVE: D-Dimer, Quant: 0.31 ug/mL-FEU (ref 0.00–0.50)

## 2017-11-02 MED ORDER — ALBUTEROL SULFATE (2.5 MG/3ML) 0.083% IN NEBU
5.0000 mg | INHALATION_SOLUTION | Freq: Once | RESPIRATORY_TRACT | Status: AC
  Administered 2017-11-02: 5 mg via RESPIRATORY_TRACT
  Filled 2017-11-02: qty 6

## 2017-11-02 MED ORDER — DEXAMETHASONE SODIUM PHOSPHATE 10 MG/ML IJ SOLN: 10.0000 mg | Freq: Once | INTRAMUSCULAR | Status: DC

## 2017-11-02 MED ORDER — ALBUTEROL SULFATE HFA 108 (90 BASE) MCG/ACT IN AERS: 1.0000 | INHALATION_SPRAY | Freq: Four times a day (QID) | RESPIRATORY_TRACT | 0 refills | Status: DC | PRN

## 2017-11-02 MED ORDER — DEXAMETHASONE SODIUM PHOSPHATE 10 MG/ML IJ SOLN
10.0000 mg | Freq: Once | INTRAMUSCULAR | Status: AC
  Administered 2017-11-02: 10 mg via INTRAVENOUS
  Filled 2017-11-02: qty 1

## 2017-11-02 MED ORDER — ALBUTEROL SULFATE (2.5 MG/3ML) 0.083% IN NEBU: 2.5000 mg | INHALATION_SOLUTION | Freq: Four times a day (QID) | RESPIRATORY_TRACT | 12 refills | Status: DC | PRN

## 2017-11-02 MED ORDER — ALBUTEROL SULFATE HFA 108 (90 BASE) MCG/ACT IN AERS
1.0000 | INHALATION_SPRAY | Freq: Once | RESPIRATORY_TRACT | Status: AC
  Administered 2017-11-02: 1 via RESPIRATORY_TRACT
  Filled 2017-11-02: qty 6.7

## 2017-11-02 NOTE — ED Notes (Signed)
Patient reports feeling short of breath since yesterday. He states that he took some breathing treatment and did not feel better. He reports that he cannot walk, he also report a productive cough.

## 2017-11-02 NOTE — ED Triage Notes (Addendum)
Pt reports sob with mild exertion since this am. Has hx of asthma, no relief with inhaler. States "he needs a breathing treatment." Reports productive cough and n/v. Also has cardiac hx.

## 2017-11-02 NOTE — ED Provider Notes (Signed)
[facility] EMERGENCY DEPARTMENT Provider Note   CSN: [id number] Arrival date & time: 11/02/17  1225     History   Chief Complaint Chief Complaint  Patient presents with  . Shortness of Breath    HPI Joe Garcia is a 67 y.o. male with past medical history of CHF, asthma, hypertension, who presents to ED for evaluation of 24-hour history of acute onset dyspnea on exertion.  He states that yesterday he was walking when all of a sudden he got short of breath.  He tried several puffs of his albuterol inhaler and nebulizer treatments at home with no improvement in his symptoms.  He also reports intermittent cough and several episodes of posttussive emesis since yesterday.  He denies any wheezing.  No previous history of similar symptoms.  Denies any chest pain, hemoptysis, abdominal pain, fever, other URI symptoms, sick contacts with similar symptoms, recent surgeries, recent prolonged travel, prior MI, DVT or PE, leg swelling, hormone use.  His PCP has discontinued his diuretic for the past several months.  HPI  Past Medical History:  Diagnosis Date  . Alcoholism ([place])   . Arthritis    hips, L shoulder  . Asthma   . CHF (congestive heart failure) ([place])   . Depression   . Family history of anesthesia complication   . Hyperlipidemia   . Hypertension     Patient Active Problem List   Diagnosis Date Noted  . Malnutrition of moderate degree 07/16/2017  . Asthma exacerbation 07/14/2017  . Acute respiratory distress 07/14/2017  . 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 Solitary kidney, congenital 06/23/2017  . AKI (acute kidney injury) ([place]) 06/17/2017  . Epigastric pain 06/17/2017  . Elevated troponin 06/17/2017  . Nonischemic cardiomyopathy ([place])   . Multifocal PVCs   . Chronic combined systolic (congestive) and diastolic (congestive) heart failure ([place]) 06/11/2017  . Influenza A 07/17/2013  . Influenza 07/16/2013  . Fever 07/16/2013  .  Tachycardia 07/16/2013  . Alcohol abuse 09/25/2012  . Alcohol dependence ([place]) 09/22/2012  . Essential hypertension 03/22/2007  . DEGENERATIVE DISC DISEASE 03/22/2007  . 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 RIGHT/LEFT HEART CATH AND CORONARY ANGIOGRAPHY N/A 06/12/2017   Procedure: RIGHT/LEFT HEART CATH AND CORONARY ANGIOGRAPHY;  Surgeon: Jolaine Artist, MD;  Location: [facility];  Service: Cardiovascular;  Laterality: N/A;        Home Medications    Prior to Admission medications   Medication Sig Start Date End Date Taking? Authorizing Provider  acetaminophen (TYLENOL) 500 MG tablet Take 500-1,000 mg by mouth every 6 (six) hours as needed (for pain or headaches).   Yes [provider]  amiodarone (PACERONE) 200 MG tablet Take 1 tablet (200 mg total) by mouth daily. 07/13/17  Yes Clegg, Amy D, NP  carvedilol (COREG) 3.125 MG tablet Take 1 tablet (3.125 mg total) by mouth 2 (two) times daily with a meal. 07/24/17  Yes Clegg, Amy D, NP  furosemide (LASIX) 20 MG tablet Take only as needed as directed by the CHF clinic. Patient taking differently: Take 20 mg by mouth daily as needed for fluid. Take only as needed as directed by the CHF clinic. 09/03/17  Yes Shirley Friar, PA-C  ipratropium (ATROVENT) 0.02 % nebulizer solution Take 2.5 mLs (0.5 mg total) by nebulization 4 (four) times daily. 07/26/17  Yes Pisciotta, Elmyra Ricks, PA-C  isosorbide-hydrALAZINE (BIDIL) 20-37.5 MG tablet Take 2 tablets by  mouth 3 (three) times daily. 08/07/17  Yes Clegg, Amy D, NP  losartan (COZAAR) 50 MG tablet Take 1 tablet (50 mg total) by mouth 2 (two) times daily. 09/30/17  Yes Bensimhon, Shaune Pascal, MD  pantoprazole (PROTONIX) 40 MG tablet Take 1 tablet (40 mg total) by mouth daily. 09/30/17  Yes Bensimhon, Shaune Pascal, MD  polyethylene glycol powder (GLYCOLAX/MIRALAX) powder Take 17 g by mouth daily as needed for mild constipation.   Yes  [provider]  albuterol (PROVENTIL HFA;VENTOLIN HFA) 108 (90 Base) MCG/ACT inhaler Inhale 1-2 puffs into the lungs every 6 (six) hours as needed for wheezing or shortness of breath. 11/02/17   Coen Miyasato, PA-C  albuterol (PROVENTIL) (2.5 MG/3ML) 0.083% nebulizer solution Take 3 mLs (2.5 mg total) by nebulization every 6 (six) hours as needed for wheezing or shortness of breath. 11/02/17   Delia Heady, PA-C    Family History Family History  Problem Relation Age of Onset  . Heart disease Father   . Heart disease Daughter        "fluid around heart"     Social History Social History   Tobacco Use  . Smoking status: Former Smoker    Packs/day: 0.10    Years: 45.00    Pack years: 4.50    Last attempt to quit: 2012    Years since quitting: 7.2  . Smokeless tobacco: Never Used  Substance Use Topics  . Alcohol use: No    Frequency: Never    Comment: Pt states no etoh since April 2014  . Drug use: No    Comment: Prior use of crack cocaine, quit 2012     Allergies   Lisinopril and Penicillins   Review of Systems Review of Systems  Constitutional: Negative for appetite change, chills and fever.  HENT: Negative for ear pain, rhinorrhea, sneezing and sore throat.   Eyes: Negative for photophobia and visual disturbance.  Respiratory: Positive for cough and shortness of breath. Negative for chest tightness and wheezing.   Cardiovascular: Negative for chest pain and palpitations.  Gastrointestinal: Negative for abdominal pain, blood in stool, constipation, diarrhea, nausea and vomiting.  Genitourinary: Negative for dysuria, hematuria and urgency.  Musculoskeletal: Negative for myalgias.  Skin: Negative for rash.  Neurological: Negative for dizziness, weakness and light-headedness.     Physical Exam Updated Vital Signs BP (!) 159/85   Pulse 66   Temp 98.2 F (36.8 C) (Oral)   Resp 13   Ht 5\' 11"  (1.803 m)   Wt 66.7 kg (147 lb)   SpO2 97%   BMI 20.50 kg/m    Physical Exam  Constitutional: He appears well-developed and well-nourished. No distress.  HENT:  Head: Normocephalic and atraumatic.  Nose: Nose normal.  Eyes: Conjunctivae and EOM are normal. Left eye exhibits no discharge. No scleral icterus.  Neck: Normal range of motion. Neck supple.  Cardiovascular: Normal rate, regular rhythm, normal heart sounds and intact distal pulses. Exam reveals no gallop and no friction rub.  No murmur heard. Pulmonary/Chest: Effort normal and breath sounds normal. No respiratory distress.  Abdominal: Soft. Bowel sounds are normal. He exhibits no distension. There is no tenderness. There is no guarding.  Musculoskeletal: Normal range of motion. He exhibits no edema.  No lower extremity edema, erythema or calf tenderness noted bilaterally.  Neurological: He is alert. He exhibits normal muscle tone. Coordination normal.  Skin: Skin is warm and dry. No rash noted.  Psychiatric: He has a normal mood and affect.  Nursing note  and vitals reviewed.    ED Treatments / Results  Labs (all labs ordered are listed, but only abnormal results are displayed) Labs Reviewed  BASIC METABOLIC PANEL - Abnormal; Notable for the following components:      Result Value   CO2 21 (*)    Creatinine, Ser 1.44 (*)    GFR calc non Af Amer 50 (*)    GFR calc Af Amer 58 (*)    All other components within normal limits  CBC  BRAIN NATRIURETIC PEPTIDE  D-DIMER, QUANTITATIVE (NOT AT [facility])  I-STAT TROPONIN, ED  I-STAT TROPONIN, ED    EKG EKG Interpretation  Date/Time:  Monday November 02 2017 14:00:08 EDT Ventricular Rate:  74 PR Interval:  144 QRS Duration: 96 QT Interval:  460 QTC Calculation: 510 R Axis:   69 Text Interpretation:  Sinus rhythm with Possible Premature atrial complexes with Abberant conduction Left ventricular hypertrophy Prolonged QT Abnormal ECG No significant change since last tracing Confirmed by Isla Pence [phone]) on 11/02/2017 5:39:47  PM   Radiology Dg Chest 2 View  Result Date: 11/02/2017 CLINICAL DATA:  Dyspnea, not improved with breathing treatment. EXAM: CHEST - 2 VIEW COMPARISON:  Chest radiograph September 09, 2017 FINDINGS: Cardiomediastinal silhouette is normal. Mildly calcified aortic arch. No pleural effusions or focal consolidations. Stable hyperinflation with mildly flattened hemidiaphragms. Trachea projects midline and there is no pneumothorax. Nipple shadows projecting in lung bases. Soft tissue planes and included osseous structures are non-suspicious. Small hiatal hernia versus lateral osteophytic spurring. Stable degenerative change of the thoracic spine. IMPRESSION: 1. Stable hyperinflation without focal consolidation. 2.  Aortic Atherosclerosis (ICD10-I70.0). Electronically Signed   By: Elon Alas M.D.   On: 11/02/2017 17:05    Procedures Procedures (including critical care time)  Medications Ordered in ED Medications  albuterol (PROVENTIL) (2.5 MG/3ML) 0.083% nebulizer solution 5 mg (5 mg Nebulization Given 11/02/17 1404)  albuterol (PROVENTIL HFA;VENTOLIN HFA) 108 (90 Base) MCG/ACT inhaler 1 puff (1 puff Inhalation Given 11/02/17 2223)  dexamethasone (DECADRON) injection 10 mg (10 mg Intravenous Given 11/02/17 2221)     Initial Impression / Assessment and Plan / ED Course  I have reviewed the triage vital signs and the nursing notes.  Pertinent labs & imaging results that were available during my care of the patient were reviewed by me and considered in my medical decision making (see chart for details).     Tends to ED for evaluation of 24-hour history of acute onset dyspnea on exertion.  He has used his albuterol inhaler nebulizer treatments with no improvement in his symptoms.  He also reports intermittent cough and several episodes of posttussive emesis yesterday.  Denies any chest pain, hemoptysis, fever, other URI symptoms, abdominal pain.  On physical exam I evaluated the patient after  breathing treatment was administered.  He reports some improvement in his symptoms with that.  Lungs are clear to auscultation bilaterally.  He has no lower extremity edema, erythema or calf tenderness noted.  Lab work including CBC, BMP unremarkable.  Initial troponin negative.  EKG with no ischemic changes or any significant changes from previous tracing.  D-dimer was negative to evaluate for PE.  Delta troponin was negative as well.  BNP unremarkable.  I suspect that his symptoms are due to an asthma exacerbation.  Will give patient Decadron IM, refill of albuterol inhaler and nebulizer treatment.  Advised to follow-up with PCP for further evaluation if symptoms persist.  Patient appears stable for discharge at this time.  Strict  return precautions given.  Portions of this note were generated with [employer]. Dictation errors may occur despite best attempts at proofreading.   Final Clinical Impressions(s) / ED Diagnoses   Final diagnoses:  Mild intermittent asthma with exacerbation    ED Discharge Orders        Ordered    albuterol (PROVENTIL) (2.5 MG/3ML) 0.083% nebulizer solution  Every 6 hours PRN     11/02/17 2326    albuterol (PROVENTIL HFA;VENTOLIN HFA) 108 (90 Base) MCG/ACT inhaler  Every 6 hours PRN     11/02/17 2326       Delia Heady, PA-C 11/02/17 2326    Isla Pence, MD 11/02/17 2337

## 2017-11-19 ENCOUNTER — Encounter (HOSPITAL_COMMUNITY): Payer: Self-pay

## 2017-11-19 ENCOUNTER — Other Ambulatory Visit (HOSPITAL_COMMUNITY): Payer: Self-pay

## 2017-11-19 NOTE — Progress Notes (Signed)
Paramedicine Encounter    Patient ID: Joe Garcia, male    DOB: 1950/11/27, 67 y.o.   MRN: [id number]    Patient Care Team: Medicine, [facility] as PCP - General  Patient Active Problem List   Diagnosis Date Noted  . Malnutrition of moderate degree 07/16/2017  . Asthma exacerbation 07/14/2017  . Acute respiratory distress 07/14/2017  . 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 Solitary kidney, congenital 06/23/2017  . AKI (acute kidney injury) ([place]) 06/17/2017  . Epigastric pain 06/17/2017  . Elevated troponin 06/17/2017  . Nonischemic cardiomyopathy ([place])   . Multifocal PVCs   . Chronic combined systolic (congestive) and diastolic (congestive) heart failure ([place]) 06/11/2017  . Influenza A 07/17/2013  . Influenza 07/16/2013  . Fever 07/16/2013  . Tachycardia 07/16/2013  . Alcohol abuse 09/25/2012  . Alcohol dependence ([place]) 09/22/2012  . Essential hypertension 03/22/2007  . DEGENERATIVE DISC DISEASE 03/22/2007  . AVASCULAR NECROSIS 03/22/2007    Current Outpatient Medications:  .  amiodarone (PACERONE) 200 MG tablet, Take 1 tablet (200 mg total) by mouth daily., Disp: 90 tablet, Rfl: 3 .  carvedilol (COREG) 3.125 MG tablet, Take 1 tablet (3.125 mg total) by mouth 2 (two) times daily with a meal., Disp: 60 tablet, Rfl: 5 .  isosorbide-hydrALAZINE (BIDIL) 20-37.5 MG tablet, Take 2 tablets by mouth 3 (three) times daily., Disp: 180 tablet, Rfl: 3 .  losartan (COZAAR) 50 MG tablet, Take 1 tablet (50 mg total) by mouth 2 (two) times daily., Disp: 60 tablet, Rfl: 3 .  acetaminophen (TYLENOL) 500 MG tablet, Take 500-1,000 mg by mouth every 6 (six) hours as needed (for pain or headaches)., Disp: , Rfl:  .  albuterol (PROVENTIL HFA;VENTOLIN HFA) 108 (90 Base) MCG/ACT inhaler, Inhale 1-2 puffs into the lungs every 6 (six) hours as needed for wheezing or shortness of breath., Disp: 1 Inhaler, Rfl: 0 .  albuterol (PROVENTIL) (2.5 MG/3ML) 0.083% nebulizer  solution, Take 3 mLs (2.5 mg total) by nebulization every 6 (six) hours as needed for wheezing or shortness of breath., Disp: 75 mL, Rfl: 12 .  furosemide (LASIX) 20 MG tablet, Take only as needed as directed by the CHF clinic. (Patient taking differently: Take 20 mg by mouth daily as needed for fluid. Take only as needed as directed by the CHF clinic.), Disp: 90 tablet, Rfl: 3 .  ipratropium (ATROVENT) 0.02 % nebulizer solution, Take 2.5 mLs (0.5 mg total) by nebulization 4 (four) times daily., Disp: 75 mL, Rfl: 12 .  pantoprazole (PROTONIX) 40 MG tablet, Take 1 tablet (40 mg total) by mouth daily., Disp: 30 tablet, Rfl: 11 .  polyethylene glycol powder (GLYCOLAX/MIRALAX) powder, Take 17 g by mouth daily as needed for mild constipation., Disp: , Rfl:  Allergies  Allergen Reactions  . Lisinopril Swelling    Facial and tongue swelling 10/2012  . Penicillins Other (See Comments)    "Stomach pains" Has patient had a PCN reaction causing immediate rash, facial/tongue/throat swelling, SOB or lightheadedness with hypotension: Unk Has patient had a PCN reaction causing severe rash involving mucus membranes or skin necrosis: Unk Has patient had a PCN reaction that required hospitalization: Unk Has patient had a PCN reaction occurring within the last 10 years: No If all of the above answers are "NO", then may proceed with Cephalosporin use.      Social History   Socioeconomic History  . Marital status: Single    Spouse name: Not on file  . Number of children:  Not on file  . Years of education: Not on file  . Highest education level: Not on file  Occupational History  . Occupation: "it don't work for me", on disability  Social Needs  . Financial resource strain: Not on file  . Food insecurity:    Worry: Not on file    Inability: Not on file  . Transportation needs:    Medical: Not on file    Non-medical: Not on file  Tobacco Use  . Smoking status: Former Smoker    Packs/day: 0.10     Years: 45.00    Pack years: 4.50    Last attempt to quit: 2012    Years since quitting: 7.3  . Smokeless tobacco: Never Used  Substance and Sexual Activity  . Alcohol use: No    Frequency: Never    Comment: Pt states no etoh since April 2014  . Drug use: No    Comment: Prior use of crack cocaine, quit 2012  . Sexual activity: Never  Lifestyle  . Physical activity:    Days per week: Not on file    Minutes per session: Not on file  . Stress: Not on file  Relationships  . Social connections:    Talks on phone: Not on file    Gets together: Not on file    Attends religious service: Not on file    Active member of club or organization: Not on file    Attends meetings of clubs or organizations: Not on file    Relationship status: Not on file  . Intimate partner violence:    Fear of current or ex partner: Not on file    Emotionally abused: Not on file    Physically abused: Not on file    Forced sexual activity: Not on file  Other Topics Concern  . Not on file  Social History Narrative  . Not on file    Physical Exam  Pulmonary/Chest: Effort normal.  Abdominal: Soft. He exhibits no distension.  Musculoskeletal: He exhibits no edema.  Skin: Skin is warm and dry. He is not diaphoretic.        No future appointments.  ATF pt CAO x4 sitting in a chair in the front yard w/no complaints.  Today's visit was pt's last CHP visit.  Pt denies sob, chest pain and dizziness.  He stated that he hasn't taken any lasix in "a while".  He stated that he went to the ED last week due to SOB, then he realized that the sob was due to his heart.  Pt stated he now recognizes that if the sob is not due to asthma, that he needs to slow down because the sob is due to his heart. Pt stated that he feels good now.  Pt has been taking his medications without difficulty and filling his pill box.  We went over his meds and how to take.  rx bottles verified and pill box verified. Pt and I agree that he's  reached his fullest potential in the [facility] program.  We discussed when to call the heart failure clinic (3lbs over night and 5lbs within a week).  **want [facility] to delivery his meds  BP 136/86 (BP Location: Left Arm, Patient Position: Sitting, Cuff Size: Normal)   Pulse 60   Resp 16   Wt 149 lb 6.4 oz (67.8 kg)   SpO2 98%   BMI 20.84 kg/m   Weight yesterday-didn't weigh     Vetta Couzens, EMT Paramedic 11/19/2017  ACTION: Home visit completed

## 2017-11-25 ENCOUNTER — Telehealth (HOSPITAL_COMMUNITY): Payer: Self-pay | Admitting: Surgery

## 2017-11-25 NOTE — Telephone Encounter (Signed)
Joe Garcia will be discharged at this time from the [facility] to successful completion. He will continue to be followed in the [facility] Clinic and is aware when to call the clinic with worsening signs and symptoms.

## 2017-12-02 ENCOUNTER — Telehealth: Payer: Self-pay | Admitting: Cardiovascular Disease

## 2017-12-02 NOTE — Telephone Encounter (Signed)
Returned call to pt but line busy at this time

## 2017-12-02 NOTE — Telephone Encounter (Signed)
New message  Pt verbalzied that she is calling for RN  Pt findings and any orders are needed for pt care  Follow up care after ER visit and abnormal EKG  Please fax to [facility] [phone]

## 2017-12-03 NOTE — Telephone Encounter (Signed)
Number is to [facility], left a message to return the call, not sure what they are needing.

## 2017-12-05 ENCOUNTER — Emergency Department (HOSPITAL_COMMUNITY)
Admission: EM | Admit: 2017-12-05 | Discharge: 2017-12-05 | Disposition: A | Discharge status: Home / Self Care | Payer: Medicaid Other | Attending: Emergency Medicine | Admitting: Emergency Medicine

## 2017-12-05 ENCOUNTER — Encounter (HOSPITAL_COMMUNITY): Payer: Self-pay

## 2017-12-05 ENCOUNTER — Other Ambulatory Visit: Payer: Self-pay

## 2017-12-05 ENCOUNTER — Emergency Department (HOSPITAL_COMMUNITY): Payer: Medicaid Other

## 2017-12-05 DIAGNOSIS — J45909 Unspecified asthma, uncomplicated: Secondary | ICD-10-CM | POA: Diagnosis not present

## 2017-12-05 DIAGNOSIS — I130 Hypertensive heart and chronic kidney disease with heart failure and stage 1 through stage 4 chronic kidney disease, or unspecified chronic kidney disease: Secondary | ICD-10-CM | POA: Insufficient documentation

## 2017-12-05 DIAGNOSIS — Z79899 Other long term (current) drug therapy: Secondary | ICD-10-CM | POA: Insufficient documentation

## 2017-12-05 DIAGNOSIS — R0600 Dyspnea, unspecified: Secondary | ICD-10-CM | POA: Diagnosis present

## 2017-12-05 DIAGNOSIS — R05 Cough: Secondary | ICD-10-CM | POA: Diagnosis not present

## 2017-12-05 DIAGNOSIS — J441 Chronic obstructive pulmonary disease with (acute) exacerbation: Secondary | ICD-10-CM | POA: Diagnosis not present

## 2017-12-05 DIAGNOSIS — I5042 Chronic combined systolic (congestive) and diastolic (congestive) heart failure: Secondary | ICD-10-CM | POA: Diagnosis not present

## 2017-12-05 DIAGNOSIS — Z87891 Personal history of nicotine dependence: Secondary | ICD-10-CM | POA: Insufficient documentation

## 2017-12-05 DIAGNOSIS — N182 Chronic kidney disease, stage 2 (mild): Secondary | ICD-10-CM | POA: Diagnosis not present

## 2017-12-05 LAB — BRAIN NATRIURETIC PEPTIDE: B Natriuretic Peptide: 128.9 pg/mL — ABNORMAL HIGH (ref 0.0–100.0)

## 2017-12-05 LAB — COMPREHENSIVE METABOLIC PANEL
ALBUMIN: 3.6 g/dL (ref 3.5–5.0)
ALT: 10 U/L — ABNORMAL LOW (ref 17–63)
AST: 13 U/L — AB (ref 15–41)
Alkaline Phosphatase: 71 U/L (ref 38–126)
Anion gap: 8 (ref 5–15)
BUN: 14 mg/dL (ref 6–20)
CHLORIDE: 108 mmol/L (ref 101–111)
CO2: 24 mmol/L (ref 22–32)
Calcium: 8.8 mg/dL — ABNORMAL LOW (ref 8.9–10.3)
Creatinine, Ser: 1.47 mg/dL — ABNORMAL HIGH (ref 0.61–1.24)
GFR calc Af Amer: 56 mL/min — ABNORMAL LOW (ref >60)
GFR calc non Af Amer: 49 mL/min — ABNORMAL LOW (ref >60)
GLUCOSE: 93 mg/dL (ref 65–99)
POTASSIUM: 3.5 mmol/L (ref 3.5–5.1)
SODIUM: 140 mmol/L (ref 135–145)
TOTAL PROTEIN: 6.4 g/dL — AB (ref 6.5–8.1)
Total Bilirubin: 0.5 mg/dL (ref 0.3–1.2)

## 2017-12-05 LAB — CBC
HCT: 39.4 % (ref 39.0–52.0)
Hemoglobin: 13 g/dL (ref 13.0–17.0)
MCH: 28.3 pg (ref 26.0–34.0)
MCHC: 33 g/dL (ref 30.0–36.0)
MCV: 85.7 fL (ref 78.0–100.0)
PLATELETS: 252 10*3/uL (ref 150–400)
RBC: 4.6 MIL/uL (ref 4.22–5.81)
RDW: 14.8 % (ref 11.5–15.5)
WBC: 4.8 10*3/uL (ref 4.0–10.5)

## 2017-12-05 MED ORDER — ALBUTEROL SULFATE (2.5 MG/3ML) 0.083% IN NEBU
5.0000 mg | INHALATION_SOLUTION | Freq: Once | RESPIRATORY_TRACT | Status: AC
  Administered 2017-12-05: 5 mg via RESPIRATORY_TRACT
  Filled 2017-12-05: qty 6

## 2017-12-05 MED ORDER — PREDNISONE 10 MG PO TABS: 20.0000 mg | ORAL_TABLET | Freq: Every day | ORAL | 0 refills | Status: DC

## 2017-12-05 MED ORDER — PREDNISONE 20 MG PO TABS
40.0000 mg | ORAL_TABLET | Freq: Once | ORAL | Status: AC
  Administered 2017-12-05: 40 mg via ORAL
  Filled 2017-12-05: qty 2

## 2017-12-05 MED ORDER — ALBUTEROL SULFATE (2.5 MG/3ML) 0.083% IN NEBU
5.0000 mg | INHALATION_SOLUTION | Freq: Once | RESPIRATORY_TRACT | Status: AC
Start: 2017-12-05 — End: 2017-12-05
  Administered 2017-12-05: 5 mg via RESPIRATORY_TRACT
  Filled 2017-12-05: qty 6

## 2017-12-05 NOTE — ED Triage Notes (Signed)
Pt endorses asthma flare up x 2 days. Has been using inhaler and nebulizer with temporary relief. spo2 100% on RA. Pt appears to be very winded.

## 2017-12-05 NOTE — ED Notes (Signed)
Pt verbalizes understanding of d/c instructions.  Gave pt a bag meal and ginger ale.

## 2017-12-05 NOTE — ED Provider Notes (Addendum)
[facility] EMERGENCY DEPARTMENT Provider Note   CSN: [id number] Arrival date & time: 12/05/17  1139     History   Chief Complaint Chief Complaint  Patient presents with  . Asthma    HPI Joe Garcia is a 67 y.o. male.  HPI  67 year old man history of COPD and CHF presents today with increased dyspnea and cough productive of colored sputum.  He states that this began fairly abruptly yesterday while he was walking.  He has been using his inhaler "too often".  He has a history of CHF and states that his weight was up yesterday.  He took his PRN Lasix.  He does not feel like he has any volume overload at this time.  He identifies this is being consistent with his previous asthma events.  He was given an albuterol nebulizer prior to my evaluation and states that he feels that his lungs are much clearer now.  Review of records reveal that he has nonischemic cardiomyopathy with ejection fraction of 25 to 30% per cardiac cath done in June 12, 2017 with grade 1 diastolic dysfunction.  He endorses both dyspnea on exertion and orthopnea.  He has required BiPAP on multiple occasions.  He denies any pain currently.  He denies any fever or chills.  He does not remember the last time that he was on steroids.  He reports taking his medications as prescribed.  He denies smoking, alcohol, or illicit drugs.  Past Medical History:  Diagnosis Date  . Alcoholism ([place])   . Arthritis    hips, L shoulder  . Asthma   . CHF (congestive heart failure) ([place])   . Depression   . Family history of anesthesia complication   . Hyperlipidemia   . Hypertension     Patient Active Problem List   Diagnosis Date Noted  . Malnutrition of moderate degree 07/16/2017  . Asthma exacerbation 07/14/2017  . Acute respiratory distress 07/14/2017  . 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 Solitary kidney, congenital 06/23/2017  . AKI (acute kidney injury) ([place]) 06/17/2017  .  Epigastric pain 06/17/2017  . Elevated troponin 06/17/2017  . Nonischemic cardiomyopathy ([place])   . Multifocal PVCs   . Chronic combined systolic (congestive) and diastolic (congestive) heart failure ([place]) 06/11/2017  . Influenza A 07/17/2013  . Influenza 07/16/2013  . Fever 07/16/2013  . Tachycardia 07/16/2013  . Alcohol abuse 09/25/2012  . Alcohol dependence ([place]) 09/22/2012  . Essential hypertension 03/22/2007  . DEGENERATIVE DISC DISEASE 03/22/2007  . 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 RIGHT/LEFT HEART CATH AND CORONARY ANGIOGRAPHY N/A 06/12/2017   Procedure: RIGHT/LEFT HEART CATH AND CORONARY ANGIOGRAPHY;  Surgeon: Jolaine Artist, MD;  Location: [facility];  Service: Cardiovascular;  Laterality: N/A;        Home Medications    Prior to Admission medications   Medication Sig Start Date End Date Taking? Authorizing Provider  acetaminophen (TYLENOL) 500 MG tablet Take 1,500 mg by mouth 3 (three) times daily as needed for headache (pain).    Yes [provider]  albuterol (PROVENTIL HFA;VENTOLIN HFA) 108 (90 Base) MCG/ACT inhaler Inhale 1-2 puffs into the lungs every 6 (six) hours as needed for wheezing or shortness of breath. 11/02/17  Yes Khatri, Hina, PA-C  albuterol (PROVENTIL) (2.5 MG/3ML) 0.083% nebulizer solution Take 3 mLs (2.5 mg total) by nebulization every 6 (six) hours as needed for wheezing or shortness of breath. 11/02/17  Yes Khatri, Hina, PA-C  amiodarone (PACERONE) 200 MG tablet Take 1 tablet (200 mg total) by mouth daily. 07/13/17  Yes Clegg, Amy D, NP  carvedilol (COREG) 3.125 MG tablet Take 1 tablet (3.125 mg total) by mouth 2 (two) times daily with a meal. 07/24/17  Yes Clegg, Amy D, NP  fluticasone (FLONASE) 50 MCG/ACT nasal spray Place 1 spray into both nostrils daily as needed for allergies or rhinitis.   Yes [provider]  furosemide (LASIX) 20 MG tablet Take only as needed as  directed by the CHF clinic. Patient taking differently: Take 20 mg by mouth See admin instructions. Take one tablet (20 mg) by mouth twice daily - morning and afternoon as needed for weight gain of >3 lbs or  as directed by the CHF clinic. 09/03/17  Yes Shirley Friar, PA-C  ipratropium (ATROVENT) 0.02 % nebulizer solution Take 2.5 mLs (0.5 mg total) by nebulization 4 (four) times daily. 07/26/17  Yes Pisciotta, Elmyra Ricks, PA-C  isosorbide-hydrALAZINE (BIDIL) 20-37.5 MG tablet Take 2 tablets by mouth 3 (three) times daily. 08/07/17  Yes Clegg, Amy D, NP  losartan (COZAAR) 50 MG tablet Take 1 tablet (50 mg total) by mouth 2 (two) times daily. 09/30/17  Yes Bensimhon, Shaune Pascal, MD  pantoprazole (PROTONIX) 40 MG tablet Take 1 tablet (40 mg total) by mouth daily. 09/30/17  Yes Bensimhon, Shaune Pascal, MD  polyethylene glycol powder (GLYCOLAX/MIRALAX) powder Take 17 g by mouth daily as needed for mild constipation.   Yes [provider]    Family History Family History  Problem Relation Age of Onset  . Heart disease Father   . Heart disease Daughter        "fluid around heart"     Social History Social History   Tobacco Use  . Smoking status: Former Smoker    Packs/day: 0.10    Years: 45.00    Pack years: 4.50    Last attempt to quit: 2012    Years since quitting: 7.3  . Smokeless tobacco: Never Used  Substance Use Topics  . Alcohol use: No    Frequency: Never    Comment: Pt states no etoh since April 2014  . Drug use: No    Comment: Prior use of crack cocaine, quit 2012     Allergies   Lisinopril and Penicillins   Review of Systems Review of Systems  Constitutional: Negative.   HENT: Positive for congestion.   Respiratory: Positive for cough, shortness of breath and wheezing.   Cardiovascular: Negative.  Negative for chest pain and leg swelling.  Gastrointestinal: Negative.   Endocrine: Negative.   Genitourinary: Negative.   Musculoskeletal: Negative.   Skin:  Negative.   Allergic/Immunologic: Negative.   Neurological: Negative.   Hematological: Negative.   All other systems reviewed and are negative.    Physical Exam Updated Vital Signs BP (!) 177/88   Pulse (!) 44   Temp 97.8 F (36.6 C) (Oral)   Resp (!) 24   Ht 1.803 m (5\' 11" )   Wt 66.7 kg (147 lb)   SpO2 100%   BMI 20.50 kg/m   Physical Exam   ED Treatments / Results  Labs (all labs ordered are listed, but only abnormal results are displayed) Labs Reviewed  CBC  COMPREHENSIVE METABOLIC PANEL  BRAIN NATRIURETIC PEPTIDE    EKG EKG Interpretation  Date/Time:  Saturday Dec 05 2017 15:31:20 EDT Ventricular Rate:  62 PR Interval:    QRS Duration: 96 QT Interval:  504  QTC Calculation: 512 R Axis:   56 Text Interpretation:  Sinus rhythm Left ventricular hypertrophy Prolonged QT interval rate has decreased Confirmed by Pattricia Boss [phone]) on 12/05/2017 5:57:44 PM   Radiology Dg Chest 2 View  Result Date: 12/05/2017 CLINICAL DATA:  Asthma flare for the past 2 days. EXAM: CHEST - 2 VIEW COMPARISON:  11/02/2017; 08/30/2017; 07/26/2017; 10/16/2015; 07/12/2015 FINDINGS: Grossly unchanged cardiac silhouette and mediastinal contours. The lungs remain hyperexpanded with flattening the diaphragms and diffuse slightly nodular thickening pulmonary interstitium. No focal airspace opacities. No pleural effusion or pneumothorax no evidence of edema. No acute osseus abnormalities. Stigmata of DISH within thoracic spine. Unchanged mild (approximately 25%) compression deformities within midthoracic spine. Post cholecystectomy. IMPRESSION: Similar findings of lung hyperexpansion and bronchitic change without superimposed acute cardiopulmonary disease. Electronically Signed   By: Sandi Mariscal M.D.   On: 12/05/2017 13:39    Procedures Procedures (including critical care time)  Medications Ordered in ED Medications  albuterol (PROVENTIL) (2.5 MG/3ML) 0.083% nebulizer solution 5 mg (has no  administration in time range)  predniSONE (DELTASONE) tablet 40 mg (has no administration in time range)  albuterol (PROVENTIL) (2.5 MG/3ML) 0.083% nebulizer solution 5 mg (5 mg Nebulization Given 12/05/17 1300)     Initial Impression / Assessment and Plan / ED Course  I have reviewed the triage vital signs and the nursing notes.  Pertinent labs & imaging results that were available during my care of the patient were reviewed by me and considered in my medical decision making (see chart for details).   Patient with COPD and wheezing here today which has greatly improved with albuterol and prednisone.  Plan prednisone. BNP mildly elevated at 28.9 Renal insufficiency appears stable Final Clinical Impressions(s) / ED Diagnoses   Final diagnoses:  COPD exacerbation [facility])    ED Discharge Orders        Ordered    predniSONE (DELTASONE) 10 MG tablet  Daily     12/05/17 1756       Pattricia Boss, MD 12/05/17 Cleda Clarks, MD 12/05/17 [phone]

## 2017-12-05 NOTE — ED Notes (Addendum)
Pt ambulated in hallway on pulse ox with a steady gait. Pt started at 99% O2 and the lowest the pt's O2 got was at 93%. O2 saturation was up to 100% by the time the pt got back into the room. When asked how he was feeling while ambulating, he said he was "feeling a lot better than I was earlier."

## 2017-12-05 NOTE — Discharge Instructions (Signed)
Please take prednisone prescription as written.  Use your inhaler up to every 2-4 hours.  Return if you are worse at any time.  Recheck with your doctor in the next 1 to 2 days

## 2017-12-14 ENCOUNTER — Other Ambulatory Visit (HOSPITAL_COMMUNITY): Payer: Self-pay | Admitting: Adult Health

## 2017-12-16 ENCOUNTER — Telehealth (HOSPITAL_COMMUNITY): Payer: Self-pay

## 2017-12-16 MED ORDER — ISOSORB DINITRATE-HYDRALAZINE 20-37.5 MG PO TABS: 2.0000 | ORAL_TABLET | Freq: Three times a day (TID) | ORAL | 11 refills | Status: DC

## 2017-12-16 NOTE — Telephone Encounter (Signed)
Patient calling CHF clinic triage line for refills. Refills sent for Bidil to preferred pharmacy.  Renee Pain, RN

## 2018-01-22 ENCOUNTER — Other Ambulatory Visit (HOSPITAL_COMMUNITY): Payer: Self-pay | Admitting: Internal Medicine

## 2018-01-22 ENCOUNTER — Other Ambulatory Visit (HOSPITAL_COMMUNITY): Payer: Self-pay | Admitting: Adult Health

## 2018-02-12 ENCOUNTER — Other Ambulatory Visit (HOSPITAL_COMMUNITY): Payer: Self-pay | Admitting: Internal Medicine

## 2018-02-23 ENCOUNTER — Encounter (HOSPITAL_COMMUNITY): Payer: Self-pay

## 2018-02-23 ENCOUNTER — Ambulatory Visit (HOSPITAL_COMMUNITY)
Admission: EM | Admit: 2018-02-23 | Discharge: 2018-02-23 | Disposition: A | Discharge status: Home / Self Care | Payer: Medicaid Other | Attending: Emergency Medicine | Admitting: Emergency Medicine

## 2018-02-23 DIAGNOSIS — J069 Acute upper respiratory infection, unspecified: Secondary | ICD-10-CM | POA: Diagnosis not present

## 2018-02-23 DIAGNOSIS — J441 Chronic obstructive pulmonary disease with (acute) exacerbation: Secondary | ICD-10-CM | POA: Diagnosis not present

## 2018-02-23 MED ORDER — ALBUTEROL SULFATE HFA 108 (90 BASE) MCG/ACT IN AERS: 1.0000 | INHALATION_SPRAY | RESPIRATORY_TRACT | 0 refills | Status: DC | PRN

## 2018-02-23 MED ORDER — PREDNISONE 20 MG PO TABS: 40.0000 mg | ORAL_TABLET | Freq: Every day | ORAL | 0 refills | Status: AC

## 2018-02-23 MED ORDER — AEROCHAMBER PLUS MISC: 2 refills | Status: DC

## 2018-02-23 MED ORDER — ALBUTEROL SULFATE (2.5 MG/3ML) 0.083% IN NEBU: 2.5000 mg | INHALATION_SOLUTION | Freq: Four times a day (QID) | RESPIRATORY_TRACT | 0 refills | Status: DC | PRN

## 2018-02-23 MED ORDER — FLUTICASONE PROPIONATE 50 MCG/ACT NA SUSP: 2.0000 | Freq: Every day | NASAL | 0 refills | Status: DC

## 2018-02-23 NOTE — ED Triage Notes (Signed)
Pt presents with cold symptoms

## 2018-02-23 NOTE — Discharge Instructions (Addendum)
Start Flonase, saline nasal irrigation with a Milta Deiters med sinus rinse and distilled water, use this as often as you want.  Follow the directions on the box.-The prednisone, wait a day or 2 to start the antibiotics.  1 to 2 puffs from your albuterol inhaler or an albuterol nebulizer treatment every 4-6 hours as needed for coughing, wheezing, shortness of breath.

## 2018-02-23 NOTE — ED Provider Notes (Signed)
HPI  SUBJECTIVE:  Joe Garcia is a 67 y.o. male who presents with URI symptoms for the past 4 days getting worse.  Reports chills, nasal congestion, rhinorrhea, postnasal drip, sinus pain pressure.  He states that both of his ears feel stopped up and reports bialteral ear pain.  He reports a cough productive of yellow sputum which is new.  It has not changed in the amount of sputum.  He reports wheezing, shortness of breath, dyspnea on exertion.  No chest pain.  No fevers.  No change in hearing, otorrhea.  No upper dental pain.  No antibiotics in the past 3 months.  He has tried Tylenol, last dose was immediately prior to arrival, is using his albuterol inhaler "too much" up to 7-8 times a day.  Normally uses it once or twice a day.  He does not have a spacer.  States that he has run out of his albuterol nebs because he has been using it so much.  The Tylenol seems to help.  No aggravating factors.  He has a past medical history of COPD/asthma.  He is a former smoker.  He also has a history of CHF with a 25 to 30% EF.  He states that this does not feel like his CHF.  He denies lower extremity edema or other symptoms of fluid overload.  Also history of GERD, single kidney, chronic kidney disease stage II, hypertension.  No history of diabetes.  NWG:NFAOZHYQ, [facility]   Past Medical History:  Diagnosis Date  . Alcoholism ([place])   . Arthritis    hips, L shoulder  . Asthma   . CHF (congestive heart failure) ([place])   . Depression   . Family history of anesthesia complication   . Hyperlipidemia   . Hypertension     Past Surgical History:  Procedure Laterality Date  . RIGHT/LEFT HEART CATH AND CORONARY ANGIOGRAPHY N/A 06/12/2017   Procedure: RIGHT/LEFT HEART CATH AND CORONARY ANGIOGRAPHY;  Surgeon: Jolaine Artist, MD;  Location: [facility];  Service: Cardiovascular;  Laterality: N/A;    Family History  Problem Relation Age of Onset  . Heart disease Father   .  Heart disease Daughter        "fluid around heart"     Social History   Tobacco Use  . Smoking status: Former Smoker    Packs/day: 0.10    Years: 45.00    Pack years: 4.50    Last attempt to quit: 2012    Years since quitting: 7.5  . Smokeless tobacco: Never Used  Substance Use Topics  . Alcohol use: No    Frequency: Never    Comment: Pt states no etoh since April 2014  . Drug use: No    Comment: Prior use of crack cocaine, quit 2012    No current facility-administered medications for this encounter.   Current Outpatient Medications:  .  acetaminophen (TYLENOL) 500 MG tablet, Take 1,500 mg by mouth 3 (three) times daily as needed for headache (pain). , Disp: , Rfl:  .  albuterol (PROVENTIL HFA;VENTOLIN HFA) 108 (90 Base) MCG/ACT inhaler, Inhale 1-2 puffs into the lungs every 4 (four) hours as needed for wheezing or shortness of breath., Disp: 1 Inhaler, Rfl: 0 .  albuterol (PROVENTIL) (2.5 MG/3ML) 0.083% nebulizer solution, Take 3 mLs (2.5 mg total) by nebulization every 6 (six) hours as needed for wheezing or shortness of breath., Disp: 75 mL, Rfl: 0 .  amiodarone (PACERONE) 200 MG tablet,  Take 1 tablet (200 mg total) by mouth daily., Disp: 90 tablet, Rfl: 3 .  BIDIL 20-37.5 MG tablet, TAKE 2 TABLETS BY MOUTH THREE TIMES A DAY, Disp: 180 tablet, Rfl: 0 .  carvedilol (COREG) 3.125 MG tablet, TAKE 1 TABLET BY MOUTH TWICE DAILY WITH MEALS, Disp: 60 tablet, Rfl: 3 .  fluticasone (FLONASE) 50 MCG/ACT nasal spray, Place 2 sprays into both nostrils daily., Disp: 16 g, Rfl: 0 .  furosemide (LASIX) 20 MG tablet, Take only as needed as directed by the CHF clinic. (Patient taking differently: Take 20 mg by mouth See admin instructions. Take one tablet (20 mg) by mouth twice daily - morning and afternoon as needed for weight gain of >3 lbs or  as directed by the CHF clinic.), Disp: 90 tablet, Rfl: 3 .  ipratropium (ATROVENT) 0.02 % nebulizer solution, Take 2.5 mLs (0.5 mg total) by nebulization  4 (four) times daily., Disp: 75 mL, Rfl: 12 .  losartan (COZAAR) 50 MG tablet, TAKE 1 TABLET BY MOUTH TWICE A DAY, Disp: 60 tablet, Rfl: 3 .  pantoprazole (PROTONIX) 40 MG tablet, Take 1 tablet (40 mg total) by mouth daily., Disp: 30 tablet, Rfl: 11 .  polyethylene glycol powder (GLYCOLAX/MIRALAX) powder, Take 17 g by mouth daily as needed for mild constipation., Disp: , Rfl:  .  predniSONE (DELTASONE) 20 MG tablet, Take 2 tablets (40 mg total) by mouth daily with breakfast for 5 days., Disp: 10 tablet, Rfl: 0 .  Spacer/Aero-Holding Chambers (AEROCHAMBER PLUS) inhaler, Use as instructed, Disp: 1 each, Rfl: 2  Allergies  Allergen Reactions  . Lisinopril Swelling    Facial and tongue swelling 10/2012  . Penicillins Other (See Comments)    "Stomach pains" Has patient had a PCN reaction causing immediate rash, facial/tongue/throat swelling, SOB or lightheadedness with hypotension: Unk Has patient had a PCN reaction causing severe rash involving mucus membranes or skin necrosis: Unk Has patient had a PCN reaction that required hospitalization: Unk Has patient had a PCN reaction occurring within the last 10 years: No If all of the above answers are "NO", then may proceed with Cephalosporin use.      ROS  As noted in HPI.   Physical Exam  BP 129/74 (BP Location: Right Arm)   Pulse 82   Temp 99.4 F (37.4 C) (Oral)   Resp 20   SpO2 98%   Constitutional: Well developed, well nourished, no acute distress Eyes:  EOMI, conjunctiva normal bilaterally HENT: Normocephalic, atraumatic,mucus membranes moist. positive clear rhinorrhea.  Normal turbinates.  No sinus tenderness.  TMs obscured with cerumen bilaterally.  Normal oropharynx.  No postnasal drip. Respiratory: Normal inspiratory effort, poor air movement, wheezing throughout all lung fields.  No rales, rhonchi. Cardiovascular: Normal rate, regular rhythm, no murmurs, rubs, gallops GI: nondistended skin: No rash, skin  intact Musculoskeletal: no deformities, calves symmetric, nontender, no edema Neurologic: Alert & oriented x 3, no focal neuro deficits Psychiatric: Speech and behavior appropriate   ED Course   Medications - No data to display  No orders of the defined types were placed in this encounter.   No results found for this or any previous visit (from the past 24 hour(s)). No results found.  ED Clinical Impression  Upper respiratory tract infection, unspecified type  COPD exacerbation [facility])   ED Assessment/Plan  Patient with a URI triggering off an asthma/COPD exacerbation.  His last 3 kidney functions were normal this year.   Creatinine clearance 47.88.  No evidence of CHF.  Sending home with an albuterol inhaler with a spacer, refill his albuterol nebulizer, Flonase, prednisone 40 mg for 5 days.  Will send home with a wait-and-see prescription of azithromycin as patient can be considered to have a moderate COPD exacerbation with increased shortness of breath and increased sputum purulence.  He has has not been on antibiotics in the past 3 months.  No focal lung findings: No fevers, deferring chest x-ray today. He will follow-up with his primary care physician as needed, to the ER if he gets worse.  Discussed MDM, treatment plan, and plan for follow-up with patient. Discussed sn/sx that should prompt return to the ED. patient agrees with plan.   Meds ordered this encounter  Medications  . albuterol (PROVENTIL HFA;VENTOLIN HFA) 108 (90 Base) MCG/ACT inhaler    Sig: Inhale 1-2 puffs into the lungs every 4 (four) hours as needed for wheezing or shortness of breath.    Dispense:  1 Inhaler    Refill:  0  . albuterol (PROVENTIL) (2.5 MG/3ML) 0.083% nebulizer solution    Sig: Take 3 mLs (2.5 mg total) by nebulization every 6 (six) hours as needed for wheezing or shortness of breath.    Dispense:  75 mL    Refill:  0  . predniSONE (DELTASONE) 20 MG tablet    Sig: Take 2 tablets (40 mg  total) by mouth daily with breakfast for 5 days.    Dispense:  10 tablet    Refill:  0  . fluticasone (FLONASE) 50 MCG/ACT nasal spray    Sig: Place 2 sprays into both nostrils daily.    Dispense:  16 g    Refill:  0  . Spacer/Aero-Holding Chambers (AEROCHAMBER PLUS) inhaler    Sig: Use as instructed    Dispense:  1 each    Refill:  2    *This clinic note was created using Dragon dictation software. Therefore, there may be occasional mistakes despite careful proofreading.   ?   Melynda Ripple, MD 02/23/18 2200

## 2018-02-24 ENCOUNTER — Emergency Department (HOSPITAL_COMMUNITY)
Admission: EM | Admit: 2018-02-24 | Discharge: 2018-02-24 | Disposition: A | Discharge status: Home / Self Care | Payer: Medicaid Other | Attending: Emergency Medicine | Admitting: Emergency Medicine

## 2018-02-24 ENCOUNTER — Encounter (HOSPITAL_COMMUNITY): Payer: Self-pay

## 2018-02-24 ENCOUNTER — Emergency Department (HOSPITAL_COMMUNITY): Payer: Medicaid Other

## 2018-02-24 DIAGNOSIS — Z79899 Other long term (current) drug therapy: Secondary | ICD-10-CM | POA: Insufficient documentation

## 2018-02-24 DIAGNOSIS — I509 Heart failure, unspecified: Secondary | ICD-10-CM | POA: Diagnosis not present

## 2018-02-24 DIAGNOSIS — R062 Wheezing: Secondary | ICD-10-CM | POA: Diagnosis present

## 2018-02-24 DIAGNOSIS — Z87891 Personal history of nicotine dependence: Secondary | ICD-10-CM | POA: Insufficient documentation

## 2018-02-24 DIAGNOSIS — J441 Chronic obstructive pulmonary disease with (acute) exacerbation: Secondary | ICD-10-CM

## 2018-02-24 DIAGNOSIS — I110 Hypertensive heart disease with heart failure: Secondary | ICD-10-CM | POA: Diagnosis not present

## 2018-02-24 DIAGNOSIS — J449 Chronic obstructive pulmonary disease, unspecified: Secondary | ICD-10-CM | POA: Insufficient documentation

## 2018-02-24 LAB — CBC WITH DIFFERENTIAL/PLATELET
Abs Immature Granulocytes: 0 10*3/uL (ref 0.0–0.1)
Basophils Absolute: 0 10*3/uL (ref 0.0–0.1)
Basophils Relative: 0 %
EOS ABS: 0.1 10*3/uL (ref 0.0–0.7)
EOS PCT: 1 %
HCT: 42 % (ref 39.0–52.0)
HEMOGLOBIN: 13.7 g/dL (ref 13.0–17.0)
Immature Granulocytes: 0 %
LYMPHS PCT: 26 %
Lymphs Abs: 2.1 10*3/uL (ref 0.7–4.0)
MCH: 27.7 pg (ref 26.0–34.0)
MCHC: 32.6 g/dL (ref 30.0–36.0)
MCV: 85 fL (ref 78.0–100.0)
MONOS PCT: 7 %
Monocytes Absolute: 0.6 10*3/uL (ref 0.1–1.0)
Neutro Abs: 5.3 10*3/uL (ref 1.7–7.7)
Neutrophils Relative %: 66 %
Platelets: 223 10*3/uL (ref 150–400)
RBC: 4.94 MIL/uL (ref 4.22–5.81)
RDW: 15 % (ref 11.5–15.5)
WBC: 8.2 10*3/uL (ref 4.0–10.5)

## 2018-02-24 LAB — BASIC METABOLIC PANEL
ANION GAP: 11 (ref 5–15)
BUN: 13 mg/dL (ref 8–23)
CALCIUM: 9.1 mg/dL (ref 8.9–10.3)
CHLORIDE: 104 mmol/L (ref 98–111)
CO2: 22 mmol/L (ref 22–32)
CREATININE: 1.48 mg/dL — AB (ref 0.61–1.24)
GFR calc non Af Amer: 48 mL/min — ABNORMAL LOW (ref >60)
GFR, EST AFRICAN AMERICAN: 56 mL/min — AB (ref >60)
GLUCOSE: 137 mg/dL — AB (ref 70–99)
Potassium: 3.9 mmol/L (ref 3.5–5.1)
Sodium: 137 mmol/L (ref 135–145)

## 2018-02-24 MED ORDER — ALBUTEROL SULFATE HFA 108 (90 BASE) MCG/ACT IN AERS
1.0000 | INHALATION_SPRAY | RESPIRATORY_TRACT | Status: DC | PRN
  Administered 2018-02-24: 1 via RESPIRATORY_TRACT
  Filled 2018-02-24: qty 6.7

## 2018-02-24 MED ORDER — SODIUM CHLORIDE 0.9 % IV BOLUS
500.0000 mL | Freq: Once | INTRAVENOUS | Status: AC
  Administered 2018-02-24: 500 mL via INTRAVENOUS

## 2018-02-24 MED ORDER — IPRATROPIUM-ALBUTEROL 0.5-2.5 (3) MG/3ML IN SOLN
3.0000 mL | Freq: Once | RESPIRATORY_TRACT | Status: AC
  Administered 2018-02-24: 3 mL via RESPIRATORY_TRACT
  Filled 2018-02-24: qty 3

## 2018-02-24 MED ORDER — ALBUTEROL (5 MG/ML) CONTINUOUS INHALATION SOLN
10.0000 mg/h | INHALATION_SOLUTION | Freq: Once | RESPIRATORY_TRACT | Status: AC
  Administered 2018-02-24: 10 mg/h via RESPIRATORY_TRACT
  Filled 2018-02-24: qty 20

## 2018-02-24 MED ORDER — ALBUTEROL SULFATE (2.5 MG/3ML) 0.083% IN NEBU: 5.0000 mg | INHALATION_SOLUTION | Freq: Once | RESPIRATORY_TRACT | Status: DC

## 2018-02-24 NOTE — ED Triage Notes (Signed)
Pt. Arrived by EMS from home with reports of shob X1 week. Pt. Reports feeling sick for the past week. Pt. 92 on RA. Pt. Given 10 of albuterol, 1 Atrovent, 125 of solumedrol and 4 of Zofran en route. A/O X4

## 2018-02-24 NOTE — ED Provider Notes (Signed)
Patient presented to the ER with shortness of breath.  Patient with history of COPD, has been experiencing cough, congestion, increased shortness of breath.  He has been using albuterol frequently without improvement.  Face to face Exam: HEENT - PERRLA Lungs -diminished bilaterally, wheezing Heart - RRR, no M/R/G Abd - S/NT/ND Neuro - alert, oriented x3  Plan: Patient was seen in urgent care yesterday, did not pick up his medications.  Patient with significant bronchospasm during EMS transport, administer continuous nebulizer treatment, perform work-up.  If patient does not improve, admit to hospital.   Orpah Greek, MD 02/24/18 [phone]

## 2018-02-24 NOTE — ED Provider Notes (Signed)
[facility] EMERGENCY DEPARTMENT Provider Note   CSN: [id number] Arrival date & time: 02/24/18  0545     History   Chief Complaint No chief complaint on file.   HPI Joe Garcia is a 67 y.o. male.  HPI  Joe Garcia is a 67 year old male with history of COPD (no home O2), CHF (EF 40 to 45%), hypertension, hyperlipidemia who presents to the emergency department for evaluation of wheezing, cough, shortness of breath, congestion.  Patient reports that he started feeling sick 3 days ago with a head cold.  States that he is felt very congested and that his secretions are running into his lungs.  He has had wheezing and shortness of breath needing to use his albuterol inhaler every 10 to 15 minutes at home.  He states he normally uses the albuterol inhaler twice a day when he is feeling well.  Reports that he has a productive cough of yellow sputum.  States that he feels dyspneic upon exertion.  He has had intermittent tactile fevers and chills.  Was seen at urgent care yesterday and prescribed prednisone and azithromycin, but was unable to fill these prescriptions at the pharmacy because he did not have a ride.  States that he feels worse than he did yesterday and feels very weak and dehydrated.  He denies leg swelling or symptoms of fluid overload.  He denies sick contacts with similar.  Also reporting that he feels nauseated and has vomited several times.  Denies ear pain, sore throat, headache or neck stiffness, chest pain, abdominal pain, diarrhea, dysuria, syncope.  Per EMS report he received 10 mg albuterol, 1 Atrovent, 125 Solu-Medrol and 4 mg of Zofran in route.  He states that he feels somewhat improved, but continues to wheeze.  Past Medical History:  Diagnosis Date  . Alcoholism ([place])   . Arthritis    hips, L shoulder  . Asthma   . CHF (congestive heart failure) ([place])   . Depression   . Family history of anesthesia complication   . Hyperlipidemia   .  Hypertension     Patient Active Problem List   Diagnosis Date Noted  . Malnutrition of moderate degree 07/16/2017  . Asthma exacerbation 07/14/2017  . Acute respiratory distress 07/14/2017  . 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 Solitary kidney, congenital 06/23/2017  . AKI (acute kidney injury) ([place]) 06/17/2017  . Epigastric pain 06/17/2017  . Elevated troponin 06/17/2017  . Nonischemic cardiomyopathy ([place])   . Multifocal PVCs   . Chronic combined systolic (congestive) and diastolic (congestive) heart failure ([place]) 06/11/2017  . Influenza A 07/17/2013  . Influenza 07/16/2013  . Fever 07/16/2013  . Tachycardia 07/16/2013  . Alcohol abuse 09/25/2012  . Alcohol dependence ([place]) 09/22/2012  . Essential hypertension 03/22/2007  . DEGENERATIVE DISC DISEASE 03/22/2007  . 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 RIGHT/LEFT HEART CATH AND CORONARY ANGIOGRAPHY N/A 06/12/2017   Procedure: RIGHT/LEFT HEART CATH AND CORONARY ANGIOGRAPHY;  Surgeon: Jolaine Artist, MD;  Location: [facility];  Service: Cardiovascular;  Laterality: N/A;        Home Medications    Prior to Admission medications   Medication Sig Start Date End Date Taking? Authorizing Provider  acetaminophen (TYLENOL) 500 MG tablet Take 1,500 mg by mouth 3 (three) times daily as needed for headache (pain).     [provider]  albuterol (PROVENTIL HFA;VENTOLIN HFA) 108 (90 Base) MCG/ACT inhaler  Inhale 1-2 puffs into the lungs every 4 (four) hours as needed for wheezing or shortness of breath. 02/23/18   Melynda Ripple, MD  albuterol (PROVENTIL) (2.5 MG/3ML) 0.083% nebulizer solution Take 3 mLs (2.5 mg total) by nebulization every 6 (six) hours as needed for wheezing or shortness of breath. 02/23/18   Melynda Ripple, MD  amiodarone (PACERONE) 200 MG tablet Take 1 tablet (200 mg total) by mouth daily. 07/13/17   Clegg, Amy D, NP  BIDIL  20-37.5 MG tablet TAKE 2 TABLETS BY MOUTH THREE TIMES A DAY 01/22/18   Bensimhon, Shaune Pascal, MD  carvedilol (COREG) 3.125 MG tablet TAKE 1 TABLET BY MOUTH TWICE DAILY WITH MEALS 02/15/18   Bensimhon, Shaune Pascal, MD  fluticasone (FLONASE) 50 MCG/ACT nasal spray Place 2 sprays into both nostrils daily. 02/23/18   Melynda Ripple, MD  furosemide (LASIX) 20 MG tablet Take only as needed as directed by the CHF clinic. Patient taking differently: Take 20 mg by mouth See admin instructions. Take one tablet (20 mg) by mouth twice daily - morning and afternoon as needed for weight gain of >3 lbs or  as directed by the CHF clinic. 09/03/17   Shirley Friar, PA-C  ipratropium (ATROVENT) 0.02 % nebulizer solution Take 2.5 mLs (0.5 mg total) by nebulization 4 (four) times daily. 07/26/17   Pisciotta, Elmyra Ricks, PA-C  losartan (COZAAR) 50 MG tablet TAKE 1 TABLET BY MOUTH TWICE A DAY 02/15/18   Bensimhon, Shaune Pascal, MD  pantoprazole (PROTONIX) 40 MG tablet Take 1 tablet (40 mg total) by mouth daily. 09/30/17   Bensimhon, Shaune Pascal, MD  polyethylene glycol powder (GLYCOLAX/MIRALAX) powder Take 17 g by mouth daily as needed for mild constipation.    [provider]  predniSONE (DELTASONE) 20 MG tablet Take 2 tablets (40 mg total) by mouth daily with breakfast for 5 days. 02/23/18 02/28/18  Melynda Ripple, MD  Spacer/Aero-Holding Chambers (AEROCHAMBER PLUS) inhaler Use as instructed 02/23/18   Melynda Ripple, MD    Family History Family History  Problem Relation Age of Onset  . Heart disease Father   . Heart disease Daughter        "fluid around heart"     Social History Social History   Tobacco Use  . Smoking status: Former Smoker    Packs/day: 0.10    Years: 45.00    Pack years: 4.50    Last attempt to quit: 2012    Years since quitting: 7.5  . Smokeless tobacco: Never Used  Substance Use Topics  . Alcohol use: No    Frequency: Never    Comment: Pt states no etoh since April 2014  . Drug  use: No    Comment: Prior use of crack cocaine, quit 2012     Allergies   Lisinopril and Penicillins   Review of Systems Review of Systems  Constitutional: Negative for chills and fever.  HENT: Positive for congestion. Negative for ear pain, rhinorrhea and sore throat.   Respiratory: Positive for cough, shortness of breath and wheezing.   Cardiovascular: Negative for chest pain.  Gastrointestinal: Positive for nausea and vomiting. Negative for abdominal pain.  Genitourinary: Negative for difficulty urinating and dysuria.  Musculoskeletal: Negative for neck stiffness.  Skin: Negative for rash.  Neurological: Positive for weakness. Negative for syncope, light-headedness and headaches.  Psychiatric/Behavioral: Negative for agitation.     Physical Exam Updated Vital Signs BP 130/68   Pulse 62   Resp (!) 23   Ht 5\' 11"  (1.803 m)  Wt 68.9 kg (152 lb)   SpO2 98%   BMI 21.20 kg/m   Physical Exam  Constitutional: He is oriented to person, place, and time. He appears well-developed and well-nourished. No distress.  Curled up at bedside, speaks in quiet voice. No acute distress.   HENT:  Head: Normocephalic and atraumatic.  No maxillary or frontal sinus tenderness. Unable to view TM due to cerumen buildup. Normal ear canal, no mastoid tenderness.  Mucous membranes moist.  Posterior oropharynx without erythema.  No tonsillar exudate.  Eyes: Pupils are equal, round, and reactive to light. Conjunctivae are normal. Right eye exhibits no discharge. Left eye exhibits no discharge.  Neck: Normal range of motion. Neck supple.  No nuchal rigidity.  No JVD.  Cardiovascular: Normal rate and regular rhythm.  Pulmonary/Chest: Effort normal. No respiratory distress.  No respiratory distress.  Speaking in full sentences.  Diffuse inspiratory and expiratory wheezing in bilateral upper and lower lung fields.  Abdominal: Soft. Bowel sounds are normal. There is no tenderness.  Musculoskeletal:  Normal range of motion.  Neurological: He is alert and oriented to person, place, and time. Coordination normal.  Skin: Skin is warm and dry. He is not diaphoretic.  Psychiatric: He has a normal mood and affect. His behavior is normal.  Nursing note and vitals reviewed.    ED Treatments / Results  Labs (all labs ordered are listed, but only abnormal results are displayed) Labs Reviewed  BASIC METABOLIC PANEL - Abnormal; Notable for the following components:      Result Value   Glucose, Bld 137 (*)    Creatinine, Ser 1.48 (*)    GFR calc non Af Amer 48 (*)    GFR calc Af Amer 56 (*)    All other components within normal limits  CBC WITH DIFFERENTIAL/PLATELET    EKG EKG Interpretation  Date/Time:  Wednesday February 24 2018 05:52:29 EDT Ventricular Rate:  65 PR Interval:    QRS Duration: 86 QT Interval:  474 QTC Calculation: 493 R Axis:   49 Text Interpretation:  Sinus rhythm Consider left atrial enlargement Minimal ST depression, inferior leads Borderline prolonged QT interval No significant change since last tracing Confirmed by Orpah Greek [phone]) on 02/24/2018 7:53:32 AM   Radiology Dg Chest 2 View  Result Date: 02/24/2018 CLINICAL DATA:  Shortness of breath, chills, vomiting EXAM: CHEST - 2 VIEW COMPARISON:  12/05/2017 FINDINGS: Heart and mediastinal contours are within normal limits. No focal opacities or effusions. No acute bony abnormality. IMPRESSION: No active cardiopulmonary disease. Electronically Signed   By: Rolm Baptise M.D.   On: 02/24/2018 07:07    Procedures Procedures (including critical care time)  Medications Ordered in ED Medications  albuterol (PROVENTIL,VENTOLIN) solution continuous neb (10 mg/hr Nebulization Given 02/24/18 0644)  sodium chloride 0.9 % bolus 500 mL (0 mLs Intravenous Stopped 02/24/18 0830)  ipratropium-albuterol (DUONEB) 0.5-2.5 (3) MG/3ML nebulizer solution 3 mL (3 mLs Nebulization Given 02/24/18 8453)     Initial  Impression / Assessment and Plan / ED Course  I have reviewed the triage vital signs and the nursing notes.  Pertinent labs & imaging results that were available during my care of the patient were reviewed by me and considered in my medical decision making (see chart for details).    Patient presents with increased wheezing, sputum production and shortness of breath over the past several days.  On exam he has diffuse inspiratory and expiratory wheezing.  Symptoms consistent with COPD exacerbation.  He received continuous albuterol  for an hour and Solu-Medrol.  He reports subsequent improvement of his symptoms and states he is breathing at baseline.  CBC without leukocytosis and CXR without evidence of pneumonia.  BMP unremarkable, creatinine at baseline.  Patient denies chest pain and EKG nonischemic, I do not suspect ACS given symptoms at this time.  On recheck his lung exam is improved and he has no more wheezing.  He is breathing comfortably on room air with O2 sat 100%.  Plan to discharge home with instructions to fill his prednisone and azithromycin at the pharmacy.  I think that azithromycin is appropriate this time given he reports increased sputum production.  Counseled him to follow-up with his PCP and also discussed reasons to return immediately to the ER.  He was discharged with an albuterol inhaler given he reports that he is out of this medication.  This was a shared visit with Dr. Betsey Holiday who also saw the patient and agrees with plan.  Final Clinical Impressions(s) / ED Diagnoses   Final diagnoses:  COPD exacerbation [facility])    ED Discharge Orders    None       Glyn Ade, PA-C 02/24/18 1759    Orpah Greek, MD 02/25/18 [phone]

## 2018-02-24 NOTE — Discharge Instructions (Signed)
Please take albuterol inhaler as needed for wheezing.  Start taking prednisone for the next 5 days, you have a prescription for this at the pharmacy.  You also have a prescription for an antibiotic called azithromycin, please take this as well.  Try to sleep hydrated drinking plenty of fluids at home.  Return to the ER if you have any new or concerning symptoms like breathing that does not improve with inhaler, chest pain, vomiting that will not stop or fever greater than 100.4 F.

## 2018-02-24 NOTE — ED Notes (Signed)
Patient ambulated in hallway maintaining oxygen saturation of 95% or greater.

## 2018-02-24 NOTE — ED Notes (Addendum)
Pt. To XRAY via stretcher. 

## 2018-02-24 NOTE — Progress Notes (Signed)
RT called to C20 to start CAT.  Patient in X-ray, started CAT upon return.  Patient was using accessory muscles, tachyneic (rate of 26), complaining of bad headache.  RT started CAT, patient seems to be tolerating well.

## 2018-02-24 NOTE — ED Notes (Signed)
Respiratory called

## 2018-03-13 ENCOUNTER — Telehealth: Payer: Self-pay | Admitting: Cardiology

## 2018-03-13 NOTE — Telephone Encounter (Signed)
Pt called. One of his medications is making him weak after he takes it. He thinks it the BiDil. I told him to stop this for now and we'll arrange for him to be seen in the office.  Kerin Ransom PA-C 03/13/2018 11:22 AM

## 2018-03-15 ENCOUNTER — Telehealth (HOSPITAL_COMMUNITY): Payer: Self-pay | Admitting: Internal Medicine

## 2018-03-15 NOTE — Telephone Encounter (Signed)
Called patient and left message to call our office to schedule appt with APP side of Clinic regarding medication issues.  This was done per staff message from [facility], [place], PA-C [facility])

## 2018-03-17 NOTE — Telephone Encounter (Signed)
Called patient and reached family member/friend who gave me another number to try to reach patient.  Called patient [phone] and left message to call back to schedule appt.

## 2018-03-22 ENCOUNTER — Telehealth (HOSPITAL_COMMUNITY): Payer: Self-pay | Admitting: Internal Medicine

## 2018-03-22 ENCOUNTER — Encounter (HOSPITAL_COMMUNITY): Payer: Self-pay | Admitting: Internal Medicine

## 2018-03-22 NOTE — Telephone Encounter (Signed)
Per staff message from Kevan Rosebush, RN called patient and left messages for hime to call the office to schedule an appointment without any response.  Letter mailed to patient asking him to contact our office to schedule f/u appt as soon as possible.  Can be scheduled with APP side or with Dr. [name] for this appt.

## 2018-05-17 ENCOUNTER — Encounter (HOSPITAL_COMMUNITY): Payer: Self-pay | Admitting: Internal Medicine

## 2018-05-17 ENCOUNTER — Other Ambulatory Visit: Payer: Self-pay

## 2018-05-17 ENCOUNTER — Ambulatory Visit (HOSPITAL_COMMUNITY)
Admission: RE | Admit: 2018-05-17 | Discharge: 2018-05-17 | Disposition: A | Discharge status: Home / Self Care | Payer: Medicaid Other | Source: Ambulatory Visit | Attending: Internal Medicine | Admitting: Internal Medicine

## 2018-05-17 VITALS — BP 164/108 | HR 64 | Wt 157.4 lb

## 2018-05-17 DIAGNOSIS — I493 Ventricular premature depolarization: Secondary | ICD-10-CM

## 2018-05-17 DIAGNOSIS — I5042 Chronic combined systolic (congestive) and diastolic (congestive) heart failure: Secondary | ICD-10-CM

## 2018-05-17 DIAGNOSIS — M160 Bilateral primary osteoarthritis of hip: Secondary | ICD-10-CM | POA: Insufficient documentation

## 2018-05-17 DIAGNOSIS — I428 Other cardiomyopathies: Secondary | ICD-10-CM | POA: Insufficient documentation

## 2018-05-17 DIAGNOSIS — F329 Major depressive disorder, single episode, unspecified: Secondary | ICD-10-CM | POA: Insufficient documentation

## 2018-05-17 DIAGNOSIS — Q600 Renal agenesis, unilateral: Secondary | ICD-10-CM | POA: Insufficient documentation

## 2018-05-17 DIAGNOSIS — Z87891 Personal history of nicotine dependence: Secondary | ICD-10-CM | POA: Insufficient documentation

## 2018-05-17 DIAGNOSIS — I5022 Chronic systolic (congestive) heart failure: Secondary | ICD-10-CM | POA: Insufficient documentation

## 2018-05-17 DIAGNOSIS — Z79899 Other long term (current) drug therapy: Secondary | ICD-10-CM | POA: Diagnosis not present

## 2018-05-17 DIAGNOSIS — Z8249 Family history of ischemic heart disease and other diseases of the circulatory system: Secondary | ICD-10-CM | POA: Insufficient documentation

## 2018-05-17 DIAGNOSIS — J45909 Unspecified asthma, uncomplicated: Secondary | ICD-10-CM | POA: Diagnosis not present

## 2018-05-17 DIAGNOSIS — Z550 Illiteracy and low-level literacy: Secondary | ICD-10-CM | POA: Insufficient documentation

## 2018-05-17 DIAGNOSIS — I10 Essential (primary) hypertension: Secondary | ICD-10-CM

## 2018-05-17 DIAGNOSIS — Z888 Allergy status to other drugs, medicaments and biological substances status: Secondary | ICD-10-CM | POA: Insufficient documentation

## 2018-05-17 DIAGNOSIS — M19012 Primary osteoarthritis, left shoulder: Secondary | ICD-10-CM | POA: Diagnosis not present

## 2018-05-17 DIAGNOSIS — F1010 Alcohol abuse, uncomplicated: Secondary | ICD-10-CM

## 2018-05-17 DIAGNOSIS — Z880 Allergy status to penicillin: Secondary | ICD-10-CM | POA: Insufficient documentation

## 2018-05-17 DIAGNOSIS — I110 Hypertensive heart disease with heart failure: Secondary | ICD-10-CM | POA: Insufficient documentation

## 2018-05-17 DIAGNOSIS — E785 Hyperlipidemia, unspecified: Secondary | ICD-10-CM | POA: Insufficient documentation

## 2018-05-17 DIAGNOSIS — N182 Chronic kidney disease, stage 2 (mild): Secondary | ICD-10-CM

## 2018-05-17 DIAGNOSIS — K219 Gastro-esophageal reflux disease without esophagitis: Secondary | ICD-10-CM | POA: Insufficient documentation

## 2018-05-17 LAB — BASIC METABOLIC PANEL
Anion gap: 7 (ref 5–15)
BUN: 13 mg/dL (ref 8–23)
CO2: 24 mmol/L (ref 22–32)
CREATININE: 1.35 mg/dL — AB (ref 0.61–1.24)
Calcium: 8.8 mg/dL — ABNORMAL LOW (ref 8.9–10.3)
Chloride: 110 mmol/L (ref 98–111)
GFR calc Af Amer: >60 mL/min (ref >60)
GFR, EST NON AFRICAN AMERICAN: 54 mL/min — AB (ref >60)
GLUCOSE: 107 mg/dL — AB (ref 70–99)
Potassium: 3.5 mmol/L (ref 3.5–5.1)
SODIUM: 141 mmol/L (ref 135–145)

## 2018-05-17 MED ORDER — ISOSORB DINITRATE-HYDRALAZINE 20-37.5 MG PO TABS: 1.0000 | ORAL_TABLET | Freq: Three times a day (TID) | ORAL | 3 refills | Status: DC

## 2018-05-17 NOTE — Progress Notes (Signed)
Advanced Heart Failure Clinic Note   Primary Cardiologist: Dr. Gwenlyn Found HF: Dr. [name]  PCP: None   HPI: Joe Garcia is a 67 y.o. 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 Former crack cocaine abuse.   Admitted 11/14 - 06/15/17 with CP and DOE. Received steroids and nebs. BNP minimally elevated on arrival. Troponin negative.  R/LHC below with normal cors and compensated hemodynamics. 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   Re-admitted 11/21-11/23/18 with cramping abdominal pain and GI symptoms. CT abdomen with no negative findings, treated as gastroenteritis. AKI noted with rise of creatinine from 1.0 -> 2.0 on admit. Improved to 1.7 at discharge. Pt states he never picked up [facility] after he left the hospital initially.   Seen in ED 09/09/17 with N/V and "chest pain". CP felt like his normal reflux. EKG with PVCs and NSR. Had been seen at home by paramedicine and told HR was in 30s, but normal in 60s on monitor. Likely undercounting due to PVCs.  Work up unremarkable.   Pr presents today for regular follow up. Breathing has been worse lately. Feels like a head cold is coming on over past 2 days. He was taking off Bidil since august with "lightheadedness" which has been much better. Not SOB with ADLs at baseline, but has been over the past 4-5 days. Weight at home ~ 150 lbs. Not compliant with diet, but limited by food stamps. His BP runs 130-140s at home. He graduated from [employer]. Denies exertional CP. No edema. No orthopnea.   ECHO 05/06/2017 EF 30-35% Grade IDD Echo 09/30/2017: EF 40-45%, grade 2 DD  Review of systems complete and found to be negative unless listed in HPI.    Past Medical History:  Diagnosis Date  . Alcoholism ([place])   . Arthritis    hips, L shoulder  . Asthma   . CHF (congestive heart failure) ([place])   . Depression   . Family history of anesthesia complication   . Hyperlipidemia   . Hypertension     Current Outpatient Medications  Medication Sig Dispense Refill  . acetaminophen (TYLENOL) 500 MG tablet Take 1,500 mg by mouth 3 (three) times daily as needed for headache (pain).     Marland Kitchen albuterol (PROVENTIL HFA;VENTOLIN HFA) 108 (90 Base) MCG/ACT inhaler Inhale 1-2 puffs into the lungs every 4 (four) hours as needed for wheezing or shortness of breath. 1 Inhaler 0  . albuterol (PROVENTIL) (2.5 MG/3ML) 0.083% nebulizer solution Take 3 mLs (2.5 mg total) by nebulization every 6 (six) hours as needed for wheezing or shortness of breath. 75 mL 0  . amiodarone (PACERONE) 200 MG tablet Take 1 tablet (200 mg total) by mouth daily. 90 tablet 3  . carvedilol (COREG) 3.125 MG tablet TAKE 1 TABLET BY MOUTH TWICE DAILY WITH MEALS 60 tablet 3  . fluticasone (FLONASE) 50 MCG/ACT nasal spray Place 2 sprays into both nostrils daily. 16 g 0  . furosemide (LASIX) 20 MG tablet Take only as needed as directed by the CHF clinic. 90 tablet 3  . losartan (COZAAR) 50 MG tablet TAKE 1 TABLET BY MOUTH TWICE A DAY 60 tablet 3  . pantoprazole (PROTONIX) 40 MG tablet Take 1 tablet (40 mg total) by mouth daily. 30 tablet 11  . polyethylene glycol powder (GLYCOLAX/MIRALAX) powder Take 17 g by mouth daily as needed for mild constipation.    Marland Kitchen Spacer/Aero-Holding Chambers (AEROCHAMBER PLUS) inhaler Use as instructed 1 each 2  No current facility-administered medications for this encounter.    Allergies  Allergen Reactions  . Lisinopril Swelling    Facial and tongue swelling 10/2012  . Penicillins Other (See Comments)    "Stomach pains" Has patient had a PCN reaction causing immediate rash, facial/tongue/throat swelling, SOB or lightheadedness with hypotension: Unk Has patient had a PCN reaction causing severe rash involving mucus membranes or skin necrosis: Unk Has patient had a PCN reaction that required hospitalization: Unk Has patient had a PCN reaction occurring within the last 10 years: No If all of the above  answers are "NO", then may proceed with Cephalosporin use.    Social History   Socioeconomic History  . Marital status: Single    Spouse name: Not on file  . Number of children: Not on file  . Years of education: Not on file  . Highest education level: Not on file  Occupational History  . Occupation: "it don't work for me", on disability  Social Needs  . Financial resource strain: Not on file  . Food insecurity:    Worry: Not on file    Inability: Not on file  . Transportation needs:    Medical: Not on file    Non-medical: Not on file  Tobacco Use  . Smoking status: Former Smoker    Packs/day: 0.10    Years: 45.00    Pack years: 4.50    Last attempt to quit: 2012    Years since quitting: 7.8  . Smokeless tobacco: Never Used  Substance and Sexual Activity  . Alcohol use: No    Frequency: Never    Comment: Pt states no etoh since April 2014  . Drug use: No    Comment: Prior use of crack cocaine, quit 2012  . Sexual activity: Never  Lifestyle  . Physical activity:    Days per week: Not on file    Minutes per session: Not on file  . Stress: Not on file  Relationships  . Social connections:    Talks on phone: Not on file    Gets together: Not on file    Attends religious service: Not on file    Active member of club or organization: Not on file    Attends meetings of clubs or organizations: Not on file    Relationship status: Not on file  . Intimate partner violence:    Fear of current or ex partner: Not on file    Emotionally abused: Not on file    Physically abused: Not on file    Forced sexual activity: Not on file  Other Topics Concern  . Not on file  Social History Narrative  . Not on file    Family History  Problem Relation Age of Onset  . Heart disease Father   . Heart disease Daughter        "fluid around heart"    Vitals:   05/17/18 1001  BP: (!) 180/102  Pulse: 64  SpO2: 100%  Weight: 71.4 kg (157 lb 6.4 oz)   Wt Readings from Last 3  Encounters:  05/17/18 71.4 kg (157 lb 6.4 oz)  02/24/18 68.9 kg (152 lb)  12/05/17 66.7 kg (147 lb)   PHYSICAL EXAM: General: NAD  HEENT: Normal x for poor dentition.  Neck: Supple. JVP 6-7 cm. Carotids 2+ bilat; no bruits. No thyromegaly or nodule noted. Cor: PMI nondisplaced. RRR, No M/G/R noted Lungs: Diminished throughout. No wheeze.  Abdomen: Soft, non-tender, non-distended, no HSM. No bruits or masses. +  BS  Extremities: No cyanosis, clubbing, or rash. Warm.  Neuro: Alert & orientedx3, cranial nerves grossly intact. moves all 4 extremities w/o difficulty. Affect pleasant   ASSESSMENT & PLAN: 1. Chronic Systolic Heart Failure. ECHO 05/06/2017 EF 30-35%. NICM. 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 Well compensated hemodynamics.  - NYHA III symptoms chronically - Volume status stable on exam. Continue lasix prn.   - Continue coreg 3.125 mg BID.  - Resume Bidil 1 tab TID - Continue losartan to 50 mg BID. BMET today.  - He refuses entresto due to solitary kidney.  - Reinforced fluid restriction to < 2 L daily, sodium restriction to less than 2000 mg daily, and the importance of daily weights.    2. PVCs - Stable. Continue amio to 200 mg daily. No change.  3. HTN - Uncontrolled.  - Goal SBP < 130. - Meds as above.  4. Solitary Kidney, Congenital - Follow renal function carefully with med adjustments.  - BMET today.  5. Illiteracy - He can not read pill bottles. Graduated paramedicine.  - Has gotten better at taking his pills.  6. Asthma - per PCP.  - No wheeze on exam.  7. GERD - Continue protonix 40 mg daily 8. ED - Cannot do viagra with bidil - Per urology.   Meds as above. RTC 2-3 weeks for med titration in Pharm D clinic. RTC 3 months otherwise, sooner with symptoms. Labs today.   Shirley Friar, PA-C   Greater than 50% of the 25 minute visit was spent in counseling/coordination of care regarding disease state education,  salt/fluid restriction, sliding scale diuretics, and medication compliance.

## 2018-05-17 NOTE — Patient Instructions (Signed)
Start Bidil 1 tab Three times a day   Labs done today  Please follow up with Doroteo Bradford, our heart failure pharmacist in 2-3 weeks  Your physician recommends that you schedule a follow-up appointment in: 3 months

## 2018-05-24 ENCOUNTER — Emergency Department (HOSPITAL_COMMUNITY)
Admission: EM | Admit: 2018-05-24 | Discharge: 2018-05-24 | Disposition: A | Discharge status: Home / Self Care | Payer: Medicaid Other | Attending: Emergency Medicine | Admitting: Emergency Medicine

## 2018-05-24 ENCOUNTER — Encounter (HOSPITAL_COMMUNITY): Payer: Self-pay | Admitting: *Deleted

## 2018-05-24 ENCOUNTER — Emergency Department (HOSPITAL_COMMUNITY): Payer: Medicaid Other

## 2018-05-24 DIAGNOSIS — Z87891 Personal history of nicotine dependence: Secondary | ICD-10-CM | POA: Diagnosis not present

## 2018-05-24 DIAGNOSIS — N181 Chronic kidney disease, stage 1: Secondary | ICD-10-CM | POA: Diagnosis not present

## 2018-05-24 DIAGNOSIS — R0602 Shortness of breath: Secondary | ICD-10-CM | POA: Diagnosis present

## 2018-05-24 DIAGNOSIS — J441 Chronic obstructive pulmonary disease with (acute) exacerbation: Secondary | ICD-10-CM | POA: Diagnosis not present

## 2018-05-24 DIAGNOSIS — I130 Hypertensive heart and chronic kidney disease with heart failure and stage 1 through stage 4 chronic kidney disease, or unspecified chronic kidney disease: Secondary | ICD-10-CM | POA: Diagnosis not present

## 2018-05-24 DIAGNOSIS — I5042 Chronic combined systolic (congestive) and diastolic (congestive) heart failure: Secondary | ICD-10-CM | POA: Insufficient documentation

## 2018-05-24 DIAGNOSIS — J45909 Unspecified asthma, uncomplicated: Secondary | ICD-10-CM | POA: Insufficient documentation

## 2018-05-24 DIAGNOSIS — Z79899 Other long term (current) drug therapy: Secondary | ICD-10-CM | POA: Diagnosis not present

## 2018-05-24 LAB — I-STAT TROPONIN, ED: TROPONIN I, POC: 0 ng/mL (ref 0.00–0.08)

## 2018-05-24 LAB — CBC
HCT: 41.2 % (ref 39.0–52.0)
HEMOGLOBIN: 12.4 g/dL — AB (ref 13.0–17.0)
MCH: 25.6 pg — AB (ref 26.0–34.0)
MCHC: 30.1 g/dL (ref 30.0–36.0)
MCV: 84.9 fL (ref 80.0–100.0)
Platelets: 268 10*3/uL (ref 150–400)
RBC: 4.85 MIL/uL (ref 4.22–5.81)
RDW: 15 % (ref 11.5–15.5)
WBC: 6.7 10*3/uL (ref 4.0–10.5)
nRBC: 0 % (ref 0.0–0.2)

## 2018-05-24 LAB — BASIC METABOLIC PANEL
Anion gap: 9 (ref 5–15)
BUN: 17 mg/dL (ref 8–23)
CHLORIDE: 106 mmol/L (ref 98–111)
CO2: 25 mmol/L (ref 22–32)
CREATININE: 1.53 mg/dL — AB (ref 0.61–1.24)
Calcium: 9 mg/dL (ref 8.9–10.3)
GFR calc Af Amer: 54 mL/min — ABNORMAL LOW (ref >60)
GFR calc non Af Amer: 46 mL/min — ABNORMAL LOW (ref >60)
GLUCOSE: 86 mg/dL (ref 70–99)
POTASSIUM: 3.8 mmol/L (ref 3.5–5.1)
Sodium: 140 mmol/L (ref 135–145)

## 2018-05-24 MED ORDER — DOXYCYCLINE HYCLATE 100 MG PO TABS
100.0000 mg | ORAL_TABLET | Freq: Once | ORAL | Status: AC
  Administered 2018-05-24: 100 mg via ORAL
  Filled 2018-05-24: qty 1

## 2018-05-24 MED ORDER — IPRATROPIUM-ALBUTEROL 0.5-2.5 (3) MG/3ML IN SOLN
3.0000 mL | Freq: Once | RESPIRATORY_TRACT | Status: AC
  Administered 2018-05-24: 3 mL via RESPIRATORY_TRACT
  Filled 2018-05-24: qty 3

## 2018-05-24 MED ORDER — PREDNISONE 20 MG PO TABS
60.0000 mg | ORAL_TABLET | Freq: Once | ORAL | Status: AC
  Administered 2018-05-24: 60 mg via ORAL
  Filled 2018-05-24: qty 3

## 2018-05-24 MED ORDER — IPRATROPIUM BROMIDE 0.02 % IN SOLN
0.5000 mg | Freq: Once | RESPIRATORY_TRACT | Status: AC
  Administered 2018-05-24: 0.5 mg via RESPIRATORY_TRACT
  Filled 2018-05-24: qty 2.5

## 2018-05-24 MED ORDER — PREDNISONE 20 MG PO TABS: 40.0000 mg | ORAL_TABLET | Freq: Every day | ORAL | 0 refills | Status: DC

## 2018-05-24 MED ORDER — ALBUTEROL SULFATE (2.5 MG/3ML) 0.083% IN NEBU
2.5000 mg | INHALATION_SOLUTION | Freq: Once | RESPIRATORY_TRACT | Status: AC
  Administered 2018-05-24: 2.5 mg via RESPIRATORY_TRACT
  Filled 2018-05-24: qty 3

## 2018-05-24 MED ORDER — ALBUTEROL SULFATE HFA 108 (90 BASE) MCG/ACT IN AERS
2.0000 | INHALATION_SPRAY | RESPIRATORY_TRACT | Status: DC | PRN
  Administered 2018-05-24: 2 via RESPIRATORY_TRACT
  Filled 2018-05-24: qty 6.7

## 2018-05-24 MED ORDER — ALBUTEROL SULFATE (2.5 MG/3ML) 0.083% IN NEBU
5.0000 mg | INHALATION_SOLUTION | Freq: Once | RESPIRATORY_TRACT | Status: AC
  Administered 2018-05-24: 5 mg via RESPIRATORY_TRACT
  Filled 2018-05-24: qty 6

## 2018-05-24 MED ORDER — ONDANSETRON 4 MG PO TBDP
4.0000 mg | ORAL_TABLET | Freq: Once | ORAL | Status: AC
  Administered 2018-05-24: 4 mg via ORAL
  Filled 2018-05-24: qty 1

## 2018-05-24 MED ORDER — DOXYCYCLINE HYCLATE 100 MG PO CAPS: 100.0000 mg | ORAL_CAPSULE | Freq: Two times a day (BID) | ORAL | 0 refills | Status: DC

## 2018-05-24 NOTE — ED Provider Notes (Signed)
Patient placed in Quick Look pathway, seen and evaluated   Chief Complaint: Shortness of breath  HPI:   Patient reports shortness of breath over the past few days that has been gradually worsening.  He reports that at rest she is okay however it is mostly whenever he breathes.  ROS: No fever, cough present  Physical Exam:   Gen: No distress  Neuro: Awake and Alert  Skin: Warm    Focused Exam: Lungs with faint expiratory wheezes diffusely.  Frequently coughing up phlegm.   Initiation of care has begun. The patient has been counseled on the process, plan, and necessity for staying for the completion/evaluation, and the remainder of the medical screening examination    Ollen Gross 05/24/18 1620    Gareth Morgan, MD 05/26/18 2145

## 2018-05-24 NOTE — Discharge Instructions (Signed)
Please read and follow all provided instructions.  Your diagnoses today include:  1. COPD exacerbation ([place])     Tests performed today include:  Chest x-ray - did not show pneumonia  Blood counts and electrolytes  Vital signs. See below for your results today.   Medications prescribed:   Prednisone - steroid medicine   It is best to take this medication in the morning to prevent sleeping problems. If you are diabetic, monitor your blood sugar closely and stop taking Prednisone if blood sugar is over 300. Take with food to prevent stomach upset.    Doxycycline - antibiotic  You have been prescribed an antibiotic medicine: take the entire course of medicine even if you are feeling better. Stopping early can cause the antibiotic not to work.  Take any prescribed medications only as directed.  Home care instructions:  Follow any educational materials contained in this packet.  Follow-up instructions: Please follow-up with your primary care provider in the next 3 days for further evaluation of your symptoms and management of your COPD.   Return instructions:   Please return to the Emergency Department if you experience worsening symptoms.  Please return with worsening wheezing, shortness of breath, or difficulty breathing.  Return with persistent fever above 101F.   Please return if you have any other emergent concerns.  Additional Information:  Your vital signs today were: BP (!) 168/74 (BP Location: Right Arm)    Pulse 64    Temp 97.8 F (36.6 C) (Oral)    Resp 16    SpO2 100%  If your blood pressure (BP) was elevated above 135/85 this visit, please have this repeated by your doctor within one month. --------------

## 2018-05-24 NOTE — ED Notes (Signed)
Pt ambulated around the pod with no complaints or difficulty. Pt started at 100% on RA and once we got back into the room Pt was still at 100%. Pt did have a little coughing spell while walking. RN notified

## 2018-05-24 NOTE — ED Triage Notes (Signed)
Pt in c/o shortness of breath with exertion for the last few days, worse today with productive cough, also nausea, dry heaving during triage

## 2018-05-24 NOTE — ED Provider Notes (Signed)
[facility] EMERGENCY DEPARTMENT Provider Note   CSN: [id number] Arrival date & time: 05/24/18  1548     History   Chief Complaint Chief Complaint  Patient presents with  . Shortness of Breath    HPI Joe Garcia is a 66 y.o. male.  Patient with history of COPD and combined heart failure presents emergency department today with worsening shortness of breath with exertion and wheezing over the past 4 days.  Patient has had increasing cough productive of white sputum that is thick in nature.  No fevers.  He denies worsening lower extremity swelling.  He denies chest pain.  No nausea, vomiting, diarrhea, or abdominal pain. The onset of this condition was acute.  Course is intermittent.  Aggravating factors: Exertion. Alleviating factors: Breathing treatment.  States that he has been compliant with his medication.  States some improvement after breathing treatment here. Patient denies risk factors for pulmonary embolism including: unilateral leg swelling, history of DVT/PE/other blood clots, use of exogenous hormones, recent immobilizations, recent surgery, recent travel (>4hr segment), malignancy, hemoptysis.       Past Medical History:  Diagnosis Date  . Alcoholism ([place])   . Arthritis    hips, L shoulder  . Asthma   . CHF (congestive heart failure) ([place])   . Depression   . Family history of anesthesia complication   . Hyperlipidemia   . Hypertension     Patient Active Problem List   Diagnosis Date Noted  . Malnutrition of moderate degree 07/16/2017  . 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 Solitary kidney, congenital 06/23/2017  . Nonischemic cardiomyopathy ([place])   . Multifocal PVCs   . Chronic combined systolic (congestive) and diastolic (congestive) heart failure ([place]) 06/11/2017  . Influenza A 07/17/2013  . Influenza 07/16/2013  . Tachycardia 07/16/2013  . Alcohol abuse 09/25/2012  . Alcohol dependence ([place]) 09/22/2012  .  Essential hypertension 03/22/2007  . DEGENERATIVE DISC DISEASE 03/22/2007  . 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 RIGHT/LEFT HEART CATH AND CORONARY ANGIOGRAPHY N/A 06/12/2017   Procedure: RIGHT/LEFT HEART CATH AND CORONARY ANGIOGRAPHY;  Surgeon: Jolaine Artist, MD;  Location: [facility];  Service: Cardiovascular;  Laterality: N/A;        Home Medications    Prior to Admission medications   Medication Sig Start Date End Date Taking? Authorizing Provider  acetaminophen (TYLENOL) 500 MG tablet Take 1,500 mg by mouth 3 (three) times daily as needed for headache (pain).     [provider]  albuterol (PROVENTIL HFA;VENTOLIN HFA) 108 (90 Base) MCG/ACT inhaler Inhale 1-2 puffs into the lungs every 4 (four) hours as needed for wheezing or shortness of breath. 02/23/18   Melynda Ripple, MD  albuterol (PROVENTIL) (2.5 MG/3ML) 0.083% nebulizer solution Take 3 mLs (2.5 mg total) by nebulization every 6 (six) hours as needed for wheezing or shortness of breath. 02/23/18   Melynda Ripple, MD  amiodarone (PACERONE) 200 MG tablet Take 1 tablet (200 mg total) by mouth daily. 07/13/17   Clegg, Amy D, NP  carvedilol (COREG) 3.125 MG tablet TAKE 1 TABLET BY MOUTH TWICE DAILY WITH MEALS 02/15/18   Bensimhon, Shaune Pascal, MD  fluticasone (FLONASE) 50 MCG/ACT nasal spray Place 2 sprays into both nostrils daily. 02/23/18   Melynda Ripple, MD  furosemide (LASIX) 20 MG tablet Take only as needed as directed by the CHF clinic. 09/03/17   Shirley Friar, PA-C  isosorbide-hydrALAZINE (BIDIL)  20-37.5 MG tablet Take 1 tablet by mouth 3 (three) times daily. 05/17/18   Bensimhon, Shaune Pascal, MD  losartan (COZAAR) 50 MG tablet TAKE 1 TABLET BY MOUTH TWICE A DAY 02/15/18   Bensimhon, Shaune Pascal, MD  pantoprazole (PROTONIX) 40 MG tablet Take 1 tablet (40 mg total) by mouth daily. 09/30/17   Bensimhon, Shaune Pascal, MD  polyethylene glycol powder  (GLYCOLAX/MIRALAX) powder Take 17 g by mouth daily as needed for mild constipation.    [provider]  Spacer/Aero-Holding Chambers (AEROCHAMBER PLUS) inhaler Use as instructed 02/23/18   Melynda Ripple, MD    Family History Family History  Problem Relation Age of Onset  . Heart disease Father   . Heart disease Daughter        "fluid around heart"     Social History Social History   Tobacco Use  . Smoking status: Former Smoker    Packs/day: 0.10    Years: 45.00    Pack years: 4.50    Last attempt to quit: 2012    Years since quitting: 7.8  . Smokeless tobacco: Never Used  Substance Use Topics  . Alcohol use: No    Frequency: Never    Comment: Pt states no etoh since April 2014  . Drug use: No    Comment: Prior use of crack cocaine, quit 2012     Allergies   Lisinopril and Penicillins   Review of Systems Review of Systems  Constitutional: Negative for diaphoresis and fever.  Eyes: Negative for redness.  Respiratory: Positive for cough, shortness of breath and wheezing.   Cardiovascular: Negative for chest pain, palpitations and leg swelling.  Gastrointestinal: Negative for abdominal pain, nausea and vomiting.  Genitourinary: Negative for dysuria.  Musculoskeletal: Negative for back pain and neck pain.  Skin: Negative for rash.  Neurological: Negative for syncope and light-headedness.  Psychiatric/Behavioral: The patient is not nervous/anxious.      Physical Exam Updated Vital Signs BP (!) 172/77 (BP Location: Right Arm)   Pulse (!) 50   Temp 97.7 F (36.5 C) (Oral)   Resp 18   SpO2 99%   Physical Exam  Constitutional: He appears well-developed and well-nourished.  HENT:  Head: Normocephalic and atraumatic.  Eyes: Conjunctivae are normal. Right eye exhibits no discharge. Left eye exhibits no discharge.  Neck: Normal range of motion. Neck supple.  Cardiovascular: Normal heart sounds. A regularly irregular rhythm present. Bradycardia present.    Bigeminy pattern noted  Pulmonary/Chest: Effort normal. He has decreased breath sounds (Generalized, bilaterally). He has wheezes (Mild, scattered).  Abdominal: Soft. There is no tenderness. There is no rebound and no guarding.  Musculoskeletal: He exhibits no edema or tenderness.  No lower extremity tenderness or swelling.  No clinical signs and symptoms of DVT.  Neurological: He is alert.  Skin: Skin is warm and dry.  Psychiatric: He has a normal mood and affect.  Nursing note and vitals reviewed.    ED Treatments / Results  Labs (all labs ordered are listed, but only abnormal results are displayed) Labs Reviewed  BASIC METABOLIC PANEL - Abnormal; Notable for the following components:      Result Value   Creatinine, Ser 1.53 (*)    GFR calc non Af Amer 46 (*)    GFR calc Af Amer 54 (*)    All other components within normal limits  CBC - Abnormal; Notable for the following components:   Hemoglobin 12.4 (*)    MCH 25.6 (*)    All other  components within normal limits  I-STAT TROPONIN, ED    ED ECG REPORT   Date: 05/24/2018  Rate: 70  Rhythm: normal sinus rhythm, PVC (bigeminy)  QRS Axis: normal  Intervals: normal  ST/T Wave abnormalities: normal  Conduction Disutrbances:none  Narrative Interpretation:   Old EKG Reviewed: changes noted bigeminy new from previous  I have personally reviewed the EKG tracing and agree with the computerized printout as noted.   Radiology Dg Chest 2 View  Result Date: 05/24/2018 CLINICAL DATA:  Chest pain x4 days EXAM: CHEST - 2 VIEW COMPARISON:  None. FINDINGS: Emphysematous hyperinflation of the lungs. Top-normal heart size. Moderate aortic atherosclerosis without aneurysm. No acute osseous abnormality. Degenerative changes are present along the dorsal spine with accentuated midthoracic kyphosis. IMPRESSION: COPD without change.  Aortic atherosclerosis. Electronically Signed   By: Ashley Royalty M.D.   On: 05/24/2018 16:55     Procedures Procedures (including critical care time)  Medications Ordered in ED Medications  albuterol (PROVENTIL HFA;VENTOLIN HFA) 108 (90 Base) MCG/ACT inhaler 2 puff (2 puffs Inhalation Given 05/24/18 2251)  ondansetron (ZOFRAN-ODT) disintegrating tablet 4 mg (4 mg Oral Given 05/24/18 1610)  ipratropium-albuterol (DUONEB) 0.5-2.5 (3) MG/3ML nebulizer solution 3 mL (3 mLs Nebulization Given 05/24/18 1621)  albuterol (PROVENTIL) (2.5 MG/3ML) 0.083% nebulizer solution 2.5 mg (2.5 mg Nebulization Given 05/24/18 1621)  predniSONE (DELTASONE) tablet 60 mg (60 mg Oral Given 05/24/18 2011)  doxycycline (VIBRA-TABS) tablet 100 mg (100 mg Oral Given 05/24/18 2012)  albuterol (PROVENTIL) (2.5 MG/3ML) 0.083% nebulizer solution 5 mg (5 mg Nebulization Given 05/24/18 2012)  ipratropium (ATROVENT) nebulizer solution 0.5 mg (0.5 mg Nebulization Given 05/24/18 2012)     Initial Impression / Assessment and Plan / ED Course  I have reviewed the triage vital signs and the nursing notes.  Pertinent labs & imaging results that were available during my care of the patient were reviewed by me and considered in my medical decision making (see chart for details).     Patient seen and examined. Work-up reviewed. Medications ordered.   Vital signs reviewed and are as follows: BP (!) 172/77 (BP Location: Right Arm)   Pulse (!) 50   Temp 97.7 F (36.5 C) (Oral)   Resp 18   SpO2 99%   Will give additional breathing treatment.  Initial impression is that patient is having a COPD exacerbation.  He does not clinically appeared fluid overloaded.  I have low suspicion for ACS or PE at this point.  Patient will be given steroids and started on antibiotics as well given increasing sputum production.  11:18 PM patient improved clinically.  Wheezing nearly resolved.  Patient has ambulated in the department and maintain oxygen saturation.  Repeat EKG unchanged.  Upon reentering the room, patient is dressed  ready to go.  He states that he needs to leave to catch the bus at 11:30 PM.  Patient has been given an albuterol inhaler.  Home with additional 4-day burst of prednisone and doxycycline.  Encourage patient follow-up with his primary care doctor in the next 2 to 3 days for recheck.  Encouraged return to the emergency department with worsening shortness of breath, fever, trouble breathing, chest pain, new concerns.  Final Clinical Impressions(s) / ED Diagnoses   Final diagnoses:  COPD exacerbation ([place])   Patient with increased wheezing and shortness of breath with exertion.  No clinical signs of DVT and I have low suspicion of PE today.  Cardiac work-up is negative.  Patient clinically improved with treatment here  in emergency department tonight.  At this point he has ambulated without hypoxia.  He appears improved.  He is ready for discharge.  Return instructions as above.  ED Discharge Orders    None       Carlisle Cater, Hershal Coria 05/24/18 2320    Daleen Bo, MD 05/26/18 1014

## 2018-06-01 ENCOUNTER — Inpatient Hospital Stay (HOSPITAL_COMMUNITY): Admission: RE | Admit: 2018-06-01 | Payer: Self-pay | Source: Ambulatory Visit

## 2018-06-02 ENCOUNTER — Encounter (HOSPITAL_COMMUNITY): Payer: Self-pay

## 2018-06-02 ENCOUNTER — Ambulatory Visit (HOSPITAL_COMMUNITY)
Admission: RE | Admit: 2018-06-02 | Discharge: 2018-06-02 | Disposition: A | Discharge status: Home / Self Care | Payer: Medicaid Other | Source: Ambulatory Visit | Attending: Internal Medicine | Admitting: Internal Medicine

## 2018-06-02 VITALS — BP 160/82 | HR 46 | Wt 161.4 lb

## 2018-06-02 DIAGNOSIS — I5020 Unspecified systolic (congestive) heart failure: Secondary | ICD-10-CM | POA: Insufficient documentation

## 2018-06-02 DIAGNOSIS — Q600 Renal agenesis, unilateral: Secondary | ICD-10-CM | POA: Diagnosis not present

## 2018-06-02 DIAGNOSIS — F1411 Cocaine abuse, in remission: Secondary | ICD-10-CM | POA: Insufficient documentation

## 2018-06-02 DIAGNOSIS — N529 Male erectile dysfunction, unspecified: Secondary | ICD-10-CM | POA: Diagnosis not present

## 2018-06-02 DIAGNOSIS — I110 Hypertensive heart disease with heart failure: Secondary | ICD-10-CM | POA: Diagnosis not present

## 2018-06-02 DIAGNOSIS — I428 Other cardiomyopathies: Secondary | ICD-10-CM | POA: Diagnosis not present

## 2018-06-02 DIAGNOSIS — Z550 Illiteracy and low-level literacy: Secondary | ICD-10-CM | POA: Insufficient documentation

## 2018-06-02 DIAGNOSIS — I5042 Chronic combined systolic (congestive) and diastolic (congestive) heart failure: Secondary | ICD-10-CM

## 2018-06-02 DIAGNOSIS — E785 Hyperlipidemia, unspecified: Secondary | ICD-10-CM | POA: Diagnosis not present

## 2018-06-02 DIAGNOSIS — J45909 Unspecified asthma, uncomplicated: Secondary | ICD-10-CM | POA: Diagnosis not present

## 2018-06-02 DIAGNOSIS — I493 Ventricular premature depolarization: Secondary | ICD-10-CM | POA: Insufficient documentation

## 2018-06-02 DIAGNOSIS — K219 Gastro-esophageal reflux disease without esophagitis: Secondary | ICD-10-CM | POA: Insufficient documentation

## 2018-06-02 DIAGNOSIS — Z79899 Other long term (current) drug therapy: Secondary | ICD-10-CM | POA: Diagnosis not present

## 2018-06-02 LAB — BASIC METABOLIC PANEL
Anion gap: 6 (ref 5–15)
BUN: 14 mg/dL (ref 8–23)
CO2: 23 mmol/L (ref 22–32)
Calcium: 8.5 mg/dL — ABNORMAL LOW (ref 8.9–10.3)
Chloride: 109 mmol/L (ref 98–111)
Creatinine, Ser: 1.34 mg/dL — ABNORMAL HIGH (ref 0.61–1.24)
GFR calc Af Amer: >60 mL/min (ref >60)
GFR, EST NON AFRICAN AMERICAN: 54 mL/min — AB (ref >60)
GLUCOSE: 127 mg/dL — AB (ref 70–99)
POTASSIUM: 3.1 mmol/L — AB (ref 3.5–5.1)
Sodium: 138 mmol/L (ref 135–145)

## 2018-06-02 MED ORDER — ISOSORB DINITRATE-HYDRALAZINE 20-37.5 MG PO TABS: 0.5000 | ORAL_TABLET | Freq: Three times a day (TID) | ORAL | 5 refills | Status: DC

## 2018-06-02 NOTE — Patient Instructions (Addendum)
It was nice to see you today!  TAKE one dose of your furosemide 20 mg (1 tablet) today. Continue to take this as needed for weight gain and shortness of breath.  TAKE Bidil one-half tablet three times per day.  Blood work today. We will call you with any changes.  Please see the pharmacist on Wednesday, 11/20 at 1:00 PM.

## 2018-06-02 NOTE — Progress Notes (Signed)
HF MD: [name]  HPI:  Joe Garcia a 67 y.o.malewith PMH of systolic CHF due to NICM, HTN, HLD, Asthma, and solitary kidney. Former crack cocaine abuse.   Admitted 11/14 - 06/15/17 with CP and DOE. Received steroids and nebs. BNP minimally elevated on arrival. Troponin negative. R/LHC below with normal cors and compensated hemodynamics. 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   Re-admitted 11/21-11/23/18 with cramping abdominal pain and GI symptoms. CT abdomen with no negative findings, treated as gastroenteritis. AKI noted with rise of creatinine from 1.0 ->2.0 on admit. Improved to 1.7 at discharge. Pt states he never picked up [facility] after he left the hospital initially.   Seen in ED 09/09/17 with N/V and "chest pain". CP felt like his normal reflux. EKG with PVCs and NSR. Had been seen at home by paramedicine and told HR was in 30s, but normal in 60s on monitor. Likely undercounting due to PVCs. Work up unremarkable.   Pt reports today for pharmacist-led HF medication titration. At last HF clinic visit on 10/21, his Bidil was restarted. Today, he reports that although the Bidil helped his blood pressure, but he stopped taking the medication after one day due to lightheadedness and visual disturbances. He reports that he has a head cold today and feels short of breath today. He was recently hospitalized due to increased wheezing and SOB and was discharged 10/28 with doxycycline and prednisone, of which he completed the course. However, he feels the medication did not do anything to help him. He has used his lasix recently to help with his SOB and reports that his fluid never goes into his legs, but rather his lungs and sometimes his stomach. He endorses that his HR fluctuates frequently in the 20s-40s but he does not endorse dizziness.   Shortness of breath/dyspnea on exertion? Yes - starting to feel SOB today  Orthopnea/PND? Yes - Uses two pillows at  night  Edema? no  Lightheadedness/dizziness? no  Daily weights at home? no  Blood pressure/heart rate monitoring at home? Yes - Elevated since stopping Bidil  Following low-sodium/fluid-restricted diet? No - Trying to follow recommended diet ([place] sandwich, cake, and soda for lunch today; roast beef, sandwich, and soda yesterday)  HF Medications: Carvedilol 3.125 mg PO BID Furosemide 20 mg PO PRN Losartan 50 mg PO BID  Has the patient been experiencing any side effects to the medications prescribed?  yes - Endorses lightheadedness and visual disturbances on Bidil (20-37.5 mg TID)  Does the patient have any problems obtaining medications due to transportation or finances?   No -  Medicaid  Understanding of regimen: good Understanding of indications: fair Potential of compliance: fair Patient understands to avoid NSAIDs. Patient understands to avoid decongestants.   Pertinent Lab Values:  06/02/18: Serum creatinine 1.34, BUN 14, Potassium 3.1, Sodium 138  05/24/18: Serum creatinine 1.53, BUN 17, Potassium 3.8, Sodium 140  Vital Signs:  Weight: 161.4 lb (last clinic weight: 157 lb)  Blood pressure: 160/82 mmHg   Heart rate: 46 bpm    ReDS Vest: 36%  Assessment: 1. Chronicsystolic CHF (EF 31-54%), due to NICM. NYHA class IIIsymptoms. - Volume status elevated with increased weight and slightly elevated ReDS Vest - Will take one dose of furosemide 20mg  today and continue PRN for SOB - Restart Bidil one-half tablet TID - Continue carvedilol 3.125 mg BID and losartan 50 mg BID - Refused Entresto in the past due to solitary kidney - Basic disease state pathophysiology, medication indication, mechanism and side effects  reviewed at length with patient and he verbalized understanding  2. PVCs - Stable. Continue amio to 200 mg daily.No change.  3. HTN - Uncontrolled.  - Goal SBP <130. -Meds as above.  4. Solitary Kidney, Congenital - Follow renal  function carefully with med adjustments.  -BMET today.  5. Illiteracy - He can not read pill bottles.Graduated paramedicine.  - Has gotten better at taking his pills.  6. Asthma -per PCP. - No wheeze on exam.  7. GERD -Continueprotonix 40 mg daily  8. ED - Cannot do viagra with bidil -Per urology.   Plan: 1) Medication changes: Based on clinical presentation, vital signs and recent labs will take one dose of furosemide today and restart Bidil (20-37.5 mg) one-half tablet PO TID. 2) Labs: BMET today and at next visit 3) Follow-up: Pharmacist visit 11/20 and PA/NP on 08/17/2018   Calea Hribar K. Velva Harman, PharmD, BCPS, CPP Clinical Pharmacist Phone: [phone] 05/31/2018 11:22 AM

## 2018-06-09 ENCOUNTER — Telehealth (HOSPITAL_COMMUNITY): Payer: Self-pay | Admitting: Pharmacist

## 2018-06-09 MED ORDER — POTASSIUM CHLORIDE ER 10 MEQ PO TBCR: 10.0000 meq | EXTENDED_RELEASE_TABLET | Freq: Every day | ORAL | 5 refills | Status: DC

## 2018-06-09 NOTE — Telephone Encounter (Signed)
Serum K level was low at last visit. Spoke with patient's daughter and will have him start taking KCl 10 meq daily.   Ruta Hinds. Velva Harman, PharmD, BCPS, CPP Clinical Pharmacist Phone: [phone] 06/09/2018 11:47 AM

## 2018-06-11 ENCOUNTER — Telehealth (HOSPITAL_COMMUNITY): Payer: Self-pay | Admitting: Cardiology

## 2018-06-11 DIAGNOSIS — I5042 Chronic combined systolic (congestive) and diastolic (congestive) heart failure: Secondary | ICD-10-CM

## 2018-06-11 MED ORDER — POTASSIUM CHLORIDE ER 10 MEQ PO TBCR: 30.0000 meq | EXTENDED_RELEASE_TABLET | Freq: Every day | ORAL | 5 refills | Status: DC

## 2018-06-11 NOTE — Telephone Encounter (Signed)
Notes recorded by Kerry Dory, CMA on 06/11/2018 at 9:54 AM EST Patient aware via daughter, will recheck labs at pharmacy follow up  ------  Notes recorded by Bensimhon, Shaune Pascal, MD on 06/10/2018 at 7:27 PM EST Lets have him take 60 kcl and increase to 30 daily. Recheck 1 week.

## 2018-06-11 NOTE — Telephone Encounter (Signed)
-----   Message from Jolaine Artist, MD sent at 06/10/2018  7:27 PM EST ----- Lets have him take 60 kcl and increase to 30 daily. Recheck 1 week.

## 2018-06-16 ENCOUNTER — Ambulatory Visit (HOSPITAL_COMMUNITY)
Admission: RE | Admit: 2018-06-16 | Discharge: 2018-06-16 | Disposition: A | Discharge status: Home / Self Care | Payer: Medicaid Other | Source: Ambulatory Visit | Attending: Cardiology | Admitting: Cardiology

## 2018-06-16 VITALS — BP 156/98 | HR 58 | Wt 156.0 lb

## 2018-06-16 DIAGNOSIS — I428 Other cardiomyopathies: Secondary | ICD-10-CM | POA: Diagnosis not present

## 2018-06-16 DIAGNOSIS — J449 Chronic obstructive pulmonary disease, unspecified: Secondary | ICD-10-CM | POA: Insufficient documentation

## 2018-06-16 DIAGNOSIS — I110 Hypertensive heart disease with heart failure: Secondary | ICD-10-CM | POA: Diagnosis not present

## 2018-06-16 DIAGNOSIS — Q600 Renal agenesis, unilateral: Secondary | ICD-10-CM | POA: Diagnosis not present

## 2018-06-16 DIAGNOSIS — N529 Male erectile dysfunction, unspecified: Secondary | ICD-10-CM | POA: Insufficient documentation

## 2018-06-16 DIAGNOSIS — I493 Ventricular premature depolarization: Secondary | ICD-10-CM | POA: Diagnosis not present

## 2018-06-16 DIAGNOSIS — Z79899 Other long term (current) drug therapy: Secondary | ICD-10-CM | POA: Insufficient documentation

## 2018-06-16 DIAGNOSIS — K219 Gastro-esophageal reflux disease without esophagitis: Secondary | ICD-10-CM | POA: Insufficient documentation

## 2018-06-16 DIAGNOSIS — I5022 Chronic systolic (congestive) heart failure: Secondary | ICD-10-CM | POA: Insufficient documentation

## 2018-06-16 DIAGNOSIS — Z550 Illiteracy and low-level literacy: Secondary | ICD-10-CM | POA: Diagnosis not present

## 2018-06-16 DIAGNOSIS — E785 Hyperlipidemia, unspecified: Secondary | ICD-10-CM | POA: Insufficient documentation

## 2018-06-16 DIAGNOSIS — R5383 Other fatigue: Secondary | ICD-10-CM | POA: Diagnosis not present

## 2018-06-16 LAB — FOLATE: FOLATE: 28.3 ng/mL (ref >5.9)

## 2018-06-16 LAB — BASIC METABOLIC PANEL
ANION GAP: 8 (ref 5–15)
BUN: 18 mg/dL (ref 8–23)
CALCIUM: 9.3 mg/dL (ref 8.9–10.3)
CO2: 20 mmol/L — AB (ref 22–32)
Chloride: 109 mmol/L (ref 98–111)
Creatinine, Ser: 2.05 mg/dL — ABNORMAL HIGH (ref 0.61–1.24)
GFR calc Af Amer: 37 mL/min — ABNORMAL LOW (ref >60)
GFR, EST NON AFRICAN AMERICAN: 32 mL/min — AB (ref >60)
Glucose, Bld: 100 mg/dL — ABNORMAL HIGH (ref 70–99)
Potassium: 5 mmol/L (ref 3.5–5.1)
SODIUM: 137 mmol/L (ref 135–145)

## 2018-06-16 LAB — VITAMIN B12: VITAMIN B 12: 320 pg/mL (ref 180–914)

## 2018-06-16 LAB — FERRITIN: Ferritin: 26 ng/mL (ref 24–336)

## 2018-06-16 LAB — IRON AND TIBC
IRON: 82 ug/dL (ref 45–182)
Saturation Ratios: 22 % (ref 17.9–39.5)
TIBC: 374 ug/dL (ref 250–450)
UIBC: 292 ug/dL

## 2018-06-16 MED ORDER — SPIRONOLACTONE 25 MG PO TABS: 12.5000 mg | ORAL_TABLET | Freq: Every day | ORAL | 5 refills | Status: DC

## 2018-06-16 NOTE — Patient Instructions (Addendum)
Please CONTINUE furosemide 20 mg (1 tablet) ONCE DAILY.   Please START spironolactone 1/2 tablet (12.5 mg) ONCE DAILY.   Blood work today. We will call you with any changes.   You have been referred to the lung doctor (Dr. Lake Bells) who will call you to schedule an appointment.   You are scheduled to see the pharmacist, Doroteo Bradford again on 11/26 at 3:00 PM.

## 2018-06-16 NOTE — Progress Notes (Signed)
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 Former crack cocaine abuse.   Admitted 11/14 - 06/15/17 with CP and DOE. Received steroids and nebs. BNP minimally elevated on arrival. Troponin negative. R/LHC below with normal cors and compensated hemodynamics. 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   Re-admitted 11/21-11/23/18 with cramping abdominal pain and GI symptoms. CT abdomen with no negative findings, treated as gastroenteritis. AKI noted with rise of creatinine from 1.0 ->2.0 on admit. Improved to 1.7 at discharge. Pt states he never picked up [facility] after he left the hospital initially.   Seen in ED 09/09/17 with N/V and "chest pain". CP felt like his normal reflux. EKG with PVCs and NSR. Had been seen at home by paramedicine and told HR was in 30s, but normal in 60s on monitor. Likely undercounting due to PVCs. Work up unremarkable.   Pt reports today for pharmacist-led HF medication titration. At last HF clinic visit on 11/6, his Bidil was restarted at 1/2 tablet TID. Today he reports feeling about the same although he has been able to tolerate Bidil at 1 tab QAM then 1/2 tab at noon and 1/2 tab QPM. When he takes 1 tablet twice daily and 1/2 tablet in the evening, he reports "virtigo" like symptoms. He reports that he still feels short of breath today and it has not improved or worsened since our last visit. His BP has improved at home to 145/76 mmHg. He has used his lasix daily recently to help with his SOB which has only helped a little. He endorses that his HR fluctuates frequently in the 40-50s but he does not endorse dizziness.   Shortness of breath/dyspnea on exertion? Yes - stable SOB   Orthopnea/PND? Yes - stable - uses two pillows at night  Edema? no  Lightheadedness/dizziness? no  Daily weights at home? no  Blood pressure/heart rate monitoring at home? Yes -  starting to come down -- 145/76 mmHg, HR 45  Following low-sodium/fluid-restricted diet? No - eating more fried chicken, hamburgers but trying to eat more boiled food  HF Medications: Bidil 1 tab QAM and 1/2 tablet Qnoon and 1/2 tablet QPM Carvedilol 3.125 mg PO BID Furosemide 20 mg PO daily Losartan 50 mg PO BID  Has the patient been experiencing any side effects to the medications prescribed? No  Does the patient have any problems obtaining medications due to transportation or finances?No- [place] Medicaid  Understanding of regimen:good Understanding of indications: fair Potential of compliance:fair Patient understands to avoid NSAIDs. Patient understands to avoid decongestants.   Pertinent Lab Values:  06/16/18: Serum creatinine2.05 (BL 1.3-1.5), BUN18, Potassium5.0, [id number]  Vital Signs:  Weight: 156 lb (last clinicweight:157 lb)  Blood pressure: 156/98 mmHg   Heart rate: 58 bpm               ReDS Vest: 36%>>35%  Assessment: 1. Chronicsystolic CHF (WE31-54%), due toNICM. NYHA classIIIsymptoms. - Volume status stable on furosemide 20 mg daily - Start spironolactone 12.5 mg daily with close monitoring of kidney function with solitary kidney - Continue furosemide 20 mg daily, Bidil 1 tab QAM/0.5 tab Qnoon/0.5 tab QPM, carvedilol 3.125 mg BID, KCl 30 meq daily and losartan 50 mg BID - Refused Entresto in the past due to solitary kidney - Basic disease state pathophysiology, medication indication, mechanism and side effects reviewed at length with patient and he verbalized understanding  2. PVCs - Stable. Continue amio  to 200 mg daily.No change.  3. HTN - Uncontrolled.  - Goal SBP <130 -Start spiro as above  4. Solitary Kidney, Congenital - Follow renal function carefully with med adjustments.  -BMET today  5. Illiteracy - He can not read pill bottles.Graduated paramedicine.  - Has gotten better at taking his pills.  6.  Asthma/COPD -Feels like he is not being managed appropriately by PCP - Will refer to pulmonology (Dr. Lake Bells for further management)  7. GERD -Continueprotonix 40 mg daily  8. ED - Cannot do viagra with bidil -Per urology  9. Fatigue - Will get iron panel today to assess for iron deficiency   Plan: 1) Medication changes: Based on clinical presentation, vital signs and recent labs will start spironolactone 12.5 mg daily 2) Labs: BMET and iron panel today 3) Follow-up:Pharmacy visit on 11/26 and PA/NP on 08/17/2018  ADDENDUM: - SCr and K levels elevated today - Called and spoke with patient's daughter and advised to have patient stop KCl, only take furosemide PRN weight gain and to hold off on taking spironolactone for now - Will get BMET again next week at visit with me    Doroteo Bradford K. Velva Harman, PharmD, BCPS, CPP Clinical Pharmacist Phone: [phone] 06/16/2018 10:50 AM

## 2018-06-21 ENCOUNTER — Other Ambulatory Visit (HOSPITAL_COMMUNITY): Payer: Self-pay | Admitting: Internal Medicine

## 2018-06-22 ENCOUNTER — Ambulatory Visit (HOSPITAL_COMMUNITY)
Admission: RE | Admit: 2018-06-22 | Discharge: 2018-06-22 | Disposition: A | Discharge status: Home / Self Care | Payer: Medicaid Other | Source: Ambulatory Visit | Attending: Cardiology | Admitting: Cardiology

## 2018-06-22 VITALS — BP 172/74 | HR 54 | Wt 156.4 lb

## 2018-06-22 DIAGNOSIS — J45909 Unspecified asthma, uncomplicated: Secondary | ICD-10-CM | POA: Insufficient documentation

## 2018-06-22 DIAGNOSIS — R5383 Other fatigue: Secondary | ICD-10-CM | POA: Diagnosis not present

## 2018-06-22 DIAGNOSIS — I428 Other cardiomyopathies: Secondary | ICD-10-CM | POA: Insufficient documentation

## 2018-06-22 DIAGNOSIS — I5032 Chronic diastolic (congestive) heart failure: Secondary | ICD-10-CM | POA: Diagnosis present

## 2018-06-22 DIAGNOSIS — K219 Gastro-esophageal reflux disease without esophagitis: Secondary | ICD-10-CM | POA: Diagnosis not present

## 2018-06-22 DIAGNOSIS — E785 Hyperlipidemia, unspecified: Secondary | ICD-10-CM | POA: Diagnosis not present

## 2018-06-22 DIAGNOSIS — Q600 Renal agenesis, unilateral: Secondary | ICD-10-CM | POA: Insufficient documentation

## 2018-06-22 DIAGNOSIS — I110 Hypertensive heart disease with heart failure: Secondary | ICD-10-CM | POA: Diagnosis present

## 2018-06-22 DIAGNOSIS — I493 Ventricular premature depolarization: Secondary | ICD-10-CM | POA: Diagnosis not present

## 2018-06-22 DIAGNOSIS — N529 Male erectile dysfunction, unspecified: Secondary | ICD-10-CM | POA: Insufficient documentation

## 2018-06-22 DIAGNOSIS — I5042 Chronic combined systolic (congestive) and diastolic (congestive) heart failure: Secondary | ICD-10-CM

## 2018-06-22 DIAGNOSIS — Z550 Illiteracy and low-level literacy: Secondary | ICD-10-CM | POA: Diagnosis not present

## 2018-06-22 LAB — BASIC METABOLIC PANEL
Anion gap: 5 (ref 5–15)
BUN: 24 mg/dL — ABNORMAL HIGH (ref 8–23)
CO2: 21 mmol/L — AB (ref 22–32)
Calcium: 8.8 mg/dL — ABNORMAL LOW (ref 8.9–10.3)
Chloride: 113 mmol/L — ABNORMAL HIGH (ref 98–111)
Creatinine, Ser: 1.82 mg/dL — ABNORMAL HIGH (ref 0.61–1.24)
GFR calc Af Amer: 44 mL/min — ABNORMAL LOW (ref >60)
GFR calc non Af Amer: 38 mL/min — ABNORMAL LOW (ref >60)
GLUCOSE: 128 mg/dL — AB (ref 70–99)
POTASSIUM: 4.3 mmol/L (ref 3.5–5.1)
Sodium: 139 mmol/L (ref 135–145)

## 2018-06-22 MED ORDER — AMLODIPINE BESYLATE 5 MG PO TABS: 5.0000 mg | ORAL_TABLET | Freq: Every day | ORAL | 3 refills | Status: DC

## 2018-06-22 NOTE — Progress Notes (Signed)
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 Former crack cocaine abuse.   Admitted 11/14 - 06/15/17 with CP and DOE. Received steroids and nebs. BNP minimally elevated on arrival. Troponin negative. R/LHC below with normal cors and compensated hemodynamics. 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   Re-admitted 11/21-11/23/18 with cramping abdominal pain and GI symptoms. CT abdomen with no negative findings, treated as gastroenteritis. AKI noted with rise of creatinine from 1.0 ->2.0 on admit. Improved to 1.7 at discharge. Pt states he never picked up [facility] after he left the hospital initially.   Seen in ED 09/09/17 with N/V and "chest pain". CP felt like his normal reflux. EKG with PVCs and NSR. Had been seen at home by paramedicine and told HR was in 30s, but normal in 60s on monitor. Likely undercounting due to PVCs. Work up unremarkable.   Pt reports today for pharmacist-led HF medication titration. At last pharmacist-led HF visit on 11/20, he was originally supposed to start taking spironolactone 12.5 mg daily. However, his Scr and K (5.0) levels were elevated, so he was instructed to stop taking KCl, to only taking furosemide PRN for weight gain, and to hold his spironolactone for now. Today, he reports he decreased his BiDil to 1/2 tablet TID due to a headache in the back of his head. He thinks it is attributed to his BiDil or possibly because he may be coming down with a cold. He reports that Tylenol improves his symptoms but takes a while to start working. He has not used Tylenol recently. He has not taken any Lasix since his last visit and does not weigh himself at home. BP elevated in clinic today. He reports his SBP at home is usually in the 140-170s, but his BP this morning was 105/35 mmHg with a HR of 55 bpm. He took all of his medications this morning and reports no missed doses.  SOB and orthopnea stable. Denies edema and lightheadedness.   Shortness of breath/dyspnea on exertion? yes - stable  Orthopnea/PND? yes - stable; 2 pillows  Edema? no  Lightheadedness/dizziness? no  Daily weights at home? yes  Blood pressure/heart rate monitoring at home? no  Following low-sodium/fluid-restricted diet? yes  HF Medications: Bidil 1/2 tab PO TID Carvedilol 3.125 mg PO BID Furosemide 20 mg PO PRN for weight gain Losartan 50 mg PO BID  Has the patient been experiencing any side effects to the medications prescribed?  Yes - Headache worsened this past week  Does the patient have any problems obtaining medications due to transportation or finances?   No - [place] Medicaid  Understanding of regimen: good Understanding of indications: good Potential of compliance: fair Patient understands to avoid NSAIDs. Patient understands to avoid decongestants.   Pertinent Lab Values:  06/22/18: Serum creatinine 1.82 (BL 1.3-1.5), BUN 24, Potassium 4.3, Sodium 139  06/16/18: Serum creatinine 2.05, BUN 18, Potassium 5.0, Sodium 137  Vital Signs:  Weight: 156.4 lb (last clinic weight: 156 lb)  Blood pressure: 182/86 >> 172/74 mmHg   Heart rate: 54 bpm   ReDS Vest: 36%>35%>35%  Assessment: 1. Improved diastolic CHF (ZO10-96>04-54%), due toNICM. NYHA classIIIsymptoms. - Volume status stable as seen with weight and ReDS Vest reading. - Continue furosemide 20 mg PRN for weight gain, carvedilol 3.125 mg BID, BiDil 1/2 tab TID, and losartan 50 mg BID  - If BP remains elevated at next visit and SCr/K stable, can  consider addition of spironolactone - Refused Entresto in the past due to solitary kidney - Basic disease state pathophysiology, medication indication, mechanism and side effects reviewed at length with patient and he verbalized understanding  2. PVCs - Stable. Continue amio to 200 mg daily.No change.  3. HTN - Uncontrolled.  - BP elevated above  goal BP <130/80 -Start amlodipine 5 mg PO daily  - If BP remains elevated at next visit and SCr/K stable, can consider addition of spironolactone  4. Solitary Kidney, Congenital - Renal function improving. Follow carefully with med adjustments.  -BMET today  5. Illiteracy - He can not read pill bottles.Graduated paramedicine.  - Has gotten better at taking his pills.  6. Asthma/COPD -Feels like he is not being managed appropriately by PCP - Will refer to pulmonology (Dr. Lake Bells for further management)  7. GERD -Continueprotonix 40 mg daily  8. ED - Cannot do viagra with bidil -Per urology  9. Fatigue - Iron panel WNL at last visit  Plan: 1) Medication changes: Based on clinical presentation, vital signs and recent labs will start amlodipine 5 mg daily. 2) Labs: BMET today and at next visit 3) Follow-up: Pharmacy visit on 07/06/18 and PA/NP visit on 08/17/18   Laneta Guerin K. Velva Harman, PharmD, BCPS, CPP Clinical Pharmacist Phone: [phone] 06/20/2018 10:36 AM

## 2018-06-22 NOTE — Patient Instructions (Addendum)
It was nice to see you today!  START taking amlodipine 1 tablet (5 mg) ONCE daily.  Blood work today - your kidney function is improving and your potassium is normal.  Please weigh yourself EVERY DAY.  Your next appointment with the pharmacist is on 12/10 at 3:00 pm

## 2018-06-25 ENCOUNTER — Emergency Department (HOSPITAL_COMMUNITY)
Admission: EM | Admit: 2018-06-25 | Discharge: 2018-06-25 | Disposition: A | Discharge status: Home / Self Care | Payer: Medicaid Other | Attending: Emergency Medicine | Admitting: Emergency Medicine

## 2018-06-25 ENCOUNTER — Other Ambulatory Visit: Payer: Self-pay

## 2018-06-25 ENCOUNTER — Emergency Department (HOSPITAL_COMMUNITY): Payer: Medicaid Other

## 2018-06-25 ENCOUNTER — Encounter (HOSPITAL_COMMUNITY): Payer: Self-pay | Admitting: Emergency Medicine

## 2018-06-25 DIAGNOSIS — Z87891 Personal history of nicotine dependence: Secondary | ICD-10-CM | POA: Insufficient documentation

## 2018-06-25 DIAGNOSIS — R062 Wheezing: Secondary | ICD-10-CM | POA: Insufficient documentation

## 2018-06-25 DIAGNOSIS — I130 Hypertensive heart and chronic kidney disease with heart failure and stage 1 through stage 4 chronic kidney disease, or unspecified chronic kidney disease: Secondary | ICD-10-CM | POA: Diagnosis not present

## 2018-06-25 DIAGNOSIS — R0602 Shortness of breath: Secondary | ICD-10-CM | POA: Diagnosis present

## 2018-06-25 DIAGNOSIS — J441 Chronic obstructive pulmonary disease with (acute) exacerbation: Secondary | ICD-10-CM | POA: Insufficient documentation

## 2018-06-25 DIAGNOSIS — I509 Heart failure, unspecified: Secondary | ICD-10-CM | POA: Insufficient documentation

## 2018-06-25 DIAGNOSIS — Z79899 Other long term (current) drug therapy: Secondary | ICD-10-CM | POA: Insufficient documentation

## 2018-06-25 DIAGNOSIS — N182 Chronic kidney disease, stage 2 (mild): Secondary | ICD-10-CM | POA: Diagnosis not present

## 2018-06-25 LAB — CBC WITH DIFFERENTIAL/PLATELET
Abs Immature Granulocytes: 0.02 10*3/uL (ref 0.00–0.07)
Basophils Absolute: 0 10*3/uL (ref 0.0–0.1)
Basophils Relative: 0 %
Eosinophils Absolute: 0.6 10*3/uL — ABNORMAL HIGH (ref 0.0–0.5)
Eosinophils Relative: 12 %
HCT: 40.2 % (ref 39.0–52.0)
Hemoglobin: 12.6 g/dL — ABNORMAL LOW (ref 13.0–17.0)
Immature Granulocytes: 0 %
Lymphocytes Relative: 53 %
Lymphs Abs: 2.8 10*3/uL (ref 0.7–4.0)
MCH: 26.9 pg (ref 26.0–34.0)
MCHC: 31.3 g/dL (ref 30.0–36.0)
MCV: 85.7 fL (ref 80.0–100.0)
Monocytes Absolute: 0.4 10*3/uL (ref 0.1–1.0)
Monocytes Relative: 8 %
Neutro Abs: 1.4 10*3/uL — ABNORMAL LOW (ref 1.7–7.7)
Neutrophils Relative %: 27 %
Platelets: 292 10*3/uL (ref 150–400)
RBC: 4.69 MIL/uL (ref 4.22–5.81)
RDW: 15.4 % (ref 11.5–15.5)
WBC: 5.3 10*3/uL (ref 4.0–10.5)
nRBC: 0 % (ref 0.0–0.2)

## 2018-06-25 LAB — I-STAT TROPONIN, ED: TROPONIN I, POC: 0 ng/mL (ref 0.00–0.08)

## 2018-06-25 LAB — BASIC METABOLIC PANEL
Anion gap: 9 (ref 5–15)
BUN: 17 mg/dL (ref 8–23)
CO2: 21 mmol/L — ABNORMAL LOW (ref 22–32)
CREATININE: 1.59 mg/dL — AB (ref 0.61–1.24)
Calcium: 9.2 mg/dL (ref 8.9–10.3)
Chloride: 110 mmol/L (ref 98–111)
GFR calc Af Amer: 52 mL/min — ABNORMAL LOW (ref >60)
GFR calc non Af Amer: 45 mL/min — ABNORMAL LOW (ref >60)
Glucose, Bld: 79 mg/dL (ref 70–99)
Potassium: 4.1 mmol/L (ref 3.5–5.1)
Sodium: 140 mmol/L (ref 135–145)

## 2018-06-25 LAB — BRAIN NATRIURETIC PEPTIDE: B Natriuretic Peptide: 194.6 pg/mL — ABNORMAL HIGH (ref 0.0–100.0)

## 2018-06-25 MED ORDER — METHYLPREDNISOLONE SODIUM SUCC 125 MG IJ SOLR
125.0000 mg | Freq: Once | INTRAMUSCULAR | Status: AC
  Administered 2018-06-25: 125 mg via INTRAVENOUS
  Filled 2018-06-25: qty 2

## 2018-06-25 MED ORDER — ALBUTEROL (5 MG/ML) CONTINUOUS INHALATION SOLN
10.0000 mg/h | INHALATION_SOLUTION | RESPIRATORY_TRACT | Status: AC
  Administered 2018-06-25: 10 mg/h via RESPIRATORY_TRACT
  Filled 2018-06-25: qty 20

## 2018-06-25 MED ORDER — ONDANSETRON HCL 4 MG/2ML IJ SOLN
4.0000 mg | Freq: Once | INTRAMUSCULAR | Status: AC
  Administered 2018-06-25: 4 mg via INTRAVENOUS
  Filled 2018-06-25: qty 2

## 2018-06-25 MED ORDER — IPRATROPIUM-ALBUTEROL 0.5-2.5 (3) MG/3ML IN SOLN
RESPIRATORY_TRACT | Status: AC
  Administered 2018-06-25: 3 mL
  Filled 2018-06-25: qty 3

## 2018-06-25 MED ORDER — IPRATROPIUM-ALBUTEROL 0.5-2.5 (3) MG/3ML IN SOLN
5.0000 mL | Freq: Once | RESPIRATORY_TRACT | Status: AC
  Administered 2018-06-25: 5 mL via RESPIRATORY_TRACT
  Filled 2018-06-25: qty 6

## 2018-06-25 MED ORDER — ALBUTEROL SULFATE HFA 108 (90 BASE) MCG/ACT IN AERS
2.0000 | INHALATION_SPRAY | Freq: Once | RESPIRATORY_TRACT | Status: AC
  Administered 2018-06-25: 2 via RESPIRATORY_TRACT
  Filled 2018-06-25: qty 6.7

## 2018-06-25 MED ORDER — PREDNISONE 20 MG PO TABS: 40.0000 mg | ORAL_TABLET | Freq: Every day | ORAL | 0 refills | Status: DC

## 2018-06-25 NOTE — ED Notes (Addendum)
Ambulated patient in the hall while checking pulse oximetry. Patient's oxygen saturation stayed between 95-99% on room air. Patient was able to hold a conversation while walking.

## 2018-06-25 NOTE — ED Triage Notes (Signed)
Sudden onset SOB this evening, waking pt from sleep. Pt unable to speak more than 1-2 words between breaths. Wheezing throughout.

## 2018-06-25 NOTE — ED Provider Notes (Signed)
[place] EMERGENCY DEPARTMENT Provider Note   CSN: [id number] Arrival date & time: 06/25/18  0300     History   Chief Complaint Chief Complaint  Patient presents with  . Shortness of Breath    HPI Joe Garcia is a 67 y.o. male.  Patient presents to the emergency department with a chief complaint of shortness of breath.  He has a history of asthma.  States that he has been using his inhaler tonight with no improvement of his symptoms.  Reports associated wheezing.  Denies any fevers, chills, or productive cough.  Denies any other associated symptoms.  Patient states that this feels like asthma exacerbation.  The history is provided by the patient. No language interpreter was used.    Past Medical History:  Diagnosis Date  . Alcoholism ([place])   . Arthritis    hips, L shoulder  . Asthma   . CHF (congestive heart failure) ([place])   . Depression   . Family history of anesthesia complication   . Hyperlipidemia   . Hypertension     Patient Active Problem List   Diagnosis Date Noted  . Malnutrition of moderate degree 07/16/2017  . 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 Solitary kidney, congenital 06/23/2017  . Nonischemic cardiomyopathy ([place])   . Multifocal PVCs   . Chronic combined systolic (congestive) and diastolic (congestive) heart failure ([place]) 06/11/2017  . Influenza A 07/17/2013  . Influenza 07/16/2013  . Tachycardia 07/16/2013  . Alcohol abuse 09/25/2012  . Alcohol dependence ([place]) 09/22/2012  . Essential hypertension 03/22/2007  . DEGENERATIVE DISC DISEASE 03/22/2007  . 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 RIGHT/LEFT HEART CATH AND CORONARY ANGIOGRAPHY N/A 06/12/2017   Procedure: RIGHT/LEFT HEART CATH AND CORONARY ANGIOGRAPHY;  Surgeon: Jolaine Artist, MD;  Location: [facility];  Service: Cardiovascular;  Laterality: N/A;        Home Medications     Prior to Admission medications   Medication Sig Start Date End Date Taking? Authorizing Provider  acetaminophen (TYLENOL) 500 MG tablet Take 1,500 mg by mouth 3 (three) times daily as needed for headache (pain).     [provider]  albuterol (PROVENTIL HFA;VENTOLIN HFA) 108 (90 Base) MCG/ACT inhaler Inhale 1-2 puffs into the lungs every 4 (four) hours as needed for wheezing or shortness of breath. 02/23/18   Melynda Ripple, MD  albuterol (PROVENTIL) (2.5 MG/3ML) 0.083% nebulizer solution Take 3 mLs (2.5 mg total) by nebulization every 6 (six) hours as needed for wheezing or shortness of breath. 02/23/18   Melynda Ripple, MD  amiodarone (PACERONE) 200 MG tablet Take 1 tablet (200 mg total) by mouth daily. 07/13/17   Clegg, Amy D, NP  amLODipine (NORVASC) 5 MG tablet Take 1 tablet (5 mg total) by mouth daily. 06/22/18   Larey Dresser, MD  carvedilol (COREG) 3.125 MG tablet TAKE 1 TABLET BY MOUTH TWICE DAILY WITH MEALS 06/21/18   Bensimhon, Shaune Pascal, MD  fluticasone [facility]) 50 MCG/ACT nasal spray Place 2 sprays into both nostrils daily. 02/23/18   Melynda Ripple, MD  furosemide (LASIX) 20 MG tablet Take 20 mg by mouth daily as needed. Weight gain    [provider]  isosorbide-hydrALAZINE (BIDIL) 20-37.5 MG tablet Take 1 tablet in the morning, 1/2 tablet at noon and 1/2 tablet in the evening    [provider]  losartan (COZAAR) 50 MG tablet TAKE 1 TABLET BY MOUTH TWICE  A DAY 02/15/18   Bensimhon, Shaune Pascal, MD  polyethylene glycol powder (GLYCOLAX/MIRALAX) powder Take 17 g by mouth daily as needed for mild constipation.    [provider]  potassium chloride (K-DUR,KLOR-CON) 10 MEQ tablet Take 10 mEq by mouth 3 times/day as needed-between meals & bedtime (with furosemide).    [provider]  predniSONE (DELTASONE) 20 MG tablet Take 2 tablets (40 mg total) by mouth daily. 06/25/18   Montine Circle, PA-C  Spacer/Aero-Holding Chambers  (AEROCHAMBER PLUS) inhaler Use as instructed 02/23/18   Melynda Ripple, MD    Family History Family History  Problem Relation Age of Onset  . Heart disease Father   . Heart disease Daughter        "fluid around heart"     Social History Social History   Tobacco Use  . Smoking status: Former Smoker    Packs/day: 0.10    Years: 45.00    Pack years: 4.50    Last attempt to quit: 2012    Years since quitting: 7.9  . Smokeless tobacco: Never Used  Substance Use Topics  . Alcohol use: No    Frequency: Never    Comment: Pt states no etoh since April 2014  . Drug use: No    Comment: Prior use of crack cocaine, quit 2012     Allergies   Lisinopril and Penicillins   Review of Systems Review of Systems  All other systems reviewed and are negative.    Physical Exam Updated Vital Signs BP 138/71   Pulse (!) 52   Temp 98.2 F (36.8 C) (Oral)   Resp 18   Ht 5\' 11"  (1.803 m)   Wt 70.8 kg   SpO2 97%   BMI 21.76 kg/m   Physical Exam  Constitutional: He is oriented to person, place, and time. He appears well-developed and well-nourished.  HENT:  Head: Normocephalic and atraumatic.  Eyes: Pupils are equal, round, and reactive to light. Conjunctivae and EOM are normal. Right eye exhibits no discharge. Left eye exhibits no discharge. No scleral icterus.  Neck: Normal range of motion. Neck supple. No JVD present.  Cardiovascular: Normal rate, regular rhythm and normal heart sounds. Exam reveals no gallop and no friction rub.  No murmur heard. Pulmonary/Chest: Breath sounds normal. He is in respiratory distress. He has no rales. He exhibits no tenderness.  Bilateral and expiratory wheezes Speaks in 1-2 word phrases  Abdominal: Soft. He exhibits no distension and no mass. There is no tenderness. There is no rebound and no guarding.  Musculoskeletal: Normal range of motion. He exhibits no edema or tenderness.  Neurological: He is alert and oriented to person, place, and  time.  Skin: Skin is warm and dry.  Psychiatric: He has a normal mood and affect. His behavior is normal. Judgment and thought content normal.  Nursing note and vitals reviewed.    ED Treatments / Results  Labs (all labs ordered are listed, but only abnormal results are displayed) Labs Reviewed  CBC WITH DIFFERENTIAL/PLATELET - Abnormal; Notable for the following components:      Result Value   Hemoglobin 12.6 (*)    Neutro Abs 1.4 (*)    Eosinophils Absolute 0.6 (*)    All other components within normal limits  BASIC METABOLIC PANEL - Abnormal; Notable for the following components:   CO2 21 (*)    Creatinine, Ser 1.59 (*)    GFR calc non Af Amer 45 (*)    GFR calc Af Amer 52 (*)  All other components within normal limits  BRAIN NATRIURETIC PEPTIDE - Abnormal; Notable for the following components:   B Natriuretic Peptide 194.6 (*)    All other components within normal limits  I-STAT TROPONIN, ED    EKG EKG Interpretation  Date/Time:  Friday June 25 2018 03:28:16 EST Ventricular Rate:  60 PR Interval:    QRS Duration: 102 QT Interval:  436 QTC Calculation: 436 R Axis:   28 Text Interpretation:  Sinus rhythm Borderline repolarization abnormality diffuse nonspecific t wave changes still noted ventricular bigeminy has resolved Confirmed by Pryor Curia [phone]) on 06/25/2018 3:35:32 AM   Radiology Dg Chest Port 1 View  Result Date: 06/25/2018 CLINICAL DATA:  Shortness of breath. EXAM: PORTABLE CHEST 1 VIEW COMPARISON:  05/24/2018 FINDINGS: Again seen hyperinflation and bronchial thickening. Normal heart size with unchanged mediastinal contours. No focal airspace disease, pleural effusion or pneumothorax. Support apparatus partially obscure evaluation of the left upper lung. IMPRESSION: Chronic hyperinflation and bronchial thickening. No superimposed acute process. Electronically Signed   By: Keith Rake M.D.   On: 06/25/2018 03:45    Procedures Procedures  (including critical care time) CRITICAL CARE Performed by: Montine Circle  Multiple breathing treatments including continuous albuterol treatment, multiple rechecks Total critical care time: 36 minutes  Critical care time was exclusive of separately billable procedures and treating other patients.  Critical care was necessary to treat or prevent imminent or life-threatening deterioration.  Critical care was time spent personally by me on the following activities: development of treatment plan with patient and/or surrogate as well as nursing, discussions with consultants, evaluation of patient's response to treatment, examination of patient, obtaining history from patient or surrogate, ordering and performing treatments and interventions, ordering and review of laboratory studies, ordering and review of radiographic studies, pulse oximetry and re-evaluation of patient's condition.  Medications Ordered in ED Medications  albuterol (PROVENTIL,VENTOLIN) solution continuous neb (10 mg/hr Nebulization New Bag/Given 06/25/18 0416)  ipratropium-albuterol (DUONEB) 0.5-2.5 (3) MG/3ML nebulizer solution 5 mL (5 mLs Nebulization Given 06/25/18 0320)  methylPREDNISolone sodium succinate (SOLU-MEDROL) 125 mg/2 mL injection 125 mg (125 mg Intravenous Given 06/25/18 0320)  ipratropium-albuterol (DUONEB) 0.5-2.5 (3) MG/3ML nebulizer solution (3 mLs  Given 06/25/18 0307)  ondansetron (ZOFRAN) injection 4 mg (4 mg Intravenous Given 06/25/18 0326)     Initial Impression / Assessment and Plan / ED Course  I have reviewed the triage vital signs and the nursing notes.  Pertinent labs & imaging results that were available during my care of the patient were reviewed by me and considered in my medical decision making (see chart for details).    Patient with asthma exacerbation.  Was very short of breath initially.  Cannot speak in complete sentences.  Given to regular breathing treatments with some improvement.   O2 saturation has remained normal on room air.  After second breathing treatment, patient still had significant wheezing.  Patient given continuous albuterol treatment with significant improvement.  He ambulates maintaining greater than 95% pulse oxygenation.  States that he feels well.  Will discharge home with prednisone.  Return precautions discussed   Final Clinical Impressions(s) / ED Diagnoses   Final diagnoses:  COPD exacerbation [facility])    ED Discharge Orders         Ordered    predniSONE (DELTASONE) 20 MG tablet  Daily     06/25/18 0601           Montine Circle, PA-C 06/25/18 0604    Ward, Delice Bison, DO 06/25/18 [phone]

## 2018-07-06 ENCOUNTER — Ambulatory Visit (HOSPITAL_COMMUNITY)
Admission: RE | Admit: 2018-07-06 | Discharge: 2018-07-06 | Disposition: A | Discharge status: Home / Self Care | Payer: Medicare Other | Source: Ambulatory Visit | Attending: Internal Medicine | Admitting: Internal Medicine

## 2018-07-06 VITALS — BP 160/68 | HR 34 | Wt 159.0 lb

## 2018-07-06 DIAGNOSIS — F1421 Cocaine dependence, in remission: Secondary | ICD-10-CM | POA: Diagnosis not present

## 2018-07-06 DIAGNOSIS — K219 Gastro-esophageal reflux disease without esophagitis: Secondary | ICD-10-CM | POA: Diagnosis not present

## 2018-07-06 DIAGNOSIS — I5042 Chronic combined systolic (congestive) and diastolic (congestive) heart failure: Secondary | ICD-10-CM | POA: Diagnosis present

## 2018-07-06 DIAGNOSIS — I110 Hypertensive heart disease with heart failure: Secondary | ICD-10-CM | POA: Diagnosis not present

## 2018-07-06 DIAGNOSIS — E785 Hyperlipidemia, unspecified: Secondary | ICD-10-CM | POA: Diagnosis not present

## 2018-07-06 DIAGNOSIS — Z550 Illiteracy and low-level literacy: Secondary | ICD-10-CM | POA: Diagnosis not present

## 2018-07-06 DIAGNOSIS — Q600 Renal agenesis, unilateral: Secondary | ICD-10-CM | POA: Insufficient documentation

## 2018-07-06 DIAGNOSIS — I428 Other cardiomyopathies: Secondary | ICD-10-CM | POA: Diagnosis not present

## 2018-07-06 DIAGNOSIS — J449 Chronic obstructive pulmonary disease, unspecified: Secondary | ICD-10-CM | POA: Insufficient documentation

## 2018-07-06 DIAGNOSIS — R5383 Other fatigue: Secondary | ICD-10-CM | POA: Diagnosis not present

## 2018-07-06 DIAGNOSIS — I493 Ventricular premature depolarization: Secondary | ICD-10-CM | POA: Insufficient documentation

## 2018-07-06 DIAGNOSIS — N529 Male erectile dysfunction, unspecified: Secondary | ICD-10-CM | POA: Diagnosis not present

## 2018-07-06 LAB — BASIC METABOLIC PANEL
Anion gap: 11 (ref 5–15)
BUN: 21 mg/dL (ref 8–23)
CO2: 19 mmol/L — ABNORMAL LOW (ref 22–32)
Calcium: 9.1 mg/dL (ref 8.9–10.3)
Chloride: 110 mmol/L (ref 98–111)
Creatinine, Ser: 1.46 mg/dL — ABNORMAL HIGH (ref 0.61–1.24)
GFR calc Af Amer: 58 mL/min — ABNORMAL LOW (ref >60)
GFR calc non Af Amer: 50 mL/min — ABNORMAL LOW (ref >60)
Glucose, Bld: 186 mg/dL — ABNORMAL HIGH (ref 70–99)
Potassium: 4.4 mmol/L (ref 3.5–5.1)
Sodium: 140 mmol/L (ref 135–145)

## 2018-07-06 LAB — BRAIN NATRIURETIC PEPTIDE: B Natriuretic Peptide: 516.6 pg/mL — ABNORMAL HIGH (ref 0.0–100.0)

## 2018-07-06 MED ORDER — SPIRONOLACTONE 25 MG PO TABS: 12.5000 mg | ORAL_TABLET | Freq: Every day | ORAL | 3 refills | Status: DC

## 2018-07-06 NOTE — Progress Notes (Signed)
HF MD: [name]  HPI: Joe Quinney Williamsis a 67y.o.malewith PMH of systolic CHF due to NICM, HTN, HLD, Asthma, and solitary kidney. Former crack cocaine abuse.   Admitted 11/14 - 06/15/17 with CP and DOE. Received steroids and nebs. BNP minimally elevated on arrival. Troponin negative. R/LHC below with normal cors and compensated hemodynamics. 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   Re-admitted 11/21-11/23/18 with cramping abdominal pain and GI symptoms. CT abdomen with no negative findings, treated as gastroenteritis. AKI noted with rise of creatinine from 1.0 ->2.0 on admit. Improved to 1.7 at discharge. Pt states he never picked up [facility] after he left the hospital initially.   Seen in ED 09/09/17 with N/V and "chest pain". CP felt like his normal reflux. EKG with PVCs and NSR. Had been seen at home by paramedicine and told HR was in 30s, but normal in 60s on monitor. Likely undercounting due to PVCs. Work up unremarkable.   Pt reports today for pharmacist-led HF medication titration.At last pharmacist-led HF visit on 11/26, he was started on amlodipine 5 mg daily. Had ED visit on 11/29 for asthma exacerbation for which he was given a 5 day course of prednisone which he has not finished yet. He states he feels much better since starting the prednisone. He has not taken any Lasix since his last visit and but he has started weighing himself at home. BP elevated in clinic today and HR in the 30s which he states is normal for him. Will get EKG today. He took all of his medications this morning and reports no missed doses. SOB and orthopnea stable. Denies edema and lightheadedness.   Shortness of breath/dyspnea on exertion?yes- stable  Orthopnea/PND?yes- stable; 2 pillows  Edema?no  Lightheadedness/dizziness?no  Daily weights at home?yes - stable ~151 lb  Blood pressure/heart rate monitoring at home?Yes - 148/59 and 145/46 mmHg and HR 38-39 bpm  Following  low-sodium/fluid-restricted diet?yes  HF Medications: Bidil1/2 tab PO TID Carvedilol 3.125 mg PO BID Furosemide 20 mg PO PRN for weight gain Losartan 50 mg PO BID  Has the patient been experiencing any side effects to the medications prescribed?Yes- Headache worsened this past week  Does the patient have any problems obtaining medications due to transportation or finances?No- [place] Medicaid  Understanding of regimen:good Understanding of indications:good Potential of compliance:fair Patient understands to avoid NSAIDs. Patient understands to avoid decongestants.   Pertinent Lab Values:  07/06/18: Serum creatinine 1.46 (BL 1.3-1.5), BUN 21, Potassium 4.4, Sodium 140, BNP 516  06/22/18: Serum creatinine 1.82 (BL 1.3-1.5), BUN 24, Potassium 4.3, Sodium 139  Vital Signs:  Weight:159 lb(last clinicweight:156 lb)  Blood pressure:160/68 mmHg  Heart rate:34 bpmon pulse ox but 62 on EKG  ReDS Vest: 36%>35%>35%>43%  Assessment: 1. Improved diastolic CHF (KP54-65>68-12%), due toNICM. NYHA classIIIsymptoms.  - Volume status slightly elevated as seen with weight and ReDS Vest reading  - Take furosemide 20 mg x 2 days then continue PRN for weight gain and start spironolactone 12.5 mg daily cautiously with solitary kidney  - Continue BiDil 1/2 tab TID, carvedilol 3.125 mg BID and losartan 50 mg BID - Refused Entresto in the past due to solitary kidney - Basic disease state pathophysiology, medication indication, mechanism and side effects reviewed at length with patient and he verbalized understanding  2. PVCs - Stable. HR 34 on pulse ox but 62 on EKG with PVCs - Continue amio to 200 mg daily and carvedilol.  3. HTN - Uncontrolled - BP remains elevated above goal BP <130/80 -  Start spironolactone 12.5 mg daily cautiously with solitary kidney - Continue amlodipine and other meds as above   4. Solitary Kidney, Congenital - Renal function  improving. Follow carefully with med adjustments.  -BMET today  5. Illiteracy - He can not read pill bottles.Graduated paramedicine.  - Has gotten better at taking his pills.  6. Asthma/COPD -Feels like he is not being managed appropriately by PCP - Referred to pulmonology (Dr. Lake Bells for further management)  7. GERD -Continueprotonix 40 mg daily  8. ED - Cannot do viagra with bidil -Per urology  9. Fatigue -Iron panel WNL at last visit  Plan: 1) Medication changes: Based on clinical presentation, vital signs and recent labs will add spironolactone 12.5 mg daily and take furosemide 20 mg x 1 tonight and tomorrow then continue PRN for weight gain 2) Labs: BMET today and at next visit 3) Follow-up:PharmD visit on 07/12/18 and PA/NP visit on 08/17/18  Tanaya Dunigan K. Velva Harman, PharmD, BCPS, CPP Clinical Pharmacist Phone: [phone] 07/06/2018 1:50 PM

## 2018-07-06 NOTE — Patient Instructions (Signed)
Please take furosemide 20 mg (1 tablet) DAILY FOR 2 DAYS, then take AS NEEDED for weight gain.   Please START spironolactone 12.5 mg (1/2 tablet) DAILY.   Blood work today. We will call you with any changes.   You are scheduled with the pharmacist again next Monday 12/16 at 1:00 PM.   Please keep your appointment with the PA/NP on 08/17/18.

## 2018-07-07 ENCOUNTER — Other Ambulatory Visit (HOSPITAL_COMMUNITY): Payer: Self-pay | Admitting: Pharmacist

## 2018-07-07 MED ORDER — FUROSEMIDE 20 MG PO TABS: 20.0000 mg | ORAL_TABLET | Freq: Every day | ORAL | 5 refills | Status: DC | PRN

## 2018-07-07 MED ORDER — SPIRONOLACTONE 25 MG PO TABS: 12.5000 mg | ORAL_TABLET | Freq: Every day | ORAL | 3 refills | Status: DC

## 2018-07-12 ENCOUNTER — Other Ambulatory Visit (HOSPITAL_COMMUNITY): Payer: Self-pay | Admitting: Internal Medicine

## 2018-07-12 ENCOUNTER — Other Ambulatory Visit (HOSPITAL_COMMUNITY): Payer: Self-pay

## 2018-07-14 ENCOUNTER — Ambulatory Visit (HOSPITAL_COMMUNITY)
Admission: RE | Admit: 2018-07-14 | Discharge: 2018-07-14 | Disposition: A | Discharge status: Home / Self Care | Payer: Medicare Other | Source: Ambulatory Visit | Attending: Cardiology | Admitting: Cardiology

## 2018-07-14 VITALS — BP 160/82 | HR 59 | Wt 161.4 lb

## 2018-07-14 DIAGNOSIS — Z550 Illiteracy and low-level literacy: Secondary | ICD-10-CM | POA: Insufficient documentation

## 2018-07-14 DIAGNOSIS — I428 Other cardiomyopathies: Secondary | ICD-10-CM | POA: Insufficient documentation

## 2018-07-14 DIAGNOSIS — K219 Gastro-esophageal reflux disease without esophagitis: Secondary | ICD-10-CM | POA: Insufficient documentation

## 2018-07-14 DIAGNOSIS — J449 Chronic obstructive pulmonary disease, unspecified: Secondary | ICD-10-CM | POA: Diagnosis not present

## 2018-07-14 DIAGNOSIS — I110 Hypertensive heart disease with heart failure: Secondary | ICD-10-CM | POA: Insufficient documentation

## 2018-07-14 DIAGNOSIS — Q600 Renal agenesis, unilateral: Secondary | ICD-10-CM | POA: Diagnosis not present

## 2018-07-14 DIAGNOSIS — R5383 Other fatigue: Secondary | ICD-10-CM | POA: Insufficient documentation

## 2018-07-14 DIAGNOSIS — I493 Ventricular premature depolarization: Secondary | ICD-10-CM | POA: Diagnosis not present

## 2018-07-14 DIAGNOSIS — I5042 Chronic combined systolic (congestive) and diastolic (congestive) heart failure: Secondary | ICD-10-CM | POA: Diagnosis present

## 2018-07-14 LAB — BASIC METABOLIC PANEL
Anion gap: 10 (ref 5–15)
BUN: 17 mg/dL (ref 8–23)
CALCIUM: 8.6 mg/dL — AB (ref 8.9–10.3)
CO2: 23 mmol/L (ref 22–32)
Chloride: 109 mmol/L (ref 98–111)
Creatinine, Ser: 1.51 mg/dL — ABNORMAL HIGH (ref 0.61–1.24)
GFR calc Af Amer: 55 mL/min — ABNORMAL LOW (ref >60)
GFR calc non Af Amer: 48 mL/min — ABNORMAL LOW (ref >60)
Glucose, Bld: 100 mg/dL — ABNORMAL HIGH (ref 70–99)
Potassium: 3.7 mmol/L (ref 3.5–5.1)
SODIUM: 142 mmol/L (ref 135–145)

## 2018-07-14 LAB — BRAIN NATRIURETIC PEPTIDE: B Natriuretic Peptide: 236.6 pg/mL — ABNORMAL HIGH (ref 0.0–100.0)

## 2018-07-14 MED ORDER — AMLODIPINE BESYLATE 10 MG PO TABS: 10.0000 mg | ORAL_TABLET | Freq: Every day | ORAL | 5 refills | Status: DC

## 2018-07-14 NOTE — Progress Notes (Signed)
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 Former crack cocaine abuse.   Admitted 11/14 - 06/15/17 with CP and DOE. Received steroids and nebs. BNP minimally elevated on arrival. Troponin negative. R/LHC below with normal cors and compensated hemodynamics. 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   Re-admitted 11/21-11/23/18 with cramping abdominal pain and GI symptoms. CT abdomen with no negative findings, treated as gastroenteritis. AKI noted with rise of creatinine from 1.0 ->2.0 on admit. Improved to 1.7 at discharge. Pt states he never picked up [facility] after he left the hospital initially.   Seen in ED 09/09/17 with N/V and "chest pain". CP felt like his normal reflux. EKG with PVCs and NSR. Had been seen at home by paramedicine and told HR was in 30s, but normal in 60s on monitor. Likely undercounting due to PVCs. Work up unremarkable.   Pt reports today for pharmacist-led HF medication titration.At last pharmacist-led HF visit on 12/10, he was started on spironolactone 12.5 mg daily and was instructed to take furosemide 20 mg x 2 days then continue PRN for weight gain. Had ED visit on 11/29 for asthma exacerbation for which he was given a 5 day course of prednisone which he finished last week. His weight at home has been slowly trending up but he has been eating more cake lately. He took all of his medications this morning and reports no missed doses. SOB and orthopnea stable. Denies edema and lightheadedness although his stomach has felt tighter recently (has had some issues with constipation recently).    Shortness of breath/dyspnea on exertion?yes- stable  Orthopnea/PND?yes- stable; 2 pillows  Edema?no  Lightheadedness/dizziness?no  Daily weights at home?yes - stable ~151>154 lb  Blood pressure/heart rate monitoring at home?Yes - 141/47 mmHg and HR 40  bpm  Following low-sodium/fluid-restricted diet?yes  HF Medications: Bidil1/2 tab QAM, 1 tab Qnoon and 1/2 tab QPM Carvedilol 3.125 mg PO BID Furosemide 20 mg PO PRN for weight gain Losartan 50 mg PO BID Spironolactone 12.5 mg PO daily  Has the patient been experiencing any side effects to the medications prescribed?No  Does the patient have any problems obtaining medications due to transportation or finances?No- [place] Medicaid  Understanding of regimen:good Understanding of indications:good Potential of compliance:fair Patient understands to avoid NSAIDs. Patient understands to avoid decongestants.   Pertinent Lab Values:  07/14/18: Serum creatinine 1.51(BL 1.3-1.5), BUN 17, Potassium 3.7, Sodium 142, BNP  07/06/18: Serum creatinine 1.46(BL 1.3-1.5), BUN 21, Potassium 4.4, Sodium 140, BNP 516  Vital Signs:  Weight:161.4 lb(last clinicweight:156 lb)  Blood pressure:160/29mmHg  Heart rate:59 bpm  ReDS Vest: 36%>35%>35%>43%>29% today  Assessment: 1.Improved diastolicCHF (SF68-12>75-17%), due toNICM. NYHA classIIIsymptoms.  - Volume status much improved since last week with improvement in ReDS vest; weight gain may be due to increased intake of cake  - Continue furosemide 20 mg daily PRN weight gain, Bidil 1/2 tab QAM/1 tab Qnoon/1/2 tab QPM, carvedilol 3.125 mg BID, losartan 50 mg BID and spironolactone 12.5 mg daily - Refused Entresto in the past due to solitary kidney - Basic disease state pathophysiology, medication indication, mechanism and side effects reviewed at length with patient and he verbalized understanding  2. PVCs - Stable - Continue amio to 200 mg daily and carvedilol.  3. HTN - Uncontrolled -BP remains elevated above goalBP<130/80 - Increase amlodipine to 10 mg daily - May consider increasing spironolactone at next visit as long as BMET remains stable  4. Solitary Kidney, Congenital -Renal function  improving.Follow carefully with med adjustments.  -BMET today stable  5. Illiteracy - He can not read pill bottles.Graduated paramedicine.  - Has gotten better at taking his pills.  6. Asthma/COPD -Feels like he is not being managed appropriately by PCP - Referred to pulmonology (Dr. Lake Bells for further management), has appt in January  7. GERD -Continueprotonix 40 mg daily  8. ED - Cannot do viagra with bidil -Per urology  9. Fatigue -Iron panel WNL at last visit  Plan: 1) Medication changes: Based on clinical presentation, vital signs and recent labs willincrease amlodipine to 10 mg daily  2) Labs:BMETtoday 3) Follow-up:PA/NP visit on 08/17/18  Michalina Calbert K. Velva Harman, PharmD, BCPS, CPP Clinical Pharmacist Phone: [phone] 07/14/2018 1:07 PM

## 2018-07-14 NOTE — Patient Instructions (Signed)
Please INCREASE your amlodipine 10 mg ONCE DAILY.   Blood work today. We will call you with any changes.   Please keep your appointment with our NP/PA on 08/17/18.

## 2018-07-19 ENCOUNTER — Other Ambulatory Visit (HOSPITAL_COMMUNITY): Payer: Self-pay | Admitting: Internal Medicine

## 2018-07-20 ENCOUNTER — Ambulatory Visit (INDEPENDENT_AMBULATORY_CARE_PROVIDER_SITE_OTHER): Payer: Medicare Other

## 2018-07-20 ENCOUNTER — Encounter (HOSPITAL_COMMUNITY): Payer: Self-pay | Admitting: *Deleted

## 2018-07-20 ENCOUNTER — Ambulatory Visit (HOSPITAL_COMMUNITY)
Admission: EM | Admit: 2018-07-20 | Discharge: 2018-07-20 | Disposition: A | Discharge status: Home / Self Care | Payer: Medicare Other | Attending: Family Medicine | Admitting: Family Medicine

## 2018-07-20 ENCOUNTER — Other Ambulatory Visit: Payer: Self-pay

## 2018-07-20 DIAGNOSIS — J4541 Moderate persistent asthma with (acute) exacerbation: Secondary | ICD-10-CM | POA: Diagnosis not present

## 2018-07-20 DIAGNOSIS — R05 Cough: Secondary | ICD-10-CM | POA: Diagnosis not present

## 2018-07-20 DIAGNOSIS — J40 Bronchitis, not specified as acute or chronic: Secondary | ICD-10-CM | POA: Insufficient documentation

## 2018-07-20 DIAGNOSIS — I10 Essential (primary) hypertension: Secondary | ICD-10-CM

## 2018-07-20 MED ORDER — IPRATROPIUM-ALBUTEROL 0.5-2.5 (3) MG/3ML IN SOLN
3.0000 mL | Freq: Once | RESPIRATORY_TRACT | Status: AC
  Administered 2018-07-20: 3 mL via RESPIRATORY_TRACT

## 2018-07-20 MED ORDER — ALBUTEROL SULFATE HFA 108 (90 BASE) MCG/ACT IN AERS
INHALATION_SPRAY | RESPIRATORY_TRACT | Status: AC
  Filled 2018-07-20: qty 6.7

## 2018-07-20 MED ORDER — IPRATROPIUM-ALBUTEROL 0.5-2.5 (3) MG/3ML IN SOLN
RESPIRATORY_TRACT | Status: AC
  Filled 2018-07-20: qty 3

## 2018-07-20 MED ORDER — METHYLPREDNISOLONE ACETATE 80 MG/ML IJ SUSP
INTRAMUSCULAR | Status: AC
  Filled 2018-07-20: qty 1

## 2018-07-20 MED ORDER — METHYLPREDNISOLONE ACETATE 80 MG/ML IJ SUSP
80.0000 mg | Freq: Once | INTRAMUSCULAR | Status: AC
  Administered 2018-07-20: 80 mg via INTRAMUSCULAR

## 2018-07-20 MED ORDER — BENZONATATE 100 MG PO CAPS: 100.0000 mg | ORAL_CAPSULE | Freq: Three times a day (TID) | ORAL | 0 refills | Status: DC | PRN

## 2018-07-20 MED ORDER — ALBUTEROL SULFATE HFA 108 (90 BASE) MCG/ACT IN AERS
2.0000 | INHALATION_SPRAY | Freq: Once | RESPIRATORY_TRACT | Status: AC
  Administered 2018-07-20: 2 via RESPIRATORY_TRACT

## 2018-07-20 NOTE — ED Provider Notes (Signed)
[place]    CSN: [id number] Arrival date & time: 07/20/18  1523     History   Chief Complaint Chief Complaint  Patient presents with  . Cough  . Shortness of Breath    HPI Joe Garcia is a 67 y.o. male.   Is a 67 year old gentleman with asthma who comes in with 1 day of severe cough to the point of vomiting.  He has a history of alcohol abuse, chronic kidney disease, hypertension, and asthma.  Problem list includes many redundancies.     Past Medical History:  Diagnosis Date  . Alcoholism ([place])   . Arthritis    hips, L shoulder  . Asthma   . CHF (congestive heart failure) ([place])   . Depression   . Family history of anesthesia complication   . Hyperlipidemia   . Hypertension     Patient Active Problem List   Diagnosis Date Noted  . Malnutrition of moderate degree 07/16/2017  . 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 Solitary kidney, congenital 06/23/2017  . Nonischemic cardiomyopathy ([place])   . Multifocal PVCs   . Chronic combined systolic (congestive) and diastolic (congestive) heart failure ([place]) 06/11/2017  . Influenza A 07/17/2013  . Influenza 07/16/2013  . Tachycardia 07/16/2013  . Alcohol abuse 09/25/2012  . Alcohol dependence ([place]) 09/22/2012  . Essential hypertension 03/22/2007  . DEGENERATIVE DISC DISEASE 03/22/2007  . 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 RIGHT/LEFT HEART CATH AND CORONARY ANGIOGRAPHY N/A 06/12/2017   Procedure: RIGHT/LEFT HEART CATH AND CORONARY ANGIOGRAPHY;  Surgeon: Jolaine Artist, MD;  Location: [facility];  Service: Cardiovascular;  Laterality: N/A;       Home Medications    Prior to Admission medications   Medication Sig Start Date End Date Taking? Authorizing Provider  albuterol (PROVENTIL HFA;VENTOLIN HFA) 108 (90 Base) MCG/ACT inhaler Inhale 1-2 puffs into the lungs every 4 (four) hours as needed for wheezing or shortness  of breath. 02/23/18  Yes Melynda Ripple, MD  albuterol (PROVENTIL) (2.5 MG/3ML) 0.083% nebulizer solution Take 3 mLs (2.5 mg total) by nebulization every 6 (six) hours as needed for wheezing or shortness of breath. 02/23/18  Yes Melynda Ripple, MD  amiodarone (PACERONE) 200 MG tablet Take 1 tablet (200 mg total) by mouth daily. 07/13/17  Yes Clegg, Amy D, NP  amLODipine (NORVASC) 10 MG tablet Take 1 tablet (10 mg total) by mouth daily. 07/14/18  Yes Bensimhon, Shaune Pascal, MD  carvedilol (COREG) 3.125 MG tablet TAKE 1 TABLET BY MOUTH TWICE DAILY WITH MEALS 07/19/18  Yes Bensimhon, Shaune Pascal, MD  furosemide (LASIX) 20 MG tablet Take 1 tablet (20 mg total) by mouth daily as needed (weight gain). Weight gain 07/07/18  Yes Bensimhon, Shaune Pascal, MD  isosorbide-hydrALAZINE (BIDIL) 20-37.5 MG tablet Take 1/2 tablet every morning, 1 tablet at noon and 1/2 tablet in the evening   Yes [provider]  losartan (COZAAR) 50 MG tablet TAKE 1 TABLET BY MOUTH TWICE A DAY 07/19/18  Yes Bensimhon, Shaune Pascal, MD  spironolactone (ALDACTONE) 25 MG tablet Take 0.5 tablets (12.5 mg total) by mouth daily. 07/07/18 10/05/18 Yes Bensimhon, Shaune Pascal, MD  acetaminophen (TYLENOL) 500 MG tablet Take 1,500 mg by mouth 3 (three) times daily as needed for headache (pain).     [provider]  benzonatate (TESSALON) 100 MG capsule Take 1-2 capsules (100-200 mg total) by mouth 3 (three) times daily as needed for  cough. 07/20/18   Robyn Haber, MD  fluticasone (FLONASE) 50 MCG/ACT nasal spray Place 2 sprays into both nostrils daily. 02/23/18   Melynda Ripple, MD  polyethylene glycol powder (GLYCOLAX/MIRALAX) powder Take 17 g by mouth daily as needed for mild constipation.    [provider]  Spacer/Aero-Holding Chambers (AEROCHAMBER PLUS) inhaler Use as instructed 02/23/18   Melynda Ripple, MD    Family History Family History  Problem Relation Age of Onset  . Heart disease Father   . Heart disease  Daughter        "fluid around heart"     Social History Social History   Tobacco Use  . Smoking status: Former Smoker    Packs/day: 0.10    Years: 45.00    Pack years: 4.50    Last attempt to quit: 2012    Years since quitting: 7.9  . Smokeless tobacco: Never Used  Substance Use Topics  . Alcohol use: No    Frequency: Never    Comment: Pt states no etoh since April 2014  . Drug use: No    Comment: Prior use of crack cocaine, quit 2012     Allergies   Lisinopril and Penicillins   Review of Systems Review of Systems  Constitutional: Positive for diaphoresis.  Respiratory: Positive for cough, chest tightness and wheezing.   Gastrointestinal: Positive for vomiting.  All other systems reviewed and are negative.    Physical Exam Triage Vital Signs ED Triage Vitals  Enc Vitals Group     BP      Pulse      Resp      Temp      Temp src      SpO2      Weight      Height      Head Circumference      Peak Flow      Pain Score      Pain Loc      Pain Edu?      Excl. in [place]?    No data found.  Updated Vital Signs BP (!) 164/86   Pulse (!) 103   Temp 99.7 F (37.6 C) (Oral)   Resp (!) 22   SpO2 100%    Physical Exam Vitals signs and nursing note reviewed.  Constitutional:      General: He is not in acute distress.    Appearance: He is well-developed. He is ill-appearing and diaphoretic.  HENT:     Head: Normocephalic.     Mouth/Throat:     Mouth: Mucous membranes are moist.  Neck:     Musculoskeletal: Normal range of motion and neck supple.  Cardiovascular:     Rate and Rhythm: Regular rhythm. Tachycardia present.     Heart sounds: Normal heart sounds.  Pulmonary:     Effort: Accessory muscle usage present.     Breath sounds: Examination of the right-lower field reveals wheezing and rhonchi. Examination of the left-lower field reveals wheezing and rhonchi. Wheezing and rhonchi present.  Musculoskeletal: Normal range of motion.  Skin:    General:  Skin is warm.  Neurological:     General: No focal deficit present.     Mental Status: He is alert.      UC Treatments / Results  Labs (all labs ordered are listed, but only abnormal results are displayed) Labs Reviewed - No data to display  EKG None  Radiology Dg Chest 2 View  Result Date: 07/20/2018 CLINICAL DATA:  Cough short  of breath EXAM: CHEST - 2 VIEW COMPARISON:  06/25/2018 FINDINGS: Marked COPD and hyperinflation with changes of emphysema. Negative for infiltrate effusion or mass. Negative for heart failure. Mild apical scarring bilaterally head IMPRESSION: COPD without acute radiographic abnormality. Electronically Signed   By: Franchot Gallo M.D.   On: 07/20/2018 15:57    Procedures Procedures (including critical care time)  Medications Ordered in UC Medications  methylPREDNISolone acetate (DEPO-MEDROL) injection 80 mg (has no administration in time range)  albuterol (PROVENTIL HFA;VENTOLIN HFA) 108 (90 Base) MCG/ACT inhaler 2 puff (has no administration in time range)  ipratropium-albuterol (DUONEB) 0.5-2.5 (3) MG/3ML nebulizer solution 3 mL (has no administration in time range)    Initial Impression / Assessment and Plan / UC Course  I have reviewed the triage vital signs and the nursing notes.  Pertinent labs & imaging results that were available during my care of the patient were reviewed by me and considered in my medical decision making (see chart for details).    Final Clinical Impressions(s) / UC Diagnoses   Final diagnoses:  Bronchitis  Moderate persistent asthma with acute exacerbation     Discharge Instructions     Drink plenty of fluids.  Return if symptoms do not improve in next 48 hours    ED Prescriptions    Medication Sig Dispense Auth. Provider   benzonatate (TESSALON) 100 MG capsule Take 1-2 capsules (100-200 mg total) by mouth 3 (three) times daily as needed for cough. 40 capsule Robyn Haber, MD     Controlled Substance  Prescriptions  Controlled Substance Registry consulted? Not Applicable   Robyn Haber, MD 07/20/18 [id number]

## 2018-07-20 NOTE — Discharge Instructions (Addendum)
Drink plenty of fluids.  Return if symptoms do not improve in next 48 hours

## 2018-07-20 NOTE — ED Triage Notes (Signed)
C/O "non-stop" coughing with SOB with ambulation over past couple days and chills.

## 2018-07-25 ENCOUNTER — Encounter (HOSPITAL_COMMUNITY): Payer: Self-pay | Admitting: Emergency Medicine

## 2018-07-25 ENCOUNTER — Other Ambulatory Visit: Payer: Self-pay

## 2018-07-25 DIAGNOSIS — Z6822 Body mass index (BMI) 22.0-22.9, adult: Secondary | ICD-10-CM

## 2018-07-25 DIAGNOSIS — Z87891 Personal history of nicotine dependence: Secondary | ICD-10-CM

## 2018-07-25 DIAGNOSIS — N182 Chronic kidney disease, stage 2 (mild): Secondary | ICD-10-CM | POA: Diagnosis present

## 2018-07-25 DIAGNOSIS — I5022 Chronic systolic (congestive) heart failure: Secondary | ICD-10-CM | POA: Diagnosis present

## 2018-07-25 DIAGNOSIS — Z79899 Other long term (current) drug therapy: Secondary | ICD-10-CM

## 2018-07-25 DIAGNOSIS — Z7951 Long term (current) use of inhaled steroids: Secondary | ICD-10-CM

## 2018-07-25 DIAGNOSIS — N179 Acute kidney failure, unspecified: Secondary | ICD-10-CM | POA: Diagnosis present

## 2018-07-25 DIAGNOSIS — I130 Hypertensive heart and chronic kidney disease with heart failure and stage 1 through stage 4 chronic kidney disease, or unspecified chronic kidney disease: Secondary | ICD-10-CM | POA: Diagnosis present

## 2018-07-25 DIAGNOSIS — I493 Ventricular premature depolarization: Secondary | ICD-10-CM | POA: Diagnosis present

## 2018-07-25 DIAGNOSIS — I428 Other cardiomyopathies: Secondary | ICD-10-CM | POA: Diagnosis present

## 2018-07-25 DIAGNOSIS — E861 Hypovolemia: Secondary | ICD-10-CM | POA: Diagnosis present

## 2018-07-25 DIAGNOSIS — J441 Chronic obstructive pulmonary disease with (acute) exacerbation: Secondary | ICD-10-CM | POA: Diagnosis not present

## 2018-07-25 DIAGNOSIS — E875 Hyperkalemia: Secondary | ICD-10-CM | POA: Diagnosis present

## 2018-07-25 DIAGNOSIS — E440 Moderate protein-calorie malnutrition: Secondary | ICD-10-CM | POA: Diagnosis present

## 2018-07-25 DIAGNOSIS — Z888 Allergy status to other drugs, medicaments and biological substances status: Secondary | ICD-10-CM

## 2018-07-25 DIAGNOSIS — Z880 Allergy status to penicillin: Secondary | ICD-10-CM

## 2018-07-25 DIAGNOSIS — E785 Hyperlipidemia, unspecified: Secondary | ICD-10-CM | POA: Diagnosis present

## 2018-07-25 DIAGNOSIS — D72819 Decreased white blood cell count, unspecified: Secondary | ICD-10-CM | POA: Diagnosis present

## 2018-07-25 DIAGNOSIS — F1020 Alcohol dependence, uncomplicated: Secondary | ICD-10-CM | POA: Diagnosis present

## 2018-07-25 MED ORDER — ALBUTEROL SULFATE (2.5 MG/3ML) 0.083% IN NEBU
5.0000 mg | INHALATION_SOLUTION | Freq: Once | RESPIRATORY_TRACT | Status: AC
  Administered 2018-07-26: 5 mg via RESPIRATORY_TRACT
  Filled 2018-07-25: qty 6

## 2018-07-25 NOTE — ED Triage Notes (Signed)
C/o productive cough with yellow and white phlegm, sob, and wheezing x 3-4 days.  History of asthma.

## 2018-07-26 ENCOUNTER — Emergency Department (HOSPITAL_COMMUNITY): Payer: Medicare Other

## 2018-07-26 ENCOUNTER — Inpatient Hospital Stay (HOSPITAL_COMMUNITY)
Admission: EM | Admit: 2018-07-26 | Discharge: 2018-07-28 | DRG: 191 | Disposition: A | Discharge status: Home / Self Care | Payer: Medicare Other | Attending: Internal Medicine | Admitting: Internal Medicine

## 2018-07-26 DIAGNOSIS — R112 Nausea with vomiting, unspecified: Secondary | ICD-10-CM | POA: Diagnosis not present

## 2018-07-26 DIAGNOSIS — N179 Acute kidney failure, unspecified: Secondary | ICD-10-CM | POA: Diagnosis present

## 2018-07-26 DIAGNOSIS — Z87891 Personal history of nicotine dependence: Secondary | ICD-10-CM | POA: Diagnosis not present

## 2018-07-26 DIAGNOSIS — N178 Other acute kidney failure: Secondary | ICD-10-CM | POA: Diagnosis not present

## 2018-07-26 DIAGNOSIS — I5022 Chronic systolic (congestive) heart failure: Secondary | ICD-10-CM | POA: Diagnosis present

## 2018-07-26 DIAGNOSIS — D72819 Decreased white blood cell count, unspecified: Secondary | ICD-10-CM | POA: Diagnosis present

## 2018-07-26 DIAGNOSIS — J441 Chronic obstructive pulmonary disease with (acute) exacerbation: Secondary | ICD-10-CM | POA: Diagnosis present

## 2018-07-26 DIAGNOSIS — E440 Moderate protein-calorie malnutrition: Secondary | ICD-10-CM | POA: Diagnosis not present

## 2018-07-26 DIAGNOSIS — R634 Abnormal weight loss: Secondary | ICD-10-CM | POA: Diagnosis present

## 2018-07-26 DIAGNOSIS — I130 Hypertensive heart and chronic kidney disease with heart failure and stage 1 through stage 4 chronic kidney disease, or unspecified chronic kidney disease: Secondary | ICD-10-CM | POA: Diagnosis present

## 2018-07-26 DIAGNOSIS — Z888 Allergy status to other drugs, medicaments and biological substances status: Secondary | ICD-10-CM | POA: Diagnosis not present

## 2018-07-26 DIAGNOSIS — E875 Hyperkalemia: Secondary | ICD-10-CM | POA: Diagnosis present

## 2018-07-26 DIAGNOSIS — I428 Other cardiomyopathies: Secondary | ICD-10-CM | POA: Diagnosis present

## 2018-07-26 DIAGNOSIS — Z79899 Other long term (current) drug therapy: Secondary | ICD-10-CM | POA: Diagnosis not present

## 2018-07-26 DIAGNOSIS — E785 Hyperlipidemia, unspecified: Secondary | ICD-10-CM | POA: Diagnosis present

## 2018-07-26 DIAGNOSIS — I10 Essential (primary) hypertension: Secondary | ICD-10-CM | POA: Diagnosis not present

## 2018-07-26 DIAGNOSIS — F1020 Alcohol dependence, uncomplicated: Secondary | ICD-10-CM | POA: Diagnosis present

## 2018-07-26 DIAGNOSIS — Z7951 Long term (current) use of inhaled steroids: Secondary | ICD-10-CM | POA: Diagnosis not present

## 2018-07-26 DIAGNOSIS — E861 Hypovolemia: Secondary | ICD-10-CM | POA: Diagnosis present

## 2018-07-26 DIAGNOSIS — Z6822 Body mass index (BMI) 22.0-22.9, adult: Secondary | ICD-10-CM | POA: Diagnosis not present

## 2018-07-26 DIAGNOSIS — N182 Chronic kidney disease, stage 2 (mild): Secondary | ICD-10-CM | POA: Diagnosis not present

## 2018-07-26 DIAGNOSIS — Z880 Allergy status to penicillin: Secondary | ICD-10-CM | POA: Diagnosis not present

## 2018-07-26 DIAGNOSIS — I493 Ventricular premature depolarization: Secondary | ICD-10-CM | POA: Diagnosis present

## 2018-07-26 LAB — BASIC METABOLIC PANEL
Anion gap: 12 (ref 5–15)
Anion gap: 12 (ref 5–15)
BUN: 33 mg/dL — AB (ref 8–23)
BUN: 23 mg/dL (ref 8–23)
CHLORIDE: 106 mmol/L (ref 98–111)
CO2: 19 mmol/L — ABNORMAL LOW (ref 22–32)
CO2: 15 mmol/L — ABNORMAL LOW (ref 22–32)
Calcium: 8.6 mg/dL — ABNORMAL LOW (ref 8.9–10.3)
Calcium: 8.1 mg/dL — ABNORMAL LOW (ref 8.9–10.3)
Chloride: 106 mmol/L (ref 98–111)
Creatinine, Ser: 2.05 mg/dL — ABNORMAL HIGH (ref 0.61–1.24)
Creatinine, Ser: 1.71 mg/dL — ABNORMAL HIGH (ref 0.61–1.24)
GFR calc Af Amer: 38 mL/min — ABNORMAL LOW (ref >60)
GFR calc Af Amer: 48 mL/min — ABNORMAL LOW (ref >60)
GFR calc non Af Amer: 33 mL/min — ABNORMAL LOW (ref >60)
GFR calc non Af Amer: 41 mL/min — ABNORMAL LOW (ref >60)
Glucose, Bld: 104 mg/dL — ABNORMAL HIGH (ref 70–99)
Glucose, Bld: 195 mg/dL — ABNORMAL HIGH (ref 70–99)
Potassium: 5.4 mmol/L — ABNORMAL HIGH (ref 3.5–5.1)
Potassium: 4.2 mmol/L (ref 3.5–5.1)
Sodium: 137 mmol/L (ref 135–145)
Sodium: 133 mmol/L — ABNORMAL LOW (ref 135–145)

## 2018-07-26 LAB — CBC WITH DIFFERENTIAL/PLATELET
ABS IMMATURE GRANULOCYTES: 0.01 10*3/uL (ref 0.00–0.07)
Basophils Absolute: 0 10*3/uL (ref 0.0–0.1)
Basophils Relative: 0 %
Eosinophils Absolute: 0 10*3/uL (ref 0.0–0.5)
Eosinophils Relative: 0 %
HCT: 43.5 % (ref 39.0–52.0)
Hemoglobin: 14 g/dL (ref 13.0–17.0)
Immature Granulocytes: 0 %
Lymphocytes Relative: 51 %
Lymphs Abs: 2 10*3/uL (ref 0.7–4.0)
MCH: 26.4 pg (ref 26.0–34.0)
MCHC: 32.2 g/dL (ref 30.0–36.0)
MCV: 81.9 fL (ref 80.0–100.0)
MONOS PCT: 7 %
Monocytes Absolute: 0.3 10*3/uL (ref 0.1–1.0)
Neutro Abs: 1.6 10*3/uL — ABNORMAL LOW (ref 1.7–7.7)
Neutrophils Relative %: 42 %
Platelets: 196 10*3/uL (ref 150–400)
RBC: 5.31 MIL/uL (ref 4.22–5.81)
RDW: 15.6 % — ABNORMAL HIGH (ref 11.5–15.5)
WBC: 3.9 10*3/uL — ABNORMAL LOW (ref 4.0–10.5)
nRBC: 0 % (ref 0.0–0.2)

## 2018-07-26 LAB — CREATININE, SERUM
Creatinine, Ser: 1.89 mg/dL — ABNORMAL HIGH (ref 0.61–1.24)
GFR calc Af Amer: 42 mL/min — ABNORMAL LOW (ref >60)
GFR calc non Af Amer: 36 mL/min — ABNORMAL LOW (ref >60)

## 2018-07-26 LAB — CBC
HCT: 41.7 % (ref 39.0–52.0)
Hemoglobin: 13.5 g/dL (ref 13.0–17.0)
MCH: 27 pg (ref 26.0–34.0)
MCHC: 32.4 g/dL (ref 30.0–36.0)
MCV: 83.4 fL (ref 80.0–100.0)
Platelets: 183 10*3/uL (ref 150–400)
RBC: 5 MIL/uL (ref 4.22–5.81)
RDW: 15.7 % — ABNORMAL HIGH (ref 11.5–15.5)
WBC: 2.8 10*3/uL — ABNORMAL LOW (ref 4.0–10.5)
nRBC: 0 % (ref 0.0–0.2)

## 2018-07-26 MED ORDER — DOXYCYCLINE HYCLATE 100 MG PO TABS
100.0000 mg | ORAL_TABLET | Freq: Two times a day (BID) | ORAL | Status: DC
  Administered 2018-07-26 – 2018-07-27 (×3): 100 mg via ORAL
  Filled 2018-07-26 (×3): qty 1

## 2018-07-26 MED ORDER — CARVEDILOL 3.125 MG PO TABS
3.1250 mg | ORAL_TABLET | Freq: Two times a day (BID) | ORAL | Status: DC
  Administered 2018-07-26 – 2018-07-28 (×5): 3.125 mg via ORAL
  Filled 2018-07-26 (×6): qty 1

## 2018-07-26 MED ORDER — IPRATROPIUM-ALBUTEROL 0.5-2.5 (3) MG/3ML IN SOLN
RESPIRATORY_TRACT | Status: AC
  Filled 2018-07-26: qty 3

## 2018-07-26 MED ORDER — HEPARIN SODIUM (PORCINE) 5000 UNIT/ML IJ SOLN
5000.0000 [IU] | Freq: Three times a day (TID) | INTRAMUSCULAR | Status: DC
  Administered 2018-07-26 – 2018-07-28 (×6): 5000 [IU] via SUBCUTANEOUS
  Filled 2018-07-26 (×6): qty 1

## 2018-07-26 MED ORDER — ACETAMINOPHEN 650 MG RE SUPP: 650.0000 mg | Freq: Four times a day (QID) | RECTAL | Status: DC | PRN

## 2018-07-26 MED ORDER — ONDANSETRON HCL 4 MG PO TABS: 4.0000 mg | ORAL_TABLET | Freq: Four times a day (QID) | ORAL | Status: DC | PRN

## 2018-07-26 MED ORDER — ACETAMINOPHEN 325 MG PO TABS: 650.0000 mg | ORAL_TABLET | Freq: Four times a day (QID) | ORAL | Status: DC | PRN

## 2018-07-26 MED ORDER — METHYLPREDNISOLONE SODIUM SUCC 125 MG IJ SOLR
80.0000 mg | Freq: Four times a day (QID) | INTRAMUSCULAR | Status: DC
  Administered 2018-07-26 – 2018-07-28 (×9): 80 mg via INTRAVENOUS
  Filled 2018-07-26 (×9): qty 2

## 2018-07-26 MED ORDER — ENOXAPARIN SODIUM 40 MG/0.4ML ~~LOC~~ SOLN: 40.0000 mg | SUBCUTANEOUS | Status: DC

## 2018-07-26 MED ORDER — ALBUTEROL (5 MG/ML) CONTINUOUS INHALATION SOLN
10.0000 mg/h | INHALATION_SOLUTION | Freq: Once | RESPIRATORY_TRACT | Status: AC
  Administered 2018-07-26: 10 mg/h via RESPIRATORY_TRACT
  Filled 2018-07-26: qty 20

## 2018-07-26 MED ORDER — IPRATROPIUM-ALBUTEROL 0.5-2.5 (3) MG/3ML IN SOLN
3.0000 mL | RESPIRATORY_TRACT | Status: DC
  Administered 2018-07-26 (×3): 3 mL via RESPIRATORY_TRACT
  Filled 2018-07-26 (×3): qty 3

## 2018-07-26 MED ORDER — ALBUTEROL SULFATE (2.5 MG/3ML) 0.083% IN NEBU: 2.5000 mg | INHALATION_SOLUTION | RESPIRATORY_TRACT | Status: DC | PRN

## 2018-07-26 MED ORDER — AMLODIPINE BESYLATE 10 MG PO TABS
10.0000 mg | ORAL_TABLET | Freq: Every day | ORAL | Status: DC
  Administered 2018-07-26 – 2018-07-28 (×3): 10 mg via ORAL
  Filled 2018-07-26 (×2): qty 1
  Filled 2018-07-26: qty 2

## 2018-07-26 MED ORDER — ALBUTEROL (5 MG/ML) CONTINUOUS INHALATION SOLN
10.0000 mg/h | INHALATION_SOLUTION | Freq: Once | RESPIRATORY_TRACT | Status: AC
  Administered 2018-07-26: 10 mg/h via RESPIRATORY_TRACT

## 2018-07-26 MED ORDER — SODIUM CHLORIDE 0.9 % IV SOLN
INTRAVENOUS | Status: DC
  Administered 2018-07-26 – 2018-07-27 (×3): via INTRAVENOUS

## 2018-07-26 MED ORDER — AMIODARONE HCL 200 MG PO TABS
200.0000 mg | ORAL_TABLET | Freq: Every day | ORAL | Status: DC
  Administered 2018-07-26 – 2018-07-28 (×3): 200 mg via ORAL
  Filled 2018-07-26 (×3): qty 1

## 2018-07-26 MED ORDER — SODIUM CHLORIDE 0.9 % IV BOLUS
500.0000 mL | Freq: Once | INTRAVENOUS | Status: AC
  Administered 2018-07-26: 500 mL via INTRAVENOUS

## 2018-07-26 MED ORDER — ISOSORB DINITRATE-HYDRALAZINE 20-37.5 MG PO TABS
0.5000 | ORAL_TABLET | Freq: Two times a day (BID) | ORAL | Status: DC
  Administered 2018-07-26 – 2018-07-28 (×5): 0.5 via ORAL
  Filled 2018-07-26 (×3): qty 1
  Filled 2018-07-26: qty 0.5
  Filled 2018-07-26: qty 1

## 2018-07-26 MED ORDER — FAMOTIDINE 20 MG PO TABS
10.0000 mg | ORAL_TABLET | Freq: Two times a day (BID) | ORAL | Status: DC
  Administered 2018-07-26 – 2018-07-28 (×5): 10 mg via ORAL
  Filled 2018-07-26 (×5): qty 1

## 2018-07-26 MED ORDER — DM-GUAIFENESIN ER 30-600 MG PO TB12
1.0000 | ORAL_TABLET | Freq: Two times a day (BID) | ORAL | Status: DC
  Administered 2018-07-26 – 2018-07-28 (×5): 1 via ORAL
  Filled 2018-07-26 (×5): qty 1

## 2018-07-26 MED ORDER — ISOSORB DINITRATE-HYDRALAZINE 20-37.5 MG PO TABS
1.0000 | ORAL_TABLET | Freq: Every day | ORAL | Status: DC
  Administered 2018-07-26 – 2018-07-28 (×3): 1 via ORAL
  Filled 2018-07-26 (×4): qty 1

## 2018-07-26 MED ORDER — IPRATROPIUM BROMIDE 0.02 % IN SOLN
0.5000 mg | Freq: Once | RESPIRATORY_TRACT | Status: AC
  Administered 2018-07-26: 0.5 mg via RESPIRATORY_TRACT
  Filled 2018-07-26: qty 2.5

## 2018-07-26 MED ORDER — ONDANSETRON HCL 4 MG/2ML IJ SOLN: 4.0000 mg | Freq: Four times a day (QID) | INTRAMUSCULAR | Status: DC | PRN

## 2018-07-26 MED ORDER — MAGNESIUM SULFATE 2 GM/50ML IV SOLN
2.0000 g | Freq: Once | INTRAVENOUS | Status: AC
  Administered 2018-07-26: 2 g via INTRAVENOUS
  Filled 2018-07-26: qty 50

## 2018-07-26 MED ORDER — METHYLPREDNISOLONE SODIUM SUCC 125 MG IJ SOLR
125.0000 mg | Freq: Once | INTRAMUSCULAR | Status: AC
  Administered 2018-07-26: 125 mg via INTRAVENOUS
  Filled 2018-07-26: qty 2

## 2018-07-26 MED ORDER — IPRATROPIUM-ALBUTEROL 0.5-2.5 (3) MG/3ML IN SOLN
3.0000 mL | Freq: Four times a day (QID) | RESPIRATORY_TRACT | Status: DC
  Administered 2018-07-26 – 2018-07-28 (×7): 3 mL via RESPIRATORY_TRACT
  Filled 2018-07-26 (×6): qty 3

## 2018-07-26 MED ORDER — FLUTICASONE PROPIONATE 50 MCG/ACT NA SUSP
2.0000 | Freq: Every day | NASAL | Status: DC
  Administered 2018-07-26 – 2018-07-28 (×3): 2 via NASAL
  Filled 2018-07-26 (×2): qty 16

## 2018-07-26 NOTE — ED Notes (Addendum)
Attempted report, nurse is at lunch. Left phone number for call back

## 2018-07-26 NOTE — H&P (Addendum)
TRH H&P   Patient Demographics:    Joe Garcia, is a 67 y.o. male  MRN: [id number]   DOB - 29-Oct-1950  Admit Date - 07/26/2018  Outpatient Primary MD for the patient is [facility]  Referring MD: Dr. Christy Gentles  Outpatient Specialists: None  Patient coming from: Home  Chief Complaint  Patient presents with  . Shortness of Breath  . Wheezing  . Cough      HPI:    Joe Garcia  is a 67 y.o. male, with history of COPD, not on home O2, alcohol abuse, nonischemic cardiomyopathy with EF of 40-45%, hypertension, chronic kidney disease stage II and hyperlipidemia who presented to the ED with almost 2 weeks of cough wheezing and chest congestion.  He saw his PCP who gave him some pills and a shot for his coughing but that did not help his symptoms.  He was coughing increasingly with whitish and yellowish phlegm and started having several episodes of nausea and vomiting aggravated by cough.  He then went to the urgent care 6 days back where he was given IV Solu-Medrol, nebs which improved his symptoms and was discharged on Tessalon for cough. However he felt symptoms did not improve and continued to be wheezy, short of breath on minimal exertion, productive cough and several episodes of vomiting at home.  He also reported subjective fevers but no chills.  Denies any leg swellings.  He took his inhalers and nebulizers at home frequently without any relief.  Denies any sick contact or recent travel.  Denies any headache, blurred vision, chest pain, palpitations, abdominal pain, dysuria, diarrhea (reported having loose bowel movements few days back).  Denies any tingling or numbness of the extremities or weakness.  Reports having significant weight loss in the past 6-8 months but unable to specify.  Patient reports he quit smoking almost 7 years back and quit drinking  about few years ago.  In the ED vitals were stable.  Patient maintaining sats on room air.  Blood work showed acute on chronic kidney disease with BUN of 33, creatinine of 2. 05, baseline 1.3), elevated potassium of 5.4, WBC of 3.9  Patient given hour-long nebs with albuterol and Atrovent, IV Solu-Medrol 125 mg with minimal relief.  Chest x-ray negative for infiltrate and showed hyperinflated lungs only.  EKG showed normal sinus rhythm with PVCs and LVH. Hospitalist consulted for admission to medical floor for COPD with acute exacerbation and acute on chronic kidney disease.   Review of systems:    In addition to the HPI above,  No Fever-chills, No Headache, No changes with Vision or hearing, No problems swallowing food or Liquids, No Chest pain, cough+++, redness of breath and wheezing+++ No Abdominal pain, nausea and vomiting+++, Bowel movements are regular, No Blood in stool or Urine, No dysuria, No new skin rashes or bruises, No new joints  pains-aches,  No new weakness, tingling, numbness in any extremity, Weight loss+++ No polyuria, polydypsia or polyphagia, No significant Mental Stressors.     With Past History of the following :    Past Medical History:  Diagnosis Date  . Alcoholism ([place])   . Arthritis    hips, L shoulder  . Asthma   . CHF (congestive heart failure) ([place])   . Depression   . Family history of anesthesia complication   . Hyperlipidemia   . Hypertension       Past Surgical History:  Procedure Laterality Date  . RIGHT/LEFT HEART CATH AND CORONARY ANGIOGRAPHY N/A 06/12/2017   Procedure: RIGHT/LEFT HEART CATH AND CORONARY ANGIOGRAPHY;  Surgeon: Jolaine Artist, MD;  Location: [place] [facility];  Service: Cardiovascular;  Laterality: N/A;      Social History:     Social History   Tobacco Use  . Smoking status: Former Smoker    Packs/day: 0.10    Years: 45.00    Pack years: 4.50    Last attempt to quit: 2012    Years since quitting:  8.0  . Smokeless tobacco: Never Used  Substance Use Topics  . Alcohol use: No    Frequency: Never    Comment: Pt states no etoh since April 2014     Lives -home  Mobility -independent   Family History :     Family History  Problem Relation Age of Onset  . Heart disease Father   . Heart disease Daughter        "fluid around heart"       Home Medications:   Prior to Admission medications   Medication Sig Start Date End Date Taking? Authorizing Provider  acetaminophen (TYLENOL) 500 MG tablet Take 1,500 mg by mouth 3 (three) times daily as needed for headache (pain).    Yes [provider]  albuterol (PROVENTIL HFA;VENTOLIN HFA) 108 (90 Base) MCG/ACT inhaler Inhale 1-2 puffs into the lungs every 4 (four) hours as needed for wheezing or shortness of breath. 02/23/18  Yes Melynda Ripple, MD  albuterol (PROVENTIL) (2.5 MG/3ML) 0.083% nebulizer solution Take 3 mLs (2.5 mg total) by nebulization every 6 (six) hours as needed for wheezing or shortness of breath. 02/23/18  Yes Melynda Ripple, MD  amiodarone (PACERONE) 200 MG tablet Take 1 tablet (200 mg total) by mouth daily. 07/13/17  Yes Clegg, Amy D, NP  amLODipine (NORVASC) 10 MG tablet Take 1 tablet (10 mg total) by mouth daily. 07/14/18  Yes Bensimhon, Shaune Pascal, MD  carvedilol (COREG) 3.125 MG tablet TAKE 1 TABLET BY MOUTH TWICE DAILY WITH MEALS Patient taking differently: Take 3.125 mg by mouth 2 (two) times daily with a meal.  07/19/18  Yes Bensimhon, Shaune Pascal, MD  fluticasone (FLONASE) 50 MCG/ACT nasal spray Place 2 sprays into both nostrils daily. 02/23/18  Yes Melynda Ripple, MD  furosemide (LASIX) 20 MG tablet Take 1 tablet (20 mg total) by mouth daily as needed (weight gain). Weight gain 07/07/18  Yes Bensimhon, Shaune Pascal, MD  isosorbide-hydrALAZINE (BIDIL) 20-37.5 MG tablet Take 0.5-1 tablets by mouth See admin instructions. Take 1/2 tablet every morning, 1 tablet at noon and 1/2 tablet in the evening    Yes  [provider]  losartan (COZAAR) 50 MG tablet TAKE 1 TABLET BY MOUTH TWICE A DAY Patient taking differently: Take 50 mg by mouth 2 (two) times daily.  07/19/18  Yes Bensimhon, Shaune Pascal, MD  polyethylene glycol powder (GLYCOLAX/MIRALAX) powder Take 17 g by  mouth daily as needed for mild constipation.   Yes [provider]  spironolactone (ALDACTONE) 25 MG tablet Take 0.5 tablets (12.5 mg total) by mouth daily. 07/07/18 10/05/18 Yes Bensimhon, Shaune Pascal, MD  benzonatate (TESSALON) 100 MG capsule Take 1-2 capsules (100-200 mg total) by mouth 3 (three) times daily as needed for cough. Patient not taking: Reported on 07/26/2018 07/20/18   Robyn Haber, MD  Spacer/Aero-Holding Chambers (AEROCHAMBER PLUS) inhaler Use as instructed 02/23/18   Melynda Ripple, MD     Allergies:     Allergies  Allergen Reactions  . Lisinopril Swelling    Facial and tongue swelling 10/2012  . Penicillins Other (See Comments)    "Stomach pains" Has patient had a PCN reaction causing immediate rash, facial/tongue/throat swelling, SOB or lightheadedness with hypotension: Unk Has patient had a PCN reaction causing severe rash involving mucus membranes or skin necrosis: Unk Has patient had a PCN reaction that required hospitalization: Unk Has patient had a PCN reaction occurring within the last 10 years: No If all of the above answers are "NO", then may proceed with Cephalosporin use.      Physical Exam:   Vitals  Blood pressure 114/63, pulse 62, temperature 97.6 F (36.4 C), temperature source Oral, resp. rate 16, SpO2 98 %.   General: Elderly male lying in bed appears fatigued, not in acute distress HEENT: Temporal wasting, pupils reactive bilaterally, EOMI, no pallor, no icterus, moist mucosa, supple neck, no cervical lymphadenopathy. Chest: Scattered wheezing and rhonchi bilaterally, no crackles CVS: Normal S1-S2, no murmurs rub or gallop GI: Soft, nondistended, nontender, bowel  sounds present Musculoskeletal: Warm, no edema CNS: Alert and oriented, fine tremors (reports to be chronic)   Data Review:    CBC Recent Labs  Lab 07/26/18 0240  WBC 3.9*  HGB 14.0  HCT 43.5  PLT 196  MCV 81.9  MCH 26.4  MCHC 32.2  RDW 15.6*  LYMPHSABS 2.0  MONOABS 0.3  EOSABS 0.0  BASOSABS 0.0   ------------------------------------------------------------------------------------------------------------------  Chemistries  Recent Labs  Lab 07/26/18 0240  NA 137  K 5.4*  CL 106  CO2 19*  GLUCOSE 104*  BUN 33*  CREATININE 2.05*  CALCIUM 8.6*   ------------------------------------------------------------------------------------------------------------------ estimated creatinine clearance is 37.2 mL/min (A) (by C-G formula based on SCr of 2.05 mg/dL (H)). ------------------------------------------------------------------------------------------------------------------ No results for input(s): TSH, T4TOTAL, T3FREE, THYROIDAB in the last 72 hours.  Invalid input(s): FREET3  Coagulation profile No results for input(s): INR, PROTIME in the last 168 hours. ------------------------------------------------------------------------------------------------------------------- No results for input(s): DDIMER in the last 72 hours. -------------------------------------------------------------------------------------------------------------------  Cardiac Enzymes No results for input(s): CKMB, TROPONINI, MYOGLOBIN in the last 168 hours.  Invalid input(s): CK ------------------------------------------------------------------------------------------------------------------    Component Value Date/Time   BNP 236.6 (H) 07/14/2018 1322     ---------------------------------------------------------------------------------------------------------------  Urinalysis    Component Value Date/Time   COLORURINE STRAW (A) 08/30/2017 1729   APPEARANCEUR CLEAR 08/30/2017 1729    LABSPEC 1.008 08/30/2017 1729   PHURINE 6.0 08/30/2017 1729   GLUCOSEU NEGATIVE 08/30/2017 1729   HGBUR NEGATIVE 08/30/2017 1729   BILIRUBINUR NEGATIVE 08/30/2017 1729   KETONESUR NEGATIVE 08/30/2017 1729   PROTEINUR NEGATIVE 08/30/2017 1729   UROBILINOGEN 1.0 07/16/2013 1404   NITRITE NEGATIVE 08/30/2017 1729   LEUKOCYTESUR NEGATIVE 08/30/2017 1729    ----------------------------------------------------------------------------------------------------------------   Imaging Results:    Dg Chest 2 View  Result Date: 07/26/2018 CLINICAL DATA:  Productive cough and dyspnea. EXAM: CHEST - 2 VIEW COMPARISON:  07/20/2018 chest radiograph. FINDINGS: Stable cardiomediastinal silhouette  with normal heart size. No pneumothorax. No pleural effusion. Hyperinflated lungs. No pulmonary edema. No acute consolidative airspace disease. IMPRESSION: 1. No acute cardiopulmonary disease. 2. Hyperinflated lungs, suggesting COPD. Electronically Signed   By: Ilona Sorrel M.D.   On: 07/26/2018 01:00    My personal review of EKG: Normal sinus rhythm at 70 with PVCs and LVH.  Assessment & Plan:   Principal problem COPD with acute exacerbation ([place]) Ongoing for almost 2 weeks with recent urgent care visit without improvement despite treatment.  No clear cause of trigger.  Quit smoking almost 7 years back. Admit to MedSurg. IV Solu-Medrol 80 every 6 hours, scheduled DuoNeb every 4 hours, PRN albuterol neb.  Will add Mucinex twice daily for cough and doxycycline twice daily. Maintaining sats on room air.   Active Problems:   Acute renal failure superimposed on stage 2 chronic kidney disease (HCC) Prerenal due to poor p.o. intake and frequent nausea and vomiting.  Hold Lasix, ARB and Aldactone.  Avoid nephrotoxins.  Patient is hypovolemic.  Placed on IV normal saline.      Essential hypertension Blood pressure stable.  Will hold losartan and Aldactone given AKI.  Resume BiDil.  Nausea and vomiting Appears  this is mainly triggered with persistent cough.  PRN IV Zofran and clear liquid.  Denies acid reflux symptoms.  Will place on Pepcid twice daily.    Nonischemic cardiomyopathy (HCC) EF of 40-45% as per echo earlier this year.  Patient is hypovolemic.  Will hold his ARB and Aldactone due to AKI.  Also hold Lasix (uses as needed).  Continue Coreg and BiDil. Monitor I's/O and daily weight..     Malnutrition of moderate degree Reports significant weight loss and past 6-8 months.  Nutrition consult.  Recommend outpatient work-up.  Hyperkalemia Repeat labs this afternoon.  Hold ARB and Aldactone.  PACs on the monitor.  PVCs On amiodarone which is continued.     DVT Prophylaxis: Subcu heparin  AM Labs Ordered, also please review Full Orders  Family Communication: Admission, patients condition and plan of care including tests being ordered have been discussed with the patient at bedside  Code Status full code  Likely DC to home in 48 hours if improved  Condition: [place] II called: None  Admission status: Inpatient  Patient presenting with persistent COPD symptoms for past 2 weeks, worsened for past 3-4 days with persistent cough, nausea and vomiting, active wheezing unimproved despite treatment in the urgent care recently and home nebulizers.  Patient also has acute on chronic kidney disease stage II, hyperkalemia and hypovolemia. He would need to be monitored as inpatient for >2 midnights for IV steroids, frequent nebulizer treatment, IV fluids and monitor his renal function closely.  Time spent in minutes : 70   Davis Ambrosini M.D on 07/26/2018 at 8:35 AM  Between 7am to 7pm - Pager - [phone]. After 7pm go to [email or web address] - password [facility]  [facility] - Office  [phone]

## 2018-07-26 NOTE — ED Provider Notes (Signed)
[place] EMERGENCY DEPARTMENT Provider Note   CSN: [id number] Arrival date & time: 07/25/18  2349     History   Chief Complaint Chief Complaint  Patient presents with  . Shortness of Breath  . Wheezing  . Cough    HPI Joe Garcia is a 67 y.o. male who c/o 2-3 weeks of chest congestion, wheezing, paroxysms of intractable cough and post tussive vomiting. The patient states that he was seen at his pcp a few weeks ago and was " given some pills and a shot" for coughing that did not help. He has previous hospitalization for COPD. He denies smoking. No Fevers. HPI  Past Medical History:  Diagnosis Date  . Alcoholism ([place])   . Arthritis    hips, L shoulder  . Asthma   . CHF (congestive heart failure) ([place])   . Depression   . Family history of anesthesia complication   . Hyperlipidemia   . Hypertension     Patient Active Problem List   Diagnosis Date Noted  . Malnutrition of moderate degree 07/16/2017  . 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 Solitary kidney, congenital 06/23/2017  . Nonischemic cardiomyopathy ([place])   . Multifocal PVCs   . Chronic combined systolic (congestive) and diastolic (congestive) heart failure ([place]) 06/11/2017  . Influenza A 07/17/2013  . Influenza 07/16/2013  . Tachycardia 07/16/2013  . Alcohol abuse 09/25/2012  . Alcohol dependence ([place]) 09/22/2012  . Essential hypertension 03/22/2007  . DEGENERATIVE DISC DISEASE 03/22/2007  . 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 RIGHT/LEFT HEART CATH AND CORONARY ANGIOGRAPHY N/A 06/12/2017   Procedure: RIGHT/LEFT HEART CATH AND CORONARY ANGIOGRAPHY;  Surgeon: Jolaine Artist, MD;  Location: [place] [facility];  Service: Cardiovascular;  Laterality: N/A;        Home Medications    Prior to Admission medications   Medication Sig Start Date End Date Taking? Authorizing Provider  acetaminophen (TYLENOL) 500 MG  tablet Take 1,500 mg by mouth 3 (three) times daily as needed for headache (pain).    Yes [provider]  albuterol (PROVENTIL HFA;VENTOLIN HFA) 108 (90 Base) MCG/ACT inhaler Inhale 1-2 puffs into the lungs every 4 (four) hours as needed for wheezing or shortness of breath. 02/23/18  Yes Melynda Ripple, MD  albuterol (PROVENTIL) (2.5 MG/3ML) 0.083% nebulizer solution Take 3 mLs (2.5 mg total) by nebulization every 6 (six) hours as needed for wheezing or shortness of breath. 02/23/18  Yes Melynda Ripple, MD  amiodarone (PACERONE) 200 MG tablet Take 1 tablet (200 mg total) by mouth daily. 07/13/17  Yes Clegg, Amy D, NP  amLODipine (NORVASC) 10 MG tablet Take 1 tablet (10 mg total) by mouth daily. 07/14/18  Yes Bensimhon, Shaune Pascal, MD  carvedilol (COREG) 3.125 MG tablet TAKE 1 TABLET BY MOUTH TWICE DAILY WITH MEALS Patient taking differently: Take 3.125 mg by mouth 2 (two) times daily with a meal.  07/19/18  Yes Bensimhon, Shaune Pascal, MD  fluticasone (FLONASE) 50 MCG/ACT nasal spray Place 2 sprays into both nostrils daily. 02/23/18  Yes Melynda Ripple, MD  furosemide (LASIX) 20 MG tablet Take 1 tablet (20 mg total) by mouth daily as needed (weight gain). Weight gain 07/07/18  Yes Bensimhon, Shaune Pascal, MD  isosorbide-hydrALAZINE (BIDIL) 20-37.5 MG tablet Take 0.5-1 tablets by mouth See admin instructions. Take 1/2 tablet every morning, 1 tablet at noon and 1/2 tablet in the evening    Yes [provider]  losartan (COZAAR) 50 MG tablet TAKE 1 TABLET BY MOUTH TWICE A DAY Patient taking differently: Take 50 mg by mouth 2 (two) times daily.  07/19/18  Yes Bensimhon, Shaune Pascal, MD  polyethylene glycol powder (GLYCOLAX/MIRALAX) powder Take 17 g by mouth daily as needed for mild constipation.   Yes [provider]  spironolactone (ALDACTONE) 25 MG tablet Take 0.5 tablets (12.5 mg total) by mouth daily. 07/07/18 10/05/18 Yes Bensimhon, Shaune Pascal, MD  benzonatate (TESSALON) 100 MG  capsule Take 1-2 capsules (100-200 mg total) by mouth 3 (three) times daily as needed for cough. Patient not taking: Reported on 07/26/2018 07/20/18   Robyn Haber, MD  Spacer/Aero-Holding Chambers (AEROCHAMBER PLUS) inhaler Use as instructed 02/23/18   Melynda Ripple, MD    Family History Family History  Problem Relation Age of Onset  . Heart disease Father   . Heart disease Daughter        "fluid around heart"     Social History Social History   Tobacco Use  . Smoking status: Former Smoker    Packs/day: 0.10    Years: 45.00    Pack years: 4.50    Last attempt to quit: 2012    Years since quitting: 8.0  . Smokeless tobacco: Never Used  Substance Use Topics  . Alcohol use: No    Frequency: Never    Comment: Pt states no etoh since April 2014  . Drug use: No    Comment: Prior use of crack cocaine, quit 2012     Allergies   Lisinopril and Penicillins   Review of Systems Review of Systems Ten systems reviewed and are negative for acute change, except as noted in the HPI.    Physical Exam Updated Vital Signs BP (!) 141/77   Pulse 65   Temp 97.6 F (36.4 C) (Oral)   Resp 20   SpO2 97%   Physical Exam Vitals signs and nursing note reviewed.  Constitutional:      General: He is not in acute distress.    Appearance: He is well-developed. He is not diaphoretic.  HENT:     Head: Normocephalic and atraumatic.  Eyes:     General: No scleral icterus.    Conjunctiva/sclera: Conjunctivae normal.  Neck:     Musculoskeletal: Normal range of motion and neck supple.  Cardiovascular:     Rate and Rhythm: Normal rate and regular rhythm.     Heart sounds: Normal heart sounds.  Pulmonary:     Effort: No respiratory distress.     Breath sounds: Wheezing present.     Comments: Increased effort, poor air movement Abdominal:     Palpations: Abdomen is soft.     Tenderness: There is no abdominal tenderness.  Skin:    General: Skin is warm and dry.  Neurological:       Mental Status: He is alert.  Psychiatric:        Behavior: Behavior normal.      ED Treatments / Results  Labs (all labs ordered are listed, but only abnormal results are displayed) Labs Reviewed  BASIC METABOLIC PANEL - Abnormal; Notable for the following components:      Result Value   Potassium 5.4 (*)    CO2 19 (*)    Glucose, Bld 104 (*)    BUN 33 (*)    Creatinine, Ser 2.05 (*)    Calcium 8.6 (*)    GFR calc non Af Amer 33 (*)    GFR calc Af  Amer 38 (*)    All other components within normal limits  CBC WITH DIFFERENTIAL/PLATELET - Abnormal; Notable for the following components:   WBC 3.9 (*)    RDW 15.6 (*)    Neutro Abs 1.6 (*)    All other components within normal limits    EKG EKG Interpretation  Date/Time:  Sunday July 25 2018 23:57:13 EST Ventricular Rate:  70 PR Interval:  136 QRS Duration: 96 QT Interval:  440 QTC Calculation: 475 R Axis:   66 Text Interpretation:  Sinus rhythm with Premature atrial complexes with Abberant conduction Minimal voltage criteria for LVH, may be normal variant Borderline ECG Interpretation limited secondary to artifact Confirmed by Ripley Fraise [phone]) on 07/26/2018 2:18:08 AM   Radiology Dg Chest 2 View  Result Date: 07/26/2018 CLINICAL DATA:  Productive cough and dyspnea. EXAM: CHEST - 2 VIEW COMPARISON:  07/20/2018 chest radiograph. FINDINGS: Stable cardiomediastinal silhouette with normal heart size. No pneumothorax. No pleural effusion. Hyperinflated lungs. No pulmonary edema. No acute consolidative airspace disease. IMPRESSION: 1. No acute cardiopulmonary disease. 2. Hyperinflated lungs, suggesting COPD. Electronically Signed   By: Ilona Sorrel M.D.   On: 07/26/2018 01:00    Procedures .Critical Care Performed by: Margarita Mail, PA-C Authorized by: Margarita Mail, PA-C   Critical care provider statement:    Critical care time (minutes):  30   Critical care was necessary to treat or prevent imminent  or life-threatening deterioration of the following conditions: status asthmaticus.   Critical care was time spent personally by me on the following activities:  Discussions with consultants, evaluation of patient's response to treatment, examination of patient, ordering and performing treatments and interventions, ordering and review of laboratory studies, ordering and review of radiographic studies, pulse oximetry, re-evaluation of patient's condition, obtaining history from patient or surrogate and review of old charts   (including critical care time)  Medications Ordered in ED Medications  albuterol (PROVENTIL,VENTOLIN) solution continuous neb (has no administration in time range)  sodium chloride 0.9 % bolus 500 mL (has no administration in time range)  albuterol (PROVENTIL) (2.5 MG/3ML) 0.083% nebulizer solution 5 mg (5 mg Nebulization Given 07/26/18 0002)  albuterol (PROVENTIL,VENTOLIN) solution continuous neb (10 mg/hr Nebulization Given 07/26/18 0224)  methylPREDNISolone sodium succinate (SOLU-MEDROL) 125 mg/2 mL injection 125 mg (125 mg Intravenous Given 07/26/18 0236)  ipratropium (ATROVENT) nebulizer solution 0.5 mg (0.5 mg Nebulization Given 07/26/18 0224)  magnesium sulfate IVPB 2 g 50 mL (0 g Intravenous Stopped 07/26/18 0335)     Initial Impression / Assessment and Plan / ED Course  I have reviewed the triage vital signs and the nursing notes.  Pertinent labs & imaging results that were available during my care of the patient were reviewed by me and considered in my medical decision making (see chart for details).     Patient with SOB an wheezing consistent with COPD exacerbation. I personally reviewed the CXR which shows hy[perinflation. No signs of infiltrate or edema. Patient has had multiple breathing treatments and now starting his second hour long treatment with only mild improvement. He appears to have a mild AKI> Potassium is elevated, but I expect hemolysis. Patient  Has a slight leukocytosis.  Patient will  Need admission.    Final Clinical Impressions(s) / ED Diagnoses   Final diagnoses:  COPD exacerbation ([place])  Hyperkalemia  AKI (acute kidney injury) [facility])    ED Discharge Orders    None       Margarita Mail, PA-C 07/26/18 0410  Ripley Fraise, MD 07/26/18 [phone]

## 2018-07-26 NOTE — ED Notes (Signed)
Patient assisted to restroom in wheelchair - very weak upon standing - requires at least 1 person assist. Patient endorsing generalized weakness, no unilateral weakness.

## 2018-07-26 NOTE — ED Notes (Signed)
RT called for CAT

## 2018-07-26 NOTE — ED Provider Notes (Signed)
Patient seen/examined in the Emergency Department in conjunction with Midlevel Provider Kenton Kingfisher Patient reports cough/wheezing/sob Exam : awake/alert, wheezing bilaterally Plan: plan to treat COPD, likely admit     Ripley Fraise, MD 07/26/18 [phone]

## 2018-07-26 NOTE — ED Notes (Signed)
Meal delievered for PT. No further requests at this time.

## 2018-07-27 ENCOUNTER — Other Ambulatory Visit: Payer: Self-pay

## 2018-07-27 LAB — BASIC METABOLIC PANEL
Anion gap: 9 (ref 5–15)
BUN: 15 mg/dL (ref 8–23)
CHLORIDE: 109 mmol/L (ref 98–111)
CO2: 20 mmol/L — ABNORMAL LOW (ref 22–32)
Calcium: 8.2 mg/dL — ABNORMAL LOW (ref 8.9–10.3)
Creatinine, Ser: 1.39 mg/dL — ABNORMAL HIGH (ref 0.61–1.24)
GFR calc Af Amer: >60 mL/min (ref >60)
GFR calc non Af Amer: 53 mL/min — ABNORMAL LOW (ref >60)
Glucose, Bld: 132 mg/dL — ABNORMAL HIGH (ref 70–99)
Potassium: 4.3 mmol/L (ref 3.5–5.1)
Sodium: 138 mmol/L (ref 135–145)

## 2018-07-27 LAB — CBC
HEMATOCRIT: 35.7 % — AB (ref 39.0–52.0)
Hemoglobin: 11.9 g/dL — ABNORMAL LOW (ref 13.0–17.0)
MCH: 26.9 pg (ref 26.0–34.0)
MCHC: 33.3 g/dL (ref 30.0–36.0)
MCV: 80.8 fL (ref 80.0–100.0)
Platelets: 170 10*3/uL (ref 150–400)
RBC: 4.42 MIL/uL (ref 4.22–5.81)
RDW: 15.4 % (ref 11.5–15.5)
WBC: 1.7 10*3/uL — ABNORMAL LOW (ref 4.0–10.5)
nRBC: 0 % (ref 0.0–0.2)

## 2018-07-27 LAB — HIV ANTIBODY (ROUTINE TESTING W REFLEX): HIV Screen 4th Generation wRfx: NONREACTIVE

## 2018-07-27 MED ORDER — DOXYCYCLINE HYCLATE 100 MG PO TABS
100.0000 mg | ORAL_TABLET | ORAL | Status: DC
  Administered 2018-07-27 – 2018-07-28 (×2): 100 mg via ORAL
  Filled 2018-07-27 (×2): qty 1

## 2018-07-27 MED ORDER — ENSURE ENLIVE PO LIQD
237.0000 mL | Freq: Two times a day (BID) | ORAL | Status: DC
  Administered 2018-07-28: 237 mL via ORAL

## 2018-07-27 NOTE — Progress Notes (Signed)
PROGRESS NOTE                                                                                                                                                                                                             Patient Demographics:    Joe Garcia, is a 67 y.o. male, DOB - [birth year], DGL:[id number]  Admit date - 07/26/2018   Admitting Physician Louellen Molder, MD  Outpatient Primary MD for the patient is [facility]  LOS - 1  Outpatient Specialists: None  Chief Complaint  Patient presents with  . Shortness of Breath  . Wheezing  . 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  Was seen in the urgent care where he was given nebs and sent home on antitussives without much relief.  Patient continued to be wheezy without improvement on his home inhaler and nebs and also having frequent nausea with vomiting of food. In the ED patient found to be extensively wheezy with acute on chronic kidney disease, potassium of 5.4.  Symptoms minimally improved with hour-long nebulizer treatment and Solu-Medrol.  Admitted for acute COPD exacerbation.   Subjective:   He reports his breathing to be improving from yesterday still not at baseline (feels around 70%).  Still wheezy intermittently.  No further nausea or vomiting and wants to advance diet.   Assessment  & Plan :   Principal problem COPD with acute exacerbation ([place]) Ongoing for almost 2 weeks with recent urgent care visit without improvement despite treatment.  No clear cause of trigger.  Quit smoking almost 7 years back. Slowly improving on IV Solu-Medrol (reduced dose to 60 mg every 12 hours).  Continue scheduled DuoNeb and PRN albuterol neb.  Continue twice  daily Mucinex and empiric doxycycline.   Still wheezy and needs another day in the hospital for clinical improvement.   Active Problems:   Acute renal failure superimposed on stage 2 chronic kidney disease (HCC) Prerenal due to poor p.o. intake and frequent nausea and vomiting.    Held Lasix, ARB and Aldactone.    Renal function improving in a.m. labs.  Discontinue fluids.    Essential hypertension Blood  pressure stable.  Held losartan and Aldactone given AKI.    Continue BiDil.  Nausea and vomiting Appears this is mainly triggered with persistent cough.  Tolerating clear liquid and no further symptoms.  Will advance diet.    Nonischemic cardiomyopathy (HCC) EF of 40-45% as per echo earlier this year.    Given IV fluids for hypovolemia.  Held ARB, Aldactone and Lasix for AKI.  Will resume upon discharge.     Malnutrition of moderate degree Reports significant weight loss and past 6-8 months.  Nutrition consult.  Recommend outpatient work-up.  Hyperkalemia Normalized on repeat labs.  Possible hemolysis.  ARB and Aldactone on hold.  PVCs On amiodarone which is continued.  Leukopenia WBC 1.7.  Possibly due to acute illness.  Differential unremarkable.    Code Status : Full code  Family Communication  : None at bedside  Disposition Plan  : Home possibly tomorrow if continues to improve  Barriers For Discharge : Active symptoms  Consults  : None  Procedures  : None  DVT Prophylaxis  : Subcu heparin  Lab Results  Component Value Date   PLT 170 07/27/2018    Antibiotics  :    Anti-infectives (From admission, onward)   Start     Dose/Rate Route Frequency Ordered Stop   07/26/18 1000  doxycycline (VIBRA-TABS) tablet 100 mg     100 mg Oral Every 12 hours 07/26/18 0831          Objective:   Vitals:   07/27/18 0314 07/27/18 0745 07/27/18 0814 07/27/18 1034  BP:   (!) 143/69 (!) 143/69  Pulse:   (!) 59 (!) 59  Resp:   16 16  Temp:   (!) 97.5 F (36.4 C)  (!) 97.5 F (36.4 C)  TempSrc:   Oral Oral  SpO2: 98% 97% 98%   Weight:    73.2 kg  Height:    5\' 11"  (1.803 m)    Wt Readings from Last 3 Encounters:  07/27/18 73.2 kg  07/14/18 73.2 kg  07/06/18 72.1 kg     Intake/Output Summary (Last 24 hours) at 07/27/2018 1118 Last data filed at 07/27/2018 1000 Gross per 24 hour  Intake 2599.48 ml  Output 2600 ml  Net -0.52 ml   Physical exam Not in distress HEENT: Moist mucosa, supple neck Chest: Scattered wheezing bilaterally CVs: Normal S1-S2, no murmurs GI: Soft, nondistended, nontender Musculoskeletal: Warm, no edema       Data Review:    CBC Recent Labs  Lab 07/26/18 0240 07/26/18 0903 07/27/18 0305  WBC 3.9* 2.8* 1.7*  HGB 14.0 13.5 11.9*  HCT 43.5 41.7 35.7*  PLT 196 183 170  MCV 81.9 83.4 80.8  MCH 26.4 27.0 26.9  MCHC 32.2 32.4 33.3  RDW 15.6* 15.7* 15.4  LYMPHSABS 2.0  --   --   MONOABS 0.3  --   --   EOSABS 0.0  --   --   BASOSABS 0.0  --   --     Chemistries  Recent Labs  Lab 07/26/18 0240 07/26/18 0903 07/26/18 1600 07/27/18 0305  NA 137  --  133* 138  K 5.4*  --  4.2 4.3  CL 106  --  106 109  CO2 19*  --  15* 20*  GLUCOSE 104*  --  195* 132*  BUN 33*  --  23 15  CREATININE 2.05* 1.89* 1.71* 1.39*  CALCIUM 8.6*  --  8.1* 8.2*   ------------------------------------------------------------------------------------------------------------------ No results for input(s): CHOL,  HDL, LDLCALC, TRIG, CHOLHDL, LDLDIRECT in the last 72 hours.  No results found for: HGBA1C ------------------------------------------------------------------------------------------------------------------ No results for input(s): TSH, T4TOTAL, T3FREE, THYROIDAB in the last 72 hours.  Invalid input(s): FREET3 ------------------------------------------------------------------------------------------------------------------ No results for input(s): VITAMINB12, FOLATE, FERRITIN, TIBC, IRON, RETICCTPCT in the last 72  hours.  Coagulation profile No results for input(s): INR, PROTIME in the last 168 hours.  No results for input(s): DDIMER in the last 72 hours.  Cardiac Enzymes No results for input(s): CKMB, TROPONINI, MYOGLOBIN in the last 168 hours.  Invalid input(s): CK ------------------------------------------------------------------------------------------------------------------    Component Value Date/Time   BNP 236.6 (H) 07/14/2018 1322    Inpatient Medications  Scheduled Meds: . amiodarone  200 mg Oral Daily  . amLODipine  10 mg Oral Daily  . carvedilol  3.125 mg Oral BID WC  . dextromethorphan-guaiFENesin  1 tablet Oral BID  . doxycycline  100 mg Oral Q12H  . famotidine  10 mg Oral BID  . fluticasone  2 spray Each Nare Daily  . heparin injection (subcutaneous)  5,000 Units Subcutaneous Q8H  . ipratropium-albuterol  3 mL Nebulization Q6H  . isosorbide-hydrALAZINE  0.5 tablet Oral BID  . isosorbide-hydrALAZINE  1 tablet Oral Q1200  . methylPREDNISolone (SOLU-MEDROL) injection  80 mg Intravenous Q6H   Continuous Infusions: . sodium chloride 100 mL/hr at 07/27/18 1000   PRN Meds:.acetaminophen **OR** acetaminophen, albuterol, ondansetron **OR** ondansetron (ZOFRAN) IV  Micro Results No results found for this or any previous visit (from the past 240 hour(s)).  Radiology Reports Dg Chest 2 View  Result Date: 07/26/2018 CLINICAL DATA:  Productive cough and dyspnea. EXAM: CHEST - 2 VIEW COMPARISON:  07/20/2018 chest radiograph. FINDINGS: Stable cardiomediastinal silhouette with normal heart size. No pneumothorax. No pleural effusion. Hyperinflated lungs. No pulmonary edema. No acute consolidative airspace disease. IMPRESSION: 1. No acute cardiopulmonary disease. 2. Hyperinflated lungs, suggesting COPD. Electronically Signed   By: Ilona Sorrel M.D.   On: 07/26/2018 01:00   Dg Chest 2 View  Result Date: 07/20/2018 CLINICAL DATA:  Cough short of breath EXAM: CHEST - 2 VIEW  COMPARISON:  06/25/2018 FINDINGS: Marked COPD and hyperinflation with changes of emphysema. Negative for infiltrate effusion or mass. Negative for heart failure. Mild apical scarring bilaterally head IMPRESSION: COPD without acute radiographic abnormality. Electronically Signed   By: Franchot Gallo M.D.   On: 07/20/2018 15:57    Time Spent in minutes  25   Tondalaya Perren M.D on 07/27/2018 at 11:18 AM  Between 7am to 7pm - Pager - [phone]  After 7pm go to [email or web address] - password [facility]  [facility] -  Office  [phone]

## 2018-07-27 NOTE — Progress Notes (Addendum)
Initial Nutrition Assessment  DOCUMENTATION CODES:   Non-severe (moderate) malnutrition in context of chronic illness  INTERVENTION:    Ensure Enlive po BID, each supplement provides 350 kcal and 20 grams of protein  NUTRITION DIAGNOSIS:   Moderate Malnutrition related to chronic illness(CHF, COPD, CKD) as evidenced by moderate fat depletion, moderate muscle depletion  GOAL:   Patient will meet greater than or equal to 90% of their needs  MONITOR:   PO intake, Supplement acceptance, Labs, Skin, Weight trends  REASON FOR ASSESSMENT:   Consult Assessment of nutrition requirement/status  ASSESSMENT:   67 yo Male with COPD not on home O2, 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    RD briefly spoke with patient at bedside. He reports a good appetite; ate 100% of lunch. Pt typically consumes 2-3 meals per day.  Pt enjoys Ensure supplements; drinks at home. Per readings below, pt's weight has been trending up. Labs & medications reviewed. CO2 20 (L).  NUTRITION - FOCUSED PHYSICAL EXAM:    Most Recent Value  Orbital Region  No depletion  Upper Arm Region  Moderate depletion  Thoracic and Lumbar Region  Moderate depletion  Buccal Region  No depletion  Temple Region  Mild depletion  Clavicle Bone Region  Moderate depletion  Clavicle and Acromion Bone Region  Moderate depletion  Scapular Bone Region  Unable to assess  Dorsal Hand  Unable to assess  Patellar Region  Moderate depletion  Anterior Thigh Region  Moderate depletion  Posterior Calf Region  Moderate depletion  Edema (RD Assessment)  None     Diet Order:   Diet Order            Diet Heart Room service appropriate? Yes; Fluid consistency: Thin  Diet effective now             EDUCATION NEEDS:   No education needs have been identified at this time  Skin:  Skin Assessment: Reviewed RN  Assessment  Last BM:  12/28  Height:   Ht Readings from Last 1 Encounters:  07/27/18 5\' 11"  (1.803 m)   Weight:   Wt Readings from Last 1 Encounters:  07/27/18 73.2 kg   Wt Readings from Last 10 Encounters:  07/27/18 73.2 kg  07/14/18 73.2 kg  07/06/18 72.1 kg  06/25/18 70.8 kg  06/22/18 70.9 kg  06/16/18 70.8 kg  06/02/18 73.2 kg  05/17/18 71.4 kg  02/24/18 68.9 kg  12/05/17 66.7 kg   BMI:  Body mass index is 22.51 kg/m.  Estimated Nutritional Needs:   Kcal:  1800-2000  Protein:  90-105 gm  Fluid:  1.8-2.0 L  Arthur Holms, RD, LDN Pager #: [phone] After-Hours Pager #: [phone]

## 2018-07-27 NOTE — Plan of Care (Signed)

## 2018-07-28 DIAGNOSIS — I10 Essential (primary) hypertension: Secondary | ICD-10-CM

## 2018-07-28 DIAGNOSIS — I428 Other cardiomyopathies: Secondary | ICD-10-CM

## 2018-07-28 LAB — BASIC METABOLIC PANEL
ANION GAP: 9 (ref 5–15)
BUN: 20 mg/dL (ref 8–23)
CHLORIDE: 108 mmol/L (ref 98–111)
CO2: 20 mmol/L — ABNORMAL LOW (ref 22–32)
Calcium: 8.4 mg/dL — ABNORMAL LOW (ref 8.9–10.3)
Creatinine, Ser: 1.41 mg/dL — ABNORMAL HIGH (ref 0.61–1.24)
GFR calc Af Amer: >60 mL/min (ref >60)
GFR calc non Af Amer: 52 mL/min — ABNORMAL LOW (ref >60)
Glucose, Bld: 144 mg/dL — ABNORMAL HIGH (ref 70–99)
Potassium: 4.3 mmol/L (ref 3.5–5.1)
Sodium: 137 mmol/L (ref 135–145)

## 2018-07-28 LAB — CBC WITH DIFFERENTIAL/PLATELET
Abs Immature Granulocytes: 0.01 10*3/uL (ref 0.00–0.07)
Basophils Absolute: 0 10*3/uL (ref 0.0–0.1)
Basophils Relative: 0 %
Eosinophils Absolute: 0 10*3/uL (ref 0.0–0.5)
Eosinophils Relative: 0 %
HCT: 34.2 % — ABNORMAL LOW (ref 39.0–52.0)
HEMOGLOBIN: 11.5 g/dL — AB (ref 13.0–17.0)
Immature Granulocytes: 0 %
LYMPHS PCT: 13 %
Lymphs Abs: 0.5 10*3/uL — ABNORMAL LOW (ref 0.7–4.0)
MCH: 27.5 pg (ref 26.0–34.0)
MCHC: 33.6 g/dL (ref 30.0–36.0)
MCV: 81.8 fL (ref 80.0–100.0)
Monocytes Absolute: 0.1 10*3/uL (ref 0.1–1.0)
Monocytes Relative: 2 %
NEUTROS PCT: 85 %
Neutro Abs: 3.3 10*3/uL (ref 1.7–7.7)
Platelets: 187 10*3/uL (ref 150–400)
RBC: 4.18 MIL/uL — ABNORMAL LOW (ref 4.22–5.81)
RDW: 15.8 % — ABNORMAL HIGH (ref 11.5–15.5)
WBC: 3.9 10*3/uL — ABNORMAL LOW (ref 4.0–10.5)
nRBC: 0 % (ref 0.0–0.2)

## 2018-07-28 MED ORDER — DM-GUAIFENESIN ER 30-600 MG PO TB12: 1.0000 | ORAL_TABLET | Freq: Two times a day (BID) | ORAL | 0 refills | Status: DC

## 2018-07-28 MED ORDER — PREDNISONE 20 MG PO TABS: 20.0000 mg | ORAL_TABLET | Freq: Every day | ORAL | 0 refills | Status: DC

## 2018-07-28 MED ORDER — ENSURE ENLIVE PO LIQD: 237.0000 mL | Freq: Two times a day (BID) | ORAL | 12 refills | Status: DC

## 2018-07-28 MED ORDER — DOXYCYCLINE HYCLATE 100 MG PO TABS: 100.0000 mg | ORAL_TABLET | Freq: Two times a day (BID) | ORAL | 0 refills | Status: AC

## 2018-07-28 MED ORDER — ACETAMINOPHEN 500 MG PO TABS: 1000.0000 mg | ORAL_TABLET | Freq: Three times a day (TID) | ORAL | 0 refills | Status: DC | PRN

## 2018-07-28 MED ORDER — DOXYCYCLINE HYCLATE 100 MG PO TABS: 100.0000 mg | ORAL_TABLET | Freq: Two times a day (BID) | ORAL | 0 refills | Status: DC

## 2018-07-28 NOTE — Discharge Instructions (Signed)
COPD and Physical Activity Chronic obstructive pulmonary disease (COPD) is a long-term (chronic) condition that affects the lungs. COPD is a general term that can be used to describe many different lung problems that cause lung swelling (inflammation) and limit airflow, including chronic bronchitis and emphysema. The main symptom of COPD is shortness of breath, which makes it harder to do even simple tasks. This can also make it harder to exercise and be active. Talk with your health care provider about treatments to help you breathe better and actions you can take to prevent breathing problems during physical activity. What are the benefits of exercising with COPD? Exercising regularly is an important part of a healthy lifestyle. You can still exercise and do physical activities even though you have COPD. Exercise and physical activity improve your shortness of breath by increasing blood flow (circulation). This causes your heart to pump more oxygen through your body. Moderate exercise can improve your:  Oxygen use.  Energy level.  Shortness of breath.  Strength in your breathing muscles.  Heart health.  Sleep.  Self-esteem and feelings of self-worth.  Depression, stress, and anxiety levels. Exercise can benefit everyone with COPD. The severity of your disease may affect how hard you can exercise, especially at first, but everyone can benefit. Talk with your health care provider about how much exercise is safe for you, and which activities and exercises are safe for you. What actions can I take to prevent breathing problems during physical activity?  Sign up for a pulmonary rehabilitation program. This type of program may include: ? Education about lung diseases. ? Exercise classes that teach you how to exercise and be more active while improving your breathing. This usually involves:  Exercise using your lower extremities, such as a stationary bicycle.  About 30 minutes of  exercise, 2 to 5 times per week, for 6 to 12 weeks  Strength training, such as push ups or leg lifts. ? Nutrition education. ? Group classes in which you can talk with others who also have COPD and learn ways to manage stress.  If you use an oxygen tank, you should use it while you exercise. Work with your health care provider to adjust your oxygen for your physical activity. Your resting flow rate is different from your flow rate during physical activity.  While you are exercising: ? Take slow breaths. ? Pace yourself and do not try to go too fast. ? Purse your lips while breathing out. Pursing your lips is similar to a kissing or whistling position. ? If doing exercise that uses a quick burst of effort, such as weight lifting:  Breathe in before starting the exercise.  Breathe out during the hardest part of the exercise (such as raising the weights). Where to find support You can find support for exercising with COPD from:  Your health care provider.  A pulmonary rehabilitation program.  Your local health department or community health programs.  Support groups, online or in-person. Your health care provider may be able to recommend support groups. Where to find more information You can find more information about exercising with COPD from:  American Lung Association: ClassInsider.se.  COPD Foundation: https://www.rivera.net/. Contact a health care provider if:  Your symptoms get worse.  You have chest pain.  You have nausea.  You have a fever.  You have trouble talking or catching your breath.  You want to start a new exercise program or a new activity. Summary  COPD is a general term that  can be used to describe many different lung problems that cause lung swelling (inflammation) and limit airflow. This includes chronic bronchitis and emphysema.  Exercise and physical activity improve your shortness of breath by increasing blood flow (circulation). This causes your heart to  provide more oxygen to your body.  Contact your health care provider before starting any exercise program or new activity. Ask your health care provider what exercises and activities are safe for you. This information is not intended to replace advice given to you by your health care provider. Make sure you discuss any questions you have with your health care provider. Document Released: 08/06/2017 Document Revised: 08/06/2017 Document Reviewed: 08/06/2017 Elsevier Interactive Patient Education  2019 [employer]. Chronic Obstructive Pulmonary Disease Chronic obstructive pulmonary disease (COPD) is a long-term (chronic) lung problem. When you have COPD, it is hard for air to get in and out of your lungs. Usually the condition gets worse over time, and your lungs will never return to normal. There are things you can do to keep yourself as healthy as possible.  Your doctor may treat your condition with: ? Medicines. ? Oxygen. ? Lung surgery.  Your doctor may also recommend: ? Rehabilitation. This includes steps to make your body work better. It may involve a team of specialists. ? Quitting smoking, if you smoke. ? Exercise and changes to your diet. ? Comfort measures (palliative care). Follow these instructions at home: Medicines  Take over-the-counter and prescription medicines only as told by your doctor.  Talk to your doctor before taking any cough or allergy medicines. You may need to avoid medicines that cause your lungs to be dry. Lifestyle  If you smoke, stop. Smoking makes the problem worse. If you need help quitting, ask your doctor.  Avoid being around things that make your breathing worse. This may include smoke, chemicals, and fumes.  Stay active, but remember to rest as well.  Learn and use tips on how to relax.  Make sure you get enough sleep. Most adults need at least 7 hours of sleep every night.  Eat healthy foods. Eat smaller meals more often. Rest before  meals. Controlled breathing Learn and use tips on how to control your breathing as told by your doctor. Try:  Breathing in (inhaling) through your nose for 1 second. Then, pucker your lips and breath out (exhale) through your lips for 2 seconds.  Putting one hand on your belly (abdomen). Breathe in slowly through your nose for 1 second. Your hand on your belly should move out. Pucker your lips and breathe out slowly through your lips. Your hand on your belly should move in as you breathe out.  Controlled coughing Learn and use controlled coughing to clear mucus from your lungs. Follow these steps: 1. Lean your head a little forward. 2. Breathe in deeply. 3. Try to hold your breath for 3 seconds. 4. Keep your mouth slightly open while coughing 2 times. 5. Spit any mucus out into a tissue. 6. Rest and do the steps again 1 or 2 times as needed. General instructions  Make sure you get all the shots (vaccines) that your doctor recommends. Ask your doctor about a flu shot and a pneumonia shot.  Use oxygen therapy and pulmonary rehabilitation if told by your doctor. If you need home oxygen therapy, ask your doctor if you should buy a tool to measure your oxygen level (oximeter).  Make a COPD action plan with your doctor. This helps you to know what to  do if you feel worse than usual.  Manage any other conditions you have as told by your doctor.  Avoid going outside when it is very hot, cold, or humid.  Avoid people who have a sickness you can catch (contagious).  Keep all follow-up visits as told by your doctor. This is important. Contact a doctor if:  You cough up more mucus than usual.  There is a change in the color or thickness of the mucus.  It is harder to breathe than usual.  Your breathing is faster than usual.  You have trouble sleeping.  You need to use your medicines more often than usual.  You have trouble doing your normal activities such as getting dressed or  walking around the house. Get help right away if:  You have shortness of breath while resting.  You have shortness of breath that stops you from: ? Being able to talk. ? Doing normal activities.  Your chest hurts for longer than 5 minutes.  Your skin color is more blue than usual.  Your pulse oximeter shows that you have low oxygen for longer than 5 minutes.  You have a fever.  You feel too tired to breathe normally. Summary  Chronic obstructive pulmonary disease (COPD) is a long-term lung problem.  The way your lungs work will never return to normal. Usually the condition gets worse over time. There are things you can do to keep yourself as healthy as possible.  Take over-the-counter and prescription medicines only as told by your doctor.  If you smoke, stop. Smoking makes the problem worse. This information is not intended to replace advice given to you by your health care provider. Make sure you discuss any questions you have with your health care provider. Document Released: 12/31/2007 Document Revised: 08/18/2016 Document Reviewed: 08/18/2016 Elsevier Interactive Patient Education  2019 [employer].

## 2018-07-28 NOTE — Plan of Care (Signed)

## 2018-07-28 NOTE — Progress Notes (Signed)
Discharge instructions (including medications) discussed with and copy provided to patient/caregiver. Patient called family to arrange ride. Will continue to monitor.

## 2018-07-28 NOTE — Discharge Summary (Addendum)
Physician Discharge Summary  Joe Garcia DJM:[id number] DOB: 03-03-1951 DOA: 07/26/2018  PCP: Medicine, [facility]  Admit date: 07/26/2018 Discharge date: 07/28/2018  Admitted From: Home Disposition: Home  Recommendations for Outpatient Follow-up:  1. Follow up with PCP in 1-2 weeks 2. Patient will complete oral prednisone taper over the next 12 days.  Will complete 5-day course of antibiotic after 1/3.  Home Health: None Equipment/Devices: None  Discharge Condition: Fair CODE STATUS: Full code Diet recommendation: Heart healthy    Discharge Diagnoses:  Principal Problem:   COPD with acute exacerbation ([place])  Active Problems:   Acute renal failure superimposed on stage 2 chronic kidney disease (HCC)   Essential hypertension   Alcohol dependence (HCC)   Nonischemic cardiomyopathy/ chronic systolic chf (HCC)   Malnutrition of moderate degree   Nausea and vomiting   Hyperkalemia   Weight loss, unintentional   Brief narrative/HPI 68 year old male with COPD not on home O2, 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  Was seen in the urgent care where he was given nebs and sent home on antitussives without much relief.  Patient continued to be wheezy without improvement on his home inhaler and nebs and also having frequent nausea with vomiting of food. In the ED patient found to be extensively wheezy with acute on chronic kidney disease, potassium of 5.4.  Symptoms minimally improved with hour-long nebulizer treatment and Solu-Medrol.  Admitted for acute COPD exacerbation.  Principal problem COPD with acute exacerbation ([place]) Ongoing for almost 2 weeks with recent urgent care visit without improvement despite treatment. No clear cause of trigger. Quit smoking almost 7 years back. Placed on IV Solu-Medrol, scheduled DuoNeb and PRN albuterol  neb, twice daily Mucinex and empiric doxycycline.  Significant improvement in breathing is back to baseline today. I will discharge him on oral prednisone taper over the next 12 days, complete 5-day course of oral doxycycline.  Prescribed twice daily Mucinex.  Resume home inhaler and nebulizer. Instructed to call his PCP or return to the ED if he has further symptoms of wheezing and shortness of breath.  Active Problems: Acute renal failure superimposed on stage 2 chronic kidney disease (HCC) Prerenal due to poor p.o. intake and frequent nausea and vomiting.   Held Lasix, ARB and Aldactone.   Renal function returned to baseline.  Resume all meds.  Essential hypertension Blood pressure stable.  Held losartan and Aldactone given AKI and hyperkalemia.  Continued BiDil.  Resumed all meds upon discharge.  Nausea and vomiting Appears this is mainly triggered with persistent cough.    Now tolerating diet and no further symptoms.  Nonischemic cardiomyopathy (HCC) Chronic systolic chf with F of 22-29% as per echo earlier this year.   Given IV fluids for hypovolemia.  Held ARB, Aldactone and Lasix for AKI.  Resumed upon discharge.   Malnutrition of moderate degree Reports significant weight loss and past 6-8 months. Nutrition consulted and added supplement.  Recommend outpatient work-up.  Hyperkalemia Normalized on repeat labs.  Possible hemolysis.  ARB and Aldactone held while in the hospital and resume upon discharge.  PVCs On amiodarone which is continued.      Code Status : Full code  Family Communication  : None at bedside  Disposition Plan  : Home possibly tomorrow if continues to improve  Barriers For Discharge : Active symptoms  Consults  : None  Procedures  : None  Discharge Instructions   Allergies as of 07/28/2018      Reactions   Lisinopril Swelling   Facial and tongue swelling 10/2012   Penicillins Other (See Comments)   "Stomach  pains" Has patient had a PCN reaction causing immediate rash, facial/tongue/throat swelling, SOB or lightheadedness with hypotension: Unk Has patient had a PCN reaction causing severe rash involving mucus membranes or skin necrosis: Unk Has patient had a PCN reaction that required hospitalization: Unk Has patient had a PCN reaction occurring within the last 10 years: No If all of the above answers are "NO", then may proceed with Cephalosporin use.      Medication List    STOP taking these medications   benzonatate 100 MG capsule Commonly known as:  TESSALON     TAKE these medications   acetaminophen 500 MG tablet Commonly known as:  TYLENOL Take 2 tablets (1,000 mg total) by mouth 3 (three) times daily as needed for headache (pain). What changed:  how much to take   AEROCHAMBER PLUS inhaler Use as instructed   albuterol 108 (90 Base) MCG/ACT inhaler Commonly known as:  PROVENTIL HFA;VENTOLIN HFA Inhale 1-2 puffs into the lungs every 4 (four) hours as needed for wheezing or shortness of breath.   albuterol (2.5 MG/3ML) 0.083% nebulizer solution Commonly known as:  PROVENTIL Take 3 mLs (2.5 mg total) by nebulization every 6 (six) hours as needed for wheezing or shortness of breath.   amiodarone 200 MG tablet Commonly known as:  PACERONE Take 1 tablet (200 mg total) by mouth daily.   amLODipine 10 MG tablet Commonly known as:  NORVASC Take 1 tablet (10 mg total) by mouth daily.   carvedilol 3.125 MG tablet Commonly known as:  COREG TAKE 1 TABLET BY MOUTH TWICE DAILY WITH MEALS   dextromethorphan-guaiFENesin 30-600 MG 12hr tablet Commonly known as:  MUCINEX DM Take 1 tablet by mouth 2 (two) times daily.   doxycycline 100 MG tablet Commonly known as:  VIBRA-TABS Take 1 tablet (100 mg total) by mouth 2 (two) times daily for 3 days.   feeding supplement (ENSURE ENLIVE) Liqd Take 237 mLs by mouth 2 (two) times daily between meals.   fluticasone 50 MCG/ACT nasal  spray Commonly known as:  FLONASE Place 2 sprays into both nostrils daily.   furosemide 20 MG tablet Commonly known as:  LASIX Take 1 tablet (20 mg total) by mouth daily as needed (weight gain). Weight gain   isosorbide-hydrALAZINE 20-37.5 MG tablet Commonly known as:  BIDIL Take 0.5-1 tablets by mouth See admin instructions. Take 1/2 tablet every morning, 1 tablet at noon and 1/2 tablet in the evening   losartan 50 MG tablet Commonly known as:  COZAAR TAKE 1 TABLET BY MOUTH TWICE A DAY   polyethylene glycol powder powder Commonly known as:  GLYCOLAX/MIRALAX Take 17 g by mouth daily as needed for mild constipation.   predniSONE 20 MG tablet Commonly known as:  DELTASONE Take 1 tablet (20 mg total) by mouth daily with breakfast.   spironolactone 25 MG tablet Commonly known as:  ALDACTONE Take 0.5 tablets (12.5 mg total) by mouth daily.      Follow-up Information    [facility]icine, [facility]. Schedule an appointment as soon as possible for a visit in 1 week(s).   Specialty:  Family Medicine Contact information: [address] [place] [address] [phone]          Allergies  Allergen Reactions  . Lisinopril Swelling  Facial and tongue swelling 10/2012  . Penicillins Other (See Comments)    "Stomach pains" Has patient had a PCN reaction causing immediate rash, facial/tongue/throat swelling, SOB or lightheadedness with hypotension: Unk Has patient had a PCN reaction causing severe rash involving mucus membranes or skin necrosis: Unk Has patient had a PCN reaction that required hospitalization: Unk Has patient had a PCN reaction occurring within the last 10 years: No If all of the above answers are "NO", then may proceed with Cephalosporin use.      Procedures/Studies: Dg Chest 2 View  Result Date: 07/26/2018 CLINICAL DATA:  Productive cough and dyspnea. EXAM: CHEST - 2 VIEW COMPARISON:  07/20/2018 chest radiograph. FINDINGS: Stable  cardiomediastinal silhouette with normal heart size. No pneumothorax. No pleural effusion. Hyperinflated lungs. No pulmonary edema. No acute consolidative airspace disease. IMPRESSION: 1. No acute cardiopulmonary disease. 2. Hyperinflated lungs, suggesting COPD. Electronically Signed   By: Ilona Sorrel M.D.   On: 07/26/2018 01:00   Dg Chest 2 View  Result Date: 07/20/2018 CLINICAL DATA:  Cough short of breath EXAM: CHEST - 2 VIEW COMPARISON:  06/25/2018 FINDINGS: Marked COPD and hyperinflation with changes of emphysema. Negative for infiltrate effusion or mass. Negative for heart failure. Mild apical scarring bilaterally head IMPRESSION: COPD without acute radiographic abnormality. Electronically Signed   By: Franchot Gallo M.D.   On: 07/20/2018 15:57     Subjective: Feels much better and almost at baseline today.  No overnight events.  Discharge Exam: Vitals:   07/28/18 0730 07/28/18 0839  BP: (!) 143/71   Pulse: 60 61  Resp: 17 18  Temp: 97.8 F (36.6 C)   SpO2: 100% 100%   Vitals:   07/27/18 2352 07/28/18 0203 07/28/18 0730 07/28/18 0839  BP: 125/76  (!) 143/71   Pulse: 71  60 61  Resp: 15  17 18   Temp: 98.1 F (36.7 C)  97.8 F (36.6 C)   TempSrc: Oral  Oral   SpO2: 98% 96% 100% 100%  Weight:      Height:        General: Elderly male not in distress HEENT: Moist mucosa, supple neck Chest: Clear bilaterally CVS: Normal S1-S2, no murmurs GI: Soft, nondistended, nontender Musculoskeletal: Warm, no edema     The results of significant diagnostics from this hospitalization (including imaging, microbiology, ancillary and laboratory) are listed below for reference.     Microbiology: No results found for this or any previous visit (from the past 240 hour(s)).   Labs: BNP (last 3 results) Recent Labs    06/25/18 0323 07/06/18 1513 07/14/18 1322  BNP 194.6* 516.6* 268.3*   Basic Metabolic Panel: Recent Labs  Lab 07/26/18 0240 07/26/18 0903 07/26/18 1600  07/27/18 0305 07/28/18 0314  NA 137  --  133* 138 137  K 5.4*  --  4.2 4.3 4.3  CL 106  --  106 109 108  CO2 19*  --  15* 20* 20*  GLUCOSE 104*  --  195* 132* 144*  BUN 33*  --  23 15 20   CREATININE 2.05* 1.89* 1.71* 1.39* 1.41*  CALCIUM 8.6*  --  8.1* 8.2* 8.4*   Liver Function Tests: No results for input(s): AST, ALT, ALKPHOS, BILITOT, PROT, ALBUMIN in the last 168 hours. No results for input(s): LIPASE, AMYLASE in the last 168 hours. No results for input(s): AMMONIA in the last 168 hours. CBC: Recent Labs  Lab 07/26/18 0240 07/26/18 0903 07/27/18 0305 07/28/18 0314  WBC 3.9* 2.8* 1.7* 3.9*  NEUTROABS 1.6*  --   --  3.3  HGB 14.0 13.5 11.9* 11.5*  HCT 43.5 41.7 35.7* 34.2*  MCV 81.9 83.4 80.8 81.8  PLT 196 183 170 187   Cardiac Enzymes: No results for input(s): CKTOTAL, CKMB, CKMBINDEX, TROPONINI in the last 168 hours. BNP: Invalid input(s): POCBNP CBG: No results for input(s): GLUCAP in the last 168 hours. D-Dimer No results for input(s): DDIMER in the last 72 hours. Hgb A1c No results for input(s): HGBA1C in the last 72 hours. Lipid Profile No results for input(s): CHOL, HDL, LDLCALC, TRIG, CHOLHDL, LDLDIRECT in the last 72 hours. Thyroid function studies No results for input(s): TSH, T4TOTAL, T3FREE, THYROIDAB in the last 72 hours.  Invalid input(s): FREET3 Anemia work up No results for input(s): VITAMINB12, FOLATE, FERRITIN, TIBC, IRON, RETICCTPCT in the last 72 hours. Urinalysis    Component Value Date/Time   COLORURINE STRAW (A) 08/30/2017 1729   APPEARANCEUR CLEAR 08/30/2017 1729   LABSPEC 1.008 08/30/2017 1729   PHURINE 6.0 08/30/2017 1729   GLUCOSEU NEGATIVE 08/30/2017 1729   HGBUR NEGATIVE 08/30/2017 1729   BILIRUBINUR NEGATIVE 08/30/2017 1729   KETONESUR NEGATIVE 08/30/2017 1729   PROTEINUR NEGATIVE 08/30/2017 1729   UROBILINOGEN 1.0 07/16/2013 1404   NITRITE NEGATIVE 08/30/2017 1729   LEUKOCYTESUR NEGATIVE 08/30/2017 1729   Sepsis  Labs Invalid input(s): PROCALCITONIN,  WBC,  LACTICIDVEN Microbiology No results found for this or any previous visit (from the past 240 hour(s)).   Time coordinating discharge: >35 minutes  SIGNED:   Louellen Molder, MD  [facility] 07/28/2018, 10:59 AM Pager   If 7PM-7AM, please contact night-coverage [email or web address] Password [email or web address]

## 2018-08-17 ENCOUNTER — Ambulatory Visit (HOSPITAL_COMMUNITY)
Admission: RE | Admit: 2018-08-17 | Discharge: 2018-08-17 | Disposition: A | Discharge status: Home / Self Care | Payer: Medicare Other | Source: Ambulatory Visit | Attending: Cardiology | Admitting: Cardiology

## 2018-08-17 ENCOUNTER — Encounter (HOSPITAL_COMMUNITY): Payer: Self-pay

## 2018-08-17 VITALS — BP 150/92 | HR 56 | Wt 151.4 lb

## 2018-08-17 DIAGNOSIS — Z880 Allergy status to penicillin: Secondary | ICD-10-CM | POA: Diagnosis not present

## 2018-08-17 DIAGNOSIS — Q600 Renal agenesis, unilateral: Secondary | ICD-10-CM | POA: Insufficient documentation

## 2018-08-17 DIAGNOSIS — J449 Chronic obstructive pulmonary disease, unspecified: Secondary | ICD-10-CM | POA: Diagnosis not present

## 2018-08-17 DIAGNOSIS — Z87891 Personal history of nicotine dependence: Secondary | ICD-10-CM | POA: Diagnosis not present

## 2018-08-17 DIAGNOSIS — F1010 Alcohol abuse, uncomplicated: Secondary | ICD-10-CM

## 2018-08-17 DIAGNOSIS — I493 Ventricular premature depolarization: Secondary | ICD-10-CM | POA: Insufficient documentation

## 2018-08-17 DIAGNOSIS — J441 Chronic obstructive pulmonary disease with (acute) exacerbation: Secondary | ICD-10-CM | POA: Insufficient documentation

## 2018-08-17 DIAGNOSIS — Z79899 Other long term (current) drug therapy: Secondary | ICD-10-CM | POA: Diagnosis not present

## 2018-08-17 DIAGNOSIS — Z7952 Long term (current) use of systemic steroids: Secondary | ICD-10-CM | POA: Diagnosis not present

## 2018-08-17 DIAGNOSIS — I110 Hypertensive heart disease with heart failure: Secondary | ICD-10-CM | POA: Insufficient documentation

## 2018-08-17 DIAGNOSIS — I10 Essential (primary) hypertension: Secondary | ICD-10-CM | POA: Diagnosis not present

## 2018-08-17 DIAGNOSIS — I428 Other cardiomyopathies: Secondary | ICD-10-CM | POA: Insufficient documentation

## 2018-08-17 DIAGNOSIS — I5042 Chronic combined systolic (congestive) and diastolic (congestive) heart failure: Secondary | ICD-10-CM

## 2018-08-17 DIAGNOSIS — Z8249 Family history of ischemic heart disease and other diseases of the circulatory system: Secondary | ICD-10-CM | POA: Diagnosis not present

## 2018-08-17 DIAGNOSIS — N182 Chronic kidney disease, stage 2 (mild): Secondary | ICD-10-CM

## 2018-08-17 DIAGNOSIS — K219 Gastro-esophageal reflux disease without esophagitis: Secondary | ICD-10-CM | POA: Insufficient documentation

## 2018-08-17 DIAGNOSIS — Z888 Allergy status to other drugs, medicaments and biological substances status: Secondary | ICD-10-CM | POA: Insufficient documentation

## 2018-08-17 DIAGNOSIS — I5022 Chronic systolic (congestive) heart failure: Secondary | ICD-10-CM | POA: Diagnosis present

## 2018-08-17 DIAGNOSIS — Z550 Illiteracy and low-level literacy: Secondary | ICD-10-CM | POA: Diagnosis not present

## 2018-08-17 MED ORDER — AMIODARONE HCL 200 MG PO TABS: 200.0000 mg | ORAL_TABLET | Freq: Every day | ORAL | 3 refills | Status: DC

## 2018-08-17 MED ORDER — LOSARTAN POTASSIUM 25 MG PO TABS: 25.0000 mg | ORAL_TABLET | Freq: Two times a day (BID) | ORAL | 6 refills | Status: DC

## 2018-08-17 NOTE — Progress Notes (Addendum)
Advanced Heart Failure Clinic Note   Primary Cardiologist: Dr. Gwenlyn Found HF: Dr. [name]  PCP: None   HPI: Joe Garcia is a 68 y.o. 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 Former crack cocaine abuse.   Admitted 11/14 - 06/15/17 with CP and DOE. Received steroids and nebs. BNP minimally elevated on arrival. Troponin negative.  R/LHC below with normal cors and compensated hemodynamics. 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   Re-admitted 11/21-11/23/18 with cramping abdominal pain and GI symptoms. CT abdomen with no negative findings, treated as gastroenteritis. AKI noted with rise of creatinine from 1.0 -> 2.0 on admit. Improved to 1.7 at discharge. Pt states he never picked up [facility] after he left the hospital initially.   Seen in ED 09/09/17 with N/V and "chest pain". CP felt like his normal reflux. EKG with PVCs and NSR. Had been seen at home by paramedicine and told HR was in 30s, but normal in 60s on monitor. Likely undercounting due to PVCs.  Work up unremarkable.   He presents today for regular follow up. Admitted 12/30 - 07/28/2018 with AECOPD. He is doing well overall. He has been taking medications as directed, apart from losartan which he has been out for "some time". He is not SOB with ADLs at baseline, but is whenever he has fluid or "a cold". Denies significant lightheadedness or dizziness unless he has a COPD exacerbation. Weight stable.    He has ran out of losartan, not complete sure when. He denies peripheral edema or orthopnea at baseline.  ECHO 05/06/2017 EF 30-35% Grade IDD Echo 09/30/2017: EF 40-45%, grade 2 DD  Review of systems complete and found to be negative unless listed in HPI.    Past Medical History:  Diagnosis Date  . Alcoholism ([place])   . Arthritis    hips, L shoulder  . Asthma   . CHF (congestive heart failure) ([place])   . Depression   . Family history of anesthesia complication   . Hyperlipidemia     . Hypertension    Current Outpatient Medications  Medication Sig Dispense Refill  . acetaminophen (TYLENOL) 500 MG tablet Take 2 tablets (1,000 mg total) by mouth 3 (three) times daily as needed for headache (pain). 30 tablet 0  . albuterol (PROVENTIL HFA;VENTOLIN HFA) 108 (90 Base) MCG/ACT inhaler Inhale 1-2 puffs into the lungs every 4 (four) hours as needed for wheezing or shortness of breath. 1 Inhaler 0  . albuterol (PROVENTIL) (2.5 MG/3ML) 0.083% nebulizer solution Take 3 mLs (2.5 mg total) by nebulization every 6 (six) hours as needed for wheezing or shortness of breath. 75 mL 0  . amiodarone (PACERONE) 200 MG tablet Take 1 tablet (200 mg total) by mouth daily. 90 tablet 3  . amLODipine (NORVASC) 10 MG tablet Take 1 tablet (10 mg total) by mouth daily. 30 tablet 5  . carvedilol (COREG) 3.125 MG tablet TAKE 1 TABLET BY MOUTH TWICE DAILY WITH MEALS (Patient taking differently: Take 3.125 mg by mouth 2 (two) times daily with a meal. ) 60 tablet 5  . dextromethorphan-guaiFENesin (MUCINEX DM) 30-600 MG 12hr tablet Take 1 tablet by mouth 2 (two) times daily. 10 tablet 0  . feeding supplement, ENSURE ENLIVE, (ENSURE ENLIVE) LIQD Take 237 mLs by mouth 2 (two) times daily between meals. 237 mL 12  . fluticasone (FLONASE) 50 MCG/ACT nasal spray Place 2 sprays into both nostrils daily. 16 g 0  . furosemide (LASIX) 20 MG tablet  Take 1 tablet (20 mg total) by mouth daily as needed (weight gain). Weight gain 30 tablet 5  . isosorbide-hydrALAZINE (BIDIL) 20-37.5 MG tablet Take 0.5-1 tablets by mouth See admin instructions. Take 1/2 tablet every morning, 1 tablet at noon and 1/2 tablet in the evening     . losartan (COZAAR) 50 MG tablet TAKE 1 TABLET BY MOUTH TWICE A DAY (Patient taking differently: Take 50 mg by mouth 2 (two) times daily. ) 60 tablet 5  . polyethylene glycol powder (GLYCOLAX/MIRALAX) powder Take 17 g by mouth daily as needed for mild constipation.    . predniSONE (DELTASONE) 20 MG tablet  Take 1 tablet (20 mg total) by mouth daily with breakfast. 16 tablet 0  . Spacer/Aero-Holding Chambers (AEROCHAMBER PLUS) inhaler Use as instructed 1 each 2  . spironolactone (ALDACTONE) 25 MG tablet Take 0.5 tablets (12.5 mg total) by mouth daily. 15 tablet 3   No current facility-administered medications for this visit.    Allergies  Allergen Reactions  . Lisinopril Swelling    Facial and tongue swelling 10/2012  . Penicillins Other (See Comments)    "Stomach pains" Has patient had a PCN reaction causing immediate rash, facial/tongue/throat swelling, SOB or lightheadedness with hypotension: Unk Has patient had a PCN reaction causing severe rash involving mucus membranes or skin necrosis: Unk Has patient had a PCN reaction that required hospitalization: Unk Has patient had a PCN reaction occurring within the last 10 years: No If all of the above answers are "NO", then may proceed with Cephalosporin use.    Social History   Socioeconomic History  . Marital status: Single    Spouse name: Not on file  . Number of children: Not on file  . Years of education: Not on file  . Highest education level: Not on file  Occupational History  . Occupation: "it don't work for me", on disability  Social Needs  . Financial resource strain: Not on file  . Food insecurity:    Worry: Not on file    Inability: Not on file  . Transportation needs:    Medical: Not on file    Non-medical: Not on file  Tobacco Use  . Smoking status: Former Smoker    Packs/day: 0.10    Years: 45.00    Pack years: 4.50    Last attempt to quit: 2012    Years since quitting: 8.0  . Smokeless tobacco: Never Used  Substance and Sexual Activity  . Alcohol use: No    Frequency: Never    Comment: Pt states no etoh since April 2014  . Drug use: No    Comment: Prior use of crack cocaine, quit 2012  . Sexual activity: Never  Lifestyle  . Physical activity:    Days per week: Not on file    Minutes per session: Not on  file  . Stress: Not on file  Relationships  . Social connections:    Talks on phone: Not on file    Gets together: Not on file    Attends religious service: Not on file    Active member of club or organization: Not on file    Attends meetings of clubs or organizations: Not on file    Relationship status: Not on file  . Intimate partner violence:    Fear of current or ex partner: Not on file    Emotionally abused: Not on file    Physically abused: Not on file    Forced sexual activity: Not on  file  Other Topics Concern  . Not on file  Social History Narrative  . Not on file    Family History  Problem Relation Age of Onset  . Heart disease Father   . Heart disease Daughter        "fluid around heart"    Vitals:   08/17/18 0854  BP: (!) 150/92  Pulse: (!) 56  SpO2: 97%  Weight: 68.7 kg (151 lb 6.4 oz)   Wt Readings from Last 3 Encounters:  07/27/18 73.2 kg (161 lb 6.4 oz)  07/14/18 73.2 kg (161 lb 6.4 oz)  07/06/18 72.1 kg (159 lb)   PHYSICAL EXAM: General: Well appearing. No resp difficulty. HEENT: Normal x for poor dentition.  Neck: Supple. JVP 6-7 cm. Carotids 2+ bilat; no bruits. No thyromegaly or nodule noted. Cor: PMI nondisplaced. RRR, No M/G/R noted Lungs: Diminished throughout. No wheeze.  Abdomen: Soft, non-tender, non-distended, no HSM. No bruits or masses. +BS  Extremities: No cyanosis, clubbing, or rash. R and LLE no edema.  Neuro: Alert & orientedx3, cranial nerves grossly intact. moves all 4 extremities w/o difficulty. Affect pleasant   ASSESSMENT & PLAN: 1. Chronic Systolic Heart Failure. ECHO 05/06/2017 EF 30-35%. NICM. 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 Well compensated hemodynamics.  - NYHA II-III symptoms, confounded by lung disease.  - Volume status stable on exam.  - Continue lasix prn.  - Continue coreg 3.125 mg BID.  - Continue Bidil 1 tab TID - Resume losartan at 25 mg BID. BMET 2 weeks. He had angioedema on  Lisinopril, so is not candidate for Entresto.  - Reinforced fluid restriction to < 2 L daily, sodium restriction to less than 2000 mg daily, and the importance of daily weights.    2. PVCs - Stable. Stable. Continue amio to 200 mg daily. No change.  3. HTN - Uncontrolled.  - Goal SBP < 130. - Meds as above.  4. Solitary Kidney, Congenital - Follow renal function carefully with med adjustments.  - BMET today.  5. Illiteracy - He can not read pill bottles. Graduated paramedicine.  - Has gotten better at taking his pills. No change.  6. Asthma - per PCP.  - No wheeze on exam.  7. GERD - Continue protonix 40 mg daily. No change.  8. ED - Cannot do viagra with bidil - Per Urology.    Meds and labs as above. Resuming Losartan. Not candidate for Entresto with angioedema on ACE. RTC 3-4 months. Sooner with symptoms.   Shirley Friar, PA-C   Greater than 50% of the 25 minute visit was spent in counseling/coordination of care regarding disease state education, salt/fluid restriction, sliding scale diuretics, and medication compliance.

## 2018-08-17 NOTE — Patient Instructions (Signed)
REFILL Amiodarone   START Losartan 25mg  (1 tab) twice daily  Labs will need to be done in 2 weeks.  Follow up with the Advanced Practice Provider in 3-4 months

## 2018-08-20 ENCOUNTER — Institutional Professional Consult (permissible substitution): Payer: Self-pay | Admitting: Pulmonary Disease

## 2018-08-20 NOTE — Addendum Note (Signed)
Encounter addended by: Shirley Friar, PA-C on: 08/20/2018 8:16 AM  Actions taken: LOS modified

## 2018-08-20 NOTE — Progress Notes (Deleted)
Synopsis: Referred in January 2020 for COPD  Subjective:   PATIENT ID: Joe Garcia GENDER: male DOB: 11-15-1950, MRN: [id number]   HPI  No chief complaint on file.   ***  Past Medical History:  Diagnosis Date  . Alcoholism ([place])   . Arthritis    hips, L shoulder  . Asthma   . CHF (congestive heart failure) ([place])   . Depression   . Family history of anesthesia complication   . Hyperlipidemia   . Hypertension      Family History  Problem Relation Age of Onset  . Heart disease Father   . Heart disease Daughter        "fluid around heart"      Social History   Socioeconomic History  . Marital status: Single    Spouse name: Not on file  . Number of children: Not on file  . Years of education: Not on file  . Highest education level: Not on file  Occupational History  . Occupation: "it don't work for me", on disability  Social Needs  . Financial resource strain: Not on file  . Food insecurity:    Worry: Not on file    Inability: Not on file  . Transportation needs:    Medical: Not on file    Non-medical: Not on file  Tobacco Use  . Smoking status: Former Smoker    Packs/day: 0.10    Years: 45.00    Pack years: 4.50    Last attempt to quit: 2012    Years since quitting: 8.0  . Smokeless tobacco: Never Used  Substance and Sexual Activity  . Alcohol use: No    Frequency: Never    Comment: Pt states no etoh since April 2014  . Drug use: No    Comment: Prior use of crack cocaine, quit 2012  . Sexual activity: Never  Lifestyle  . Physical activity:    Days per week: Not on file    Minutes per session: Not on file  . Stress: Not on file  Relationships  . Social connections:    Talks on phone: Not on file    Gets together: Not on file    Attends religious service: Not on file    Active member of club or organization: Not on file    Attends meetings of clubs or organizations: Not on file    Relationship status: Not on file  . Intimate partner  violence:    Fear of current or ex partner: Not on file    Emotionally abused: Not on file    Physically abused: Not on file    Forced sexual activity: Not on file  Other Topics Concern  . Not on file  Social History Narrative  . Not on file     Allergies  Allergen Reactions  . Lisinopril Swelling    Facial and tongue swelling 10/2012  . Penicillins Other (See Comments)    "Stomach pains" Has patient had a PCN reaction causing immediate rash, facial/tongue/throat swelling, SOB or lightheadedness with hypotension: Unk Has patient had a PCN reaction causing severe rash involving mucus membranes or skin necrosis: Unk Has patient had a PCN reaction that required hospitalization: Unk Has patient had a PCN reaction occurring within the last 10 years: No If all of the above answers are "NO", then may proceed with Cephalosporin use.      Outpatient Medications Prior to Visit  Medication Sig Dispense Refill  . acetaminophen (TYLENOL) 500 MG tablet  Take 2 tablets (1,000 mg total) by mouth 3 (three) times daily as needed for headache (pain). 30 tablet 0  . albuterol (PROVENTIL HFA;VENTOLIN HFA) 108 (90 Base) MCG/ACT inhaler Inhale 1-2 puffs into the lungs every 4 (four) hours as needed for wheezing or shortness of breath. 1 Inhaler 0  . albuterol (PROVENTIL) (2.5 MG/3ML) 0.083% nebulizer solution Take 3 mLs (2.5 mg total) by nebulization every 6 (six) hours as needed for wheezing or shortness of breath. 75 mL 0  . amiodarone (PACERONE) 200 MG tablet Take 1 tablet (200 mg total) by mouth daily. 90 tablet 3  . amLODipine (NORVASC) 10 MG tablet Take 1 tablet (10 mg total) by mouth daily. (Patient taking differently: Take 10 mg by mouth daily. ) 30 tablet 5  . carvedilol (COREG) 3.125 MG tablet TAKE 1 TABLET BY MOUTH TWICE DAILY WITH MEALS (Patient taking differently: Take 3.125 mg by mouth 2 (two) times daily with a meal. ) 60 tablet 5  . dextromethorphan-guaiFENesin (MUCINEX DM) 30-600 MG 12hr  tablet Take 1 tablet by mouth 2 (two) times daily. 10 tablet 0  . feeding supplement, ENSURE ENLIVE, (ENSURE ENLIVE) LIQD Take 237 mLs by mouth 2 (two) times daily between meals. 237 mL 12  . fluticasone (FLONASE) 50 MCG/ACT nasal spray Place 2 sprays into both nostrils daily. 16 g 0  . furosemide (LASIX) 20 MG tablet Take 1 tablet (20 mg total) by mouth daily as needed (weight gain). Weight gain (Patient not taking: Reported on 08/17/2018) 30 tablet 5  . isosorbide-hydrALAZINE (BIDIL) 20-37.5 MG tablet Take 0.5-1 tablets by mouth See admin instructions. Take 1/2 tablet every morning, 1 tablet at noon and 1/2 tablet in the evening     . losartan (COZAAR) 25 MG tablet Take 1 tablet (25 mg total) by mouth 2 (two) times daily. 60 tablet 6  . polyethylene glycol powder (GLYCOLAX/MIRALAX) powder Take 17 g by mouth daily as needed for mild constipation.    . predniSONE (DELTASONE) 20 MG tablet Take 1 tablet (20 mg total) by mouth daily with breakfast. (Patient not taking: Reported on 08/17/2018) 16 tablet 0  . Spacer/Aero-Holding Chambers (AEROCHAMBER PLUS) inhaler Use as instructed 1 each 2  . spironolactone (ALDACTONE) 25 MG tablet Take 0.5 tablets (12.5 mg total) by mouth daily. 15 tablet 3   No facility-administered medications prior to visit.     ROS    Objective:  Physical Exam   There were no vitals filed for this visit.  ***  CBC    Component Value Date/Time   WBC 3.9 (L) 07/28/2018 0314   RBC 4.18 (L) 07/28/2018 0314   HGB 11.5 (L) 07/28/2018 0314   HCT 34.2 (L) 07/28/2018 0314   PLT 187 07/28/2018 0314   MCV 81.8 07/28/2018 0314   MCH 27.5 07/28/2018 0314   MCHC 33.6 07/28/2018 0314   RDW 15.8 (H) 07/28/2018 0314   LYMPHSABS 0.5 (L) 07/28/2018 0314   MONOABS 0.1 07/28/2018 0314   EOSABS 0.0 07/28/2018 0314   BASOSABS 0.0 07/28/2018 0314     Chest imaging:  PFT:  Labs:  Path:  Echo:  Heart Catheterization:   Records from his hospital discharge last month  reviewed where he had a COPD exaccerbation treated with solumedrol IV and bronchodilators.     Assessment & Plan:   No diagnosis found.  Discussion: ***    Current Outpatient Medications:  .  acetaminophen (TYLENOL) 500 MG tablet, Take 2 tablets (1,000 mg total) by mouth 3 (three) times daily  as needed for headache (pain)., Disp: 30 tablet, Rfl: 0 .  albuterol (PROVENTIL HFA;VENTOLIN HFA) 108 (90 Base) MCG/ACT inhaler, Inhale 1-2 puffs into the lungs every 4 (four) hours as needed for wheezing or shortness of breath., Disp: 1 Inhaler, Rfl: 0 .  albuterol (PROVENTIL) (2.5 MG/3ML) 0.083% nebulizer solution, Take 3 mLs (2.5 mg total) by nebulization every 6 (six) hours as needed for wheezing or shortness of breath., Disp: 75 mL, Rfl: 0 .  amiodarone (PACERONE) 200 MG tablet, Take 1 tablet (200 mg total) by mouth daily., Disp: 90 tablet, Rfl: 3 .  amLODipine (NORVASC) 10 MG tablet, Take 1 tablet (10 mg total) by mouth daily. (Patient taking differently: Take 10 mg by mouth daily. ), Disp: 30 tablet, Rfl: 5 .  carvedilol (COREG) 3.125 MG tablet, TAKE 1 TABLET BY MOUTH TWICE DAILY WITH MEALS (Patient taking differently: Take 3.125 mg by mouth 2 (two) times daily with a meal. ), Disp: 60 tablet, Rfl: 5 .  dextromethorphan-guaiFENesin (MUCINEX DM) 30-600 MG 12hr tablet, Take 1 tablet by mouth 2 (two) times daily., Disp: 10 tablet, Rfl: 0 .  feeding supplement, ENSURE ENLIVE, (ENSURE ENLIVE) LIQD, Take 237 mLs by mouth 2 (two) times daily between meals., Disp: 237 mL, Rfl: 12 .  fluticasone (FLONASE) 50 MCG/ACT nasal spray, Place 2 sprays into both nostrils daily., Disp: 16 g, Rfl: 0 .  furosemide (LASIX) 20 MG tablet, Take 1 tablet (20 mg total) by mouth daily as needed (weight gain). Weight gain (Patient not taking: Reported on 08/17/2018), Disp: 30 tablet, Rfl: 5 .  isosorbide-hydrALAZINE (BIDIL) 20-37.5 MG tablet, Take 0.5-1 tablets by mouth See admin instructions. Take 1/2 tablet every morning, 1  tablet at noon and 1/2 tablet in the evening , Disp: , Rfl:  .  losartan (COZAAR) 25 MG tablet, Take 1 tablet (25 mg total) by mouth 2 (two) times daily., Disp: 60 tablet, Rfl: 6 .  polyethylene glycol powder (GLYCOLAX/MIRALAX) powder, Take 17 g by mouth daily as needed for mild constipation., Disp: , Rfl:  .  predniSONE (DELTASONE) 20 MG tablet, Take 1 tablet (20 mg total) by mouth daily with breakfast. (Patient not taking: Reported on 08/17/2018), Disp: 16 tablet, Rfl: 0 .  Spacer/Aero-Holding Chambers (AEROCHAMBER PLUS) inhaler, Use as instructed, Disp: 1 each, Rfl: 2 .  spironolactone (ALDACTONE) 25 MG tablet, Take 0.5 tablets (12.5 mg total) by mouth daily., Disp: 15 tablet, Rfl: 3

## 2018-08-31 ENCOUNTER — Ambulatory Visit (HOSPITAL_COMMUNITY)
Admission: RE | Admit: 2018-08-31 | Discharge: 2018-08-31 | Disposition: A | Discharge status: Home / Self Care | Payer: Medicare Other | Source: Ambulatory Visit | Attending: Internal Medicine | Admitting: Internal Medicine

## 2018-08-31 DIAGNOSIS — I5042 Chronic combined systolic (congestive) and diastolic (congestive) heart failure: Secondary | ICD-10-CM | POA: Diagnosis not present

## 2018-08-31 LAB — BASIC METABOLIC PANEL
ANION GAP: 11 (ref 5–15)
BUN: 22 mg/dL (ref 8–23)
CO2: 21 mmol/L — ABNORMAL LOW (ref 22–32)
Calcium: 8.9 mg/dL (ref 8.9–10.3)
Chloride: 108 mmol/L (ref 98–111)
Creatinine, Ser: 1.8 mg/dL — ABNORMAL HIGH (ref 0.61–1.24)
GFR calc non Af Amer: 39 mL/min — ABNORMAL LOW (ref >60)
GFR, EST AFRICAN AMERICAN: 45 mL/min — AB (ref >60)
Glucose, Bld: 88 mg/dL (ref 70–99)
Potassium: 4.3 mmol/L (ref 3.5–5.1)
Sodium: 140 mmol/L (ref 135–145)

## 2018-09-14 ENCOUNTER — Telehealth (HOSPITAL_COMMUNITY): Payer: Self-pay | Admitting: Cardiology

## 2018-09-14 DIAGNOSIS — I5042 Chronic combined systolic (congestive) and diastolic (congestive) heart failure: Secondary | ICD-10-CM

## 2018-09-14 MED ORDER — LOSARTAN POTASSIUM 25 MG PO TABS: 25.0000 mg | ORAL_TABLET | Freq: Every day | ORAL | 6 refills | Status: DC

## 2018-09-14 NOTE — Telephone Encounter (Signed)
-----   Message from Shirley Friar, PA-C sent at 08/31/2018 10:39 AM EST ----- Decrease Losartan to 25 mg daily (down from BID). Repeat 2 weeks.     Legrand Como [address]" [place], PA-C 08/31/2018 10:39 AM

## 2018-09-14 NOTE — Telephone Encounter (Signed)
Notes recorded by Kerry Dory, CMA on 09/14/2018 at 2:54 PM EST Pt aware however patient unable to Identify correct medication bottle, will swing by office for further assistance  Repeat labs 3/3 @ 10 ------  Notes recorded by Kerry Dory, CMA on 09/13/2018 at 3:04 PM EST Left message for patient to call back.  [phone] (M) ------  Notes recorded by Kerry Dory, CMA on 09/06/2018 at 9:44 AM EST Left message for patient to call [phone] (M)  ------  Notes recorded by Kerry Dory, CMA on 08/31/2018 at 3:25 PM EST Unable to reach patient. No answer unable to leave message   ------  Notes recorded by Shirley Friar, PA-C on 08/31/2018 at 10:39 AM EST Decrease Losartan to 25 mg daily (down from BID). Repeat 2 weeks.     Legrand Como [address] [place], PA-C 08/31/2018 10:39 AM

## 2018-09-28 ENCOUNTER — Ambulatory Visit (HOSPITAL_COMMUNITY)
Admission: RE | Admit: 2018-09-28 | Discharge: 2018-09-28 | Disposition: A | Discharge status: Home / Self Care | Payer: Medicare Other | Source: Ambulatory Visit | Attending: Cardiology | Admitting: Cardiology

## 2018-09-28 DIAGNOSIS — I5042 Chronic combined systolic (congestive) and diastolic (congestive) heart failure: Secondary | ICD-10-CM | POA: Diagnosis present

## 2018-09-28 LAB — BASIC METABOLIC PANEL
Anion gap: 8 (ref 5–15)
BUN: 16 mg/dL (ref 8–23)
CO2: 22 mmol/L (ref 22–32)
Calcium: 8.9 mg/dL (ref 8.9–10.3)
Chloride: 108 mmol/L (ref 98–111)
Creatinine, Ser: 1.51 mg/dL — ABNORMAL HIGH (ref 0.61–1.24)
GFR calc non Af Amer: 48 mL/min — ABNORMAL LOW (ref >60)
GFR, EST AFRICAN AMERICAN: 55 mL/min — AB (ref >60)
Glucose, Bld: 89 mg/dL (ref 70–99)
Potassium: 4 mmol/L (ref 3.5–5.1)
Sodium: 138 mmol/L (ref 135–145)

## 2018-10-03 ENCOUNTER — Observation Stay (HOSPITAL_COMMUNITY): Payer: Medicare Other

## 2018-10-03 ENCOUNTER — Other Ambulatory Visit: Payer: Self-pay

## 2018-10-03 ENCOUNTER — Inpatient Hospital Stay (HOSPITAL_COMMUNITY)
Admission: EM | Admit: 2018-10-03 | Discharge: 2018-10-05 | DRG: 281 | Disposition: A | Discharge status: Home / Self Care | Payer: Medicare Other | Attending: Student in an Organized Health Care Education/Training Program | Admitting: Student in an Organized Health Care Education/Training Program

## 2018-10-03 ENCOUNTER — Encounter (HOSPITAL_COMMUNITY): Payer: Self-pay | Admitting: Emergency Medicine

## 2018-10-03 ENCOUNTER — Emergency Department (HOSPITAL_COMMUNITY): Payer: Medicare Other

## 2018-10-03 DIAGNOSIS — Z79899 Other long term (current) drug therapy: Secondary | ICD-10-CM

## 2018-10-03 DIAGNOSIS — Z9861 Coronary angioplasty status: Secondary | ICD-10-CM

## 2018-10-03 DIAGNOSIS — N182 Chronic kidney disease, stage 2 (mild): Secondary | ICD-10-CM | POA: Diagnosis present

## 2018-10-03 DIAGNOSIS — E785 Hyperlipidemia, unspecified: Secondary | ICD-10-CM

## 2018-10-03 DIAGNOSIS — I5042 Chronic combined systolic (congestive) and diastolic (congestive) heart failure: Secondary | ICD-10-CM | POA: Diagnosis present

## 2018-10-03 DIAGNOSIS — I493 Ventricular premature depolarization: Secondary | ICD-10-CM

## 2018-10-03 DIAGNOSIS — I5022 Chronic systolic (congestive) heart failure: Secondary | ICD-10-CM

## 2018-10-03 DIAGNOSIS — I130 Hypertensive heart and chronic kidney disease with heart failure and stage 1 through stage 4 chronic kidney disease, or unspecified chronic kidney disease: Secondary | ICD-10-CM | POA: Diagnosis not present

## 2018-10-03 DIAGNOSIS — I214 Non-ST elevation (NSTEMI) myocardial infarction: Secondary | ICD-10-CM | POA: Diagnosis not present

## 2018-10-03 DIAGNOSIS — I200 Unstable angina: Secondary | ICD-10-CM | POA: Diagnosis present

## 2018-10-03 DIAGNOSIS — I5181 Takotsubo syndrome: Secondary | ICD-10-CM | POA: Diagnosis present

## 2018-10-03 DIAGNOSIS — R079 Chest pain, unspecified: Secondary | ICD-10-CM | POA: Diagnosis not present

## 2018-10-03 DIAGNOSIS — K2970 Gastritis, unspecified, without bleeding: Secondary | ICD-10-CM

## 2018-10-03 DIAGNOSIS — R778 Other specified abnormalities of plasma proteins: Secondary | ICD-10-CM

## 2018-10-03 DIAGNOSIS — Z8249 Family history of ischemic heart disease and other diseases of the circulatory system: Secondary | ICD-10-CM

## 2018-10-03 DIAGNOSIS — R7989 Other specified abnormal findings of blood chemistry: Secondary | ICD-10-CM | POA: Diagnosis not present

## 2018-10-03 DIAGNOSIS — Z888 Allergy status to other drugs, medicaments and biological substances status: Secondary | ICD-10-CM

## 2018-10-03 DIAGNOSIS — R008 Other abnormalities of heart beat: Secondary | ICD-10-CM

## 2018-10-03 DIAGNOSIS — Z880 Allergy status to penicillin: Secondary | ICD-10-CM

## 2018-10-03 DIAGNOSIS — F1020 Alcohol dependence, uncomplicated: Secondary | ICD-10-CM | POA: Diagnosis present

## 2018-10-03 DIAGNOSIS — Z87891 Personal history of nicotine dependence: Secondary | ICD-10-CM

## 2018-10-03 LAB — TROPONIN I
Troponin I: <0.03 ng/mL (ref <0.03)
Troponin I: 0.39 ng/mL (ref <0.03)

## 2018-10-03 LAB — BRAIN NATRIURETIC PEPTIDE: B Natriuretic Peptide: 106.5 pg/mL — ABNORMAL HIGH (ref 0.0–100.0)

## 2018-10-03 LAB — D-DIMER, QUANTITATIVE: D-Dimer, Quant: 0.41 ug/mL-FEU (ref 0.00–0.50)

## 2018-10-03 LAB — BASIC METABOLIC PANEL
Anion gap: 12 (ref 5–15)
BUN: 13 mg/dL (ref 8–23)
CO2: 21 mmol/L — ABNORMAL LOW (ref 22–32)
Calcium: 9.5 mg/dL (ref 8.9–10.3)
Chloride: 107 mmol/L (ref 98–111)
Creatinine, Ser: 1.65 mg/dL — ABNORMAL HIGH (ref 0.61–1.24)
GFR calc Af Amer: 50 mL/min — ABNORMAL LOW (ref >60)
GFR calc non Af Amer: 43 mL/min — ABNORMAL LOW (ref >60)
Glucose, Bld: 112 mg/dL — ABNORMAL HIGH (ref 70–99)
Potassium: 4.1 mmol/L (ref 3.5–5.1)
Sodium: 140 mmol/L (ref 135–145)

## 2018-10-03 LAB — CBC
HCT: 42.9 % (ref 39.0–52.0)
Hemoglobin: 13.6 g/dL (ref 13.0–17.0)
MCH: 27.1 pg (ref 26.0–34.0)
MCHC: 31.7 g/dL (ref 30.0–36.0)
MCV: 85.6 fL (ref 80.0–100.0)
Platelets: 311 10*3/uL (ref 150–400)
RBC: 5.01 MIL/uL (ref 4.22–5.81)
RDW: 15.5 % (ref 11.5–15.5)
WBC: 8.5 10*3/uL (ref 4.0–10.5)
nRBC: 0 % (ref 0.0–0.2)

## 2018-10-03 LAB — RAPID URINE DRUG SCREEN, HOSP PERFORMED
Amphetamines: NOT DETECTED
Barbiturates: NOT DETECTED
Benzodiazepines: NOT DETECTED
Cocaine: NOT DETECTED
Opiates: POSITIVE — AB
Tetrahydrocannabinol: NOT DETECTED

## 2018-10-03 LAB — I-STAT TROPONIN, ED: Troponin i, poc: 0.16 ng/mL (ref 0.00–0.08)

## 2018-10-03 LAB — MRSA PCR SCREENING: MRSA by PCR: NEGATIVE

## 2018-10-03 MED ORDER — NITROGLYCERIN IN D5W 200-5 MCG/ML-% IV SOLN
0.0000 ug/min | INTRAVENOUS | Status: DC
  Administered 2018-10-03: 5 ug/min via INTRAVENOUS
  Filled 2018-10-03: qty 250

## 2018-10-03 MED ORDER — HEPARIN BOLUS VIA INFUSION
4000.0000 [IU] | Freq: Once | INTRAVENOUS | Status: AC
  Administered 2018-10-03: 4000 [IU] via INTRAVENOUS
  Filled 2018-10-03: qty 4000

## 2018-10-03 MED ORDER — IOHEXOL 350 MG/ML SOLN
100.0000 mL | Freq: Once | INTRAVENOUS | Status: AC | PRN
  Administered 2018-10-03: 100 mL via INTRAVENOUS

## 2018-10-03 MED ORDER — PANTOPRAZOLE SODIUM 40 MG PO TBEC
40.0000 mg | DELAYED_RELEASE_TABLET | Freq: Every day | ORAL | Status: DC
  Administered 2018-10-03 – 2018-10-05 (×3): 40 mg via ORAL
  Filled 2018-10-03 (×3): qty 1

## 2018-10-03 MED ORDER — ONDANSETRON HCL 4 MG/2ML IJ SOLN
4.0000 mg | Freq: Once | INTRAMUSCULAR | Status: AC
  Administered 2018-10-03: 4 mg via INTRAVENOUS
  Filled 2018-10-03: qty 2

## 2018-10-03 MED ORDER — FAMOTIDINE IN NACL 20-0.9 MG/50ML-% IV SOLN
20.0000 mg | Freq: Once | INTRAVENOUS | Status: AC
  Administered 2018-10-03: 20 mg via INTRAVENOUS
  Filled 2018-10-03: qty 50

## 2018-10-03 MED ORDER — ALUM & MAG HYDROXIDE-SIMETH 200-200-20 MG/5ML PO SUSP
30.0000 mL | Freq: Once | ORAL | Status: AC
  Administered 2018-10-03: 30 mL via ORAL
  Filled 2018-10-03: qty 30

## 2018-10-03 MED ORDER — HEPARIN (PORCINE) 25000 UT/250ML-% IV SOLN
1050.0000 [IU]/h | INTRAVENOUS | Status: DC
  Administered 2018-10-03: 1050 [IU]/h via INTRAVENOUS
  Filled 2018-10-03: qty 250

## 2018-10-03 MED ORDER — LIDOCAINE VISCOUS HCL 2 % MT SOLN
15.0000 mL | Freq: Once | OROMUCOSAL | Status: AC
  Administered 2018-10-03: 15 mL via ORAL
  Filled 2018-10-03: qty 15

## 2018-10-03 MED ORDER — ACETAMINOPHEN 325 MG PO TABS: 650.0000 mg | ORAL_TABLET | ORAL | Status: DC | PRN

## 2018-10-03 MED ORDER — ALUM & MAG HYDROXIDE-SIMETH 200-200-20 MG/5ML PO SUSP
30.0000 mL | Freq: Once | ORAL | Status: DC
  Filled 2018-10-03: qty 30

## 2018-10-03 MED ORDER — MORPHINE SULFATE (PF) 4 MG/ML IV SOLN
4.0000 mg | Freq: Once | INTRAVENOUS | Status: AC
  Administered 2018-10-03: 4 mg via INTRAVENOUS
  Filled 2018-10-03: qty 1

## 2018-10-03 MED ORDER — ENOXAPARIN SODIUM 40 MG/0.4ML ~~LOC~~ SOLN
40.0000 mg | SUBCUTANEOUS | Status: DC
  Administered 2018-10-03: 40 mg via SUBCUTANEOUS
  Filled 2018-10-03 (×2): qty 0.4

## 2018-10-03 MED ORDER — ASPIRIN 81 MG PO CHEW
324.0000 mg | CHEWABLE_TABLET | Freq: Once | ORAL | Status: AC
  Administered 2018-10-03: 324 mg via ORAL
  Filled 2018-10-03: qty 4

## 2018-10-03 MED ORDER — SODIUM CHLORIDE 0.9% FLUSH: 3.0000 mL | Freq: Once | INTRAVENOUS | Status: DC

## 2018-10-03 MED ORDER — SODIUM CHLORIDE 0.9 % IV SOLN
INTRAVENOUS | Status: DC
  Administered 2018-10-03 – 2018-10-04 (×2): via INTRAVENOUS

## 2018-10-03 MED ORDER — HEPARIN (PORCINE) 25000 UT/250ML-% IV SOLN
800.0000 [IU]/h | INTRAVENOUS | Status: DC
  Administered 2018-10-03: 1050 [IU]/h via INTRAVENOUS
  Filled 2018-10-03: qty 250

## 2018-10-03 MED ORDER — ASPIRIN EC 325 MG PO TBEC
325.0000 mg | DELAYED_RELEASE_TABLET | Freq: Every day | ORAL | Status: DC
  Administered 2018-10-03 – 2018-10-05 (×3): 325 mg via ORAL
  Filled 2018-10-03 (×3): qty 1

## 2018-10-03 NOTE — Progress Notes (Signed)
CRITICAL VALUE ALERT  Critical Value: Trop 0.39  Date & Time Notied: 10/03/2018 1945  Provider Notified: Donne Hazel  Orders Received/Actions taken: Restart Heparin and make patient NPO after midnight.

## 2018-10-03 NOTE — Progress Notes (Signed)
  Echocardiogram 2D Echocardiogram  was attempted but patient was in CT.    Jennette Dubin 10/03/2018, 11:43 AM

## 2018-10-03 NOTE — Progress Notes (Signed)
ANTICOAGULATION CONSULT NOTE - Initial Consult  Pharmacy Consult for Heparin Indication: chest pain/ACS  Allergies  Allergen Reactions  . Lisinopril Swelling    Facial and tongue swelling 10/2012  . Penicillins Other (See Comments)    "Stomach pains" Has patient had a PCN reaction causing immediate rash, facial/tongue/throat swelling, SOB or lightheadedness with hypotension: Unk Has patient had a PCN reaction causing severe rash involving mucus membranes or skin necrosis: Unk Has patient had a PCN reaction that required hospitalization: Unk Has patient had a PCN reaction occurring within the last 10 years: No If all of the above answers are "NO", then may proceed with Cephalosporin use.     Patient Measurements:  IBW 75.3kg Last known weight: 73.2 kg  Heparin Dosing Weight: 73.2 kg  Vital Signs: Temp: 97.4 F (36.3 C) (03/08 0811) Temp Source: Oral (03/08 0811) BP: 155/105 (03/08 0811) Pulse Rate: 92 (03/08 0811)  Labs: Recent Labs    10/03/18 0811  HGB 13.6  HCT 42.9  PLT 311  CREATININE 1.65*    CrCl cannot be calculated (Unknown ideal weight.).   Medical History: Past Medical History:  Diagnosis Date  . Alcoholism ([place])   . Arthritis    hips, L shoulder  . Asthma   . CHF (congestive heart failure) ([place])   . Depression   . Family history of anesthesia complication   . Hyperlipidemia   . Hypertension     Assessment: CC/HPI: CP  PMH: CHF due to NICM, HTN, HLD, Asthma, and solitary kidney. Former crack cocaine abuse. GERD, COPD, HF, EF 09/30/17: 40-45%  Significant events:  Height/Weight from previous admission 5'11", 73.2kg  Anticoag:Start IV heparin for CP. Baseline CBC WNL.  Goal of Therapy:  Heparin level 0.3-0.7 units/ml Monitor platelets by anticoagulation protocol: Yes   Plan:  Heparin 4000 unit IV bolus Heparin infusion 1050 units/hr Check heparin level in 6-8 hrs Daily HL and CBC  Raymundo Rout S. Alford [place], PharmD, BCPS Clinical Staff  Pharmacist [place], [place] 10/03/2018,9:03 AM

## 2018-10-03 NOTE — ED Notes (Signed)
Patient transported to CT 

## 2018-10-03 NOTE — ED Provider Notes (Signed)
[facility] EMERGENCY DEPARTMENT Provider Note   CSN: [id number] Arrival date & time: 10/03/18  0805    History   Chief Complaint Chief Complaint  Patient presents with  . Chest Pain    HPI Joe Garcia is a 68 y.o. male.     HPI   68 year old male with epigastric and chest pain.  Woke up at approximately 7:00 AM this morning with symptoms.  Scribes burning and pressure in his epigastrium and lower sternal area.  He gets very transient relief after burping.  Feels nauseated has vomited.  No fevers or chills.  Denies acute dyspnea.  Reports a past history of asthma but does not feel like his breathing is changed from baseline.  No palpitations or diaphoresis.  Went to sleep in his usual state of health.  Cardiac cath 05/2017 with NICM, EF 25-30%, normal coronaries, frequent PVC's.    Past Medical History:  Diagnosis Date  . Alcoholism ([place])   . Arthritis    hips, L shoulder  . Asthma   . CHF (congestive heart failure) ([place])   . Depression   . Family history of anesthesia complication   . Hyperlipidemia   . Hypertension     Patient Active Problem List   Diagnosis Date Noted  . COPD with acute exacerbation ([place]) 07/26/2018  . Acute renal failure superimposed on stage 2 chronic kidney disease ([place]) 07/26/2018  . Nausea and vomiting 07/26/2018  . Hyperkalemia 07/26/2018  . Weight loss, unintentional 07/26/2018  . Malnutrition of moderate degree 07/16/2017  . 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 Solitary kidney, congenital 06/23/2017  . Nonischemic cardiomyopathy ([place])   . Multifocal PVCs   . Chronic combined systolic (congestive) and diastolic (congestive) heart failure ([place]) 06/11/2017  . Influenza A 07/17/2013  . Influenza 07/16/2013  . Tachycardia 07/16/2013  . Alcohol abuse 09/25/2012  . Alcohol dependence ([place]) 09/22/2012  . Essential hypertension 03/22/2007  . DEGENERATIVE DISC DISEASE 03/22/2007  . AVASCULAR NECROSIS  03/22/2007    Past Surgical History:  Procedure Laterality Date  . RIGHT/LEFT HEART CATH AND CORONARY ANGIOGRAPHY N/A 06/12/2017   Procedure: RIGHT/LEFT HEART CATH AND CORONARY ANGIOGRAPHY;  Surgeon: Jolaine Artist, MD;  Location: [facility];  Service: Cardiovascular;  Laterality: N/A;        Home Medications    Prior to Admission medications   Medication Sig Start Date End Date Taking? Authorizing Provider  acetaminophen (TYLENOL) 500 MG tablet Take 2 tablets (1,000 mg total) by mouth 3 (three) times daily as needed for headache (pain). 07/28/18   Dhungel, Nishant, MD  albuterol (PROVENTIL HFA;VENTOLIN HFA) 108 (90 Base) MCG/ACT inhaler Inhale 1-2 puffs into the lungs every 4 (four) hours as needed for wheezing or shortness of breath. 02/23/18   Melynda Ripple, MD  albuterol (PROVENTIL) (2.5 MG/3ML) 0.083% nebulizer solution Take 3 mLs (2.5 mg total) by nebulization every 6 (six) hours as needed for wheezing or shortness of breath. 02/23/18   Melynda Ripple, MD  amiodarone (PACERONE) 200 MG tablet Take 1 tablet (200 mg total) by mouth daily. 08/17/18   Shirley Friar, PA-C  amLODipine (NORVASC) 10 MG tablet Take 1 tablet (10 mg total) by mouth daily. Patient taking differently: Take 10 mg by mouth daily.  07/14/18   Bensimhon, Shaune Pascal, MD  carvedilol (COREG) 3.125 MG tablet TAKE 1 TABLET BY MOUTH TWICE DAILY WITH MEALS Patient taking differently: Take 3.125 mg by mouth 2 (two) times daily with a meal.  07/19/18   Bensimhon, Shaune Pascal, MD  dextromethorphan-guaiFENesin [facility] DM) 30-600 MG 12hr tablet Take 1 tablet by mouth 2 (two) times daily. 07/28/18   Dhungel, Nishant, MD  feeding supplement, ENSURE ENLIVE, (ENSURE ENLIVE) LIQD Take 237 mLs by mouth 2 (two) times daily between meals. 07/28/18   Dhungel, Nishant, MD  fluticasone (FLONASE) 50 MCG/ACT nasal spray Place 2 sprays into both nostrils daily. 02/23/18   Melynda Ripple, MD  furosemide (LASIX) 20 MG tablet  Take 1 tablet (20 mg total) by mouth daily as needed (weight gain). Weight gain Patient not taking: Reported on 08/17/2018 07/07/18   Bensimhon, Shaune Pascal, MD  isosorbide-hydrALAZINE (BIDIL) 20-37.5 MG tablet Take 0.5-1 tablets by mouth See admin instructions. Take 1/2 tablet every morning, 1 tablet at noon and 1/2 tablet in the evening     [provider]  losartan (COZAAR) 25 MG tablet Take 1 tablet (25 mg total) by mouth daily. 09/14/18   Shirley Friar, PA-C  polyethylene glycol powder (GLYCOLAX/MIRALAX) powder Take 17 g by mouth daily as needed for mild constipation.    [provider]  predniSONE (DELTASONE) 20 MG tablet Take 1 tablet (20 mg total) by mouth daily with breakfast. Patient not taking: Reported on 08/17/2018 07/28/18   Louellen Molder, MD  Spacer/Aero-Holding Chambers (AEROCHAMBER PLUS) inhaler Use as instructed 02/23/18   Melynda Ripple, MD  spironolactone (ALDACTONE) 25 MG tablet Take 0.5 tablets (12.5 mg total) by mouth daily. 07/07/18 10/05/18  Bensimhon, Shaune Pascal, MD    Family History Family History  Problem Relation Age of Onset  . Heart disease Father   . Heart disease Daughter        "fluid around heart"     Social History Social History   Tobacco Use  . Smoking status: Former Smoker    Packs/day: 0.10    Years: 45.00    Pack years: 4.50    Last attempt to quit: 2012    Years since quitting: 8.1  . Smokeless tobacco: Never Used  Substance Use Topics  . Alcohol use: No    Frequency: Never    Comment: Pt states no etoh since April 2014  . Drug use: No    Comment: Prior use of crack cocaine, quit 2012     Allergies   Lisinopril and Penicillins   Review of Systems Review of Systems All systems reviewed and negative, other than as noted in HPI.   Physical Exam Updated Vital Signs BP (!) 155/105   Pulse 92   Temp (!) 97.4 F (36.3 C) (Oral)   Resp 18   SpO2 99%   Physical Exam Vitals signs and nursing note reviewed.   Constitutional:      General: He is not in acute distress.    Appearance: He is well-developed.  HENT:     Head: Normocephalic and atraumatic.  Eyes:     General:        Right eye: No discharge.        Left eye: No discharge.     Conjunctiva/sclera: Conjunctivae normal.  Neck:     Musculoskeletal: Neck supple.  Cardiovascular:     Rate and Rhythm: Normal rate and regular rhythm.     Heart sounds: Normal heart sounds. No murmur. No friction rub. No gallop.   Pulmonary:     Effort: Pulmonary effort is normal. No respiratory distress.     Breath sounds: Normal breath sounds.  Abdominal:     General: There is no distension.  Palpations: Abdomen is soft.     Tenderness: There is no abdominal tenderness.  Musculoskeletal:        General: No tenderness.     Comments: Lower extremities symmetric as compared to each other. No calf tenderness. Negative Homan's. No palpable cords.   Skin:    General: Skin is warm and dry.  Neurological:     Mental Status: He is alert.  Psychiatric:        Behavior: Behavior normal.        Thought Content: Thought content normal.      ED Treatments / Results  Labs (all labs ordered are listed, but only abnormal results are displayed) Labs Reviewed  BASIC METABOLIC PANEL - Abnormal; Notable for the following components:      Result Value   CO2 21 (*)    Glucose, Bld 112 (*)    Creatinine, Ser 1.65 (*)    GFR calc non Af Amer 43 (*)    GFR calc Af Amer 50 (*)    All other components within normal limits  I-STAT TROPONIN, ED - Abnormal; Notable for the following components:   Troponin i, poc 0.16 (*)    All other components within normal limits  CBC  TROPONIN I  HEPARIN LEVEL (UNFRACTIONATED)  RAPID URINE DRUG SCREEN, HOSP PERFORMED  BRAIN NATRIURETIC PEPTIDE  D-DIMER, QUANTITATIVE (NOT AT [facility])    EKG EKG Interpretation  Date/Time:  Sunday October 03 2018 08:10:29 EDT Ventricular Rate:  87 PR Interval:  160 QRS Duration: 104 QT  Interval:  450 QTC Calculation: 541 R Axis:   50 Text Interpretation:  Normal sinus rhythm Left ventricular hypertrophy Prolonged QT Abnormal ECG Confirmed by Virgel Manifold [phone]) on 10/03/2018 8:18:41 AM   Radiology Dg Chest 2 View  Result Date: 10/03/2018 CLINICAL DATA:  Central chest pain. EXAM: CHEST - 2 VIEW COMPARISON:  Chest x-ray dated July 26, 2018. FINDINGS: The heart size and mediastinal contours are within normal limits. Normal pulmonary vascularity. The lungs remain hyperinflated. No focal consolidation, pleural effusion, or pneumothorax. No acute osseous abnormality. IMPRESSION: 1.  No active cardiopulmonary disease. 2. COPD. Electronically Signed   By: Titus Dubin M.D.   On: 10/03/2018 09:05    Procedures Procedures (including critical care time)  CRITICAL CARE Performed by: Virgel Manifold Total critical care time: 35 minutes Critical care time was exclusive of separately billable procedures and treating other patients. Critical care was necessary to treat or prevent imminent or life-threatening deterioration. Critical care was time spent personally by me on the following activities: development of treatment plan with patient and/or surrogate as well as nursing, discussions with consultants, evaluation of patient's response to treatment, examination of patient, obtaining history from patient or surrogate, ordering and performing treatments and interventions, ordering and review of laboratory studies, ordering and review of radiographic studies, pulse oximetry and re-evaluation of patient's condition.   Medications Ordered in ED Medications  famotidine (PEPCID) IVPB 20 mg premix (has no administration in time range)  alum & mag hydroxide-simeth (MAALOX/MYLANTA) 200-200-20 MG/5ML suspension 30 mL (has no administration in time range)    And  lidocaine (XYLOCAINE) 2 % viscous mouth solution 15 mL (has no administration in time range)  aspirin chewable tablet 324 mg (has  no administration in time range)  nitroGLYCERIN 50 mg in dextrose 5 % 250 mL (0.2 mg/mL) infusion (has no administration in time range)     Initial Impression / Assessment and Plan / ED Course  I have reviewed the  triage vital signs and the nursing notes.  Pertinent labs & imaging results that were available during my care of the patient were reviewed by me and considered in my medical decision making (see chart for details).  68 year old male with epigastric to lower sternal pain.  Question GI etiology, but he is not tender in his epigastrium.  On exam he actually points to mid sternum and left anterior chest as area of maximal pain.  EKG does not look sniffily changed from his last one this past December.  His initial troponin is elevated though.  Normal coronary arteries on cardiac catheterization approximately 2 years ago.  Was given aspirin and heparin.  Will consult cardiology.   Discussed briefly with cardiology. With normal coronaries on cath 2y ago, they do not feel like they need to admit patient as primary team.  Pt still with pain. Initial improvement with meds but now says he feels like it is worse than it was previously. Initially ordered heparin. Requested nursing actually put it on hold for now. I don't appreciate a murmur and denies back pain but, particularly with normal coronaries on prior cath, will get CTa to eval for possible dissection. Stage 2 CKD but renal function should be good enough for contrast.   Final Clinical Impressions(s) / ED Diagnoses   Final diagnoses:  Chest pain, unspecified type  Elevated troponin    ED Discharge Orders    None       Virgel Manifold, MD 10/15/18 1202

## 2018-10-03 NOTE — ED Notes (Signed)
Paged IM resident regarding heparin gtt due to negative CT scan. Page returned by Dr.Guilloud, new orders to not restart heparin infusion. Will continue to monitor.

## 2018-10-03 NOTE — ED Notes (Signed)
Admitting MD at bedside.

## 2018-10-03 NOTE — Progress Notes (Signed)
[place] for Heparin Indication: chest pain/ACS  Allergies  Allergen Reactions  . Lisinopril Swelling    Facial and tongue swelling 10/2012  . Penicillins Other (See Comments)    "Stomach pains" Has patient had a PCN reaction causing immediate rash, facial/tongue/throat swelling, SOB or lightheadedness with hypotension: Unk Has patient had a PCN reaction causing severe rash involving mucus membranes or skin necrosis: Unk Has patient had a PCN reaction that required hospitalization: Unk Has patient had a PCN reaction occurring within the last 10 years: No If all of the above answers are "NO", then may proceed with Cephalosporin use.     Patient Measurements: Height: 5\' 11"  (180.3 cm) Weight: 149 lb 14.6 oz (68 kg) IBW/kg (Calculated) : 75.3IBW 75.3kg Last known weight: 73.2 kg  Heparin Dosing Weight: 73.2 kg  Vital Signs: Temp: 97.9 F (36.6 C) (03/08 1931) Temp Source: Oral (03/08 1931) BP: 123/75 (03/08 1931) Pulse Rate: 96 (03/08 1525)  Labs: Recent Labs    10/03/18 0811 10/03/18 0910 10/03/18 1839  HGB 13.6  --   --   HCT 42.9  --   --   PLT 311  --   --   CREATININE 1.65*  --   --   TROPONINI  --  <0.03 0.39*    Estimated Creatinine Clearance: 42.9 mL/min (A) (by C-G formula based on SCr of 1.65 mg/dL (H)).   Medical History: Past Medical History:  Diagnosis Date  . Alcoholism ([place])   . Arthritis    hips, L shoulder  . Asthma   . CHF (congestive heart failure) ([place])   . Depression   . Family history of anesthesia complication   . Hyperlipidemia   . Hypertension     Assessment: 26 yom presenting with chest pain. Has hx of CHF due to NICM, HTN, HLD, Asthma, and solitary kidney. Former crack cocaine abuse. GERD, COPD, HF, EF 09/30/17: 40-45%  Troponin now increasing 0.16>0.39. Hgb 13.6, plt 311. Received 4000 units received 0938 and enoxaparin at 1712. No s/sx of bleeding.   Goal of Therapy:  Heparin level  0.3-0.7 units/ml Monitor platelets by anticoagulation protocol: Yes   Plan:  Restart heparin infusion at 1050 units/hr Check heparin level in 6 hrs Daily HL and CBC  Antonietta Jewel, PharmD, [place] Clinical Pharmacist  Pager: [phone] Phone: [phone] 10/03/2018,8:28 PM

## 2018-10-03 NOTE — ED Notes (Signed)
Patient transported to X-ray 

## 2018-10-03 NOTE — ED Notes (Signed)
Family bedside requesting update. Dr. Wilson Singer made aware.

## 2018-10-03 NOTE — ED Notes (Signed)
Patient transported to CT with this RN 

## 2018-10-03 NOTE — H&P (Addendum)
Date: 10/03/2018               Patient Name:  Joe Garcia MRN: [id number]  DOB: September 16, 1950 Age / Sex: 68 y.o., male   PCP: Medicine, [facility]         Medical Service: Internal Medicine Teaching Service         Attending Physician: Dr. Evette Doffing, Mallie Mussel, *    First Contact: Dr. Laural Golden Pager: [phone]  Second Contact: Dr. Philipp Ovens Pager: [phone]       After Hours (After 5p/  First Contact Pager: [phone]  weekends / holidays): Second Contact Pager: [phone]   Chief Complaint: Chest pain  History of Present Illness: [name] is a 68 yo male with hypertension, hyperlipidemia, and HFrEF presenting with an acute onset of chest pain. He states he was in usual state of health until this morning when he went to the bathroom and started sweating profusely and having substernal chest pain.  He described the chest pain as severe pressure that radiated to his left chest.  At first he thought it was gas and also started vomiting.  However his symptoms did not improve after passing gas or vomiting.  He states at first his symptoms eased up for a few minutes however then the chest pain came on stronger and has been constant since this morning.  He denies any left arm or jaw pain, shortness of breath, cough, fevers or abdominal pain.  He states he has had reflux in the past and this does not feel the same.  He reports nitroglycerin took his pain from a 9/10 to 5/10. He denies any recent changes in his medications or diet.   Meds:  Current Meds  Medication Sig  . acetaminophen (TYLENOL) 500 MG tablet Take 2 tablets (1,000 mg total) by mouth 3 (three) times daily as needed for headache (pain).  Marland Kitchen albuterol (PROVENTIL HFA;VENTOLIN HFA) 108 (90 Base) MCG/ACT inhaler Inhale 1-2 puffs into the lungs every 4 (four) hours as needed for wheezing or shortness of breath.  Marland Kitchen albuterol (PROVENTIL) (2.5 MG/3ML) 0.083% nebulizer solution Take 3 mLs (2.5 mg total) by nebulization  every 6 (six) hours as needed for wheezing or shortness of breath.  Marland Kitchen amiodarone (PACERONE) 200 MG tablet Take 1 tablet (200 mg total) by mouth daily.  Marland Kitchen amLODipine (NORVASC) 10 MG tablet Take 1 tablet (10 mg total) by mouth daily. (Patient taking differently: Take 5 mg by mouth daily. )  . carvedilol (COREG) 3.125 MG tablet TAKE 1 TABLET BY MOUTH TWICE DAILY WITH MEALS (Patient taking differently: Take 12.5 mg by mouth 2 (two) times daily with a meal. )  . furosemide (LASIX) 20 MG tablet Take 1 tablet (20 mg total) by mouth daily as needed (weight gain). Weight gain  . isosorbide-hydrALAZINE (BIDIL) 20-37.5 MG tablet Take 2 tablets by mouth 3 (three) times daily.   Marland Kitchen losartan (COZAAR) 25 MG tablet Take 1 tablet (25 mg total) by mouth daily.  . polyethylene glycol powder (GLYCOLAX/MIRALAX) powder Take 17 g by mouth daily as needed for mild constipation.  Marland Kitchen spironolactone (ALDACTONE) 25 MG tablet Take 0.5 tablets (12.5 mg total) by mouth daily.     Allergies: Allergies as of 10/03/2018 - Review Complete 10/03/2018  Allergen Reaction Noted  . Lisinopril Swelling 08/13/2017  . Penicillins Other (See Comments)    Past Medical History:  Diagnosis Date  . Alcoholism ([place])   . Arthritis    hips, L shoulder  .  Asthma   . CHF (congestive heart failure) ([place])   . Depression   . Family history of anesthesia complication   . Hyperlipidemia   . Hypertension     Family History:  Family History  Problem Relation Age of Onset  . Heart disease Father   . Heart disease Daughter        "fluid around heart"     Social History:  He lives at home with his family. He has been on disability for many years due to his HF. He used to work in [employer] prior to disability. He is able to perform all his ADL's. He used to smoke 2 cigarettes a day for many years; quit 8 years ago. States he used to drink alcohol regularly in the past but not currently. Denies any illicit drug use.   Review of  Systems: A complete ROS was negative except as per HPI.   Physical Exam: Blood pressure (!) 142/86, pulse 65, temperature (!) 97.4 F (36.3 C), temperature source Oral, resp. rate 18, height 5\' 11"  (1.803 m), weight 65.8 kg, SpO2 99 %.  Physical Exam  Constitutional: He is oriented to person, place, and time and well-developed, well-nourished, and in no distress. No distress.  Cardiovascular: Normal rate, regular rhythm and normal heart sounds.  No murmur heard. Pulmonary/Chest: Effort normal and breath sounds normal. No respiratory distress. He has no wheezes. He has no rales. He exhibits no tenderness.  Abdominal: Soft. Bowel sounds are normal. He exhibits no distension. There is no abdominal tenderness.  Musculoskeletal:        General: No edema.  Neurological: He is alert and oriented to person, place, and time.  Skin: Skin is warm and dry. He is not diaphoretic.  Psychiatric: Mood, memory, affect and judgment normal.    EKG: personally reviewed my interpretation is ventricular bigeminy, sinus rhythm   CXR: personally reviewed my interpretation is no acute cardiopulmonary disease   Assessment & Plan by Problem: Active Problems:   Chest pain  Joe Garcia is a 68 yo male with hypertension, hyperlipidemia, and HFrEF presenting with an acute onset of chest pain. He states he was in usual state of health until this morning when he went to the bathroom and started sweating profusely and having substernal chest pain.  He described the chest pain as severe pressure that radiated to his left chest.   Chest pain: Patient presented with acute onset of substernal chest pain, diaphoresis, nausea and vomiting. I-stat troponin 0.16, troponin <0.03. EKG illustrated ventricular bigeminy, no ST segment changes or elevations. Heart score 4. He has some risk factors including HTN, history of tobacco use and family history of heart disease. He had a right/left heart cath and coronary angiography  in 05/2017 that did not show any CAD, NICM EF 25-30% and frequent PVC's for which he takes amiodarone. History of alcohol and cocaine use per chart review. Endorsing gas and possible indigestion, will order a GI cocktail as well.  - Cardiac monitoring - Trend troponin - Repeat EKG in am - Continue nitroglycerin  - Follow up UDS  - Lipid panel - GI cocktail  - Start pantoprazole 40 mg   HFrEF: Most recent echo 09/30/17 showed EF 40-45%, mild diffuse hypokinesis, grade 2 diastolic dysfunction, mild MV regurgitation. On carvedilol 12.5 mg BID, furosemide 20 mg qd - Continue carvedilol 12.5 mg BID   HTN: home medications include carvedilol 12.5 mg bid, amlodipine 200 mg qd, isosorbide-hydralazine TID, losartan 25 mg qd, and spironolactone  12.5 mg qd  - Continue carvedilol 12.5 mg BID    Diet: Regular VTE ppx: Lovenox Full Code   Dispo: Admit patient to Observation with expected length of stay less than 2 midnights.  SignedMike Craze, DO 10/03/2018, 11:02 AM  Pager: [phone]

## 2018-10-03 NOTE — ED Triage Notes (Signed)
Patient reports central chest pain that awoke him from sleep this am. He reports pain feels like a pressure and states it feels like he has gas. Denies any pain radiation or sob. resp e/u, nad.

## 2018-10-03 NOTE — ED Notes (Signed)
Hold heparin infusion per Dr. Wilson Singer.

## 2018-10-03 NOTE — Progress Notes (Signed)
Patient presented with exertional substernal chest pain radiating to the left arm associated with diaphoresis and relived with SL nitro. He had a left heart cath in 2018 that was without CAD. Initial troponin was <0.03. Repeat elevated to 0.39. We have restarted heparin and discussed the case further with Cardiology, Dr. Radford Pax. She agreed with restarting heparin and recommended a repeat echocardiogram and formal cards consult in the AM. Patient will be NPO at midnight.

## 2018-10-03 NOTE — ED Notes (Signed)
ED Provider at bedside. 

## 2018-10-03 NOTE — ED Notes (Signed)
Pt returned to room from CT with this RN

## 2018-10-03 NOTE — ED Notes (Signed)
ED TO INPATIENT HANDOFF REPORT  ED Nurse Name and Phone #:  Kelby Aline Name/Age/Gender Joe Garcia 68 y.o. male Room/Bed: 022C/022C  Code Status   Code Status: Prior  Home/SNF/Other Home Patient oriented to: self, place, time and situation Is this baseline? Yes   Triage Complete: Triage complete  Chief Complaint cp  Triage Note Patient reports central chest pain that awoke him from sleep this am. He reports pain feels like a pressure and states it feels like he has gas. Denies any pain radiation or sob. resp e/u, nad.    Allergies Allergies  Allergen Reactions  . Lisinopril Swelling    Facial and tongue swelling 10/2012  . Penicillins Other (See Comments)    "Stomach pains" Has patient had a PCN reaction causing immediate rash, facial/tongue/throat swelling, SOB or lightheadedness with hypotension: Unk Has patient had a PCN reaction causing severe rash involving mucus membranes or skin necrosis: Unk Has patient had a PCN reaction that required hospitalization: Unk Has patient had a PCN reaction occurring within the last 10 years: No If all of the above answers are "NO", then may proceed with Cephalosporin use.     Level of Care/Admitting Diagnosis ED Disposition    ED Disposition Condition [place] [facility] Area: [place] [100100]  Level of Care: Progressive [102]  Diagnosis: Chest pain [607371]  Admitting Physician: Axel Filler [[id number]]  Attending Physician: Axel Filler [phone]  PT Class (Do Not Modify): Observation [104]  PT Acc Code (Do Not Modify): Observation [10022]       B Medical/Surgery History Past Medical History:  Diagnosis Date  . Alcoholism ([place])   . Arthritis    hips, L shoulder  . Asthma   . CHF (congestive heart failure) ([place])   . Depression   . Family history of anesthesia complication   . Hyperlipidemia   . Hypertension    Past Surgical History:  Procedure Laterality  Date  . RIGHT/LEFT HEART CATH AND CORONARY ANGIOGRAPHY N/A 06/12/2017   Procedure: RIGHT/LEFT HEART CATH AND CORONARY ANGIOGRAPHY;  Surgeon: Jolaine Artist, MD;  Location: [facility];  Service: Cardiovascular;  Laterality: N/A;     A IV Location/Drains/Wounds Patient Lines/Drains/Airways Status   Active Line/Drains/Airways    Name:   Placement date:   Placement time:   Site:   Days:   Peripheral IV 10/03/18 Left Wrist   10/03/18    0926    Wrist   less than 1   Peripheral IV 10/03/18 Right Antecubital   10/03/18    1020    Antecubital   less than 1          Intake/Output Last 24 hours No intake or output data in the 24 hours ending 10/03/18 1453  Labs/Imaging Results for orders placed or performed during the hospital encounter of 10/03/18 (from the past 48 hour(s))  Basic metabolic panel     Status: Abnormal   Collection Time: 10/03/18  8:11 AM  Result Value Ref Range   Sodium 140 135 - 145 mmol/L   Potassium 4.1 3.5 - 5.1 mmol/L   Chloride 107 98 - 111 mmol/L   CO2 21 (L) 22 - 32 mmol/L   Glucose, Bld 112 (H) 70 - 99 mg/dL   BUN 13 8 - 23 mg/dL   Creatinine, Ser 1.65 (H) 0.61 - 1.24 mg/dL   Calcium 9.5 8.9 - 10.3 mg/dL   GFR calc non Af Amer 43 (L) >  60 mL/min   GFR calc Af Amer 50 (L) >60 mL/min   Anion gap 12 5 - 15    Comment: Performed at [facility] [address], [place] [address]  CBC     Status: None   Collection Time: 10/03/18  8:11 AM  Result Value Ref Range   WBC 8.5 4.0 - 10.5 K/uL   RBC 5.01 4.22 - 5.81 MIL/uL   Hemoglobin 13.6 13.0 - 17.0 g/dL   HCT 42.9 39.0 - 52.0 %   MCV 85.6 80.0 - 100.0 fL   MCH 27.1 26.0 - 34.0 pg   MCHC 31.7 30.0 - 36.0 g/dL   RDW 15.5 11.5 - 15.5 %   Platelets 311 150 - 400 K/uL   nRBC 0.0 0.0 - 0.2 %    Comment: Performed at [facility], [place] [address], [place], [place] [address]  Brain natriuretic peptide     Status: Abnormal   Collection Time: 10/03/18  8:11 AM  Result Value Ref Range    B Natriuretic Peptide 106.5 (H) 0.0 - 100.0 pg/mL    Comment: Performed at [facility] [address] [place], [place] [address]  I-stat troponin, ED     Status: Abnormal   Collection Time: 10/03/18  8:24 AM  Result Value Ref Range   Troponin i, poc 0.16 (HH) 0.00 - 0.08 ng/mL   Comment NOTIFIED PHYSICIAN    Comment 3            Comment: Due to the release kinetics of cTnI, a negative result within the first hours of the onset of symptoms does not rule out myocardial infarction with certainty. If myocardial infarction is still suspected, repeat the test at appropriate intervals.   Troponin I - ONCE - STAT     Status: None   Collection Time: 10/03/18  9:10 AM  Result Value Ref Range   Troponin I <0.03 <0.03 ng/mL    Comment: Performed at [facility], [place] [address] [place], [place] [address]  D-dimer, quantitative (not at [facility])     Status: None   Collection Time: 10/03/18  9:14 AM  Result Value Ref Range   D-Dimer, Quant 0.41 0.00 - 0.50 ug/mL-FEU    Comment: (NOTE) At the manufacturer cut-off of 0.50 ug/mL FEU, this assay has been documented to exclude PE with a sensitivity and negative predictive value of 97 to 99%.  At this time, this assay has not been approved by the FDA to exclude DVT/VTE. Results should be correlated with clinical presentation. Performed at [facility], [place] [address] [place], [place] [address]   Rapid urine drug screen (hospital performed)     Status: Abnormal   Collection Time: 10/03/18  1:20 PM  Result Value Ref Range   Opiates POSITIVE (A) NONE DETECTED   Cocaine NONE DETECTED NONE DETECTED   Benzodiazepines NONE DETECTED NONE DETECTED   Amphetamines NONE DETECTED NONE DETECTED   Tetrahydrocannabinol NONE DETECTED NONE DETECTED   Barbiturates NONE DETECTED NONE DETECTED    Comment: (NOTE) DRUG SCREEN FOR MEDICAL PURPOSES ONLY.  IF CONFIRMATION IS NEEDED FOR ANY PURPOSE, NOTIFY LAB WITHIN 5 DAYS. LOWEST DETECTABLE  LIMITS FOR URINE DRUG SCREEN Drug Class                     Cutoff (ng/mL) Amphetamine and metabolites    1000 Barbiturate and metabolites    200 Benzodiazepine  093 Tricyclics and metabolites     300 Opiates and metabolites        300 Cocaine and metabolites        300 THC                            50 Performed at [facility], [place] [address], [place], [place] [address]    Dg Chest 2 View  Result Date: 10/03/2018 CLINICAL DATA:  Central chest pain. EXAM: CHEST - 2 VIEW COMPARISON:  Chest x-ray dated July 26, 2018. FINDINGS: The heart size and mediastinal contours are within normal limits. Normal pulmonary vascularity. The lungs remain hyperinflated. No focal consolidation, pleural effusion, or pneumothorax. No acute osseous abnormality. IMPRESSION: 1.  No active cardiopulmonary disease. 2. COPD. Electronically Signed   By: Titus Dubin M.D.   On: 10/03/2018 09:05   Ct Angio Chest/abd/pel For Dissection W And/or Wo Contrast  Result Date: 10/03/2018 CLINICAL DATA:  Chest pain since yesterday morning EXAM: CT ANGIOGRAPHY CHEST, ABDOMEN AND PELVIS TECHNIQUE: Multidetector CT imaging through the chest, abdomen and pelvis was performed using the standard protocol during bolus administration of intravenous contrast. Multiplanar reconstructed images and MIPs were obtained and reviewed to evaluate the vascular anatomy. CONTRAST:  180mL OMNIPAQUE IOHEXOL 350 MG/ML SOLN COMPARISON:  Abdomen and pelvis CT 06/17/2017 FINDINGS: CTA CHEST FINDINGS Cardiovascular: Noncontrast phase shows no intramural hematoma within the aorta. No notable atheromatous calcifications. Postcontrast imaging shows no aortic dissection. There is no aneurysm of the aorta. Well opacified pulmonary arteries that are negative for filling defect. Prominent left ventricular chamber size which may be related to cardiac cycle. Overall there is no cardiomegaly. No pericardial effusion. Mediastinum/Nodes: Negative  for adenopathy or mass Lungs/Pleura: Generalized airway thickening. Mild atelectasis or scarring in the right lower lobe. Subtle generalized centrilobular ground-glass nodules in the apical lungs. No cysts are seen. Musculoskeletal: Spondylosis with multi-level bridging osteophyte in the lower thoracic spine. Exaggerated thoracic kyphosis. Review of the MIP images confirms the above findings. CTA ABDOMEN AND PELVIS FINDINGS VASCULAR Aorta: Mild atheromatous changes.  No dissection or aneurysm Celiac: Mild proximal narrowing due to calcified plaque. Smooth and widely patent branches SMA: Smooth and widely patent branches Renals: A tiny right renal artery is present. Left renal artery is smooth and widely patent IMA: Patent Inflow: Mild atherosclerotic plaque.  No aneurysm or dissection. Veins: Negative in the arterial phase Review of the MIP images confirms the above findings. NON-VASCULAR Hepatobiliary: No focal liver abnormality.No evidence of biliary obstruction or stone. Pancreas: Prominent but still within normal limits main pancreatic duct diameter of 2 mm. Spleen: Unremarkable. Adrenals/Urinary Tract: Negative adrenals. Nonenhancing severely atrophic right kidney with peripherally calcified and crenulated mass attributed to remote insult. Mild patchy left renal cortical scarring. Unremarkable bladder. Stomach/Bowel: No obstruction. Appendicoliths without superimposed inflammation. No evidence of bowel inflammation. Lymphatic: No mass or adenopathy. Reproductive:Negative Other: No ascites or pneumoperitoneum. 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 Sclerosis and subchondral cyst formation is extensive. Sacroiliac fusion with productive change suggesting this is osteoarthritic. No acute finding Review of the MIP images confirms the above findings. IMPRESSION: 1. No evidence of acute aortic syndrome. 2. Airway thickening and apical ground-glass nodules which  could be infectious bronchitis/bronchiolitis, smoking-related respiratory bronchiolitis, or hypersensitivity pneumonitis. 3. 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 Mild left renal cortical scarring. 4. Mild atherosclerosis of the aorta. 5. Notably severe hip osteoarthritis on both  sides. Electronically Signed   By: Monte Fantasia M.D.   On: 10/03/2018 12:41    Pending Labs Unresulted Labs (From admission, onward)    Start     Ordered   10/04/18 0500  CBC  Daily,   R     10/03/18 0906   10/04/18 0500  Lipid panel  Tomorrow morning,   R     10/03/18 1230          Vitals/Pain Today's Vitals   10/03/18 1345 10/03/18 1400 10/03/18 1413 10/03/18 1445  BP: 118/73 113/73  111/67  Pulse: (!) 58 (!) 56  (!) 54  Resp: 20 16  15   Temp:      TempSrc:      SpO2: 97% 97%  97%  Weight:      Height:      PainSc:   2      Isolation Precautions No active isolations  Medications Medications  nitroGLYCERIN 50 mg in dextrose 5 % 250 mL (0.2 mg/mL) infusion (10 mcg/min Intravenous Rate/Dose Change 10/03/18 1122)  0.9 %  sodium chloride infusion ( Intravenous New Bag/Given 10/03/18 1016)  enoxaparin (LOVENOX) injection 40 mg (has no administration in time range)  alum & mag hydroxide-simeth (MAALOX/MYLANTA) 200-200-20 MG/5ML suspension 30 mL (has no administration in time range)  pantoprazole (PROTONIX) EC tablet 40 mg (has no administration in time range)  famotidine (PEPCID) IVPB 20 mg premix (0 mg Intravenous Stopped 10/03/18 1411)  alum & mag hydroxide-simeth (MAALOX/MYLANTA) 200-200-20 MG/5ML suspension 30 mL (30 mLs Oral Given 10/03/18 0952)    And  lidocaine (XYLOCAINE) 2 % viscous mouth solution 15 mL (15 mLs Oral Given 10/03/18 0951)  aspirin chewable tablet 324 mg (324 mg Oral Given 10/03/18 0933)  heparin bolus via infusion 4,000 Units (4,000 Units Intravenous Bolus from Bag 10/03/18 0945)  morphine 4 MG/ML injection 4 mg (4 mg Intravenous Given 10/03/18 0952)  ondansetron  (ZOFRAN) injection 4 mg (4 mg Intravenous Given 10/03/18 0930)  iohexol (OMNIPAQUE) 350 MG/ML injection 100 mL (100 mLs Intravenous Contrast Given 10/03/18 1145)    Mobility walks Low fall risk   Focused Assessments Cardiac Assessment Handoff:    Lab Results  Component Value Date   TROPONINI <0.03 10/03/2018   Lab Results  Component Value Date   DDIMER 0.41 10/03/2018   Does the Patient currently have chest pain? Yes     R Recommendations: See Admitting Provider Note  Report given to:   Additional Notes:

## 2018-10-04 ENCOUNTER — Observation Stay (HOSPITAL_COMMUNITY): Payer: Medicare Other

## 2018-10-04 ENCOUNTER — Encounter (HOSPITAL_COMMUNITY)
Admission: EM | Disposition: A | Discharge status: Home / Self Care | Payer: Self-pay | Source: Home / Self Care | Attending: Student in an Organized Health Care Education/Training Program

## 2018-10-04 DIAGNOSIS — I428 Other cardiomyopathies: Secondary | ICD-10-CM | POA: Diagnosis not present

## 2018-10-04 DIAGNOSIS — I110 Hypertensive heart disease with heart failure: Secondary | ICD-10-CM | POA: Diagnosis not present

## 2018-10-04 DIAGNOSIS — R7989 Other specified abnormal findings of blood chemistry: Secondary | ICD-10-CM

## 2018-10-04 DIAGNOSIS — I5042 Chronic combined systolic (congestive) and diastolic (congestive) heart failure: Secondary | ICD-10-CM | POA: Diagnosis present

## 2018-10-04 DIAGNOSIS — E785 Hyperlipidemia, unspecified: Secondary | ICD-10-CM | POA: Diagnosis present

## 2018-10-04 DIAGNOSIS — I214 Non-ST elevation (NSTEMI) myocardial infarction: Secondary | ICD-10-CM | POA: Diagnosis present

## 2018-10-04 DIAGNOSIS — Z8249 Family history of ischemic heart disease and other diseases of the circulatory system: Secondary | ICD-10-CM | POA: Diagnosis not present

## 2018-10-04 DIAGNOSIS — I340 Nonrheumatic mitral (valve) insufficiency: Secondary | ICD-10-CM

## 2018-10-04 DIAGNOSIS — I200 Unstable angina: Secondary | ICD-10-CM

## 2018-10-04 DIAGNOSIS — I5020 Unspecified systolic (congestive) heart failure: Secondary | ICD-10-CM | POA: Diagnosis not present

## 2018-10-04 DIAGNOSIS — N182 Chronic kidney disease, stage 2 (mild): Secondary | ICD-10-CM | POA: Diagnosis present

## 2018-10-04 DIAGNOSIS — I5181 Takotsubo syndrome: Secondary | ICD-10-CM | POA: Diagnosis present

## 2018-10-04 DIAGNOSIS — F1020 Alcohol dependence, uncomplicated: Secondary | ICD-10-CM | POA: Diagnosis present

## 2018-10-04 DIAGNOSIS — R079 Chest pain, unspecified: Secondary | ICD-10-CM | POA: Diagnosis not present

## 2018-10-04 DIAGNOSIS — Z888 Allergy status to other drugs, medicaments and biological substances status: Secondary | ICD-10-CM | POA: Diagnosis not present

## 2018-10-04 DIAGNOSIS — Z79899 Other long term (current) drug therapy: Secondary | ICD-10-CM | POA: Diagnosis not present

## 2018-10-04 DIAGNOSIS — I130 Hypertensive heart and chronic kidney disease with heart failure and stage 1 through stage 4 chronic kidney disease, or unspecified chronic kidney disease: Secondary | ICD-10-CM | POA: Diagnosis present

## 2018-10-04 DIAGNOSIS — Z880 Allergy status to penicillin: Secondary | ICD-10-CM | POA: Diagnosis not present

## 2018-10-04 DIAGNOSIS — Z87891 Personal history of nicotine dependence: Secondary | ICD-10-CM | POA: Diagnosis not present

## 2018-10-04 HISTORY — PX: LEFT HEART CATH AND CORONARY ANGIOGRAPHY: CATH118249

## 2018-10-04 LAB — BASIC METABOLIC PANEL
Anion gap: 9 (ref 5–15)
BUN: 12 mg/dL (ref 8–23)
CO2: 19 mmol/L — ABNORMAL LOW (ref 22–32)
Calcium: 8.5 mg/dL — ABNORMAL LOW (ref 8.9–10.3)
Chloride: 108 mmol/L (ref 98–111)
Creatinine, Ser: 1.4 mg/dL — ABNORMAL HIGH (ref 0.61–1.24)
GFR calc Af Amer: >60 mL/min (ref >60)
GFR calc non Af Amer: 52 mL/min — ABNORMAL LOW (ref >60)
GLUCOSE: 133 mg/dL — AB (ref 70–99)
Potassium: 3.8 mmol/L (ref 3.5–5.1)
Sodium: 136 mmol/L (ref 135–145)

## 2018-10-04 LAB — CBC
HCT: 35.9 % — ABNORMAL LOW (ref 39.0–52.0)
Hemoglobin: 11.6 g/dL — ABNORMAL LOW (ref 13.0–17.0)
MCH: 27 pg (ref 26.0–34.0)
MCHC: 32.3 g/dL (ref 30.0–36.0)
MCV: 83.7 fL (ref 80.0–100.0)
PLATELETS: 211 10*3/uL (ref 150–400)
RBC: 4.29 MIL/uL (ref 4.22–5.81)
RDW: 15.3 % (ref 11.5–15.5)
WBC: 4.4 10*3/uL (ref 4.0–10.5)
nRBC: 0 % (ref 0.0–0.2)

## 2018-10-04 LAB — LIPID PANEL
Cholesterol: 154 mg/dL (ref 0–200)
HDL: 48 mg/dL (ref >40)
LDL Cholesterol: 98 mg/dL (ref 0–99)
Total CHOL/HDL Ratio: 3.2 RATIO
Triglycerides: 39 mg/dL (ref <150)
VLDL: 8 mg/dL (ref 0–40)

## 2018-10-04 LAB — ECHOCARDIOGRAM COMPLETE
Height: 71 in
WEIGHTICAEL: 2398.6 [oz_av]

## 2018-10-04 LAB — HEPARIN LEVEL (UNFRACTIONATED)
HEPARIN UNFRACTIONATED: 0.86 [IU]/mL — AB (ref 0.30–0.70)
Heparin Unfractionated: 0.77 IU/mL — ABNORMAL HIGH (ref 0.30–0.70)

## 2018-10-04 LAB — TROPONIN I: Troponin I: 0.3 ng/mL (ref <0.03)

## 2018-10-04 SURGERY — LEFT HEART CATH AND CORONARY ANGIOGRAPHY
Anesthesia: LOCAL

## 2018-10-04 MED ORDER — FENTANYL CITRATE (PF) 100 MCG/2ML IJ SOLN
INTRAMUSCULAR | Status: DC | PRN
  Administered 2018-10-04: 50 ug via INTRAVENOUS

## 2018-10-04 MED ORDER — SODIUM CHLORIDE 0.9 % IV SOLN: INTRAVENOUS | Status: DC

## 2018-10-04 MED ORDER — SODIUM CHLORIDE 0.9% FLUSH: 3.0000 mL | INTRAVENOUS | Status: DC | PRN

## 2018-10-04 MED ORDER — HEPARIN (PORCINE) IN NACL 1000-0.9 UT/500ML-% IV SOLN
INTRAVENOUS | Status: AC
  Filled 2018-10-04: qty 1000

## 2018-10-04 MED ORDER — VERAPAMIL HCL 2.5 MG/ML IV SOLN
INTRAVENOUS | Status: AC
  Filled 2018-10-04: qty 2

## 2018-10-04 MED ORDER — VERAPAMIL HCL 2.5 MG/ML IV SOLN
INTRAVENOUS | Status: DC | PRN
  Administered 2018-10-04: 10 mL via INTRA_ARTERIAL

## 2018-10-04 MED ORDER — MIDAZOLAM HCL 2 MG/2ML IJ SOLN
INTRAMUSCULAR | Status: AC
  Filled 2018-10-04: qty 2

## 2018-10-04 MED ORDER — MIDAZOLAM HCL 2 MG/2ML IJ SOLN
INTRAMUSCULAR | Status: DC | PRN
  Administered 2018-10-04: 2 mg via INTRAVENOUS

## 2018-10-04 MED ORDER — HEPARIN SODIUM (PORCINE) 1000 UNIT/ML IJ SOLN
INTRAMUSCULAR | Status: DC | PRN
  Administered 2018-10-04: 3500 [IU] via INTRAVENOUS

## 2018-10-04 MED ORDER — SODIUM CHLORIDE 0.9 % IV SOLN
INTRAVENOUS | Status: AC
  Administered 2018-10-04: 17:00:00 via INTRAVENOUS

## 2018-10-04 MED ORDER — ONDANSETRON HCL 4 MG/2ML IJ SOLN: 4.0000 mg | Freq: Four times a day (QID) | INTRAMUSCULAR | Status: DC | PRN

## 2018-10-04 MED ORDER — HEPARIN (PORCINE) IN NACL 1000-0.9 UT/500ML-% IV SOLN
INTRAVENOUS | Status: DC | PRN
  Administered 2018-10-04 (×2): 500 mL

## 2018-10-04 MED ORDER — ENOXAPARIN SODIUM 40 MG/0.4ML ~~LOC~~ SOLN
40.0000 mg | SUBCUTANEOUS | Status: DC
  Administered 2018-10-05: 40 mg via SUBCUTANEOUS
  Filled 2018-10-04: qty 0.4

## 2018-10-04 MED ORDER — SODIUM CHLORIDE 0.9% FLUSH
3.0000 mL | Freq: Two times a day (BID) | INTRAVENOUS | Status: DC
  Administered 2018-10-04 – 2018-10-05 (×2): 3 mL via INTRAVENOUS

## 2018-10-04 MED ORDER — ACETAMINOPHEN 325 MG PO TABS: 650.0000 mg | ORAL_TABLET | ORAL | Status: DC | PRN

## 2018-10-04 MED ORDER — ALBUTEROL SULFATE (2.5 MG/3ML) 0.083% IN NEBU: 3.0000 mL | INHALATION_SOLUTION | RESPIRATORY_TRACT | Status: DC | PRN

## 2018-10-04 MED ORDER — LIDOCAINE HCL (PF) 1 % IJ SOLN
INTRAMUSCULAR | Status: DC | PRN
  Administered 2018-10-04: 2 mL

## 2018-10-04 MED ORDER — HEPARIN SODIUM (PORCINE) 1000 UNIT/ML IJ SOLN
INTRAMUSCULAR | Status: AC
  Filled 2018-10-04: qty 1

## 2018-10-04 MED ORDER — LIDOCAINE HCL (PF) 1 % IJ SOLN
INTRAMUSCULAR | Status: AC
  Filled 2018-10-04: qty 30

## 2018-10-04 MED ORDER — FENTANYL CITRATE (PF) 100 MCG/2ML IJ SOLN
INTRAMUSCULAR | Status: AC
  Filled 2018-10-04: qty 2

## 2018-10-04 MED ORDER — SODIUM CHLORIDE 0.9 % IV SOLN: 250.0000 mL | INTRAVENOUS | Status: DC | PRN

## 2018-10-04 MED ORDER — IOHEXOL 350 MG/ML SOLN
INTRAVENOUS | Status: DC | PRN
  Administered 2018-10-04: 45 mL via INTRA_ARTERIAL

## 2018-10-04 SURGICAL SUPPLY — 8 items
CATH 5FR JL3.5 JR4 ANG PIG MP (CATHETERS) ×1 IMPLANT
GLIDESHEATH SLEND SS 6F .021 (SHEATH) ×1 IMPLANT
GUIDEWIRE INQWIRE 1.5J.035X260 (WIRE) IMPLANT
INQWIRE 1.5J .035X260CM (WIRE) ×2
KIT HEART LEFT (KITS) ×2 IMPLANT
PACK CARDIAC CATHETERIZATION (CUSTOM PROCEDURE TRAY) ×2 IMPLANT
TRANSDUCER W/STOPCOCK (MISCELLANEOUS) ×2 IMPLANT
TUBING CIL FLEX 10 FLL-RA (TUBING) ×2 IMPLANT

## 2018-10-04 NOTE — Progress Notes (Signed)
  Echocardiogram 2D Echocardiogram has been performed.  Jennette Dubin 10/04/2018, 3:04 PM

## 2018-10-04 NOTE — H&P (View-Only) (Signed)
Cardiology Consultation:   Patient ID: Joe Garcia MRN: [id number]; DOB: 1950/12/07  Admit date: 10/03/2018 Date of Consult: 10/04/2018  Primary Care Provider: Medicine, [facility] Primary Cardiologist: No primary care provider on file.  Primary Electrophysiologist:  None    Patient Profile:   Joe Garcia is a 68 y.o. male with a hx of ETOH use, HTN, HL, depression, arthritis and NICM with EF previously 30-35%  who is being seen today for the evaluation of chest pain at the request of Dr. Evette Doffing.  History of Present Illness:   Joe Garcia with PMH of hx of ETOH use, HTN, HL, depression, arthritis and NICM with EF previously 30-35%. He is followed in the AHF clinic. Underwent cardiac cath in 11/18 with normal coronaries and compensated HF. Freq PVCs noted and placed on amiodarone. Re-admitted shortly afterwards with abd pain, with negative CT. Treated for gastroenteritis. Planned to start [facility] after admission but never picked up.  Again seen in the ED on 2/19 with n/v chest pain. Reported felt like his normal reflux symptoms. EKG with SR/PVCs and negative work up. Last seen in the AHF clinic on 3/19 and reported feeling well. Weight 147. Attempted to be complaint with diet but limited options on food stamps. Continued on Coreg 3.125mg  BID, Bidil 2 tabs BID, Losartan increased to 50mg  BID, amio 200mg  daily and added Protonix 40mg  daily.   States he has been doing very well over the past year. Weight has been stable. Compliant with his medications. Yesterday morning got up and weighted himself. Went back to bed, then got up again to urinate. Sudden onset of chest pain with diaphoresis. Vomited once. Presented to the ED.   Labs showed stable electrolytes, Cr 1.65, Trop neg>>0.39>>0.30. EKG showed SR, then SR with PVCs. Placed on IV nitro and heparin. CTA was negative for PE. Had recurrence of chest pain last evening. Review of telemetry showed TWI. Repeat EKG this  morning with diffuse TWI and prolonged QT >500. No chest pain while on nitro gtt.   Past Medical History:  Diagnosis Date  . Alcoholism ([place])   . Arthritis    hips, L shoulder  . Asthma   . CHF (congestive heart failure) ([place])   . Depression   . Family history of anesthesia complication   . Hyperlipidemia   . Hypertension     Past Surgical History:  Procedure Laterality Date  . RIGHT/LEFT HEART CATH AND CORONARY ANGIOGRAPHY N/A 06/12/2017   Procedure: RIGHT/LEFT HEART CATH AND CORONARY ANGIOGRAPHY;  Surgeon: Jolaine Artist, MD;  Location: [place] [facility];  Service: Cardiovascular;  Laterality: N/A;     Home Medications:  Prior to Admission medications   Medication Sig Start Date End Date Taking? Authorizing Provider  acetaminophen (TYLENOL) 500 MG tablet Take 2 tablets (1,000 mg total) by mouth 3 (three) times daily as needed for headache (pain). 07/28/18  Yes Dhungel, Nishant, MD  albuterol (PROVENTIL HFA;VENTOLIN HFA) 108 (90 Base) MCG/ACT inhaler Inhale 1-2 puffs into the lungs every 4 (four) hours as needed for wheezing or shortness of breath. 02/23/18  Yes Melynda Ripple, MD  albuterol (PROVENTIL) (2.5 MG/3ML) 0.083% nebulizer solution Take 3 mLs (2.5 mg total) by nebulization every 6 (six) hours as needed for wheezing or shortness of breath. 02/23/18  Yes Melynda Ripple, MD  amiodarone (PACERONE) 200 MG tablet Take 1 tablet (200 mg total) by mouth daily. 08/17/18  Yes Shirley Friar, PA-C  amLODipine (NORVASC) 10 MG tablet Take 1 tablet (  10 mg total) by mouth daily. Patient taking differently: Take 5 mg by mouth daily.  07/14/18  Yes Bensimhon, Shaune Pascal, MD  carvedilol (COREG) 3.125 MG tablet TAKE 1 TABLET BY MOUTH TWICE DAILY WITH MEALS Patient taking differently: Take 12.5 mg by mouth 2 (two) times daily with a meal.  07/19/18  Yes Bensimhon, Shaune Pascal, MD  furosemide (LASIX) 20 MG tablet Take 1 tablet (20 mg total) by mouth daily as needed (weight gain).  Weight gain 07/07/18  Yes Bensimhon, Shaune Pascal, MD  isosorbide-hydrALAZINE (BIDIL) 20-37.5 MG tablet Take 2 tablets by mouth 3 (three) times daily.    Yes [provider]  losartan (COZAAR) 25 MG tablet Take 1 tablet (25 mg total) by mouth daily. 09/14/18  Yes Shirley Friar, PA-C  polyethylene glycol powder (GLYCOLAX/MIRALAX) powder Take 17 g by mouth daily as needed for mild constipation.   Yes [provider]  spironolactone (ALDACTONE) 25 MG tablet Take 0.5 tablets (12.5 mg total) by mouth daily. 07/07/18 10/05/18 Yes Bensimhon, Shaune Pascal, MD  dextromethorphan-guaiFENesin [facility] DM) 30-600 MG 12hr tablet Take 1 tablet by mouth 2 (two) times daily. Patient not taking: Reported on 10/03/2018 07/28/18   Dhungel, Flonnie Overman, MD  feeding supplement, ENSURE ENLIVE, (ENSURE ENLIVE) LIQD Take 237 mLs by mouth 2 (two) times daily between meals. Patient not taking: Reported on 10/03/2018 07/28/18   Dhungel, Flonnie Overman, MD  fluticasone (FLONASE) 50 MCG/ACT nasal spray Place 2 sprays into both nostrils daily. Patient not taking: Reported on 10/03/2018 02/23/18   Melynda Ripple, MD  predniSONE (DELTASONE) 20 MG tablet Take 1 tablet (20 mg total) by mouth daily with breakfast. Patient not taking: Reported on 08/17/2018 07/28/18   Louellen Molder, MD  Spacer/Aero-Holding Chambers (AEROCHAMBER PLUS) inhaler Use as instructed 02/23/18   Melynda Ripple, MD    Inpatient Medications: Scheduled Meds: . alum & mag hydroxide-simeth  30 mL Oral Once  . aspirin EC  325 mg Oral Daily  . pantoprazole  40 mg Oral Daily   Continuous Infusions: . heparin 900 Units/hr (10/04/18 0700)  . nitroGLYCERIN 12 mcg/min (10/04/18 0700)   PRN Meds: acetaminophen, albuterol  Allergies:    Allergies  Allergen Reactions  . Lisinopril Swelling    Facial and tongue swelling 10/2012  . Penicillins Other (See Comments)    "Stomach pains" Has patient had a PCN reaction causing immediate rash, facial/tongue/throat  swelling, SOB or lightheadedness with hypotension: Unk Has patient had a PCN reaction causing severe rash involving mucus membranes or skin necrosis: Unk Has patient had a PCN reaction that required hospitalization: Unk Has patient had a PCN reaction occurring within the last 10 years: No If all of the above answers are "NO", then may proceed with Cephalosporin use.     Social History:   Social History   Socioeconomic History  . Marital status: Single    Spouse name: Not on file  . Number of children: Not on file  . Years of education: Not on file  . Highest education level: Not on file  Occupational History  . Occupation: "it don't work for me", on disability  Social Needs  . Financial resource strain: Not on file  . Food insecurity:    Worry: Not on file    Inability: Not on file  . Transportation needs:    Medical: Not on file    Non-medical: Not on file  Tobacco Use  . Smoking status: Former Smoker    Packs/day: 0.10    Years: 45.00  Pack years: 4.50    Last attempt to quit: 2012    Years since quitting: 8.1  . Smokeless tobacco: Never Used  Substance and Sexual Activity  . Alcohol use: No    Frequency: Never    Comment: Pt states no etoh since April 2014  . Drug use: No    Comment: Prior use of crack cocaine, quit 2012  . Sexual activity: Never  Lifestyle  . Physical activity:    Days per week: Not on file    Minutes per session: Not on file  . Stress: Not on file  Relationships  . Social connections:    Talks on phone: Not on file    Gets together: Not on file    Attends religious service: Not on file    Active member of club or organization: Not on file    Attends meetings of clubs or organizations: Not on file    Relationship status: Not on file  . Intimate partner violence:    Fear of current or ex partner: Not on file    Emotionally abused: Not on file    Physically abused: Not on file    Forced sexual activity: Not on file  Other Topics Concern   . Not on file  Social History Narrative  . Not on file    Family History:    Family History  Problem Relation Age of Onset  . Heart disease Father   . Heart disease Daughter        "fluid around heart"      ROS:  Please see the history of present illness.   All other ROS reviewed and negative.     Physical Exam/Data:   Vitals:   10/03/18 2300 10/04/18 0400 10/04/18 0444 10/04/18 0838  BP: 124/69  (!) 101/57 110/67  Pulse: (!) 53 (!) 52 64 (!) 55  Resp: 16 14 18 15   Temp: 97.9 F (36.6 C)  97.7 F (36.5 C) 97.9 F (36.6 C)  TempSrc: Oral  Oral Oral  SpO2: 91% 96% 91% 98%  Weight:      Height:        Intake/Output Summary (Last 24 hours) at 10/04/2018 1158 Last data filed at 10/04/2018 1000 Gross per 24 hour  Intake 1856.18 ml  Output 1525 ml  Net 331.18 ml   Last 3 Weights 10/03/2018 10/03/2018 08/17/2018  Weight (lbs) 149 lb 14.6 oz 145 lb 151 lb 6.4 oz  Weight (kg) 68 kg 65.772 kg 68.675 kg  Some encounter information is confidential and restricted. Go to Review Flowsheets activity to see all data.     Body mass index is 20.91 kg/m.  General:  Well nourished, well developed, in no acute distress HEENT: normal Lymph: no adenopathy Neck: no JVD Endocrine:  No thryomegaly Vascular: No carotid bruits; FA pulses 2+ bilaterally without bruits  Cardiac:  normal S1, S2; RRR; no murmur  Lungs:  clear to auscultation bilaterally, no wheezing, rhonchi or rales  Abd: soft, nontender, no hepatomegaly  Ext: no edema Musculoskeletal:  No deformities, BUE and BLE strength normal and equal Skin: warm and dry  Neuro:  CNs 2-12 intact, no focal abnormalities noted Psych:  Normal affect   EKG:  The EKG was personally reviewed and demonstrates:  SR with diffuse TWI and prolonged QT interval >500.  Telemetry:  Telemetry was personally reviewed and demonstrates:  SR with TWI  Relevant CV Studies:  TTE: pending  Laboratory Data:  Chemistry Recent Labs  Lab 09/28/18 [phone]  10/03/18 0811  NA 138 140  K 4.0 4.1  CL 108 107  CO2 22 21*  GLUCOSE 89 112*  BUN 16 13  CREATININE 1.51* 1.65*  CALCIUM 8.9 9.5  GFRNONAA 48* 43*  GFRAA 55* 50*  ANIONGAP 8 12    No results for input(s): PROT, ALBUMIN, AST, ALT, ALKPHOS, BILITOT in the last 168 hours. Hematology Recent Labs  Lab 10/03/18 0811 10/04/18 0218  WBC 8.5 4.4  RBC 5.01 4.29  HGB 13.6 11.6*  HCT 42.9 35.9*  MCV 85.6 83.7  MCH 27.1 27.0  MCHC 31.7 32.3  RDW 15.5 15.3  PLT 311 211   Cardiac Enzymes Recent Labs  Lab 10/03/18 0910 10/03/18 1839 10/04/18 0218  TROPONINI <0.03 0.39* 0.30*    Recent Labs  Lab 10/03/18 0824  TROPIPOC 0.16*    BNP Recent Labs  Lab 10/03/18 0811  BNP 106.5*    DDimer  Recent Labs  Lab 10/03/18 0914  DDIMER 0.41    Radiology/Studies:  Dg Chest 2 View  Result Date: 10/03/2018 CLINICAL DATA:  Central chest pain. EXAM: CHEST - 2 VIEW COMPARISON:  Chest x-ray dated July 26, 2018. FINDINGS: The heart size and mediastinal contours are within normal limits. Normal pulmonary vascularity. The lungs remain hyperinflated. No focal consolidation, pleural effusion, or pneumothorax. No acute osseous abnormality. IMPRESSION: 1.  No active cardiopulmonary disease. 2. COPD. Electronically Signed   By: Titus Dubin M.D.   On: 10/03/2018 09:05   Ct Angio Chest/abd/pel For Dissection W And/or Wo Contrast  Result Date: 10/03/2018 CLINICAL DATA:  Chest pain since yesterday morning EXAM: CT ANGIOGRAPHY CHEST, ABDOMEN AND PELVIS TECHNIQUE: Multidetector CT imaging through the chest, abdomen and pelvis was performed using the standard protocol during bolus administration of intravenous contrast. Multiplanar reconstructed images and MIPs were obtained and reviewed to evaluate the vascular anatomy. CONTRAST:  176mL OMNIPAQUE IOHEXOL 350 MG/ML SOLN COMPARISON:  Abdomen and pelvis CT 06/17/2017 FINDINGS: CTA CHEST FINDINGS Cardiovascular: Noncontrast phase shows no intramural  hematoma within the aorta. No notable atheromatous calcifications. Postcontrast imaging shows no aortic dissection. There is no aneurysm of the aorta. Well opacified pulmonary arteries that are negative for filling defect. Prominent left ventricular chamber size which may be related to cardiac cycle. Overall there is no cardiomegaly. No pericardial effusion. Mediastinum/Nodes: Negative for adenopathy or mass Lungs/Pleura: Generalized airway thickening. Mild atelectasis or scarring in the right lower lobe. Subtle generalized centrilobular ground-glass nodules in the apical lungs. No cysts are seen. Musculoskeletal: Spondylosis with multi-level bridging osteophyte in the lower thoracic spine. Exaggerated thoracic kyphosis. Review of the MIP images confirms the above findings. CTA ABDOMEN AND PELVIS FINDINGS VASCULAR Aorta: Mild atheromatous changes.  No dissection or aneurysm Celiac: Mild proximal narrowing due to calcified plaque. Smooth and widely patent branches SMA: Smooth and widely patent branches Renals: A tiny right renal artery is present. Left renal artery is smooth and widely patent IMA: Patent Inflow: Mild atherosclerotic plaque.  No aneurysm or dissection. Veins: Negative in the arterial phase Review of the MIP images confirms the above findings. NON-VASCULAR Hepatobiliary: No focal liver abnormality.No evidence of biliary obstruction or stone. Pancreas: Prominent but still within normal limits main pancreatic duct diameter of 2 mm. Spleen: Unremarkable. Adrenals/Urinary Tract: Negative adrenals. Nonenhancing severely atrophic right kidney with peripherally calcified and crenulated mass attributed to remote insult. Mild patchy left renal cortical scarring. Unremarkable bladder. Stomach/Bowel: No obstruction. Appendicoliths without superimposed inflammation. No evidence of bowel inflammation. Lymphatic: No mass or adenopathy. Reproductive:Negative Other: No  ascites or pneumoperitoneum. Musculoskeletal:  Severe bilateral hip osteoarthritis with protrusio deformity on the right. Sclerosis and subchondral cyst formation is extensive. Sacroiliac fusion with productive change suggesting this is osteoarthritic. No acute finding Review of the MIP images confirms the above findings. IMPRESSION: 1. No evidence of acute aortic syndrome. 2. Airway thickening and apical ground-glass nodules which could be infectious bronchitis/bronchiolitis, smoking-related respiratory bronchiolitis, or hypersensitivity pneumonitis. 3. 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 Mild left renal cortical scarring. 4. Mild atherosclerosis of the aorta. 5. Notably severe hip osteoarthritis on both sides. Electronically Signed   By: Monte Fantasia M.D.   On: 10/03/2018 12:41    Assessment and Plan:   Joe Garcia is a 68 y.o. male with a hx of ETOH use, HTN, HL, depression, arthritis and NICM with EF previously 30-35%  who is being seen today for the evaluation of chest pain at the request of Dr. Evette Doffing.  1. Unstable Angina with + troponins: Trop peaked at 0.39. EKG on admission with SR and no TW changes. Had episode of chest pain last evening with now TWI and prolonged QT. Cath in 11/18 with normal coronaries. Etiology unclear at this time. Given troponin elevation and EKG changes will need to undergo cath.  -- The patient understands that risks included but are not limited to stroke (1 in 1000), death (1 in 8), kidney failure [usually temporary] (1 in 500), bleeding (1 in 200), allergic reaction [possibly serious] (1 in 200).  -- IV heparin and nitro gtt  2. Chronic systolic HF: no signs of volume overload on exam.  -- on BB and losartan PTA -- echo pending  3. HTN: agents held while on nitro  For questions or updates, please contact [place] Please consult [email or web address] for contact info under   Signed, Reino Bellis, NP  10/04/2018 11:58 AM   Agree with note by Reino Bellis NP-C  We  are asked to see Joe Garcia for chest pain.  He underwent cardiac catheterization by Dr. [name] November 2018 revealing essentially normal coronary arteries with severe LV dysfunction consistent with a nonischemic cardiomyopathy.  He was admitted with chest pain.  His enzymes are minimally elevated.  Chest pain improved with IV nitroglycerin.  He does have new anterolateral T wave inversion.  I am concerned that this is an ischemic manifestation.  Despite the relative recent clean cath I feel he deserves repeat cardiac catheterization.  His exam is benign.  Lorretta Harp, M.D., [place], [facility], [place] [address] [place], [place]  [address]  [phone] 10/04/2018 1:09 PM

## 2018-10-04 NOTE — Progress Notes (Addendum)
   Subjective: Overnight patient presented with exertional substernal chest pain radiating to left arm with associated diaphoresis. No EKG from this episode. Relieved with SL nitro. Repeat troponin 0.39->0.30. He was started on heparin.  On rounds this morning he reports he is feeling well. He is not currently having chest pain. He reports he got out of bed yesterday and was walking in his room when he had another episode of chest pain. He said the episode lasted about an hour and the nitroglycerin helped alleviate the pain. He states he is able to ambulate long distance without chest pain or SOB. He has not worked in many years due to hip problems. All questions and concerns addressed.    Objective:  Vital signs in last 24 hours: Vitals:   10/03/18 2000 10/03/18 2300 10/04/18 0400 10/04/18 0444  BP:  124/69  (!) 101/57  Pulse: (!) 51 (!) 53 (!) 52 64  Resp: 14 16 14 18   Temp:  97.9 F (36.6 C)  97.7 F (36.5 C)  TempSrc:  Oral  Oral  SpO2: 94% 91% 96% 91%  Weight:      Height:       Physical Exam Gen: seen comfortably laying in bed, no distress CV: RRR, no murmurs Abdomen: soft, non-distended, non-tender  Ext: no edema   Assessment/Plan:  Active Problems:   Chest pain  Mr. Daichi Moris is a 68 yo male with hypertension, hyperlipidemia, and HFrEF presenting with an acute onset of substernal chest pain with associated diaphoresis and vomiting. EKG illustrated ventricular bigeminy, no ST segment changes or elevations. Heart score 4. He has some risk factors including HTN, history of tobacco use and family history of heart disease. He had a right/left heart cath and coronary angiography in 05/2017 that did not show any CAD, NICM EF 25-30% and frequent PVC's for which he takes amiodarone. History of alcohol and cocaine use per chart review.   Chest pain: Overnight patient had exertional substernal chest pain radiating to left arm with associated diaphoresis. Relieved with SL nitro.  Repeat troponin 0.39->0.30. He was started on heparin. Cardiology consulted this am.  - Cardiology consulted, appreciate recommendations  - Continue IV heparin  - Cardiac monitoring - Follow up echocardiogram  - Continue nitroglycerin  - Lipid panel wnl - Continue pantoprazole 40 mg   HFrEF:  - Follow up echocardiogram  - Continue carvedilol 12.5 mg BID   HTN:  - Continue carvedilol 12.5 mg BID   Dispo: Anticipated discharge pending cardiology consult and clinical improvement.   [name], DO 10/04/2018, 7:57 AM Pager: [phone]

## 2018-10-04 NOTE — Interval H&P Note (Signed)
Cath Lab Visit (complete for each Cath Lab visit)  Clinical Evaluation Leading to the Procedure:   ACS: Yes.    Non-ACS:    Anginal Classification: CCS IV  Anti-ischemic medical therapy: Minimal Therapy (1 class of medications)  Non-Invasive Test Results: No non-invasive testing performed  Prior CABG: No previous CABG      History and Physical Interval Note:  10/04/2018 3:03 PM  Joe Garcia  has presented today for surgery, with the diagnosis of angina.  The various methods of treatment have been discussed with the patient and family. After consideration of risks, benefits and other options for treatment, the patient has consented to  Procedure(s): LEFT HEART CATH AND CORONARY ANGIOGRAPHY (N/A) as a surgical intervention.  The patient's history has been reviewed, patient examined, no change in status, stable for surgery.  I have reviewed the patient's chart and labs.  Questions were answered to the patient's satisfaction.     Larae Grooms

## 2018-10-04 NOTE — Plan of Care (Signed)
  Problem: Health Behavior/Discharge Planning: Goal: Ability to manage health-related needs will improve Outcome: Progressing   Problem: Coping: Goal: Level of anxiety will decrease Outcome: Progressing   Pt completed ECHO today, Cardiology consulted. Currently in cath lab. VSS. No CP per pt, on NTG infusing, heparin gtt per pharmacy, pre-cath saline infusing. Daughter, Roselyn Reef, notified of procedure. Awaiting pt return.

## 2018-10-04 NOTE — Consult Note (Addendum)
Cardiology Consultation:   Patient ID: [name] MRN: [id number]; DOB: 1950-08-26  Admit date: 10/03/2018 Date of Consult: 10/04/2018  Primary Care Provider: Medicine, [facility] Primary Cardiologist: No primary care provider on file.  Primary Electrophysiologist:  None    Patient Profile:   Joe Garcia is a 68 y.o. male with a hx of ETOH use, HTN, HL, depression, arthritis and NICM with EF previously 30-35%  who is being seen today for the evaluation of chest pain at the request of Dr. Evette Doffing.  History of Present Illness:   Joe Garcia with PMH of hx of ETOH use, HTN, HL, depression, arthritis and NICM with EF previously 30-35%. He is followed in the AHF clinic. Underwent cardiac cath in 11/18 with normal coronaries and compensated HF. Freq PVCs noted and placed on amiodarone. Re-admitted shortly afterwards with abd pain, with negative CT. Treated for gastroenteritis. Planned to start [facility] after admission but never picked up.  Again seen in the ED on 2/19 with n/v chest pain. Reported felt like his normal reflux symptoms. EKG with SR/PVCs and negative work up. Last seen in the AHF clinic on 3/19 and reported feeling well. Weight 147. Attempted to be complaint with diet but limited options on food stamps. Continued on Coreg 3.125mg  BID, Bidil 2 tabs BID, Losartan increased to 50mg  BID, amio 200mg  daily and added Protonix 40mg  daily.   States he has been doing very well over the past year. Weight has been stable. Compliant with his medications. Yesterday morning got up and weighted himself. Went back to bed, then got up again to urinate. Sudden onset of chest pain with diaphoresis. Vomited once. Presented to the ED.   Labs showed stable electrolytes, Cr 1.65, Trop neg>>0.39>>0.30. EKG showed SR, then SR with PVCs. Placed on IV nitro and heparin. CTA was negative for PE. Had recurrence of chest pain last evening. Review of telemetry showed TWI. Repeat EKG this  morning with diffuse TWI and prolonged QT >500. No chest pain while on nitro gtt.   Past Medical History:  Diagnosis Date  . Alcoholism ([place])   . Arthritis    hips, L shoulder  . Asthma   . CHF (congestive heart failure) ([place])   . Depression   . Family history of anesthesia complication   . Hyperlipidemia   . Hypertension     Past Surgical History:  Procedure Laterality Date  . RIGHT/LEFT HEART CATH AND CORONARY ANGIOGRAPHY N/A 06/12/2017   Procedure: RIGHT/LEFT HEART CATH AND CORONARY ANGIOGRAPHY;  Surgeon: Jolaine Artist, MD;  Location: [place] [facility];  Service: Cardiovascular;  Laterality: N/A;     Home Medications:  Prior to Admission medications   Medication Sig Start Date End Date Taking? Authorizing Provider  acetaminophen (TYLENOL) 500 MG tablet Take 2 tablets (1,000 mg total) by mouth 3 (three) times daily as needed for headache (pain). 07/28/18  Yes Dhungel, Nishant, MD  albuterol (PROVENTIL HFA;VENTOLIN HFA) 108 (90 Base) MCG/ACT inhaler Inhale 1-2 puffs into the lungs every 4 (four) hours as needed for wheezing or shortness of breath. 02/23/18  Yes Melynda Ripple, MD  albuterol (PROVENTIL) (2.5 MG/3ML) 0.083% nebulizer solution Take 3 mLs (2.5 mg total) by nebulization every 6 (six) hours as needed for wheezing or shortness of breath. 02/23/18  Yes Melynda Ripple, MD  amiodarone (PACERONE) 200 MG tablet Take 1 tablet (200 mg total) by mouth daily. 08/17/18  Yes Shirley Friar, PA-C  amLODipine (NORVASC) 10 MG tablet Take 1 tablet (  10 mg total) by mouth daily. Patient taking differently: Take 5 mg by mouth daily.  07/14/18  Yes Bensimhon, Shaune Pascal, MD  carvedilol (COREG) 3.125 MG tablet TAKE 1 TABLET BY MOUTH TWICE DAILY WITH MEALS Patient taking differently: Take 12.5 mg by mouth 2 (two) times daily with a meal.  07/19/18  Yes Bensimhon, Shaune Pascal, MD  furosemide (LASIX) 20 MG tablet Take 1 tablet (20 mg total) by mouth daily as needed (weight gain).  Weight gain 07/07/18  Yes Bensimhon, Shaune Pascal, MD  isosorbide-hydrALAZINE (BIDIL) 20-37.5 MG tablet Take 2 tablets by mouth 3 (three) times daily.    Yes [provider]  losartan (COZAAR) 25 MG tablet Take 1 tablet (25 mg total) by mouth daily. 09/14/18  Yes Shirley Friar, PA-C  polyethylene glycol powder (GLYCOLAX/MIRALAX) powder Take 17 g by mouth daily as needed for mild constipation.   Yes [provider]  spironolactone (ALDACTONE) 25 MG tablet Take 0.5 tablets (12.5 mg total) by mouth daily. 07/07/18 10/05/18 Yes Bensimhon, Shaune Pascal, MD  dextromethorphan-guaiFENesin [facility] DM) 30-600 MG 12hr tablet Take 1 tablet by mouth 2 (two) times daily. Patient not taking: Reported on 10/03/2018 07/28/18   Dhungel, Flonnie Overman, MD  feeding supplement, ENSURE ENLIVE, (ENSURE ENLIVE) LIQD Take 237 mLs by mouth 2 (two) times daily between meals. Patient not taking: Reported on 10/03/2018 07/28/18   Dhungel, Flonnie Overman, MD  fluticasone (FLONASE) 50 MCG/ACT nasal spray Place 2 sprays into both nostrils daily. Patient not taking: Reported on 10/03/2018 02/23/18   Melynda Ripple, MD  predniSONE (DELTASONE) 20 MG tablet Take 1 tablet (20 mg total) by mouth daily with breakfast. Patient not taking: Reported on 08/17/2018 07/28/18   Louellen Molder, MD  Spacer/Aero-Holding Chambers (AEROCHAMBER PLUS) inhaler Use as instructed 02/23/18   Melynda Ripple, MD    Inpatient Medications: Scheduled Meds: . alum & mag hydroxide-simeth  30 mL Oral Once  . aspirin EC  325 mg Oral Daily  . pantoprazole  40 mg Oral Daily   Continuous Infusions: . heparin 900 Units/hr (10/04/18 0700)  . nitroGLYCERIN 12 mcg/min (10/04/18 0700)   PRN Meds: acetaminophen, albuterol  Allergies:    Allergies  Allergen Reactions  . Lisinopril Swelling    Facial and tongue swelling 10/2012  . Penicillins Other (See Comments)    "Stomach pains" Has patient had a PCN reaction causing immediate rash, facial/tongue/throat  swelling, SOB or lightheadedness with hypotension: Unk Has patient had a PCN reaction causing severe rash involving mucus membranes or skin necrosis: Unk Has patient had a PCN reaction that required hospitalization: Unk Has patient had a PCN reaction occurring within the last 10 years: No If all of the above answers are "NO", then may proceed with Cephalosporin use.     Social History:   Social History   Socioeconomic History  . Marital status: Single    Spouse name: Not on file  . Number of children: Not on file  . Years of education: Not on file  . Highest education level: Not on file  Occupational History  . Occupation: "it don't work for me", on disability  Social Needs  . Financial resource strain: Not on file  . Food insecurity:    Worry: Not on file    Inability: Not on file  . Transportation needs:    Medical: Not on file    Non-medical: Not on file  Tobacco Use  . Smoking status: Former Smoker    Packs/day: 0.10    Years: 45.00  Pack years: 4.50    Last attempt to quit: 2012    Years since quitting: 8.1  . Smokeless tobacco: Never Used  Substance and Sexual Activity  . Alcohol use: No    Frequency: Never    Comment: Pt states no etoh since April 2014  . Drug use: No    Comment: Prior use of crack cocaine, quit 2012  . Sexual activity: Never  Lifestyle  . Physical activity:    Days per week: Not on file    Minutes per session: Not on file  . Stress: Not on file  Relationships  . Social connections:    Talks on phone: Not on file    Gets together: Not on file    Attends religious service: Not on file    Active member of club or organization: Not on file    Attends meetings of clubs or organizations: Not on file    Relationship status: Not on file  . Intimate partner violence:    Fear of current or ex partner: Not on file    Emotionally abused: Not on file    Physically abused: Not on file    Forced sexual activity: Not on file  Other Topics Concern   . Not on file  Social History Narrative  . Not on file    Family History:    Family History  Problem Relation Age of Onset  . Heart disease Father   . Heart disease Daughter        "fluid around heart"      ROS:  Please see the history of present illness.   All other ROS reviewed and negative.     Physical Exam/Data:   Vitals:   10/03/18 2300 10/04/18 0400 10/04/18 0444 10/04/18 0838  BP: 124/69  (!) 101/57 110/67  Pulse: (!) 53 (!) 52 64 (!) 55  Resp: 16 14 18 15   Temp: 97.9 F (36.6 C)  97.7 F (36.5 C) 97.9 F (36.6 C)  TempSrc: Oral  Oral Oral  SpO2: 91% 96% 91% 98%  Weight:      Height:        Intake/Output Summary (Last 24 hours) at 10/04/2018 1158 Last data filed at 10/04/2018 1000 Gross per 24 hour  Intake 1856.18 ml  Output 1525 ml  Net 331.18 ml   Last 3 Weights 10/03/2018 10/03/2018 08/17/2018  Weight (lbs) 149 lb 14.6 oz 145 lb 151 lb 6.4 oz  Weight (kg) 68 kg 65.772 kg 68.675 kg  Some encounter information is confidential and restricted. Go to Review Flowsheets activity to see all data.     Body mass index is 20.91 kg/m.  General:  Well nourished, well developed, in no acute distress HEENT: normal Lymph: no adenopathy Neck: no JVD Endocrine:  No thryomegaly Vascular: No carotid bruits; FA pulses 2+ bilaterally without bruits  Cardiac:  normal S1, S2; RRR; no murmur  Lungs:  clear to auscultation bilaterally, no wheezing, rhonchi or rales  Abd: soft, nontender, no hepatomegaly  Ext: no edema Musculoskeletal:  No deformities, BUE and BLE strength normal and equal Skin: warm and dry  Neuro:  CNs 2-12 intact, no focal abnormalities noted Psych:  Normal affect   EKG:  The EKG was personally reviewed and demonstrates:  SR with diffuse TWI and prolonged QT interval >500.  Telemetry:  Telemetry was personally reviewed and demonstrates:  SR with TWI  Relevant CV Studies:  TTE: pending  Laboratory Data:  Chemistry Recent Labs  Lab 09/28/18 [id number]  10/03/18 0811  NA 138 140  K 4.0 4.1  CL 108 107  CO2 22 21*  GLUCOSE 89 112*  BUN 16 13  CREATININE 1.51* 1.65*  CALCIUM 8.9 9.5  GFRNONAA 48* 43*  GFRAA 55* 50*  ANIONGAP 8 12    No results for input(s): PROT, ALBUMIN, AST, ALT, ALKPHOS, BILITOT in the last 168 hours. Hematology Recent Labs  Lab 10/03/18 0811 10/04/18 0218  WBC 8.5 4.4  RBC 5.01 4.29  HGB 13.6 11.6*  HCT 42.9 35.9*  MCV 85.6 83.7  MCH 27.1 27.0  MCHC 31.7 32.3  RDW 15.5 15.3  PLT 311 211   Cardiac Enzymes Recent Labs  Lab 10/03/18 0910 10/03/18 1839 10/04/18 0218  TROPONINI <0.03 0.39* 0.30*    Recent Labs  Lab 10/03/18 0824  TROPIPOC 0.16*    BNP Recent Labs  Lab 10/03/18 0811  BNP 106.5*    DDimer  Recent Labs  Lab 10/03/18 0914  DDIMER 0.41    Radiology/Studies:  Dg Chest 2 View  Result Date: 10/03/2018 CLINICAL DATA:  Central chest pain. EXAM: CHEST - 2 VIEW COMPARISON:  Chest x-ray dated July 26, 2018. FINDINGS: The heart size and mediastinal contours are within normal limits. Normal pulmonary vascularity. The lungs remain hyperinflated. No focal consolidation, pleural effusion, or pneumothorax. No acute osseous abnormality. IMPRESSION: 1.  No active cardiopulmonary disease. 2. COPD. Electronically Signed   By: Titus Dubin M.D.   On: 10/03/2018 09:05   Ct Angio Chest/abd/pel For Dissection W And/or Wo Contrast  Result Date: 10/03/2018 CLINICAL DATA:  Chest pain since yesterday morning EXAM: CT ANGIOGRAPHY CHEST, ABDOMEN AND PELVIS TECHNIQUE: Multidetector CT imaging through the chest, abdomen and pelvis was performed using the standard protocol during bolus administration of intravenous contrast. Multiplanar reconstructed images and MIPs were obtained and reviewed to evaluate the vascular anatomy. CONTRAST:  163mL OMNIPAQUE IOHEXOL 350 MG/ML SOLN COMPARISON:  Abdomen and pelvis CT 06/17/2017 FINDINGS: CTA CHEST FINDINGS Cardiovascular: Noncontrast phase shows no intramural  hematoma within the aorta. No notable atheromatous calcifications. Postcontrast imaging shows no aortic dissection. There is no aneurysm of the aorta. Well opacified pulmonary arteries that are negative for filling defect. Prominent left ventricular chamber size which may be related to cardiac cycle. Overall there is no cardiomegaly. No pericardial effusion. Mediastinum/Nodes: Negative for adenopathy or mass Lungs/Pleura: Generalized airway thickening. Mild atelectasis or scarring in the right lower lobe. Subtle generalized centrilobular ground-glass nodules in the apical lungs. No cysts are seen. Musculoskeletal: Spondylosis with multi-level bridging osteophyte in the lower thoracic spine. Exaggerated thoracic kyphosis. Review of the MIP images confirms the above findings. CTA ABDOMEN AND PELVIS FINDINGS VASCULAR Aorta: Mild atheromatous changes.  No dissection or aneurysm Celiac: Mild proximal narrowing due to calcified plaque. Smooth and widely patent branches SMA: Smooth and widely patent branches Renals: A tiny right renal artery is present. Left renal artery is smooth and widely patent IMA: Patent Inflow: Mild atherosclerotic plaque.  No aneurysm or dissection. Veins: Negative in the arterial phase Review of the MIP images confirms the above findings. NON-VASCULAR Hepatobiliary: No focal liver abnormality.No evidence of biliary obstruction or stone. Pancreas: Prominent but still within normal limits main pancreatic duct diameter of 2 mm. Spleen: Unremarkable. Adrenals/Urinary Tract: Negative adrenals. Nonenhancing severely atrophic right kidney with peripherally calcified and crenulated mass attributed to remote insult. Mild patchy left renal cortical scarring. Unremarkable bladder. Stomach/Bowel: No obstruction. Appendicoliths without superimposed inflammation. No evidence of bowel inflammation. Lymphatic: No mass or adenopathy. Reproductive:Negative Other: No  ascites or pneumoperitoneum. Musculoskeletal:  Severe bilateral hip osteoarthritis with protrusio deformity on the right. Sclerosis and subchondral cyst formation is extensive. Sacroiliac fusion with productive change suggesting this is osteoarthritic. No acute finding Review of the MIP images confirms the above findings. IMPRESSION: 1. No evidence of acute aortic syndrome. 2. Airway thickening and apical ground-glass nodules which could be infectious bronchitis/bronchiolitis, smoking-related respiratory bronchiolitis, or hypersensitivity pneumonitis. 3. 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 Mild left renal cortical scarring. 4. Mild atherosclerosis of the aorta. 5. Notably severe hip osteoarthritis on both sides. Electronically Signed   By: Monte Fantasia M.D.   On: 10/03/2018 12:41    Assessment and Plan:   REVIN CORKER is a 68 y.o. male with a hx of ETOH use, HTN, HL, depression, arthritis and NICM with EF previously 30-35%  who is being seen today for the evaluation of chest pain at the request of Dr. Evette Doffing.  1. Unstable Angina with + troponins: Trop peaked at 0.39. EKG on admission with SR and no TW changes. Had episode of chest pain last evening with now TWI and prolonged QT. Cath in 11/18 with normal coronaries. Etiology unclear at this time. Given troponin elevation and EKG changes will need to undergo cath.  -- The patient understands that risks included but are not limited to stroke (1 in 1000), death (1 in 67), kidney failure [usually temporary] (1 in 500), bleeding (1 in 200), allergic reaction [possibly serious] (1 in 200).  -- IV heparin and nitro gtt  2. Chronic systolic HF: no signs of volume overload on exam.  -- on BB and losartan PTA -- echo pending  3. HTN: agents held while on nitro  For questions or updates, please contact [place] Please consult [email or web address] for contact info under   Signed, Reino Bellis, NP  10/04/2018 11:58 AM   Agree with note by Reino Bellis NP-C  We  are asked to see Joe Garcia for chest pain.  He underwent cardiac catheterization by Dr. [name] November 2018 revealing essentially normal coronary arteries with severe LV dysfunction consistent with a nonischemic cardiomyopathy.  He was admitted with chest pain.  His enzymes are minimally elevated.  Chest pain improved with IV nitroglycerin.  He does have new anterolateral T wave inversion.  I am concerned that this is an ischemic manifestation.  Despite the relative recent clean cath I feel he deserves repeat cardiac catheterization.  His exam is benign.  Lorretta Harp, M.D., [place], [facility], [place] [address] [place], [place]  [id number]  [phone] 10/04/2018 1:09 PM

## 2018-10-04 NOTE — Progress Notes (Signed)
[place] for Heparin Indication: chest pain/ACS  Allergies  Allergen Reactions  . Lisinopril Swelling    Facial and tongue swelling 10/2012  . Penicillins Other (See Comments)    "Stomach pains" Has patient had a PCN reaction causing immediate rash, facial/tongue/throat swelling, SOB or lightheadedness with hypotension: Unk Has patient had a PCN reaction causing severe rash involving mucus membranes or skin necrosis: Unk Has patient had a PCN reaction that required hospitalization: Unk Has patient had a PCN reaction occurring within the last 10 years: No If all of the above answers are "NO", then may proceed with Cephalosporin use.     Patient Measurements: Height: 5\' 11"  (180.3 cm) Weight: 149 lb 14.6 oz (68 kg) IBW/kg (Calculated) : 75.3IBW 75.3kg Last known weight: 73.2 kg  Heparin Dosing Weight: 73.2 kg  Vital Signs: Temp: 97.9 F (36.6 C) (03/08 2300) Temp Source: Oral (03/08 2300) BP: 124/69 (03/08 2300) Pulse Rate: 53 (03/08 2300)  Labs: Recent Labs    10/03/18 0811 10/03/18 0910 10/03/18 1839 10/04/18 0218  HGB 13.6  --   --  11.6*  HCT 42.9  --   --  35.9*  PLT 311  --   --  211  HEPARINUNFRC  --   --   --  0.86*  CREATININE 1.65*  --   --   --   TROPONINI  --  <0.03 0.39* 0.30*    Estimated Creatinine Clearance: 42.9 mL/min (A) (by C-G formula based on SCr of 1.65 mg/dL (H)).   Medical History: Past Medical History:  Diagnosis Date  . Alcoholism ([place])   . Arthritis    hips, L shoulder  . Asthma   . CHF (congestive heart failure) ([place])   . Depression   . Family history of anesthesia complication   . Hyperlipidemia   . Hypertension     Assessment: 77 yom presenting with chest pain. Has hx of CHF due to NICM, HTN, HLD, Asthma, and solitary kidney. Former crack cocaine abuse. GERD, COPD, HF, EF 09/30/17: 40-45%  Troponin now increasing 0.16>0.39. Hgb 13.6, plt 311. Received 4000 units received 0938 and  enoxaparin at 1712. No s/sx of bleeding.   3/9 AM update: heparin level elevated, no issues per RN.   Goal of Therapy:  Heparin level 0.3-0.7 units/ml Monitor platelets by anticoagulation protocol: Yes   Plan:  Dec heparin to 900 units/hr Re-check heparin level in 8 hours  Narda Bonds, PharmD, [place] Pharmacist Phone: [phone]

## 2018-10-04 NOTE — Progress Notes (Signed)
[place] for Heparin Indication: chest pain/ACS  Allergies  Allergen Reactions  . Lisinopril Swelling    Facial and tongue swelling 10/2012  . Penicillins Other (See Comments)    "Stomach pains" Has patient had a PCN reaction causing immediate rash, facial/tongue/throat swelling, SOB or lightheadedness with hypotension: Unk Has patient had a PCN reaction causing severe rash involving mucus membranes or skin necrosis: Unk Has patient had a PCN reaction that required hospitalization: Unk Has patient had a PCN reaction occurring within the last 10 years: No If all of the above answers are "NO", then may proceed with Cephalosporin use.     Patient Measurements: Height: 5\' 11"  (180.3 cm) Weight: 149 lb 14.6 oz (68 kg) IBW/kg (Calculated) : 75.3IBW 75.3kg Last known weight: 73.2 kg  Heparin Dosing Weight: 73.2 kg  Vital Signs: Temp: 97.9 F (36.6 C) (03/09 0838) Temp Source: Oral (03/09 0838) BP: 110/67 (03/09 0838) Pulse Rate: 55 (03/09 0838)  Labs: Recent Labs    10/03/18 0811 10/03/18 0910 10/03/18 1839 10/04/18 0218  HGB 13.6  --   --  11.6*  HCT 42.9  --   --  35.9*  PLT 311  --   --  211  HEPARINUNFRC  --   --   --  0.86*  CREATININE 1.65*  --   --   --   TROPONINI  --  <0.03 0.39* 0.30*    Estimated Creatinine Clearance: 42.9 mL/min (A) (by C-G formula based on SCr of 1.65 mg/dL (H)).   Medical History: Past Medical History:  Diagnosis Date  . Alcoholism ([place])   . Arthritis    hips, L shoulder  . Asthma   . CHF (congestive heart failure) ([place])   . Depression   . Family history of anesthesia complication   . Hyperlipidemia   . Hypertension     Assessment: 39 yom presenting with chest pain. Has hx of CHF due to NICM, HTN, HLD, Asthma, and solitary kidney. Former crack cocaine abuse. GERD, COPD, HF, EF 09/30/17: 40-45%  Heparin level still elevated this afternoon at 0.77. No bleeding issues noted.   Goal of  Therapy:  Heparin level 0.3-0.7 units/ml Monitor platelets by anticoagulation protocol: Yes   Plan:  Decrease heparin to 800 units/hr Recheck in am  Erin Hearing PharmD., BCPS Clinical Pharmacist 10/04/2018 11:08 AM

## 2018-10-05 ENCOUNTER — Institutional Professional Consult (permissible substitution): Payer: Self-pay | Admitting: Emergency Medicine

## 2018-10-05 ENCOUNTER — Encounter (HOSPITAL_COMMUNITY): Payer: Self-pay | Admitting: Interventional Cardiology

## 2018-10-05 DIAGNOSIS — I5042 Chronic combined systolic (congestive) and diastolic (congestive) heart failure: Secondary | ICD-10-CM

## 2018-10-05 DIAGNOSIS — I214 Non-ST elevation (NSTEMI) myocardial infarction: Principal | ICD-10-CM

## 2018-10-05 DIAGNOSIS — I5181 Takotsubo syndrome: Secondary | ICD-10-CM

## 2018-10-05 DIAGNOSIS — I428 Other cardiomyopathies: Secondary | ICD-10-CM

## 2018-10-05 LAB — CBC
HCT: 34.9 % — ABNORMAL LOW (ref 39.0–52.0)
Hemoglobin: 11.4 g/dL — ABNORMAL LOW (ref 13.0–17.0)
MCH: 27.5 pg (ref 26.0–34.0)
MCHC: 32.7 g/dL (ref 30.0–36.0)
MCV: 84.1 fL (ref 80.0–100.0)
Platelets: 222 10*3/uL (ref 150–400)
RBC: 4.15 MIL/uL — ABNORMAL LOW (ref 4.22–5.81)
RDW: 15.4 % (ref 11.5–15.5)
WBC: 4.8 10*3/uL (ref 4.0–10.5)
nRBC: 0 % (ref 0.0–0.2)

## 2018-10-05 MED ORDER — LOSARTAN POTASSIUM 25 MG PO TABS
25.0000 mg | ORAL_TABLET | Freq: Every day | ORAL | Status: DC
  Administered 2018-10-05: 25 mg via ORAL
  Filled 2018-10-05: qty 1

## 2018-10-05 NOTE — Discharge Summary (Addendum)
Name: DEMONTREZ Garcia MRN: [id number] DOB: 1950/09/23 68 y.o. PCP: Medicine, [facility]  Date of Admission: 10/03/2018  8:07 AM Date of Discharge: 3/10/20203/04/2019 Attending Physician: Lalla Brothers MD  Discharge Diagnosis: 1. NSTEMI 2. Chronic combined systolic and diastolic heart failure   Discharge Medications: Allergies as of 10/05/2018      Reactions   Lisinopril Swelling   Facial and tongue swelling 10/2012   Penicillins Other (See Comments)   "Stomach pains" Has patient had a PCN reaction causing immediate rash, facial/tongue/throat swelling, SOB or lightheadedness with hypotension: Unk Has patient had a PCN reaction causing severe rash involving mucus membranes or skin necrosis: Unk Has patient had a PCN reaction that required hospitalization: Unk Has patient had a PCN reaction occurring within the last 10 years: No If all of the above answers are "NO", then may proceed with Cephalosporin use.      Medication List    TAKE these medications   acetaminophen 500 MG tablet Commonly known as:  TYLENOL Take 2 tablets (1,000 mg total) by mouth 3 (three) times daily as needed for headache (pain).   AeroChamber Plus inhaler Use as instructed   albuterol 108 (90 Base) MCG/ACT inhaler Commonly known as:  PROVENTIL HFA;VENTOLIN HFA Inhale 1-2 puffs into the lungs every 4 (four) hours as needed for wheezing or shortness of breath.   albuterol (2.5 MG/3ML) 0.083% nebulizer solution Commonly known as:  PROVENTIL Take 3 mLs (2.5 mg total) by nebulization every 6 (six) hours as needed for wheezing or shortness of breath.   amiodarone 200 MG tablet Commonly known as:  PACERONE Take 1 tablet (200 mg total) by mouth daily.   amLODipine 10 MG tablet Commonly known as:  NORVASC Take 1 tablet (10 mg total) by mouth daily. What changed:  how much to take   carvedilol 3.125 MG tablet Commonly known as:  COREG TAKE 1 TABLET BY MOUTH TWICE DAILY WITH  MEALS What changed:  how much to take   dextromethorphan-guaiFENesin 30-600 MG 12hr tablet Commonly known as:  MUCINEX DM Take 1 tablet by mouth 2 (two) times daily.   feeding supplement (ENSURE ENLIVE) Liqd Take 237 mLs by mouth 2 (two) times daily between meals.   fluticasone 50 MCG/ACT nasal spray Commonly known as:  FLONASE Place 2 sprays into both nostrils daily.   furosemide 20 MG tablet Commonly known as:  LASIX Take 1 tablet (20 mg total) by mouth daily as needed (weight gain). Weight gain   isosorbide-hydrALAZINE 20-37.5 MG tablet Commonly known as:  BIDIL Take 2 tablets by mouth 3 (three) times daily.   losartan 25 MG tablet Commonly known as:  COZAAR Take 1 tablet (25 mg total) by mouth daily.   polyethylene glycol powder powder Commonly known as:  GLYCOLAX/MIRALAX Take 17 g by mouth daily as needed for mild constipation.   predniSONE 20 MG tablet Commonly known as:  DELTASONE Take 1 tablet (20 mg total) by mouth daily with breakfast.   spironolactone 25 MG tablet Commonly known as:  ALDACTONE Take 0.5 tablets (12.5 mg total) by mouth daily.       Disposition and follow-up:   Joe Garcia was discharged from [facility] in Stable condition.  At the hospital follow up visit please address:  1.  Chest pain- please assess if patient is continuing to have chest pain and continue to take medications as prescribed  2.  Labs / imaging needed at time of follow-up: none  3.  Pending labs/ test needing follow-up: none  Follow-up Appointments: Follow-up Information    Bensimhon, Shaune Pascal, MD. Schedule an appointment as soon as possible for a visit in 2 week(s).   Specialty:  Cardiology Contact information: [place] [address] [phone]           Hospital Course by problem list: 1. NSTEMI- Patient presented with an acute onset ofsubsternalchest painwith associated diaphoresis and vomiting.EKG  illustrated ventricular bigeminy, no ST segment changes or elevations. Troponin's peaked at 0.39. Heart score4. He has some risk factors including HTN,history of tobacco useand family history of heart disease. He had a right/left heart cath and coronary angiography in 05/2017 that did not show any CAD.Left heart cath and coronary angiography on this admission showed a low LV end diastolic pressure, no AV stenosis and no significant CAD. Echocardiogram showed an EF of 30-35%, impaired relaxation and Takotsubo cardiomyopathy. Cardiology recommended no changes in medical management of nonischemic cardiomyopathy and LV dysfunction. To follow up with cardiology outpatient 2. Chronic combined systolic and diastolic heart failure - see above   Discharge Vitals:   BP (!) 141/68 (BP Location: Left Arm)   Pulse (!) 55   Temp 98.3 F (36.8 C) (Oral)   Resp 12   Ht 5\' 11"  (1.803 m)   Wt 68 kg   SpO2 100%   BMI 20.91 kg/m   Pertinent Labs, Studies, and Procedures:   troponins Results for Joe Garcia (MRN [id number]) as of 10/05/2018 15:12  Ref. Range 10/03/2018 18:39 10/04/2018 02:18  Troponin I Latest Ref Range: <0.03 ng/mL 0.39 (HH) 0.30 (HH)    2D echocardiogram (10/04/2018)  IMPRESSIONS   1. The left ventricle has moderate-severely reduced systolic function, with an ejection fraction of 30-35%. The cavity size was mildly dilated. Left ventricular diastolic Doppler parameters are consistent with impaired relaxation. There was akinesis of  the true apex and the peri-apical segments. This is consistent with Takotsubo cardiomyopathy (echo today with no significant CAD). 2. The right ventricle has normal systolic function. The cavity was normal. There is no increase in right ventricular wall thickness. 3. Left atrial size was mildly dilated. 4. The aortic valve is tricuspid Mild calcification of the aortic valve. Aortic valve regurgitation is trivial by color flow Doppler. no stenosis of  the aortic valve. 5. The aortic root and ascending aorta are normal in size and structure. 6. Mild mitral regurgitation. 7. The IVC was normal in size. No complete TR doppler jet so unable to estimate PA systolic pressure.   Cardiac catheterization (10/04/2018)   LV end diastolic pressure is low.  There is no aortic valve stenosis.  No significant CAD.  Discharge Instructions: Discharge Instructions    Diet - low sodium heart healthy   Complete by:  As directed    Discharge instructions   Complete by:  As directed    Joe Garcia, Joe Garcia were hospitalized due to chest pain. Your cardiac catheterization did not show any coronary artery disease or blockages in your heart vessels. You are on very good medications to help manage your heart failure and hypertension. I want you to continue your medications as prescribed and follow up with Cardiology in 1-2 weeks.   If you continue to have indigestion try taking Zantac over the counter.  Thank you for allowing [place] to be a part of you care!   Increase activity slowly   Complete by:  As directed       Signed:  Rehman, Areeg N, DO 10/05/2018, 3:07 PM

## 2018-10-05 NOTE — Progress Notes (Signed)
   Subjective: [name] reported feeling well this morning. He states he is having intermittent episodes of chest pain which he relates to gas. He states they normally have occurred after eating. He was informed of his cath and echo results. All questions and concerns addressed.   Objective:  Vital signs in last 24 hours: Vitals:   10/05/18 0100 10/05/18 0300 10/05/18 0400 10/05/18 0500  BP: 120/62 119/61 119/61 129/72  Pulse: (!) 55 (!) 50 (!) 52 (!) 51  Resp:  12    Temp:  98.2 F (36.8 C)    TempSrc:  Oral    SpO2: 99% 99% 100% 98%  Weight:      Height:       Physical Exam Gen: seen comfortably laying in bed, no distress CV: RRR, no murmurs Ext: no edema, warm and well perfused  Abdomen: soft, non-distended, no ttp  Assessment/Plan:  Principal Problem:   NSTEMI (non-ST elevated myocardial infarction) (HCC) Active Problems:   Chronic combined systolic (congestive) and diastolic (congestive) heart failure (HCC)   CKD (chronic kidney disease), stage II   Chest pain  [name] is a 67 yo male with hypertension, hyperlipidemia, and HFrEF presenting with an acute onset of substernal chest pain with associated diaphoresis and vomiting. EKG illustrated ventricular bigeminy, no ST segment changes or elevations. Heart score4. He has some risk factors including HTN,history of tobacco useand family history of heart disease. He had a right/left heart cath and coronary angiography in 05/2017 that did not show any CAD, NICM EF 25-30% and frequent PVC's for which he takes amiodarone. History of alcohol and cocaine use per chart review.  Chest pain:  - Left heart cath and coronary angiography showed a low LV end diastolic pressure, no AV stenosis and no significant CAD. - Echocardiogram showed an EF of 30-35%, impaired relaxation and Takotsubo cardiomyopathy  - Cards recommends no changes in medical management of nonischemic cardiomyopathy and LV dysfunction. To follow up  with cardiology outpatient - Appreciate cardiology recommendations  - Continue pantoprazole 40 mg  HFrEF:  - Echo as above  - Continue carvedilol 12.5 mg BID  HTN:  - Resume home medications at discharge    Dispo: Patient is medically stable for discharge today.  [name], DO 10/05/2018, 6:40 AM Pager: [phone]

## 2018-10-05 NOTE — Progress Notes (Signed)
Progress Note  Patient Name: Joe Garcia Date of Encounter: 10/05/2018  Primary Cardiologist: Dr. [name]  Subjective   Postop day 1 cardiac catheterization.  Patient had clean coronary arteries with "Takatsubo " syndrome appearing left ventricle.  He is asymptomatic.  Inpatient Medications    Scheduled Meds: . alum & mag hydroxide-simeth  30 mL Oral Once  . aspirin EC  325 mg Oral Daily  . enoxaparin (LOVENOX) injection  40 mg Subcutaneous Q24H  . losartan  25 mg Oral Daily  . pantoprazole  40 mg Oral Daily  . sodium chloride flush  3 mL Intravenous Q12H   Continuous Infusions: . sodium chloride     PRN Meds: sodium chloride, acetaminophen, albuterol, ondansetron (ZOFRAN) IV, sodium chloride flush   Vital Signs    Vitals:   10/05/18 0300 10/05/18 0400 10/05/18 0500 10/05/18 0748  BP: 119/61 119/61 129/72 126/65  Pulse: (!) 50 (!) 52 (!) 51   Resp: 12     Temp: 98.2 F (36.8 C)   97.9 F (36.6 C)  TempSrc: Oral   Oral  SpO2: 99% 100% 98%   Weight:      Height:        Intake/Output Summary (Last 24 hours) at 10/05/2018 0953 Last data filed at 10/05/2018 0754 Gross per 24 hour  Intake 1591.29 ml  Output 1650 ml  Net -58.71 ml   Last 3 Weights 10/03/2018 10/03/2018 08/17/2018  Weight (lbs) 149 lb 14.6 oz 145 lb 151 lb 6.4 oz  Weight (kg) 68 kg 65.772 kg 68.675 kg  Some encounter information is confidential and restricted. Go to Review Flowsheets activity to see all data.      Telemetry    Sinus rhythm/sinus bradycardia- Personally Reviewed  ECG    Sinus bradycardia with left bundle branch block- Personally Reviewed  Physical Exam   GEN: No acute distress.   Neck: No JVD Cardiac: RRR, no murmurs, rubs, or gallops.  Respiratory: Clear to auscultation bilaterally. GI: Soft, nontender, non-distended  MS: No edema; No deformity. Neuro:  Nonfocal  Psych: Normal affect   Labs    Chemistry Recent Labs  Lab 09/28/18 0953 10/03/18 0811  10/04/18 0218  NA 138 140 136  K 4.0 4.1 3.8  CL 108 107 108  CO2 22 21* 19*  GLUCOSE 89 112* 133*  BUN 16 13 12   CREATININE 1.51* 1.65* 1.40*  CALCIUM 8.9 9.5 8.5*  GFRNONAA 48* 43* 52*  GFRAA 55* 50* >60  ANIONGAP 8 12 9      Hematology Recent Labs  Lab 10/03/18 0811 10/04/18 0218 10/05/18 0233  WBC 8.5 4.4 4.8  RBC 5.01 4.29 4.15*  HGB 13.6 11.6* 11.4*  HCT 42.9 35.9* 34.9*  MCV 85.6 83.7 84.1  MCH 27.1 27.0 27.5  MCHC 31.7 32.3 32.7  RDW 15.5 15.3 15.4  PLT 311 211 222    Cardiac Enzymes Recent Labs  Lab 10/03/18 0910 10/03/18 1839 10/04/18 0218  TROPONINI <0.03 0.39* 0.30*    Recent Labs  Lab 10/03/18 0824  TROPIPOC 0.16*     BNP Recent Labs  Lab 10/03/18 0811  BNP 106.5*     DDimer  Recent Labs  Lab 10/03/18 0914  DDIMER 0.41     Radiology    Ct Angio Chest/abd/pel For Dissection W And/or Wo Contrast  Result Date: 10/03/2018 CLINICAL DATA:  Chest pain since yesterday morning EXAM: CT ANGIOGRAPHY CHEST, ABDOMEN AND PELVIS TECHNIQUE: Multidetector CT imaging through the chest, abdomen and pelvis was performed using  the standard protocol during bolus administration of intravenous contrast. Multiplanar reconstructed images and MIPs were obtained and reviewed to evaluate the vascular anatomy. CONTRAST:  143mL OMNIPAQUE IOHEXOL 350 MG/ML SOLN COMPARISON:  Abdomen and pelvis CT 06/17/2017 FINDINGS: CTA CHEST FINDINGS Cardiovascular: Noncontrast phase shows no intramural hematoma within the aorta. No notable atheromatous calcifications. Postcontrast imaging shows no aortic dissection. There is no aneurysm of the aorta. Well opacified pulmonary arteries that are negative for filling defect. Prominent left ventricular chamber size which may be related to cardiac cycle. Overall there is no cardiomegaly. No pericardial effusion. Mediastinum/Nodes: Negative for adenopathy or mass Lungs/Pleura: Generalized airway thickening. Mild atelectasis or scarring in the  right lower lobe. Subtle generalized centrilobular ground-glass nodules in the apical lungs. No cysts are seen. Musculoskeletal: Spondylosis with multi-level bridging osteophyte in the lower thoracic spine. Exaggerated thoracic kyphosis. Review of the MIP images confirms the above findings. CTA ABDOMEN AND PELVIS FINDINGS VASCULAR Aorta: Mild atheromatous changes.  No dissection or aneurysm Celiac: Mild proximal narrowing due to calcified plaque. Smooth and widely patent branches SMA: Smooth and widely patent branches Renals: A tiny right renal artery is present. Left renal artery is smooth and widely patent IMA: Patent Inflow: Mild atherosclerotic plaque.  No aneurysm or dissection. Veins: Negative in the arterial phase Review of the MIP images confirms the above findings. NON-VASCULAR Hepatobiliary: No focal liver abnormality.No evidence of biliary obstruction or stone. Pancreas: Prominent but still within normal limits main pancreatic duct diameter of 2 mm. Spleen: Unremarkable. Adrenals/Urinary Tract: Negative adrenals. Nonenhancing severely atrophic right kidney with peripherally calcified and crenulated mass attributed to remote insult. Mild patchy left renal cortical scarring. Unremarkable bladder. Stomach/Bowel: No obstruction. Appendicoliths without superimposed inflammation. No evidence of bowel inflammation. Lymphatic: No mass or adenopathy. Reproductive:Negative Other: No ascites or pneumoperitoneum. 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 Sclerosis and subchondral cyst formation is extensive. Sacroiliac fusion with productive change suggesting this is osteoarthritic. No acute finding Review of the MIP images confirms the above findings. IMPRESSION: 1. No evidence of acute aortic syndrome. 2. Airway thickening and apical ground-glass nodules which could be infectious bronchitis/bronchiolitis, smoking-related respiratory bronchiolitis, or  hypersensitivity pneumonitis. 3. 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 Mild left renal cortical scarring. 4. Mild atherosclerosis of the aorta. 5. Notably severe hip osteoarthritis on both sides. Electronically Signed   By: Monte Fantasia M.D.   On: 10/03/2018 12:41    Cardiac Studies   2D echocardiogram (10/04/2018)  IMPRESSIONS    1. The left ventricle has moderate-severely reduced systolic function, with an ejection fraction of 30-35%. The cavity size was mildly dilated. Left ventricular diastolic Doppler parameters are consistent with impaired relaxation. There was akinesis of  the true apex and the peri-apical segments. This is consistent with Takotsubo cardiomyopathy (echo today with no significant CAD).  2. The right ventricle has normal systolic function. The cavity was normal. There is no increase in right ventricular wall thickness.  3. Left atrial size was mildly dilated.  4. The aortic valve is tricuspid Mild calcification of the aortic valve. Aortic valve regurgitation is trivial by color flow Doppler. no stenosis of the aortic valve.  5. The aortic root and ascending aorta are normal in size and structure.  6. Mild mitral regurgitation.  7. The IVC was normal in size. No complete TR doppler jet so unable to estimate PA systolic pressure.   Cardiac catheterization (10/04/2018)   LV end diastolic pressure is low.  There is  no aortic valve stenosis.  No significant CAD.   Medical therapy for nonischemic cardiomyopathy, LV dysfunction.  Patient Profile     Joe Garcia is a 67 y.o. male with a hx of ETOH use, HTN, HL, depression, arthritis and NICM with EF previously 30-35%  who is being seen today for the evaluation of chest pain at the request of Dr. Evette Doffing.  He underwent cardiac catheterization yesterday by Dr. Irish Lack revealing normal coronary arteries with an LV that appeared characteristic of "Takatsubo syndrome".  Assessment & Plan    1:  Unstable angina- troponins mildly elevated but coronary arteries clean by cardiac catheterization.  I suspect he had a small enzyme leak related to his cardiomyopathy.  2: Chronic systolic heart failure- EF 35% with LV consistent with "takatsubo syndrome".  3: Essential hypertension- controlled on current medications   [facility] will sign off.   Medication Recommendations: No change in current medications Other recommendations (labs, testing, etc):   Follow up as an outpatient: Follow-up with Dr. [name]  For questions or updates, please contact [place] Please consult [email or web address] for contact info under        Signed, Quay Burow, MD  10/05/2018, 9:53 AM

## 2018-10-05 NOTE — Progress Notes (Signed)
Pt left unit in wheelchair  acompanied by nurse tech,discharge documenrts completed.

## 2018-11-03 ENCOUNTER — Ambulatory Visit: Payer: Self-pay | Admitting: *Deleted

## 2018-11-03 NOTE — Telephone Encounter (Addendum)
Patient left voicemail message on COVID line stating that has experienced chills and has been using the bathroom a lot. Pt states he wants to know more about Corona because he is a heart patient and has a history of asthma.  Returned call to pt who states that he has been experiencing chills, diarrhea, sore throat for the past 3 days. Pt states that he has experienced headaches since Sunday. Pt states he has not checked his temperature but states his daughter is supposed to be bringing a thermometer to his home. Pt states he has not had any recent travel and has not been around anyone confirmed to have the corona virus. Pt states he has been around his daughter recently and has went to a friend's house recently.  Patient given home care advice and advised to also contact PCP, which he states is on [place]. Pt states he did try to contact PCP previously but was unable to get in touch with anyone. Pt advised to self isolate at home and if worsening symptoms develop to make PCP aware and seek treatment in the ED. Pt verbalized understanding.  Reason for Disposition . [1] Mild body aches, chills, diarrhea, headache, runny nose, or sore throat AND [2] within 14 days of COVID-Exposure  Answer Assessment - Initial Assessment Questions 1. CLOSE CONTACT: "Who is the person with the confirmed or suspected COVID-19 infection that you were exposed to?"     No 2. PLACE of CONTACT: "Where were you when you were exposed to COVID-19?" (e.g., home, school, medical waiting room; which city?)     No 3. TYPE of CONTACT: "How much contact was there?" (e.g., sitting next to, live in same house, work in same office, same building)     In the car with one of his kids and went over someone's house, last on the bus Friday of last week 4. DURATION of CONTACT: "How long were you in contact with the COVID-19 patient?" (e.g., a few seconds, passed by person, a few minutes, live with the patient)     No 5. DATE of  CONTACT: "When did you have contact with a COVID-19 patient?" (e.g., how many days ago)     No 6. TRAVEL: "Have you traveled out of the country recently?" If so, "When and where?"     * Also ask about out-of-state travel, since the CDC has identified some high risk cities for community spread in the [place].     * Note: Travel becomes less relevant if there is widespread community transmission where the patient lives.     No 7. COMMUNITY SPREAD: "Are there lots of cases or COVID-19 (community spread) where you live?" (See public health department website, if unsure)   * MAJOR community spread: high number of cases; numbers of cases are increasing; many people hospitalized.   * MINOR community spread: low number of cases; not increasing; few or no people hospitalized     No but has been around people recently 6. SYMPTOMS: "Do you have any symptoms?" (e.g., fever, cough, breathing difficulty)     Diarrhea for 3 days and it is loose,  9. PREGNANCY OR POSTPARTUM: "Is there any chance you are pregnant?" "When was your last menstrual period?" "Did you deliver in the last 2 weeks?"     n/a 10. HIGH RISK: "Do you have any heart or lung problems? Do you have a weak immune system?" (e.g., CHF, COPD, asthma, HIV positive, chemotherapy, renal failure, diabetes mellitus, sickle cell anemia)  Asthma, Heart is only functioning at 35%, COPD  Protocols used: CORONAVIRUS (COVID-19) EXPOSURE-A-AH

## 2018-11-16 ENCOUNTER — Encounter (HOSPITAL_COMMUNITY): Payer: Self-pay

## 2018-11-16 ENCOUNTER — Ambulatory Visit (HOSPITAL_COMMUNITY)
Admission: RE | Admit: 2018-11-16 | Discharge: 2018-11-16 | Disposition: A | Discharge status: Home / Self Care | Payer: Medicare Other | Source: Ambulatory Visit | Attending: Adult Health | Admitting: Adult Health

## 2018-11-16 ENCOUNTER — Telehealth (HOSPITAL_COMMUNITY): Payer: Self-pay | Admitting: Licensed Clinical Social Worker

## 2018-11-16 ENCOUNTER — Other Ambulatory Visit: Payer: Self-pay

## 2018-11-16 VITALS — Wt 147.0 lb

## 2018-11-16 DIAGNOSIS — Z550 Illiteracy and low-level literacy: Secondary | ICD-10-CM

## 2018-11-16 DIAGNOSIS — I5042 Chronic combined systolic (congestive) and diastolic (congestive) heart failure: Secondary | ICD-10-CM | POA: Diagnosis not present

## 2018-11-16 DIAGNOSIS — J449 Chronic obstructive pulmonary disease, unspecified: Secondary | ICD-10-CM

## 2018-11-16 NOTE — Telephone Encounter (Signed)
CSW received consult to refer patien to the [facility].  Referral complete and sent in to clinic for physician signature.  CSW spoke with pt to confirm new address- [address], [place], [place] [address]  [facility] will continue to follow and assist as needed  [name], [facility] Desk#: [phone] Cell#: [phone]

## 2018-11-16 NOTE — Patient Instructions (Addendum)
You have been referred to our Heart Failure Paramedicine program. Someone will be in contact with you in order to set up appointments.  Follow up next week for virtual visit with Amy. This is scheduled for April 30th at 11:00am

## 2018-11-16 NOTE — Addendum Note (Signed)
Encounter addended by: Marlise Eves, RN on: 11/16/2018 10:09 AM  Actions taken: Clinical Note Signed

## 2018-11-16 NOTE — Progress Notes (Signed)
Heart Failure TeleHealth Note  Due to national recommendations of social distancing due to [name], Audio/video telehealth visit is felt to be most appropriate for this patient at this time.  See MyChart message from today for patient consent regarding telehealth for [facility].  Date:  11/16/2018   ID:  Joe Garcia, DOB Dec 25, 1950, MRN [id number]  Location: Home  Provider location: [place] [facility] Type of Visit: Established patient  PCP:  Medicine, [facility]  Cardiologist:  No primary care provider on file. Primary HF: Dr [name]  Chief Complaint: Heart Failure  History of Present Illness: Joe Garcia is a 68 y.o. 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 Former crack cocaine abuse.   Admitted 11/14 - 06/15/17 with CP and DOE. Received steroids and nebs. BNP minimally elevated on arrival. Troponin negative.  R/LHC with normal cors and compensated hemodynamics. 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   Seen in ED 09/09/17 with N/V and "chest pain". CP felt like his normal reflux. EKG with PVCs and NSR. Had been seen at home by paramedicine and told HR was in 30s, but normal in 60s on monitor. Likely undercounting due to PVCs.  Work up unremarkable.   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   Readmitted with chest pain 10/03/18 with chest pain. Cardiac enzymes minimally elevated but with EKGs he was set up for LHC. 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 ECHO completed and showed EF 30-35% and normal RV function. He was discharged on 10/05/18 with plans for HF follow up.   He presents via Administrator for a telehealth visit today. Overall feeling fine. Remains SOB with exertion. Denies PND/Orthopnea. Appetite ok. No fever or chills. Weight at home 145-147 pounds.  Taking all medications but he is not sure the dose of any of  the medications. Says he does not drink or smoke.     he denies symptoms worrisome for COVID 19.   ECHO 05/06/2017 EF 30-35% Grade IDD Echo 09/30/2017: EF 40-45%, grade 2 DD ECHO 3/20202: EF 30-35% RV normal  Past Medical History:  Diagnosis Date  . Alcoholism ([place])   . Arthritis    hips, L shoulder  . Asthma   . CHF (congestive heart failure) ([place])   . Depression   . Family history of anesthesia complication   . Hyperlipidemia   . Hypertension    Past Surgical History:  Procedure Laterality Date  . LEFT HEART CATH AND CORONARY ANGIOGRAPHY N/A 10/04/2018   Procedure: LEFT HEART CATH AND CORONARY ANGIOGRAPHY;  Surgeon: Jettie Booze, MD;  Location: [place];  Service: Cardiovascular;  Laterality: N/A;  . RIGHT/LEFT HEART CATH AND CORONARY ANGIOGRAPHY N/A 06/12/2017   Procedure: RIGHT/LEFT HEART CATH AND CORONARY ANGIOGRAPHY;  Surgeon: Jolaine Artist, MD;  Location: [place];  Service: Cardiovascular;  Laterality: N/A;     Current Outpatient Medications  Medication Sig Dispense Refill  . acetaminophen (TYLENOL) 500 MG tablet Take 2 tablets (1,000 mg total) by mouth 3 (three) times daily as needed for headache (pain). 30 tablet 0  . albuterol (PROVENTIL HFA;VENTOLIN HFA) 108 (90 Base) MCG/ACT inhaler Inhale 1-2 puffs into the lungs every 4 (four) hours as needed for wheezing or shortness of breath. 1 Inhaler 0  . albuterol (PROVENTIL) (2.5 MG/3ML) 0.083% nebulizer solution Take 3 mLs (2.5 mg  total) by nebulization every 6 (six) hours as needed for wheezing or shortness of breath. 75 mL 0  . amiodarone (PACERONE) 200 MG tablet Take 1 tablet (200 mg total) by mouth daily. 90 tablet 3  . amLODipine (NORVASC) 10 MG tablet Take 1 tablet (10 mg total) by mouth daily. (Patient taking differently: Take 5 mg by mouth daily. ) 30 tablet 5  . carvedilol (COREG) 3.125 MG tablet TAKE 1 TABLET BY MOUTH TWICE DAILY WITH MEALS (Patient taking differently: Take 12.5 mg by  mouth 2 (two) times daily with a meal. ) 60 tablet 5  . dextromethorphan-guaiFENesin (MUCINEX DM) 30-600 MG 12hr tablet Take 1 tablet by mouth 2 (two) times daily. (Patient not taking: Reported on 10/03/2018) 10 tablet 0  . feeding supplement, ENSURE ENLIVE, (ENSURE ENLIVE) LIQD Take 237 mLs by mouth 2 (two) times daily between meals. (Patient not taking: Reported on 10/03/2018) 237 mL 12  . fluticasone (FLONASE) 50 MCG/ACT nasal spray Place 2 sprays into both nostrils daily. (Patient not taking: Reported on 10/03/2018) 16 g 0  . furosemide (LASIX) 20 MG tablet Take 1 tablet (20 mg total) by mouth daily as needed (weight gain). Weight gain 30 tablet 5  . isosorbide-hydrALAZINE (BIDIL) 20-37.5 MG tablet Take 2 tablets by mouth 3 (three) times daily.     Marland Kitchen losartan (COZAAR) 25 MG tablet Take 1 tablet (25 mg total) by mouth daily. 30 tablet 6  . polyethylene glycol powder (GLYCOLAX/MIRALAX) powder Take 17 g by mouth daily as needed for mild constipation.    . predniSONE (DELTASONE) 20 MG tablet Take 1 tablet (20 mg total) by mouth daily with breakfast. (Patient not taking: Reported on 08/17/2018) 16 tablet 0  . Spacer/Aero-Holding Chambers (AEROCHAMBER PLUS) inhaler Use as instructed 1 each 2  . spironolactone (ALDACTONE) 25 MG tablet Take 0.5 tablets (12.5 mg total) by mouth daily. 15 tablet 3   No current facility-administered medications for this encounter.     Allergies:   Lisinopril and Penicillins   Social History:  The patient  reports that he quit smoking about 8 years ago. He has a 4.50 pack-year smoking history. He has never used smokeless tobacco. He reports that he does not drink alcohol or use drugs.   Family History:  The patient's family history includes Heart disease in his daughter and father.   ROS:  Please see the history of present illness.   All other systems are personally reviewed and negative.   Exam:  Tele Health Call; Exam is subjective  General:  Speaks in full sentences. No  resp difficulty. Lungs: Normal respiratory effort with conversation.  Abdomen: Non-distended per patient report Extremities: Pt denies edema. Neuro: Alert & oriented x 3.   Recent Labs: 12/05/2017: ALT 10 10/03/2018: B Natriuretic Peptide 106.5 10/04/2018: BUN 12; Creatinine, Ser 1.40; Potassium 3.8; Sodium 136 10/05/2018: Hemoglobin 11.4; Platelets 222  Personally reviewed   Wt Readings from Last 3 Encounters:  10/03/18 68 kg (149 lb 14.6 oz)  08/17/18 68.7 kg (151 lb 6.4 oz)  07/27/18 73.2 kg (161 lb 6.4 oz)      ASSESSMENT AND PLAN:  1.  Chronic Systolic Heart Failure NICM. 09/2018 LHC no CAD. ECHO 09/2018 EF 30-35%.  NYHA IIIb. Chronically short of breath. Volume status sounds stable.  Continue current medications because I am not really sure what he is taking.   In the past he was followed by HF Paramedics and had graduated from the program but he needs to be followed again.  Refer again today and set up HF visit next week with HF Paramedicine.  2. Former Smoker  Quit 7 years ago.  3. COPD  Inhalers per PCP 4. Low literacy.  Barrier to medication management. HF Paramedicine will be able to help with this.     COVID screen The patient does not have any symptoms that suggest any further testing/ screening at this time.  Social distancing reinforced today.  Patient Risk: After full review of this patients clinical status, I feel that they are at moderate risk for cardiac decompensation at this time.  Relevant cardiac medications were reviewed at length with the patient today. The patient does not have concerns regarding their medications at this time.   The following changes were made today: See above f Recommended follow-up:  Follow up next week for HF follow up with me.  I will plan to verify all medications with HF Paramedic.   Today, I have spent 15  minutes with the patient with telehealth technology discussing the above issues .    Jeanmarie Hubert, NP  11/16/2018  8:46 AM  [place] [address] [phone] (office) [phone] (fax)

## 2018-11-23 ENCOUNTER — Other Ambulatory Visit: Payer: Self-pay

## 2018-11-23 ENCOUNTER — Ambulatory Visit (INDEPENDENT_AMBULATORY_CARE_PROVIDER_SITE_OTHER): Payer: Medicaid Other

## 2018-11-23 ENCOUNTER — Encounter: Payer: Self-pay | Admitting: Internal Medicine

## 2018-11-23 ENCOUNTER — Ambulatory Visit (INDEPENDENT_AMBULATORY_CARE_PROVIDER_SITE_OTHER): Payer: Medicaid Other | Admitting: Internal Medicine

## 2018-11-23 DIAGNOSIS — R0609 Other forms of dyspnea: Secondary | ICD-10-CM

## 2018-11-23 DIAGNOSIS — J449 Chronic obstructive pulmonary disease, unspecified: Secondary | ICD-10-CM | POA: Diagnosis not present

## 2018-11-23 LAB — BASIC METABOLIC PANEL
BUN: 17 mg/dL (ref 6–23)
CO2: 25 mEq/L (ref 19–32)
Calcium: 8.9 mg/dL (ref 8.4–10.5)
Chloride: 104 mEq/L (ref 96–112)
Creatinine, Ser: 1.52 mg/dL — ABNORMAL HIGH (ref 0.40–1.50)
GFR: 55.89 mL/min — ABNORMAL LOW (ref >60.00)
Glucose, Bld: 84 mg/dL (ref 70–99)
Potassium: 4.5 mEq/L (ref 3.5–5.1)
Sodium: 138 mEq/L (ref 135–145)

## 2018-11-23 LAB — CBC WITH DIFFERENTIAL/PLATELET
Basophils Absolute: 0.1 10*3/uL (ref 0.0–0.1)
Basophils Relative: 1 % (ref 0.0–3.0)
Eosinophils Absolute: 0.4 10*3/uL (ref 0.0–0.7)
Eosinophils Relative: 7.7 % — ABNORMAL HIGH (ref 0.0–5.0)
HCT: 39.4 % (ref 39.0–52.0)
Hemoglobin: 12.9 g/dL — ABNORMAL LOW (ref 13.0–17.0)
Lymphocytes Relative: 37.5 % (ref 12.0–46.0)
Lymphs Abs: 2.1 10*3/uL (ref 0.7–4.0)
MCHC: 32.9 g/dL (ref 30.0–36.0)
MCV: 86.4 fl (ref 78.0–100.0)
Monocytes Absolute: 0.3 10*3/uL (ref 0.1–1.0)
Monocytes Relative: 6.2 % (ref 3.0–12.0)
Neutro Abs: 2.6 10*3/uL (ref 1.4–7.7)
Neutrophils Relative %: 47.6 % (ref 43.0–77.0)
Platelets: 289 10*3/uL (ref 150.0–400.0)
RBC: 4.56 Mil/uL (ref 4.22–5.81)
RDW: 15.2 % (ref 11.5–15.5)
WBC: 5.5 10*3/uL (ref 4.0–10.5)

## 2018-11-23 LAB — TSH: TSH: 1.08 u[IU]/mL (ref 0.35–4.50)

## 2018-11-23 LAB — SEDIMENTATION RATE: Sed Rate: 12 mm/hr (ref 0–20)

## 2018-11-23 MED ORDER — BUDESONIDE-FORMOTEROL FUMARATE 160-4.5 MCG/ACT IN AERO: 2.0000 | INHALATION_SPRAY | Freq: Two times a day (BID) | RESPIRATORY_TRACT | 11 refills | Status: AC

## 2018-11-23 MED ORDER — PREDNISONE 10 MG PO TABS: ORAL_TABLET | ORAL | 0 refills | Status: DC

## 2018-11-23 MED ORDER — BUDESONIDE-FORMOTEROL FUMARATE 160-4.5 MCG/ACT IN AERO: 2.0000 | INHALATION_SPRAY | Freq: Two times a day (BID) | RESPIRATORY_TRACT | 0 refills | Status: DC

## 2018-11-23 NOTE — Progress Notes (Signed)
Joe Garcia, male    DOB: 21-Nov-1950, 68 y.o.   MRN: [id number]   Brief patient profile:  67 yobm quit smoking 2012  With working dx of copd /chf with progressive doe to point where = MMRC3 = can't walk 100 yards even at a slow pace at a flat grade s stopping due to  Sob referred to pulmonary clinic 11/23/2018 by the chf clinic s/p admit:   Date of Admission: 10/03/2018  8:07 AM Date of Discharge: 3/10/20203/04/2019      Hospital Course by problem list: 1. NSTEMI- Patient presented with an acute onset ofsubsternalchest painwith associated diaphoresis and vomiting.EKG illustrated ventricular bigeminy, no ST segment changes or elevations. Troponin's peaked at 0.39. Heart score4. He has some risk factors including HTN,history of tobacco useand family history of heart disease. He had a right/left heart cath and coronary angiography in 05/2017 that did not show any CAD.Left heart cath and coronary angiography on this admission showeda low LV end diastolic pressure, no AV stenosis and no significant CAD. Echocardiogram showedan EF of 30-35%, impaired relaxation and Takotsubo cardiomyopathy. Cardiology recommended no changes in medical management of nonischemic cardiomyopathy and LV dysfunction. To follow up with cardiology outpatient 2. Chronic combined systolic and diastolic heart failure - see above      2D echocardiogram (10/04/2018)  IMPRESSIONS   1. The left ventricle has moderate-severely reduced systolic function, with an ejection fraction of 30-35%. The cavity size was mildly dilated. Left ventricular diastolic Doppler parameters are consistent with impaired relaxation. There was akinesis of  the true apex and the peri-apical segments. This is consistent with Takotsubo cardiomyopathy (echo today with no significant CAD). 2. The right ventricle has normal systolic function. The cavity was normal. There is no increase in right ventricular wall thickness. 3. Left  atrial size was mildly dilated. 4. The aortic valve is tricuspid Mild calcification of the aortic valve. Aortic valve regurgitation is trivial by color flow Doppler. no stenosis of the aortic valve. 5. The aortic root and ascending aorta are normal in size and structure. 6. Mild mitral regurgitation. 7. The IVC was normal in size. No complete TR doppler jet so unable to estimate PA systolic pressure.   Cardiac catheterization (10/04/2018)   LV end diastolic pressure is low.  There is no aortic valve stenosis.  No significant CAD.  Discharge Instructions:     Discharge Instructions    Diet - low sodium heart healthy   Complete by:  As directed    Discharge instructions   Complete by:  As directed    Mr. Jahn, Franchini were hospitalized due to chest pain. Your cardiac catheterization did not show any coronary artery disease or blockages in your heart vessels. You are on very good medications to help manage your heart failure and hypertension. I want you to continue your medications as prescribed and follow up with Cardiology in 1-2 weeks.   If you continue to have indigestion try taking Zantac over the counter.  Thank you for allowing [place] to be a part of you care!   Increase activity slowly   Complete by:  As directed              History of Present Illness  11/23/2018  Pulmonary/ 1st office eval/Kruti Horacek  Chief Complaint  Patient presents with  . Pulmonary Consult    Referred by [facility] for eval of COPD.  Pt c/o increased SOB over the past year. He gets winded walking  to his mailbox or walking up a hill.   Dyspnea:  Can walk about a block to convenience store flat surface then uses saba and easier walking back.  Cough: no Sleep:  Can't sleep on back x years / two pillows on side  SABA use: 6-7 x day hfa/  Neb bid  No obvious day to day or daytime variability or assoc excess/ purulent sputum or mucus plugs or hemoptysis or cp or chest  tightness, subjective wheeze or overt sinus or hb symptoms.     Also denies any obvious fluctuation of symptoms with weather or environmental changes or other aggravating or alleviating factors except as outlined above   No unusual exposure hx or h/o childhood pna/ asthma or knowledge of premature birth.  Current Allergies, Complete Past Medical History, Past Surgical History, Family History, and Social History were reviewed in [employer] record.  ROS  The following are not active complaints unless bolded Hoarseness, sore throat, dysphagia, dental problems, itching, sneezing,  nasal congestion or discharge of excess mucus or purulent secretions, ear ache,   fever, chills, sweats, unintended wt loss or wt gain, classically pleuritic or exertional cp,  orthopnea pnd or arm/hand swelling  or leg swelling, presyncope, palpitations, abdominal pain, anorexia, nausea, vomiting, diarrhea  or change in bowel habits or change in bladder habits, change in stools or change in urine, dysuria, hematuria,  rash, arthralgias, visual complaints, headache, numbness, weakness or ataxia or problems with walking or coordination,  change in mood or  memory.            Past Medical History:  Diagnosis Date  . Alcoholism ([place])   . Arthritis    hips, L shoulder  . Asthma   . CHF (congestive heart failure) ([place])   . Depression   . Family history of anesthesia complication   . Hyperlipidemia   . Hypertension     Outpatient Medications Prior to Visit  Medication Sig Dispense Refill  . acetaminophen (TYLENOL) 500 MG tablet Take 2 tablets (1,000 mg total) by mouth 3 (three) times daily as needed for headache (pain). 30 tablet 0  . albuterol (PROVENTIL HFA;VENTOLIN HFA) 108 (90 Base) MCG/ACT inhaler Inhale 1-2 puffs into the lungs every 4 (four) hours as needed for wheezing or shortness of breath. 1 Inhaler 0  . albuterol (PROVENTIL) (2.5 MG/3ML) 0.083% nebulizer solution Take 3 mLs (2.5 mg  total) by nebulization every 6 (six) hours as needed for wheezing or shortness of breath. 75 mL 0  . amiodarone (PACERONE) 200 MG tablet Take 1 tablet (200 mg total) by mouth daily. 90 tablet 3  . amLODipine (NORVASC) 10 MG tablet Take 1 tablet (10 mg total) by mouth daily. (Patient taking differently: Take 5 mg by mouth daily. ) 30 tablet 5  . carvedilol (COREG) 3.125 MG tablet TAKE 1 TABLET BY MOUTH TWICE DAILY WITH MEALS 60 tablet 5  . fluticasone (FLONASE) 50 MCG/ACT nasal spray Place 2 sprays into both nostrils daily. 16 g 0  . furosemide (LASIX) 20 MG tablet Take 1 tablet (20 mg total) by mouth daily as needed (weight gain). Weight gain 30 tablet 5  . loratadine (CLARITIN) 10 MG tablet Take 10 mg by mouth daily.    Marland Kitchen losartan (COZAAR) 25 MG tablet Take 1 tablet (25 mg total) by mouth daily. 30 tablet 6  . polyethylene glycol powder (GLYCOLAX/MIRALAX) powder Take 17 g by mouth daily as needed for mild constipation.    Marland Kitchen spironolactone (ALDACTONE) 25 MG  tablet Take 0.5 tablets (12.5 mg total) by mouth daily. 15 tablet 3  . dextromethorphan-guaiFENesin (MUCINEX DM) 30-600 MG 12hr tablet Take 1 tablet by mouth 2 (two) times daily. (Patient not taking: Reported on 11/23/2018) 10 tablet 0  . feeding supplement, ENSURE ENLIVE, (ENSURE ENLIVE) LIQD Take 237 mLs by mouth 2 (two) times daily between meals. 237 mL 12  . Spacer/Aero-Holding Chambers (AEROCHAMBER PLUS) inhaler Use as instructed 1 each 2      Objective:     BP 122/76 (BP Location: Left Arm, Cuff Size: Normal)   Pulse (!) 58   Temp 97.9 F (36.6 C) (Oral)   Ht _0  (1.803 m)   Wt 150 lb 9.6 oz (68.3 kg)   SpO2 98%   BMI 21.00 kg/m   SpO2: 98 %  RA  Poor dentition  Upper airway wheeze    HEENT Poor dentition / nl oropharynx. Nl external ear canals without cough reflex -  Mild bilateral non-specific turbinate edema     NECK :  without JVD/Nodes/TM/ nl carotid upstrokes bilaterally   LUNGS: no acc muscle use,  Mod  barrel  contour chest wall with bilateral  Distant bs with mostly upper airway wheezes and  without cough on insp or exp maneuver and mod  Hyperresonant  to  percussion bilaterally     CV:  RRR  no s3 or murmur or increase in P2, and no edema   ABD:  soft and nontender with pos mid insp Hoover's  in the supine position. No bruits or organomegaly appreciated, bowel sounds nl  MS:   Nl gait/  ext warm without deformities, calf tenderness, cyanosis or clubbing No obvious joint restrictions   SKIN: warm and dry without lesions    NEURO:  alert, approp, nl sensorium with  no motor or cerebellar deficits apparent.       CXR PA and Lateral:   11/23/2018 :    I personally reviewed images and agree with radiology impression as follows:    No acute cardiopulmonary disease. COPD.  Labs ordered/ reviewed:      Chemistry      Component Value Date/Time   NA 138 11/23/2018 1421   K 4.5 11/23/2018 1421   CL 104 11/23/2018 1421   CO2 25 11/23/2018 1421   BUN 17 11/23/2018 1421   CREATININE 1.52 (H) 11/23/2018 1421      Component Value Date/Time   CALCIUM 8.9 11/23/2018 1421   ALKPHOS 71 12/05/2017 1645   AST 13 (L) 12/05/2017 1645   ALT 10 (L) 12/05/2017 1645   BILITOT 0.5 12/05/2017 1645        Lab Results  Component Value Date   WBC 5.5 11/23/2018   HGB 12.9 (L) 11/23/2018   HCT 39.4 11/23/2018   MCV 86.4 11/23/2018   PLT 289.0 11/23/2018       EOS                                                              0.4                                    11/23/2018       Lab Results  Component  Value Date   TSH 1.08 11/23/2018          Lab Results  Component Value Date   ESRSEDRATE 12 11/23/2018   ESRSEDRATE 6 11/16/2007          Assessment   DOE (dyspnea on exertion) Onset 2012   DDX of  difficult airways management almost all start with A and  include Adherence, Ace Inhibitors, Acid Reflux, Active Sinus Disease, Alpha 1 Antitripsin deficiency, Anxiety masquerading  as Airways dz,  ABPA,  Allergy(esp in young), Aspiration (esp in elderly), Adverse effects of meds,  Active smoking or vaping, A bunch of PE's (a small clot burden can't cause this syndrome unless there is already severe underlying pulm or vascular dz with poor reserve) plus two Bs  = Bronchiectasis and Beta blocker use..and one C= CHF   Adherence is always the initial "prime suspect" and is a multilayered concern that requires a "trust but verify" approach in every patient - starting with knowing how to use medications, especially inhalers, correctly, keeping up with refills and understanding the fundamental difference between maintenance and prns vs those medications only taken for a very short course and then stopped and not refilled.  - see copd/ hfa training - return with all meds in hand using a trust but verify approach to confirm accurate Medication  Reconciliation The principal here is that until we are certain that the  patients are doing what we've asked, it makes no sense to ask them to do more.   ?Allergy/ asthma > note eos over 0.3 strongly suggest >>> rx  Prednisone 10 mg take  4 each am x 2 days,   2 each am x 2 days,  1 each am x 2 days and stop and start symb 160 2bid   ? Adverse drug effects > way over using saba and now on amiodarone needs ongoing surveillance for toxicity though low esr and cxr encouraging - Patients typically have been on amiodarone for 6-12 months before this complication manifests.  Of note, serial clinical evaluation for symptoms such as cough dyspnea or fevers is  the preferred method of monitoring for pulmonary toxicity because a decrease in DLCO or lung volumes is a nonspecific for toxicity. Pathologically amiodarone pulmonary toxicity may appear as interstitial pneumonitis, eosinophilic pneumonia, organizing pneumonia, pulmonary fibrosis or less commonly as diffuse alveolar hemorrhage, pulmonary nodules or pleural effusions.  Risk factors for pulmonary  toxicity include age greater than 38, daily dose greater than equal to 400 mg, a high cumulative dose, or pre-existing lung disease>>   So he has at least 2 risk factors  ? Acid (or non-acid) GERD > always difficult to exclude as up to 75% of pts in some series report no assoc GI/ Heartburn symptoms> rec  diet restrictions/ reviewed and instructions given but hold off gerd rx for now and just treat as siimple copd/AB and see if upper airway wheeze abate.  Alpha one AT def > very rare in AA's  ? BB effects > unlikely with such low doses of coreg   ? CHF > rx per cards     COPD GOLD ?  Quit smoking 2012  - 11/23/2018  After extensive coaching inhaler device,  effectiveness =    75% from a baseline of 25%     When respiratory symptoms begin or become refractory well after a patient reports complete smoking cessation,  Especially when this wasn't the case while they were smoking, a red flag is raised based on  the work of Dr Kris Mouton which states:  if you quit smoking when your best day FEV1 is still well preserved it is highly unlikely you will progress to severe disease.  That is to say, once the smoking stops,  the symptoms should not suddenly erupt or markedly worsen.  If so, the differential diagnosis should include  obesity/deconditioning,  LPR/Reflux/Aspiration syndromes,  occult CHF, or  especially side effect of medications commonly used in this population.     Since he's never had pfts hard to know how bad he was in 2012 but clearly has AB component that needs to be addressed first and may have also had cardiac asthma element as well but this appears to have resolved by most recent w/u    >>> rec symb 160 2bid/pred x 6 days and reduce saba if possible    return for pfts before samples run out    Total time devoted to counseling  > 50 % of initial 60 min office visit:  review case with pt/ device teaching which extended face to face time for this visit /discussion of  options/alternatives/ personally creating written customized instructions  in presence of pt  then going over those specific  Instructions directly with the pt including how to use all of the meds but in particular covering each new medication in detail and the difference between the maintenance= "automatic" meds and the prns using an action plan format for the latter (If this problem/symptom => do that organization reading Left to right).  Please see AVS from this visit for a full list of these instructions which I personally wrote for this pt and  are unique to this visit.      Christinia Gully, MD 11/23/2018

## 2018-11-23 NOTE — Patient Instructions (Addendum)
Plan A = Automatic = Symbicort 160 Take 2 puffs first thing in am and then another 2 puffs about 12 hours later.   Prednisone 10 mg take  4 each am x 2 days,   2 each am x 2 days,  1 each am x 2 days and stop    Plan B = Backup Only use your albuterol inhaler as a rescue medication to be used if you can't catch your breath by resting or doing a relaxed purse lip breathing pattern.  - The less you use it, the better it will work when you need it. - Ok to use the inhaler up to 2 puffs  every 4 hours if you must but call for appointment if use goes up over your usual need - Don't leave home without it !!  (think of it like the spare tire for your car)    Plan C = Crisis - only use your albuterol nebulizer if you first try Plan B and it fails to help > ok to use the nebulizer up to every 4 hours but if start needing it regularly call for immediate appointment     Please remember to go to the lab and x-ray department   for your tests - we will call you with the results when they are available.     Please schedule a follow up office visit in 4 weeks, sooner if needed

## 2018-11-24 ENCOUNTER — Encounter (HOSPITAL_COMMUNITY): Payer: Self-pay

## 2018-11-24 ENCOUNTER — Encounter: Payer: Self-pay | Admitting: Internal Medicine

## 2018-11-24 ENCOUNTER — Telehealth (HOSPITAL_COMMUNITY): Payer: Self-pay | Admitting: Licensed Clinical Social Worker

## 2018-11-24 ENCOUNTER — Emergency Department (HOSPITAL_COMMUNITY)
Admission: EM | Admit: 2018-11-24 | Discharge: 2018-11-25 | Disposition: A | Discharge status: Home / Self Care | Payer: Medicare Other | Attending: Emergency Medicine | Admitting: Emergency Medicine

## 2018-11-24 ENCOUNTER — Other Ambulatory Visit: Payer: Self-pay

## 2018-11-24 DIAGNOSIS — Z87891 Personal history of nicotine dependence: Secondary | ICD-10-CM | POA: Diagnosis not present

## 2018-11-24 DIAGNOSIS — I509 Heart failure, unspecified: Secondary | ICD-10-CM | POA: Insufficient documentation

## 2018-11-24 DIAGNOSIS — R079 Chest pain, unspecified: Secondary | ICD-10-CM | POA: Diagnosis present

## 2018-11-24 DIAGNOSIS — J45909 Unspecified asthma, uncomplicated: Secondary | ICD-10-CM | POA: Diagnosis not present

## 2018-11-24 DIAGNOSIS — J449 Chronic obstructive pulmonary disease, unspecified: Secondary | ICD-10-CM | POA: Insufficient documentation

## 2018-11-24 DIAGNOSIS — I110 Hypertensive heart disease with heart failure: Secondary | ICD-10-CM | POA: Diagnosis not present

## 2018-11-24 DIAGNOSIS — R0789 Other chest pain: Secondary | ICD-10-CM | POA: Diagnosis not present

## 2018-11-24 DIAGNOSIS — Z79899 Other long term (current) drug therapy: Secondary | ICD-10-CM | POA: Insufficient documentation

## 2018-11-24 LAB — BASIC METABOLIC PANEL
Anion gap: 12 (ref 5–15)
BUN: 23 mg/dL (ref 8–23)
CO2: 20 mmol/L — ABNORMAL LOW (ref 22–32)
Calcium: 9.4 mg/dL (ref 8.9–10.3)
Chloride: 104 mmol/L (ref 98–111)
Creatinine, Ser: 1.45 mg/dL — ABNORMAL HIGH (ref 0.61–1.24)
GFR calc Af Amer: 58 mL/min — ABNORMAL LOW (ref >60)
GFR calc non Af Amer: 50 mL/min — ABNORMAL LOW (ref >60)
Glucose, Bld: 152 mg/dL — ABNORMAL HIGH (ref 70–99)
Potassium: 4.3 mmol/L (ref 3.5–5.1)
Sodium: 136 mmol/L (ref 135–145)

## 2018-11-24 LAB — CBC
HCT: 40.2 % (ref 39.0–52.0)
Hemoglobin: 13 g/dL (ref 13.0–17.0)
MCH: 27.5 pg (ref 26.0–34.0)
MCHC: 32.3 g/dL (ref 30.0–36.0)
MCV: 85.2 fL (ref 80.0–100.0)
Platelets: 319 10*3/uL (ref 150–400)
RBC: 4.72 MIL/uL (ref 4.22–5.81)
RDW: 13.8 % (ref 11.5–15.5)
WBC: 4.4 10*3/uL (ref 4.0–10.5)
nRBC: 0 % (ref 0.0–0.2)

## 2018-11-24 LAB — TROPONIN I: Troponin I: <0.03 ng/mL (ref <0.03)

## 2018-11-24 MED ORDER — SODIUM CHLORIDE 0.9% FLUSH: 3.0000 mL | Freq: Once | INTRAVENOUS | Status: DC

## 2018-11-24 NOTE — Telephone Encounter (Signed)
Paramedicine Initial Assessment:  Housing:  In what kind of housing do you live? House/apt/trailer/shelter? Apt  Do you rent/pay a mortgage/own? Rent  Do you live with anyone? no  Are you currently worried about losing your housing? no  Within the past 12 months have you ever stayed outside, in a car, tent, a shelter, or temporarily with someone? no  Within the past 12 months have you been unable to get utilities when it was really needed? no  Social:  What is your current marital status? single  Do you have any children? 9  Do you have family or friends who live locally? Multiple children live locally but have families of their own  Food:  Within the past 12 months were you ever worried that food would run out before you got money to buy more? no  Within the past 61months have you run out of food and didn't have money to buy more? No  Pt is concerned about food security at this time due to access.  Was normally getting what he needed by using [employer] but they have suspended service starting today.  Pt also reports being scared to go to the grocery store due to his health condition.  Income:  What is your current source of income? Social security retirement  How hard is it for you to pay for the basics like food housing, medical care, and utilities? Not very hard  Do you have outstanding medical bills? no  Insurance:  Are you currently insured? yes  Do you have prescription coverage?yes   If no insurance, have you applied for coverage (Medicaid, disability, marketplace etc)? Has medicaid but is now eligible for Medicare but has not applied yet- states his daughter is coming over tomorrow to help him through the process as he cannot read or write.  Transportation:  Do you have transportation to your medical appointments? Yes   If yes, how? Has his children transport him but it is sometimes difficult to get a ride when he needs due to everyones schedule.  In the past 12  months has lack of transportation kept you from medical appts or from getting medications? no  In the past 12 months has lack of transportation kept you from meetings, work, or getting things you needed? no   Daily Health Needs: Do you have a working scale at home? yes  How do you manage your medications at home? Keeps them in the bottle and memorizes how to take them.  Do you ever take your medications differently than prescribed? no  Do you have issues affording your medications? no  If yes, has this ever prevented you from obtaining medications? n/a  Do you have any concerns with mobility at home? Thinks a cane might be beneficial but doesn't use anything at this time.  Do you use any assistive devices at home or have PCS at home? no   Are there any additional barriers you see to getting the care you need? no  CSW will continue to follow through paramedicine program and assist as needed.   [name], LCSW Clinical Social Worker [facility] Desk#: [phone] Cell#: [phone]

## 2018-11-24 NOTE — Assessment & Plan Note (Addendum)
Quit smoking 2012  - 11/23/2018  After extensive coaching inhaler device,  effectiveness =    75% from a baseline of 25%     When respiratory symptoms begin or become refractory well after a patient reports complete smoking cessation,  Especially when this wasn't the case while they were smoking, a red flag is raised based on the work of Dr Kris Mouton which states:  if you quit smoking when your best day FEV1 is still well preserved it is highly unlikely you will progress to severe disease.  That is to say, once the smoking stops,  the symptoms should not suddenly erupt or markedly worsen.  If so, the differential diagnosis should include  obesity/deconditioning,  LPR/Reflux/Aspiration syndromes,  occult CHF, or  especially side effect of medications commonly used in this population.     Since he's never had pfts hard to know how bad he was in 2012 but clearly has AB component that needs to be addressed first and may have also had cardiac asthma element as well but this appears to have resolved by most recent w/u    >>> rec symb 160 2bid/pred x 6 days and reduce saba if possible    return for pfts before samples run out    Total time devoted to counseling  > 50 % of initial 60 min office visit:  review case with pt/ device teaching which extended face to face time for this visit /discussion of options/alternatives/ personally creating written customized instructions  in presence of pt  then going over those specific  Instructions directly with the pt including how to use all of the meds but in particular covering each new medication in detail and the difference between the maintenance= "automatic" meds and the prns using an action plan format for the latter (If this problem/symptom => do that organization reading Left to right).  Please see AVS from this visit for a full list of these instructions which I personally wrote for this pt and  are unique to this visit.

## 2018-11-24 NOTE — Assessment & Plan Note (Addendum)
Onset 2012   DDX of  difficult airways management almost all start with A and  include Adherence, Ace Inhibitors, Acid Reflux, Active Sinus Disease, Alpha 1 Antitripsin deficiency, Anxiety masquerading as Airways dz,  ABPA,  Allergy(esp in young), Aspiration (esp in elderly), Adverse effects of meds,  Active smoking or vaping, A bunch of PE's (a small clot burden can't cause this syndrome unless there is already severe underlying pulm or vascular dz with poor reserve) plus two Bs  = Bronchiectasis and Beta blocker use..and one C= CHF   Adherence is always the initial "prime suspect" and is a multilayered concern that requires a "trust but verify" approach in every patient - starting with knowing how to use medications, especially inhalers, correctly, keeping up with refills and understanding the fundamental difference between maintenance and prns vs those medications only taken for a very short course and then stopped and not refilled.  - see copd/ hfa training - return with all meds in hand using a trust but verify approach to confirm accurate Medication  Reconciliation The principal here is that until we are certain that the  patients are doing what we've asked, it makes no sense to ask them to do more.   ?allergy/ asthma > note eos over 0.3 strongly suggest >>> rx  Prednisone 10 mg take  4 each am x 2 days,   2 each am x 2 days,  1 each am x 2 days and stop and start symb 160 2bid   ? Adverse drug effects > way over using saba and now on amiodarone needs ongoing surveillance for toxicity though low esr and cxr encouraging - Patients typically have been on amiodarone for 6-12 months before this complication manifests.  Of note, serial clinical evaluation for symptoms such as cough dyspnea or fevers is  the preferred method of monitoring for pulmonary toxicity because a decrease in DLCO or lung volumes is a nonspecific for toxicity. Pathologically amiodarone pulmonary toxicity may appear as interstitial  pneumonitis, eosinophilic pneumonia, organizing pneumonia, pulmonary fibrosis or less commonly as diffuse alveolar hemorrhage, pulmonary nodules or pleural effusions.  Risk factors for pulmonary toxicity include age greater than 43, daily dose greater than equal to 400 mg, a high cumulative dose, or pre-existing lung disease>>   So he has at least 2 risk factors  ? Acid (or non-acid) GERD > always difficult to exclude as up to 75% of pts in some series report no assoc GI/ Heartburn symptoms> rec  diet restrictions/ reviewed and instructions given but hold off gerd rx for now and just treat as siimple copd/AB and see if upper airway wheeze abate.  Alpha one AT def > very rare in AA's  ? BB effects > unlikely with such low doses of coreg   ? CHF > rx per cards

## 2018-11-24 NOTE — Progress Notes (Signed)
Spoke with pt and notified of results per Dr. Wert. Pt verbalized understanding and denied any questions. 

## 2018-11-24 NOTE — ED Triage Notes (Addendum)
Pt arrives POV for eval of hypertension w/ HA 'all day'. Pt reports taking 4 doses of tylenol today w/ no relief. No neuro deficits in triage, GCS 15, AOX4. Reports hx of hypertension. States chest pain as well onset this AM.

## 2018-11-24 NOTE — Telephone Encounter (Signed)
Pt referred to [facility] program to assist with current food concerns.  CSW explained that pt would be receiving one delivery per week of about 7 meals.  Explained that meals would be delivered to apartment with no face to face contact- pt expressed understanding.  CSW will continue to follow and assist as needed  [name], [facility] Desk#: [phone] Cell#: [phone]

## 2018-11-24 NOTE — ED Provider Notes (Signed)
TIME SEEN: 11:14 PM  CHIEF COMPLAINT: Chest pain, headache  HPI: Patient is a 68 year old male with history of hypertension, nonischemic cardiomyopathy, tobacco use who presents to the emergency department with concerns for headache, chest pain, abdominal pain that started today.  States he had a "migraine headache" all day today.  Took several Tylenol tablets (he thinks approximately 8 tablets tonight) for his headache and states it is now gone.  No numbness or focal weakness.  No head injury.  States "I think it is from my sinuses draining".  Denies fevers, cough.  Also reports he had left chest "pounding" that felt like his heart was racing and had a left-sided pressure that started at 9 PM without shortness of breath.  States he did have some diaphoresis but no nausea, vomiting or dizziness.  Blood pressure was found to be very elevated when he arrived in the emergency department but has come down without intervention.  He states "I think this was gas".  He states he belched several times and pass gas and now is feeling much better and ready to go home.  Echo 10/04/18:  FINDINGS  Left Ventricle: The left ventricle has moderate-severely reduced systolic function, with an ejection fraction of 30-35%. The cavity size was mildly dilated. There is no increase in left ventricular wall thickness. Left ventricular diastolic Doppler  parameters are consistent with impaired relaxation. Right Ventricle: The right ventricle has normal systolic function. The cavity was normal. There is no increase in right ventricular wall thickness. Left Atrium: left atrial size was mildly dilated Right Atrium: right atrial size was normal in size. Right atrial pressure is estimated at 3 mmHg. Interatrial Septum: No atrial level shunt detected by color flow Doppler. Pericardium: There is no evidence of pericardial effusion. Mitral Valve: The mitral valve is normal in structure. Mitral valve regurgitation is mild by color flow  Doppler. Tricuspid Valve: The tricuspid valve is normal in structure. Tricuspid valve regurgitation is trivial by color flow Doppler. Aortic Valve: The aortic valve is tricuspid Mild calcification of the aortic valve. Aortic valve regurgitation is trivial by color flow Doppler. There is no stenosis of the aortic valve. Pulmonic Valve: The pulmonic valve was normal in structure. Pulmonic valve regurgitation is trivial by color flow Doppler. Aorta: The aortic root and ascending aorta are normal in size and structure. Venous: The inferior vena cava is normal in size with greater than 50% respiratory variability.  Cath 10/04/18:   LV end diastolic pressure is low.  There is no aortic valve stenosis.  No significant CAD.   ROS: See HPI Constitutional: no fever  Eyes: no drainage  ENT: no runny nose   Cardiovascular:  no chest pain  Resp: no SOB  GI: no vomiting GU: no dysuria Integumentary: no rash  Allergy: no hives  Musculoskeletal: no leg swelling  Neurological: no slurred speech ROS otherwise negative  PAST MEDICAL HISTORY/PAST SURGICAL HISTORY:  Past Medical History:  Diagnosis Date  . Alcoholism ([place])   . Arthritis    hips, L shoulder  . Asthma   . CHF (congestive heart failure) ([place])   . Depression   . Family history of anesthesia complication   . Hyperlipidemia   . Hypertension     MEDICATIONS:  Prior to Admission medications   Medication Sig Start Date End Date Taking? Authorizing Provider  acetaminophen (TYLENOL) 500 MG tablet Take 2 tablets (1,000 mg total) by mouth 3 (three) times daily as needed for headache (pain). 07/28/18   Dhungel, Nishant,  MD  albuterol (PROVENTIL HFA;VENTOLIN HFA) 108 (90 Base) MCG/ACT inhaler Inhale 1-2 puffs into the lungs every 4 (four) hours as needed for wheezing or shortness of breath. 02/23/18   Melynda Ripple, MD  albuterol (PROVENTIL) (2.5 MG/3ML) 0.083% nebulizer solution Take 3 mLs (2.5 mg total) by nebulization every 6 (six)  hours as needed for wheezing or shortness of breath. 02/23/18   Melynda Ripple, MD  amiodarone (PACERONE) 200 MG tablet Take 1 tablet (200 mg total) by mouth daily. 08/17/18   Shirley Friar, PA-C  amLODipine (NORVASC) 10 MG tablet Take 1 tablet (10 mg total) by mouth daily. Patient taking differently: Take 5 mg by mouth daily.  07/14/18   Bensimhon, Shaune Pascal, MD  budesonide-formoterol (SYMBICORT) 160-4.5 MCG/ACT inhaler Inhale 2 puffs into the lungs 2 (two) times daily. 11/23/18   Tanda Rockers, MD  carvedilol (COREG) 3.125 MG tablet TAKE 1 TABLET BY MOUTH TWICE DAILY WITH MEALS 07/19/18   Bensimhon, Shaune Pascal, MD  dextromethorphan-guaiFENesin [facility] DM) 30-600 MG 12hr tablet Take 1 tablet by mouth 2 (two) times daily. Patient not taking: Reported on 11/23/2018 07/28/18   Dhungel, Flonnie Overman, MD  fluticasone (FLONASE) 50 MCG/ACT nasal spray Place 2 sprays into both nostrils daily. 02/23/18   Melynda Ripple, MD  furosemide (LASIX) 20 MG tablet Take 1 tablet (20 mg total) by mouth daily as needed (weight gain). Weight gain 07/07/18   Bensimhon, Shaune Pascal, MD  loratadine (CLARITIN) 10 MG tablet Take 10 mg by mouth daily.    [provider]  losartan (COZAAR) 25 MG tablet Take 1 tablet (25 mg total) by mouth daily. 09/14/18   Shirley Friar, PA-C  polyethylene glycol powder (GLYCOLAX/MIRALAX) powder Take 17 g by mouth daily as needed for mild constipation.    [provider]  predniSONE (DELTASONE) 10 MG tablet Take  4 each am x 2 days,   2 each am x 2 days,  1 each am x 2 days and stop 11/23/18   Tanda Rockers, MD  spironolactone (ALDACTONE) 25 MG tablet Take 0.5 tablets (12.5 mg total) by mouth daily. 07/07/18 11/23/18  Bensimhon, Shaune Pascal, MD    ALLERGIES:  Allergies  Allergen Reactions  . Lisinopril Swelling    Facial and tongue swelling 10/2012    SOCIAL HISTORY:  Social History   Tobacco Use  . Smoking status: Former Smoker    Packs/day: 0.10    Years:  45.00    Pack years: 4.50    Last attempt to quit: 2012    Years since quitting: 8.3  . Smokeless tobacco: Never Used  Substance Use Topics  . Alcohol use: No    Frequency: Never    Comment: Pt states no etoh since April 2014    FAMILY HISTORY: Family History  Problem Relation Age of Onset  . Heart disease Father   . Heart disease Daughter        "fluid around heart"   . [place] disease Daughter   . Cancer Daughter        unsure of type  . Asthma Son     EXAM: BP (!) 153/84   Pulse 63   Temp 98.5 F (36.9 C) (Oral)   Resp 16   Ht 5\' 11"  (1.803 m)   Wt 69 kg   SpO2 100%   BMI 21.22 kg/m  CONSTITUTIONAL: Alert and oriented and responds appropriately to questions.  Thin, chronically ill-appearing, smiling, laughing HEAD: Normocephalic EYES: Conjunctivae clear, pupils appear equal, EOMI  ENT: normal nose; moist mucous membranes NECK: Supple, no meningismus, no nuchal rigidity, no LAD  CARD: RRR; S1 and S2 appreciated; no murmurs, no clicks, no rubs, no gallops RESP: Normal chest excursion without splinting or tachypnea; breath sounds clear and equal bilaterally; no wheezes, no rhonchi, no rales, no hypoxia or respiratory distress, speaking full sentences ABD/GI: Normal bowel sounds; non-distended; soft, non-tender, no rebound, no guarding, no peritoneal signs, no hepatosplenomegaly BACK:  The back appears normal and is non-tender to palpation, there is no CVA tenderness EXT: Normal ROM in all joints; non-tender to palpation; no edema; normal capillary refill; no cyanosis, no calf tenderness or swelling    SKIN: Normal color for age and race; warm; no rash NEURO: Moves all extremities equally reports normal sensation diffusely, cranial nerves II through XII intact, normal speech PSYCH: The patient's mood and manner are appropriate. Grooming and personal hygiene are appropriate.  MEDICAL DECISION MAKING: Patient here with atypical chest pain.  Does have risk factors for ACS  but had a cardiac catheterization March 9 that showed normal coronary arteries without disease.  Does have history of nonischemic cardiomyopathy but does not appear volume overloaded on exam.  Headache, chest pain, abdominal pain have resolved on their own.  I have low suspicion for intracranial hemorrhage, dissection, PE, stroke.  Lungs are clear.  Doubt COPD exacerbation.  Labs obtained in triage are unremarkable other than chronic kidney disease which is stable.  Patient has had a negative troponin.  EKG shows no new ischemic changes.  Patient agrees to stay for chest x-ray but would like to be discharged after that.  His blood pressure has improved without intervention.  His abdominal exam today is benign.  Doubt cholecystitis, pancreatitis, diverticulitis, colitis, bowel obstruction, appendicitis.  No infectious symptoms.  Low suspicion for COVID-19.  He states he did take multiple Tylenol today.  Thinks it was no more than 8 tablets tonight.  He does not think he took more than 4000 mg in a 24-hour period.  He is not sure of the dose of Tylenol that he took.  ED PROGRESS: Patient's chest x-ray is clear.  No pulmonary edema, pneumonia.  Tylenol level slightly elevated at 31.  He is unable to tell me when he took his last dose.  Will check a 4-hour Tylenol level and repeat a second troponin at that time.  He is comfortable with this plan.  Still complaining of some chest discomfort.  He feels like this is gas.  Will treat with Maalox and reassess.  5:45 AM  Pt's repeat Tylenol level is now negative.  His second troponin is normal.  Pain is completely gone after Maalox.  Agree this seems very atypical for ACS and that he can be discharged.  His blood pressure has been normal.  Have advised him to make sure he is only taking thousand gram Tylenol every 6 hours as needed for pain.  Recommended over-the-counter Maalox as needed for indigestion.   At this time, I do not feel there is any life-threatening  condition present. I have reviewed and discussed all results (EKG, imaging, lab, urine as appropriate) and exam findings with patient/family. I have reviewed nursing notes and appropriate previous records.  I feel the patient is safe to be discharged home without further emergent workup and can continue workup as an outpatient as needed. Discussed usual and customary return precautions. Patient/family verbalize understanding and are comfortable with this plan.  Outpatient follow-up has been provided as needed. All questions have  been answered.    EKG Interpretation  Date/Time:  Wednesday November 24 2018 22:01:23 EDT Ventricular Rate:  92 PR Interval:  230 QRS Duration: 94 QT Interval:  340 QTC Calculation: 420 R Axis:   34 Text Interpretation:  Sinus rhythm with 1st degree A-V block with frequent and consecutive Premature ventricular complexes Septal infarct , age undetermined Abnormal ECG agree, normalization of T waves compared to previous.  Confirmed by Charlesetta Shanks [phone]) on 11/24/2018 10:33:18 PM Also confirmed by Pryor Curia [phone])  on 11/24/2018 11:15:05 PM          Jahon Bart, Delice Bison, DO 11/25/18 [id number]

## 2018-11-24 NOTE — ED Notes (Signed)
Pt is refusing cxr at this time. He burped and feels better know.

## 2018-11-25 ENCOUNTER — Emergency Department (HOSPITAL_COMMUNITY): Payer: Medicare Other

## 2018-11-25 ENCOUNTER — Other Ambulatory Visit (HOSPITAL_COMMUNITY): Payer: Self-pay

## 2018-11-25 ENCOUNTER — Encounter (HOSPITAL_COMMUNITY): Payer: Self-pay

## 2018-11-25 ENCOUNTER — Ambulatory Visit (HOSPITAL_BASED_OUTPATIENT_CLINIC_OR_DEPARTMENT_OTHER)
Admission: RE | Admit: 2018-11-25 | Discharge: 2018-11-25 | Disposition: A | Discharge status: Home / Self Care | Payer: Medicare Other | Source: Ambulatory Visit | Attending: Internal Medicine | Admitting: Internal Medicine

## 2018-11-25 VITALS — BP 148/78 | HR 68 | Temp 98.0°F | Wt 146.0 lb

## 2018-11-25 DIAGNOSIS — I5042 Chronic combined systolic (congestive) and diastolic (congestive) heart failure: Secondary | ICD-10-CM | POA: Diagnosis not present

## 2018-11-25 DIAGNOSIS — I110 Hypertensive heart disease with heart failure: Secondary | ICD-10-CM

## 2018-11-25 DIAGNOSIS — J449 Chronic obstructive pulmonary disease, unspecified: Secondary | ICD-10-CM

## 2018-11-25 DIAGNOSIS — I493 Ventricular premature depolarization: Secondary | ICD-10-CM

## 2018-11-25 DIAGNOSIS — I10 Essential (primary) hypertension: Secondary | ICD-10-CM

## 2018-11-25 DIAGNOSIS — R0789 Other chest pain: Secondary | ICD-10-CM | POA: Diagnosis not present

## 2018-11-25 DIAGNOSIS — Z550 Illiteracy and low-level literacy: Secondary | ICD-10-CM

## 2018-11-25 DIAGNOSIS — Q600 Renal agenesis, unilateral: Secondary | ICD-10-CM

## 2018-11-25 LAB — HEPATIC FUNCTION PANEL
ALT: 16 U/L (ref 0–44)
AST: 20 U/L (ref 15–41)
Albumin: 4.1 g/dL (ref 3.5–5.0)
Alkaline Phosphatase: 81 U/L (ref 38–126)
Bilirubin, Direct: <0.1 mg/dL (ref 0.0–0.2)
Total Bilirubin: 0.5 mg/dL (ref 0.3–1.2)
Total Protein: 7.6 g/dL (ref 6.5–8.1)

## 2018-11-25 LAB — BRAIN NATRIURETIC PEPTIDE: B Natriuretic Peptide: 146.5 pg/mL — ABNORMAL HIGH (ref 0.0–100.0)

## 2018-11-25 LAB — SALICYLATE LEVEL: Salicylate Lvl: <7 mg/dL (ref 2.8–30.0)

## 2018-11-25 LAB — TROPONIN I: Troponin I: <0.03 ng/mL (ref <0.03)

## 2018-11-25 LAB — ACETAMINOPHEN LEVEL
Acetaminophen (Tylenol), Serum: 31 ug/mL — ABNORMAL HIGH (ref 10–30)
Acetaminophen (Tylenol), Serum: <10 ug/mL — ABNORMAL LOW (ref 10–30)

## 2018-11-25 MED ORDER — ALUM & MAG HYDROXIDE-SIMETH 200-200-20 MG/5ML PO SUSP
30.0000 mL | Freq: Once | ORAL | Status: AC
  Administered 2018-11-25: 02:00:00 30 mL via ORAL
  Filled 2018-11-25: qty 30

## 2018-11-25 NOTE — Progress Notes (Signed)
Paramedicine Encounter    Patient ID: Joe Garcia, male    DOB: 1951/03/25, 68 y.o.   MRN: [id number]   Patient Care Team: Medicine, [facility] as PCP - General [name], LCSW as Social Worker (Licensed Clinical Social Worker)  Patient Active Problem List   Diagnosis Date Noted  . COPD GOLD ?  11/24/2018  . Chest pain   . NSTEMI (non-ST elevated myocardial infarction) ([place]) 10/03/2018  . COPD with acute exacerbation ([place]) 07/26/2018  . Weight loss, unintentional 07/26/2018  . Malnutrition of moderate degree 07/16/2017  . 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 Solitary kidney, congenital 06/23/2017  . Nonischemic cardiomyopathy ([place])   . Multifocal PVCs   . Chronic combined systolic (congestive) and diastolic (congestive) heart failure ([place]) 06/11/2017  . DOE (dyspnea on exertion) 07/16/2013  . Alcohol abuse 09/25/2012  . Alcohol dependence ([place]) 09/22/2012  . Essential hypertension 03/22/2007  . DEGENERATIVE DISC DISEASE 03/22/2007  . AVASCULAR NECROSIS 03/22/2007    Current Outpatient Medications:  .  acetaminophen (TYLENOL) 500 MG tablet, Take 2 tablets (1,000 mg total) by mouth 3 (three) times daily as needed for headache (pain)., Disp: 30 tablet, Rfl: 0 .  albuterol (PROVENTIL HFA;VENTOLIN HFA) 108 (90 Base) MCG/ACT inhaler, Inhale 1-2 puffs into the lungs every 4 (four) hours as needed for wheezing or shortness of breath., Disp: 1 Inhaler, Rfl: 0 .  albuterol (PROVENTIL) (2.5 MG/3ML) 0.083% nebulizer solution, Take 3 mLs (2.5 mg total) by nebulization every 6 (six) hours as needed for wheezing or shortness of breath., Disp: 75 mL, Rfl: 0 .  amiodarone (PACERONE) 200 MG tablet, Take 1 tablet (200 mg total) by mouth daily., Disp: 90 tablet, Rfl: 3 .  budesonide-formoterol (SYMBICORT) 160-4.5 MCG/ACT inhaler, Inhale 2 puffs into the lungs 2 (two) times daily., Disp: 1 Inhaler, Rfl: 11 .  carvedilol (COREG) 3.125 MG tablet, TAKE 1 TABLET BY  MOUTH TWICE DAILY WITH MEALS (Patient taking differently: Take 3.125 mg by mouth 2 (two) times daily with a meal. ), Disp: 60 tablet, Rfl: 5 .  dextromethorphan-guaiFENesin (MUCINEX DM) 30-600 MG 12hr tablet, Take 1 tablet by mouth 2 (two) times daily., Disp: 10 tablet, Rfl: 0 .  fluticasone (FLONASE) 50 MCG/ACT nasal spray, Place 2 sprays into both nostrils daily., Disp: 16 g, Rfl: 0 .  furosemide (LASIX) 20 MG tablet, Take 1 tablet (20 mg total) by mouth daily as needed (weight gain). Weight gain, Disp: 30 tablet, Rfl: 5 .  loratadine (CLARITIN) 10 MG tablet, Take 10 mg by mouth daily., Disp: , Rfl:  .  losartan (COZAAR) 25 MG tablet, Take 1 tablet (25 mg total) by mouth daily., Disp: 30 tablet, Rfl: 6 .  polyethylene glycol powder (GLYCOLAX/MIRALAX) powder, Take 17 g by mouth daily as needed for mild constipation., Disp: , Rfl:  .  predniSONE (DELTASONE) 10 MG tablet, Take  4 each am x 2 days,   2 each am x 2 days,  1 each am x 2 days and stop (Patient taking differently: Take 10-40 mg by mouth See admin instructions. Take 4 tablets every morning for 2 days then take 2 tablets every morning for 2 days then take 1 tablet every morning for 2 days then stop), Disp: 14 tablet, Rfl: 0 .  spironolactone (ALDACTONE) 25 MG tablet, Take 0.5 tablets (12.5 mg total) by mouth daily., Disp: 15 tablet, Rfl: 3 .  amLODipine (NORVASC) 5 MG tablet, Take 5 mg by mouth daily., Disp: , Rfl:  Allergies  Allergen Reactions  . Lisinopril Swelling    Facial and tongue swelling 10/2012      Social History   Socioeconomic History  . Marital status: Single    Spouse name: Not on file  . Number of children: Not on file  . Years of education: Not on file  . Highest education level: Not on file  Occupational History  . Occupation: "it don't work for me", on disability  Social Needs  . Financial resource strain: Not on file  . Food insecurity:    Worry: Not on file    Inability: Not on file  . Transportation  needs:    Medical: Not on file    Non-medical: Not on file  Tobacco Use  . Smoking status: Former Smoker    Packs/day: 0.10    Years: 45.00    Pack years: 4.50    Last attempt to quit: 2012    Years since quitting: 8.3  . Smokeless tobacco: Never Used  Substance and Sexual Activity  . Alcohol use: No    Frequency: Never    Comment: Pt states no etoh since April 2014  . Drug use: No    Comment: Prior use of crack cocaine, quit 2012  . Sexual activity: Never  Lifestyle  . Physical activity:    Days per week: Not on file    Minutes per session: Not on file  . Stress: Not on file  Relationships  . Social connections:    Talks on phone: Not on file    Gets together: Not on file    Attends religious service: Not on file    Active member of club or organization: Not on file    Attends meetings of clubs or organizations: Not on file    Relationship status: Not on file  . Intimate partner violence:    Fear of current or ex partner: Not on file    Emotionally abused: Not on file    Physically abused: Not on file    Forced sexual activity: Not on file  Other Topics Concern  . Not on file  Social History Narrative  . Not on file    Physical Exam HENT:     Head: Normocephalic.  Cardiovascular:     Rate and Rhythm: Normal rate.     Pulses: Normal pulses.  Pulmonary:     Effort: Pulmonary effort is normal.     Breath sounds: Normal breath sounds.  Abdominal:     Palpations: Abdomen is soft.  Musculoskeletal:        General: No swelling.     Right lower leg: No edema.     Left lower leg: No edema.  Skin:    General: Skin is warm and dry.  Neurological:     General: No focal deficit present.     Mental Status: He is alert and oriented to person, place, and time.  Psychiatric:        Mood and Affect: Mood normal.         Future Appointments  Date Time Provider [place]  12/28/2018  1:00 PM [facility] PFT ROOM [facility] None  12/28/2018  2:15 PM Melvyn Novas,  Christena Deem, MD [facility] None    BP (!) 148/78   Pulse 68   Temp 98.1 F (36.7 C)   Resp 15   Wt 146 lb (66.2 kg)   SpO2 98%   BMI 20.36 kg/m   Weight yesterday-144   First visit with pt since  re-referral, pt was just seen in the ER last night for h/a and chest pain. He was released this morning. He felt it was more gas related. He is living in high rise apts. He does get sob upon far walks.  He does weigh daily.    He uses Holiday representative on [address]. There is one closer to him but he prefers to have a "job" to do and get out to go do something. He has children that can help out when needed most of the time as long as they have enough notice. He normally catches the bus however with COVID-19 he has not been taking bus routes.  Med review-he takes lasix as needed. He was able to tell me how to take each pill correctly. He has trouble seeing but does fine as long as he has his glasses. He did use a pill box but he lost it during the move, I will get him another one. He denies missed doses of his meds.  He reports he doesn't eat much sometimes to keep the fluid off.  He states he took too much tylenol when he started having the h/a as well. He is aware of how much to take in a day. He c/p is resolved.  He reports keeping his fluid intake less than 2L.  He has been checking his b/p at home-he reports it has been good except for yesterday when he started having h/a. He said he ate a can of breakfast sausage and the next day he began to feel bad after he ate that.  Virtual visit with amy today-he did take his meds around 7am when he returned home from ER. He reports good urine output. No edema noted. He states he gets it in his abd, legs and hands. None noted today.  He c/o dizziness when he bends over and up too fast.   jenna contacted him yesterday regarding referral for food delivery.  He does not weigh daily however he states he weighs about every other day-I advised him to weigh  daily would be best.  He will need EKG at next weeks visit.   Marylouise Stacks, [facility] Paramedic  11/25/18

## 2018-11-25 NOTE — ED Notes (Signed)
Patient transported to X-ray 

## 2018-11-25 NOTE — Progress Notes (Signed)
Heart Failure TeleHealth Note  Due to national recommendations of social distancing due to [place] 19, Audio/video telehealth visit is felt to be most appropriate for this patient at this time.  See MyChart message from today for patient consent regarding telehealth for [facility]  Date:  11/25/2018   ID:  [name], DOB [birth year], MRN [id number]  Location: Home  Provider location: [place] [facility] Failure Type of Visit: Established patient  PCP:  Medicine, [facility]  Cardiologist:  No primary care provider on file. Primary HF: Dr [name]  Chief Complaint: Heart Failure  History of Present Illness: Joe Garcia is a 68 y.o. 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 Former crack cocaine abuse.   Admitted 11/14 - 06/15/17 with CP and DOE. Received steroids and nebs. BNP minimally elevated on arrival. Troponin negative.  R/LHC with normal cors and compensated hemodynamics. 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   Seen in ED 09/09/17 with N/V and "chest pain". CP felt like his normal reflux. EKG with PVCs and NSR. Had been seen at home by paramedicine and told HR was in 30s, but normal in 60s on monitor. Likely undercounting due to PVCs.  Work up unremarkable.   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   Readmitted with chest pain 10/03/18 with chest pain. Cardiac enzymes minimally elevated but with EKGs he was set up for LHC. 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 ECHO completed and showed EF 30-35% and normal RV function. He was discharged on 10/05/18 with plans for HF follow up.   On April 29th he was started on prednisone by pulmonary for 7 days.    He presents via Administrator for a telehealth visit today with HF Paramedic. Yesterday he presented to the ED with headache and chest pain. EKG and troponin negative. He was given  tylenol with improvement in symptoms. He was discharged this morning. Overall feeling much better.  Denies SOB/PND/Orthopnea. Appetite ok. No fever or chills. Weight at home 146  pounds. Taking all medications. Lost his pill box.    he denies symptoms worrisome for COVID 19.   ECHO 05/06/2017 EF 30-35% Grade IDD Echo 09/30/2017: EF 40-45%, grade 2 DD ECHO 3/20202: EF 30-35% RV normal  Past Medical History:  Diagnosis Date  . Alcoholism ([place])   . Arthritis    hips, L shoulder  . Asthma   . CHF (congestive heart failure) ([place])   . Depression   . Family history of anesthesia complication   . Hyperlipidemia   . Hypertension    Past Surgical History:  Procedure Laterality Date  . LEFT HEART CATH AND CORONARY ANGIOGRAPHY N/A 10/04/2018   Procedure: LEFT HEART CATH AND CORONARY ANGIOGRAPHY;  Surgeon: Jettie Booze, MD;  Location: [facility];  Service: Cardiovascular;  Laterality: N/A;  . RIGHT/LEFT HEART CATH AND CORONARY ANGIOGRAPHY N/A 06/12/2017   Procedure: RIGHT/LEFT HEART CATH AND CORONARY ANGIOGRAPHY;  Surgeon: Jolaine Artist, MD;  Location: [facility];  Service: Cardiovascular;  Laterality: N/A;     Current Outpatient Medications  Medication Sig Dispense Refill  . acetaminophen (TYLENOL) 500 MG tablet Take 2 tablets (1,000 mg total) by mouth 3 (three) times daily as needed for headache (pain). 30 tablet 0  . albuterol (PROVENTIL HFA;VENTOLIN HFA) 108 (90 Base) MCG/ACT inhaler Inhale 1-2 puffs into the lungs  every 4 (four) hours as needed for wheezing or shortness of breath. 1 Inhaler 0  . albuterol (PROVENTIL) (2.5 MG/3ML) 0.083% nebulizer solution Take 3 mLs (2.5 mg total) by nebulization every 6 (six) hours as needed for wheezing or shortness of breath. 75 mL 0  . amiodarone (PACERONE) 200 MG tablet Take 1 tablet (200 mg total) by mouth daily. 90 tablet 3  . amLODipine (NORVASC) 5 MG tablet Take 5 mg by mouth daily.    . budesonide-formoterol (SYMBICORT)  160-4.5 MCG/ACT inhaler Inhale 2 puffs into the lungs 2 (two) times daily. 1 Inhaler 11  . carvedilol (COREG) 3.125 MG tablet TAKE 1 TABLET BY MOUTH TWICE DAILY WITH MEALS (Patient taking differently: Take 3.125 mg by mouth 2 (two) times daily with a meal. ) 60 tablet 5  . dextromethorphan-guaiFENesin (MUCINEX DM) 30-600 MG 12hr tablet Take 1 tablet by mouth 2 (two) times daily. 10 tablet 0  . fluticasone (FLONASE) 50 MCG/ACT nasal spray Place 2 sprays into both nostrils daily. 16 g 0  . furosemide (LASIX) 20 MG tablet Take 1 tablet (20 mg total) by mouth daily as needed (weight gain). Weight gain 30 tablet 5  . loratadine (CLARITIN) 10 MG tablet Take 10 mg by mouth daily.    . polyethylene glycol powder (GLYCOLAX/MIRALAX) powder Take 17 g by mouth daily as needed for mild constipation.    . predniSONE (DELTASONE) 10 MG tablet Take  4 each am x 2 days,   2 each am x 2 days,  1 each am x 2 days and stop (Patient taking differently: Take 10-40 mg by mouth See admin instructions. Take 4 tablets every morning for 2 days then take 2 tablets every morning for 2 days then take 1 tablet every morning for 2 days then stop) 14 tablet 0  . spironolactone (ALDACTONE) 25 MG tablet Take 0.5 tablets (12.5 mg total) by mouth daily. 15 tablet 3  . losartan (COZAAR) 25 MG tablet Take 1 tablet (25 mg total) by mouth daily. 30 tablet 6   No current facility-administered medications for this encounter.     Allergies:   Lisinopril   Social History:  The patient  reports that he quit smoking about 8 years ago. He has a 4.50 pack-year smoking history. He has never used smokeless tobacco. He reports that he does not drink alcohol or use drugs.   Family History:  The patient's family history includes Asthma in his son; Canavan disease in his daughter; Cancer in his daughter; Heart disease in his daughter and father.   ROS:  Please see the history of present illness.   All other systems are personally reviewed and  negative.   Exam:  Tele Health Call; Exam is subjective  General:  Speaks in full sentences. No resp difficulty. Lungs: Normal respiratory effort with conversation.  Abdomen: Non-distended per patient report Extremities: Pt denies edema. Neuro: Alert & oriented x 3.   Recent Labs: 11/23/2018: TSH 1.08 11/24/2018: ALT 16; B Natriuretic Peptide 146.5; BUN 23; Creatinine, Ser 1.45; Hemoglobin 13.0; Platelets 319; Potassium 4.3; Sodium 136  Personally reviewed   Wt Readings from Last 3 Encounters:  11/25/18 66.2 kg (146 lb)  11/25/18 66.2 kg (146 lb)  11/24/18 69 kg (152 lb 1.9 oz)    Vitals:   11/25/18 1111  BP: (!) 148/78  Pulse: 68  Temp: 98 F (36.7 C)  SpO2: 98%     ASSESSMENT AND PLAN:  1.  Chronic Systolic Heart Failure NICM. 09/2018 LHC no  CAD. ECHO 09/2018 EF 30-35%. Need to repeat ECHO once HF meds optimized later this summer.  NYHA IIIb. He was instructed to take lasix 20 mg for weight 150 pounds or greater. Continue current dose of carvedilol, losartan, and spironolactone.  Continue HF Paramedic.   2. Former Smoker  Quit 7 years ago.  3. COPD  Inhalers per PCP On prednisone for 7 days per Pulmonary. PFTs ordered.  4. Low literacy.  Barrier to medication management. HF Paramedicine resumed today. 5. Headache Evaluated in ED last night. Resolved with tylenol.  6. HTN  Elevated but he says his BP is usually lower. Suspect prednisone may play a role. HF Paramedic will monitor.  7. PVCs On amiodarone 200 mg daily to suppress PVCs since 2018. May need to place zio patch to quantify. Ideally would like to get him off amio with exhisiting COPD.  Will need to watch for amio toxicity existing COPD.  Will need EKG next week.  Check TSH next office visit.  PFTs already ordered.    COVID screen The patient does not have any symptoms that suggest any further testing/ screening at this time.  Social distancing reinforced today.  Patient Risk: After full review of this  patients clinical status, I feel that they are at moderate risk for cardiac decompensation at this time.  Relevant cardiac medications were reviewed at length with the patient today. The patient does not have concerns regarding their medications at this time.   The following changes were made today: No medication changes.  Recommended follow-up:  4 weeks with HF . Continue HF Paramedicine. He will need EKG next week.   Today, I have spent 25  minutes with the patient with telehealth technology discussing the above issues .    Jeanmarie Hubert, NP  11/25/2018 11:29 AM  [place] [address] [facility] [place] [address] [phone] (office) [phone] (fax)

## 2018-11-25 NOTE — Discharge Instructions (Signed)
Please continue your blood pressure medications as prescribed.  Your blood pressure was elevated today but came down significantly on its own.  Your cardiac labs today were normal.  You may use Maalox over-the-counter as needed for gas, indigestion.  Please do not take more than 4000 mg of Tylenol in a day.  You may take 1000 mg which is 2 extra strength Tylenol tablets every 6 hours as needed for pain.

## 2018-11-25 NOTE — ED Notes (Signed)
Reviewed d/c instructions with pt, who verbalized understanding and had no outstanding questions. Pt labels disposed of in shred bin. Pt departed in NAD, refused use of wheelchair. Meeting family in lobby.

## 2018-11-25 NOTE — ED Notes (Signed)
Pt agrees to have CXR done at this time. Radiology called for transport.

## 2018-11-30 ENCOUNTER — Other Ambulatory Visit (HOSPITAL_COMMUNITY): Payer: Self-pay

## 2018-11-30 NOTE — Progress Notes (Signed)
Paramedicine Encounter    Patient ID: Joe Garcia, male    DOB: [birth year], 68 y.o.   MRN: [id number]   Patient Care Team: Medicine, [facility] as PCP - General [name], LCSW as Social Worker (Licensed Clinical Social Worker)  Patient Active Problem List   Diagnosis Date Noted  . COPD GOLD ?  11/24/2018  . Chest pain   . NSTEMI (non-ST elevated myocardial infarction) ([place]) 10/03/2018  . COPD with acute exacerbation ([place]) 07/26/2018  . Weight loss, unintentional 07/26/2018  . Malnutrition of moderate degree 07/16/2017  . 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 Solitary kidney, congenital 06/23/2017  . Nonischemic cardiomyopathy ([place])   . Multifocal PVCs   . Chronic combined systolic (congestive) and diastolic (congestive) heart failure ([place]) 06/11/2017  . DOE (dyspnea on exertion) 07/16/2013  . Alcohol abuse 09/25/2012  . Alcohol dependence ([place]) 09/22/2012  . Essential hypertension 03/22/2007  . DEGENERATIVE DISC DISEASE 03/22/2007  . AVASCULAR NECROSIS 03/22/2007    Current Outpatient Medications:  .  acetaminophen (TYLENOL) 500 MG tablet, Take 2 tablets (1,000 mg total) by mouth 3 (three) times daily as needed for headache (pain)., Disp: 30 tablet, Rfl: 0 .  albuterol (PROVENTIL HFA;VENTOLIN HFA) 108 (90 Base) MCG/ACT inhaler, Inhale 1-2 puffs into the lungs every 4 (four) hours as needed for wheezing or shortness of breath., Disp: 1 Inhaler, Rfl: 0 .  albuterol (PROVENTIL) (2.5 MG/3ML) 0.083% nebulizer solution, Take 3 mLs (2.5 mg total) by nebulization every 6 (six) hours as needed for wheezing or shortness of breath., Disp: 75 mL, Rfl: 0 .  amiodarone (PACERONE) 200 MG tablet, Take 1 tablet (200 mg total) by mouth daily., Disp: 90 tablet, Rfl: 3 .  amLODipine (NORVASC) 5 MG tablet, Take 5 mg by mouth daily., Disp: , Rfl:  .  budesonide-formoterol (SYMBICORT) 160-4.5 MCG/ACT inhaler, Inhale 2 puffs into the lungs 2 (two) times daily.,  Disp: 1 Inhaler, Rfl: 11 .  carvedilol (COREG) 3.125 MG tablet, TAKE 1 TABLET BY MOUTH TWICE DAILY WITH MEALS (Patient taking differently: Take 3.125 mg by mouth 2 (two) times daily with a meal. ), Disp: 60 tablet, Rfl: 5 .  fluticasone (FLONASE) 50 MCG/ACT nasal spray, Place 2 sprays into both nostrils daily., Disp: 16 g, Rfl: 0 .  furosemide (LASIX) 20 MG tablet, Take 1 tablet (20 mg total) by mouth daily as needed (weight gain). Weight gain, Disp: 30 tablet, Rfl: 5 .  loratadine (CLARITIN) 10 MG tablet, Take 10 mg by mouth daily., Disp: , Rfl:  .  losartan (COZAAR) 25 MG tablet, Take 1 tablet (25 mg total) by mouth daily., Disp: 30 tablet, Rfl: 6 .  polyethylene glycol powder (GLYCOLAX/MIRALAX) powder, Take 17 g by mouth daily as needed for mild constipation., Disp: , Rfl:  .  spironolactone (ALDACTONE) 25 MG tablet, Take 0.5 tablets (12.5 mg total) by mouth daily., Disp: 15 tablet, Rfl: 3 .  dextromethorphan-guaiFENesin (MUCINEX DM) 30-600 MG 12hr tablet, Take 1 tablet by mouth 2 (two) times daily. (Patient not taking: Reported on 11/30/2018), Disp: 10 tablet, Rfl: 0 .  predniSONE (DELTASONE) 10 MG tablet, Take  4 each am x 2 days,   2 each am x 2 days,  1 each am x 2 days and stop (Patient not taking: Reported on 11/30/2018), Disp: 14 tablet, Rfl: 0 Allergies  Allergen Reactions  . Lisinopril Swelling    Facial and tongue swelling 10/2012      Social History   Socioeconomic  History  . Marital status: Single    Spouse name: Not on file  . Number of children: Not on file  . Years of education: Not on file  . Highest education level: Not on file  Occupational History  . Occupation: "it don't work for me", on disability  Social Needs  . Financial resource strain: Not on file  . Food insecurity:    Worry: Not on file    Inability: Not on file  . Transportation needs:    Medical: Not on file    Non-medical: Not on file  Tobacco Use  . Smoking status: Former Smoker    Packs/day: 0.10     Years: 45.00    Pack years: 4.50    Last attempt to quit: 2012    Years since quitting: 8.3  . Smokeless tobacco: Never Used  Substance and Sexual Activity  . Alcohol use: No    Frequency: Never    Comment: Pt states no etoh since April 2014  . Drug use: No    Comment: Prior use of crack cocaine, quit 2012  . Sexual activity: Never  Lifestyle  . Physical activity:    Days per week: Not on file    Minutes per session: Not on file  . Stress: Not on file  Relationships  . Social connections:    Talks on phone: Not on file    Gets together: Not on file    Attends religious service: Not on file    Active member of club or organization: Not on file    Attends meetings of clubs or organizations: Not on file    Relationship status: Not on file  . Intimate partner violence:    Fear of current or ex partner: Not on file    Emotionally abused: Not on file    Physically abused: Not on file    Forced sexual activity: Not on file  Other Topics Concern  . Not on file  Social History Narrative  . Not on file    Physical Exam      Future Appointments  Date Time Provider [place]  12/28/2018  1:00 PM [facility] PFT ROOM [facility] None  12/28/2018  2:15 PM Melvyn Novas, Christena Deem, MD [facility] None    BP (!) 142/76   Pulse 60   Temp 98.1 F (36.7 C)   Resp 15   Wt 148 lb (67.1 kg)   BMI 20.64 kg/m   Weight yesterday-148 Last visit weight-146  Pt reports he is doing good, however he is limping and he said he hurt his leg/hip  helping someone move a fridge. But he does feel better. He states he gets h/a often, thinks its mostly due to allergies/pollen. Denies increased sob.  He denies any c/p. He states the prednisone did cause him to have increased hunger and he ate more and gained some weight. He has been taking the lasix til today.  Verified meds and ordered losartan and amio for refills.  He is having a lot of hip pain from yesterday, he has not taken anything  for it yet. 12lead done per amy last week to do one today--sinus at 60. No acute changes and no changes per Amy at this time.    Marylouise Stacks, [facility] Paramedic  11/30/18

## 2018-12-02 ENCOUNTER — Telehealth: Payer: Self-pay

## 2018-12-02 NOTE — Telephone Encounter (Signed)
Message received from Tammy Sours, [place] requesting an appointment for patient to establish care at [facility]. He has PCP at another clinic but wants to make a change.  Informed her that appointment has been scheduled for 12/15/2018 @ 0910. Provider to determine if in person or televisit

## 2018-12-03 ENCOUNTER — Telehealth (HOSPITAL_COMMUNITY): Payer: Self-pay | Admitting: Licensed Clinical Social Worker

## 2018-12-03 NOTE — Telephone Encounter (Signed)
CSW contacted patient to follow up on weekly food delivery package. Patient informed of delivery time and no face to face contact during delivery. Patient verbalizes understanding and grateful for the assistance.  CSW continues to follow for supportive needs. Jackie Perrin Gens, LCSW, CCSW-MCS [phone]  

## 2018-12-06 ENCOUNTER — Telehealth (HOSPITAL_COMMUNITY): Payer: Self-pay | Admitting: Licensed Clinical Social Worker

## 2018-12-06 NOTE — Telephone Encounter (Signed)
CSW informed by community paramedic that pt would like to switch PCP's and would prefer to go to [place].  CSW requested appt at [facility] and pt was set up for 12/15/18 at 9:10am- CSW informed pt of appt time.  Pt to submit paperwork to his Medicaid caseworker to get his PCP officially switched over.  CSW will continue to follow and assist as needed  [name], [facility] Desk#: [phone] Cell#: [phone]

## 2018-12-09 ENCOUNTER — Other Ambulatory Visit: Payer: Self-pay

## 2018-12-09 ENCOUNTER — Telehealth (HOSPITAL_COMMUNITY): Payer: Self-pay | Admitting: Cardiology

## 2018-12-09 ENCOUNTER — Encounter (HOSPITAL_COMMUNITY): Payer: Self-pay

## 2018-12-09 ENCOUNTER — Ambulatory Visit (HOSPITAL_COMMUNITY)
Admission: RE | Admit: 2018-12-09 | Discharge: 2018-12-09 | Disposition: A | Discharge status: Home / Self Care | Payer: Medicare Other | Source: Ambulatory Visit | Attending: Adult Health | Admitting: Adult Health

## 2018-12-09 ENCOUNTER — Other Ambulatory Visit (HOSPITAL_COMMUNITY): Payer: Self-pay

## 2018-12-09 VITALS — BP 170/82 | HR 58 | Wt 146.0 lb

## 2018-12-09 DIAGNOSIS — I428 Other cardiomyopathies: Secondary | ICD-10-CM

## 2018-12-09 DIAGNOSIS — Z550 Illiteracy and low-level literacy: Secondary | ICD-10-CM

## 2018-12-09 DIAGNOSIS — I5042 Chronic combined systolic (congestive) and diastolic (congestive) heart failure: Secondary | ICD-10-CM | POA: Diagnosis not present

## 2018-12-09 DIAGNOSIS — N182 Chronic kidney disease, stage 2 (mild): Secondary | ICD-10-CM

## 2018-12-09 DIAGNOSIS — I10 Essential (primary) hypertension: Secondary | ICD-10-CM

## 2018-12-09 MED ORDER — SPIRONOLACTONE 25 MG PO TABS: 25.0000 mg | ORAL_TABLET | Freq: Every day | ORAL | 3 refills | Status: DC

## 2018-12-09 MED ORDER — FUROSEMIDE 20 MG PO TABS: 20.0000 mg | ORAL_TABLET | Freq: Every day | ORAL | 5 refills | Status: DC

## 2018-12-09 NOTE — Progress Notes (Signed)
Paramedicine Encounter    Patient ID: SION REINDERS, male    DOB: 1951/01/20, 68 y.o.   MRN: [id number]   Patient Care Team: Medicine, [facility] as PCP - General [name], LCSW as Social Worker (Licensed Clinical Social Worker)  Patient Active Problem List   Diagnosis Date Noted  . COPD GOLD ?  11/24/2018  . Chest pain   . NSTEMI (non-ST elevated myocardial infarction) ([place]) 10/03/2018  . COPD with acute exacerbation ([place]) 07/26/2018  . Weight loss, unintentional 07/26/2018  . Malnutrition of moderate degree 07/16/2017  . 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 Solitary kidney, congenital 06/23/2017  . Nonischemic cardiomyopathy ([place])   . Multifocal PVCs   . Chronic combined systolic (congestive) and diastolic (congestive) heart failure ([place]) 06/11/2017  . DOE (dyspnea on exertion) 07/16/2013  . Alcohol abuse 09/25/2012  . Alcohol dependence ([place]) 09/22/2012  . Essential hypertension 03/22/2007  . DEGENERATIVE DISC DISEASE 03/22/2007  . AVASCULAR NECROSIS 03/22/2007    Current Outpatient Medications:  .  acetaminophen (TYLENOL) 500 MG tablet, Take 2 tablets (1,000 mg total) by mouth 3 (three) times daily as needed for headache (pain)., Disp: 30 tablet, Rfl: 0 .  albuterol (PROVENTIL HFA;VENTOLIN HFA) 108 (90 Base) MCG/ACT inhaler, Inhale 1-2 puffs into the lungs every 4 (four) hours as needed for wheezing or shortness of breath., Disp: 1 Inhaler, Rfl: 0 .  albuterol (PROVENTIL) (2.5 MG/3ML) 0.083% nebulizer solution, Take 3 mLs (2.5 mg total) by nebulization every 6 (six) hours as needed for wheezing or shortness of breath., Disp: 75 mL, Rfl: 0 .  amiodarone (PACERONE) 200 MG tablet, Take 1 tablet (200 mg total) by mouth daily., Disp: 90 tablet, Rfl: 3 .  amLODipine (NORVASC) 5 MG tablet, Take 5 mg by mouth daily., Disp: , Rfl:  .  budesonide-formoterol (SYMBICORT) 160-4.5 MCG/ACT inhaler, Inhale 2 puffs into the lungs 2 (two) times daily.,  Disp: 1 Inhaler, Rfl: 11 .  carvedilol (COREG) 3.125 MG tablet, TAKE 1 TABLET BY MOUTH TWICE DAILY WITH MEALS (Patient taking differently: Take 3.125 mg by mouth 2 (two) times daily with a meal. ), Disp: 60 tablet, Rfl: 5 .  fluticasone (FLONASE) 50 MCG/ACT nasal spray, Place 2 sprays into both nostrils daily., Disp: 16 g, Rfl: 0 .  loratadine (CLARITIN) 10 MG tablet, Take 10 mg by mouth daily., Disp: , Rfl:  .  losartan (COZAAR) 25 MG tablet, Take 1 tablet (25 mg total) by mouth daily., Disp: 30 tablet, Rfl: 6 .  polyethylene glycol powder (GLYCOLAX/MIRALAX) powder, Take 17 g by mouth daily as needed for mild constipation., Disp: , Rfl:  .  dextromethorphan-guaiFENesin (MUCINEX DM) 30-600 MG 12hr tablet, Take 1 tablet by mouth 2 (two) times daily., Disp: 10 tablet, Rfl: 0 .  furosemide (LASIX) 20 MG tablet, Take 1 tablet (20 mg total) by mouth daily., Disp: 30 tablet, Rfl: 5 .  predniSONE (DELTASONE) 10 MG tablet, Take  4 each am x 2 days,   2 each am x 2 days,  1 each am x 2 days and stop, Disp: 14 tablet, Rfl: 0 .  spironolactone (ALDACTONE) 25 MG tablet, Take 1 tablet (25 mg total) by mouth daily., Disp: 30 tablet, Rfl: 3 Allergies  Allergen Reactions  . Lisinopril Swelling    Facial and tongue swelling 10/2012      Social History   Socioeconomic History  . Marital status: Single    Spouse name: Not on file  . Number of  children: Not on file  . Years of education: Not on file  . Highest education level: Not on file  Occupational History  . Occupation: "it don't work for me", on disability  Social Needs  . Financial resource strain: Not on file  . Food insecurity:    Worry: Not on file    Inability: Not on file  . Transportation needs:    Medical: Not on file    Non-medical: Not on file  Tobacco Use  . Smoking status: Former Smoker    Packs/day: 0.10    Years: 45.00    Pack years: 4.50    Last attempt to quit: 2012    Years since quitting: 8.3  . Smokeless tobacco: Never  Used  Substance and Sexual Activity  . Alcohol use: No    Frequency: Never    Comment: Pt states no etoh since April 2014  . Drug use: No    Comment: Prior use of crack cocaine, quit 2012  . Sexual activity: Never  Lifestyle  . Physical activity:    Days per week: Not on file    Minutes per session: Not on file  . Stress: Not on file  Relationships  . Social connections:    Talks on phone: Not on file    Gets together: Not on file    Attends religious service: Not on file    Active member of club or organization: Not on file    Attends meetings of clubs or organizations: Not on file    Relationship status: Not on file  . Intimate partner violence:    Fear of current or ex partner: Not on file    Emotionally abused: Not on file    Physically abused: Not on file    Forced sexual activity: Not on file  Other Topics Concern  . Not on file  Social History Narrative  . Not on file    Physical Exam      Future Appointments  Date Time Provider [place]  12/15/2018  9:10 AM Kerin Perna, NP [facility] None  12/21/2018 11:30 AM [facility] LAB [facility] None  12/27/2018  1:30 PM [facility] PA/NP [facility] None  12/28/2018  1:00 PM [facility] PFT ROOM [facility] None  12/28/2018  2:15 PM Wert, Christena Deem, MD [facility] None    BP (!) 170/82   Pulse (!) 58   Temp (!) 97 F (36.1 C)   Resp 16   Wt 146 lb (66.2 kg)   SpO2 98%   BMI 20.36 kg/m   Weight yesterday-146 Last visit weight-148  Pt reports he is feeling better, his hip and leg pain is better, pt reports his breathing is doing better. He took his meds on empty stomach this morning and it made his stomach upset some, he did eat afterwards and felt better. He reports sleeping ok, he does get tired sometimes. He states he has taken lasix every day this week after being on his prednisone.  Called in carvedilol-he wants [facility] moved to closer [facility] at [facility] So before he confirmed verbally on his carvedilol dose  and taking it twice a day but now he saying he has been taking it only in the morning.  He does have diet indiscretions --out of the can sometimes too.  His lasix is being increased to 20mg  daily and spiro increased to a full tablet. meds verified and pill box refilled---amy will send in new dose to [facility]. I filled it up through Monday and I  will return then.   Marylouise Stacks, [place] Paramedic  12/09/18

## 2018-12-09 NOTE — Telephone Encounter (Signed)
-----   Message from Conrad Marion, NP sent at 12/09/2018 12:22 PM EDT ----- Regarding: avs  Increase spironolactone to 25 mg daily and start lasix 20 mg daily.    Recommended follow-up: Follow up  2 weeks wit AMY virtual with Katie   Next week check  BMET, TSH, LFTs .   Tks A

## 2018-12-09 NOTE — Progress Notes (Signed)
Heart Failure TeleHealth Note  Due to national recommendations of social distancing due to [place] 19, Audio/video telehealth visit is felt to be most appropriate for this patient at this time.  See MyChart message from today for patient consent regarding telehealth for [facility].  Date:  12/09/2018   ID:  Joe Garcia, DOB [birth year], MRN [id number]  Location: Home  Provider location: [place] [facility] Failure Type of Visit: Established patient   PCP:  Medicine, [facility]  Cardiologist:  No primary care provider on file. Primary HF: Dr [name]  Chief Complaint: Heart Failure History of Present Illness: Joe Garcia is a 68 y.o. 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 Former crack cocaine abuse.   Admitted 11/14 - 06/15/17 with CP and DOE. Received steroids and nebs. BNP minimally elevated on arrival. Troponin negative. R/LHC with normal cors and compensated hemodynamics. 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   Seen in ED 09/09/17 with N/V and "chest pain". CP felt like his normal reflux. EKG with PVCs and NSR. Had been seen at home by paramedicine and told HR was in 30s, but normal in 60s on monitor. Likely undercounting due to PVCs. Work up unremarkable.   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   Readmitted with chest pain 10/03/18 with chest pain. Cardiac enzymes minimally elevated but with EKGs he was set up for LHC. 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 ECHO completed and showed EF 30-35% and normal RV function. He was discharged on 10/05/18 with plans for HF follow up.   On April 29th he was started on prednisone by pulmonary for 7 days.     He  presents via Engineer, civil (consulting) for a telehealth visit today.   Overall feeling fine. Mild SOB with exertion. Denies PND/Orthopnea. Appetite ok. No fever or chills. Weight  at home trending up to from 142-->149 pounds.  Taking all medications but he has missed evening dose of carvedilol. He takes the bus to appointments. Followed closely by HF Paramedicine with his pill box set up.   he denies symptoms worrisome for COVID 19.   Past Medical History:  Diagnosis Date  . Alcoholism ([place])   . Arthritis    hips, L shoulder  . Asthma   . CHF (congestive heart failure) ([place])   . Depression   . Family history of anesthesia complication   . Hyperlipidemia   . Hypertension    Past Surgical History:  Procedure Laterality Date  . LEFT HEART CATH AND CORONARY ANGIOGRAPHY N/A 10/04/2018   Procedure: LEFT HEART CATH AND CORONARY ANGIOGRAPHY;  Surgeon: Jettie Booze, MD;  Location: [facility];  Service: Cardiovascular;  Laterality: N/A;  . RIGHT/LEFT HEART CATH AND CORONARY ANGIOGRAPHY N/A 06/12/2017   Procedure: RIGHT/LEFT HEART CATH AND CORONARY ANGIOGRAPHY;  Surgeon: Jolaine Artist, MD;  Location: [place] [facility];  Service: Cardiovascular;  Laterality: N/A;     Current Outpatient Medications  Medication Sig Dispense Refill  . acetaminophen (TYLENOL) 500 MG tablet Take 2 tablets (1,000 mg total) by mouth 3 (three) times daily as needed for headache (pain). 30 tablet 0  . albuterol (PROVENTIL HFA;VENTOLIN HFA) 108 (90 Base) MCG/ACT inhaler Inhale 1-2 puffs into the lungs every 4 (four) hours as needed for wheezing or shortness of breath. 1 Inhaler 0  . albuterol (PROVENTIL) (2.5 MG/3ML) 0.083% nebulizer  solution Take 3 mLs (2.5 mg total) by nebulization every 6 (six) hours as needed for wheezing or shortness of breath. 75 mL 0  . amiodarone (PACERONE) 200 MG tablet Take 1 tablet (200 mg total) by mouth daily. 90 tablet 3  . amLODipine (NORVASC) 5 MG tablet Take 5 mg by mouth daily.    . budesonide-formoterol (SYMBICORT) 160-4.5 MCG/ACT inhaler Inhale 2 puffs into the lungs 2 (two) times daily. 1 Inhaler 11  . carvedilol (COREG) 3.125 MG tablet  TAKE 1 TABLET BY MOUTH TWICE DAILY WITH MEALS (Patient taking differently: Take 3.125 mg by mouth 2 (two) times daily with a meal. ) 60 tablet 5  . dextromethorphan-guaiFENesin (MUCINEX DM) 30-600 MG 12hr tablet Take 1 tablet by mouth 2 (two) times daily. 10 tablet 0  . fluticasone (FLONASE) 50 MCG/ACT nasal spray Place 2 sprays into both nostrils daily. 16 g 0  . furosemide (LASIX) 20 MG tablet Take 1 tablet (20 mg total) by mouth daily as needed (weight gain). Weight gain 30 tablet 5  . loratadine (CLARITIN) 10 MG tablet Take 10 mg by mouth daily.    Marland Kitchen losartan (COZAAR) 25 MG tablet Take 1 tablet (25 mg total) by mouth daily. 30 tablet 6  . polyethylene glycol powder (GLYCOLAX/MIRALAX) powder Take 17 g by mouth daily as needed for mild constipation.    . predniSONE (DELTASONE) 10 MG tablet Take  4 each am x 2 days,   2 each am x 2 days,  1 each am x 2 days and stop 14 tablet 0  . spironolactone (ALDACTONE) 25 MG tablet Take 0.5 tablets (12.5 mg total) by mouth daily. 15 tablet 3   No current facility-administered medications for this encounter.     Allergies:   Lisinopril   Social History:  The patient  reports that he quit smoking about 8 years ago. He has a 4.50 pack-year smoking history. He has never used smokeless tobacco. He reports that he does not drink alcohol or use drugs.   Family History:  The patient's family history includes Asthma in his son; Canavan disease in his daughter; Cancer in his daughter; Heart disease in his daughter and father.   ROS:  Please see the history of present illness.   All other systems are personally reviewed and negative.   Exam:  Video Health Call; Exam is visual. General:  Speaks in full sentences. No resp difficulty. Lungs: Normal respiratory effort with conversation.  Abdomen: Non-distended per patient report Extremities: Pt denies edema. Neuro: Alert & oriented x 3.   Recent Labs: 11/23/2018: TSH 1.08 11/24/2018: ALT 16; B Natriuretic Peptide  146.5; BUN 23; Creatinine, Ser 1.45; Hemoglobin 13.0; Platelets 319; Potassium 4.3; Sodium 136  Personally reviewed   Wt Readings from Last 3 Encounters:  12/09/18 66.2 kg (146 lb)  12/09/18 66.2 kg (146 lb)  11/30/18 67.1 kg (148 lb)      ASSESSMENT AND PLAN:  1.  Chronic Systolic Heart Failure NICM. 09/2018 LHC no CAD. ECHO 09/2018 EF 30-35%. Need to repeat ECHO once HF meds optimized later this summer.  NYHA IIIb. Vplume status elevated.  Instructed to start taking lasix 20 mg daily and increase spironolactone to 25 mg daily.  He was instructed to take lasix 20 mg for weight 150 pounds or greater. Continue current dose of carvedilol and losartan.  If BP high next visit we will stop losartan and start entresto.  Continue HF Paramedic.   2. Former Smoker  Quit 7 years ago.  3. COPD  Inhalers per PCP Completed prednisone pack a few days ago.  PFTs ordered.  4. Low literacy.  Barrier to medication management. Continue HF Parramedicine resumed today. 5. HTN  Elevated. Increase spiro as above.  6.. PVCs On amiodarone 200 mg daily to suppress PVCs since 2018. May need to place zio patch to quantify. Ideally would like to get him off amio with exhisiting COPD.  Will need to watch for amio toxicity existing COPD.  Will need EKG next week.  7. Solitary Kidney  Continue HF Paramedicine.   COVID screen The patient does not have any symptoms that suggest any further testing/ screening at this time.  Social distancing reinforced today.  Patient Risk: After full review of this patients clinical status, I feel that they are at moderate risk for cardiac decompensation at this time.  Relevant cardiac medications were reviewed at length with the patient today. The patient does not have concerns regarding their medications at this time.   The following changes were made today:  Increase spironolactone to 25 mg daily and start lasix 20 mg daily.    Recommended follow-up: Follow up  2 weeks.   Next week check  BMET, TSH, LFTs .  Today, I have spent  73minutes with the patient with telehealth technology discussing the above issues .    Jeanmarie Hubert, NP  12/09/2018 12:09 PM  [place] [address] [facility] and [place] [address] [phone] (office) [phone] (fax)

## 2018-12-09 NOTE — Addendum Note (Signed)
Encounter addended by: Kerry Dory, CMA on: 12/09/2018 12:39 PM  Actions taken: Diagnosis association updated, Order list changed, Clinical Note Signed

## 2018-12-09 NOTE — Patient Instructions (Addendum)
INCREASE Spironolactone to 25 mg, one tab daily CHANGE Lasix to 20 mg, one tab daily  Labs needed in 7-14 days -12/21/2018 @ 1130am  Your physician recommends that you schedule a follow-up appointment in: 2 weeks with Amy Clegg,NP -12/27/2018 @ 130pm   Do the following things EVERYDAY: 1) Weigh yourself in the morning before breakfast. Write it down and keep it in a log. 2) Take your medicines as prescribed 3) Eat low salt foods-Limit salt (sodium) to 2000 mg per day.  4) Stay as active as you can everyday 5) Limit all fluids for the day to less than 2 liters

## 2018-12-09 NOTE — Telephone Encounter (Signed)
1. Medications updated 2. Labs 5/28 virtual follow up 6/1  Attempted to contact patient to verbally review changes, male caller reports patient has gone out, will attempt at a later time.Marland Kitchen  avs mailed

## 2018-12-10 ENCOUNTER — Telehealth (HOSPITAL_COMMUNITY): Payer: Self-pay | Admitting: Licensed Clinical Social Worker

## 2018-12-10 NOTE — Telephone Encounter (Signed)
CSW contacted patient to follow up on weekly food delivery package. Patient informed of delivery time and no face to face contact during delivery. Message left as no answer.  CSW continues to follow for supportive needs. Jackie Emery Dupuy, LCSW, CCSW-MCS [phone] 

## 2018-12-15 ENCOUNTER — Ambulatory Visit: Payer: Medicaid Other | Admitting: Primary Care

## 2018-12-15 NOTE — Telephone Encounter (Signed)
LMOM Attempting to contact patient to review AVS instructions

## 2018-12-16 ENCOUNTER — Telehealth (HOSPITAL_COMMUNITY): Payer: Self-pay | Admitting: Licensed Clinical Social Worker

## 2018-12-16 ENCOUNTER — Telehealth (HOSPITAL_COMMUNITY): Payer: Self-pay | Admitting: Adult Health

## 2018-12-16 ENCOUNTER — Other Ambulatory Visit (HOSPITAL_COMMUNITY): Payer: Self-pay

## 2018-12-16 ENCOUNTER — Other Ambulatory Visit (HOSPITAL_COMMUNITY): Payer: Self-pay | Admitting: Adult Health

## 2018-12-16 MED ORDER — ISOSORB DINITRATE-HYDRALAZINE 20-37.5 MG PO TABS: 1.0000 | ORAL_TABLET | Freq: Three times a day (TID) | ORAL | 1 refills | Status: DC

## 2018-12-16 NOTE — Progress Notes (Signed)
Please order bidil 1 tab tid   Aleysia Oltmann NP-C  4:42 PM

## 2018-12-16 NOTE — Progress Notes (Signed)
Paramedicine Encounter    Patient ID: Joe Garcia, male    DOB: 02/06/1951, 68 y.o.   MRN: [id number]   Patient Care Team: Medicine, [facility] as PCP - General Joe Ny, LCSW as Social Worker (Licensed Clinical Social Worker)  Patient Active Problem List   Diagnosis Date Noted  . COPD GOLD ?  11/24/2018  . Chest pain   . NSTEMI (non-ST elevated myocardial infarction) ([place]) 10/03/2018  . COPD with acute exacerbation ([place]) 07/26/2018  . Weight loss, unintentional 07/26/2018  . Malnutrition of moderate degree 07/16/2017  . 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 Solitary kidney, congenital 06/23/2017  . Nonischemic cardiomyopathy ([place])   . Multifocal PVCs   . Chronic combined systolic (congestive) and diastolic (congestive) heart failure ([place]) 06/11/2017  . DOE (dyspnea on exertion) 07/16/2013  . Alcohol abuse 09/25/2012  . Alcohol dependence ([place]) 09/22/2012  . Essential hypertension 03/22/2007  . DEGENERATIVE DISC DISEASE 03/22/2007  . AVASCULAR NECROSIS 03/22/2007    Current Outpatient Medications:  .  acetaminophen (TYLENOL) 500 MG tablet, Take 2 tablets (1,000 mg total) by mouth 3 (three) times daily as needed for headache (pain)., Disp: 30 tablet, Rfl: 0 .  albuterol (PROVENTIL HFA;VENTOLIN HFA) 108 (90 Base) MCG/ACT inhaler, Inhale 1-2 puffs into the lungs every 4 (four) hours as needed for wheezing or shortness of breath., Disp: 1 Inhaler, Rfl: 0 .  albuterol (PROVENTIL) (2.5 MG/3ML) 0.083% nebulizer solution, Take 3 mLs (2.5 mg total) by nebulization every 6 (six) hours as needed for wheezing or shortness of breath., Disp: 75 mL, Rfl: 0 .  amiodarone (PACERONE) 200 MG tablet, Take 1 tablet (200 mg total) by mouth daily., Disp: 90 tablet, Rfl: 3 .  amLODipine (NORVASC) 5 MG tablet, Take 5 mg by mouth daily., Disp: , Rfl:  .  budesonide-formoterol (SYMBICORT) 160-4.5 MCG/ACT inhaler, Inhale 2 puffs into the lungs 2 (two) times daily.,  Disp: 1 Inhaler, Rfl: 11 .  carvedilol (COREG) 3.125 MG tablet, TAKE 1 TABLET BY MOUTH TWICE DAILY WITH MEALS (Patient taking differently: Take 3.125 mg by mouth 2 (two) times daily with a meal. ), Disp: 60 tablet, Rfl: 5 .  dextromethorphan-guaiFENesin (MUCINEX DM) 30-600 MG 12hr tablet, Take 1 tablet by mouth 2 (two) times daily., Disp: 10 tablet, Rfl: 0 .  fluticasone (FLONASE) 50 MCG/ACT nasal spray, Place 2 sprays into both nostrils daily., Disp: 16 g, Rfl: 0 .  furosemide (LASIX) 20 MG tablet, Take 1 tablet (20 mg total) by mouth daily., Disp: 30 tablet, Rfl: 5 .  loratadine (CLARITIN) 10 MG tablet, Take 10 mg by mouth daily., Disp: , Rfl:  .  losartan (COZAAR) 25 MG tablet, Take 1 tablet (25 mg total) by mouth daily., Disp: 30 tablet, Rfl: 6 .  polyethylene glycol powder (GLYCOLAX/MIRALAX) powder, Take 17 g by mouth daily as needed for mild constipation., Disp: , Rfl:  .  spironolactone (ALDACTONE) 25 MG tablet, Take 1 tablet (25 mg total) by mouth daily., Disp: 30 tablet, Rfl: 3 .  isosorbide-hydrALAZINE (BIDIL) 20-37.5 MG tablet, Take 1 tablet by mouth 3 (three) times daily., Disp: 270 tablet, Rfl: 1 .  predniSONE (DELTASONE) 10 MG tablet, Take  4 each am x 2 days,   2 each am x 2 days,  1 each am x 2 days and stop (Patient not taking: Reported on 12/16/2018), Disp: 14 tablet, Rfl: 0 Allergies  Allergen Reactions  . Lisinopril Swelling    Facial and tongue swelling 10/2012  Social History   Socioeconomic History  . Marital status: Single    Spouse name: Not on file  . Number of children: Not on file  . Years of education: Not on file  . Highest education level: Not on file  Occupational History  . Occupation: "it don't work for me", on disability  Social Needs  . Financial resource strain: Not on file  . Food insecurity:    Worry: Not on file    Inability: Not on file  . Transportation needs:    Medical: Not on file    Non-medical: Not on file  Tobacco Use  . Smoking  status: Former Smoker    Packs/day: 0.10    Years: 45.00    Pack years: 4.50    Last attempt to quit: 2012    Years since quitting: 8.3  . Smokeless tobacco: Never Used  Substance and Sexual Activity  . Alcohol use: No    Frequency: Never    Comment: Pt states no etoh since April 2014  . Drug use: No    Comment: Prior use of crack cocaine, quit 2012  . Sexual activity: Never  Lifestyle  . Physical activity:    Days per week: Not on file    Minutes per session: Not on file  . Stress: Not on file  Relationships  . Social connections:    Talks on phone: Not on file    Gets together: Not on file    Attends religious service: Not on file    Active member of club or organization: Not on file    Attends meetings of clubs or organizations: Not on file    Relationship status: Not on file  . Intimate partner violence:    Fear of current or ex partner: Not on file    Emotionally abused: Not on file    Physically abused: Not on file    Forced sexual activity: Not on file  Other Topics Concern  . Not on file  Social History Narrative  . Not on file    Physical Exam      Future Appointments  Date Time Provider [place]  12/21/2018 11:30 AM [facility] LAB [facility] None  12/27/2018  1:30 PM [facility] PA/NP [facility] None  12/28/2018  1:00 PM [facility] PFT ROOM [facility] None  12/28/2018  2:15 PM Garcia, Joe Deem, MD [facility] None    BP (!) 154/80   Pulse (!) 56   Temp (!) 97.5 F (36.4 C)   Resp 15   Wt 148 lb (67.1 kg)   SpO2 98%   BMI 20.64 kg/m   Weight yesterday-148 Last visit weight-146  Pt reports feeling well except for past 2 days with a h/a-he states it is his sinus. No relief with tylenol. He states he has pain to his nose and under eye area. Pt denies increased sob.no c/p.  Pillbox refilled. Pt denies missed doses of his meds.  Pt states his b/p is elevated due to him eating too much food. No edema noted. Contacted amy--she is going to add bidil one  tablet TID.  He missed his phone visit yesterday, tried to call them back but was on hold so long he just had to hang up.  Pt will go pick it up and I will be back on Monday to ensure he was able to get it.   Joe Garcia, [facility] Paramedic  12/16/18

## 2018-12-16 NOTE — Telephone Encounter (Signed)
CSW contacted patient to follow up on weekly food delivery package. Patient informed of change in delivery day due to Memorial Day to Wednesday and no face to face contact during delivery. Patient verbalizes understanding and grateful for the assistance.  CSW continues to follow for supportive needs. Jackie Perry [place], LCSW, CCSW-MCS [phone]  

## 2018-12-16 NOTE — Telephone Encounter (Signed)
  Called by Marylouise Stacks HF Paramedic  BP remains elevated today. 154 /80 . No lower extremity edema.   He has been taking his medications.   Add bidil 1 tablets  three times a day. New order sent to his pharmacy.   Joellen Jersey will follow up next week to reassess.   Evian Derringer NP-C  4:39 PM

## 2018-12-20 ENCOUNTER — Other Ambulatory Visit (HOSPITAL_COMMUNITY): Payer: Self-pay

## 2018-12-20 NOTE — Progress Notes (Signed)
Paramedicine Encounter    Patient ID: Joe Garcia, male    DOB: 01/01/1951, 68 y.o.   MRN: [id number]   Patient Care Team: Medicine, [facility] as PCP - General [name], LCSW as Social Worker (Licensed Clinical Social Worker)  Patient Active Problem List   Diagnosis Date Noted  . COPD GOLD ?  11/24/2018  . Chest pain   . NSTEMI (non-ST elevated myocardial infarction) ([place]) 10/03/2018  . COPD with acute exacerbation ([place]) 07/26/2018  . Weight loss, unintentional 07/26/2018  . Malnutrition of moderate degree 07/16/2017  . 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 Solitary kidney, congenital 06/23/2017  . Nonischemic cardiomyopathy ([place])   . Multifocal PVCs   . Chronic combined systolic (congestive) and diastolic (congestive) heart failure ([place]) 06/11/2017  . DOE (dyspnea on exertion) 07/16/2013  . Alcohol abuse 09/25/2012  . Alcohol dependence ([place]) 09/22/2012  . Essential hypertension 03/22/2007  . DEGENERATIVE DISC DISEASE 03/22/2007  . AVASCULAR NECROSIS 03/22/2007    Current Outpatient Medications:  .  acetaminophen (TYLENOL) 500 MG tablet, Take 2 tablets (1,000 mg total) by mouth 3 (three) times daily as needed for headache (pain)., Disp: 30 tablet, Rfl: 0 .  albuterol (PROVENTIL HFA;VENTOLIN HFA) 108 (90 Base) MCG/ACT inhaler, Inhale 1-2 puffs into the lungs every 4 (four) hours as needed for wheezing or shortness of breath., Disp: 1 Inhaler, Rfl: 0 .  albuterol (PROVENTIL) (2.5 MG/3ML) 0.083% nebulizer solution, Take 3 mLs (2.5 mg total) by nebulization every 6 (six) hours as needed for wheezing or shortness of breath., Disp: 75 mL, Rfl: 0 .  amiodarone (PACERONE) 200 MG tablet, Take 1 tablet (200 mg total) by mouth daily., Disp: 90 tablet, Rfl: 3 .  amLODipine (NORVASC) 5 MG tablet, Take 5 mg by mouth daily., Disp: , Rfl:  .  budesonide-formoterol (SYMBICORT) 160-4.5 MCG/ACT inhaler, Inhale 2 puffs into the lungs 2 (two) times daily.,  Disp: 1 Inhaler, Rfl: 11 .  carvedilol (COREG) 3.125 MG tablet, TAKE 1 TABLET BY MOUTH TWICE DAILY WITH MEALS (Patient taking differently: Take 3.125 mg by mouth 2 (two) times daily with a meal. ), Disp: 60 tablet, Rfl: 5 .  dextromethorphan-guaiFENesin (MUCINEX DM) 30-600 MG 12hr tablet, Take 1 tablet by mouth 2 (two) times daily., Disp: 10 tablet, Rfl: 0 .  fluticasone (FLONASE) 50 MCG/ACT nasal spray, Place 2 sprays into both nostrils daily., Disp: 16 g, Rfl: 0 .  furosemide (LASIX) 20 MG tablet, Take 1 tablet (20 mg total) by mouth daily., Disp: 30 tablet, Rfl: 5 .  isosorbide-hydrALAZINE (BIDIL) 20-37.5 MG tablet, Take 1 tablet by mouth 3 (three) times daily., Disp: 270 tablet, Rfl: 1 .  loratadine (CLARITIN) 10 MG tablet, Take 10 mg by mouth daily., Disp: , Rfl:  .  losartan (COZAAR) 25 MG tablet, Take 1 tablet (25 mg total) by mouth daily., Disp: 30 tablet, Rfl: 6 .  polyethylene glycol powder (GLYCOLAX/MIRALAX) powder, Take 17 g by mouth daily as needed for mild constipation., Disp: , Rfl:  .  spironolactone (ALDACTONE) 25 MG tablet, Take 1 tablet (25 mg total) by mouth daily., Disp: 30 tablet, Rfl: 3 .  predniSONE (DELTASONE) 10 MG tablet, Take  4 each am x 2 days,   2 each am x 2 days,  1 each am x 2 days and stop (Patient not taking: Reported on 12/16/2018), Disp: 14 tablet, Rfl: 0 Allergies  Allergen Reactions  . Lisinopril Swelling    Facial and tongue swelling 10/2012  Social History   Socioeconomic History  . Marital status: Single    Spouse name: Not on file  . Number of children: Not on file  . Years of education: Not on file  . Highest education level: Not on file  Occupational History  . Occupation: "it don't work for me", on disability  Social Needs  . Financial resource strain: Not on file  . Food insecurity:    Worry: Not on file    Inability: Not on file  . Transportation needs:    Medical: Not on file    Non-medical: Not on file  Tobacco Use  . Smoking  status: Former Smoker    Packs/day: 0.10    Years: 45.00    Pack years: 4.50    Last attempt to quit: 2012    Years since quitting: 8.4  . Smokeless tobacco: Never Used  Substance and Sexual Activity  . Alcohol use: No    Frequency: Never    Comment: Pt states no etoh since April 2014  . Drug use: No    Comment: Prior use of crack cocaine, quit 2012  . Sexual activity: Never  Lifestyle  . Physical activity:    Days per week: Not on file    Minutes per session: Not on file  . Stress: Not on file  Relationships  . Social connections:    Talks on phone: Not on file    Gets together: Not on file    Attends religious service: Not on file    Active member of club or organization: Not on file    Attends meetings of clubs or organizations: Not on file    Relationship status: Not on file  . Intimate partner violence:    Fear of current or ex partner: Not on file    Emotionally abused: Not on file    Physically abused: Not on file    Forced sexual activity: Not on file  Other Topics Concern  . Not on file  Social History Narrative  . Not on file    Physical Exam      Future Appointments  Date Time Provider [place]  12/21/2018 11:30 AM [facility] LAB [facility] None  12/27/2018  1:30 PM [facility] PA/NP [facility] None  12/28/2018  1:00 PM [facility] PFT ROOM [facility] None  12/28/2018  2:15 PM Wert, Christena Deem, MD [facility] None    BP (!) 160/78   Pulse (!) 58   Temp (!) 97.2 F (36.2 C)   Resp 15   Wt 149 lb (67.6 kg)   SpO2 98%   BMI 20.78 kg/m  B/p pta-136/63 per his home machine--prior readings were 829/ systolic to 937 systolic  Weight [id number] Last visit weight-148  Pt reports he did not start the bidil as he remembered last time he took it, it made him very dizzy. That dose then was 2 tab TID, he states it got decreased to 1/2 tab TID and he still got very dizzy upon taking it. Looking back his b/p was 122/76 at that time. His b/p still elevated  today, he did agree to trying it again. i advised him should he start feeling bad then to check his b/p at home and I could also come out to recheck it.  So that was placed in pill box as well.  meds verified and pill box refilled. No missed doses of his meds since I filled it up from last Thursday.  He denies increased sob, he was able to walk  to store and back up the hill without difficulty. Good urine output. Eating ok. Trying to watch his sodium intake.  He states he is drinking under 2L. No edema noted.   Marylouise Stacks, [facility] Paramedic  12/20/18

## 2018-12-21 ENCOUNTER — Ambulatory Visit (HOSPITAL_COMMUNITY)
Admission: RE | Admit: 2018-12-21 | Discharge: 2018-12-21 | Disposition: A | Discharge status: Home / Self Care | Payer: Medicare Other | Source: Ambulatory Visit | Attending: Cardiology | Admitting: Cardiology

## 2018-12-21 ENCOUNTER — Other Ambulatory Visit: Payer: Self-pay

## 2018-12-21 DIAGNOSIS — I5042 Chronic combined systolic (congestive) and diastolic (congestive) heart failure: Secondary | ICD-10-CM | POA: Insufficient documentation

## 2018-12-21 LAB — BASIC METABOLIC PANEL
Anion gap: 11 (ref 5–15)
BUN: 25 mg/dL — ABNORMAL HIGH (ref 8–23)
CO2: 22 mmol/L (ref 22–32)
Calcium: 9.4 mg/dL (ref 8.9–10.3)
Chloride: 103 mmol/L (ref 98–111)
Creatinine, Ser: 1.76 mg/dL — ABNORMAL HIGH (ref 0.61–1.24)
GFR calc Af Amer: 46 mL/min — ABNORMAL LOW (ref >60)
GFR calc non Af Amer: 40 mL/min — ABNORMAL LOW (ref >60)
Glucose, Bld: 91 mg/dL (ref 70–99)
Potassium: 4.8 mmol/L (ref 3.5–5.1)
Sodium: 136 mmol/L (ref 135–145)

## 2018-12-21 LAB — HEPATIC FUNCTION PANEL
ALT: 19 U/L (ref 0–44)
AST: 17 U/L (ref 15–41)
Albumin: 3.9 g/dL (ref 3.5–5.0)
Alkaline Phosphatase: 91 U/L (ref 38–126)
Bilirubin, Direct: <0.1 mg/dL (ref 0.0–0.2)
Total Bilirubin: 0.4 mg/dL (ref 0.3–1.2)
Total Protein: 7.1 g/dL (ref 6.5–8.1)

## 2018-12-21 LAB — TSH: TSH: 2.114 u[IU]/mL (ref 0.350–4.500)

## 2018-12-24 ENCOUNTER — Telehealth (HOSPITAL_COMMUNITY): Payer: Self-pay | Admitting: Licensed Clinical Social Worker

## 2018-12-24 NOTE — Telephone Encounter (Signed)
CSW contacted patient to follow up on weekly food delivery package. Patient informed of delivery time and no face to face contact during delivery. CSW shared transition option for food delivery as the Covid 19 Food relief program will be ending on January 07, 2019. Message left as no answer.  CSW continues to follow for supportive needs. Jackie Leata Dominy, LCSW, CCSW-MCS [phone] 

## 2018-12-27 ENCOUNTER — Ambulatory Visit (HOSPITAL_COMMUNITY)
Admission: RE | Admit: 2018-12-27 | Discharge: 2018-12-27 | Disposition: A | Discharge status: Home / Self Care | Payer: Medicare Other | Source: Ambulatory Visit | Attending: Internal Medicine | Admitting: Internal Medicine

## 2018-12-27 ENCOUNTER — Other Ambulatory Visit (HOSPITAL_COMMUNITY): Payer: Self-pay

## 2018-12-27 ENCOUNTER — Encounter (HOSPITAL_COMMUNITY): Payer: Self-pay

## 2018-12-27 ENCOUNTER — Other Ambulatory Visit: Payer: Self-pay

## 2018-12-27 VITALS — BP 142/78 | HR 56 | Wt 154.0 lb

## 2018-12-27 DIAGNOSIS — N182 Chronic kidney disease, stage 2 (mild): Secondary | ICD-10-CM

## 2018-12-27 DIAGNOSIS — I5042 Chronic combined systolic (congestive) and diastolic (congestive) heart failure: Secondary | ICD-10-CM

## 2018-12-27 DIAGNOSIS — I493 Ventricular premature depolarization: Secondary | ICD-10-CM

## 2018-12-27 DIAGNOSIS — I428 Other cardiomyopathies: Secondary | ICD-10-CM

## 2018-12-27 DIAGNOSIS — Z550 Illiteracy and low-level literacy: Secondary | ICD-10-CM | POA: Diagnosis not present

## 2018-12-27 DIAGNOSIS — Q600 Renal agenesis, unilateral: Secondary | ICD-10-CM

## 2018-12-27 NOTE — Progress Notes (Signed)
Paramedicine Encounter    Patient ID: Joe Garcia, male    DOB: [birth year], 68 y.o.   MRN: [id number]   Patient Care Team: Medicine, [facility] as PCP - General [name], LCSW as Social Worker (Licensed Clinical Social Worker)  Patient Active Problem List   Diagnosis Date Noted  . COPD GOLD ?  11/24/2018  . Chest pain   . NSTEMI (non-ST elevated myocardial infarction) ([place]) 10/03/2018  . COPD with acute exacerbation ([place]) 07/26/2018  . Weight loss, unintentional 07/26/2018  . Malnutrition of moderate degree 07/16/2017  . 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 Solitary kidney, congenital 06/23/2017  . Nonischemic cardiomyopathy ([place])   . Multifocal PVCs   . Chronic combined systolic (congestive) and diastolic (congestive) heart failure ([place]) 06/11/2017  . DOE (dyspnea on exertion) 07/16/2013  . Alcohol abuse 09/25/2012  . Alcohol dependence ([place]) 09/22/2012  . Essential hypertension 03/22/2007  . DEGENERATIVE DISC DISEASE 03/22/2007  . AVASCULAR NECROSIS 03/22/2007    Current Outpatient Medications:  .  acetaminophen (TYLENOL) 500 MG tablet, Take 2 tablets (1,000 mg total) by mouth 3 (three) times daily as needed for headache (pain)., Disp: 30 tablet, Rfl: 0 .  albuterol (PROVENTIL HFA;VENTOLIN HFA) 108 (90 Base) MCG/ACT inhaler, Inhale 1-2 puffs into the lungs every 4 (four) hours as needed for wheezing or shortness of breath., Disp: 1 Inhaler, Rfl: 0 .  albuterol (PROVENTIL) (2.5 MG/3ML) 0.083% nebulizer solution, Take 3 mLs (2.5 mg total) by nebulization every 6 (six) hours as needed for wheezing or shortness of breath., Disp: 75 mL, Rfl: 0 .  amiodarone (PACERONE) 200 MG tablet, Take 1 tablet (200 mg total) by mouth daily., Disp: 90 tablet, Rfl: 3 .  amLODipine (NORVASC) 5 MG tablet, Take 5 mg by mouth daily., Disp: , Rfl:  .  budesonide-formoterol (SYMBICORT) 160-4.5 MCG/ACT inhaler, Inhale 2 puffs into the lungs 2 (two) times daily.,  Disp: 1 Inhaler, Rfl: 11 .  carvedilol (COREG) 3.125 MG tablet, TAKE 1 TABLET BY MOUTH TWICE DAILY WITH MEALS, Disp: 60 tablet, Rfl: 5 .  fluticasone (FLONASE) 50 MCG/ACT nasal spray, Place 2 sprays into both nostrils daily., Disp: 16 g, Rfl: 0 .  furosemide (LASIX) 20 MG tablet, Take 1 tablet (20 mg total) by mouth daily., Disp: 30 tablet, Rfl: 5 .  isosorbide-hydrALAZINE (BIDIL) 20-37.5 MG tablet, Take 1 tablet by mouth 3 (three) times daily. (Patient taking differently: Take 0.5 tablets by mouth 2 (two) times a day. ), Disp: 270 tablet, Rfl: 1 .  loratadine (CLARITIN) 10 MG tablet, Take 10 mg by mouth daily., Disp: , Rfl:  .  losartan (COZAAR) 25 MG tablet, Take 1 tablet (25 mg total) by mouth daily., Disp: 30 tablet, Rfl: 6 .  polyethylene glycol powder (GLYCOLAX/MIRALAX) powder, Take 17 g by mouth daily as needed for mild constipation., Disp: , Rfl:  .  spironolactone (ALDACTONE) 25 MG tablet, Take 1 tablet (25 mg total) by mouth daily., Disp: 30 tablet, Rfl: 3 .  dextromethorphan-guaiFENesin (MUCINEX DM) 30-600 MG 12hr tablet, Take 1 tablet by mouth 2 (two) times daily., Disp: 10 tablet, Rfl: 0 Allergies  Allergen Reactions  . Lisinopril Swelling    Facial and tongue swelling 10/2012      Social History   Socioeconomic History  . Marital status: Single    Spouse name: Not on file  . Number of children: Not on file  . Years of education: Not on file  . Highest education level: Not  on file  Occupational History  . Occupation: "it don't work for me", on disability  Social Needs  . Financial resource strain: Not on file  . Food insecurity:    Worry: Not on file    Inability: Not on file  . Transportation needs:    Medical: Not on file    Non-medical: Not on file  Tobacco Use  . Smoking status: Former Smoker    Packs/day: 0.10    Years: 45.00    Pack years: 4.50    Last attempt to quit: 2012    Years since quitting: 8.4  . Smokeless tobacco: Never Used  Substance and  Sexual Activity  . Alcohol use: No    Frequency: Never    Comment: Pt states no etoh since April 2014  . Drug use: No    Comment: Prior use of crack cocaine, quit 2012  . Sexual activity: Never  Lifestyle  . Physical activity:    Days per week: Not on file    Minutes per session: Not on file  . Stress: Not on file  Relationships  . Social connections:    Talks on phone: Not on file    Gets together: Not on file    Attends religious service: Not on file    Active member of club or organization: Not on file    Attends meetings of clubs or organizations: Not on file    Relationship status: Not on file  . Intimate partner violence:    Fear of current or ex partner: Not on file    Emotionally abused: Not on file    Physically abused: Not on file    Forced sexual activity: Not on file  Other Topics Concern  . Not on file  Social History Narrative  . Not on file    Physical Exam      No future appointments.  BP (!) 142/78   Pulse (!) 56   Temp (!) 97.3 F (36.3 C)   Resp 15   Wt 154 lb (69.9 kg)   SpO2 98%   BMI 21.48 kg/m   Weight yesterday-not sure Last visit weight-149   Pt reports he is doing ok. He is not able to tolerate the bidil TID-he tried to cut back to twice a day a whole tablet and then he states half tab BID is working out ok when he takes it in the morning and then at bedtime.  He states it was making him too dizzy and feeling bad.  Also it sounds like he is taking it depending on what his home b/p readings are-which have still been elevated in the 140's.   He needs refills on [id number]                                [id number]   Pt needs eye exam so he needs help getting that set up.   Marylouise Stacks, [facility] Paramedic  12/27/18

## 2018-12-27 NOTE — Progress Notes (Signed)
Heart Failure TeleHealth Note  Due to national recommendations of social distancing due to [place] 19, Audio/video telehealth visit is felt to be most appropriate for this patient at this time.  See MyChart message from today for patient consent regarding telehealth for Joe Garcia.  Date:  12/27/2018   ID:  Joe Garcia, DOB Jan 24, 1951, MRN [id number]  Location: Home  Provider location: [facility] Type of Visit: Established patient   PCP:  Medicine, [facility]  Cardiologist:  No primary care provider on file. Primary HF: Dr [name].   Chief Complaint: Heart Failure.    History of Present Illness: Joe Garcia is a 68 y.o. male with a history of  systolic CHF due to NICM, HTN, HLD, Asthma, and solitary kidney. Former crack cocaine abuse.   Admitted 11/14 - 06/15/17 with CP and DOE. Received steroids and nebs. BNP minimally elevated on arrival. Troponin negative. R/LHC with normal cors and compensated hemodynamics. 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   Seen in ED 09/09/17 with N/V and "chest pain". CP felt like his normal reflux. EKG with PVCs and NSR. Had been seen at home by paramedicine and told HR was in 30s, but normal in 60s on monitor. Likely undercounting due to PVCs. Work up unremarkable.   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   Readmitted with chest pain 10/03/18 with chest pain. Cardiac enzymes minimally elevated but with EKGs he was set up for LHC. 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 ECHO completed and showed EF 30-35% and normal RV function. He was discharged on 10/05/18 with plans for HF follow up.  On April 29th he was started on prednisone by pulmonary for 7 days.  He presents via Administrator for a telehealth visit today.  Last week he was started on bidil 1 tab three times a day but due to headache he cut back to 1/2 tablet 2 times  a day.Says he can is able to take bidil at a lower dose.  Overall feeling fine. SOB with exertion but says this is his baseline.  Denies PND/Orthopnea. Says he has a big appetite.  No fever or chills. Weight at home 149-154 pounds. Taking all medications. Followed closely by HF Paramedicine.   he denies symptoms worrisome for COVID 19.   ECHO 10/04/2018 EF 30-35%.  The cavity size was mildly dilated. There is no increase in left ventricular wall thickness. Left ventricular diastolic Doppler  parameters are consistent with impaired relaxation. Right Ventricle: The right ventricle has normal systolic function. The cavity was normal. There is no increase in right ventricular wall thickness. Left Atrium: left atrial size was mildly dilated Right Atrium: right atrial size was normal in size. Right atrial pressure is estimated at 3 mmHg. Interatrial Septum: No atrial level shunt detected by color flow Doppler. Pericardium: There is no evidence of pericardial effusion. Mitral Valve: The mitral valve is normal in structure. Mitral valve regurgitation is mild by color flow Doppler. Tricuspid Valve: The tricuspid valve is normal in structure. Tricuspid valve regurgitation is trivial by color flow Doppler. Aortic Valve: The aortic valve is tricuspid Mild calcification of the aortic valve. Aortic valve regurgitation is trivial by color flow Doppler. There is no stenosis of the aortic valve. Pulmonic Valve: The pulmonic valve was normal in structure. Pulmonic valve regurgitation is trivial by color flow Doppler. Aorta: The aortic root and ascending aorta are normal  in size and structure. Venous: The inferior vena cava is normal in size with greater than 50% respiratory variability.     Past Medical History:  Diagnosis Date   Alcoholism ([place])    Arthritis    hips, L shoulder   Asthma    CHF (congestive heart failure) (HCC)    Depression    Family history of anesthesia complication     Hyperlipidemia    Hypertension    Past Surgical History:  Procedure Laterality Date   LEFT HEART CATH AND CORONARY ANGIOGRAPHY N/A 10/04/2018   Procedure: LEFT HEART CATH AND CORONARY ANGIOGRAPHY;  Surgeon: Jettie Booze, MD;  Location: [facility];  Service: Cardiovascular;  Laterality: N/A;   RIGHT/LEFT HEART CATH AND CORONARY ANGIOGRAPHY N/A 06/12/2017   Procedure: RIGHT/LEFT HEART CATH AND CORONARY ANGIOGRAPHY;  Surgeon: Jolaine Artist, MD;  Location: [facility];  Service: Cardiovascular;  Laterality: N/A;     Current Outpatient Medications  Medication Sig Dispense Refill   acetaminophen (TYLENOL) 500 MG tablet Take 2 tablets (1,000 mg total) by mouth 3 (three) times daily as needed for headache (pain). 30 tablet 0   albuterol (PROVENTIL HFA;VENTOLIN HFA) 108 (90 Base) MCG/ACT inhaler Inhale 1-2 puffs into the lungs every 4 (four) hours as needed for wheezing or shortness of breath. 1 Inhaler 0   albuterol (PROVENTIL) (2.5 MG/3ML) 0.083% nebulizer solution Take 3 mLs (2.5 mg total) by nebulization every 6 (six) hours as needed for wheezing or shortness of breath. 75 mL 0   amiodarone (PACERONE) 200 MG tablet Take 1 tablet (200 mg total) by mouth daily. 90 tablet 3   amLODipine (NORVASC) 5 MG tablet Take 5 mg by mouth daily.     budesonide-formoterol (SYMBICORT) 160-4.5 MCG/ACT inhaler Inhale 2 puffs into the lungs 2 (two) times daily. 1 Inhaler 11   carvedilol (COREG) 3.125 MG tablet TAKE 1 TABLET BY MOUTH TWICE DAILY WITH MEALS 60 tablet 5   dextromethorphan-guaiFENesin (MUCINEX DM) 30-600 MG 12hr tablet Take 1 tablet by mouth 2 (two) times daily. 10 tablet 0   fluticasone (FLONASE) 50 MCG/ACT nasal spray Place 2 sprays into both nostrils daily. 16 g 0   furosemide (LASIX) 20 MG tablet Take 1 tablet (20 mg total) by mouth daily. 30 tablet 5   isosorbide-hydrALAZINE (BIDIL) 20-37.5 MG tablet Take 1 tablet by mouth 3 (three) times daily. (Patient  taking differently: Take 0.5 tablets by mouth 2 (two) times a day. ) 270 tablet 1   loratadine (CLARITIN) 10 MG tablet Take 10 mg by mouth daily.     losartan (COZAAR) 25 MG tablet Take 1 tablet (25 mg total) by mouth daily. 30 tablet 6   polyethylene glycol powder (GLYCOLAX/MIRALAX) powder Take 17 g by mouth daily as needed for mild constipation.     spironolactone (ALDACTONE) 25 MG tablet Take 1 tablet (25 mg total) by mouth daily. 30 tablet 3   No current facility-administered medications for this encounter.     Allergies:   Lisinopril   Social History:  The patient  reports that he quit smoking about 8 years ago. He has a 4.50 pack-year smoking history. He has never used smokeless tobacco. He reports that he does not drink alcohol or use drugs.   Family History:  The patient's family history includes Asthma in his son; Canavan disease in his daughter; Cancer in his daughter; Heart disease in his daughter and father.   ROS:  Please see the history of present illness.  All other systems are personally reviewed and negative.  Vitals:   12/27/18 1332  BP: (!) 142/78  Pulse: (!) 56  SpO2: 98%   Exam:  Video; Exam is visual. HF Paramedicine   General:  Speaks in full sentences. No resp difficulty. Lungs: Normal respiratory effort with conversation.  Abdomen: Non-distended per patient report Extremities: Pt denies edema. Neuro: Alert & oriented x 3.   Recent Labs: 11/24/2018: B Natriuretic Peptide 146.5; Hemoglobin 13.0; Platelets 319 12/21/2018: ALT 19; BUN 25; Creatinine, Ser 1.76; Potassium 4.8; Sodium 136; TSH 2.114  Personally reviewed   Wt Readings from Last 3 Encounters:  12/27/18 69.9 kg (154 lb)  12/27/18 69.9 kg (154 lb)  12/20/18 67.6 kg (149 lb)      ASSESSMENT AND PLAN:  1. Chronic Systolic Heart Failure NICM. 09/2018 LHC no CAD. ECHO 09/2018 EF 30-35%. Repeat ECHO in 3 months.  NYHA IIIb. Volume status stable. Continue 20 mg lasix daily.  Continue  spironolactone to 25 mg daily.  Continue bidil 1/2 tab twice a day.  Continue carvedilol 3.125 mg twice a day. Will not increase with heart rate of 56.  Continue current dose of carvedilol and losartan. Not on entresto due to angioedema noted on lisinopril.  Continue HF Paramedic. 2. Former Smoker  Quit 7 years ago.  3. COPD  Inhalers per PCP PFTs ordered. 4. Low literacy.  Barrier to medication management. Continue HF Parramedicineresumed today. 5. HTN  Improved. Continue current regimen.  6.. PVCs On amiodarone 200 mg daily to suppress PVCs since 2018. May need to place zio patch to quantify. Ideally would like to get him off amio with exhisiting COPD.  Will need to watch for amio toxicity existing COPD. Next visit will check LFTs, TSH. Discussed that he needs to obtain yearly eye exam.   7. Solitary Kidney   COVID screen The patient does not have any symptoms that suggest any further testing/ screening at this time.  Social distancing reinforced today.  Patient Risk: After full review of this patients clinical status, I feel that they are at moderate risk for cardiac decompensation at this time.  Relevant cardiac medications were reviewed at length with the patient today. The patient does not have concerns regarding their medications at this time.   The following changes were made today:  No change Recommended follow-up:  3 months with Dr [name] and an ECHO. Plan to check BMET, TSH, and LFTs at that time.     Today, I have spent 15 minutes with the patient with telehealth technology discussing the above issues .    Jeanmarie Hubert, NP  12/27/2018 1:35 PM  [facility] [address] [facility] [address] [phone] (office) [phone] (fax)

## 2018-12-27 NOTE — Addendum Note (Signed)
Encounter addended by: Jovita Kussmaul, RN on: 12/27/2018 2:17 PM  Actions taken: Order list changed, Diagnosis association updated

## 2018-12-28 ENCOUNTER — Ambulatory Visit: Payer: Medicaid Other | Admitting: Internal Medicine

## 2018-12-29 ENCOUNTER — Telehealth (HOSPITAL_COMMUNITY): Payer: Self-pay | Admitting: Licensed Clinical Social Worker

## 2018-12-29 NOTE — Telephone Encounter (Signed)
CSW informed by [employer] that pt is in need of an eye exam.  CSW able to set up patient with appt at Dr. Barkley Boards office for July 3rd at [place] informed pt and pt daughter of appt.  CSW will continue to follow and assist as needed  [name], [facility] Desk#: [phone] Cell#: [phone]

## 2018-12-30 ENCOUNTER — Telehealth (HOSPITAL_COMMUNITY): Payer: Self-pay | Admitting: Licensed Clinical Social Worker

## 2018-12-30 NOTE — Telephone Encounter (Signed)
CSW contacted patient to follow up on weekly food delivery package. Patient informed of delivery time and no face to face contact during delivery. CSW shared transition option for food delivery as the Covid 19 Food relief program will be ending on January 03, 2019. Message left as no answer.  CSW continues to follow for supportive needs. Jackie Nathanyel Defenbaugh, LCSW, CCSW-MCS [phone] 

## 2019-01-03 ENCOUNTER — Other Ambulatory Visit (HOSPITAL_COMMUNITY): Payer: Self-pay

## 2019-01-03 NOTE — Progress Notes (Signed)
Paramedicine Encounter    Patient ID: Joe Garcia, male    DOB: 01-09-1951, 68 y.o.   MRN: [id number]   Patient Care Team: Medicine, [facility] as PCP - General [name], LCSW as Social Worker (Licensed Clinical Social Worker)  Patient Active Problem List   Diagnosis Date Noted  . COPD GOLD ?  11/24/2018  . Chest pain   . NSTEMI (non-ST elevated myocardial infarction) ([place]) 10/03/2018  . COPD with acute exacerbation ([place]) 07/26/2018  . Weight loss, unintentional 07/26/2018  . Malnutrition of moderate degree 07/16/2017  . 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 Solitary kidney, congenital 06/23/2017  . Nonischemic cardiomyopathy ([place])   . Multifocal PVCs   . Chronic combined systolic (congestive) and diastolic (congestive) heart failure ([place]) 06/11/2017  . DOE (dyspnea on exertion) 07/16/2013  . Alcohol abuse 09/25/2012  . Alcohol dependence ([place]) 09/22/2012  . Essential hypertension 03/22/2007  . DEGENERATIVE DISC DISEASE 03/22/2007  . AVASCULAR NECROSIS 03/22/2007    Current Outpatient Medications:  .  acetaminophen (TYLENOL) 500 MG tablet, Take 2 tablets (1,000 mg total) by mouth 3 (three) times daily as needed for headache (pain)., Disp: 30 tablet, Rfl: 0 .  albuterol (PROVENTIL HFA;VENTOLIN HFA) 108 (90 Base) MCG/ACT inhaler, Inhale 1-2 puffs into the lungs every 4 (four) hours as needed for wheezing or shortness of breath., Disp: 1 Inhaler, Rfl: 0 .  albuterol (PROVENTIL) (2.5 MG/3ML) 0.083% nebulizer solution, Take 3 mLs (2.5 mg total) by nebulization every 6 (six) hours as needed for wheezing or shortness of breath., Disp: 75 mL, Rfl: 0 .  amiodarone (PACERONE) 200 MG tablet, Take 1 tablet (200 mg total) by mouth daily., Disp: 90 tablet, Rfl: 3 .  amLODipine (NORVASC) 5 MG tablet, Take 5 mg by mouth daily., Disp: , Rfl:  .  budesonide-formoterol (SYMBICORT) 160-4.5 MCG/ACT inhaler, Inhale 2 puffs into the lungs 2 (two) times daily.,  Disp: 1 Inhaler, Rfl: 11 .  carvedilol (COREG) 3.125 MG tablet, TAKE 1 TABLET BY MOUTH TWICE DAILY WITH MEALS, Disp: 60 tablet, Rfl: 5 .  dextromethorphan-guaiFENesin (MUCINEX DM) 30-600 MG 12hr tablet, Take 1 tablet by mouth 2 (two) times daily., Disp: 10 tablet, Rfl: 0 .  fluticasone (FLONASE) 50 MCG/ACT nasal spray, Place 2 sprays into both nostrils daily., Disp: 16 g, Rfl: 0 .  furosemide (LASIX) 20 MG tablet, Take 1 tablet (20 mg total) by mouth daily., Disp: 30 tablet, Rfl: 5 .  isosorbide-hydrALAZINE (BIDIL) 20-37.5 MG tablet, Take 1 tablet by mouth 3 (three) times daily. (Patient taking differently: Take 0.5 tablets by mouth 2 (two) times a day. ), Disp: 270 tablet, Rfl: 1 .  loratadine (CLARITIN) 10 MG tablet, Take 10 mg by mouth daily., Disp: , Rfl:  .  losartan (COZAAR) 25 MG tablet, Take 1 tablet (25 mg total) by mouth daily., Disp: 30 tablet, Rfl: 6 .  polyethylene glycol powder (GLYCOLAX/MIRALAX) powder, Take 17 g by mouth daily as needed for mild constipation., Disp: , Rfl:  .  spironolactone (ALDACTONE) 25 MG tablet, Take 1 tablet (25 mg total) by mouth daily., Disp: 30 tablet, Rfl: 3 Allergies  Allergen Reactions  . Lisinopril Swelling    Facial and tongue swelling 10/2012      Social History   Socioeconomic History  . Marital status: Single    Spouse name: Not on file  . Number of children: Not on file  . Years of education: Not on file  . Highest education level: Not  on file  Occupational History  . Occupation: "it don't work for me", on disability  Social Needs  . Financial resource strain: Not on file  . Food insecurity:    Worry: Not on file    Inability: Not on file  . Transportation needs:    Medical: Not on file    Non-medical: Not on file  Tobacco Use  . Smoking status: Former Smoker    Packs/day: 0.10    Years: 45.00    Pack years: 4.50    Last attempt to quit: 2012    Years since quitting: 8.4  . Smokeless tobacco: Never Used  Substance and  Sexual Activity  . Alcohol use: No    Frequency: Never    Comment: Pt states no etoh since April 2014  . Drug use: No    Comment: Prior use of crack cocaine, quit 2012  . Sexual activity: Never  Lifestyle  . Physical activity:    Days per week: Not on file    Minutes per session: Not on file  . Stress: Not on file  Relationships  . Social connections:    Talks on phone: Not on file    Gets together: Not on file    Attends religious service: Not on file    Active member of club or organization: Not on file    Attends meetings of clubs or organizations: Not on file    Relationship status: Not on file  . Intimate partner violence:    Fear of current or ex partner: Not on file    Emotionally abused: Not on file    Physically abused: Not on file    Forced sexual activity: Not on file  Other Topics Concern  . Not on file  Social History Narrative  . Not on file    Physical Exam      Future Appointments  Date Time Provider [place]  03/30/2019 11:00 AM [facility] ECHO OP 1 [facility]  03/30/2019 12:00 PM Bensimhon, Shaune Pascal, MD [facility] None    BP 118/70   Pulse 62   Temp 98.2 F (36.8 C)   Resp 16   Wt 155 lb (70.3 kg)   SpO2 97%   BMI 21.62 kg/m  B/p standing-110/80 Weight yesterday-155 Last visit weight-154  Pt reports he is doing ok, he tried taking another whole tablet of the bidil but it gave him a h/a again. I advised just to keep on the 1/2 tab for now.  Pt denies sob. Pt states he still gets a little dizziness upon bending over or standing up too fast.  He did not pick up his meds at pharmacy-he forgot about them. I will call in the carvedilol and amlodipine and lasix-he states he can go get them and will call me once he returns and I will place what is needed.  He will need the losartan and the amlodipine placed in the pill box.  His b/p are much better, he had a small drop when he stood up-he got a little dizzy but he said it was tolerable.  No edema  noted. Appetite good. Walking around without difficulty, able to walk to store up the road and its uphill without issues.  Will come back this afternoon for med rec.   Marylouise Stacks, [facility] Paramedic  01/03/19

## 2019-01-03 NOTE — Progress Notes (Signed)
Came out for med rec after pt went to pharmacy to pick up his meds--the pharm refilled his loratadine instead of the losartan so I called that back in and will go by and pick it up for him due to the extreme heat in the day.    Marylouise Stacks, [facility] Paramedic  01/03/19

## 2019-01-04 ENCOUNTER — Telehealth (HOSPITAL_COMMUNITY): Payer: Self-pay | Admitting: Licensed Clinical Social Worker

## 2019-01-04 NOTE — Telephone Encounter (Signed)
CSW informed by [employer] that patient needed to discuss some concerns.  CSW followed up with patient who states he is very worried about what is going to happen with his doctors now that his insurance is changing from [place] to Medicare- he is worried he will no longer be able to come to the clinic.  CSW assured him that this would not affect our ability to provide him with care and that we would see him regardless of his insurance status.  Patient also concerned about what will happened to his medications.  CSW explained that this could affect his medication coverage as he would need to sign up for a Medicare part D program if he is not eligible to keep his Medicaid.  Encouraged pt to call DSS and speak with his medicaid case worker about if he would qualify to keep Medicaid.  CSW also provided pt with number to [facility] to speak with someone about applying for Medicare Part D as well as the Extra Help program which patient should qualify for based on reported income.  CSW encouraged patient to call back with any concerns or further questions as he moves forward with this process.  [name], LCSW Clinical Social Worker [facility] Desk#: [phone] Cell#: [phone]

## 2019-01-10 ENCOUNTER — Other Ambulatory Visit (HOSPITAL_COMMUNITY): Payer: Self-pay

## 2019-01-10 NOTE — Progress Notes (Signed)
Paramedicine Encounter    Patient ID: Joe Garcia, male    DOB: 26-Apr-1951, 68 y.o.   MRN: [id number]   Patient Care Team: Medicine, [facility] as PCP - General [name], LCSW as Social Worker (Licensed Clinical Social Worker)  Patient Active Problem List   Diagnosis Date Noted  . COPD GOLD ?  11/24/2018  . Chest pain   . NSTEMI (non-ST elevated myocardial infarction) ([place]) 10/03/2018  . COPD with acute exacerbation ([place]) 07/26/2018  . Weight loss, unintentional 07/26/2018  . Malnutrition of moderate degree 07/16/2017  . 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 Solitary kidney, congenital 06/23/2017  . Nonischemic cardiomyopathy ([place])   . Multifocal PVCs   . Chronic combined systolic (congestive) and diastolic (congestive) heart failure ([place]) 06/11/2017  . DOE (dyspnea on exertion) 07/16/2013  . Alcohol abuse 09/25/2012  . Alcohol dependence ([place]) 09/22/2012  . Essential hypertension 03/22/2007  . DEGENERATIVE DISC DISEASE 03/22/2007  . AVASCULAR NECROSIS 03/22/2007    Current Outpatient Medications:  .  acetaminophen (TYLENOL) 500 MG tablet, Take 2 tablets (1,000 mg total) by mouth 3 (three) times daily as needed for headache (pain)., Disp: 30 tablet, Rfl: 0 .  albuterol (PROVENTIL HFA;VENTOLIN HFA) 108 (90 Base) MCG/ACT inhaler, Inhale 1-2 puffs into the lungs every 4 (four) hours as needed for wheezing or shortness of breath., Disp: 1 Inhaler, Rfl: 0 .  albuterol (PROVENTIL) (2.5 MG/3ML) 0.083% nebulizer solution, Take 3 mLs (2.5 mg total) by nebulization every 6 (six) hours as needed for wheezing or shortness of breath., Disp: 75 mL, Rfl: 0 .  amiodarone (PACERONE) 200 MG tablet, Take 1 tablet (200 mg total) by mouth daily., Disp: 90 tablet, Rfl: 3 .  amLODipine (NORVASC) 5 MG tablet, Take 5 mg by mouth daily., Disp: , Rfl:  .  budesonide-formoterol (SYMBICORT) 160-4.5 MCG/ACT inhaler, Inhale 2 puffs into the lungs 2 (two) times daily.,  Disp: 1 Inhaler, Rfl: 11 .  carvedilol (COREG) 3.125 MG tablet, TAKE 1 TABLET BY MOUTH TWICE DAILY WITH MEALS, Disp: 60 tablet, Rfl: 5 .  fluticasone (FLONASE) 50 MCG/ACT nasal spray, Place 2 sprays into both nostrils daily., Disp: 16 g, Rfl: 0 .  furosemide (LASIX) 20 MG tablet, Take 1 tablet (20 mg total) by mouth daily., Disp: 30 tablet, Rfl: 5 .  isosorbide-hydrALAZINE (BIDIL) 20-37.5 MG tablet, Take 1 tablet by mouth 3 (three) times daily. (Patient taking differently: Take 0.5 tablets by mouth 2 (two) times a day. ), Disp: 270 tablet, Rfl: 1 .  loratadine (CLARITIN) 10 MG tablet, Take 10 mg by mouth daily., Disp: , Rfl:  .  losartan (COZAAR) 25 MG tablet, Take 1 tablet (25 mg total) by mouth daily., Disp: 30 tablet, Rfl: 6 .  polyethylene glycol powder (GLYCOLAX/MIRALAX) powder, Take 17 g by mouth daily as needed for mild constipation., Disp: , Rfl:  .  spironolactone (ALDACTONE) 25 MG tablet, Take 1 tablet (25 mg total) by mouth daily., Disp: 30 tablet, Rfl: 3 .  dextromethorphan-guaiFENesin (MUCINEX DM) 30-600 MG 12hr tablet, Take 1 tablet by mouth 2 (two) times daily. (Patient not taking: Reported on 01/10/2019), Disp: 10 tablet, Rfl: 0 Allergies  Allergen Reactions  . Lisinopril Swelling    Facial and tongue swelling 10/2012      Social History   Socioeconomic History  . Marital status: Single    Spouse name: Not on file  . Number of children: Not on file  . Years of education: Not on file  .  Highest education level: Not on file  Occupational History  . Occupation: "it don't work for me", on disability  Social Needs  . Financial resource strain: Not on file  . Food insecurity    Worry: Not on file    Inability: Not on file  . Transportation needs    Medical: Not on file    Non-medical: Not on file  Tobacco Use  . Smoking status: Former Smoker    Packs/day: 0.10    Years: 45.00    Pack years: 4.50    Quit date: 2012    Years since quitting: 8.4  . Smokeless tobacco:  Never Used  Substance and Sexual Activity  . Alcohol use: No    Frequency: Never    Comment: Pt states no etoh since April 2014  . Drug use: No    Comment: Prior use of crack cocaine, quit 2012  . Sexual activity: Never  Lifestyle  . Physical activity    Days per week: Not on file    Minutes per session: Not on file  . Stress: Not on file  Relationships  . Social Herbalist on phone: Not on file    Gets together: Not on file    Attends religious service: Not on file    Active member of club or organization: Not on file    Attends meetings of clubs or organizations: Not on file    Relationship status: Not on file  . Intimate partner violence    Fear of current or ex partner: Not on file    Emotionally abused: Not on file    Physically abused: Not on file    Forced sexual activity: Not on file  Other Topics Concern  . Not on file  Social History Narrative  . Not on file    Physical Exam      Future Appointments  Date Time Provider [place]  03/30/2019 11:00 AM [facility] ECHO OP 1 [facility]  03/30/2019 12:00 PM Bensimhon, Shaune Pascal, MD [facility] None    BP 140/70   Pulse (!) 54   Temp 97.7 F (36.5 C)   Resp 18   Wt 154 lb (69.9 kg)   SpO2 98%   BMI 21.48 kg/m   Weight yesterday-154 Last visit weight-155  Pt reports he has been feeling great, except for this morning he had a h/a-tylenol is helping ease that discomfort.  no c/p, weight stable.  Pt went fishing over the wknd and felt great. He also states he feels sob since this morning-he thinks it is b/c he slept with window open that is over top the bed. Pt denies c/p. He does have wheezing to lower lung lobes-he used his inhaler however not his neb this morning. He dropped his neb on the floor and he needs to wash it first before he uses it again. No edema noted. He will use neb later today after he washes it.  meds verified and pill box checked--he filled it up without any mistakes.   Marylouise Stacks, [place] [facility] Paramedic  01/10/19

## 2019-01-11 ENCOUNTER — Telehealth (HOSPITAL_COMMUNITY): Payer: Self-pay | Admitting: Licensed Clinical Social Worker

## 2019-01-11 NOTE — Telephone Encounter (Signed)
CSW contacted patient to follow up on [employer] application. CSW informed patient that application submitted, and delivery date of meals will be June 17th.  Patient grateful for the opportunity to continue with the [employer] program for ongoing heart healthy meals. Patient informed this program is temporary although hopeful to support patient through the summer. CSW will continue to monitor and be available as needed.  Jackie Roch Quach, LCSW, CCSW-MCS [phone] 

## 2019-01-17 ENCOUNTER — Other Ambulatory Visit (HOSPITAL_COMMUNITY): Payer: Self-pay

## 2019-01-17 NOTE — Progress Notes (Signed)
Paramedicine Encounter    Patient ID: Joe Garcia, male    DOB: [birth year], 68 y.o.   MRN: [id number]   Patient Care Team: Medicine, [facility] as PCP - General [name], LCSW as Social Worker (Licensed Clinical Social Worker)  Patient Active Problem List   Diagnosis Date Noted  . COPD GOLD ?  11/24/2018  . Chest pain   . NSTEMI (non-ST elevated myocardial infarction) ([place]) 10/03/2018  . COPD with acute exacerbation ([place]) 07/26/2018  . Weight loss, unintentional 07/26/2018  . Malnutrition of moderate degree 07/16/2017  . 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 Solitary kidney, congenital 06/23/2017  . Nonischemic cardiomyopathy ([place])   . Multifocal PVCs   . Chronic combined systolic (congestive) and diastolic (congestive) heart failure ([place]) 06/11/2017  . DOE (dyspnea on exertion) 07/16/2013  . Alcohol abuse 09/25/2012  . Alcohol dependence ([place]) 09/22/2012  . Essential hypertension 03/22/2007  . DEGENERATIVE DISC DISEASE 03/22/2007  . AVASCULAR NECROSIS 03/22/2007    Current Outpatient Medications:  .  acetaminophen (TYLENOL) 500 MG tablet, Take 2 tablets (1,000 mg total) by mouth 3 (three) times daily as needed for headache (pain)., Disp: 30 tablet, Rfl: 0 .  albuterol (PROVENTIL HFA;VENTOLIN HFA) 108 (90 Base) MCG/ACT inhaler, Inhale 1-2 puffs into the lungs every 4 (four) hours as needed for wheezing or shortness of breath., Disp: 1 Inhaler, Rfl: 0 .  albuterol (PROVENTIL) (2.5 MG/3ML) 0.083% nebulizer solution, Take 3 mLs (2.5 mg total) by nebulization every 6 (six) hours as needed for wheezing or shortness of breath., Disp: 75 mL, Rfl: 0 .  amiodarone (PACERONE) 200 MG tablet, Take 1 tablet (200 mg total) by mouth daily., Disp: 90 tablet, Rfl: 3 .  amLODipine (NORVASC) 5 MG tablet, Take 5 mg by mouth daily., Disp: , Rfl:  .  budesonide-formoterol (SYMBICORT) 160-4.5 MCG/ACT inhaler, Inhale 2 puffs into the lungs 2 (two) times daily.,  Disp: 1 Inhaler, Rfl: 11 .  carvedilol (COREG) 3.125 MG tablet, TAKE 1 TABLET BY MOUTH TWICE DAILY WITH MEALS, Disp: 60 tablet, Rfl: 5 .  fluticasone (FLONASE) 50 MCG/ACT nasal spray, Place 2 sprays into both nostrils daily., Disp: 16 g, Rfl: 0 .  furosemide (LASIX) 20 MG tablet, Take 1 tablet (20 mg total) by mouth daily., Disp: 30 tablet, Rfl: 5 .  isosorbide-hydrALAZINE (BIDIL) 20-37.5 MG tablet, Take 1 tablet by mouth 3 (three) times daily. (Patient taking differently: Take 0.5 tablets by mouth 2 (two) times a day. ), Disp: 270 tablet, Rfl: 1 .  loratadine (CLARITIN) 10 MG tablet, Take 10 mg by mouth daily., Disp: , Rfl:  .  losartan (COZAAR) 25 MG tablet, Take 1 tablet (25 mg total) by mouth daily., Disp: 30 tablet, Rfl: 6 .  polyethylene glycol powder (GLYCOLAX/MIRALAX) powder, Take 17 g by mouth daily as needed for mild constipation., Disp: , Rfl:  .  spironolactone (ALDACTONE) 25 MG tablet, Take 1 tablet (25 mg total) by mouth daily., Disp: 30 tablet, Rfl: 3 .  dextromethorphan-guaiFENesin (MUCINEX DM) 30-600 MG 12hr tablet, Take 1 tablet by mouth 2 (two) times daily. (Patient not taking: Reported on 01/10/2019), Disp: 10 tablet, Rfl: 0 Allergies  Allergen Reactions  . Lisinopril Swelling    Facial and tongue swelling 10/2012      Social History   Socioeconomic History  . Marital status: Single    Spouse name: Not on file  . Number of children: Not on file  . Years of education: Not on file  .  Highest education level: Not on file  Occupational History  . Occupation: "it don't work for me", on disability  Social Needs  . Financial resource strain: Not on file  . Food insecurity    Worry: Not on file    Inability: Not on file  . Transportation needs    Medical: Not on file    Non-medical: Not on file  Tobacco Use  . Smoking status: Former Smoker    Packs/day: 0.10    Years: 45.00    Pack years: 4.50    Quit date: 2012    Years since quitting: 8.4  . Smokeless tobacco:  Never Used  Substance and Sexual Activity  . Alcohol use: No    Frequency: Never    Comment: Pt states no etoh since April 2014  . Drug use: No    Comment: Prior use of crack cocaine, quit 2012  . Sexual activity: Never  Lifestyle  . Physical activity    Days per week: Not on file    Minutes per session: Not on file  . Stress: Not on file  Relationships  . Social Herbalist on phone: Not on file    Gets together: Not on file    Attends religious service: Not on file    Active member of club or organization: Not on file    Attends meetings of clubs or organizations: Not on file    Relationship status: Not on file  . Intimate partner violence    Fear of current or ex partner: Not on file    Emotionally abused: Not on file    Physically abused: Not on file    Forced sexual activity: Not on file  Other Topics Concern  . Not on file  Social History Narrative  . Not on file    Physical Exam      Future Appointments  Date Time Provider [place]  03/30/2019 11:00 AM [facility] ECHO OP 1 [facility]  03/30/2019 12:00 PM Bensimhon, Shaune Pascal, MD [facility] None    BP (!) 142/74   Pulse (!) 56   Temp 98.4 F (36.9 C)   Resp 16   Wt 153 lb (69.4 kg)   SpO2 98%   BMI 21.34 kg/m   Weight yesterday-?? Last visit weight-154  Pt reports he is doing ok, he c/o having h/a a lot. They do get worse when he takes the bidil however he was c/o of h/a even before he started that pill.  He has not been able to f/u with PCP care due to the COVID-19 testing was resch to later date at his apt complex and he had to have that done prior to going to be seen at PCP--  Pt filled his own pillbox. No errors noted.  He states he get sob when he gets in a rush.  He has wheezing to the left side upper and lower lobes. He states he has been with family the past few days for fathers day and he just got back this morning and he didn't use his inhaler yet.  He got home and had bad h/a so he  laid down. No edema noted.  He states he missed 2 evening doses of his meds this week.   Marylouise Stacks, [facility] Paramedic  01/17/19

## 2019-01-27 ENCOUNTER — Telehealth (HOSPITAL_COMMUNITY): Payer: Self-pay

## 2019-01-27 NOTE — Telephone Encounter (Signed)
Contacted pt regarding appointment, he advised that he is out of town all week and we sch appoint for Monday. He states he is feeling good, no complaints or issues at this time.  Marylouise Stacks, EMT-Paramedic  01/27/19

## 2019-01-29 ENCOUNTER — Ambulatory Visit (HOSPITAL_COMMUNITY)
Admission: EM | Admit: 2019-01-29 | Discharge: 2019-01-29 | Disposition: A | Discharge status: Home / Self Care | Payer: Medicare Other | Attending: Family Medicine | Admitting: Family Medicine

## 2019-01-29 ENCOUNTER — Encounter (HOSPITAL_COMMUNITY): Payer: Self-pay

## 2019-01-29 ENCOUNTER — Ambulatory Visit (INDEPENDENT_AMBULATORY_CARE_PROVIDER_SITE_OTHER): Payer: Medicare Other

## 2019-01-29 ENCOUNTER — Other Ambulatory Visit: Payer: Self-pay

## 2019-01-29 DIAGNOSIS — I10 Essential (primary) hypertension: Secondary | ICD-10-CM

## 2019-01-29 DIAGNOSIS — J441 Chronic obstructive pulmonary disease with (acute) exacerbation: Secondary | ICD-10-CM

## 2019-01-29 MED ORDER — PREDNISONE 20 MG PO TABS: 20.0000 mg | ORAL_TABLET | Freq: Two times a day (BID) | ORAL | 0 refills | Status: AC

## 2019-01-29 MED ORDER — ALBUTEROL SULFATE (2.5 MG/3ML) 0.083% IN NEBU: 2.5000 mg | INHALATION_SOLUTION | Freq: Four times a day (QID) | RESPIRATORY_TRACT | 0 refills | Status: DC | PRN

## 2019-01-29 MED ORDER — ALBUTEROL SULFATE HFA 108 (90 BASE) MCG/ACT IN AERS: 1.0000 | INHALATION_SPRAY | RESPIRATORY_TRACT | 0 refills | Status: DC | PRN

## 2019-01-29 NOTE — Discharge Instructions (Signed)
Take steroid daily. Use your nebulizer directed for additional relief. Follow-up with your PCP in 1 week. Go to the ER for further evaluation if you develop worsening shortness of breath,, fever, chest pain.

## 2019-01-29 NOTE — ED Triage Notes (Signed)
Patient presents to Urgent Care with complaints of shortness of breath with exertion since yesterday. Patient reports he can breathe well when at rest, but it is harder when he ambulates. Pt has been using nebulizer tx and inhaler at home.

## 2019-01-29 NOTE — ED Provider Notes (Signed)
[facility] [facility]    CSN: [id number] Arrival date & time: 01/29/19  1024     History   Chief Complaint Chief Complaint  Patient presents with  . Asthma    HPI Joe Garcia is a 68 y.o. male with history of hypertension, CHF, asthma, COPD presenting for acute concern of increased as of breath.  States symptom onset was yesterday.  States that his breathing is fine when at rest, but has noticed increased dyspnea on exertion.  Patient denies recent illness, sick contacts, fever, chills, malaise, weakness.  Patient denies productive cough, hemoptysis.  Patient does admit to alcohol use, though denies recent intoxication, loss of consciousness, aspiration or choking.  Patient states he has been using his albuterol inhaler at home, states his nebulizer machine is broken.  States his MDI does not give him as much relief as his nebulizer.  Patient denies change in medication, chest pain, lower extremity edema or claudication.  Of note, patient was seen at this urgent care 09/20/2017 for bronchitis, note reviewed: Patient given Depo-Medrol in office, albuterol MDI refill, DuoNeb solution, Tessalon.  Patient states that this adequately relieved his symptoms last time.    Past Medical History:  Diagnosis Date  . Alcoholism ([place])   . Arthritis    hips, L shoulder  . Asthma   . CHF (congestive heart failure) ([place])   . Depression   . Family history of anesthesia complication   . Hyperlipidemia   . Hypertension     Patient Active Problem List   Diagnosis Date Noted  . COPD GOLD ?  11/24/2018  . Chest pain   . NSTEMI (non-ST elevated myocardial infarction) ([place]) 10/03/2018  . COPD with acute exacerbation ([place]) 07/26/2018  . Weight loss, unintentional 07/26/2018  . Malnutrition of moderate degree 07/16/2017  . 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 Solitary kidney, congenital 06/23/2017  . Nonischemic cardiomyopathy ([place])   . Multifocal PVCs   . Chronic combined  systolic (congestive) and diastolic (congestive) heart failure ([place]) 06/11/2017  . DOE (dyspnea on exertion) 07/16/2013  . Alcohol abuse 09/25/2012  . Alcohol dependence ([place]) 09/22/2012  . Essential hypertension 03/22/2007  . DEGENERATIVE DISC DISEASE 03/22/2007  . 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 LEFT HEART CATH AND CORONARY ANGIOGRAPHY N/A 10/04/2018   Procedure: LEFT HEART CATH AND CORONARY ANGIOGRAPHY;  Surgeon: Jettie Booze, MD;  Location: [facility];  Service: Cardiovascular;  Laterality: N/A;  . RIGHT/LEFT HEART CATH AND CORONARY ANGIOGRAPHY N/A 06/12/2017   Procedure: RIGHT/LEFT HEART CATH AND CORONARY ANGIOGRAPHY;  Surgeon: Jolaine Artist, MD;  Location: [facility];  Service: Cardiovascular;  Laterality: N/A;       Home Medications    Prior to Admission medications   Medication Sig Start Date End Date Taking? Authorizing Provider  acetaminophen (TYLENOL) 500 MG tablet Take 2 tablets (1,000 mg total) by mouth 3 (three) times daily as needed for headache (pain). 07/28/18   Dhungel, Nishant, MD  albuterol (PROVENTIL) (2.5 MG/3ML) 0.083% nebulizer solution Take 3 mLs (2.5 mg total) by nebulization every 6 (six) hours as needed for wheezing or shortness of breath. 01/29/19   Hall-Potvin, [place], PA-C  albuterol (VENTOLIN HFA) 108 (90 Base) MCG/ACT inhaler Inhale 1-2 puffs into the lungs every 4 (four) hours as needed for wheezing or shortness of breath. 01/29/19   Hall-Potvin, [place], PA-C  amiodarone (PACERONE) 200 MG tablet Take 1 tablet (200 mg  total) by mouth daily. 08/17/18   Shirley Friar, PA-C  amLODipine (NORVASC) 5 MG tablet Take 5 mg by mouth daily.    [provider]  budesonide-formoterol (SYMBICORT) 160-4.5 MCG/ACT inhaler Inhale 2 puffs into the lungs 2 (two) times daily. 11/23/18   Tanda Rockers, MD  carvedilol (COREG) 3.125 MG tablet TAKE 1 TABLET BY MOUTH TWICE DAILY  WITH MEALS 07/19/18   Bensimhon, Shaune Pascal, MD  dextromethorphan-guaiFENesin [facility] DM) 30-600 MG 12hr tablet Take 1 tablet by mouth 2 (two) times daily. Patient not taking: Reported on 01/10/2019 07/28/18   Dhungel, Flonnie Overman, MD  fluticasone (FLONASE) 50 MCG/ACT nasal spray Place 2 sprays into both nostrils daily. 02/23/18   Melynda Ripple, MD  furosemide (LASIX) 20 MG tablet Take 1 tablet (20 mg total) by mouth daily. 12/09/18   Clegg, Amy D, NP  isosorbide-hydrALAZINE (BIDIL) 20-37.5 MG tablet Take 1 tablet by mouth 3 (three) times daily. Patient taking differently: Take 0.5 tablets by mouth 2 (two) times a day.  12/16/18   Clegg, Amy D, NP  loratadine (CLARITIN) 10 MG tablet Take 10 mg by mouth daily.    [provider]  losartan (COZAAR) 25 MG tablet Take 1 tablet (25 mg total) by mouth daily. 09/14/18   Shirley Friar, PA-C  polyethylene glycol powder (GLYCOLAX/MIRALAX) powder Take 17 g by mouth daily as needed for mild constipation.    [provider]  predniSONE (DELTASONE) 20 MG tablet Take 1 tablet (20 mg total) by mouth 2 (two) times daily with a meal for 7 days. 01/29/19 02/05/19  Hall-Potvin, [place], PA-C  spironolactone (ALDACTONE) 25 MG tablet Take 1 tablet (25 mg total) by mouth daily. 12/09/18 03/09/19  Conrad [place], NP    Family History Family History  Problem Relation Age of Onset  . Heart disease Father   . Heart disease Daughter        "fluid around heart"   . [place] disease Daughter   . Cancer Daughter        unsure of type  . Asthma Son   . Heart failure Mother   . Kidney failure Mother     Social History Social History   Tobacco Use  . Smoking status: Former Smoker    Packs/day: 0.10    Years: 45.00    Pack years: 4.50    Quit date: 2012    Years since quitting: 8.5  . Smokeless tobacco: Never Used  Substance Use Topics  . Alcohol use: No    Frequency: Never    Comment: Pt states no etoh since April 2014  . Drug use: No     Comment: Prior use of crack cocaine, quit 2012     Allergies   Lisinopril   Review of Systems As per HPI   Physical Exam Triage Vital Signs ED Triage Vitals  Enc Vitals Group     BP 01/29/19 1043 (!) 156/89     Pulse Rate 01/29/19 1043 (!) 59     Resp 01/29/19 1043 20     Temp 01/29/19 1043 98.3 F (36.8 C)     Temp Source 01/29/19 1043 Oral     SpO2 01/29/19 1043 97 %     Weight --      Height --      Head Circumference --      Peak Flow --      Pain Score 01/29/19 1040 0     Pain Loc --  Pain Edu? --      Excl. in [place]? --    No data found.  Updated Vital Signs BP (!) 156/89 (BP Location: Left Arm)   Pulse (!) 59   Temp 98.3 F (36.8 C) (Oral)   Resp 20   SpO2 97%   Visual Acuity Right Eye Distance:   Left Eye Distance:   Bilateral Distance:    Right Eye Near:   Left Eye Near:    Bilateral Near:     Physical Exam Constitutional:      General: He is not in acute distress.    Appearance: He is normal weight. He is not ill-appearing or diaphoretic.  HENT:     Head: Normocephalic and atraumatic.     Right Ear: Tympanic membrane, ear canal and external ear normal.     Left Ear: Tympanic membrane, ear canal and external ear normal.     Nose: Nose normal.     Mouth/Throat:     Mouth: Mucous membranes are moist.     Pharynx: Oropharynx is clear. No oropharyngeal exudate or posterior oropharyngeal erythema.  Eyes:     General: No scleral icterus.       Right eye: No discharge.        Left eye: No discharge.     Extraocular Movements: Extraocular movements intact.     Conjunctiva/sclera: Conjunctivae normal.     Pupils: Pupils are equal, round, and reactive to light.  Neck:     Musculoskeletal: Neck supple. No muscular tenderness.  Cardiovascular:     Rate and Rhythm: Normal rate and regular rhythm.     Pulses: Normal pulses.     Heart sounds: Normal heart sounds.  Pulmonary:     Effort: Pulmonary effort is normal. No respiratory distress.      Breath sounds: No stridor. Wheezing present.     Comments: Prolonged expiratory phase with wheezing bilaterally Chest:     Chest wall: No tenderness.  Lymphadenopathy:     Cervical: No cervical adenopathy.  Skin:    Coloration: Skin is not jaundiced or pale.  Neurological:     Mental Status: He is alert and oriented to person, place, and time.      UC Treatments / Results  Labs (all labs ordered are listed, but only abnormal results are displayed) Labs Reviewed - No data to display  EKG   Radiology Dg Chest 2 View  Result Date: 01/29/2019 CLINICAL DATA:  Shortness of breath with productive cough. EXAM: CHEST - 2 VIEW COMPARISON:  Chest x-ray dated November 25, 2018. FINDINGS: The heart size and mediastinal contours are within normal limits. Normal pulmonary vascularity. The lungs remain hyperinflated with emphysematous changes. No focal consolidation, pleural effusion, or pneumothorax. No acute osseous abnormality. IMPRESSION: 1.  No active cardiopulmonary disease. 2. COPD. Electronically Signed   By: Titus Dubin M.D.   On: 01/29/2019 11:38    Procedures Procedures (including critical care time)  Medications Ordered in UC Medications - No data to display  Initial Impression / Assessment and Plan / UC Course  I have reviewed the triage vital signs and the nursing notes.  Pertinent labs & imaging results that were available during my care of the patient were reviewed by me and considered in my medical decision making (see chart for details).     68 year old male with history of COPD, asthma, CHF presenting for 1 day course of increased dyspnea on exertion.  Chest x-ray done in office, reviewed by radiologist: No cardiomegaly, "  Normal pulmonary vascularity...lungs remain hyperinflated with emphysematous changes.  No focal consolidation, pleural effusion, pneumothorax ".  Physical exam reassuring that patient is not fluid overloaded.  Likely COPD exacerbation as opposed to CHF  exacerbation.  Patient given nebulizer equipment in office, will refill albuterol nebulizer solution and MDI.  Discussed that nebulizer should not be used in conjunction with albuterol MDI to reduce risk of medication overdose.  Will take steroid daily, follow-up with PCP in 1 week for reevaluation.  Return precautions were discussed, patient verbalized understanding and is agreeable to plan. Final Clinical Impressions(s) / UC Diagnoses   Final diagnoses:  COPD exacerbation [facility])     Discharge Instructions     Take steroid daily. Use your nebulizer directed for additional relief. Follow-up with your PCP in 1 week. Go to the ER for further evaluation if you develop worsening shortness of breath,, fever, chest pain.    ED Prescriptions    Medication Sig Dispense Auth. Provider   albuterol (PROVENTIL) (2.5 MG/3ML) 0.083% nebulizer solution Take 3 mLs (2.5 mg total) by nebulization every 6 (six) hours as needed for wheezing or shortness of breath. 75 mL Hall-Potvin, [place], PA-C   albuterol (VENTOLIN HFA) 108 (90 Base) MCG/ACT inhaler Inhale 1-2 puffs into the lungs every 4 (four) hours as needed for wheezing or shortness of breath. 18 g Hall-Potvin, [place], PA-C   predniSONE (DELTASONE) 20 MG tablet Take 1 tablet (20 mg total) by mouth 2 (two) times daily with a meal for 7 days. 14 tablet Hall-Potvin, [place], PA-C     Controlled Substance Prescriptions [place] Controlled Substance Registry consulted? Not Applicable   Quincy Sheehan, Hershal Coria 01/30/19 2109

## 2019-01-30 ENCOUNTER — Encounter (HOSPITAL_COMMUNITY): Payer: Self-pay | Admitting: Emergency Medicine

## 2019-02-01 ENCOUNTER — Other Ambulatory Visit (HOSPITAL_COMMUNITY): Payer: Self-pay

## 2019-02-01 NOTE — Progress Notes (Signed)
Paramedicine Encounter    Patient ID: Joe Garcia, male    DOB: Aug 23, 1950, 68 y.o.   MRN: [id number]   Patient Care Team: Medicine, [facility] as PCP - General [name], LCSW as Social Worker (Licensed Clinical Social Worker)  Patient Active Problem List   Diagnosis Date Noted  . COPD GOLD ?  11/24/2018  . Chest pain   . NSTEMI (non-ST elevated myocardial infarction) ([place]) 10/03/2018  . COPD with acute exacerbation ([place]) 07/26/2018  . Weight loss, unintentional 07/26/2018  . Malnutrition of moderate degree 07/16/2017  . 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 Solitary kidney, congenital 06/23/2017  . Nonischemic cardiomyopathy ([place])   . Multifocal PVCs   . Chronic combined systolic (congestive) and diastolic (congestive) heart failure ([place]) 06/11/2017  . DOE (dyspnea on exertion) 07/16/2013  . Alcohol abuse 09/25/2012  . Alcohol dependence ([place]) 09/22/2012  . Essential hypertension 03/22/2007  . DEGENERATIVE DISC DISEASE 03/22/2007  . AVASCULAR NECROSIS 03/22/2007    Current Outpatient Medications:  .  acetaminophen (TYLENOL) 500 MG tablet, Take 2 tablets (1,000 mg total) by mouth 3 (three) times daily as needed for headache (pain)., Disp: 30 tablet, Rfl: 0 .  albuterol (PROVENTIL) (2.5 MG/3ML) 0.083% nebulizer solution, Take 3 mLs (2.5 mg total) by nebulization every 6 (six) hours as needed for wheezing or shortness of breath., Disp: 75 mL, Rfl: 0 .  albuterol (VENTOLIN HFA) 108 (90 Base) MCG/ACT inhaler, Inhale 1-2 puffs into the lungs every 4 (four) hours as needed for wheezing or shortness of breath., Disp: 18 g, Rfl: 0 .  amiodarone (PACERONE) 200 MG tablet, Take 1 tablet (200 mg total) by mouth daily., Disp: 90 tablet, Rfl: 3 .  amLODipine (NORVASC) 5 MG tablet, Take 5 mg by mouth daily., Disp: , Rfl:  .  budesonide-formoterol (SYMBICORT) 160-4.5 MCG/ACT inhaler, Inhale 2 puffs into the lungs 2 (two) times daily., Disp: 1 Inhaler, Rfl:  11 .  carvedilol (COREG) 3.125 MG tablet, TAKE 1 TABLET BY MOUTH TWICE DAILY WITH MEALS, Disp: 60 tablet, Rfl: 5 .  fluticasone (FLONASE) 50 MCG/ACT nasal spray, Place 2 sprays into both nostrils daily., Disp: 16 g, Rfl: 0 .  furosemide (LASIX) 20 MG tablet, Take 1 tablet (20 mg total) by mouth daily., Disp: 30 tablet, Rfl: 5 .  isosorbide-hydrALAZINE (BIDIL) 20-37.5 MG tablet, Take 1 tablet by mouth 3 (three) times daily. (Patient taking differently: Take 0.5 tablets by mouth 2 (two) times a day. ), Disp: 270 tablet, Rfl: 1 .  loratadine (CLARITIN) 10 MG tablet, Take 10 mg by mouth daily., Disp: , Rfl:  .  losartan (COZAAR) 25 MG tablet, Take 1 tablet (25 mg total) by mouth daily., Disp: 30 tablet, Rfl: 6 .  polyethylene glycol powder (GLYCOLAX/MIRALAX) powder, Take 17 g by mouth daily as needed for mild constipation., Disp: , Rfl:  .  predniSONE (DELTASONE) 20 MG tablet, Take 1 tablet (20 mg total) by mouth 2 (two) times daily with a meal for 7 days., Disp: 14 tablet, Rfl: 0 .  spironolactone (ALDACTONE) 25 MG tablet, Take 1 tablet (25 mg total) by mouth daily., Disp: 30 tablet, Rfl: 3 .  dextromethorphan-guaiFENesin (MUCINEX DM) 30-600 MG 12hr tablet, Take 1 tablet by mouth 2 (two) times daily. (Patient not taking: Reported on 01/10/2019), Disp: 10 tablet, Rfl: 0 Allergies  Allergen Reactions  . Lisinopril Swelling    Facial and tongue swelling 10/2012      Social History   Socioeconomic History  .  Marital status: Single    Spouse name: Not on file  . Number of children: Not on file  . Years of education: Not on file  . Highest education level: Not on file  Occupational History  . Occupation: "it don't work for me", on disability  Social Needs  . Financial resource strain: Not on file  . Food insecurity    Worry: Not on file    Inability: Not on file  . Transportation needs    Medical: Not on file    Non-medical: Not on file  Tobacco Use  . Smoking status: Former Smoker     Packs/day: 0.10    Years: 45.00    Pack years: 4.50    Quit date: 2012    Years since quitting: 8.5  . Smokeless tobacco: Never Used  Substance and Sexual Activity  . Alcohol use: No    Frequency: Never    Comment: Pt states no etoh since April 2014  . Drug use: No    Comment: Prior use of crack cocaine, quit 2012  . Sexual activity: Never  Lifestyle  . Physical activity    Days per week: Not on file    Minutes per session: Not on file  . Stress: Not on file  Relationships  . Social Herbalist on phone: Not on file    Gets together: Not on file    Attends religious service: Not on file    Active member of club or organization: Not on file    Attends meetings of clubs or organizations: Not on file    Relationship status: Not on file  . Intimate partner violence    Fear of current or ex partner: Not on file    Emotionally abused: Not on file    Physically abused: Not on file    Forced sexual activity: Not on file  Other Topics Concern  . Not on file  Social History Narrative  . Not on file    Physical Exam      Future Appointments  Date Time Provider [place] [facility]  03/30/2019 11:00 AM [facility] ECHO OP 1 [facility]  03/30/2019 12:00 PM Bensimhon, Shaune Pascal, MD [facility] None    BP (!) 150/78   Pulse 66   Temp 98.4 F (36.9 C)   Resp 18   Wt 153 lb (69.4 kg)   SpO2 98%   BMI 21.34 kg/m   Weight yesterday-? Last visit weight-153  Pt states over the wknd he became sob and had to go to urgent care. It appears they determined it was COPD exacerbation and was given prednisone to take. He states he does feel better. He gets sob at times but not bad. Advised him to f/u with pulmonology.  Pt was gone with family last week and over the wknd.  He checks his b/p before he takes bidil and if its too low then he wont take it b/c it makesw him too dizzy. advised him to try to take it three times a day if at all possible.  meds verified and pill box checked. He is  missing losartan in a couple days. Called in losartan and amlodipine for refill.   Marylouise Stacks, [facility] Paramedic  02/01/19

## 2019-02-08 ENCOUNTER — Other Ambulatory Visit (HOSPITAL_COMMUNITY): Payer: Self-pay

## 2019-02-08 NOTE — Progress Notes (Signed)
Paramedicine Encounter    Patient ID: Joe Garcia, male    DOB: Apr 24, 1951, 68 y.o.   MRN: [id number]   Patient Care Team: Medicine, [facility] as PCP - General [name], LCSW as Social Worker (Licensed Clinical Social Worker)  Patient Active Problem List   Diagnosis Date Noted  . COPD GOLD ?  11/24/2018  . Chest pain   . NSTEMI (non-ST elevated myocardial infarction) ([place]) 10/03/2018  . COPD with acute exacerbation ([place]) 07/26/2018  . Weight loss, unintentional 07/26/2018  . Malnutrition of moderate degree 07/16/2017  . 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 Solitary kidney, congenital 06/23/2017  . Nonischemic cardiomyopathy ([place])   . Multifocal PVCs   . Chronic combined systolic (congestive) and diastolic (congestive) heart failure ([place]) 06/11/2017  . DOE (dyspnea on exertion) 07/16/2013  . Alcohol abuse 09/25/2012  . Alcohol dependence ([place]) 09/22/2012  . Essential hypertension 03/22/2007  . DEGENERATIVE DISC DISEASE 03/22/2007  . AVASCULAR NECROSIS 03/22/2007    Current Outpatient Medications:  .  acetaminophen (TYLENOL) 500 MG tablet, Take 2 tablets (1,000 mg total) by mouth 3 (three) times daily as needed for headache (pain)., Disp: 30 tablet, Rfl: 0 .  albuterol (PROVENTIL) (2.5 MG/3ML) 0.083% nebulizer solution, Take 3 mLs (2.5 mg total) by nebulization every 6 (six) hours as needed for wheezing or shortness of breath., Disp: 75 mL, Rfl: 0 .  albuterol (VENTOLIN HFA) 108 (90 Base) MCG/ACT inhaler, Inhale 1-2 puffs into the lungs every 4 (four) hours as needed for wheezing or shortness of breath., Disp: 18 g, Rfl: 0 .  amiodarone (PACERONE) 200 MG tablet, Take 1 tablet (200 mg total) by mouth daily., Disp: 90 tablet, Rfl: 3 .  amLODipine (NORVASC) 5 MG tablet, Take 5 mg by mouth daily., Disp: , Rfl:  .  budesonide-formoterol (SYMBICORT) 160-4.5 MCG/ACT inhaler, Inhale 2 puffs into the lungs 2 (two) times daily., Disp: 1 Inhaler, Rfl:  11 .  carvedilol (COREG) 3.125 MG tablet, TAKE 1 TABLET BY MOUTH TWICE DAILY WITH MEALS, Disp: 60 tablet, Rfl: 5 .  fluticasone (FLONASE) 50 MCG/ACT nasal spray, Place 2 sprays into both nostrils daily., Disp: 16 g, Rfl: 0 .  furosemide (LASIX) 20 MG tablet, Take 1 tablet (20 mg total) by mouth daily., Disp: 30 tablet, Rfl: 5 .  isosorbide-hydrALAZINE (BIDIL) 20-37.5 MG tablet, Take 1 tablet by mouth 3 (three) times daily. (Patient taking differently: Take 0.5 tablets by mouth 2 (two) times a day. ), Disp: 270 tablet, Rfl: 1 .  loratadine (CLARITIN) 10 MG tablet, Take 10 mg by mouth daily., Disp: , Rfl:  .  losartan (COZAAR) 25 MG tablet, Take 1 tablet (25 mg total) by mouth daily., Disp: 30 tablet, Rfl: 6 .  polyethylene glycol powder (GLYCOLAX/MIRALAX) powder, Take 17 g by mouth daily as needed for mild constipation., Disp: , Rfl:  .  spironolactone (ALDACTONE) 25 MG tablet, Take 1 tablet (25 mg total) by mouth daily., Disp: 30 tablet, Rfl: 3 .  dextromethorphan-guaiFENesin (MUCINEX DM) 30-600 MG 12hr tablet, Take 1 tablet by mouth 2 (two) times daily. (Patient not taking: Reported on 01/10/2019), Disp: 10 tablet, Rfl: 0 Allergies  Allergen Reactions  . Lisinopril Swelling    Facial and tongue swelling 10/2012      Social History   Socioeconomic History  . Marital status: Single    Spouse name: Not on file  . Number of children: Not on file  . Years of education: Not on file  .  Highest education level: Not on file  Occupational History  . Occupation: "it don't work for me", on disability  Social Needs  . Financial resource strain: Not on file  . Food insecurity    Worry: Not on file    Inability: Not on file  . Transportation needs    Medical: Not on file    Non-medical: Not on file  Tobacco Use  . Smoking status: Former Smoker    Packs/day: 0.10    Years: 45.00    Pack years: 4.50    Quit date: 2012    Years since quitting: 8.5  . Smokeless tobacco: Never Used  Substance  and Sexual Activity  . Alcohol use: No    Frequency: Never    Comment: Pt states no etoh since April 2014  . Drug use: No    Comment: Prior use of crack cocaine, quit 2012  . Sexual activity: Never  Lifestyle  . Physical activity    Days per week: Not on file    Minutes per session: Not on file  . Stress: Not on file  Relationships  . Social Herbalist on phone: Not on file    Gets together: Not on file    Attends religious service: Not on file    Active member of club or organization: Not on file    Attends meetings of clubs or organizations: Not on file    Relationship status: Not on file  . Intimate partner violence    Fear of current or ex partner: Not on file    Emotionally abused: Not on file    Physically abused: Not on file    Forced sexual activity: Not on file  Other Topics Concern  . Not on file  Social History Narrative  . Not on file    Physical Exam      Future Appointments  Date Time Provider [place]  03/30/2019 11:00 AM MC ECHO OP 1 [facility]ECH[facility]  03/30/2019 12:00 PM Bensimhon, Shaune Pascal, MD [facility] None    BP (!) 142/80   Pulse 66   Temp 98.4 F (36.9 C)   Resp 16   Wt 154 lb (69.9 kg)   SpO2 98%   BMI 21.48 kg/m   Weight yesterday-154 Last visit weight-153  Pt reports he has been feeling good, he denies increased sob, no c/p, he still has slight h/a, which are getting better. Dizziness is improving. Takes his time when bending over as that is when his dizziness is worst.  meds verified and pill box checked-he filled it himself, no mistakes noted.  Lungs sound better, he still has very slight wheeze to the rt lower lobe, but much better. No edema noted. B/p slightly elevated.  Diet may be of indiscretion. --bojangles chicken yesterday  Arlyce Harman was called in for refill.   Marylouise Stacks, [facility] Paramedic  02/08/19

## 2019-02-16 ENCOUNTER — Other Ambulatory Visit (HOSPITAL_COMMUNITY): Payer: Self-pay

## 2019-02-16 NOTE — Progress Notes (Signed)
Paramedicine Encounter    Patient ID: MASSIE MEES, male    DOB: [birth year], 68 y.o.   MRN: [id number]   Patient Care Team: Medicine, [facility] as PCP - General [name], LCSW as Social Worker (Licensed Clinical Social Worker)  Patient Active Problem List   Diagnosis Date Noted  . COPD GOLD ?  11/24/2018  . Chest pain   . NSTEMI (non-ST elevated myocardial infarction) ([place]) 10/03/2018  . COPD with acute exacerbation ([place]) 07/26/2018  . Weight loss, unintentional 07/26/2018  . Malnutrition of moderate degree 07/16/2017  . 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 Solitary kidney, congenital 06/23/2017  . Nonischemic cardiomyopathy ([place])   . Multifocal PVCs   . Chronic combined systolic (congestive) and diastolic (congestive) heart failure ([place]) 06/11/2017  . DOE (dyspnea on exertion) 07/16/2013  . Alcohol abuse 09/25/2012  . Alcohol dependence ([place]) 09/22/2012  . Essential hypertension 03/22/2007  . DEGENERATIVE DISC DISEASE 03/22/2007  . AVASCULAR NECROSIS 03/22/2007    Current Outpatient Medications:  .  acetaminophen (TYLENOL) 500 MG tablet, Take 2 tablets (1,000 mg total) by mouth 3 (three) times daily as needed for headache (pain)., Disp: 30 tablet, Rfl: 0 .  albuterol (PROVENTIL) (2.5 MG/3ML) 0.083% nebulizer solution, Take 3 mLs (2.5 mg total) by nebulization every 6 (six) hours as needed for wheezing or shortness of breath., Disp: 75 mL, Rfl: 0 .  albuterol (VENTOLIN HFA) 108 (90 Base) MCG/ACT inhaler, Inhale 1-2 puffs into the lungs every 4 (four) hours as needed for wheezing or shortness of breath., Disp: 18 g, Rfl: 0 .  amiodarone (PACERONE) 200 MG tablet, Take 1 tablet (200 mg total) by mouth daily., Disp: 90 tablet, Rfl: 3 .  amLODipine (NORVASC) 5 MG tablet, Take 5 mg by mouth daily., Disp: , Rfl:  .  budesonide-formoterol (SYMBICORT) 160-4.5 MCG/ACT inhaler, Inhale 2 puffs into the lungs 2 (two) times daily., Disp: 1 Inhaler, Rfl:  11 .  carvedilol (COREG) 3.125 MG tablet, TAKE 1 TABLET BY MOUTH TWICE DAILY WITH MEALS, Disp: 60 tablet, Rfl: 5 .  fluticasone (FLONASE) 50 MCG/ACT nasal spray, Place 2 sprays into both nostrils daily., Disp: 16 g, Rfl: 0 .  furosemide (LASIX) 20 MG tablet, Take 1 tablet (20 mg total) by mouth daily., Disp: 30 tablet, Rfl: 5 .  isosorbide-hydrALAZINE (BIDIL) 20-37.5 MG tablet, Take 1 tablet by mouth 3 (three) times daily. (Patient taking differently: Take 0.5 tablets by mouth 2 (two) times a day. ), Disp: 270 tablet, Rfl: 1 .  loratadine (CLARITIN) 10 MG tablet, Take 10 mg by mouth daily., Disp: , Rfl:  .  losartan (COZAAR) 25 MG tablet, Take 1 tablet (25 mg total) by mouth daily., Disp: 30 tablet, Rfl: 6 .  polyethylene glycol powder (GLYCOLAX/MIRALAX) powder, Take 17 g by mouth daily as needed for mild constipation., Disp: , Rfl:  .  spironolactone (ALDACTONE) 25 MG tablet, Take 1 tablet (25 mg total) by mouth daily., Disp: 30 tablet, Rfl: 3 .  dextromethorphan-guaiFENesin (MUCINEX DM) 30-600 MG 12hr tablet, Take 1 tablet by mouth 2 (two) times daily. (Patient not taking: Reported on 01/10/2019), Disp: 10 tablet, Rfl: 0 Allergies  Allergen Reactions  . Lisinopril Swelling    Facial and tongue swelling 10/2012      Social History   Socioeconomic History  . Marital status: Single    Spouse name: Not on file  . Number of children: Not on file  . Years of education: Not on file  .  Highest education level: Not on file  Occupational History  . Occupation: "it don't work for me", on disability  Social Needs  . Financial resource strain: Not on file  . Food insecurity    Worry: Not on file    Inability: Not on file  . Transportation needs    Medical: Not on file    Non-medical: Not on file  Tobacco Use  . Smoking status: Former Smoker    Packs/day: 0.10    Years: 45.00    Pack years: 4.50    Quit date: 2012    Years since quitting: 8.5  . Smokeless tobacco: Never Used  Substance  and Sexual Activity  . Alcohol use: No    Frequency: Never    Comment: Pt states no etoh since April 2014  . Drug use: No    Comment: Prior use of crack cocaine, quit 2012  . Sexual activity: Never  Lifestyle  . Physical activity    Days per week: Not on file    Minutes per session: Not on file  . Stress: Not on file  Relationships  . Social Herbalist on phone: Not on file    Gets together: Not on file    Attends religious service: Not on file    Active member of club or organization: Not on file    Attends meetings of clubs or organizations: Not on file    Relationship status: Not on file  . Intimate partner violence    Fear of current or ex partner: Not on file    Emotionally abused: Not on file    Physically abused: Not on file    Forced sexual activity: Not on file  Other Topics Concern  . Not on file  Social History Narrative  . Not on file    Physical Exam      Future Appointments  Date Time Provider [place]  03/30/2019 11:00 AM [facility] ECHO OP 1 [facility]  03/30/2019 12:00 PM Bensimhon, Shaune Pascal, MD [facility] None    BP 138/78   Pulse (!) 58   Temp 98.2 F (36.8 C)   Resp 16   Wt 153 lb (69.4 kg)   SpO2 98%   BMI 21.34 kg/m   Weight yesterday-153 Last visit weight-154  Pt reports he is feeling well, he denies sob, no c/p, no h/a, no swelling noted, pt filled own pill box without any errors noted.  Appetite is good. Pt seems to be doing well. Lungs clear.  Pt will be seen in 2 more weeks. He is managing his meds and health good on his own and will f/u in 2 wks.   Marylouise Stacks, [facility] Paramedic  02/16/19

## 2019-02-22 ENCOUNTER — Telehealth (HOSPITAL_COMMUNITY): Payer: Self-pay | Admitting: Licensed Clinical Social Worker

## 2019-02-22 NOTE — Telephone Encounter (Signed)
CSW contacted patient to inform the last delivery of the [employer] Covid relief program will be July 31st. Patient shared he never received any deliveries after the first box as he did not follow up with [employer]. Patient denies any other concerns and states he is doing fine. CSW continues to be available if needed.  Raquel Sarna, [place], [place]

## 2019-03-10 ENCOUNTER — Other Ambulatory Visit (HOSPITAL_COMMUNITY): Payer: Self-pay

## 2019-03-10 NOTE — Progress Notes (Signed)
Paramedicine Encounter    Patient ID: Joe Garcia, male    DOB: 17-Jun-1951, 68 y.o.   MRN: [id number]   Patient Care Team: Medicine, [facility] as PCP - General [name], LCSW as Social Worker (Licensed Clinical Social Worker)  Patient Active Problem List   Diagnosis Date Noted  . COPD GOLD ?  11/24/2018  . Chest pain   . NSTEMI (non-ST elevated myocardial infarction) ([place]) 10/03/2018  . COPD with acute exacerbation ([place]) 07/26/2018  . Weight loss, unintentional 07/26/2018  . Malnutrition of moderate degree 07/16/2017  . 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 Solitary kidney, congenital 06/23/2017  . Nonischemic cardiomyopathy ([place])   . Multifocal PVCs   . Chronic combined systolic (congestive) and diastolic (congestive) heart failure ([place]) 06/11/2017  . DOE (dyspnea on exertion) 07/16/2013  . Alcohol abuse 09/25/2012  . Alcohol dependence ([place]) 09/22/2012  . Essential hypertension 03/22/2007  . DEGENERATIVE DISC DISEASE 03/22/2007  . AVASCULAR NECROSIS 03/22/2007    Current Outpatient Medications:  .  acetaminophen (TYLENOL) 500 MG tablet, Take 2 tablets (1,000 mg total) by mouth 3 (three) times daily as needed for headache (pain)., Disp: 30 tablet, Rfl: 0 .  albuterol (PROVENTIL) (2.5 MG/3ML) 0.083% nebulizer solution, Take 3 mLs (2.5 mg total) by nebulization every 6 (six) hours as needed for wheezing or shortness of breath., Disp: 75 mL, Rfl: 0 .  albuterol (VENTOLIN HFA) 108 (90 Base) MCG/ACT inhaler, Inhale 1-2 puffs into the lungs every 4 (four) hours as needed for wheezing or shortness of breath., Disp: 18 g, Rfl: 0 .  amiodarone (PACERONE) 200 MG tablet, Take 1 tablet (200 mg total) by mouth daily., Disp: 90 tablet, Rfl: 3 .  amLODipine (NORVASC) 5 MG tablet, Take 5 mg by mouth daily., Disp: , Rfl:  .  budesonide-formoterol (SYMBICORT) 160-4.5 MCG/ACT inhaler, Inhale 2 puffs into the lungs 2 (two) times daily., Disp: 1 Inhaler, Rfl:  11 .  carvedilol (COREG) 3.125 MG tablet, TAKE 1 TABLET BY MOUTH TWICE DAILY WITH MEALS, Disp: 60 tablet, Rfl: 5 .  fluticasone (FLONASE) 50 MCG/ACT nasal spray, Place 2 sprays into both nostrils daily., Disp: 16 g, Rfl: 0 .  furosemide (LASIX) 20 MG tablet, Take 1 tablet (20 mg total) by mouth daily., Disp: 30 tablet, Rfl: 5 .  isosorbide-hydrALAZINE (BIDIL) 20-37.5 MG tablet, Take 1 tablet by mouth 3 (three) times daily. (Patient taking differently: Take 0.5 tablets by mouth 2 (two) times a day. ), Disp: 270 tablet, Rfl: 1 .  loratadine (CLARITIN) 10 MG tablet, Take 10 mg by mouth daily., Disp: , Rfl:  .  losartan (COZAAR) 25 MG tablet, Take 1 tablet (25 mg total) by mouth daily., Disp: 30 tablet, Rfl: 6 .  polyethylene glycol powder (GLYCOLAX/MIRALAX) powder, Take 17 g by mouth daily as needed for mild constipation., Disp: , Rfl:  .  dextromethorphan-guaiFENesin (MUCINEX DM) 30-600 MG 12hr tablet, Take 1 tablet by mouth 2 (two) times daily. (Patient not taking: Reported on 01/10/2019), Disp: 10 tablet, Rfl: 0 .  spironolactone (ALDACTONE) 25 MG tablet, Take 1 tablet (25 mg total) by mouth daily., Disp: 30 tablet, Rfl: 3 Allergies  Allergen Reactions  . Lisinopril Swelling    Facial and tongue swelling 10/2012      Social History   Socioeconomic History  . Marital status: Single    Spouse name: Not on file  . Number of children: Not on file  . Years of education: Not on file  .  Highest education level: Not on file  Occupational History  . Occupation: "it don't work for me", on disability  Social Needs  . Financial resource strain: Not on file  . Food insecurity    Worry: Not on file    Inability: Not on file  . Transportation needs    Medical: Not on file    Non-medical: Not on file  Tobacco Use  . Smoking status: Former Smoker    Packs/day: 0.10    Years: 45.00    Pack years: 4.50    Quit date: 2012    Years since quitting: 8.6  . Smokeless tobacco: Never Used  Substance  and Sexual Activity  . Alcohol use: No    Frequency: Never    Comment: Pt states no etoh since April 2014  . Drug use: No    Comment: Prior use of crack cocaine, quit 2012  . Sexual activity: Never  Lifestyle  . Physical activity    Days per week: Not on file    Minutes per session: Not on file  . Stress: Not on file  Relationships  . Social Herbalist on phone: Not on file    Gets together: Not on file    Attends religious service: Not on file    Active member of club or organization: Not on file    Attends meetings of clubs or organizations: Not on file    Relationship status: Not on file  . Intimate partner violence    Fear of current or ex partner: Not on file    Emotionally abused: Not on file    Physically abused: Not on file    Forced sexual activity: Not on file  Other Topics Concern  . Not on file  Social History Narrative  . Not on file    Physical Exam      Future Appointments  Date Time Provider [place]  03/30/2019 11:00 AM MC ECHO OP 1 [facility]  03/30/2019 12:00 PM Bensimhon, Shaune Pascal, MD [facility] None    BP (!) 144/72   Pulse 60   Temp (!) 97.3 F (36.3 C)   Resp 17   Wt 153 lb (69.4 kg)   SpO2 98%   BMI 21.34 kg/m   Weight yesterday-153 Last visit weight-153  Pt reports he is feeling great, he denies increased sob, he denies dizziness, this morning he took his meds without enough food and it caused his stomach to be upset.  But other than that he seems to be doing well, he ate bologna sandwich this morning, I cautioned him against that as that has so much sodium in it.  No edema noted.  He says he usually doesn't eat that but his friend left it here so it was quick and easy to fix.  His b/p is elevated this morning, taking all meds. He has a few to pick up from pharmacy today to complete his pill box.  He has been taking his bidil TID recently.  He has home b/p cuff and he reports readings around 193-790 systolic.  He  states if it b/p is too low then he wont take the bidil, there have been few days that he hasnt taken it TID due to lower home b/p readings.  He did not use his neb tx this morning yet, he does have slight wheezing in lungs, but he denies sob. He will use it shortly after I leave.  I advised him if he couldn't get  to pharmacy today to pick up meds let me know and I will assist.   Marylouise Stacks, [place] Paramedic  03/10/19

## 2019-03-30 ENCOUNTER — Ambulatory Visit (HOSPITAL_COMMUNITY)
Admission: RE | Admit: 2019-03-30 | Discharge: 2019-03-30 | Disposition: A | Discharge status: Home / Self Care | Payer: Medicare Other | Source: Ambulatory Visit | Attending: Internal Medicine | Admitting: Internal Medicine

## 2019-03-30 ENCOUNTER — Encounter (HOSPITAL_COMMUNITY): Payer: Self-pay | Admitting: Internal Medicine

## 2019-03-30 ENCOUNTER — Other Ambulatory Visit (HOSPITAL_COMMUNITY): Payer: Self-pay

## 2019-03-30 ENCOUNTER — Ambulatory Visit (HOSPITAL_BASED_OUTPATIENT_CLINIC_OR_DEPARTMENT_OTHER)
Admission: RE | Admit: 2019-03-30 | Discharge: 2019-03-30 | Disposition: A | Discharge status: Home / Self Care | Payer: Medicare Other | Source: Ambulatory Visit | Attending: Internal Medicine | Admitting: Internal Medicine

## 2019-03-30 ENCOUNTER — Other Ambulatory Visit: Payer: Self-pay

## 2019-03-30 ENCOUNTER — Ambulatory Visit (HOSPITAL_COMMUNITY): Payer: Medicare Other

## 2019-03-30 VITALS — BP 146/68 | HR 58 | Wt 162.2 lb

## 2019-03-30 DIAGNOSIS — Z888 Allergy status to other drugs, medicaments and biological substances status: Secondary | ICD-10-CM | POA: Diagnosis not present

## 2019-03-30 DIAGNOSIS — Z79899 Other long term (current) drug therapy: Secondary | ICD-10-CM | POA: Insufficient documentation

## 2019-03-30 DIAGNOSIS — I493 Ventricular premature depolarization: Secondary | ICD-10-CM

## 2019-03-30 DIAGNOSIS — Z8249 Family history of ischemic heart disease and other diseases of the circulatory system: Secondary | ICD-10-CM | POA: Diagnosis not present

## 2019-03-30 DIAGNOSIS — I5022 Chronic systolic (congestive) heart failure: Secondary | ICD-10-CM | POA: Insufficient documentation

## 2019-03-30 DIAGNOSIS — Z7951 Long term (current) use of inhaled steroids: Secondary | ICD-10-CM | POA: Diagnosis not present

## 2019-03-30 DIAGNOSIS — J449 Chronic obstructive pulmonary disease, unspecified: Secondary | ICD-10-CM | POA: Insufficient documentation

## 2019-03-30 DIAGNOSIS — I428 Other cardiomyopathies: Secondary | ICD-10-CM | POA: Diagnosis not present

## 2019-03-30 DIAGNOSIS — Z87891 Personal history of nicotine dependence: Secondary | ICD-10-CM | POA: Diagnosis not present

## 2019-03-30 DIAGNOSIS — Q600 Renal agenesis, unilateral: Secondary | ICD-10-CM

## 2019-03-30 DIAGNOSIS — N182 Chronic kidney disease, stage 2 (mild): Secondary | ICD-10-CM | POA: Diagnosis not present

## 2019-03-30 DIAGNOSIS — I5042 Chronic combined systolic (congestive) and diastolic (congestive) heart failure: Secondary | ICD-10-CM

## 2019-03-30 DIAGNOSIS — E785 Hyperlipidemia, unspecified: Secondary | ICD-10-CM | POA: Insufficient documentation

## 2019-03-30 DIAGNOSIS — I110 Hypertensive heart disease with heart failure: Secondary | ICD-10-CM | POA: Insufficient documentation

## 2019-03-30 LAB — COMPREHENSIVE METABOLIC PANEL
ALT: 11 U/L (ref 0–44)
AST: 14 U/L — ABNORMAL LOW (ref 15–41)
Albumin: 3.5 g/dL (ref 3.5–5.0)
Alkaline Phosphatase: 77 U/L (ref 38–126)
Anion gap: 12 (ref 5–15)
BUN: 17 mg/dL (ref 8–23)
CO2: 20 mmol/L — ABNORMAL LOW (ref 22–32)
Calcium: 8.7 mg/dL — ABNORMAL LOW (ref 8.9–10.3)
Chloride: 105 mmol/L (ref 98–111)
Creatinine, Ser: 1.65 mg/dL — ABNORMAL HIGH (ref 0.61–1.24)
GFR calc Af Amer: 50 mL/min — ABNORMAL LOW (ref >60)
GFR calc non Af Amer: 43 mL/min — ABNORMAL LOW (ref >60)
Glucose, Bld: 98 mg/dL (ref 70–99)
Potassium: 3.9 mmol/L (ref 3.5–5.1)
Sodium: 137 mmol/L (ref 135–145)
Total Bilirubin: 0.8 mg/dL (ref 0.3–1.2)
Total Protein: 6.7 g/dL (ref 6.5–8.1)

## 2019-03-30 LAB — TSH: TSH: 0.7 u[IU]/mL (ref 0.350–4.500)

## 2019-03-30 LAB — T4, FREE: Free T4: 1.41 ng/dL — ABNORMAL HIGH (ref 0.61–1.12)

## 2019-03-30 MED ORDER — AMIODARONE HCL 100 MG PO TABS: 100.0000 mg | ORAL_TABLET | Freq: Every day | ORAL | 6 refills | Status: DC

## 2019-03-30 NOTE — Progress Notes (Signed)
Paramedicine Encounter   Patient ID: Joe Garcia , male,   DOB: [birth year],67 y.o.,  MRN: [id number]   Met patient in clinic today with provider.  Weight @ clinic-162  B/p-160/88 B/p recheck-146/68 p-58 [id number] Weight @ home 149-153  Pt reports doing ok, his b/p elevated in clinic, he reports he ate 2 pieces of fried chicken last night for supper. He also is having some abd pains.  He missed his ECHO appoint today--they had different number to reach him at and didn't get through and the schedule I looked at to write down was for his clinic appoint so I overlooked it as well.  Pt has been feeling alright, he states he has been rolling busy! He states he gets sob sometimes but he takes a break and keeps going.  Pt denies c/p, pt states he has gastritis and abd pains and has appoint for that on Friday.  Pt amio decreased to 161m daily.  Will f/u next week.  KMarylouise Stacks EConstantine9/08/2018

## 2019-03-30 NOTE — Patient Instructions (Signed)
DECREASE Amiodarone to 100mg  (1 tab) daily  Labs today We will only contact you if something comes back abnormal or we need to make some changes. Otherwise no news is good news!  Your physician has recommended that you have a pulmonary function test. Pulmonary Function Tests are a group of tests that measure how well air moves in and out of your lungs.  You will be called to schedule this test  Your physician recommends that you schedule a follow-up appointment in: 3-4 months with Dr [name]  At the [facility], you and your health needs are our priority. As part of our continuing mission to provide you with exceptional heart care, we have created designated Provider Care Teams. These Care Teams include your primary Cardiologist (physician) and Advanced Practice Providers (APPs- Physician Assistants and Nurse Practitioners) who all work together to provide you with the care you need, when you need it.   You may see any of the following providers on your designated Care Team at your next follow up: Marland Kitchen Dr Glori Bickers . Dr Loralie Champagne . Darrick Grinder, NP   Please be sure to bring in all your medications bottles to every appointment.

## 2019-03-30 NOTE — Progress Notes (Addendum)
Advanced Heart Failure Clinic Note   Date:  03/30/2019   ID:  KENI ARENDT, DOB 1951-06-30, MRN [id number]  Location: Home  Provider location: [place] Advanced [facility] Failure Type of Visit: Established patient   PCP:  Medicine, [facility]  Cardiologist:  No primary care provider on file. Primary HF: Dr [name].   Chief Complaint: Heart Failure.    History of Present Illness: ADIN TROUPE is a 68 y.o. 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 Former crack cocaine abuse.   Admitted 11/14 - 06/15/17 with CP and DOE. Received steroids and nebs. BNP minimally elevated on arrival. Troponin negative. R/LHC with normal cors and compensated hemodynamics. Echo with EF 30-35% Frequent PVCs identified so started on amiodarone.   Seen in ED 09/09/17 with N/V and "chest pain". CP felt like his normal reflux. EKG with PVCs and NSR. Had been seen at home by paramedicine and told HR was in 30s, but normal in 60s on monitor. Likely undercounting due to PVCs. Work up unremarkable.   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   Readmitted 10/03/18 with chest pain. Cardiac enzymes minimally elevated but with EKG changes he was set up for LHC. 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 ECHO completed and showed EF 30-35% and normal RV function. .  Presents for routine f/u. Has been followed by Paramedicine  Says he is doing pretty good. Now on Bildil 1/2 tab tid. Got dizzy with whole tablet. Says he is SOB due to his asthma. Can do ADLs without problem. SOB with steps or having to hurry. + Wheezing. No CP, edema, orthopnea or PND. No smoking x 15 years.     ECHO 10/04/2018 EF 30-35%.  The cavity size was mildly dilated. There is no increase in left ventricular wall thickness. Left ventricular diastolic Doppler  parameters are consistent with impaired  relaxation. Right Ventricle: The right ventricle has normal systolic function. The cavity was normal. There is no increase in right ventricular wall thickness. Left Atrium: left atrial size was mildly dilated Right Atrium: right atrial size was normal in size. Right atrial pressure is estimated at 3 mmHg. Interatrial Septum: No atrial level shunt detected by color flow Doppler. Pericardium: There is no evidence of pericardial effusion. Mitral Valve: The mitral valve is normal in structure. Mitral valve regurgitation is mild by color flow Doppler. Tricuspid Valve: The tricuspid valve is normal in structure. Tricuspid valve regurgitation is trivial by color flow Doppler. Aortic Valve: The aortic valve is tricuspid Mild calcification of the aortic valve. Aortic valve regurgitation is trivial by color flow Doppler. There is no stenosis of the aortic valve. Pulmonic Valve: The pulmonic valve was normal in structure. Pulmonic valve regurgitation is trivial by color flow Doppler. Aorta: The aortic root and ascending aorta are normal in size and structure. Venous: The inferior vena cava is normal in size with greater than 50% respiratory variability.     Past Medical History:  Diagnosis Date  . Alcoholism ([place])   . Arthritis    hips, L shoulder  . Asthma   . CHF (congestive heart failure) ([place])   . Depression   . Family history of anesthesia complication   . Hyperlipidemia   . Hypertension    Past Surgical History:  Procedure Laterality Date  . LEFT HEART CATH AND CORONARY ANGIOGRAPHY N/A 10/04/2018  Procedure: LEFT HEART CATH AND CORONARY ANGIOGRAPHY;  Surgeon: Jettie Booze, MD;  Location: [facility];  Service: Cardiovascular;  Laterality: N/A;  . RIGHT/LEFT HEART CATH AND CORONARY ANGIOGRAPHY N/A 06/12/2017   Procedure: RIGHT/LEFT HEART CATH AND CORONARY ANGIOGRAPHY;  Surgeon: Jolaine Artist, MD;  Location: [facility];  Service: Cardiovascular;  Laterality: N/A;      Current Outpatient Medications  Medication Sig Dispense Refill  . acetaminophen (TYLENOL) 500 MG tablet Take 2 tablets (1,000 mg total) by mouth 3 (three) times daily as needed for headache (pain). 30 tablet 0  . albuterol (PROVENTIL) (2.5 MG/3ML) 0.083% nebulizer solution Take 3 mLs (2.5 mg total) by nebulization every 6 (six) hours as needed for wheezing or shortness of breath. 75 mL 0  . albuterol (VENTOLIN HFA) 108 (90 Base) MCG/ACT inhaler Inhale 1-2 puffs into the lungs every 4 (four) hours as needed for wheezing or shortness of breath. 18 g 0  . amiodarone (PACERONE) 200 MG tablet Take 1 tablet (200 mg total) by mouth daily. 90 tablet 3  . amLODipine (NORVASC) 5 MG tablet Take 5 mg by mouth daily.    . budesonide-formoterol (SYMBICORT) 160-4.5 MCG/ACT inhaler Inhale 2 puffs into the lungs 2 (two) times daily. 1 Inhaler 11  . carvedilol (COREG) 3.125 MG tablet TAKE 1 TABLET BY MOUTH TWICE DAILY WITH MEALS 60 tablet 5  . dextromethorphan-guaiFENesin (MUCINEX DM) 30-600 MG 12hr tablet Take 1 tablet by mouth 2 (two) times daily. (Patient not taking: Reported on 01/10/2019) 10 tablet 0  . fluticasone (FLONASE) 50 MCG/ACT nasal spray Place 2 sprays into both nostrils daily. 16 g 0  . furosemide (LASIX) 20 MG tablet Take 1 tablet (20 mg total) by mouth daily. 30 tablet 5  . isosorbide-hydrALAZINE (BIDIL) 20-37.5 MG tablet Take 1 tablet by mouth 3 (three) times daily. (Patient taking differently: Take 0.5 tablets by mouth 2 (two) times a day. ) 270 tablet 1  . loratadine (CLARITIN) 10 MG tablet Take 10 mg by mouth daily.    Marland Kitchen losartan (COZAAR) 25 MG tablet Take 1 tablet (25 mg total) by mouth daily. 30 tablet 6  . polyethylene glycol powder (GLYCOLAX/MIRALAX) powder Take 17 g by mouth daily as needed for mild constipation.    Marland Kitchen spironolactone (ALDACTONE) 25 MG tablet Take 1 tablet (25 mg total) by mouth daily. 30 tablet 3   No current facility-administered medications for this encounter.      Allergies:   Lisinopril   Social History:  The patient  reports that he quit smoking about 8 years ago. He has a 4.50 pack-year smoking history. He has never used smokeless tobacco. He reports that he does not drink alcohol or use drugs.   Family History:  The patient's family history includes Asthma in his son; Canavan disease in his daughter; Cancer in his daughter; Heart disease in his daughter and father; Heart failure in his mother; Kidney failure in his mother.   Vitals:   03/30/19 1225  BP: (!) 146/68  Pulse: (!) 58  SpO2: 96%   General:  Well appearing. No resp difficulty HEENT: normal Neck: supple. no JVD. Carotids 2+ bilat; no bruits. No lymphadenopathy or thryomegaly appreciated. Cor: PMI nondisplaced. Regular rate & rhythm. No rubs, gallops or murmurs. Lungs: clear with prolonged exp phase. No wheeze Abdomen: soft, nontender, nondistended. No hepatosplenomegaly. No bruits or masses. Good bowel sounds. Extremities: no cyanosis, clubbing, rash, edema Neuro: alert & orientedx3, cranial nerves grossly intact. moves all 4  extremities w/o difficulty. Affect pleasant   Recent Labs: 11/24/2018: B Natriuretic Peptide 146.5; Hemoglobin 13.0; Platelets 319 12/21/2018: ALT 19; BUN 25; Creatinine, Ser 1.76; Potassium 4.8; Sodium 136; TSH 2.114  Personally reviewed   Wt Readings from Last 3 Encounters:  03/10/19 69.4 kg (153 lb)  02/16/19 69.4 kg (153 lb)  02/08/19 69.9 kg (154 lb)      ASSESSMENT AND PLAN:  1. Chronic Systolic Heart Failure - NICM. 09/2018 LHC no CAD. ECHO 09/2018 EF 30-35%. - Stable NYHA III. Volume status stable. Continue 20 mg lasix daily.  - Continue spironolactone 25 mg daily.  - Continue bidil 1/2 tab twice a day. Unable to tolerate higher dose - Continue carvedilol 3.125 mg twice a day. Will not increase with heart rate of 56.  - Continue losartan 25 daily. - Not on entresto due to angioedema noted on lisinopril.  - Continue HF  Paramedicine. - Discussed need for ICD. He was not sure if he wanted to proceed. Will discuss again at next visit  2. COPD, Former Smoker  - Quit 7 years ago.  - Inhalers per PCP - PFTs ordered in 4/20 but not performed. Will re-order.  3. Low literacy.  - Barrier to medication management. Continue HF Parramedicineresumed today.  4. HTN  - BP high here but has been well controlled with Paramedcine reads at home   5.Frequent PVCs - On amiodarone 200 mg daily to suppress PVCs since 2018.  - Decrease amio to 100 daily -check labs and PFTs   6. Solitary Kidney  - check BMET     Signed, Glori Bickers, MD  03/30/2019 11:58 AM  [facility] [address] [facility] [place] [address] [phone] (office) [phone] (fax)

## 2019-03-31 ENCOUNTER — Telehealth (HOSPITAL_COMMUNITY): Payer: Self-pay

## 2019-03-31 LAB — T3, FREE: T3, Free: 2.2 pg/mL (ref 2.0–4.4)

## 2019-03-31 NOTE — Telephone Encounter (Signed)
Confirmed with PT contact number on file is correct and advised of next scheduled appointment with Advanced Heart Failure Clinic.

## 2019-04-06 ENCOUNTER — Telehealth (HOSPITAL_COMMUNITY): Payer: Self-pay

## 2019-04-06 NOTE — Telephone Encounter (Signed)
Pt contacted me yesterday stating he had dental appointment to get his tooth pulled as it had been causing him so much pain lately.  I called him this morning to see how he is feeling today and if he is up for a visit today, he said he didn't get to dental appoint on time due to the bus route so he was told to come back on Friday.  He does not feel up to home visit today, he would like for me to call him on Monday to f/u and sch appointment. He states he has all his meds and is doing ok on that part.   Marylouise Stacks, EMT-Paramedic  04/06/19

## 2019-04-13 ENCOUNTER — Other Ambulatory Visit (HOSPITAL_COMMUNITY): Payer: Self-pay

## 2019-04-13 MED ORDER — CARVEDILOL 3.125 MG PO TABS: 3.1250 mg | ORAL_TABLET | Freq: Two times a day (BID) | ORAL | 5 refills | Status: DC

## 2019-04-15 ENCOUNTER — Other Ambulatory Visit (HOSPITAL_COMMUNITY): Payer: Self-pay

## 2019-04-15 MED ORDER — CARVEDILOL 3.125 MG PO TABS: 3.1250 mg | ORAL_TABLET | Freq: Two times a day (BID) | ORAL | 5 refills | Status: DC

## 2019-04-20 ENCOUNTER — Other Ambulatory Visit (HOSPITAL_COMMUNITY): Payer: Self-pay

## 2019-04-20 NOTE — Progress Notes (Signed)
Paramedicine Encounter    Patient ID: Joe Garcia, male    DOB: Jun 17, 1951, 68 y.o.   MRN: [id number]   Patient Care Team: Medicine, [facility] as PCP - General [name], LCSW as Social Worker (Licensed Clinical Social Worker)  Patient Active Problem List   Diagnosis Date Noted  . COPD GOLD ?  11/24/2018  . Chest pain   . NSTEMI (non-ST elevated myocardial infarction) ([place]) 10/03/2018  . COPD with acute exacerbation ([place]) 07/26/2018  . Weight loss, unintentional 07/26/2018  . Malnutrition of moderate degree 07/16/2017  . 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 Solitary kidney, congenital 06/23/2017  . Nonischemic cardiomyopathy ([place])   . Multifocal PVCs   . Chronic combined systolic (congestive) and diastolic (congestive) heart failure ([place]) 06/11/2017  . DOE (dyspnea on exertion) 07/16/2013  . Alcohol abuse 09/25/2012  . Alcohol dependence ([place]) 09/22/2012  . Essential hypertension 03/22/2007  . DEGENERATIVE DISC DISEASE 03/22/2007  . AVASCULAR NECROSIS 03/22/2007    Current Outpatient Medications:  .  acetaminophen (TYLENOL) 500 MG tablet, Take 2 tablets (1,000 mg total) by mouth 3 (three) times daily as needed for headache (pain)., Disp: 30 tablet, Rfl: 0 .  albuterol (PROVENTIL) (2.5 MG/3ML) 0.083% nebulizer solution, Take 3 mLs (2.5 mg total) by nebulization every 6 (six) hours as needed for wheezing or shortness of breath., Disp: 75 mL, Rfl: 0 .  albuterol (VENTOLIN HFA) 108 (90 Base) MCG/ACT inhaler, Inhale 1-2 puffs into the lungs every 4 (four) hours as needed for wheezing or shortness of breath., Disp: 18 g, Rfl: 0 .  amiodarone (PACERONE) 100 MG tablet, Take 1 tablet (100 mg total) by mouth daily., Disp: 30 tablet, Rfl: 6 .  amLODipine (NORVASC) 5 MG tablet, Take 5 mg by mouth daily., Disp: , Rfl:  .  budesonide-formoterol (SYMBICORT) 160-4.5 MCG/ACT inhaler, Inhale 2 puffs into the lungs 2 (two) times daily., Disp: 1 Inhaler, Rfl:  11 .  carvedilol (COREG) 3.125 MG tablet, Take 1 tablet (3.125 mg total) by mouth 2 (two) times daily with a meal., Disp: 60 tablet, Rfl: 5 .  fluticasone (FLONASE) 50 MCG/ACT nasal spray, Place 2 sprays into both nostrils daily., Disp: 16 g, Rfl: 0 .  furosemide (LASIX) 20 MG tablet, Take 1 tablet (20 mg total) by mouth daily., Disp: 30 tablet, Rfl: 5 .  isosorbide-hydrALAZINE (BIDIL) 20-37.5 MG tablet, Take 1 tablet by mouth 2 (two) times daily. , Disp: , Rfl:  .  losartan (COZAAR) 25 MG tablet, Take 1 tablet (25 mg total) by mouth daily., Disp: 30 tablet, Rfl: 6 .  montelukast (SINGULAIR) 10 MG tablet, Take 10 mg by mouth at bedtime., Disp: , Rfl:  .  polyethylene glycol powder (GLYCOLAX/MIRALAX) powder, Take 17 g by mouth daily as needed for mild constipation., Disp: , Rfl:  .  loratadine (CLARITIN) 10 MG tablet, Take 10 mg by mouth daily., Disp: , Rfl:  .  spironolactone (ALDACTONE) 25 MG tablet, Take 1 tablet (25 mg total) by mouth daily., Disp: 30 tablet, Rfl: 3 Allergies  Allergen Reactions  . Lisinopril Swelling    Facial and tongue swelling 10/2012      Social History   Socioeconomic History  . Marital status: Single    Spouse name: Not on file  . Number of children: Not on file  . Years of education: Not on file  . Highest education level: Not on file  Occupational History  . Occupation: "it don't work for me", on  disability  Social Needs  . Financial resource strain: Not on file  . Food insecurity    Worry: Not on file    Inability: Not on file  . Transportation needs    Medical: Not on file    Non-medical: Not on file  Tobacco Use  . Smoking status: Former Smoker    Packs/day: 0.10    Years: 45.00    Pack years: 4.50    Quit date: 2012    Years since quitting: 8.7  . Smokeless tobacco: Never Used  Substance and Sexual Activity  . Alcohol use: No    Frequency: Never    Comment: Pt states no etoh since April 2014  . Drug use: No    Comment: Prior use of  crack cocaine, quit 2012  . Sexual activity: Never  Lifestyle  . Physical activity    Days per week: Not on file    Minutes per session: Not on file  . Stress: Not on file  Relationships  . Social Herbalist on phone: Not on file    Gets together: Not on file    Attends religious service: Not on file    Active member of club or organization: Not on file    Attends meetings of clubs or organizations: Not on file    Relationship status: Not on file  . Intimate partner violence    Fear of current or ex partner: Not on file    Emotionally abused: Not on file    Physically abused: Not on file    Forced sexual activity: Not on file  Other Topics Concern  . Not on file  Social History Narrative  . Not on file    Physical Exam      Future Appointments  Date Time Provider [place]  04/22/2019 10:05 AM MC-SCREENING [facility]-SDSC None  04/27/2019 10:00 AM MC-RESPTX TECH [facility]-RES[facility] None  06/29/2019  1:40 PM Bensimhon, Shaune Pascal, MD [facility] None    BP 140/72   Pulse 62   Temp 97.8 F (36.6 C)   Resp 18   Wt 147 lb (66.7 kg)   SpO2 98%   BMI 20.50 kg/m   Weight yesterday-146 Last visit weight-162 @ clinic   Pt reports that he is doing well. He is active when he can and weather permitting.  He had tooth pulled last week and feels better. He was taking antibiotic but quit taking it-not took it in 1-2 days. Advised him to keep taking it as prescribed which was until it was gone.  So he will continue with that.  meds reviewed. He fills own pill box.  He just picked up the losartan.  That was placed in pill box.  His weight is down from a few wks ago--he was struggling with the toothache and didn't have much of an appetite during that time but his appetite is coming back. He states he had good supper last night.  He reports breathing doing ok. He does have wheezing all over but denies sob. He states with the weather changing he has had to use breathing tx twice a day.   He will start the singulair tonight as well.  He would like to continue paramedicine services to be seen every couple wks.   Marylouise Stacks, [facility] [facility] Paramedic  04/20/19

## 2019-04-22 ENCOUNTER — Inpatient Hospital Stay (HOSPITAL_COMMUNITY): Admission: RE | Admit: 2019-04-22 | Payer: Medicare Other | Source: Ambulatory Visit

## 2019-04-25 ENCOUNTER — Other Ambulatory Visit (HOSPITAL_COMMUNITY)
Admission: RE | Admit: 2019-04-25 | Discharge: 2019-04-25 | Disposition: A | Discharge status: Home / Self Care | Payer: Medicare Other | Source: Ambulatory Visit | Attending: Internal Medicine | Admitting: Internal Medicine

## 2019-04-25 DIAGNOSIS — Z01812 Encounter for preprocedural laboratory examination: Secondary | ICD-10-CM | POA: Insufficient documentation

## 2019-04-25 DIAGNOSIS — Z20828 Contact with and (suspected) exposure to other viral communicable diseases: Secondary | ICD-10-CM | POA: Insufficient documentation

## 2019-04-25 LAB — SARS CORONAVIRUS 2 (TAT 6-24 HRS): SARS Coronavirus 2: NEGATIVE

## 2019-04-27 ENCOUNTER — Other Ambulatory Visit: Payer: Self-pay

## 2019-04-27 ENCOUNTER — Ambulatory Visit (HOSPITAL_COMMUNITY)
Admission: RE | Admit: 2019-04-27 | Discharge: 2019-04-27 | Disposition: A | Discharge status: Home / Self Care | Payer: Medicare Other | Source: Ambulatory Visit | Attending: Internal Medicine | Admitting: Internal Medicine

## 2019-04-27 DIAGNOSIS — I5042 Chronic combined systolic (congestive) and diastolic (congestive) heart failure: Secondary | ICD-10-CM | POA: Diagnosis not present

## 2019-04-27 LAB — PULMONARY FUNCTION TEST
DL/VA % pred: 86 %
DL/VA: 3.54 ml/min/mmHg/L
DLCO unc % pred: 71 %
DLCO unc: 19.72 ml/min/mmHg
FEF 25-75 Post: 1.16 L/sec
FEF 25-75 Pre: 0.68 L/sec
FEF2575-%Change-Post: 71 %
FEF2575-%Pred-Post: 41 %
FEF2575-%Pred-Pre: 24 %
FEV1-%Change-Post: 17 %
FEV1-%Pred-Post: 51 %
FEV1-%Pred-Pre: 43 %
FEV1-Post: 1.61 L
FEV1-Pre: 1.37 L
FEV1FVC-%Change-Post: -2 %
FEV1FVC-%Pred-Pre: 78 %
FEV6-%Change-Post: 20 %
FEV6-%Pred-Post: 68 %
FEV6-%Pred-Pre: 57 %
FEV6-Post: 2.7 L
FEV6-Pre: 2.25 L
FEV6FVC-%Pred-Post: 104 %
FEV6FVC-%Pred-Pre: 104 %
FVC-%Change-Post: 20 %
FVC-%Pred-Post: 66 %
FVC-%Pred-Pre: 55 %
FVC-Post: 2.7 L
FVC-Pre: 2.25 L
Post FEV1/FVC ratio: 60 %
Post FEV6/FVC ratio: 100 %
Pre FEV1/FVC ratio: 61 %
Pre FEV6/FVC Ratio: 100 %
RV % pred: 300 %
RV: 7.22 L
TLC % pred: 139 %
TLC: 10.07 L

## 2019-04-27 MED ORDER — ALBUTEROL SULFATE (2.5 MG/3ML) 0.083% IN NEBU
2.5000 mg | INHALATION_SOLUTION | Freq: Once | RESPIRATORY_TRACT | Status: AC
  Administered 2019-04-27: 09:00:00 2.5 mg via RESPIRATORY_TRACT

## 2019-05-16 ENCOUNTER — Telehealth (HOSPITAL_COMMUNITY): Payer: Self-pay

## 2019-05-19 NOTE — Telephone Encounter (Signed)
Called pt regarding home visit but no answer.   Joe Garcia, EMT-Paramedic  05/19/19

## 2019-05-29 DIAGNOSIS — C801 Malignant (primary) neoplasm, unspecified: Secondary | ICD-10-CM

## 2019-05-29 HISTORY — DX: Malignant (primary) neoplasm, unspecified: C80.1

## 2019-05-30 ENCOUNTER — Other Ambulatory Visit (HOSPITAL_COMMUNITY): Payer: Self-pay | Admitting: Student

## 2019-05-31 ENCOUNTER — Telehealth (HOSPITAL_COMMUNITY): Payer: Self-pay

## 2019-05-31 NOTE — Telephone Encounter (Signed)
I spoke to pt regarding home visit this week-he states he is going out of town until next week. He asked I call back then.  He called me back needing assistance getting refill on 2 of his meds-I called pharmacy and he advised it was spiro he needed doc auth to fill. Due to time of day I will contact clinic in the morning requesting they fill this med as he is leaving to go out of town tomor.   Marylouise Stacks, EMT-Paramedic  05/31/19

## 2019-06-01 ENCOUNTER — Other Ambulatory Visit (HOSPITAL_COMMUNITY): Payer: Self-pay | Admitting: *Deleted

## 2019-06-01 MED ORDER — SPIRONOLACTONE 25 MG PO TABS: 25.0000 mg | ORAL_TABLET | Freq: Every day | ORAL | 3 refills | Status: DC

## 2019-06-07 ENCOUNTER — Telehealth (HOSPITAL_COMMUNITY): Payer: Self-pay

## 2019-06-07 NOTE — Telephone Encounter (Signed)
LVM for pt to return my call for a home visit this week per his request last week as he was going out of town.   Marylouise Stacks, EMT -Paramedic  06/07/19

## 2019-06-16 ENCOUNTER — Telehealth (HOSPITAL_COMMUNITY): Payer: Self-pay

## 2019-06-16 NOTE — Telephone Encounter (Signed)
I was sch to see pt yesterday morning, he had called prior to me getting in the office and when I returned the call he did not answer. The VM he left for me stated he would be home the next day and I could come out then.  When I called him this morning there was no answer again.   Marylouise Stacks, EMT-Paramedic  06/16/19

## 2019-06-20 ENCOUNTER — Ambulatory Visit (INDEPENDENT_AMBULATORY_CARE_PROVIDER_SITE_OTHER): Payer: Medicare Other

## 2019-06-20 ENCOUNTER — Encounter (HOSPITAL_COMMUNITY): Payer: Self-pay | Admitting: Emergency Medicine

## 2019-06-20 ENCOUNTER — Other Ambulatory Visit: Payer: Self-pay

## 2019-06-20 ENCOUNTER — Ambulatory Visit (HOSPITAL_COMMUNITY)
Admission: EM | Admit: 2019-06-20 | Discharge: 2019-06-20 | Disposition: A | Discharge status: Home / Self Care | Payer: Medicare Other | Attending: Family Medicine | Admitting: Family Medicine

## 2019-06-20 DIAGNOSIS — J441 Chronic obstructive pulmonary disease with (acute) exacerbation: Secondary | ICD-10-CM | POA: Diagnosis not present

## 2019-06-20 MED ORDER — ALBUTEROL SULFATE (2.5 MG/3ML) 0.083% IN NEBU: 2.5000 mg | INHALATION_SOLUTION | Freq: Four times a day (QID) | RESPIRATORY_TRACT | 0 refills | Status: DC | PRN

## 2019-06-20 MED ORDER — ALBUTEROL SULFATE HFA 108 (90 BASE) MCG/ACT IN AERS
INHALATION_SPRAY | RESPIRATORY_TRACT | Status: AC
  Filled 2019-06-20: qty 6.7

## 2019-06-20 MED ORDER — METHYLPREDNISOLONE SODIUM SUCC 125 MG IJ SOLR
INTRAMUSCULAR | Status: AC
  Filled 2019-06-20: qty 2

## 2019-06-20 MED ORDER — METHYLPREDNISOLONE SODIUM SUCC 125 MG IJ SOLR
80.0000 mg | Freq: Once | INTRAMUSCULAR | Status: AC
  Administered 2019-06-20: 13:00:00 80 mg via INTRAMUSCULAR

## 2019-06-20 MED ORDER — PREDNISONE 10 MG PO TABS: 40.0000 mg | ORAL_TABLET | Freq: Every day | ORAL | 0 refills | Status: AC

## 2019-06-20 NOTE — ED Triage Notes (Addendum)
Pt reports chest congestion and SOB that started on Saturday.  Pt states his nebulizer treatments helped the last two days, but today he is feeling much worse.  Pt states that he is coughing so much it is making him vomit.  Pt denies fever, body aches, or loss of taste or smell.  Pt has a nebulizer and two inhalers at home but he states they are not helping much today.  Pt is winded when talking.

## 2019-06-20 NOTE — ED Provider Notes (Signed)
[place]    CSN: [id number] Arrival date & time: 06/20/19  1137      History   Chief Complaint Chief Complaint  Patient presents with  . Chest Congestion  . Asthma    HPI Joe Garcia is a 68 y.o. male.   Patient is a 68 year old male past medical history of alcoholism, arthritis, asthma, CHF, depression, hyperlipidemia, hypertension, COPD, degenerative disc disease, cardiomyopathy.  He presents today with chest congestion, increased sputum and shortness of breath that started 3 days ago.  Symptoms have been constant and worsening.  He has been doing nebulizer treatments with mild relief.  He is already uses albuterol inhaler 6 times this morning.  He has had some posttussive vomiting.  Denies any associated fever, chills, body aches, loss of taste or smell, sore throat, ear pain or diarrhea.   ROS per HPI      Past Medical History:  Diagnosis Date  . Alcoholism ([place])   . Arthritis    hips, L shoulder  . Asthma   . CHF (congestive heart failure) ([place])   . Depression   . Family history of anesthesia complication   . Hyperlipidemia   . Hypertension     Patient Active Problem List   Diagnosis Date Noted  . COPD GOLD ?  11/24/2018  . Chest pain   . NSTEMI (non-ST elevated myocardial infarction) ([place]) 10/03/2018  . COPD with acute exacerbation ([place]) 07/26/2018  . Weight loss, unintentional 07/26/2018  . Malnutrition of moderate degree 07/16/2017  . 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 Solitary kidney, congenital 06/23/2017  . Nonischemic cardiomyopathy ([place])   . Multifocal PVCs   . Chronic combined systolic (congestive) and diastolic (congestive) heart failure ([place]) 06/11/2017  . DOE (dyspnea on exertion) 07/16/2013  . Alcohol abuse 09/25/2012  . Alcohol dependence ([place]) 09/22/2012  . Essential hypertension 03/22/2007  . DEGENERATIVE DISC DISEASE 03/22/2007  . AVASCULAR NECROSIS 03/22/2007    Past Surgical History:   Procedure Laterality Date  . LEFT HEART CATH AND CORONARY ANGIOGRAPHY N/A 10/04/2018   Procedure: LEFT HEART CATH AND CORONARY ANGIOGRAPHY;  Surgeon: Jettie Booze, MD;  Location: [facility];  Service: Cardiovascular;  Laterality: N/A;  . RIGHT/LEFT HEART CATH AND CORONARY ANGIOGRAPHY N/A 06/12/2017   Procedure: RIGHT/LEFT HEART CATH AND CORONARY ANGIOGRAPHY;  Surgeon: Jolaine Artist, MD;  Location: [facility];  Service: Cardiovascular;  Laterality: N/A;       Home Medications    Prior to Admission medications   Medication Sig Start Date End Date Taking? Authorizing Provider  albuterol (VENTOLIN HFA) 108 (90 Base) MCG/ACT inhaler Inhale 1-2 puffs into the lungs every 4 (four) hours as needed for wheezing or shortness of breath. 01/29/19  Yes Hall-Potvin, [place], PA-C  amiodarone (PACERONE) 100 MG tablet Take 1 tablet (100 mg total) by mouth daily. 03/30/19  Yes Bensimhon, Shaune Pascal, MD  amLODipine (NORVASC) 5 MG tablet Take 5 mg by mouth daily.   Yes [provider]  budesonide-formoterol (SYMBICORT) 160-4.5 MCG/ACT inhaler Inhale 2 puffs into the lungs 2 (two) times daily. 11/23/18  Yes Tanda Rockers, MD  carvedilol (COREG) 3.125 MG tablet Take 1 tablet (3.125 mg total) by mouth 2 (two) times daily with a meal. 04/15/19  Yes Bensimhon, Shaune Pascal, MD  isosorbide-hydrALAZINE (BIDIL) 20-37.5 MG tablet Take 1 tablet by mouth 2 (two) times daily.    Yes [provider]  losartan (COZAAR) 25 MG tablet TAKE 1 TABLET  BY MOUTH EVERY DAY 05/30/19  Yes Bensimhon, Shaune Pascal, MD  montelukast (SINGULAIR) 10 MG tablet Take 10 mg by mouth at bedtime.   Yes [provider]  spironolactone (ALDACTONE) 25 MG tablet Take 1 tablet (25 mg total) by mouth daily. 06/01/19 08/30/19 Yes Clegg, Amy D, NP  acetaminophen (TYLENOL) 500 MG tablet Take 2 tablets (1,000 mg total) by mouth 3 (three) times daily as needed for headache (pain). 07/28/18   Dhungel, Nishant, MD   albuterol (PROVENTIL) (2.5 MG/3ML) 0.083% nebulizer solution Take 3 mLs (2.5 mg total) by nebulization every 6 (six) hours as needed for wheezing or shortness of breath. 06/20/19   Eshaal Duby, Tressia Miners A, NP  fluticasone (FLONASE) 50 MCG/ACT nasal spray Place 2 sprays into both nostrils daily. 02/23/18   Melynda Ripple, MD  furosemide (LASIX) 20 MG tablet Take 1 tablet (20 mg total) by mouth daily. 12/09/18   Clegg, Amy D, NP  loratadine (CLARITIN) 10 MG tablet Take 10 mg by mouth daily.    [provider]  polyethylene glycol powder (GLYCOLAX/MIRALAX) powder Take 17 g by mouth daily as needed for mild constipation.    [provider]  predniSONE (DELTASONE) 10 MG tablet Take 4 tablets (40 mg total) by mouth daily for 5 days. 06/20/19 06/25/19  Orvan July, NP    Family History Family History  Problem Relation Age of Onset  . Heart disease Father   . Heart disease Daughter        "fluid around heart"   . [place] disease Daughter   . Cancer Daughter        unsure of type  . Asthma Son   . Heart failure Mother   . Kidney failure Mother     Social History Social History   Tobacco Use  . Smoking status: Former Smoker    Packs/day: 0.10    Years: 45.00    Pack years: 4.50    Quit date: 2012    Years since quitting: 8.9  . Smokeless tobacco: Never Used  Substance Use Topics  . Alcohol use: No    Frequency: Never    Comment: Pt states no etoh since April 2014  . Drug use: No    Comment: Prior use of crack cocaine, quit 2012     Allergies   Lisinopril   Review of Systems Review of Systems   Physical Exam Triage Vital Signs ED Triage Vitals  Enc Vitals Group     BP 06/20/19 1225 (!) 156/74     Pulse Rate 06/20/19 1225 68     Resp 06/20/19 1225 (!) 24     Temp 06/20/19 1225 97.9 F (36.6 C)     Temp Source 06/20/19 1225 Oral     SpO2 06/20/19 1225 99 %     Weight --      Height --      Head Circumference --      Peak Flow --      Pain Score 06/20/19  1221 0     Pain Loc --      Pain Edu? --      Excl. in [place]? --    No data found.  Updated Vital Signs BP (!) 156/74 (BP Location: Left Arm)   Pulse 68   Temp 97.9 F (36.6 C) (Oral)   Resp (!) 24   SpO2 99%   Visual Acuity Right Eye Distance:   Left Eye Distance:   Bilateral Distance:    Right Eye Near:  Left Eye Near:    Bilateral Near:     Physical Exam Vitals signs and nursing note reviewed.  HENT:     Head: Normocephalic and atraumatic.     Nose: Nose normal.     Mouth/Throat:     Pharynx: Oropharynx is clear.  Eyes:     Conjunctiva/sclera: Conjunctivae normal.  Neck:     Musculoskeletal: Normal range of motion.  Cardiovascular:     Rate and Rhythm: Normal rate and regular rhythm.     Pulses: Normal pulses.     Heart sounds: Normal heart sounds.  Pulmonary:     Effort: Tachypnea, accessory muscle usage and prolonged expiration present.     Breath sounds: Decreased air movement present.     Comments: Diffuse expiratory wheezing throughout and decreased lung sounds.   Musculoskeletal: Normal range of motion.  Skin:    General: Skin is warm and dry.  Neurological:     Mental Status: He is alert.  Psychiatric:        Mood and Affect: Mood normal.      UC Treatments / Results  Labs (all labs ordered are listed, but only abnormal results are displayed) Labs Reviewed - No data to display  EKG   Radiology Dg Chest 2 View  Result Date: 06/20/2019 CLINICAL DATA:  Cough and shortness of breath EXAM: CHEST - 2 VIEW COMPARISON:  January 29, 2019 FINDINGS: Lungs remain hyperexpanded with prominence of the retrosternal clear space. There is no edema or consolidation. Heart size and pulmonary vascularity are normal. No adenopathy. There is degenerative change in the lumbar spine. There is slight anterior wedging of several midthoracic vertebral bodies, stable. IMPRESSION: Hyperexpansion with prominence of the retrosternal clear space. These are findings indicative  of underlying emphysematous change. No edema or consolidation. Stable cardiac silhouette. Electronically Signed   By: Lowella Grip III M.D.   On: 06/20/2019 13:32    Procedures Procedures (including critical care time)  Medications Ordered in UC Medications  methylPREDNISolone sodium succinate (SOLU-MEDROL) 125 mg/2 mL injection 80 mg (80 mg Intramuscular Given 06/20/19 1257)  methylPREDNISolone sodium succinate (SOLU-MEDROL) 125 mg/2 mL injection (has no administration in time range)  albuterol (VENTOLIN HFA) 108 (90 Base) MCG/ACT inhaler (has no administration in time range)    Initial Impression / Assessment and Plan / UC Course  I have reviewed the triage vital signs and the nursing notes.  Pertinent labs & imaging results that were available during my care of the patient were reviewed by me and considered in my medical decision making (see chart for details).     COPD exacerbation-steroid injection given here in clinic Sent home with prednisone daily for 5 days Refilled nebulizer solution and gave albuterol inhaler here in clinic Recommended if symptoms continue or worsen he will need to go to the hospital. Pt understanding and agreed.  Final Clinical Impressions(s) / UC Diagnoses   Final diagnoses:  COPD exacerbation [facility])     Discharge Instructions     No signs of pneumonia on your x-ray. Treating you for an exacerbation of your COPD. Steroid injection given here today in clinic He can start the 5-day steroid burst tomorrow. I have refilled your nebulizer solution Your symptoms worsen to include worsening shortness of breath you need to go to the ER. Follow up as needed for continued or worsening symptoms     ED Prescriptions    Medication Sig Dispense Auth. Provider   albuterol (PROVENTIL) (2.5 MG/3ML) 0.083% nebulizer solution Take 3  mLs (2.5 mg total) by nebulization every 6 (six) hours as needed for wheezing or shortness of breath. 75 mL Anusha Claus A,  NP   predniSONE (DELTASONE) 10 MG tablet Take 4 tablets (40 mg total) by mouth daily for 5 days. 20 tablet Loura Halt A, NP     PDMP not reviewed this encounter.   Orvan July, NP 06/20/19 1641

## 2019-06-20 NOTE — Discharge Instructions (Signed)
No signs of pneumonia on your x-ray. Treating you for an exacerbation of your COPD. Steroid injection given here today in clinic He can start the 5-day steroid burst tomorrow. I have refilled your nebulizer solution Your symptoms worsen to include worsening shortness of breath you need to go to the ER. Follow up as needed for continued or worsening symptoms

## 2019-06-21 ENCOUNTER — Other Ambulatory Visit (HOSPITAL_COMMUNITY): Payer: Self-pay

## 2019-06-21 MED ORDER — AMIODARONE HCL 100 MG PO TABS: 100.0000 mg | ORAL_TABLET | Freq: Every day | ORAL | 6 refills | Status: DC

## 2019-06-26 ENCOUNTER — Emergency Department (HOSPITAL_COMMUNITY): Payer: Medicare Other

## 2019-06-26 ENCOUNTER — Observation Stay (HOSPITAL_COMMUNITY): Payer: Medicare Other

## 2019-06-26 ENCOUNTER — Inpatient Hospital Stay (HOSPITAL_COMMUNITY)
Admission: EM | Admit: 2019-06-26 | Discharge: 2019-07-04 | DRG: 330 | Disposition: A | Discharge status: Home / Self Care | Payer: Medicare Other | Attending: Family Medicine | Admitting: Family Medicine

## 2019-06-26 ENCOUNTER — Encounter (HOSPITAL_COMMUNITY): Payer: Self-pay | Admitting: Emergency Medicine

## 2019-06-26 ENCOUNTER — Other Ambulatory Visit: Payer: Self-pay

## 2019-06-26 DIAGNOSIS — Z8249 Family history of ischemic heart disease and other diseases of the circulatory system: Secondary | ICD-10-CM

## 2019-06-26 DIAGNOSIS — Z809 Family history of malignant neoplasm, unspecified: Secondary | ICD-10-CM

## 2019-06-26 DIAGNOSIS — I493 Ventricular premature depolarization: Secondary | ICD-10-CM | POA: Diagnosis present

## 2019-06-26 DIAGNOSIS — Z23 Encounter for immunization: Secondary | ICD-10-CM

## 2019-06-26 DIAGNOSIS — I700 Atherosclerosis of aorta: Secondary | ICD-10-CM | POA: Diagnosis present

## 2019-06-26 DIAGNOSIS — N1831 Chronic kidney disease, stage 3a: Secondary | ICD-10-CM | POA: Diagnosis present

## 2019-06-26 DIAGNOSIS — I130 Hypertensive heart and chronic kidney disease with heart failure and stage 1 through stage 4 chronic kidney disease, or unspecified chronic kidney disease: Secondary | ICD-10-CM | POA: Diagnosis present

## 2019-06-26 DIAGNOSIS — K567 Ileus, unspecified: Secondary | ICD-10-CM | POA: Diagnosis not present

## 2019-06-26 DIAGNOSIS — Z841 Family history of disorders of kidney and ureter: Secondary | ICD-10-CM

## 2019-06-26 DIAGNOSIS — F1010 Alcohol abuse, uncomplicated: Secondary | ICD-10-CM | POA: Diagnosis present

## 2019-06-26 DIAGNOSIS — K219 Gastro-esophageal reflux disease without esophagitis: Secondary | ICD-10-CM | POA: Diagnosis present

## 2019-06-26 DIAGNOSIS — Z681 Body mass index (BMI) 19 or less, adult: Secondary | ICD-10-CM

## 2019-06-26 DIAGNOSIS — J449 Chronic obstructive pulmonary disease, unspecified: Secondary | ICD-10-CM | POA: Diagnosis present

## 2019-06-26 DIAGNOSIS — K5669 Other partial intestinal obstruction: Secondary | ICD-10-CM | POA: Diagnosis not present

## 2019-06-26 DIAGNOSIS — R112 Nausea with vomiting, unspecified: Secondary | ICD-10-CM | POA: Diagnosis not present

## 2019-06-26 DIAGNOSIS — N179 Acute kidney failure, unspecified: Secondary | ICD-10-CM | POA: Diagnosis present

## 2019-06-26 DIAGNOSIS — Z7951 Long term (current) use of inhaled steroids: Secondary | ICD-10-CM

## 2019-06-26 DIAGNOSIS — I10 Essential (primary) hypertension: Secondary | ICD-10-CM | POA: Diagnosis present

## 2019-06-26 DIAGNOSIS — E440 Moderate protein-calorie malnutrition: Secondary | ICD-10-CM | POA: Diagnosis present

## 2019-06-26 DIAGNOSIS — I5042 Chronic combined systolic (congestive) and diastolic (congestive) heart failure: Secondary | ICD-10-CM | POA: Diagnosis present

## 2019-06-26 DIAGNOSIS — Z87891 Personal history of nicotine dependence: Secondary | ICD-10-CM

## 2019-06-26 DIAGNOSIS — E876 Hypokalemia: Secondary | ICD-10-CM | POA: Diagnosis present

## 2019-06-26 DIAGNOSIS — K56609 Unspecified intestinal obstruction, unspecified as to partial versus complete obstruction: Secondary | ICD-10-CM

## 2019-06-26 DIAGNOSIS — K6389 Other specified diseases of intestine: Secondary | ICD-10-CM | POA: Diagnosis present

## 2019-06-26 DIAGNOSIS — E785 Hyperlipidemia, unspecified: Secondary | ICD-10-CM | POA: Diagnosis present

## 2019-06-26 DIAGNOSIS — R1084 Generalized abdominal pain: Secondary | ICD-10-CM | POA: Diagnosis not present

## 2019-06-26 DIAGNOSIS — Z888 Allergy status to other drugs, medicaments and biological substances status: Secondary | ICD-10-CM

## 2019-06-26 DIAGNOSIS — C786 Secondary malignant neoplasm of retroperitoneum and peritoneum: Secondary | ICD-10-CM | POA: Diagnosis present

## 2019-06-26 DIAGNOSIS — C772 Secondary and unspecified malignant neoplasm of intra-abdominal lymph nodes: Secondary | ICD-10-CM | POA: Diagnosis present

## 2019-06-26 DIAGNOSIS — I252 Old myocardial infarction: Secondary | ICD-10-CM

## 2019-06-26 DIAGNOSIS — K648 Other hemorrhoids: Secondary | ICD-10-CM | POA: Diagnosis present

## 2019-06-26 DIAGNOSIS — M19012 Primary osteoarthritis, left shoulder: Secondary | ICD-10-CM | POA: Diagnosis present

## 2019-06-26 DIAGNOSIS — D539 Nutritional anemia, unspecified: Secondary | ICD-10-CM | POA: Diagnosis present

## 2019-06-26 DIAGNOSIS — I428 Other cardiomyopathies: Secondary | ICD-10-CM | POA: Diagnosis present

## 2019-06-26 DIAGNOSIS — Z825 Family history of asthma and other chronic lower respiratory diseases: Secondary | ICD-10-CM

## 2019-06-26 DIAGNOSIS — Z79899 Other long term (current) drug therapy: Secondary | ICD-10-CM

## 2019-06-26 DIAGNOSIS — I5022 Chronic systolic (congestive) heart failure: Secondary | ICD-10-CM

## 2019-06-26 DIAGNOSIS — M160 Bilateral primary osteoarthritis of hip: Secondary | ICD-10-CM | POA: Diagnosis present

## 2019-06-26 DIAGNOSIS — Z20828 Contact with and (suspected) exposure to other viral communicable diseases: Secondary | ICD-10-CM | POA: Diagnosis present

## 2019-06-26 DIAGNOSIS — C184 Malignant neoplasm of transverse colon: Secondary | ICD-10-CM

## 2019-06-26 LAB — ABO/RH: ABO/RH(D): A POS

## 2019-06-26 LAB — COMPREHENSIVE METABOLIC PANEL
ALT: 20 U/L (ref 0–44)
ALT: 21 U/L (ref 0–44)
AST: 16 U/L (ref 15–41)
AST: 18 U/L (ref 15–41)
Albumin: 3.5 g/dL (ref 3.5–5.0)
Albumin: 3.4 g/dL — ABNORMAL LOW (ref 3.5–5.0)
Alkaline Phosphatase: 72 U/L (ref 38–126)
Alkaline Phosphatase: 76 U/L (ref 38–126)
Anion gap: 14 (ref 5–15)
Anion gap: 11 (ref 5–15)
BUN: 31 mg/dL — ABNORMAL HIGH (ref 8–23)
BUN: 26 mg/dL — ABNORMAL HIGH (ref 8–23)
CO2: 22 mmol/L (ref 22–32)
CO2: 26 mmol/L (ref 22–32)
Calcium: 9.1 mg/dL (ref 8.9–10.3)
Calcium: 8.9 mg/dL (ref 8.9–10.3)
Chloride: 105 mmol/L (ref 98–111)
Chloride: 103 mmol/L (ref 98–111)
Creatinine, Ser: 1.58 mg/dL — ABNORMAL HIGH (ref 0.61–1.24)
Creatinine, Ser: 1.25 mg/dL — ABNORMAL HIGH (ref 0.61–1.24)
GFR calc Af Amer: 52 mL/min — ABNORMAL LOW (ref >60)
GFR calc Af Amer: >60 mL/min (ref >60)
GFR calc non Af Amer: 45 mL/min — ABNORMAL LOW (ref >60)
GFR calc non Af Amer: 60 mL/min — ABNORMAL LOW (ref >60)
Glucose, Bld: 126 mg/dL — ABNORMAL HIGH (ref 70–99)
Glucose, Bld: 119 mg/dL — ABNORMAL HIGH (ref 70–99)
Potassium: 3.3 mmol/L — ABNORMAL LOW (ref 3.5–5.1)
Potassium: 3.5 mmol/L (ref 3.5–5.1)
Sodium: 141 mmol/L (ref 135–145)
Sodium: 140 mmol/L (ref 135–145)
Total Bilirubin: 0.3 mg/dL (ref 0.3–1.2)
Total Bilirubin: 0.5 mg/dL (ref 0.3–1.2)
Total Protein: 7.1 g/dL (ref 6.5–8.1)
Total Protein: 6.7 g/dL (ref 6.5–8.1)

## 2019-06-26 LAB — URINALYSIS, ROUTINE W REFLEX MICROSCOPIC
Bilirubin Urine: NEGATIVE
Glucose, UA: NEGATIVE mg/dL
Hgb urine dipstick: NEGATIVE
Ketones, ur: NEGATIVE mg/dL
Leukocytes,Ua: NEGATIVE
Nitrite: NEGATIVE
Protein, ur: NEGATIVE mg/dL
Specific Gravity, Urine: 1.025 (ref 1.005–1.030)
pH: 6 (ref 5.0–8.0)

## 2019-06-26 LAB — CBC
HCT: 36.7 % — ABNORMAL LOW (ref 39.0–52.0)
HCT: 36.5 % — ABNORMAL LOW (ref 39.0–52.0)
Hemoglobin: 11.5 g/dL — ABNORMAL LOW (ref 13.0–17.0)
Hemoglobin: 11.6 g/dL — ABNORMAL LOW (ref 13.0–17.0)
MCH: 24.7 pg — ABNORMAL LOW (ref 26.0–34.0)
MCH: 24.9 pg — ABNORMAL LOW (ref 26.0–34.0)
MCHC: 31.3 g/dL (ref 30.0–36.0)
MCHC: 31.8 g/dL (ref 30.0–36.0)
MCV: 78.8 fL — ABNORMAL LOW (ref 80.0–100.0)
MCV: 78.5 fL — ABNORMAL LOW (ref 80.0–100.0)
Platelets: 538 10*3/uL — ABNORMAL HIGH (ref 150–400)
Platelets: 486 10*3/uL — ABNORMAL HIGH (ref 150–400)
RBC: 4.66 MIL/uL (ref 4.22–5.81)
RBC: 4.65 MIL/uL (ref 4.22–5.81)
RDW: 15.5 % (ref 11.5–15.5)
RDW: 15.4 % (ref 11.5–15.5)
WBC: 16 10*3/uL — ABNORMAL HIGH (ref 4.0–10.5)
WBC: 16.9 10*3/uL — ABNORMAL HIGH (ref 4.0–10.5)
nRBC: 0 % (ref 0.0–0.2)
nRBC: 0 % (ref 0.0–0.2)

## 2019-06-26 LAB — PROTIME-INR
INR: 0.9 (ref 0.8–1.2)
Prothrombin Time: 12.3 seconds (ref 11.4–15.2)

## 2019-06-26 LAB — IRON AND TIBC
Iron: 18 ug/dL — ABNORMAL LOW (ref 45–182)
Saturation Ratios: 5 % — ABNORMAL LOW (ref 17.9–39.5)
TIBC: 367 ug/dL (ref 250–450)
UIBC: 349 ug/dL

## 2019-06-26 LAB — FERRITIN: Ferritin: 19 ng/mL — ABNORMAL LOW (ref 24–336)

## 2019-06-26 LAB — TSH: TSH: 1.823 u[IU]/mL (ref 0.350–4.500)

## 2019-06-26 LAB — LIPASE, BLOOD: Lipase: 24 U/L (ref 11–51)

## 2019-06-26 LAB — TYPE AND SCREEN
ABO/RH(D): A POS
Antibody Screen: NEGATIVE

## 2019-06-26 LAB — SARS CORONAVIRUS 2 (TAT 6-24 HRS): SARS Coronavirus 2: NEGATIVE

## 2019-06-26 MED ORDER — MOMETASONE FURO-FORMOTEROL FUM 200-5 MCG/ACT IN AERO
2.0000 | INHALATION_SPRAY | Freq: Two times a day (BID) | RESPIRATORY_TRACT | Status: DC
  Administered 2019-06-26 – 2019-07-04 (×16): 2 via RESPIRATORY_TRACT
  Filled 2019-06-26: qty 8.8

## 2019-06-26 MED ORDER — POLYETHYLENE GLYCOL 3350 17 GM/SCOOP PO POWD
17.0000 g | Freq: Every day | ORAL | Status: DC | PRN
  Filled 2019-06-26: qty 255

## 2019-06-26 MED ORDER — HYDROMORPHONE HCL 1 MG/ML IJ SOLN
0.5000 mg | INTRAMUSCULAR | Status: AC | PRN
  Administered 2019-06-26 – 2019-06-28 (×2): 0.5 mg via INTRAVENOUS
  Filled 2019-06-26 (×2): qty 1

## 2019-06-26 MED ORDER — LOSARTAN POTASSIUM 25 MG PO TABS
25.0000 mg | ORAL_TABLET | Freq: Every day | ORAL | Status: DC
  Administered 2019-06-26 – 2019-06-28 (×3): 25 mg via ORAL
  Filled 2019-06-26 (×3): qty 1

## 2019-06-26 MED ORDER — ALBUTEROL SULFATE (2.5 MG/3ML) 0.083% IN NEBU: 2.5000 mg | INHALATION_SOLUTION | RESPIRATORY_TRACT | Status: DC | PRN

## 2019-06-26 MED ORDER — AMIODARONE HCL 200 MG PO TABS
100.0000 mg | ORAL_TABLET | Freq: Every day | ORAL | Status: DC
  Administered 2019-06-26 – 2019-07-04 (×9): 100 mg via ORAL
  Filled 2019-06-26 (×9): qty 1

## 2019-06-26 MED ORDER — FLUTICASONE PROPIONATE 50 MCG/ACT NA SUSP
2.0000 | Freq: Every day | NASAL | Status: DC
  Administered 2019-06-26 – 2019-07-04 (×8): 2 via NASAL
  Filled 2019-06-26: qty 16

## 2019-06-26 MED ORDER — ACETAMINOPHEN 650 MG RE SUPP: 650.0000 mg | Freq: Four times a day (QID) | RECTAL | Status: DC | PRN

## 2019-06-26 MED ORDER — MONTELUKAST SODIUM 10 MG PO TABS
10.0000 mg | ORAL_TABLET | Freq: Every day | ORAL | Status: DC
  Administered 2019-06-26 – 2019-07-03 (×8): 10 mg via ORAL
  Filled 2019-06-26 (×8): qty 1

## 2019-06-26 MED ORDER — ENOXAPARIN SODIUM 30 MG/0.3ML ~~LOC~~ SOLN: 30.0000 mg | SUBCUTANEOUS | Status: DC

## 2019-06-26 MED ORDER — PNEUMOCOCCAL VAC POLYVALENT 25 MCG/0.5ML IJ INJ
0.5000 mL | INJECTION | INTRAMUSCULAR | Status: AC | PRN
  Administered 2019-07-04: 0.5 mL via INTRAMUSCULAR
  Filled 2019-06-26: qty 0.5

## 2019-06-26 MED ORDER — POLYETHYLENE GLYCOL 3350 17 G PO PACK: 17.0000 g | PACK | Freq: Every day | ORAL | Status: DC | PRN

## 2019-06-26 MED ORDER — ONDANSETRON HCL 4 MG/2ML IJ SOLN
4.0000 mg | Freq: Once | INTRAMUSCULAR | Status: AC
  Administered 2019-06-26: 4 mg via INTRAVENOUS
  Filled 2019-06-26: qty 2

## 2019-06-26 MED ORDER — HYDROMORPHONE HCL 1 MG/ML IJ SOLN
1.0000 mg | Freq: Once | INTRAMUSCULAR | Status: AC
  Administered 2019-06-26: 02:00:00 1 mg via INTRAVENOUS

## 2019-06-26 MED ORDER — HYDROMORPHONE HCL 1 MG/ML IJ SOLN
INTRAMUSCULAR | Status: AC
  Administered 2019-06-26: 1 mg via INTRAVENOUS
  Filled 2019-06-26: qty 1

## 2019-06-26 MED ORDER — SODIUM CHLORIDE 0.9 % IV SOLN
510.0000 mg | Freq: Once | INTRAVENOUS | Status: AC
  Administered 2019-06-26: 510 mg via INTRAVENOUS
  Filled 2019-06-26: qty 17

## 2019-06-26 MED ORDER — FUROSEMIDE 20 MG PO TABS
20.0000 mg | ORAL_TABLET | Freq: Every day | ORAL | Status: DC
  Administered 2019-06-26 – 2019-07-04 (×9): 20 mg via ORAL
  Filled 2019-06-26 (×9): qty 1

## 2019-06-26 MED ORDER — PEG 3350-KCL-NA BICARB-NACL 420 G PO SOLR
4000.0000 mL | Freq: Once | ORAL | Status: AC
  Administered 2019-06-26: 4000 mL via ORAL
  Filled 2019-06-26: qty 4000

## 2019-06-26 MED ORDER — HYDROMORPHONE HCL 1 MG/ML IJ SOLN
1.0000 mg | Freq: Once | INTRAMUSCULAR | Status: AC
  Administered 2019-06-26: 1 mg via INTRAVENOUS
  Filled 2019-06-26: qty 1

## 2019-06-26 MED ORDER — FERROUS SULFATE 325 (65 FE) MG PO TABS
325.0000 mg | ORAL_TABLET | Freq: Every day | ORAL | Status: DC
  Administered 2019-06-27 – 2019-06-28 (×2): 325 mg via ORAL
  Filled 2019-06-26 (×2): qty 1

## 2019-06-26 MED ORDER — POTASSIUM CHLORIDE IN NACL 20-0.9 MEQ/L-% IV SOLN
INTRAVENOUS | Status: AC
  Administered 2019-06-26: 06:00:00 via INTRAVENOUS
  Filled 2019-06-26: qty 1000

## 2019-06-26 MED ORDER — SODIUM CHLORIDE 0.9 % IV BOLUS (SEPSIS): 1000.0000 mL | Freq: Once | INTRAVENOUS | Status: DC

## 2019-06-26 MED ORDER — SODIUM CHLORIDE 0.9% FLUSH
3.0000 mL | Freq: Once | INTRAVENOUS | Status: AC
  Administered 2019-06-26: 3 mL via INTRAVENOUS

## 2019-06-26 MED ORDER — LORATADINE 10 MG PO TABS
10.0000 mg | ORAL_TABLET | Freq: Every day | ORAL | Status: DC
  Administered 2019-06-26 – 2019-07-04 (×9): 10 mg via ORAL
  Filled 2019-06-26 (×9): qty 1

## 2019-06-26 MED ORDER — ENOXAPARIN SODIUM 40 MG/0.4ML ~~LOC~~ SOLN
40.0000 mg | SUBCUTANEOUS | Status: DC
  Administered 2019-06-26 – 2019-06-28 (×3): 40 mg via SUBCUTANEOUS
  Filled 2019-06-26 (×3): qty 0.4

## 2019-06-26 MED ORDER — AMLODIPINE BESYLATE 5 MG PO TABS
5.0000 mg | ORAL_TABLET | Freq: Every day | ORAL | Status: DC
  Administered 2019-06-26: 5 mg via ORAL
  Filled 2019-06-26 (×2): qty 1

## 2019-06-26 MED ORDER — CARVEDILOL 3.125 MG PO TABS
3.1250 mg | ORAL_TABLET | Freq: Two times a day (BID) | ORAL | Status: DC
  Administered 2019-06-26 – 2019-07-04 (×16): 3.125 mg via ORAL
  Filled 2019-06-26 (×17): qty 1

## 2019-06-26 MED ORDER — SPIRONOLACTONE 25 MG PO TABS
25.0000 mg | ORAL_TABLET | Freq: Every day | ORAL | Status: DC
  Administered 2019-06-26 – 2019-06-28 (×3): 25 mg via ORAL
  Filled 2019-06-26 (×3): qty 1

## 2019-06-26 MED ORDER — ONDANSETRON HCL 4 MG/2ML IJ SOLN
4.0000 mg | Freq: Four times a day (QID) | INTRAMUSCULAR | Status: DC | PRN
  Administered 2019-06-26 – 2019-06-29 (×2): 4 mg via INTRAVENOUS
  Filled 2019-06-26 (×2): qty 2

## 2019-06-26 MED ORDER — IOHEXOL 300 MG/ML  SOLN
100.0000 mL | Freq: Once | INTRAMUSCULAR | Status: AC | PRN
  Administered 2019-06-26: 100 mL via INTRAVENOUS

## 2019-06-26 MED ORDER — ACETAMINOPHEN 325 MG PO TABS
650.0000 mg | ORAL_TABLET | Freq: Four times a day (QID) | ORAL | Status: DC | PRN
  Administered 2019-06-28 (×2): 650 mg via ORAL
  Filled 2019-06-26 (×2): qty 2

## 2019-06-26 MED ORDER — SODIUM CHLORIDE 0.9 % IV BOLUS (SEPSIS)
500.0000 mL | Freq: Once | INTRAVENOUS | Status: AC
  Administered 2019-06-26: 500 mL via INTRAVENOUS

## 2019-06-26 MED ORDER — ISOSORB DINITRATE-HYDRALAZINE 20-37.5 MG PO TABS
0.5000 | ORAL_TABLET | Freq: Three times a day (TID) | ORAL | Status: DC
  Administered 2019-06-26 – 2019-07-04 (×24): 0.5 via ORAL
  Filled 2019-06-26 (×24): qty 1

## 2019-06-26 NOTE — Consult Note (Addendum)
UNASSIGNED PATIENT CROSS COVER [facility] Reason for Consult: Colonic mass in transverse colon on CT scan. Referring Physician: THP.  Joe Garcia is an 68 y.o. male.  HPI: [name] is a 68 year old black male, with multiple medical problems listed below. He presents to the emergency room at [facility] with a history of abdominal pain nausea and vomiting intermittently, with worsening constipation and change in the caliber of the stool, for the last 5 months. Patient claims he has had 3 distinct episodes of abdominal pain nausea and vomiting. He is also lost about 10 pounds in the last 5 to 6 months.  He denies any history of melena hematochezia his appetite is poor there is no history of fever chills rigors shortness of breath or cardiopulmonary problems. He has never had a colonoscopy. He denies a family history of colon cancer or IBD. 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   Past Medical History:  Diagnosis Date  . Alcoholism ([place])   . Arthritis    hips, L shoulder  . Asthma   . CHF (congestive heart failure) ([place])   . Depression   . Family history of anesthesia complication   . Hyperlipidemia   . Hypertension    Past Surgical History:  Procedure Laterality Date  . LEFT HEART CATH AND CORONARY ANGIOGRAPHY N/A 10/04/2018   Procedure: LEFT HEART CATH AND CORONARY ANGIOGRAPHY;  Surgeon: Jettie Booze, MD;  Location: [facility];  Service: Cardiovascular;  Laterality: N/A;  . RIGHT/LEFT HEART CATH AND CORONARY ANGIOGRAPHY N/A 06/12/2017   Procedure: RIGHT/LEFT HEART CATH AND CORONARY ANGIOGRAPHY;  Surgeon: Jolaine Artist, MD;  Location: [facility];  Service: Cardiovascular;  Laterality: N/A;   Family History  Problem Relation Age of Onset  . Heart  disease Father   . Heart disease Daughter        "fluid around heart"   . [place] disease Daughter   . Cancer Daughter        unsure of type  . Asthma Son   . Heart failure Mother   . Kidney failure Mother    Social History:  reports that he quit smoking about 8 years ago. He has a 4.50 pack-year smoking history. He has never used smokeless tobacco. He reports that he does not drink alcohol or use drugs.  Allergies:  Allergies  Allergen Reactions  . Lisinopril Swelling    Facial and tongue swelling 10/2012   Medications: I have reviewed the patient's current medications.  Results for orders placed or performed during the hospital encounter of 06/26/19 (from the past 48 hour(s))  Urinalysis, Routine w reflex microscopic     Status: Abnormal   Collection Time: 06/26/19 12:51 AM  Result Value Ref Range   Color, Urine STRAW (A) YELLOW   APPearance CLEAR CLEAR   Specific Gravity, Urine 1.025 1.005 - 1.030   pH 6.0 5.0 - 8.0   Glucose, UA NEGATIVE NEGATIVE mg/dL   Hgb urine dipstick NEGATIVE NEGATIVE   Bilirubin Urine NEGATIVE NEGATIVE   Ketones, ur NEGATIVE NEGATIVE mg/dL   Protein, ur NEGATIVE NEGATIVE mg/dL   Nitrite NEGATIVE NEGATIVE   Leukocytes,Ua NEGATIVE NEGATIVE    Comment: Performed at [facility] [address] [place], [place] [address]  Lipase, blood  Status: None   Collection Time: 06/26/19 12:55 AM  Result Value Ref Range   Lipase 24 11 - 51 U/L    Comment: Performed at [facility], [place] [address] [place], [place] [address]  Comprehensive metabolic panel     Status: Abnormal   Collection Time: 06/26/19 12:55 AM  Result Value Ref Range   Sodium 141 135 - 145 mmol/L   Potassium 3.3 (L) 3.5 - 5.1 mmol/L   Chloride 105 98 - 111 mmol/L   CO2 22 22 - 32 mmol/L   Glucose, Bld 126 (H) 70 - 99 mg/dL   BUN 31 (H) 8 - 23 mg/dL   Creatinine, Ser 1.58 (H) 0.61 - 1.24 mg/dL   Calcium 9.1 8.9 - 10.3 mg/dL   Total Protein 7.1 6.5 - 8.1 g/dL   Albumin  3.5 3.5 - 5.0 g/dL   AST 16 15 - 41 U/L   ALT 20 0 - 44 U/L   Alkaline Phosphatase 72 38 - 126 U/L   Total Bilirubin 0.3 0.3 - 1.2 mg/dL   GFR calc non Af Amer 45 (L) >60 mL/min   GFR calc Af Amer 52 (L) >60 mL/min   Anion gap 14 5 - 15    Comment: Performed at [facility] [address], [place], [place] [address]  CBC     Status: Abnormal   Collection Time: 06/26/19 12:55 AM  Result Value Ref Range   WBC 16.0 (H) 4.0 - 10.5 K/uL   RBC 4.66 4.22 - 5.81 MIL/uL   Hemoglobin 11.5 (L) 13.0 - 17.0 g/dL   HCT 36.7 (L) 39.0 - 52.0 %   MCV 78.8 (L) 80.0 - 100.0 fL   MCH 24.7 (L) 26.0 - 34.0 pg   MCHC 31.3 30.0 - 36.0 g/dL   RDW 15.5 11.5 - 15.5 %   Platelets 538 (H) 150 - 400 K/uL   nRBC 0.0 0.0 - 0.2 %    Comment: Performed at [facility], [place] [address], [place], [place] [address]  Protime-INR     Status: None   Collection Time: 06/26/19  1:52 AM  Result Value Ref Range   Prothrombin Time 12.3 11.4 - 15.2 seconds   INR 0.9 0.8 - 1.2    Comment: (NOTE) INR goal varies based on device and disease states. Performed at [facility], [place] [address], [place], [place] [address]   Type and screen     Status: None   Collection Time: 06/26/19  2:08 AM  Result Value Ref Range   ABO/RH(D) A POS    Antibody Screen NEG    Sample Expiration      06/29/2019,2359 Performed at [facility], [place] [address] [place], [place] [address]   ABO/Rh     Status: None (Preliminary result)   Collection Time: 06/26/19  2:08 AM  Result Value Ref Range   ABO/RH(D)      A POS Performed at [facility] [address] [place], [place] [address]   Comprehensive metabolic panel     Status: Abnormal   Collection Time: 06/26/19  6:24 AM  Result Value Ref Range   Sodium 140 135 - 145 mmol/L   Potassium 3.5 3.5 - 5.1 mmol/L   Chloride 103 98 - 111 mmol/L   CO2 26 22 - 32 mmol/L   Glucose, Bld 119 (H) 70 - 99 mg/dL   BUN 26 (H) 8 - 23 mg/dL   Creatinine,  Ser 1.25 (H)  0.61 - 1.24 mg/dL   Calcium 8.9 8.9 - 10.3 mg/dL   Total Protein 6.7 6.5 - 8.1 g/dL   Albumin 3.4 (L) 3.5 - 5.0 g/dL   AST 18 15 - 41 U/L   ALT 21 0 - 44 U/L   Alkaline Phosphatase 76 38 - 126 U/L   Total Bilirubin 0.5 0.3 - 1.2 mg/dL   GFR calc non Af Amer 60 (L) >60 mL/min   GFR calc Af Amer >60 >60 mL/min   Anion gap 11 5 - 15    Comment: Performed at [facility] [address], [place], [place] [address]  CBC     Status: Abnormal   Collection Time: 06/26/19  6:24 AM  Result Value Ref Range   WBC 16.9 (H) 4.0 - 10.5 K/uL   RBC 4.65 4.22 - 5.81 MIL/uL   Hemoglobin 11.6 (L) 13.0 - 17.0 g/dL   HCT 36.5 (L) 39.0 - 52.0 %   MCV 78.5 (L) 80.0 - 100.0 fL   MCH 24.9 (L) 26.0 - 34.0 pg   MCHC 31.8 30.0 - 36.0 g/dL   RDW 15.4 11.5 - 15.5 %   Platelets 486 (H) 150 - 400 K/uL   nRBC 0.0 0.0 - 0.2 %    Comment: Performed at [facility] [address], [place], [place] [address]  Ferritin     Status: Abnormal   Collection Time: 06/26/19  6:24 AM  Result Value Ref Range   Ferritin 19 (L) 24 - 336 ng/mL    Comment: Performed at [facility], [place] [address], [place], [place] [address]  Iron and TIBC     Status: Abnormal   Collection Time: 06/26/19  6:24 AM  Result Value Ref Range   Iron 18 (L) 45 - 182 ug/dL   TIBC 367 250 - 450 ug/dL   Saturation Ratios 5 (L) 17.9 - 39.5 %   UIBC 349 ug/dL    Comment: Performed at [facility], [place] [address] [place], [place] [address]    Ct Abdomen Pelvis W Contrast  Result Date: 06/26/2019 CLINICAL DATA:  Generalized abdominal pain EXAM: CT ABDOMEN AND PELVIS WITH CONTRAST TECHNIQUE: Multidetector CT imaging of the abdomen and pelvis was performed using the standard protocol following bolus administration of intravenous contrast. CONTRAST:  129mL OMNIPAQUE IOHEXOL 300 MG/ML  SOLN COMPARISON:  10/03/2018 FINDINGS: Lower chest: Lung bases are clear. No effusions. Heart is normal size. Hepatobiliary: No focal hepatic  abnormality. Gallbladder unremarkable. Pancreas: No focal abnormality or ductal dilatation. Spleen: No focal abnormality.  Normal size. Adrenals/Urinary Tract: No renal or adrenal mass. Right kidney is severely atrophic and calcified. No hydronephrosis. Urinary bladder unremarkable. Stomach/Bowel: There is an abnormal segment of colon in the distal transverse colon with marked wall thickening. Colon proximal to this is dilated. 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 Small bowel is decompressed. Stomach is unremarkable. Vascular/Lymphatic: No evidence of aneurysm or adenopathy. Scattered calcifications. Reproductive: No visible focal abnormality. Other: No free fluid or free air. Musculoskeletal: Degenerative changes in the hips. No acute bony abnormality. IMPRESSION: Circumferential area of short segment wall thickening within the distal transverse colon extending for approximately 6 cm with dilated, distended colon proximal to this level. 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 This appears partially obstructive. Aortic atherosclerosis. Severely atrophic, calcified right kidney. Electronically Signed   By: Rolm Baptise M.D.   On: 06/26/2019 03:18   Dg  Chest Port 1 View  Result Date: 06/26/2019 CLINICAL DATA:  Nausea and vomiting. EXAM: PORTABLE CHEST 1 VIEW COMPARISON:  06/20/2019 FINDINGS: The cardiomediastinal silhouette is unchanged with normal heart size. No airspace consolidation, edema, pleural effusion, pneumothorax is identified. Aortic atherosclerosis and thoracic spondylosis are noted. IMPRESSION: No active disease. Electronically Signed   By: Logan Bores M.D.   On: 06/26/2019 06:58    Review of Systems  Constitutional: Positive for malaise/fatigue and weight loss. Negative for chills, diaphoresis and fever.  HENT: Negative.   Eyes: Negative.   Respiratory: Negative.   Cardiovascular: Negative.   Gastrointestinal: Positive for abdominal pain,  constipation, nausea and vomiting. Negative for blood in stool and melena.  Genitourinary: Negative.   Skin: Negative.   Neurological: Negative.   Endo/Heme/Allergies: Negative.   Psychiatric/Behavioral: Positive for depression, substance abuse and suicidal ideas. Negative for memory loss. The patient is nervous/anxious. The patient does not have insomnia.    Blood pressure (!) 185/95, pulse 69, temperature 98.7 F (37.1 C), resp. rate 18, height 5\' 11"  (1.803 m), weight 64.9 kg, SpO2 100 %. Physical Exam  Constitutional: He is oriented to person, place, and time. He appears well-developed and well-nourished.  HENT:  Head: Normocephalic and atraumatic.  Eyes: Pupils are equal, round, and reactive to light. Conjunctivae and EOM are normal.  Neck: Normal range of motion. Neck supple.  Cardiovascular: Normal rate and regular rhythm.  Respiratory: Effort normal and breath sounds normal.  GI: Soft. Bowel sounds are normal. He exhibits distension. He exhibits no mass. There is abdominal tenderness. There is no rebound and no guarding.  Musculoskeletal: Normal range of motion.  Neurological: He is alert and oriented to person, place, and time.  Skin: Skin is warm and dry.  Psychiatric: He has a normal mood and affect. His behavior is normal. Judgment and thought content normal.   Assessment/Plan: 1) Transverse colon mass on CT scan with iron deficiency anemia-Ferritin of 19 and a 10 pound weight loss over the last 5 to 6 months/change in bowel habits/change in the caliber of the stool , concerning for malignancy-will plan to do a colonoscopy tomorrow. 2) Alcohol abuse. 3) CHF/HTN/Hyperlipidemia 4) Asthma. 5) Arthitis. 6) Depression/history of suicidal ideation Juanita Craver 06/26/2019, 7:51 AM

## 2019-06-26 NOTE — Progress Notes (Signed)
PROGRESS NOTE  Joe Garcia [id number] DOB: 31-Aug-1950 DOA: 06/26/2019 PCP: Medicine, [facility]  HPI/Recap of past 24 hours: HPI from Dr Annamaria Boots  is a 68 y.o. male, w hypertension, hyperlipidemia, CHF, ETOH dependence, depression, presents with c/o nausea/vomitting/abdominal pain ongoing intermittently for the past 5 months, but currently worsening over the past week.  Patient also endorses weight loss, constipation.  Denies any bright red blood per rectum.  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  Patient admitted for further management    Today, patient denies any new complaints, still reports abdominal pain, nausea/vomiting has improved some with medications.  Denies any chest pain, worsening shortness of breath, fever/chills.  Assessment/Plan: Principal Problem:   Nausea and vomiting Active Problems:   Hypokalemia   ARF (acute renal failure) ([facility])   Nausea & vomiting   Colonic mass  Likely colon mass/CA CT abdomen/pelvis showed circumferential area of short segment wall thickening within the distal transverse colon, concerning for annular focal malignancy GI consulted, plan for colonoscopy on 06/27/2019 General surgery consulted may need partial colectomy Gentle hydration, CLD, bowel prep as per GI, pain management  Iron deficiency anemia Likely due to above Anemia panel showed iron 18, sats 5, ferritin 19 Gave 1 dose of Feraheme on 06/26/2019 Daily p.o. 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   Weight as of this encounter: 64.9 kg.     Code Status: Full  Family Communication: None at bedside  Disposition Plan: To be determined   Consultants:  GI  General surgery  Procedures:  None  Antimicrobials:  None  DVT prophylaxis: Lovenox   Objective: Vitals:   06/26/19 0530 06/26/19 0545 06/26/19 0609 06/26/19 0957  BP: (!) 160/85 (!) 185/144 (!) 185/95 (!) 110/57  Pulse: 66 (!) 46 69 (!) 57  Resp: 17 17 18    Temp:   98.7 F (37.1 C)   TempSrc:      SpO2: 96% 100% 100%   Weight:      Height:        Intake/Output Summary (Last 24 hours) at 06/26/2019 1433 Last data filed at 06/26/2019 1041 Gross per 24 hour  Intake 0 ml  Output 450 ml  Net -450 ml   Filed Weights   06/26/19 0049  Weight: 64.9 kg    Exam:  General: NAD   Cardiovascular: S1, S2 present  Respiratory: CTAB  Abdomen: Soft, generalized tenderness, distended, bowel sounds present  Musculoskeletal: No bilateral pedal edema noted  Skin: Normal  Psychiatry: Normal mood   Data Reviewed: CBC: Recent Labs  Lab 06/26/19 0055 06/26/19 0624  WBC 16.0* 16.9*  HGB 11.5* 11.6*  HCT 36.7* 36.5*  MCV 78.8* 78.5*  PLT 538* [id number]*   Basic Metabolic Panel: Recent Labs  Lab 06/26/19 0055 06/26/19 0624  NA 141 140  K  3.3* 3.5  CL 105 103  CO2 22 26  GLUCOSE 126* 119*  BUN 31* 26*  CREATININE 1.58* 1.25*  CALCIUM 9.1 8.9   GFR: Estimated Creatinine Clearance: 53.4 mL/min (A) (by C-G formula based on SCr of 1.25 mg/dL (H)). Liver Function Tests: Recent Labs  Lab 06/26/19 0055 06/26/19 0624  AST 16 18  ALT 20 21  ALKPHOS 72 76  BILITOT 0.3 0.5  PROT 7.1 6.7  ALBUMIN 3.5 3.4*   Recent Labs  Lab  06/26/19 0055  LIPASE 24   No results for input(s): AMMONIA in the last 168 hours. Coagulation Profile: Recent Labs  Lab 06/26/19 0152  INR 0.9   Cardiac Enzymes: No results for input(s): CKTOTAL, CKMB, CKMBINDEX, TROPONINI in the last 168 hours. BNP (last 3 results) No results for input(s): PROBNP in the last 8760 hours. HbA1C: No results for input(s): HGBA1C in the last 72 hours. CBG: No results for input(s): GLUCAP in the last 168 hours. Lipid Profile: No results for input(s): CHOL, HDL, LDLCALC, TRIG, CHOLHDL, LDLDIRECT in the last 72 hours. Thyroid Function Tests: Recent Labs    06/26/19 0624  TSH 1.823   Anemia Panel: Recent Labs    06/26/19 0624  FERRITIN 19*  TIBC 367  IRON 18*   Urine analysis:    Component Value Date/Time   COLORURINE STRAW (A) 06/26/2019 0051   APPEARANCEUR CLEAR 06/26/2019 0051   LABSPEC 1.025 06/26/2019 0051   PHURINE 6.0 06/26/2019 [place] 06/26/2019 0051   HGBUR NEGATIVE 06/26/2019 0051   BILIRUBINUR NEGATIVE 06/26/2019 0051   KETONESUR NEGATIVE 06/26/2019 0051   PROTEINUR NEGATIVE 06/26/2019 0051   UROBILINOGEN 1.0 07/16/2013 1404   NITRITE NEGATIVE 06/26/2019 0051   LEUKOCYTESUR NEGATIVE 06/26/2019 0051   Sepsis Labs: @LABRCNTIP (procalcitonin:4,lacticidven:4)  )No results found for this or any previous visit (from the past 240 hour(s)).    Studies: Ct Abdomen Pelvis W Contrast  Result Date: 06/26/2019 CLINICAL DATA:  Generalized abdominal pain EXAM: CT ABDOMEN AND PELVIS WITH CONTRAST TECHNIQUE: Multidetector CT imaging of the abdomen and pelvis was performed using the standard protocol following bolus administration of intravenous contrast. CONTRAST:  142mL OMNIPAQUE IOHEXOL 300 MG/ML  SOLN COMPARISON:  10/03/2018 FINDINGS: Lower chest: Lung bases are clear. No effusions. Heart is normal size. Hepatobiliary: No focal hepatic abnormality. Gallbladder unremarkable. Pancreas: No focal abnormality or ductal  dilatation. Spleen: No focal abnormality.  Normal size. Adrenals/Urinary Tract: No renal or adrenal mass. Right kidney is severely atrophic and calcified. No hydronephrosis. Urinary bladder unremarkable. Stomach/Bowel: There is an abnormal segment of colon in the distal transverse colon with marked wall thickening. Colon proximal to this is dilated. 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 Small bowel is decompressed. Stomach is unremarkable. Vascular/Lymphatic: No evidence of aneurysm or adenopathy. Scattered calcifications. Reproductive: No visible focal abnormality. Other: No free fluid or free air. Musculoskeletal: Degenerative changes in the hips. No acute bony abnormality. IMPRESSION: Circumferential area of short segment wall thickening within the distal transverse colon extending for approximately 6 cm with dilated, distended colon proximal to this level. 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 This appears partially obstructive. Aortic atherosclerosis. Severely atrophic, calcified right kidney. Electronically Signed   By: Rolm Baptise M.D.   On: 06/26/2019 03:18   Dg Chest Port 1 View  Result Date: 06/26/2019 CLINICAL DATA:  Nausea and vomiting. EXAM: PORTABLE CHEST 1 VIEW COMPARISON:  06/20/2019 FINDINGS: The cardiomediastinal silhouette is unchanged with normal heart size. No airspace  consolidation, edema, pleural effusion, pneumothorax is identified. Aortic atherosclerosis and thoracic spondylosis are noted. IMPRESSION: No active disease. Electronically Signed   By: Logan Bores M.D.   On: 06/26/2019 06:58    Scheduled Meds: . amiodarone  100 mg Oral Daily  . amLODipine  5 mg Oral Daily  . carvedilol  3.125 mg Oral BID WC  . enoxaparin (LOVENOX) injection  40 mg Subcutaneous Q24H  . fluticasone  2 spray Each Nare Daily  . furosemide  20 mg Oral Daily  . isosorbide-hydrALAZINE  0.5 tablet Oral TID  . loratadine  10 mg Oral Daily  . losartan  25 mg  Oral Daily  . mometasone-formoterol  2 puff Inhalation BID  . montelukast  10 mg Oral QHS  . polyethylene glycol-electrolytes  4,000 mL Oral Once  . spironolactone  25 mg Oral Daily    Continuous Infusions: . 0.9 % NaCl with KCl 20 mEq / L Stopped (06/26/19 1417)     LOS: 0 days     Alma Friendly, MD [facility]  If 7PM-7AM, please contact night-coverage [email or web address] 06/26/2019, 2:33 PM

## 2019-06-26 NOTE — H&P (Signed)
TRH H&P    Patient Demographics:    Joe Garcia, is a 68 y.o. male  MRN: [id number]  DOB - 1951-01-03  Admit Date - 06/26/2019  Referring MD/NP/PA:  Nicola Girt  Outpatient Primary MD for the patient is [facility]  Patient coming from:  home  Chief complaint-  n/v   HPI:    Joe Garcia  is a 68 y.o. male, w hypertension, hyperlipidemia, CHF, ETOH dependence, Depression, presents with c/o nausea and vomitting, and  abdominal pain.  Pt states that has had a few similar episodes since 52months ago. Pt states that the n/v and pain were worse tonight and not improving and thus presented to ED.  Pt notes weight loss.  Pt denies fever, chills, cough, cp, palp, sob, diarrhea, brbpr.  Forgot to ask about caliber of stool apologies.   In ED,  T 98.7, P 94 R 19, Bp 108/74  Pox 100% on RA WT 64.9kg   CT abd/ pelvis IMPRESSION: Circumferential area of short segment wall thickening within the distal transverse colon extending for approximately 6 cm with dilated, distended colon proximal to this level. 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  This appears partially obstructive.  Aortic atherosclerosis.  Severely atrophic, calcified right kidney.  Na 141, K 3.3, Bun 31, Creatinine 1.58 Ast 16, Alt 20 Wbc 16.0, Hgb 11.5, Plt 538 INR 0.9 Urinalysis pending  Type and screen completed  12 lead ekg pending CXR pending  Pt will be admitted for apple core lesion of the colon and n/v.   Review of systems:    In addition to the HPI above,  No Fever-chills, No Headache, No changes with Vision or hearing, No problems swallowing food or Liquids, No Chest pain, Cough or Shortness of Breath,  No Blood in stool or Urine, No dysuria, No new skin rashes or bruises, No new joints pains-aches,  No new weakness, tingling, numbness in any extremity,   No  polyuria, polydypsia or polyphagia, No significant Mental Stressors.  All other systems reviewed and are negative.    Past History of the following :    Past Medical History:  Diagnosis Date  . Alcoholism ([place])   . Arthritis    hips, L shoulder  . Asthma   . CHF (congestive heart failure) ([place])   . Depression   . Family history of anesthesia complication   . Hyperlipidemia   . Hypertension       Past Surgical History:  Procedure Laterality Date  . LEFT HEART CATH AND CORONARY ANGIOGRAPHY N/A 10/04/2018   Procedure: LEFT HEART CATH AND CORONARY ANGIOGRAPHY;  Surgeon: Jettie Booze, MD;  Location: [facility];  Service: Cardiovascular;  Laterality: N/A;  . RIGHT/LEFT HEART CATH AND CORONARY ANGIOGRAPHY N/A 06/12/2017   Procedure: RIGHT/LEFT HEART CATH AND CORONARY ANGIOGRAPHY;  Surgeon: Jolaine Artist, MD;  Location: [facility];  Service: Cardiovascular;  Laterality: N/A;      Social History:      Social History   Tobacco Use  .  Smoking status: Former Smoker    Packs/day: 0.10    Years: 45.00    Pack years: 4.50    Quit date: 2012    Years since quitting: 8.9  . Smokeless tobacco: Never Used  Substance Use Topics  . Alcohol use: No    Frequency: Never    Comment: Pt states no etoh since April 2014       Family History :     Family History  Problem Relation Age of Onset  . Heart disease Father   . Heart disease Daughter        "fluid around heart"   . [place] disease Daughter   . Cancer Daughter        unsure of type  . Asthma Son   . Heart failure Mother   . Kidney failure Mother      Home Medications:   Prior to Admission medications   Medication Sig Start Date End Date Taking? Authorizing Provider  acetaminophen (TYLENOL) 500 MG tablet Take 2 tablets (1,000 mg total) by mouth 3 (three) times daily as needed for headache (pain). 07/28/18  Yes Dhungel, Nishant, MD  albuterol (PROVENTIL) (2.5 MG/3ML) 0.083% nebulizer  solution Take 3 mLs (2.5 mg total) by nebulization every 6 (six) hours as needed for wheezing or shortness of breath. 06/20/19  Yes Bast, Traci A, NP  albuterol (VENTOLIN HFA) 108 (90 Base) MCG/ACT inhaler Inhale 1-2 puffs into the lungs every 4 (four) hours as needed for wheezing or shortness of breath. 01/29/19  Yes Hall-Potvin, [place], PA-C  amiodarone (PACERONE) 100 MG tablet Take 1 tablet (100 mg total) by mouth daily. 06/21/19  Yes Larey Dresser, MD  amLODipine (NORVASC) 5 MG tablet Take 5 mg by mouth daily.   Yes [provider]  budesonide-formoterol (SYMBICORT) 160-4.5 MCG/ACT inhaler Inhale 2 puffs into the lungs 2 (two) times daily. 11/23/18  Yes Tanda Rockers, MD  carvedilol (COREG) 3.125 MG tablet Take 1 tablet (3.125 mg total) by mouth 2 (two) times daily with a meal. 04/15/19  Yes Bensimhon, Shaune Pascal, MD  fluticasone (FLONASE) 50 MCG/ACT nasal spray Place 2 sprays into both nostrils daily. 02/23/18  Yes Melynda Ripple, MD  furosemide (LASIX) 20 MG tablet Take 1 tablet (20 mg total) by mouth daily. 12/09/18  Yes Clegg, Amy D, NP  isosorbide-hydrALAZINE (BIDIL) 20-37.5 MG tablet Take 0.5 tablets by mouth 3 (three) times daily.    Yes [provider]  loratadine (CLARITIN) 10 MG tablet Take 10 mg by mouth daily.   Yes [provider]  losartan (COZAAR) 25 MG tablet TAKE 1 TABLET BY MOUTH EVERY DAY Patient taking differently: Take 25 mg by mouth daily.  05/30/19  Yes Bensimhon, Shaune Pascal, MD  montelukast (SINGULAIR) 10 MG tablet Take 10 mg by mouth at bedtime.   Yes [provider]  polyethylene glycol powder (GLYCOLAX/MIRALAX) powder Take 17 g by mouth daily as needed for mild constipation.   Yes [provider]  spironolactone (ALDACTONE) 25 MG tablet Take 1 tablet (25 mg total) by mouth daily. 06/01/19 08/30/19 Yes Clegg, Amy D, NP     Allergies:     Allergies  Allergen Reactions  . Lisinopril Swelling    Facial and tongue swelling  10/2012     Physical Exam:   Vitals  Blood pressure (!) 154/101, pulse 81, temperature 98.7 F (37.1 C), temperature source Oral, resp. rate (!) 22, height 5\' 11"  (1.803 m), weight 64.9 kg, SpO2 100 %.  1.  General: axoxo3  2. Psychiatric: euthymic  3. Neurologic: nonfocal  4. HEENMT:  Anicteric, pupils 1.32mm symmetric, direct, consensual, intact Neck: no jvd, no cervical no supraclavicular adenopathy  5. Respiratory : CTAB  6. Cardiovascular : rrr s1, s2, no m/g/r  7. Gastrointestinal:  Abd: soft, nt, nd, +bs  8. Skin:  Ext: no c/c/e, no rash  9.Musculoskeletal:  Good ROM    Data Review:    CBC Recent Labs  Lab 06/26/19 0055  WBC 16.0*  HGB 11.5*  HCT 36.7*  PLT 538*  MCV 78.8*  MCH 24.7*  MCHC 31.3  RDW 15.5   ------------------------------------------------------------------------------------------------------------------  Results for orders placed or performed during the hospital encounter of 06/26/19 (from the past 48 hour(s))  Lipase, blood     Status: None   Collection Time: 06/26/19 12:55 AM  Result Value Ref Range   Lipase 24 11 - 51 U/L    Comment: Performed at [facility], [place] [address] [place], [place] [address]  Comprehensive metabolic panel     Status: Abnormal   Collection Time: 06/26/19 12:55 AM  Result Value Ref Range   Sodium 141 135 - 145 mmol/L   Potassium 3.3 (L) 3.5 - 5.1 mmol/L   Chloride 105 98 - 111 mmol/L   CO2 22 22 - 32 mmol/L   Glucose, Bld 126 (H) 70 - 99 mg/dL   BUN 31 (H) 8 - 23 mg/dL   Creatinine, Ser 1.58 (H) 0.61 - 1.24 mg/dL   Calcium 9.1 8.9 - 10.3 mg/dL   Total Protein 7.1 6.5 - 8.1 g/dL   Albumin 3.5 3.5 - 5.0 g/dL   AST 16 15 - 41 U/L   ALT 20 0 - 44 U/L   Alkaline Phosphatase 72 38 - 126 U/L   Total Bilirubin 0.3 0.3 - 1.2 mg/dL   GFR calc non Af Amer 45 (L) >60 mL/min   GFR calc Af Amer 52 (L) >60 mL/min   Anion gap 14 5 - 15    Comment: Performed at [facility] [address], [place], [place] [address]  CBC     Status: Abnormal   Collection Time: 06/26/19 12:55 AM  Result Value Ref Range   WBC 16.0 (H) 4.0 - 10.5 K/uL   RBC 4.66 4.22 - 5.81 MIL/uL   Hemoglobin 11.5 (L) 13.0 - 17.0 g/dL   HCT 36.7 (L) 39.0 - 52.0 %   MCV 78.8 (L) 80.0 - 100.0 fL   MCH 24.7 (L) 26.0 - 34.0 pg   MCHC 31.3 30.0 - 36.0 g/dL   RDW 15.5 11.5 - 15.5 %   Platelets 538 (H) 150 - 400 K/uL   nRBC 0.0 0.0 - 0.2 %    Comment: Performed at [facility], [place] [address] [place], [place] [address]  Protime-INR     Status: None   Collection Time: 06/26/19  1:52 AM  Result Value Ref Range   Prothrombin Time 12.3 11.4 - 15.2 seconds   INR 0.9 0.8 - 1.2    Comment: (NOTE) INR goal varies based on device and disease states. Performed at [facility], [place] [address], [place], [place] [address]   Type and screen     Status: None   Collection Time: 06/26/19  2:08 AM  Result Value Ref Range   ABO/RH(D) A POS    Antibody Screen NEG    Sample Expiration      06/29/2019,2359 Performed at [facility]  Lab, [address], [place], [place] [address]   ABO/Rh     Status: None (Preliminary result)   Collection Time: 06/26/19  2:08 AM  Result Value Ref Range   ABO/RH(D)      A POS Performed at [facility] [address] [place], [place] [address]     Chemistries  Recent Labs  Lab 06/26/19 0055  NA 141  K 3.3*  CL 105  CO2 22  GLUCOSE 126*  BUN 31*  CREATININE 1.58*  CALCIUM 9.1  AST 16  ALT 20  ALKPHOS 72  BILITOT 0.3   ------------------------------------------------------------------------------------------------------------------  ------------------------------------------------------------------------------------------------------------------ GFR: Estimated Creatinine Clearance: 42.2 mL/min (A) (by C-G formula based on SCr of 1.58 mg/dL (H)). Liver Function Tests: Recent Labs  Lab 06/26/19 0055  AST 16  ALT 20  ALKPHOS 72  BILITOT 0.3  PROT  7.1  ALBUMIN 3.5   Recent Labs  Lab 06/26/19 0055  LIPASE 24   No results for input(s): AMMONIA in the last 168 hours. Coagulation Profile: Recent Labs  Lab 06/26/19 0152  INR 0.9   Cardiac Enzymes: No results for input(s): CKTOTAL, CKMB, CKMBINDEX, TROPONINI in the last 168 hours. BNP (last 3 results) No results for input(s): PROBNP in the last 8760 hours. HbA1C: No results for input(s): HGBA1C in the last 72 hours. CBG: No results for input(s): GLUCAP in the last 168 hours. Lipid Profile: No results for input(s): CHOL, HDL, LDLCALC, TRIG, CHOLHDL, LDLDIRECT in the last 72 hours. Thyroid Function Tests: No results for input(s): TSH, T4TOTAL, FREET4, T3FREE, THYROIDAB in the last 72 hours. Anemia Panel: No results for input(s): VITAMINB12, FOLATE, FERRITIN, TIBC, IRON, RETICCTPCT in the last 72 hours.  --------------------------------------------------------------------------------------------------------------- Urine analysis:    Component Value Date/Time   COLORURINE STRAW (A) 08/30/2017 1729   APPEARANCEUR CLEAR 08/30/2017 1729   LABSPEC 1.008 08/30/2017 1729   PHURINE 6.0 08/30/2017 1729   GLUCOSEU NEGATIVE 08/30/2017 1729   HGBUR NEGATIVE 08/30/2017 1729   BILIRUBINUR NEGATIVE 08/30/2017 1729   KETONESUR NEGATIVE 08/30/2017 1729   PROTEINUR NEGATIVE 08/30/2017 1729   UROBILINOGEN 1.0 07/16/2013 1404   NITRITE NEGATIVE 08/30/2017 1729   LEUKOCYTESUR NEGATIVE 08/30/2017 1729      Imaging Results:    Ct Abdomen Pelvis W Contrast  Result Date: 06/26/2019 CLINICAL DATA:  Generalized abdominal pain EXAM: CT ABDOMEN AND PELVIS WITH CONTRAST TECHNIQUE: Multidetector CT imaging of the abdomen and pelvis was performed using the standard protocol following bolus administration of intravenous contrast. CONTRAST:  136mL OMNIPAQUE IOHEXOL 300 MG/ML  SOLN COMPARISON:  10/03/2018 FINDINGS: Lower chest: Lung bases are clear. No effusions. Heart is normal size. Hepatobiliary:  No focal hepatic abnormality. Gallbladder unremarkable. Pancreas: No focal abnormality or ductal dilatation. Spleen: No focal abnormality.  Normal size. Adrenals/Urinary Tract: No renal or adrenal mass. Right kidney is severely atrophic and calcified. No hydronephrosis. Urinary bladder unremarkable. Stomach/Bowel: There is an abnormal segment of colon in the distal transverse colon with marked wall thickening. Colon proximal to this is dilated. 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 Small bowel is decompressed. Stomach is unremarkable. Vascular/Lymphatic: No evidence of aneurysm or adenopathy. Scattered calcifications. Reproductive: No visible focal abnormality. Other: No free fluid or free air. Musculoskeletal: Degenerative changes in the hips. No acute bony abnormality. IMPRESSION: Circumferential area of short segment wall thickening within the distal transverse colon extending for approximately 6 cm with dilated, distended colon proximal to this level. 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 This appears partially  obstructive. Aortic atherosclerosis. Severely atrophic, calcified right kidney. Electronically Signed   By: Rolm Baptise M.D.   On: 06/26/2019 03:18       Assessment & Plan:    Principal Problem:   Nausea and vomiting Active Problems:   Hypokalemia   ARF (acute renal failure) (HCC)  N/v Zofran 4mg  iv q6h prn  Cxr, please follow up on result  Apple core lesion of the colon NPO covid-19 pcr pending, please follow up  Please consult GI in am Please consult surgery in am  Hypokalemia Replete Check bmp in am  ARF vs CKD stage3 Hydrate with ns iv gently Check bmp in am  CHF (EF 30-35%), mild MR Cont Lasix 20mg  po qday Cont Spironolactone 25mg  po qday Cont Bidil 20/37.5mg  1/2 po tid Cont Carvedilol 3.125mg  po bid Cont Losartan 25mg  po qday  NICM, h/o PVC Cont Amiodarone 100mg  po qday  Hypertension Cont Amlodipine 5mg  po qday  Cont Lasix as above Cont Spironolactone as above Cont Bidil as above Cont Carvedilol as above Cont Losartan as above  Asthma Cont Singulair 10mg  po qday Cont Symbicort-> Dulera 2puff bid Cont Albuterol HFA 2puff q4h prn    DVT Prophylaxis-   Lovenox - SCDs   AM Labs Ordered, also please review Full Orders  Family Communication: Admission, patients condition and plan of care including tests being ordered have been discussed with the patient  who indicate understanding and agree with the plan and Code Status.  Code Status:  FULL CODE per patient, left message for daughter that patient admitted to [facility]  Admission status:  Inpatient: Based on patients clinical presentation and evaluation of above clinical data, I have made determination that patient meets Inpatient criteria at this time.  Pt will require colonscopy / flex sig and then surgery for colonic lesion  Time spent in minutes :  70 minutes   Jani Gravel M.D on 06/26/2019 at 4:06 AM

## 2019-06-26 NOTE — Consult Note (Signed)
Reason for Consult:partially obstructing colon mass Referring Physician: Cliff Rothfeld is an 68 y.o. male.  HPI:  Pt is a 68 yo M who presented to the ED with n/v/abdominal pain.  He had an episode around 5 months ago and then one 2 weeks ago.  This time was much worse.  He has been having progressive constipation and decreased caliber stool.  He has also had a significant weight loss.  He has never had a colonoscopy.  He doesn't really know much of his family history, but is unaware of any cancers.  He denies bloody or tarry bowel movements.  He has progressively been feeling weaker.   He has a history of CHF, EtOH abuse, depression, and HTN.    Past Medical History:  Diagnosis Date  . Alcoholism ([place])   . Arthritis    hips, L shoulder  . Asthma   . CHF (congestive heart failure) ([place])   . Depression   . Family history of anesthesia complication   . Hyperlipidemia   . Hypertension     Past Surgical History:  Procedure Laterality Date  . LEFT HEART CATH AND CORONARY ANGIOGRAPHY N/A 10/04/2018   Procedure: LEFT HEART CATH AND CORONARY ANGIOGRAPHY;  Surgeon: Jettie Booze, MD;  Location: [facility];  Service: Cardiovascular;  Laterality: N/A;  . RIGHT/LEFT HEART CATH AND CORONARY ANGIOGRAPHY N/A 06/12/2017   Procedure: RIGHT/LEFT HEART CATH AND CORONARY ANGIOGRAPHY;  Surgeon: Jolaine Artist, MD;  Location: [facility];  Service: Cardiovascular;  Laterality: N/A;    Family History  Problem Relation Age of Onset  . Heart disease Father   . Heart disease Daughter        "fluid around heart"   . [place] disease Daughter   . Cancer Daughter        unsure of type  . Asthma Son   . Heart failure Mother   . Kidney failure Mother     Social History:  reports that he quit smoking about 8 years ago. He has a 4.50 pack-year smoking history. He has never used smokeless tobacco. He reports that he does not drink alcohol or use drugs.  Allergies:   Allergies  Allergen Reactions  . Lisinopril Swelling    Facial and tongue swelling 10/2012    Medications:  Current Meds  Medication Sig  . acetaminophen (TYLENOL) 500 MG tablet Take 2 tablets (1,000 mg total) by mouth 3 (three) times daily as needed for headache (pain).  Marland Kitchen albuterol (PROVENTIL) (2.5 MG/3ML) 0.083% nebulizer solution Take 3 mLs (2.5 mg total) by nebulization every 6 (six) hours as needed for wheezing or shortness of breath.  Marland Kitchen albuterol (VENTOLIN HFA) 108 (90 Base) MCG/ACT inhaler Inhale 1-2 puffs into the lungs every 4 (four) hours as needed for wheezing or shortness of breath.  Marland Kitchen amiodarone (PACERONE) 100 MG tablet Take 1 tablet (100 mg total) by mouth daily.  Marland Kitchen amLODipine (NORVASC) 5 MG tablet Take 5 mg by mouth daily.  . budesonide-formoterol (SYMBICORT) 160-4.5 MCG/ACT inhaler Inhale 2 puffs into the lungs 2 (two) times daily.  . carvedilol (COREG) 3.125 MG tablet Take 1 tablet (3.125 mg total) by mouth 2 (two) times daily with a meal.  . fluticasone (FLONASE) 50 MCG/ACT nasal spray Place 2 sprays into both nostrils daily.  . furosemide (LASIX) 20 MG tablet Take 1 tablet (20 mg total) by mouth daily.  . isosorbide-hydrALAZINE (BIDIL) 20-37.5 MG tablet Take 0.5 tablets by mouth 3 (three)  times daily.   Marland Kitchen loratadine (CLARITIN) 10 MG tablet Take 10 mg by mouth daily.  Marland Kitchen losartan (COZAAR) 25 MG tablet TAKE 1 TABLET BY MOUTH EVERY DAY (Patient taking differently: Take 25 mg by mouth daily. )  . montelukast (SINGULAIR) 10 MG tablet Take 10 mg by mouth at bedtime.  . polyethylene glycol powder (GLYCOLAX/MIRALAX) powder Take 17 g by mouth daily as needed for mild constipation.  Marland Kitchen spironolactone (ALDACTONE) 25 MG tablet Take 1 tablet (25 mg total) by mouth daily.     Results for orders placed or performed during the hospital encounter of 06/26/19 (from the past 48 hour(s))  Urinalysis, Routine w reflex microscopic     Status: Abnormal   Collection Time: 06/26/19 12:51 AM   Result Value Ref Range   Color, Urine STRAW (A) YELLOW   APPearance CLEAR CLEAR   Specific Gravity, Urine 1.025 1.005 - 1.030   pH 6.0 5.0 - 8.0   Glucose, UA NEGATIVE NEGATIVE mg/dL   Hgb urine dipstick NEGATIVE NEGATIVE   Bilirubin Urine NEGATIVE NEGATIVE   Ketones, ur NEGATIVE NEGATIVE mg/dL   Protein, ur NEGATIVE NEGATIVE mg/dL   Nitrite NEGATIVE NEGATIVE   Leukocytes,Ua NEGATIVE NEGATIVE    Comment: Performed at [facility] [address] [place], [place] [address]  Lipase, blood     Status: None   Collection Time: 06/26/19 12:55 AM  Result Value Ref Range   Lipase 24 11 - 51 U/L    Comment: Performed at [facility] [address], [place], [place] [address]  Comprehensive metabolic panel     Status: Abnormal   Collection Time: 06/26/19 12:55 AM  Result Value Ref Range   Sodium 141 135 - 145 mmol/L   Potassium 3.3 (L) 3.5 - 5.1 mmol/L   Chloride 105 98 - 111 mmol/L   CO2 22 22 - 32 mmol/L   Glucose, Bld 126 (H) 70 - 99 mg/dL   BUN 31 (H) 8 - 23 mg/dL   Creatinine, Ser 1.58 (H) 0.61 - 1.24 mg/dL   Calcium 9.1 8.9 - 10.3 mg/dL   Total Protein 7.1 6.5 - 8.1 g/dL   Albumin 3.5 3.5 - 5.0 g/dL   AST 16 15 - 41 U/L   ALT 20 0 - 44 U/L   Alkaline Phosphatase 72 38 - 126 U/L   Total Bilirubin 0.3 0.3 - 1.2 mg/dL   GFR calc non Af Amer 45 (L) >60 mL/min   GFR calc Af Amer 52 (L) >60 mL/min   Anion gap 14 5 - 15    Comment: Performed at [facility] [address], [place], [place] [address]  CBC     Status: Abnormal   Collection Time: 06/26/19 12:55 AM  Result Value Ref Range   WBC 16.0 (H) 4.0 - 10.5 K/uL   RBC 4.66 4.22 - 5.81 MIL/uL   Hemoglobin 11.5 (L) 13.0 - 17.0 g/dL   HCT 36.7 (L) 39.0 - 52.0 %   MCV 78.8 (L) 80.0 - 100.0 fL   MCH 24.7 (L) 26.0 - 34.0 pg   MCHC 31.3 30.0 - 36.0 g/dL   RDW 15.5 11.5 - 15.5 %   Platelets 538 (H) 150 - 400 K/uL   nRBC 0.0 0.0 - 0.2 %    Comment: Performed at [facility], [place] [address],  [place] [address], [place] [address]  Protime-INR     Status: None   Collection Time: 06/26/19  1:52 AM  Result  Value Ref Range   Prothrombin Time 12.3 11.4 - 15.2 seconds   INR 0.9 0.8 - 1.2    Comment: (NOTE) INR goal varies based on device and disease states. Performed at [facility], [place] [address], [place], [place] [address]   Type and screen     Status: None   Collection Time: 06/26/19  2:08 AM  Result Value Ref Range   ABO/RH(D) A POS    Antibody Screen NEG    Sample Expiration      06/29/2019,2359 Performed at [facility], [place] [address], [place], [place] [address]   ABO/Rh     Status: None   Collection Time: 06/26/19  2:08 AM  Result Value Ref Range   ABO/RH(D)      A POS Performed at [facility] [address] [place], [place] [address]   Comprehensive metabolic panel     Status: Abnormal   Collection Time: 06/26/19  6:24 AM  Result Value Ref Range   Sodium 140 135 - 145 mmol/L   Potassium 3.5 3.5 - 5.1 mmol/L   Chloride 103 98 - 111 mmol/L   CO2 26 22 - 32 mmol/L   Glucose, Bld 119 (H) 70 - 99 mg/dL   BUN 26 (H) 8 - 23 mg/dL   Creatinine, Ser 1.25 (H) 0.61 - 1.24 mg/dL   Calcium 8.9 8.9 - 10.3 mg/dL   Total Protein 6.7 6.5 - 8.1 g/dL   Albumin 3.4 (L) 3.5 - 5.0 g/dL   AST 18 15 - 41 U/L   ALT 21 0 - 44 U/L   Alkaline Phosphatase 76 38 - 126 U/L   Total Bilirubin 0.5 0.3 - 1.2 mg/dL   GFR calc non Af Amer 60 (L) >60 mL/min   GFR calc Af Amer >60 >60 mL/min   Anion gap 11 5 - 15    Comment: Performed at [facility] [address] [place], [place] [address]  CBC     Status: Abnormal   Collection Time: 06/26/19  6:24 AM  Result Value Ref Range   WBC 16.9 (H) 4.0 - 10.5 K/uL   RBC 4.65 4.22 - 5.81 MIL/uL   Hemoglobin 11.6 (L) 13.0 - 17.0 g/dL   HCT 36.5 (L) 39.0 - 52.0 %   MCV 78.5 (L) 80.0 - 100.0 fL   MCH 24.9 (L) 26.0 - 34.0 pg   MCHC 31.8 30.0 - 36.0 g/dL   RDW 15.4 11.5 - 15.5 %   Platelets 486 (H) 150 - 400 K/uL   nRBC 0.0 0.0 -  0.2 %    Comment: Performed at [facility] [address] [place], [place] [address]  TSH     Status: None   Collection Time: 06/26/19  6:24 AM  Result Value Ref Range   TSH 1.823 0.350 - 4.500 uIU/mL    Comment: Performed by a 3rd Generation assay with a functional sensitivity of <=0.01 uIU/mL. Performed at [facility], [place] [address], [place], [place] [address]   Ferritin     Status: Abnormal   Collection Time: 06/26/19  6:24 AM  Result Value Ref Range   Ferritin 19 (L) 24 - 336 ng/mL    Comment: Performed at [facility], [place] [address], [place], [place] [address]  Iron and TIBC     Status: Abnormal   Collection Time: 06/26/19  6:24 AM  Result Value Ref Range   Iron 18 (L)  45 - 182 ug/dL   TIBC 367 250 - 450 ug/dL   Saturation Ratios 5 (L) 17.9 - 39.5 %   UIBC 349 ug/dL    Comment: Performed at [facility], [place] [address], [place], [place] [address]    Ct Abdomen Pelvis W Contrast  Result Date: 06/26/2019 CLINICAL DATA:  Generalized abdominal pain EXAM: CT ABDOMEN AND PELVIS WITH CONTRAST TECHNIQUE: Multidetector CT imaging of the abdomen and pelvis was performed using the standard protocol following bolus administration of intravenous contrast. CONTRAST:  164mL OMNIPAQUE IOHEXOL 300 MG/ML  SOLN COMPARISON:  10/03/2018 FINDINGS: Lower chest: Lung bases are clear. No effusions. Heart is normal size. Hepatobiliary: No focal hepatic abnormality. Gallbladder unremarkable. Pancreas: No focal abnormality or ductal dilatation. Spleen: No focal abnormality.  Normal size. Adrenals/Urinary Tract: No renal or adrenal mass. Right kidney is severely atrophic and calcified. No hydronephrosis. Urinary bladder unremarkable. Stomach/Bowel: There is an abnormal segment of colon in the distal transverse colon with marked wall thickening. Colon proximal to this is dilated. 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 Small bowel is decompressed. Stomach is  unremarkable. Vascular/Lymphatic: No evidence of aneurysm or adenopathy. Scattered calcifications. Reproductive: No visible focal abnormality. Other: No free fluid or free air. Musculoskeletal: Degenerative changes in the hips. No acute bony abnormality. IMPRESSION: Circumferential area of short segment wall thickening within the distal transverse colon extending for approximately 6 cm with dilated, distended colon proximal to this level. 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 This appears partially obstructive. Aortic atherosclerosis. Severely atrophic, calcified right kidney. Electronically Signed   By: Rolm Baptise M.D.   On: 06/26/2019 03:18   Dg Chest Port 1 View  Result Date: 06/26/2019 CLINICAL DATA:  Nausea and vomiting. EXAM: PORTABLE CHEST 1 VIEW COMPARISON:  06/20/2019 FINDINGS: The cardiomediastinal silhouette is unchanged with normal heart size. No airspace consolidation, edema, pleural effusion, pneumothorax is identified. Aortic atherosclerosis and thoracic spondylosis are noted. IMPRESSION: No active disease. Electronically Signed   By: Logan Bores M.D.   On: 06/26/2019 06:58    Review of Systems  Constitutional: Positive for weight loss.  HENT: Negative.   Eyes: Negative.   Respiratory: Negative.   Cardiovascular: Negative.   Gastrointestinal: Positive for abdominal pain, constipation, nausea and vomiting.  Genitourinary: Negative.   Musculoskeletal: Positive for joint pain (baseline).  Skin: Negative.   Neurological: Negative.   Endo/Heme/Allergies: Negative.   Psychiatric/Behavioral: Positive for depression and suicidal ideas.   Blood pressure (!) 110/57, pulse (!) 57, temperature 98.7 F (37.1 C), resp. rate 18, height 5\' 11"  (1.803 m), weight 64.9 kg, SpO2 100 %. Physical Exam  Constitutional: He is oriented to person, place, and time. He appears well-developed and well-nourished. No distress.  HENT:  Head: Normocephalic and atraumatic.  Eyes:  Pupils are equal, round, and reactive to light. Conjunctivae are normal. No scleral icterus.  Neck: Normal range of motion. Neck supple. No tracheal deviation present. No thyromegaly present.  Cardiovascular: Normal rate, regular rhythm, normal heart sounds and intact distal pulses. Exam reveals no gallop and no friction rub.  No murmur heard. Respiratory: Effort normal and breath sounds normal. No respiratory distress. He exhibits no tenderness.  GI: Soft. He exhibits distension (mildly distended). There is no abdominal tenderness. There is no rebound and no guarding.  Musculoskeletal: Normal range of motion.        General: No tenderness, deformity or edema.  Neurological: He is alert and oriented to person, place, and time. Coordination  normal.  Skin: Skin is warm and dry. No rash noted. He is not diaphoretic. No erythema. No pallor.  Psychiatric: He has a normal mood and affect. His behavior is normal. Judgment and thought content normal.    Assessment/Plan: Partially obstructing colon mass Weight loss Acute kidney injury Anemia EtOH abuse.  Likely has a colon cancer with mild chronic blood loss anemia. Recommend GI consult.  Hopefully patient can get prepped and have scope for dx.  No liver mets seen on CT.   Recommend cardiology consult/echo to make sure patient is maximized. Liver does not have imaging findings of cirrhosis and has good synthetic function. Not sure if patient is at risk for withdrawal at this point, but that is also a concern.   Will likely need partial colectomy this week as long as no unexpected findings.    Stark Klein 06/26/2019, 10:41 AM

## 2019-06-26 NOTE — ED Triage Notes (Signed)
Pt to ED with c/o mid abd pain onset approx 5pm today.  Pt also c/o nausea and vomiting x's 10

## 2019-06-26 NOTE — Progress Notes (Signed)
At 0620, received pt alert and oriented x 4. Pt ambulated to bed. Oriented pt to room and use of call light. EKG done. Pt's BP at 185/95 at this time. Amlodipine and Hydralazine ordered given. Will monitor pt.

## 2019-06-26 NOTE — ED Provider Notes (Signed)
[facility] EMERGENCY DEPARTMENT Provider Note   CSN: [id number] Arrival date & time: 06/26/19  0022     History   Chief Complaint Chief Complaint  Patient presents with  . Abdominal Pain  . Emesis    HPI Joe Garcia is a 68 y.o. male.     The history is provided by the patient.  Abdominal Pain Pain location:  Generalized Pain quality: sharp   Pain radiates to:  Does not radiate Pain severity:  Severe Onset quality:  Sudden Duration:  8 hours Timing:  Constant Progression:  Worsening Chronicity:  New Relieved by:  Nothing Worsened by:  Movement and palpation Associated symptoms: nausea, shortness of breath and vomiting   Associated symptoms: no chest pain, no cough, no fever, no hematemesis and no hematochezia   Emesis Associated symptoms: abdominal pain   Associated symptoms: no cough and no fever    Patient with history of CHF presents with sudden onset of abdominal pain with nausea vomiting. He reports he had an episode of this recently that was very brief and resolved This episode occurred approximately 8 hours ago and is worsening. Past Medical History:  Diagnosis Date  . Alcoholism ([place])   . Arthritis    hips, L shoulder  . Asthma   . CHF (congestive heart failure) ([place])   . Depression   . Family history of anesthesia complication   . Hyperlipidemia   . Hypertension     Patient Active Problem List   Diagnosis Date Noted  . COPD GOLD ?  11/24/2018  . Chest pain   . NSTEMI (non-ST elevated myocardial infarction) ([place]) 10/03/2018  . COPD with acute exacerbation ([place]) 07/26/2018  . Weight loss, unintentional 07/26/2018  . Malnutrition of moderate degree 07/16/2017  . 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 Solitary kidney, congenital 06/23/2017  . Nonischemic cardiomyopathy ([place])   . Multifocal PVCs   . Chronic combined systolic (congestive) and diastolic (congestive) heart failure ([place]) 06/11/2017  . DOE  (dyspnea on exertion) 07/16/2013  . Alcohol abuse 09/25/2012  . Alcohol dependence ([place]) 09/22/2012  . Essential hypertension 03/22/2007  . DEGENERATIVE DISC DISEASE 03/22/2007  . 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 LEFT HEART CATH AND CORONARY ANGIOGRAPHY N/A 10/04/2018   Procedure: LEFT HEART CATH AND CORONARY ANGIOGRAPHY;  Surgeon: Jettie Booze, MD;  Location: [facility];  Service: Cardiovascular;  Laterality: N/A;  . RIGHT/LEFT HEART CATH AND CORONARY ANGIOGRAPHY N/A 06/12/2017   Procedure: RIGHT/LEFT HEART CATH AND CORONARY ANGIOGRAPHY;  Surgeon: Jolaine Artist, MD;  Location: [facility];  Service: Cardiovascular;  Laterality: N/A;        Home Medications    Prior to Admission medications   Medication Sig Start Date End Date Taking? Authorizing Provider  acetaminophen (TYLENOL) 500 MG tablet Take 2 tablets (1,000 mg total) by mouth 3 (three) times daily as needed for headache (pain). 07/28/18   Dhungel, Nishant, MD  albuterol (PROVENTIL) (2.5 MG/3ML) 0.083% nebulizer solution Take 3 mLs (2.5 mg total) by nebulization every 6 (six) hours as needed for wheezing or shortness of breath. 06/20/19   Bast, Tressia Miners A, NP  albuterol (VENTOLIN HFA) 108 (90 Base) MCG/ACT inhaler Inhale 1-2 puffs into the lungs every 4 (four) hours as needed for wheezing or shortness of breath. 01/29/19   Hall-Potvin, [place], PA-C  amiodarone (PACERONE) 100 MG tablet Take 1 tablet (100 mg total) by mouth daily.  06/21/19   Larey Dresser, MD  amLODipine (NORVASC) 5 MG tablet Take 5 mg by mouth daily.    [provider]  budesonide-formoterol (SYMBICORT) 160-4.5 MCG/ACT inhaler Inhale 2 puffs into the lungs 2 (two) times daily. 11/23/18   Tanda Rockers, MD  carvedilol (COREG) 3.125 MG tablet Take 1 tablet (3.125 mg total) by mouth 2 (two) times daily with a meal. 04/15/19   Bensimhon, Shaune Pascal, MD  fluticasone (FLONASE) 50  MCG/ACT nasal spray Place 2 sprays into both nostrils daily. 02/23/18   Melynda Ripple, MD  furosemide (LASIX) 20 MG tablet Take 1 tablet (20 mg total) by mouth daily. 12/09/18   Clegg, Amy D, NP  isosorbide-hydrALAZINE (BIDIL) 20-37.5 MG tablet Take 1 tablet by mouth 2 (two) times daily.     [provider]  loratadine (CLARITIN) 10 MG tablet Take 10 mg by mouth daily.    [provider]  losartan (COZAAR) 25 MG tablet TAKE 1 TABLET BY MOUTH EVERY DAY 05/30/19   Bensimhon, Shaune Pascal, MD  montelukast (SINGULAIR) 10 MG tablet Take 10 mg by mouth at bedtime.    [provider]  polyethylene glycol powder (GLYCOLAX/MIRALAX) powder Take 17 g by mouth daily as needed for mild constipation.    [provider]  spironolactone (ALDACTONE) 25 MG tablet Take 1 tablet (25 mg total) by mouth daily. 06/01/19 08/30/19  Conrad Grainola, NP    Family History Family History  Problem Relation Age of Onset  . Heart disease Father   . Heart disease Daughter        "fluid around heart"   . Canavan disease Daughter   . Cancer Daughter        unsure of type  . Asthma Son   . Heart failure Mother   . Kidney failure Mother     Social History Social History   Tobacco Use  . Smoking status: Former Smoker    Packs/day: 0.10    Years: 45.00    Pack years: 4.50    Quit date: 2012    Years since quitting: 8.9  . Smokeless tobacco: Never Used  Substance Use Topics  . Alcohol use: No    Frequency: Never    Comment: Pt states no etoh since April 2014  . Drug use: No    Comment: Prior use of crack cocaine, quit 2012     Allergies   Lisinopril   Review of Systems Review of Systems  Constitutional: Negative for fever.  Respiratory: Positive for shortness of breath. Negative for cough.   Cardiovascular: Negative for chest pain.  Gastrointestinal: Positive for abdominal pain, nausea and vomiting. Negative for hematemesis and hematochezia.  All other systems reviewed and  are negative.    Physical Exam Updated Vital Signs BP 108/74 (BP Location: Left Arm)   Pulse 94   Temp 98.7 F (37.1 C) (Oral)   Resp 19   Ht 1.803 m (5\' 11" )   Wt 64.9 kg   SpO2 100%   BMI 19.94 kg/m   Physical Exam  CONSTITUTIONAL: Well developed, ill-appearing HEAD: Normocephalic/atraumatic EYES: EOMI/PERRL ENMT: Mucous membranes moist NECK: supple no meningeal signs SPINE/BACK:entire spine nontender CV: S1/S2 noted, no murmurs/rubs/gallops noted LUNGS: Lungs are clear to auscultation bilaterally, no apparent distress ABDOMEN: soft, diffuse significant tenderness throughout all 4 quadrants, patient with guarding GU:no cva tenderness NEURO: Pt is awake/alert/appropriate, moves all extremitiesx4.  No facial droop.   EXTREMITIES: pulses normal/equalx4, full ROM SKIN: warm, color normal PSYCH: Anxious  ED Treatments / Results  Labs (all labs ordered are listed, but only abnormal results are displayed) Labs Reviewed  COMPREHENSIVE METABOLIC PANEL - Abnormal; Notable for the following components:      Result Value   Potassium 3.3 (*)    Glucose, Bld 126 (*)    BUN 31 (*)    Creatinine, Ser 1.58 (*)    GFR calc non Af Amer 45 (*)    GFR calc Af Amer 52 (*)    All other components within normal limits  CBC - Abnormal; Notable for the following components:   WBC 16.0 (*)    Hemoglobin 11.5 (*)    HCT 36.7 (*)    MCV 78.8 (*)    MCH 24.7 (*)    Platelets 538 (*)    All other components within normal limits  SARS CORONAVIRUS 2 (TAT 6-24 HRS)  LIPASE, BLOOD  PROTIME-INR  URINALYSIS, ROUTINE W REFLEX MICROSCOPIC  TYPE AND SCREEN  ABO/RH    EKG ED ECG REPORT   Date: 06/26/2019 0148  Rate: 81  Rhythm: normal sinus rhythm  QRS Axis: normal  Intervals: normal  ST/T Wave abnormalities: nonspecific ST changes  Conduction Disutrbances:none PVC noted  I have personally reviewed the EKG tracing and agree with the computerized printout as noted.  Radiology Ct  Abdomen Pelvis W Contrast  Result Date: 06/26/2019 CLINICAL DATA:  Generalized abdominal pain EXAM: CT ABDOMEN AND PELVIS WITH CONTRAST TECHNIQUE: Multidetector CT imaging of the abdomen and pelvis was performed using the standard protocol following bolus administration of intravenous contrast. CONTRAST:  12mL OMNIPAQUE IOHEXOL 300 MG/ML  SOLN COMPARISON:  10/03/2018 FINDINGS: Lower chest: Lung bases are clear. No effusions. Heart is normal size. Hepatobiliary: No focal hepatic abnormality. Gallbladder unremarkable. Pancreas: No focal abnormality or ductal dilatation. Spleen: No focal abnormality.  Normal size. Adrenals/Urinary Tract: No renal or adrenal mass. Right kidney is severely atrophic and calcified. No hydronephrosis. Urinary bladder unremarkable. Stomach/Bowel: There is an abnormal segment of colon in the distal transverse colon with marked wall thickening. Colon proximal to this is dilated. 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 Small bowel is decompressed. Stomach is unremarkable. Vascular/Lymphatic: No evidence of aneurysm or adenopathy. Scattered calcifications. Reproductive: No visible focal abnormality. Other: No free fluid or free air. Musculoskeletal: Degenerative changes in the hips. No acute bony abnormality. IMPRESSION: Circumferential area of short segment wall thickening within the distal transverse colon extending for approximately 6 cm with dilated, distended colon proximal to this level. 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 This appears partially obstructive. Aortic atherosclerosis. Severely atrophic, calcified right kidney. Electronically Signed   By: Rolm Baptise M.D.   On: 06/26/2019 03:18    Procedures Procedures   Medications Ordered in ED Medications  sodium chloride flush (NS) 0.9 % injection 3 mL (has no administration in time range)  ondansetron (ZOFRAN) injection 4 mg (4 mg Intravenous Given 06/26/19 0155)  HYDROmorphone  (DILAUDID) injection 1 mg (1 mg Intravenous Given 06/26/19 0156)  sodium chloride 0.9 % bolus 500 mL (0 mLs Intravenous Stopped 06/26/19 0317)  iohexol (OMNIPAQUE) 300 MG/ML solution 100 mL (100 mLs Intravenous Contrast Given 06/26/19 0259)  HYDROmorphone (DILAUDID) injection 1 mg (1 mg Intravenous Given 06/26/19 0333)  HYDROmorphone (DILAUDID) injection 1 mg (1 mg Intravenous Given 06/26/19 0401)     Initial Impression / Assessment and Plan / ED Course  I have reviewed the triage vital signs and the nursing notes.  Pertinent labs & imaging results that  were available during my care of the patient were reviewed by me and considered in my medical decision making (see chart for details).        1:54 AM Patient presents with sudden onset of abdominal pain with nausea vomiting.  Patient has significant diffuse abdominal tenderness with guarding I am concerned about perforated viscus.  We will start with acute abdominal series since labs are pending at this time.  Of note he had a CT angio of his abdomen earlier this year that did not reveal an aortic issue 2:51 AM There was a delay in x-ray, therefore we will proceed with CT imaging as all his labs have resulted BP (!) 182/83   Pulse 94   Temp 98.7 F (37.1 C) (Oral)   Resp (!) 22   Ht 1.803 m (5\' 11" )   Wt 64.9 kg   SpO2 100%   BMI 19.94 kg/m  3:29 AM CT imaging reveals apple core lesion likely malignancy with partially obstructive.  No free air.  Discussed the case with Dr. Georgette Dover with general surgery.  He recommends medical admission for IV hydration.  GI consultation and then General surgery will follow along 4:11 AM Discussed radiology findings with the radiologist.  The CT images were reviewed again there is no signs of pneumoperitoneum Discussed the findings with patient and his daughter via phone.  Informed that this could represent cancer. Informed patient he'll need to be admitted to be seen by gastroenterology and general  surgery.  Patient is still having significant abdominal pain Pain has been treated here in the ER.  Discussed with Dr. Maudie Mercury for admission to the hospitalist Final Clinical Impressions(s) / ED Diagnoses   Final diagnoses:  Intractable vomiting with nausea, unspecified vomiting type  Malignant neoplasm of transverse colon [facility])  Colonic obstruction [facility])    ED Discharge Orders    None       Ripley Fraise, MD 06/26/19 [phone]

## 2019-06-26 NOTE — H&P (View-Only) (Signed)
UNASSIGNED PATIENT CROSS COVER [facility]-GI Reason for Consult: Colonic mass in transverse colon on CT scan. Referring Physician: THP.  Joe Garcia is an 68 y.o. male.  HPI: Joe Garcia is a 68 year old black male, with multiple medical problems listed below. He presents to the emergency room at [facility] with a history of abdominal pain nausea and vomiting intermittently, with worsening constipation and change in the caliber of the stool, for the last 5 months. Patient claims he has had 3 distinct episodes of abdominal pain nausea and vomiting. He is also lost about 10 pounds in the last 5 to 6 months.  He denies any history of melena hematochezia his appetite is poor there is no history of fever chills rigors shortness of breath or cardiopulmonary problems. He has never had a colonoscopy. He denies a family history of colon cancer or IBD. 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   Past Medical History:  Diagnosis Date  . Alcoholism ([place])   . Arthritis    hips, L shoulder  . Asthma   . CHF (congestive heart failure) ([place])   . Depression   . Family history of anesthesia complication   . Hyperlipidemia   . Hypertension    Past Surgical History:  Procedure Laterality Date  . LEFT HEART CATH AND CORONARY ANGIOGRAPHY N/A 10/04/2018   Procedure: LEFT HEART CATH AND CORONARY ANGIOGRAPHY;  Surgeon: Jettie Booze, MD;  Location: [facility];  Service: Cardiovascular;  Laterality: N/A;  . RIGHT/LEFT HEART CATH AND CORONARY ANGIOGRAPHY N/A 06/12/2017   Procedure: RIGHT/LEFT HEART CATH AND CORONARY ANGIOGRAPHY;  Surgeon: Jolaine Artist, MD;  Location: [facility];  Service: Cardiovascular;  Laterality: N/A;   Family History  Problem Relation Age of Onset  . Heart  disease Father   . Heart disease Daughter        "fluid around heart"   . Canavan disease Daughter   . Cancer Daughter        unsure of type  . Asthma Son   . Heart failure Mother   . Kidney failure Mother    Social History:  reports that he quit smoking about 8 years ago. He has a 4.50 pack-year smoking history. He has never used smokeless tobacco. He reports that he does not drink alcohol or use drugs.  Allergies:  Allergies  Allergen Reactions  . Lisinopril Swelling    Facial and tongue swelling 10/2012   Medications: I have reviewed the patient's current medications.  Results for orders placed or performed during the hospital encounter of 06/26/19 (from the past 48 hour(s))  Urinalysis, Routine w reflex microscopic     Status: Abnormal   Collection Time: 06/26/19 12:51 AM  Result Value Ref Range   Color, Urine STRAW (A) YELLOW   APPearance CLEAR CLEAR   Specific Gravity, Urine 1.025 1.005 - 1.030   pH 6.0 5.0 - 8.0   Glucose, UA NEGATIVE NEGATIVE mg/dL   Hgb urine dipstick NEGATIVE NEGATIVE   Bilirubin Urine NEGATIVE NEGATIVE   Ketones, ur NEGATIVE NEGATIVE mg/dL   Protein, ur NEGATIVE NEGATIVE mg/dL   Nitrite NEGATIVE NEGATIVE   Leukocytes,Ua NEGATIVE NEGATIVE    Comment: Performed at [facility] [address], [place], [place] [address]  Lipase, blood  Status: None   Collection Time: 06/26/19 12:55 AM  Result Value Ref Range   Lipase 24 11 - 51 U/L    Comment: Performed at [facility], [place] [address], [place], [place] [address]  Comprehensive metabolic panel     Status: Abnormal   Collection Time: 06/26/19 12:55 AM  Result Value Ref Range   Sodium 141 135 - 145 mmol/L   Potassium 3.3 (L) 3.5 - 5.1 mmol/L   Chloride 105 98 - 111 mmol/L   CO2 22 22 - 32 mmol/L   Glucose, Bld 126 (H) 70 - 99 mg/dL   BUN 31 (H) 8 - 23 mg/dL   Creatinine, Ser 1.58 (H) 0.61 - 1.24 mg/dL   Calcium 9.1 8.9 - 10.3 mg/dL   Total Protein 7.1 6.5 - 8.1 g/dL   Albumin  3.5 3.5 - 5.0 g/dL   AST 16 15 - 41 U/L   ALT 20 0 - 44 U/L   Alkaline Phosphatase 72 38 - 126 U/L   Total Bilirubin 0.3 0.3 - 1.2 mg/dL   GFR calc non Af Amer 45 (L) >60 mL/min   GFR calc Af Amer 52 (L) >60 mL/min   Anion gap 14 5 - 15    Comment: Performed at [facility] [address], [place], [place] [address]  CBC     Status: Abnormal   Collection Time: 06/26/19 12:55 AM  Result Value Ref Range   WBC 16.0 (H) 4.0 - 10.5 K/uL   RBC 4.66 4.22 - 5.81 MIL/uL   Hemoglobin 11.5 (L) 13.0 - 17.0 g/dL   HCT 36.7 (L) 39.0 - 52.0 %   MCV 78.8 (L) 80.0 - 100.0 fL   MCH 24.7 (L) 26.0 - 34.0 pg   MCHC 31.3 30.0 - 36.0 g/dL   RDW 15.5 11.5 - 15.5 %   Platelets 538 (H) 150 - 400 K/uL   nRBC 0.0 0.0 - 0.2 %    Comment: Performed at [facility], [place] [address] [place], [place] [address]  Protime-INR     Status: None   Collection Time: 06/26/19  1:52 AM  Result Value Ref Range   Prothrombin Time 12.3 11.4 - 15.2 seconds   INR 0.9 0.8 - 1.2    Comment: (NOTE) INR goal varies based on device and disease states. Performed at [facility], [place] [address], [place], [place] [address]   Type and screen     Status: None   Collection Time: 06/26/19  2:08 AM  Result Value Ref Range   ABO/RH(D) A POS    Antibody Screen NEG    Sample Expiration      06/29/2019,2359 Performed at [facility], [place] [address], [place], [place] [address]   ABO/Rh     Status: None (Preliminary result)   Collection Time: 06/26/19  2:08 AM  Result Value Ref Range   ABO/RH(D)      A POS Performed at [facility] [address], [place], [place] [address]   Comprehensive metabolic panel     Status: Abnormal   Collection Time: 06/26/19  6:24 AM  Result Value Ref Range   Sodium 140 135 - 145 mmol/L   Potassium 3.5 3.5 - 5.1 mmol/L   Chloride 103 98 - 111 mmol/L   CO2 26 22 - 32 mmol/L   Glucose, Bld 119 (H) 70 - 99 mg/dL   BUN 26 (H) 8 - 23 mg/dL   Creatinine,  Ser 1.25 (H)  0.61 - 1.24 mg/dL   Calcium 8.9 8.9 - 10.3 mg/dL   Total Protein 6.7 6.5 - 8.1 g/dL   Albumin 3.4 (L) 3.5 - 5.0 g/dL   AST 18 15 - 41 U/L   ALT 21 0 - 44 U/L   Alkaline Phosphatase 76 38 - 126 U/L   Total Bilirubin 0.5 0.3 - 1.2 mg/dL   GFR calc non Af Amer 60 (L) >60 mL/min   GFR calc Af Amer >60 >60 mL/min   Anion gap 11 5 - 15    Comment: Performed at [facility] [address], [place], [place] [address]  CBC     Status: Abnormal   Collection Time: 06/26/19  6:24 AM  Result Value Ref Range   WBC 16.9 (H) 4.0 - 10.5 K/uL   RBC 4.65 4.22 - 5.81 MIL/uL   Hemoglobin 11.6 (L) 13.0 - 17.0 g/dL   HCT 36.5 (L) 39.0 - 52.0 %   MCV 78.5 (L) 80.0 - 100.0 fL   MCH 24.9 (L) 26.0 - 34.0 pg   MCHC 31.8 30.0 - 36.0 g/dL   RDW 15.4 11.5 - 15.5 %   Platelets 486 (H) 150 - 400 K/uL   nRBC 0.0 0.0 - 0.2 %    Comment: Performed at [facility] [address], [place], [place] [address]  Ferritin     Status: Abnormal   Collection Time: 06/26/19  6:24 AM  Result Value Ref Range   Ferritin 19 (L) 24 - 336 ng/mL    Comment: Performed at [facility], [place] [address] [place], [place] [address]  Iron and TIBC     Status: Abnormal   Collection Time: 06/26/19  6:24 AM  Result Value Ref Range   Iron 18 (L) 45 - 182 ug/dL   TIBC 367 250 - 450 ug/dL   Saturation Ratios 5 (L) 17.9 - 39.5 %   UIBC 349 ug/dL    Comment: Performed at [facility], [place] [address], [place], [place] [address]    Ct Abdomen Pelvis W Contrast  Result Date: 06/26/2019 CLINICAL DATA:  Generalized abdominal pain EXAM: CT ABDOMEN AND PELVIS WITH CONTRAST TECHNIQUE: Multidetector CT imaging of the abdomen and pelvis was performed using the standard protocol following bolus administration of intravenous contrast. CONTRAST:  173mL OMNIPAQUE IOHEXOL 300 MG/ML  SOLN COMPARISON:  10/03/2018 FINDINGS: Lower chest: Lung bases are clear. No effusions. Heart is normal size. Hepatobiliary: No focal hepatic  abnormality. Gallbladder unremarkable. Pancreas: No focal abnormality or ductal dilatation. Spleen: No focal abnormality.  Normal size. Adrenals/Urinary Tract: No renal or adrenal mass. Right kidney is severely atrophic and calcified. No hydronephrosis. Urinary bladder unremarkable. Stomach/Bowel: There is an abnormal segment of colon in the distal transverse colon with marked wall thickening. Colon proximal to this is dilated. 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 Small bowel is decompressed. Stomach is unremarkable. Vascular/Lymphatic: No evidence of aneurysm or adenopathy. Scattered calcifications. Reproductive: No visible focal abnormality. Other: No free fluid or free air. Musculoskeletal: Degenerative changes in the hips. No acute bony abnormality. IMPRESSION: Circumferential area of short segment wall thickening within the distal transverse colon extending for approximately 6 cm with dilated, distended colon proximal to this level. 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 This appears partially obstructive. Aortic atherosclerosis. Severely atrophic, calcified right kidney. Electronically Signed   By: Rolm Baptise M.D.   On: 06/26/2019 03:18   Dg  Chest Port 1 View  Result Date: 06/26/2019 CLINICAL DATA:  Nausea and vomiting. EXAM: PORTABLE CHEST 1 VIEW COMPARISON:  06/20/2019 FINDINGS: The cardiomediastinal silhouette is unchanged with normal heart size. No airspace consolidation, edema, pleural effusion, pneumothorax is identified. Aortic atherosclerosis and thoracic spondylosis are noted. IMPRESSION: No active disease. Electronically Signed   By: Logan Bores M.D.   On: 06/26/2019 06:58    Review of Systems  Constitutional: Positive for malaise/fatigue and weight loss. Negative for chills, diaphoresis and fever.  HENT: Negative.   Eyes: Negative.   Respiratory: Negative.   Cardiovascular: Negative.   Gastrointestinal: Positive for abdominal pain,  constipation, nausea and vomiting. Negative for blood in stool and melena.  Genitourinary: Negative.   Skin: Negative.   Neurological: Negative.   Endo/Heme/Allergies: Negative.   Psychiatric/Behavioral: Positive for depression, substance abuse and suicidal ideas. Negative for memory loss. The patient is nervous/anxious. The patient does not have insomnia.    Blood pressure (!) 185/95, pulse 69, temperature 98.7 F (37.1 C), resp. rate 18, height 5\' 11"  (1.803 m), weight 64.9 kg, SpO2 100 %. Physical Exam  Constitutional: He is oriented to person, place, and time. He appears well-developed and well-nourished.  HENT:  Head: Normocephalic and atraumatic.  Eyes: Pupils are equal, round, and reactive to light. Conjunctivae and EOM are normal.  Neck: Normal range of motion. Neck supple.  Cardiovascular: Normal rate and regular rhythm.  Respiratory: Effort normal and breath sounds normal.  GI: Soft. Bowel sounds are normal. He exhibits distension. He exhibits no mass. There is abdominal tenderness. There is no rebound and no guarding.  Musculoskeletal: Normal range of motion.  Neurological: He is alert and oriented to person, place, and time.  Skin: Skin is warm and dry.  Psychiatric: He has a normal mood and affect. His behavior is normal. Judgment and thought content normal.   Assessment/Plan: 1) Transverse colon mass on CT scan with iron deficiency anemia-Ferritin of 19 and a 10 pound weight loss over the last 5 to 6 months/change in bowel habits/change in the caliber of the stool , concerning for malignancy-will plan to do a colonoscopy tomorrow. 2) Alcohol abuse. 3) CHF/HTN/Hyperlipidemia 4) Asthma. 5) Arthitis. 6) Depression/history of suicidal ideation Juanita Craver 06/26/2019, 7:51 AM

## 2019-06-27 ENCOUNTER — Encounter (HOSPITAL_COMMUNITY)
Admission: EM | Disposition: A | Discharge status: Home / Self Care | Payer: Self-pay | Source: Home / Self Care | Attending: Student

## 2019-06-27 ENCOUNTER — Observation Stay (HOSPITAL_COMMUNITY): Payer: Medicare Other | Admitting: Anesthesiology

## 2019-06-27 ENCOUNTER — Other Ambulatory Visit (HOSPITAL_COMMUNITY): Payer: Self-pay

## 2019-06-27 ENCOUNTER — Other Ambulatory Visit: Payer: Self-pay

## 2019-06-27 DIAGNOSIS — Z841 Family history of disorders of kidney and ureter: Secondary | ICD-10-CM | POA: Diagnosis not present

## 2019-06-27 DIAGNOSIS — D490 Neoplasm of unspecified behavior of digestive system: Secondary | ICD-10-CM

## 2019-06-27 DIAGNOSIS — K56609 Unspecified intestinal obstruction, unspecified as to partial versus complete obstruction: Secondary | ICD-10-CM | POA: Diagnosis not present

## 2019-06-27 DIAGNOSIS — E785 Hyperlipidemia, unspecified: Secondary | ICD-10-CM | POA: Diagnosis present

## 2019-06-27 DIAGNOSIS — K6389 Other specified diseases of intestine: Secondary | ICD-10-CM

## 2019-06-27 DIAGNOSIS — I252 Old myocardial infarction: Secondary | ICD-10-CM | POA: Diagnosis not present

## 2019-06-27 DIAGNOSIS — R1084 Generalized abdominal pain: Secondary | ICD-10-CM | POA: Diagnosis present

## 2019-06-27 DIAGNOSIS — R112 Nausea with vomiting, unspecified: Secondary | ICD-10-CM | POA: Diagnosis not present

## 2019-06-27 DIAGNOSIS — Z20828 Contact with and (suspected) exposure to other viral communicable diseases: Secondary | ICD-10-CM | POA: Diagnosis not present

## 2019-06-27 DIAGNOSIS — I10 Essential (primary) hypertension: Secondary | ICD-10-CM | POA: Diagnosis not present

## 2019-06-27 DIAGNOSIS — I130 Hypertensive heart and chronic kidney disease with heart failure and stage 1 through stage 4 chronic kidney disease, or unspecified chronic kidney disease: Secondary | ICD-10-CM | POA: Diagnosis not present

## 2019-06-27 DIAGNOSIS — Z8249 Family history of ischemic heart disease and other diseases of the circulatory system: Secondary | ICD-10-CM | POA: Diagnosis not present

## 2019-06-27 DIAGNOSIS — Z809 Family history of malignant neoplasm, unspecified: Secondary | ICD-10-CM | POA: Diagnosis not present

## 2019-06-27 DIAGNOSIS — I5042 Chronic combined systolic (congestive) and diastolic (congestive) heart failure: Secondary | ICD-10-CM

## 2019-06-27 DIAGNOSIS — Z681 Body mass index (BMI) 19 or less, adult: Secondary | ICD-10-CM | POA: Diagnosis not present

## 2019-06-27 DIAGNOSIS — Z7951 Long term (current) use of inhaled steroids: Secondary | ICD-10-CM | POA: Diagnosis not present

## 2019-06-27 DIAGNOSIS — C786 Secondary malignant neoplasm of retroperitoneum and peritoneum: Secondary | ICD-10-CM | POA: Diagnosis not present

## 2019-06-27 DIAGNOSIS — C772 Secondary and unspecified malignant neoplasm of intra-abdominal lymph nodes: Secondary | ICD-10-CM | POA: Diagnosis not present

## 2019-06-27 DIAGNOSIS — Z79899 Other long term (current) drug therapy: Secondary | ICD-10-CM | POA: Diagnosis not present

## 2019-06-27 DIAGNOSIS — N179 Acute kidney failure, unspecified: Secondary | ICD-10-CM | POA: Diagnosis not present

## 2019-06-27 DIAGNOSIS — J449 Chronic obstructive pulmonary disease, unspecified: Secondary | ICD-10-CM | POA: Diagnosis present

## 2019-06-27 DIAGNOSIS — K5669 Other partial intestinal obstruction: Secondary | ICD-10-CM | POA: Diagnosis not present

## 2019-06-27 DIAGNOSIS — M19012 Primary osteoarthritis, left shoulder: Secondary | ICD-10-CM | POA: Diagnosis present

## 2019-06-27 DIAGNOSIS — Z23 Encounter for immunization: Secondary | ICD-10-CM | POA: Diagnosis not present

## 2019-06-27 DIAGNOSIS — I5022 Chronic systolic (congestive) heart failure: Secondary | ICD-10-CM | POA: Diagnosis not present

## 2019-06-27 DIAGNOSIS — E440 Moderate protein-calorie malnutrition: Secondary | ICD-10-CM | POA: Diagnosis not present

## 2019-06-27 DIAGNOSIS — N1831 Chronic kidney disease, stage 3a: Secondary | ICD-10-CM | POA: Diagnosis present

## 2019-06-27 DIAGNOSIS — Z825 Family history of asthma and other chronic lower respiratory diseases: Secondary | ICD-10-CM | POA: Diagnosis not present

## 2019-06-27 DIAGNOSIS — M160 Bilateral primary osteoarthritis of hip: Secondary | ICD-10-CM | POA: Diagnosis present

## 2019-06-27 DIAGNOSIS — E876 Hypokalemia: Secondary | ICD-10-CM | POA: Diagnosis not present

## 2019-06-27 DIAGNOSIS — C184 Malignant neoplasm of transverse colon: Secondary | ICD-10-CM | POA: Diagnosis not present

## 2019-06-27 DIAGNOSIS — I428 Other cardiomyopathies: Secondary | ICD-10-CM | POA: Diagnosis not present

## 2019-06-27 DIAGNOSIS — Z01810 Encounter for preprocedural cardiovascular examination: Secondary | ICD-10-CM | POA: Diagnosis not present

## 2019-06-27 HISTORY — PX: BIOPSY: SHX5522

## 2019-06-27 HISTORY — PX: COLONOSCOPY: SHX5424

## 2019-06-27 LAB — CBC WITH DIFFERENTIAL/PLATELET
Abs Immature Granulocytes: 0.09 10*3/uL — ABNORMAL HIGH (ref 0.00–0.07)
Basophils Absolute: 0 10*3/uL (ref 0.0–0.1)
Basophils Relative: 0 %
Eosinophils Absolute: 0.2 10*3/uL (ref 0.0–0.5)
Eosinophils Relative: 3 %
HCT: 29.8 % — ABNORMAL LOW (ref 39.0–52.0)
Hemoglobin: 9.4 g/dL — ABNORMAL LOW (ref 13.0–17.0)
Immature Granulocytes: 1 %
Lymphocytes Relative: 21 %
Lymphs Abs: 1.9 10*3/uL (ref 0.7–4.0)
MCH: 24.5 pg — ABNORMAL LOW (ref 26.0–34.0)
MCHC: 31.5 g/dL (ref 30.0–36.0)
MCV: 77.8 fL — ABNORMAL LOW (ref 80.0–100.0)
Monocytes Absolute: 0.7 10*3/uL (ref 0.1–1.0)
Monocytes Relative: 8 %
Neutro Abs: 6.3 10*3/uL (ref 1.7–7.7)
Neutrophils Relative %: 67 %
Platelets: 325 10*3/uL (ref 150–400)
RBC: 3.83 MIL/uL — ABNORMAL LOW (ref 4.22–5.81)
RDW: 15.7 % — ABNORMAL HIGH (ref 11.5–15.5)
WBC: 9.3 10*3/uL (ref 4.0–10.5)
nRBC: 0 % (ref 0.0–0.2)

## 2019-06-27 LAB — BASIC METABOLIC PANEL
Anion gap: 11 (ref 5–15)
BUN: 19 mg/dL (ref 8–23)
CO2: 23 mmol/L (ref 22–32)
Calcium: 8.3 mg/dL — ABNORMAL LOW (ref 8.9–10.3)
Chloride: 102 mmol/L (ref 98–111)
Creatinine, Ser: 1.33 mg/dL — ABNORMAL HIGH (ref 0.61–1.24)
GFR calc Af Amer: >60 mL/min (ref >60)
GFR calc non Af Amer: 55 mL/min — ABNORMAL LOW (ref >60)
Glucose, Bld: 107 mg/dL — ABNORMAL HIGH (ref 70–99)
Potassium: 3.2 mmol/L — ABNORMAL LOW (ref 3.5–5.1)
Sodium: 136 mmol/L (ref 135–145)

## 2019-06-27 SURGERY — COLONOSCOPY
Anesthesia: Monitor Anesthesia Care | Laterality: Left

## 2019-06-27 MED ORDER — POTASSIUM CHLORIDE CRYS ER 20 MEQ PO TBCR
40.0000 meq | EXTENDED_RELEASE_TABLET | Freq: Once | ORAL | Status: AC
  Administered 2019-06-27: 40 meq via ORAL
  Filled 2019-06-27: qty 2

## 2019-06-27 MED ORDER — AMIODARONE HCL 100 MG PO TABS: 100.0000 mg | ORAL_TABLET | Freq: Every day | ORAL | 6 refills | Status: DC

## 2019-06-27 MED ORDER — POTASSIUM CHLORIDE 10 MEQ/100ML IV SOLN: 10.0000 meq | INTRAVENOUS | Status: DC

## 2019-06-27 MED ORDER — POLYETHYLENE GLYCOL 3350 17 G PO PACK
17.0000 g | PACK | Freq: Two times a day (BID) | ORAL | Status: DC
  Administered 2019-06-27 – 2019-06-29 (×3): 17 g via ORAL
  Filled 2019-06-27 (×5): qty 1

## 2019-06-27 MED ORDER — POTASSIUM CHLORIDE 10 MEQ/100ML IV SOLN
10.0000 meq | INTRAVENOUS | Status: AC
  Administered 2019-06-27: 10 meq via INTRAVENOUS

## 2019-06-27 MED ORDER — PROPOFOL 10 MG/ML IV BOLUS
INTRAVENOUS | Status: DC | PRN
  Administered 2019-06-27: 30 mg via INTRAVENOUS
  Administered 2019-06-27: 20 mg via INTRAVENOUS

## 2019-06-27 MED ORDER — SPOT INK MARKER SYRINGE KIT
PACK | SUBMUCOSAL | Status: AC
  Filled 2019-06-27: qty 5

## 2019-06-27 MED ORDER — SODIUM CHLORIDE 0.9 % IV SOLN
INTRAVENOUS | Status: AC | PRN
  Administered 2019-06-27: 500 mL via INTRAVENOUS

## 2019-06-27 MED ORDER — PROPOFOL 500 MG/50ML IV EMUL
INTRAVENOUS | Status: DC | PRN
  Administered 2019-06-27: 100 ug/kg/min via INTRAVENOUS

## 2019-06-27 NOTE — Care Management Obs Status (Signed)
[facility] NOTIFICATION   Patient Details  Name: Joe Garcia MRN: [id number] Date of Birth: October 15, 1950   Medicare Observation Status Notification Given:  Yes    Marilu Favre, RN 06/27/2019, 11:41 AM

## 2019-06-27 NOTE — Anesthesia Postprocedure Evaluation (Signed)
Anesthesia Post Note  Patient: Joe Garcia  Procedure(s) Performed: COLONOSCOPY (Left )     Patient location during evaluation: PACU Anesthesia Type: MAC Level of consciousness: awake and alert Pain management: pain level controlled Vital Signs Assessment: post-procedure vital signs reviewed and stable Respiratory status: spontaneous breathing, nonlabored ventilation and respiratory function stable Cardiovascular status: blood pressure returned to baseline and stable Postop Assessment: no apparent nausea or vomiting Anesthetic complications: no    Last Vitals:  Vitals:   06/27/19 1350 06/27/19 1457  BP: (!) 142/67 (!) 131/54  Pulse: 60 62  Resp: 12 18  Temp:  36.4 C  SpO2: 100% 100%    Last Pain:  Vitals:   06/27/19 1457  TempSrc: Oral  PainSc:                  Pervis Hocking

## 2019-06-27 NOTE — Anesthesia Procedure Notes (Signed)
Procedure Name: MAC Date/Time: 06/27/2019 12:58 PM Performed by: Alain Marion, CRNA Pre-anesthesia Checklist: Patient identified, Emergency Drugs available, Suction available, Patient being monitored and Timeout performed Oxygen Delivery Method: Nasal cannula Placement Confirmation: positive ETCO2

## 2019-06-27 NOTE — Transfer of Care (Signed)
Immediate Anesthesia Transfer of Care Note  Patient: Joe Garcia  Procedure(s) Performed: COLONOSCOPY (Left )  Patient Location: Endoscopy Unit  Anesthesia Type:MAC  Level of Consciousness: awake, alert  and oriented  Airway & Oxygen Therapy: Patient Spontanous Breathing and Patient connected to nasal cannula oxygen  Post-op Assessment: Report given to RN and Post -op Vital signs reviewed and stable  Post vital signs: Reviewed and stable  Last Vitals:  Vitals Value Taken Time  BP 102/69 06/27/19 1330  Temp 36.5 C 06/27/19 1330  Pulse 56 06/27/19 1332  Resp 17 06/27/19 1332  SpO2 100 % 06/27/19 1332  Vitals shown include unvalidated device data.  Last Pain:  Vitals:   06/27/19 1330  TempSrc: Temporal  PainSc: 0-No pain      Patients Stated Pain Goal: 0 (39/53/20 2334)  Complications: No apparent anesthesia complications

## 2019-06-27 NOTE — Interval H&P Note (Signed)
History and Physical Interval Note:  06/27/2019 12:36 PM  Joe Garcia  has presented today for surgery, with the diagnosis of Mass in the transverse colon on the CT scan.  The various methods of treatment have been discussed with the patient and family. After consideration of risks, benefits and other options for treatment, the patient has consented to  Procedure(s): COLONOSCOPY (Left) as a surgical intervention.  The patient's history has been reviewed, patient examined, no change in status, stable for surgery.  I have reviewed the patient's chart and labs.  Questions were answered to the patient's satisfaction.     [place]

## 2019-06-27 NOTE — Progress Notes (Signed)
Pt's stool is now yellow to clear. Pt started on NPO.

## 2019-06-27 NOTE — TOC Initial Note (Signed)
Transition of Care [facility]) - Initial/Assessment Note    Patient Details  Name: Joe Garcia MRN: [id number] Date of Birth: February 02, 1951  [facility] CM/SW Contact:    Marilu Favre, RN Phone Number: 06/27/2019, 11:43 AM  Clinical Narrative:                 Confirmed face sheet information. Patient from home alone but has multiple friends and family members to assist if needed at discharge. Will continue to follow.   Expected Discharge Plan: Home/Self Care Barriers to Discharge: Continued Medical Work up   Patient Goals and CMS Choice Patient states their goals for this hospitalization and ongoing recovery are:: to return home CMS Medicare.gov Compare Post Acute Care list provided to:: Patient Choice offered to / list presented to : NA  Expected Discharge Plan and Services Expected Discharge Plan: Home/Self Care   Discharge Planning Services: CM Consult   Living arrangements for the past 2 months: Apartment                 DME Arranged: N/A         HH Arranged: NA          Prior Living Arrangements/Services Living arrangements for the past 2 months: Apartment Lives with:: Self Patient language and need for interpreter reviewed:: Yes Do you feel safe going back to the place where you live?: Yes      Need for Family Participation in Patient Care: Yes (Comment) Care giver support system in place?: Yes (comment)   Criminal Activity/Legal Involvement Pertinent to Current Situation/Hospitalization: No - Comment as needed  Activities of Daily Living Home Assistive Devices/Equipment: None ADL Screening (condition at time of admission) Patient's cognitive ability adequate to safely complete daily activities?: Yes Is the patient deaf or have difficulty hearing?: No Does the patient have difficulty seeing, even when wearing glasses/contacts?: No Does the patient have difficulty concentrating, remembering, or making decisions?: No Patient able to  express need for assistance with ADLs?: Yes Does the patient have difficulty dressing or bathing?: No Independently performs ADLs?: Yes (appropriate for developmental age) Does the patient have difficulty walking or climbing stairs?: No Weakness of Legs: None Weakness of Arms/Hands: None  Permission Sought/Granted   Permission granted to share information with : No              Emotional Assessment Appearance:: Appears stated age Attitude/Demeanor/Rapport: Engaged Affect (typically observed): Accepting Orientation: : Oriented to Situation, Oriented to  Time, Oriented to Place, Oriented to Self Alcohol / Substance Use: Not Applicable Psych Involvement: No (comment)  Admission diagnosis:  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 Nausea & vomiting 06/26/2019  . Colonic mass 06/26/2019  . COPD GOLD ?  11/24/2018  . Chest pain   . NSTEMI (non-ST elevated myocardial infarction) ([place]) 10/03/2018  . COPD with acute exacerbation ([place]) 07/26/2018  . ARF (acute renal failure) ([place]) 07/26/2018  . Nausea and vomiting 07/26/2018  . Weight loss, unintentional 07/26/2018  . Malnutrition of moderate degree 07/16/2017  . 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 Solitary kidney, congenital 06/23/2017  . Nonischemic cardiomyopathy ([place])   . Multifocal PVCs   . Chronic combined systolic (congestive) and diastolic (congestive) heart failure ([place]) 06/11/2017  . Hypokalemia 05/05/2017  . DOE (dyspnea on exertion) 07/16/2013  . Alcohol  abuse 09/25/2012  . Alcohol dependence ([place]) 09/22/2012  . Essential hypertension 03/22/2007  . DEGENERATIVE DISC DISEASE 03/22/2007  . AVASCULAR NECROSIS 03/22/2007   PCP:  Medicine, [facility]:   [place],  [id number] [place] [id number] Phone: [phone] Fax: [place], [place] [address] Phone: [phone] Fax: [phone]     Social Determinants of Health (SDOH) Interventions    Readmission Risk Interventions No flowsheet data found.

## 2019-06-27 NOTE — Anesthesia Preprocedure Evaluation (Addendum)
Anesthesia Evaluation  Patient identified by MRN, date of birth, ID band Patient awake    Reviewed: Allergy & Precautions, NPO status , Patient's Chart, lab work & pertinent test results, reviewed documented beta blocker date and time   Airway Mallampati: II  TM Distance: >3 FB Neck ROM: Full    Dental no notable dental hx.    Pulmonary shortness of breath and with exertion, asthma , COPD, former smoker,  Uses albuterol inhaler daily   Pulmonary exam normal breath sounds clear to auscultation       Cardiovascular hypertension, Pt. on medications and Pt. on home beta blockers +CHF  Normal cardiovascular exam+ Valvular Problems/Murmurs MR  Rhythm:Regular Rate:Normal  chronic systolic diastolic heart failure, EF 30 to 35%, mild MR(ECHO 10/04/2018)   Neuro/Psych PSYCHIATRIC DISORDERS Depression negative neurological ROS     GI/Hepatic (+)     substance abuse  alcohol use, Obstructing colon mass- CT abdomen/pelvis showed circumferential area of short segment wall thickening within the distal transverse colon, concerning for annular apple core malignancy.   Endo/Other  negative endocrine ROS  Renal/GU CRFRenal diseaseCKD 3, last Cr 1.33  negative genitourinary   Musculoskeletal  (+) Arthritis , Osteoarthritis,  DDD   Abdominal Normal abdominal exam  (+)   Peds  Hematology  (+) anemia , Hct 29.8   Anesthesia Other Findings HLD  p/w nausea/vomitting/abdominal pain ongoing intermittently for the past 5 months, but currently worsening over the past week. Currently has none of these symptoms.  Reproductive/Obstetrics negative OB ROS                            Anesthesia Physical Anesthesia Plan  ASA: III  Anesthesia Plan: MAC   Post-op Pain Management:    Induction:   PONV Risk Score and Plan: 1 and Propofol infusion, TIVA and Treatment may vary due to age or medical condition  Airway  Management Planned: Natural Airway and Simple Face Mask  Additional Equipment: None  Intra-op Plan:   Post-operative Plan:   Informed Consent: I have reviewed the patients History and Physical, chart, labs and discussed the procedure including the risks, benefits and alternatives for the proposed anesthesia with the patient or authorized representative who has indicated his/her understanding and acceptance.       Plan Discussed with: CRNA  Anesthesia Plan Comments:         Anesthesia Quick Evaluation

## 2019-06-27 NOTE — Op Note (Signed)
[facility] Patient Name: Joe Garcia Procedure Date : 06/27/2019 MRN: [id number] Attending MD: Carlota Raspberry. Havery Moros , MD Date of Birth: 1950/11/02 CSN: [id number] Age: 68 Admit Type: Inpatient Procedure:                Colonoscopy Indications:              Abnormal CT of the GI tract, change in bowel habits                            - concern for colon mass Providers:                Remo Lipps P. Havery Moros, MD, Carlyn Reichert, RN, Edwina Barth, CRNA Referring MD:              Medicines:                Monitored Anesthesia Care Complications:            No immediate complications. Estimated blood loss:                            Minimal. Estimated Blood Loss:     Estimated blood loss was minimal. Procedure:                Pre-Anesthesia Assessment:                           - Prior to the procedure, a History and Physical                            was performed, and patient medications and                            allergies were reviewed. The patient's tolerance of                            previous anesthesia was also reviewed. The risks                            and benefits of the procedure and the sedation                            options and risks were discussed with the patient.                            All questions were answered, and informed consent                            was obtained. Prior Anticoagulants: The patient has                            taken no previous anticoagulant or antiplatelet  agents. ASA Grade Assessment: III - A patient with                            severe systemic disease. After reviewing the risks                            and benefits, the patient was deemed in                            satisfactory condition to undergo the procedure.                           After obtaining informed consent, the colonoscope                            was passed under direct vision.  Throughout the                            procedure, the patient's blood pressure, pulse, and                            oxygen saturations were monitored continuously. The                            [id number]) Olympus colonoscope was                            introduced through the anus and advanced to the the                            transverse colon to examine a mass. This was the                            intended extent. The colonoscopy was performed                            without difficulty. The patient tolerated the                            procedure well. The quality of the bowel                            preparation was poor. The rectum was photographed. Scope In: 1:04:29 PM Scope Out: 1:18:07 PM Scope Withdrawal Time: 0 hours 1 minute 42 seconds  Total Procedure Duration: 0 hours 13 minutes 38 seconds  Findings:      The perianal and digital rectal examinations were normal.      A partially obstructing suspected mass lesion was found in the distal       transverse colon, with severe luminal narrowing. The lesion could not be       be traversed and was circumferential. Biopsies were taken with a cold       forceps for histology.      A large amount of semi-liquid stool was found in the entire colon,  making visualization difficult. The prep was inadequate for screening       purpose. No obvious polyps mass lesions noted otherwise however       visualization was poor in certain areas and other polyps may not have       been appreciated.      Internal hemorrhoids were found.      The exam was otherwise without abnormality. Impression:               - Preparation of the colon was poor, exam                            inadequate for ensuring no other polyps noted,                            however none visualized.                           - Likely malignant partially obstructing tumor in                            the distal transverse colon, could not  traverse it                            due to luminal narrowing. Biopsied.                           - Internal hemorrhoids. Recommendation:           - Return patient to hospital ward for ongoing care.                           - Full liquid diet.                           - Continue present medications.                           - Miralax twice daily to keep stools loose, prevent                            obstruction                           - Await pathology results, hopefully back in next                            24-48 hours                           - CT chest to complete staging, CEA level                           - Appreciate surgical team following, will need                            resection, getting cardiac clearance Procedure Code(s):        ---  Professional ---                           [phone], 89, Colonoscopy, flexible; with biopsy,                            single or multiple Diagnosis Code(s):        --- Professional ---                           K64.8, Other hemorrhoids                           D49.0, Neoplasm of unspecified behavior of                            digestive system                           K56.690, Other partial intestinal obstruction                           R93.3, Abnormal findings on diagnostic imaging of                            other parts of digestive tract CPT copyright 2019 American Medical Association. All rights reserved. The codes documented in this report are preliminary and upon coder review may  be revised to meet current compliance requirements. Remo Lipps P. Armbruster, MD 06/27/2019 1:30:01 PM This report has been signed electronically. Number of Addenda: 0

## 2019-06-27 NOTE — Progress Notes (Signed)
PROGRESS NOTE  Joe Garcia [id number] DOB: 1951/05/26 DOA: 06/26/2019 PCP: Medicine, [facility]  HPI/Recap of past 24 hours: HPI from Dr Annamaria Boots  is a 68 y.o. male, w hypertension, hyperlipidemia, CHF, ETOH dependence, depression, presents with c/o nausea/vomitting/abdominal pain ongoing intermittently for the past 5 months, but currently worsening over the past week.  Patient also endorses weight loss, constipation.  Denies any bright red blood per rectum.  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  Patient admitted for further management    Today, patient denies any new complaints, patient has been prepped for colonoscopy.  Assessment/Plan: Principal Problem:   Nausea and vomiting Active Problems:   Hypokalemia   ARF (acute renal failure) (HCC)   Nausea & vomiting   Colonic mass  Likely colon mass/CA CT abdomen/pelvis showed circumferential area of short segment wall thickening within the distal transverse colon, concerning for annular focal malignancy GI consulted, plan for colonoscopy on 06/27/2019 General surgery consulted may need partial colectomy  Iron deficiency anemia Likely due to above Anemia panel showed iron 18, sats 5, ferritin 19 Gave 1 dose of Feraheme on 06/26/2019 Daily p.o. iron supplementation  Hypokalemia Replace as needed  CKD stage III Stable at baseline Daily BMP  Leukocytosis Resolved Afebrile, likely reactive Daily CBC  Chronic systolic/diastolic HF/nonischemic cardiomyopathy/history of PVCs Appears dry Continue Lasix, spironolactone, Bidil, Coreg, losartan  Hypertension Stable Meds as above  Asthma Continue singular, inhalers         Malnutrition Type:      Malnutrition Characteristics:      Nutrition Interventions:        Estimated body mass index is 19.67 kg/m as calculated from the following:   Height as of this encounter: 5\' 11"  (1.803 m).   Weight as of this encounter: 64 kg.     Code Status: Full  Family Communication: None at bedside  Disposition Plan: To be determined   Consultants:  GI  General surgery  Procedures:  Colonoscopy on 06/27/19  Antimicrobials:  None  DVT prophylaxis: Lovenox   Objective: Vitals:   06/26/19 1620 06/26/19 2059 06/27/19 0505 06/27/19 1158  BP: 116/77 112/64 110/66 (!) 141/78  Pulse: 76 61 (!) 57 61  Resp: 17 18 18 15   Temp: 98.9 F (37.2 C) 98.8 F (37.1 C) 98.3 F (36.8 C)   TempSrc: Oral Oral Oral   SpO2: 99% 99% 100% 100%  Weight:    64 kg  Height:    5\' 11"  (1.803 m)    Intake/Output Summary (Last 24 hours) at 06/27/2019 1200 Last data filed at 06/27/2019 0830 Gross per 24 hour  Intake 539.61 ml  Output 850 ml  Net -310.39 ml   Filed Weights   06/26/19 0049 06/27/19 1158  Weight: 64.9 kg 64 kg    Exam:  General: NAD   Cardiovascular: S1, S2 present  Respiratory: CTAB  Abdomen: Soft, nontender, nondistended, bowel sounds present  Musculoskeletal: No bilateral pedal edema noted  Skin: Normal  Psychiatry: Normal mood   Data Reviewed: CBC: Recent Labs  Lab 06/26/19 0055 06/26/19 0624 06/27/19 0227  WBC 16.0* 16.9* 9.3  NEUTROABS  --   --  6.3  HGB 11.5* 11.6* 9.4*  HCT 36.7* 36.5* 29.8*  MCV 78.8* 78.5* 77.8*  PLT 538* 486* [id number]   Basic Metabolic Panel: Recent Labs  Lab 06/26/19 0055 06/26/19 [id number]  06/27/19 0227  NA 141 140 136  K 3.3* 3.5 3.2*  CL 105 103 102  CO2 22 26 23   GLUCOSE 126* 119* 107*  BUN 31* 26* 19  CREATININE 1.58* 1.25* 1.33*  CALCIUM 9.1 8.9 8.3*   GFR: Estimated Creatinine Clearance: 49.5 mL/min (A) (by C-G formula based on SCr of 1.33 mg/dL (H)). Liver Function Tests: Recent Labs  Lab 06/26/19 0055 06/26/19 0624  AST 16 18  ALT 20 21  ALKPHOS 72 76  BILITOT 0.3 0.5   PROT 7.1 6.7  ALBUMIN 3.5 3.4*   Recent Labs  Lab 06/26/19 0055  LIPASE 24   No results for input(s): AMMONIA in the last 168 hours. Coagulation Profile: Recent Labs  Lab 06/26/19 0152  INR 0.9   Cardiac Enzymes: No results for input(s): CKTOTAL, CKMB, CKMBINDEX, TROPONINI in the last 168 hours. BNP (last 3 results) No results for input(s): PROBNP in the last 8760 hours. HbA1C: No results for input(s): HGBA1C in the last 72 hours. CBG: No results for input(s): GLUCAP in the last 168 hours. Lipid Profile: No results for input(s): CHOL, HDL, LDLCALC, TRIG, CHOLHDL, LDLDIRECT in the last 72 hours. Thyroid Function Tests: Recent Labs    06/26/19 0624  TSH 1.823   Anemia Panel: Recent Labs    06/26/19 0624  FERRITIN 19*  TIBC 367  IRON 18*   Urine analysis:    Component Value Date/Time   COLORURINE STRAW (A) 06/26/2019 0051   APPEARANCEUR CLEAR 06/26/2019 0051   LABSPEC 1.025 06/26/2019 0051   PHURINE 6.0 06/26/2019 0051   GLUCOSEU NEGATIVE 06/26/2019 0051   HGBUR NEGATIVE 06/26/2019 0051   BILIRUBINUR NEGATIVE 06/26/2019 0051   KETONESUR NEGATIVE 06/26/2019 0051   PROTEINUR NEGATIVE 06/26/2019 0051   UROBILINOGEN 1.0 07/16/2013 1404   NITRITE NEGATIVE 06/26/2019 0051   LEUKOCYTESUR NEGATIVE 06/26/2019 0051   Sepsis Labs: @LABRCNTIP (procalcitonin:4,lacticidven:4)  ) Recent Results (from the past 240 hour(s))  SARS CORONAVIRUS 2 (TAT 6-24 HRS) Nasopharyngeal Urine, Clean Catch     Status: None   Collection Time: 06/26/19  3:22 AM   Specimen: Urine, Clean Catch; Nasopharyngeal  Result Value Ref Range Status   SARS Coronavirus 2 NEGATIVE NEGATIVE Final    Comment: (NOTE) SARS-CoV-2 target nucleic acids are NOT DETECTED. The SARS-CoV-2 RNA is generally detectable in upper and lower respiratory specimens during the acute phase of infection. Negative results do not preclude SARS-CoV-2 infection, do not rule out co-infections with other pathogens, and should  not be used as the sole basis for treatment or other patient management decisions. Negative results must be combined with clinical observations, patient history, and epidemiological information. The expected result is Negative. Fact Sheet for Patients: SugarRoll.be Fact Sheet for Healthcare Providers: https://www.woods-mathews.com/ This test is not yet approved or cleared by the [place] FDA and  has been authorized for detection and/or diagnosis of SARS-CoV-2 by FDA under an Emergency Use Authorization (EUA). This EUA will remain  in effect (meaning this test can be used) for the duration of the COVID-19 declaration under Section 56 4(b)(1) of the Act, 21 U.S.C. section 360bbb-3(b)(1), unless the authorization is terminated or revoked sooner. Performed at [facility], [place] [address], [place], [place] [address]       Studies: No results found.  Scheduled Meds: . [MAR Hold] amiodarone  100 mg Oral Daily  . [MAR Hold] carvedilol  3.125 mg Oral BID WC  . [MAR Hold] enoxaparin (LOVENOX) injection  40 mg Subcutaneous Q24H  . [facility]  Hold] ferrous sulfate  325 mg Oral Q breakfast  . [MAR Hold] fluticasone  2 spray Each Nare Daily  . [MAR Hold] furosemide  20 mg Oral Daily  . [MAR Hold] isosorbide-hydrALAZINE  0.5 tablet Oral TID  . [MAR Hold] loratadine  10 mg Oral Daily  . [MAR Hold] losartan  25 mg Oral Daily  . [MAR Hold] mometasone-formoterol  2 puff Inhalation BID  . [MAR Hold] montelukast  10 mg Oral QHS  . [MAR Hold] spironolactone  25 mg Oral Daily    Continuous Infusions: . potassium chloride       LOS: 0 days     Alma Friendly, MD [facility]  If 7PM-7AM, please contact night-coverage [email or web address] 06/27/2019, 12:00 PM

## 2019-06-27 NOTE — Progress Notes (Signed)
    CC: Shortness of breath, nausea, vomiting and abdominal pain  Subjective: He is fine he has no complaints this AM.  He is awaiting colonoscopy later today.  He thinks the bowel prep was successful yesterday. Objective: Vital signs in last 24 hours: Temp:  [98.3 F (36.8 C)-98.9 F (37.2 C)] 98.3 F (36.8 C) (11/30 0505) Pulse Rate:  [57-76] 57 (11/30 0505) Resp:  [17-18] 18 (11/30 0505) BP: (110-116)/(57-77) 110/66 (11/30 0505) SpO2:  [99 %-100 %] 100 % (11/30 0505) Last BM Date: 06/25/19 N.p.o. Nothing recorded p.o. yesterday.  539 IV recorded Urine 1300 BM x6 Afebrile vital signs are stable blood pressure better controlled. Creatinine 1.33 WBC 9.3 H/H 9.4/29.8  Intake/Output from previous day: 11/29 0701 - 11/30 0700 In: 539.6 [I.V.:422.6; IV Piggyback:117] Out: 1300 [Urine:1300] Intake/Output this shift: No intake/output data recorded.  General appearance: alert, cooperative and no distress Resp: clear to auscultation bilaterally GI: soft, non-tender; bowel sounds normal; no masses,  no organomegaly  Lab Results:  Recent Labs    06/26/19 0624 06/27/19 0227  WBC 16.9* 9.3  HGB 11.6* 9.4*  HCT 36.5* 29.8*  PLT 486* 325    BMET Recent Labs    06/26/19 0624 06/27/19 0227  NA 140 136  K 3.5 3.2*  CL 103 102  CO2 26 23  GLUCOSE 119* 107*  BUN 26* 19  CREATININE 1.25* 1.33*  CALCIUM 8.9 8.3*   PT/INR Recent Labs    06/26/19 0152  LABPROT 12.3  INR 0.9    Recent Labs  Lab 06/26/19 0055 06/26/19 0624  AST 16 18  ALT 20 21  ALKPHOS 72 76  BILITOT 0.3 0.5  PROT 7.1 6.7  ALBUMIN 3.5 3.4*     Lipase     Component Value Date/Time   LIPASE 24 06/26/2019 0055     Medications: . amiodarone  100 mg Oral Daily  . carvedilol  3.125 mg Oral BID WC  . enoxaparin (LOVENOX) injection  40 mg Subcutaneous Q24H  . ferrous sulfate  325 mg Oral Q breakfast  . fluticasone  2 spray Each Nare Daily  . furosemide  20 mg Oral Daily  .  isosorbide-hydrALAZINE  0.5 tablet Oral TID  . loratadine  10 mg Oral Daily  . losartan  25 mg Oral Daily  . mometasone-formoterol  2 puff Inhalation BID  . montelukast  10 mg Oral QHS  . spironolactone  25 mg Oral Daily   . 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 Asthma on inhalers Hypertension  Obstructing colon mass Colonoscopy scheduled 06/27/2019  FEN: N.p.o. ID: None DVT: Lovenox Follow-up: TBD  Plan: He will need cardiology consult.  Await results of his colonoscopy.  I would keep him on clear liquids after his colonoscopy.  We will follow with you.       LOS: 0 days    Gloria Lambertson 06/27/2019 Please see Amion

## 2019-06-28 ENCOUNTER — Inpatient Hospital Stay (HOSPITAL_COMMUNITY): Payer: Medicare Other

## 2019-06-28 ENCOUNTER — Encounter (HOSPITAL_COMMUNITY): Payer: Self-pay | Admitting: Gastroenterology

## 2019-06-28 DIAGNOSIS — I10 Essential (primary) hypertension: Secondary | ICD-10-CM

## 2019-06-28 DIAGNOSIS — N1831 Chronic kidney disease, stage 3a: Secondary | ICD-10-CM

## 2019-06-28 DIAGNOSIS — C184 Malignant neoplasm of transverse colon: Secondary | ICD-10-CM

## 2019-06-28 DIAGNOSIS — D500 Iron deficiency anemia secondary to blood loss (chronic): Secondary | ICD-10-CM

## 2019-06-28 DIAGNOSIS — R1084 Generalized abdominal pain: Secondary | ICD-10-CM

## 2019-06-28 DIAGNOSIS — I5022 Chronic systolic (congestive) heart failure: Secondary | ICD-10-CM

## 2019-06-28 DIAGNOSIS — K56609 Unspecified intestinal obstruction, unspecified as to partial versus complete obstruction: Secondary | ICD-10-CM

## 2019-06-28 DIAGNOSIS — J449 Chronic obstructive pulmonary disease, unspecified: Secondary | ICD-10-CM

## 2019-06-28 DIAGNOSIS — Z01810 Encounter for preprocedural cardiovascular examination: Secondary | ICD-10-CM

## 2019-06-28 LAB — CBC WITH DIFFERENTIAL/PLATELET
Abs Immature Granulocytes: 0.09 10*3/uL — ABNORMAL HIGH (ref 0.00–0.07)
Basophils Absolute: 0 10*3/uL (ref 0.0–0.1)
Basophils Relative: 0 %
Eosinophils Absolute: 0.1 10*3/uL (ref 0.0–0.5)
Eosinophils Relative: 1 %
HCT: 29.7 % — ABNORMAL LOW (ref 39.0–52.0)
Hemoglobin: 9.4 g/dL — ABNORMAL LOW (ref 13.0–17.0)
Immature Granulocytes: 1 %
Lymphocytes Relative: 14 %
Lymphs Abs: 1.5 10*3/uL (ref 0.7–4.0)
MCH: 24.3 pg — ABNORMAL LOW (ref 26.0–34.0)
MCHC: 31.6 g/dL (ref 30.0–36.0)
MCV: 76.7 fL — ABNORMAL LOW (ref 80.0–100.0)
Monocytes Absolute: 1 10*3/uL (ref 0.1–1.0)
Monocytes Relative: 9 %
Neutro Abs: 8.3 10*3/uL — ABNORMAL HIGH (ref 1.7–7.7)
Neutrophils Relative %: 75 %
Platelets: 319 10*3/uL (ref 150–400)
RBC: 3.87 MIL/uL — ABNORMAL LOW (ref 4.22–5.81)
RDW: 15.8 % — ABNORMAL HIGH (ref 11.5–15.5)
WBC: 11 10*3/uL — ABNORMAL HIGH (ref 4.0–10.5)
nRBC: 0 % (ref 0.0–0.2)

## 2019-06-28 LAB — BASIC METABOLIC PANEL
Anion gap: 9 (ref 5–15)
BUN: 12 mg/dL (ref 8–23)
CO2: 24 mmol/L (ref 22–32)
Calcium: 8.3 mg/dL — ABNORMAL LOW (ref 8.9–10.3)
Chloride: 104 mmol/L (ref 98–111)
Creatinine, Ser: 1.26 mg/dL — ABNORMAL HIGH (ref 0.61–1.24)
GFR calc Af Amer: >60 mL/min (ref >60)
GFR calc non Af Amer: 59 mL/min — ABNORMAL LOW (ref >60)
Glucose, Bld: 91 mg/dL (ref 70–99)
Potassium: 3.7 mmol/L (ref 3.5–5.1)
Sodium: 137 mmol/L (ref 135–145)

## 2019-06-28 LAB — ECHOCARDIOGRAM COMPLETE
Height: 71 in
Weight: 2264.57 oz

## 2019-06-28 LAB — PREALBUMIN: Prealbumin: 17.2 mg/dL — ABNORMAL LOW (ref 18–38)

## 2019-06-28 LAB — CEA: CEA: 1.4 ng/mL (ref 0.0–4.7)

## 2019-06-28 LAB — SURGICAL PCR SCREEN
MRSA, PCR: NEGATIVE
Staphylococcus aureus: NEGATIVE

## 2019-06-28 MED ORDER — MORPHINE SULFATE (PF) 2 MG/ML IV SOLN
2.0000 mg | INTRAVENOUS | Status: DC | PRN
  Administered 2019-06-29 (×3): 2 mg via INTRAVENOUS
  Filled 2019-06-28 (×3): qty 1

## 2019-06-28 MED ORDER — FERROUS SULFATE 325 (65 FE) MG PO TABS
325.0000 mg | ORAL_TABLET | Freq: Two times a day (BID) | ORAL | Status: DC
  Administered 2019-06-29 (×2): 325 mg via ORAL
  Filled 2019-06-28 (×2): qty 1

## 2019-06-28 MED ORDER — IOHEXOL 300 MG/ML  SOLN
75.0000 mL | Freq: Once | INTRAMUSCULAR | Status: AC | PRN
  Administered 2019-06-28: 75 mL via INTRAVENOUS

## 2019-06-28 MED ORDER — OXYCODONE HCL 5 MG PO TABS: 5.0000 mg | ORAL_TABLET | Freq: Four times a day (QID) | ORAL | Status: DC | PRN

## 2019-06-28 NOTE — Plan of Care (Signed)

## 2019-06-28 NOTE — Progress Notes (Signed)
1 Day Post-Op    CC: Shortness of breath, nausea, vomiting and abdominal pain  Subjective: No real complaints this AM.  Awaiting pathology, and cardiology consult.  He is on clear liquids and all he has is bright substitute is bedside.  Objective: Vital signs in last 24 hours: Temp:  [97.6 F (36.4 C)-99.3 F (37.4 C)] 98.7 F (37.1 C) (12/01 0612) Pulse Rate:  [55-75] 75 (12/01 0612) Resp:  [12-18] 18 (11/30 2122) BP: (96-142)/(52-78) 133/67 (12/01 0612) SpO2:  [96 %-100 %] 98 % (12/01 0612) Weight:  [64 kg-64.2 kg] 64.2 kg (12/01 0500) Last BM Date: 06/27/19 560 p.o. Voided x3 No other intake or output recorded Afebrile vital signs are stable BMP stable; creatinine 1.26 WBC 11.0 H/H stable CEA 1.4  Intake/Output from previous day: 11/30 0701 - 12/01 0700 In: 560 [P.O.:560] Out: -  Intake/Output this shift: No intake/output data recorded.  General appearance: alert, cooperative and no distress Resp: clear to auscultation bilaterally GI: soft, non-tender; bowel sounds normal; no masses,  no organomegaly  Lab Results:  Recent Labs    06/27/19 0227 06/28/19 0215  WBC 9.3 11.0*  HGB 9.4* 9.4*  HCT 29.8* 29.7*  PLT 325 319    BMET Recent Labs    06/27/19 0227 06/28/19 0215  NA 136 137  K 3.2* 3.7  CL 102 104  CO2 23 24  GLUCOSE 107* 91  BUN 19 12  CREATININE 1.33* 1.26*  CALCIUM 8.3* 8.3*   PT/INR Recent Labs    06/26/19 0152  LABPROT 12.3  INR 0.9    Recent Labs  Lab 06/26/19 0055 06/26/19 0624  AST 16 18  ALT 20 21  ALKPHOS 72 76  BILITOT 0.3 0.5  PROT 7.1 6.7  ALBUMIN 3.5 3.4*     Lipase     Component Value Date/Time   LIPASE 24 06/26/2019 0055     Medications: . amiodarone  100 mg Oral Daily  . carvedilol  3.125 mg Oral BID WC  . enoxaparin (LOVENOX) injection  40 mg Subcutaneous Q24H  . ferrous sulfate  325 mg Oral Q breakfast  . fluticasone  2 spray Each Nare Daily  . furosemide  20 mg Oral Daily  .  isosorbide-hydrALAZINE  0.5 tablet Oral TID  . loratadine  10 mg Oral Daily  . losartan  25 mg Oral Daily  . mometasone-formoterol  2 puff Inhalation BID  . montelukast  10 mg Oral QHS  . polyethylene glycol  17 g Oral BID  . spironolactone  25 mg Oral Daily    Assessment/Plan Weight loss AKI on CKD stage III Hx chronic systolic diastolic heart failure, EF 30 to 35%, mild MR(ECHO 10/04/2018)  -Cardiology consult pending Anemia EtOH abuse. Asthma on inhalers Hypertension Malnutrition - prealbumin/nutrition consult  Obstructing colon mass Colonoscopy 06/27/2019, Dr. Havery Moros: A partially obstructing suspected mass lesion was found in the distal transverse colon, with severe luminal narrowing. The lesion could not be be traversed and was circumferential. Biopsies were taken bowel prep was not adequate.    -Pathology pending  FEN: N.p.o. ID: None DVT: Lovenox Follow-up: TBD  Plan: We will add a prealbumin to his a.m. labs.  Asked dietitian to see and work on giving him some additional calories and protein.  Start teaching him how to use the incentive spirometer.  Await pathology results.       LOS: 1 day    Joe Garcia 06/28/2019 Please see Amion

## 2019-06-28 NOTE — Progress Notes (Signed)
Daily Rounding Note  06/28/2019, 8:38 AM  LOS: 1 day   SUBJECTIVE:   Chief complaint:  partially obstructing transverse colon mass.     Some abdominal pain.  Small amount of stool.   Tolerating clears.    OBJECTIVE:         Vital signs in last 24 hours:    Temp:  [97.6 F (36.4 C)-99.3 F (37.4 C)] 98.7 F (37.1 C) (12/01 0612) Pulse Rate:  [55-75] 75 (12/01 0612) Resp:  [12-18] 18 (11/30 2122) BP: (96-142)/(52-78) 133/67 (12/01 0612) SpO2:  [96 %-100 %] 98 % (12/01 0612) Weight:  [64 kg-64.2 kg] 64.2 kg (12/01 0500) Last BM Date: 06/27/19 Filed Weights   06/26/19 0049 06/27/19 1158 06/28/19 0500  Weight: 64.9 kg 64 kg 64.2 kg   General: looks well.  Pleasant, calm. Heart: RRR Chest: Clear bilaterally.  No labored breathing or cough. Abdomen: Soft, slight distention.  Not tender.  Bowel sounds active. Extremities: No CCE. Neuro/Psych: Fully alert and oriented.  No gross deficits.  No tremors.  Intake/Output from previous day: 11/30 0701 - 12/01 0700 In: 560 [P.O.:560] Out: -   Intake/Output this shift: No intake/output data recorded.  Lab Results: Recent Labs    06/26/19 0624 06/27/19 0227 06/28/19 0215  WBC 16.9* 9.3 11.0*  HGB 11.6* 9.4* 9.4*  HCT 36.5* 29.8* 29.7*  PLT 486* 325 319   BMET Recent Labs    06/26/19 0624 06/27/19 0227 06/28/19 0215  NA 140 136 137  K 3.5 3.2* 3.7  CL 103 102 104  CO2 26 23 24   GLUCOSE 119* 107* 91  BUN 26* 19 12  CREATININE 1.25* 1.33* 1.26*  CALCIUM 8.9 8.3* 8.3*   LFT Recent Labs    06/26/19 0055 06/26/19 0624  PROT 7.1 6.7  ALBUMIN 3.5 3.4*  AST 16 18  ALT 20 21  ALKPHOS 72 76  BILITOT 0.3 0.5   PT/INR Recent Labs    06/26/19 0152  LABPROT 12.3  INR 0.9   Hepatitis Panel No results for input(s): HEPBSAG, HCVAB, HEPAIGM, HEPBIGM in the last 72 hours.  Studies/Results: No results found.   Scheduled Meds: . amiodarone  100 mg Oral  Daily  . carvedilol  3.125 mg Oral BID WC  . enoxaparin (LOVENOX) injection  40 mg Subcutaneous Q24H  . ferrous sulfate  325 mg Oral Q breakfast  . fluticasone  2 spray Each Nare Daily  . furosemide  20 mg Oral Daily  . isosorbide-hydrALAZINE  0.5 tablet Oral TID  . loratadine  10 mg Oral Daily  . losartan  25 mg Oral Daily  . mometasone-formoterol  2 puff Inhalation BID  . montelukast  10 mg Oral QHS  . polyethylene glycol  17 g Oral BID  . spironolactone  25 mg Oral Daily   Continuous Infusions: PRN Meds:.acetaminophen **OR** acetaminophen, albuterol, HYDROmorphone (DILAUDID) injection, ondansetron (ZOFRAN) IV, pneumococcal 23 valent vaccine, polyethylene glycol   ASSESMENT:   *   Malignant appearing, partially obstructing, non-traversable w scope, colon mass distal transverse colon.  Colonoscopy w bx 11/30 c/w narrowing and colon wall thickening but no adenopathy on CTAP.  CT chest shows stable 3 mm RLL nodule.  *   Internal hemorrhoids.     *    Macrocytic anemia.  IDA w ferritin 19, iron 18.  Feraheme infusion 11/29. Day 2 daily oral iron  *   CHF.  LVEF 30 - 35% at time of [place]  CM in 09/2018.  No sign CAD on Cath 09/2018.     PLAN   *   Wonder if PO iron is wise given already partially obstructed colon?  *  Advised pt that this is likely cancer and he should alert sibs and children (at appropriate age) to undergo screening colonoscopies   *   Gen surgery waiting on path and cards consult/clearance.      Azucena Freed  06/28/2019, 8:38 AM Phone [phone]

## 2019-06-28 NOTE — Progress Notes (Signed)
  Echocardiogram 2D Echocardiogram has been performed.  Joe Garcia 06/28/2019, 3:16 PM

## 2019-06-28 NOTE — Progress Notes (Signed)
Initial Nutrition Assessment  DOCUMENTATION CODES:   Non-severe (moderate) malnutrition in context of chronic illness  INTERVENTION:   -Ensure Enlive po TID, each supplement provides 350 kcal and 20 grams of protein -Magic cup TID with meals, each supplement provides 290 kcal and 9 grams of protein -MVI with minerals daily -RD will follow for diet advancement and adjust supplement regimen as appropriate  NUTRITION DIAGNOSIS:   Moderate Malnutrition related to chronic illness(ETOH dependence, CHF, colon mass with probable colon cancer) as evidenced by energy intake < 75% for > or equal to 1 month, mild fat depletion, moderate fat depletion, mild muscle depletion, moderate muscle depletion.  GOAL:   Patient will meet greater than or equal to 90% of their needs  MONITOR:   PO intake, Supplement acceptance, Labs, Diet advancement, Weight trends, Skin, I & O's  REASON FOR ASSESSMENT:   Consult Assessment of nutrition requirement/status  ASSESSMENT:   Joe Garcia  is a 68 y.o. male, w hypertension, hyperlipidemia, CHF, ETOH dependence, Depression, presents with c/o nausea and vomitting, and  abdominal pain.  Pt states that has had a few similar episodes since 37months ago. Pt states that the n/v and pain were worse tonight and not improving and thus presented to ED.  Pt notes weight loss.  Pt denies fever, chills, cough, cp, palp, sob, diarrhea, brbpr.  Pt admitted with colon mass.   11/30- s/p colonoscopy, revealed likely malignant partially obstructing tumor in distal transverse colon (biopsied)  Reviewed I/O's: +560 ml x 24 hours and -200 ml since admission  Per MD notes, awaiting pathology for possible colon cancer diagnosis.   Case discussed with RN, who reports plan for potential surgery (ex lap with colon resection) tomorrow.   Spoke with pt at bedside, who reports decreased appetite over the past 2-3 months. Pt shares that he often get a "gassy sensation" in his  stomach and also experienced constipation, however, this is relieved if he "drinks 1-2 cartons of milk". Pt reports he consumes 1-2 meals per day at baseline, which usually consists of a frozen dinner (pt will graze on dinner throughout the day). He also consumes 1-2 Boost supplements daily PTA, depending on intake (pt estimates consuming a 12 pack per week). Currently, pt reports he is "starving", however, consumed only grits yesterday and consumed 100% of jello and juice off lunch tray.   Pt reports his UBW is around 158#, which he last weighed a few months ago. Pt estimates he has lost approximately 18# during this time frame, however, this is not consistent with wt hx. Noted pt has experienced a 8.7% wt loss over the past 6 months, which while not significant for time frame is concerning given pt's poor appetite, comorbidities, and possible cancer diagnosis.   Discussed with importance of good meal and supplement intake to promote healing. He is amenable to [employer] and Ensure supplements.   Labs reviewed.   NUTRITION - FOCUSED PHYSICAL EXAM:    Most Recent Value  Orbital Region  Mild depletion  Upper Arm Region  Moderate depletion  Thoracic and Lumbar Region  Mild depletion  Buccal Region  Moderate depletion  Temple Region  Moderate depletion  Clavicle Bone Region  Moderate depletion  Clavicle and Acromion Bone Region  Mild depletion  Scapular Bone Region  Mild depletion  Dorsal Hand  Moderate depletion  Patellar Region  Mild depletion  Anterior Thigh Region  Mild depletion  Posterior Calf Region  Mild depletion  Edema (RD Assessment)  Mild  Hair  Reviewed  Eyes  Reviewed  Mouth  Reviewed  Skin  Reviewed  Nails  Reviewed       Diet Order:   Diet Order            Diet full liquid Room service appropriate? Yes; Fluid consistency: Thin  Diet effective now              EDUCATION NEEDS:   Education needs have been addressed  Skin:  Skin Assessment: Reviewed RN  Assessment  Last BM:  06/26/29  Height:   Ht Readings from Last 1 Encounters:  06/27/19 5\' 11"  (1.803 m)    Weight:   Wt Readings from Last 1 Encounters:  06/28/19 64.2 kg    Ideal Body Weight:  78.2 kg  BMI:  Body mass index is 19.74 kg/m.  Estimated Nutritional Needs:   Kcal:  1950-2150  Protein:  100-115 grams  Fluid:  > 1.9 L    Ramari Bray A. Jimmye Norman, RD, LDN, [place] Registered Dietitian II Certified Diabetes Care and Education Specialist Pager: [phone] After hours Pager: [phone]

## 2019-06-28 NOTE — Consult Note (Addendum)
Advanced Heart Failure Team Consult Note   Primary Physician: Medicine, [facility] PCP-Cardiologist:  Dr. [name]   Reason for Consultation: Preop evaluation / surgical clearance prior to colectomy for colon mass   HPI:    Joe Garcia is seen today for evaluation of preop evaluation / surgical clearance prior to colectomy for colon mass, at the request of [place].  Joe Garcia is a 68 y.o. 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 Former crack cocaine abuse.   Admitted 11/14 - 06/15/17 with CP and DOE. Received steroids and nebs. BNP minimally elevated on arrival. Troponin negative. R/LHC with normal cors and compensated hemodynamics. Echo with EF 30-35%. 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   Seen in ED 09/09/17 with N/V and "chest pain". CP felt like his normal reflux. EKG with PVCs and NSR. Had been seen at home by paramedicine and told HR was in 30s, but normally in 60s on monitor. Likely undercounting due to PVCs. Work up unremarkable.   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   Readmitted 10/03/18 with chest pain. Cardiac enzymes minimally elevated but with EKG changes he was set up for LHC. 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 ECHO completed and showed EF 30-35% and normal RV function.   Presented to [facility] ED 06/26/19 w/ CC of abdominal pain, n/v. CT of abdomen and pelvis showed an abnormal segment of colon in the distal transverse colon with marked wall thickening. Colon proximal to this is dilated. 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 He was admitted by IM. Zofran given for nausea. Found to be hypokalemic and w/ AKI, likely 2/2 vomiting. SCr 1.65 and K 3.3. K supplmented and IVFs given for hydration. Today, SCr down to 1.26. K normal at 3.7.   GI consulted for colonoscopy.  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 Biopsy taken. Pathology pending. CEA normal at 1.4. General surgery consulted for possible colectomy. AHF team asked to evaluate preop for surgical risk assessment/clearance.   He has been doing fairly well from a heart failure standpoint. He notes chronic exertional dyspnea w/ mild-moderate activity but no change in baseline. He attributes most of his dyspnea to his asthma. No resting dyspnea. No weight gain. Has had progressive wt loss last few months, close to 15 lb. No LEE, orthopnea or PND. Denies CP. Has been compliant w/ home HF regimen. No recent tobacco use. Tele shows NSR. No PVCs.    Echo 3/20 EF 30-35%. Normal RV. No significant valvular abnormalities.   LHC 3/20 - normal coronaries.   Review of Systems: [y] = yes, [ ]  = no   . General: Weight gain [ ] ; Weight loss [ ] ; Anorexia [ ] ; Fatigue [ ] ; Fever [ ] ; Chills [ ] ; Weakness [ ]   . Cardiac: Chest pain/pressure [ ] ; Resting SOB [ ] ; Exertional SOB [ ] ; Orthopnea [ ] ; Pedal Edema [ ] ; Palpitations [ ] ; Syncope [ ] ; Presyncope [ ] ; Paroxysmal nocturnal dyspnea[ ]   . Pulmonary: Cough [ ] ; Wheezing[ ] ; Hemoptysis[ ] ; Sputum [ ] ; Snoring [ ]   . GI: Vomiting[ ] ; Dysphagia[ ] ; Melena[ ] ; Hematochezia [ ] ; Heartburn[ ] ; Abdominal pain [ ] ; Constipation [ ] ; Diarrhea [ ] ; BRBPR [ ]   . GU:  Hematuria[ ] ; Dysuria [ ] ; Nocturia[ ]   . Vascular: Pain in legs with walking [ ] ; Pain in feet with lying flat [ ] ; Non-healing sores [ ] ; Stroke [ ] ; TIA [ ] ; Slurred speech [ ] ;  . Neuro: Headaches[ ] ; Vertigo[ ] ; Seizures[ ] ; Paresthesias[ ] ;Blurred vision [ ] ; Diplopia [ ] ; Vision changes [ ]   . Ortho/Skin: Arthritis [ ] ; Joint pain [ ] ; Muscle pain [ ] ; Joint swelling [ ] ; Back Pain [ ] ; Rash [ ]   . Psych: Depression[ ] ; Anxiety[ ]   . Heme: Bleeding problems [ ] ; Clotting disorders [ ] ; Anemia [ ]   . Endocrine:  Diabetes [ ] ; Thyroid dysfunction[ ]   Home Medications Prior to Admission medications   Medication Sig Start Date End Date Taking? Authorizing Provider  acetaminophen (TYLENOL) 500 MG tablet Take 2 tablets (1,000 mg total) by mouth 3 (three) times daily as needed for headache (pain). 07/28/18  Yes Dhungel, Nishant, MD  albuterol (PROVENTIL) (2.5 MG/3ML) 0.083% nebulizer solution Take 3 mLs (2.5 mg total) by nebulization every 6 (six) hours as needed for wheezing or shortness of breath. 06/20/19  Yes Bast, Traci A, NP  albuterol (VENTOLIN HFA) 108 (90 Base) MCG/ACT inhaler Inhale 1-2 puffs into the lungs every 4 (four) hours as needed for wheezing or shortness of breath. 01/29/19  Yes Hall-Potvin, [place], PA-C  amLODipine (NORVASC) 5 MG tablet Take 5 mg by mouth daily.   Yes [provider]  budesonide-formoterol (SYMBICORT) 160-4.5 MCG/ACT inhaler Inhale 2 puffs into the lungs 2 (two) times daily. 11/23/18  Yes Tanda Rockers, MD  carvedilol (COREG) 3.125 MG tablet Take 1 tablet (3.125 mg total) by mouth 2 (two) times daily with a meal. 04/15/19  Yes Kele Barthelemy, Shaune Pascal, MD  fluticasone (FLONASE) 50 MCG/ACT nasal spray Place 2 sprays into both nostrils daily. 02/23/18  Yes Melynda Ripple, MD  furosemide (LASIX) 20 MG tablet Take 1 tablet (20 mg total) by mouth daily. 12/09/18  Yes Clegg, Amy D, NP  isosorbide-hydrALAZINE (BIDIL) 20-37.5 MG tablet Take 0.5 tablets by mouth 3 (three) times daily.    Yes [provider]  loratadine (CLARITIN) 10 MG tablet Take 10 mg by mouth daily.   Yes [provider]  losartan (COZAAR) 25 MG tablet TAKE 1 TABLET BY MOUTH EVERY DAY Patient taking differently: Take 25 mg by mouth daily.  05/30/19  Yes Yarelly Kuba, Shaune Pascal, MD  montelukast (SINGULAIR) 10 MG tablet Take 10 mg by mouth at bedtime.   Yes [provider]  polyethylene glycol powder (GLYCOLAX/MIRALAX) powder Take 17 g by mouth daily as needed for mild constipation.   Yes  [provider]  spironolactone (ALDACTONE) 25 MG tablet Take 1 tablet (25 mg total) by mouth daily. 06/01/19 08/30/19 Yes Clegg, Amy D, NP  amiodarone (PACERONE) 100 MG tablet Take 1 tablet (100 mg total) by mouth daily. 06/27/19   Larey Dresser, MD    Past Medical History: Past Medical History:  Diagnosis Date  . Alcoholism ([place])   . Arthritis    hips, L shoulder  . Asthma   . CHF (congestive heart failure) ([place])   . Depression   . Family history of anesthesia complication   . Hyperlipidemia   . Hypertension     Past Surgical History: Past Surgical History:  Procedure Laterality Date  . LEFT HEART CATH AND CORONARY ANGIOGRAPHY N/A 10/04/2018   Procedure: LEFT HEART CATH AND CORONARY ANGIOGRAPHY;  Surgeon: Jettie Booze, MD;  Location: [facility];  Service: Cardiovascular;  Laterality: N/A;  . RIGHT/LEFT HEART CATH AND CORONARY ANGIOGRAPHY N/A 06/12/2017   Procedure: RIGHT/LEFT HEART CATH AND CORONARY ANGIOGRAPHY;  Surgeon: Jolaine Artist, MD;  Location: [facility];  Service: Cardiovascular;  Laterality: N/A;    Family History: Family History  Problem Relation Age of Onset  . Heart disease Father   . Heart disease Daughter        "fluid around heart"   . [place] disease Daughter   . Cancer Daughter        unsure of type  . Asthma Son   . Heart failure Mother   . Kidney failure Mother     Social History: Social History   Socioeconomic History  . Marital status: Single    Spouse name: Not on file  . Number of children: Not on file  . Years of education: Not on file  . Highest education level: Not on file  Occupational History  . Occupation: "it don't work for me", on disability  Social Needs  . Financial resource strain: Not on file  . Food insecurity    Worry: Not on file    Inability: Not on file  . Transportation needs    Medical: Not on file    Non-medical: Not on file  Tobacco Use  . Smoking status: Former Smoker     Packs/day: 0.10    Years: 45.00    Pack years: 4.50    Quit date: 2012    Years since quitting: 8.9  . Smokeless tobacco: Never Used  Substance and Sexual Activity  . Alcohol use: No    Frequency: Never    Comment: Pt states no etoh since April 2014  . Drug use: No    Comment: Prior use of crack cocaine, quit 2012  . Sexual activity: Never  Lifestyle  . Physical activity    Days per week: Not on file    Minutes per session: Not on file  . Stress: Not on file  Relationships  . Social Herbalist on phone: Not on file    Gets together: Not on file    Attends religious service: Not on file    Active member of club or organization: Not on file    Attends meetings of clubs or organizations: Not on file    Relationship status: Not on file  Other Topics Concern  . Not on file  Social History Narrative  . Not on file    Allergies:  Allergies  Allergen Reactions  . Lisinopril Swelling    Facial and tongue swelling 10/2012    Objective:    Vital Signs:   Temp:  [97.6 F (36.4 C)-99.3 F (37.4 C)] 98.7 F (37.1 C) (12/01 0612) Pulse Rate:  [55-75] 75 (12/01 0612) Resp:  [12-18] 18 (11/30 2122) BP: (96-142)/(52-78) 133/67 (12/01 0612) SpO2:  [96 %-100 %] 98 % (12/01 0612) Weight:  [64 kg-64.2 kg] 64.2 kg (12/01 0500) Last BM Date: 06/27/19  Weight change: Filed Weights   06/26/19 0049 06/27/19 1158 06/28/19 0500  Weight: 64.9 kg 64 kg 64.2 kg    Intake/Output:   Intake/Output Summary (Last 24 hours) at 06/28/2019 1005 Last data filed at 06/27/2019 1830 Gross per 24 hour  Intake 560 ml  Output -  Net 560 ml      Physical Exam    General:  Thin appearing middle aged AAM. No resp difficulty HEENT: normal Neck: supple. JVP . Carotids 2+ bilat;  no bruits. No lymphadenopathy or thyromegaly appreciated. Cor: PMI nondisplaced. Regular rate & rhythm. No rubs, gallops or murmurs. Lungs: clear Abdomen: soft, nontender, nondistended. No hepatosplenomegaly.  No bruits or masses. Good bowel sounds. Extremities: no cyanosis, clubbing, rash, edema Neuro: alert & orientedx3, cranial nerves grossly intact. moves all 4 extremities w/o difficulty. Affect pleasant   Telemetry   NSR. No PVCs   EKG    NSR 73 bpm. No ischemic abnormalities. No PVCs.   Labs   Basic Metabolic Panel: Recent Labs  Lab 06/26/19 0055 06/26/19 0624 06/27/19 0227 06/28/19 0215  NA 141 140 136 137  K 3.3* 3.5 3.2* 3.7  CL 105 103 102 104  CO2 22 26 23 24   GLUCOSE 126* 119* 107* 91  BUN 31* 26* 19 12  CREATININE 1.58* 1.25* 1.33* 1.26*  CALCIUM 9.1 8.9 8.3* 8.3*    Liver Function Tests: Recent Labs  Lab 06/26/19 0055 06/26/19 0624  AST 16 18  ALT 20 21  ALKPHOS 72 76  BILITOT 0.3 0.5  PROT 7.1 6.7  ALBUMIN 3.5 3.4*   Recent Labs  Lab 06/26/19 0055  LIPASE 24   No results for input(s): AMMONIA in the last 168 hours.  CBC: Recent Labs  Lab 06/26/19 0055 06/26/19 0624 06/27/19 0227 06/28/19 0215  WBC 16.0* 16.9* 9.3 11.0*  NEUTROABS  --   --  6.3 8.3*  HGB 11.5* 11.6* 9.4* 9.4*  HCT 36.7* 36.5* 29.8* 29.7*  MCV 78.8* 78.5* 77.8* 76.7*  PLT 538* 486* 325 319    Cardiac Enzymes: No results for input(s): CKTOTAL, CKMB, CKMBINDEX, TROPONINI in the last 168 hours.  BNP: BNP (last 3 results) Recent Labs    07/14/18 1322 10/03/18 0811 11/24/18 2355  BNP 236.6* 106.5* 146.5*    ProBNP (last 3 results) No results for input(s): PROBNP in the last 8760 hours.   CBG: No results for input(s): GLUCAP in the last 168 hours.  Coagulation Studies: Recent Labs    06/26/19 0152  LABPROT 12.3  INR 0.9     Imaging   Ct Chest W Contrast  Result Date: 06/28/2019 CLINICAL DATA:  Preop. Known colon mass. Incomplete colonoscopy. EXAM: CT CHEST WITH CONTRAST TECHNIQUE: Multidetector CT imaging of the chest was performed during intravenous contrast administration. CONTRAST:  46mL OMNIPAQUE IOHEXOL 300 MG/ML  SOLN COMPARISON:  10/03/2018  FINDINGS: Cardiovascular: The heart size appears within normal limits. No pericardial effusion identified. Aortic atherosclerosis. Mediastinum/Nodes: No enlarged mediastinal, hilar, or axillary lymph nodes. Thyroid gland, trachea, and esophagus demonstrate no significant findings. Lungs/Pleura: No pleural effusion identified. No airspace consolidation, atelectasis or pneumothorax identified. 3 mm nodule in the posteromedial right lower lobe is identified, image 74/3. Unchanged. Upper Abdomen: No acute abnormality identified. Scarring involving left kidney is again identified. Chronic right renal atrophy. Musculoskeletal: No chest wall abnormality. No acute or significant osseous findings. IMPRESSION: 1. No active cardiopulmonary abnormalities. No specific findings identified to suggest metastatic disease to the chest. 2. Unchanged 3 mm posteromedial right lower lobe lung nodule. 3. Aortic atherosclerosis. Aortic Atherosclerosis (ICD10-I70.0). Electronically Signed   By: Kerby Moors M.D.   On: 06/28/2019 09:53      Medications:     Current Medications: . amiodarone  100 mg Oral Daily  . carvedilol  3.125 mg Oral BID WC  . enoxaparin (LOVENOX) injection  40 mg Subcutaneous Q24H  . ferrous sulfate  325 mg Oral Q breakfast  . fluticasone  2 spray Each Nare Daily  . furosemide  20 mg  Oral Daily  . isosorbide-hydrALAZINE  0.5 tablet Oral TID  . loratadine  10 mg Oral Daily  . losartan  25 mg Oral Daily  . mometasone-formoterol  2 puff Inhalation BID  . montelukast  10 mg Oral QHS  . polyethylene glycol  17 g Oral BID  . spironolactone  25 mg Oral Daily     Infusions:     Patient Profile   68 y.o. 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 PVC cardiomyopathy), normal coronaries by cath 09/2018, HTN, HLD, asthma, solitary kidney and prior substance abuse w/ new diagnosis of partially obstructive colon mass.   Assessment/Plan   1. Colon Mass: partially obstructive.  Left transverse colon. Pathology pending but suspect malignant. Anticipate colectomy.   2. 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 Most recent echo 3/20 showed EF 30-35%. LHC 3/20 showed normal coronaries.  -Stable NYHA Class II-III symptoms.  -Euvolemic on exam. BP stable.  -Continue home regimen - Losartan 25 mg (not on Entresto due to h/o angioedema w/ lisinopril) - Coreg 3.125 mg bid - Bidil 1/2 tablet tid - Spiro 25 mg  - Lasix 20 mg daily  - Judicious use of IVFs during the perioperative period. Close monitoring of volume status w/ daily wts and strict I/Os  3. H/o PVCs: previous high burden, felt to be primary cause of CM. PVCs suppressed w/ amiodarone. No frequent ectopy noted on tele.   4. Preoperative CV Examination: no CAD. Recent LHC 09/2018 showed normal coronaries. Has chronic systolic HF per above, but appears well compensated. Given severely reduced LV dysfunction, he is at higher risk for perioperative complications but surgery not prohibitive, given benefit outweighs risk.  - Can proceed w/ surgery.  - Recommend continuation of  blocker during the perioperative period.  - Judicious use of IVFs during the perioperative period as noted above.  - We will follow closely along w/ you.     Length of Stay: [place], PA-C  06/28/2019, 10:05 AM  Advanced Heart Failure Team Pager [phone] (M-F; 7a - 4p)  Please contact [facility] for night-coverage after hours (4p -7a ) and weekends on [email or web address]  Patient seen and examined with the above-signed Advanced Practice Provider and/or Housestaff. I personally reviewed laboratory data, imaging studies and relevant notes. I independently examined the patient and formulated the important aspects of the plan. I have edited the note to reflect any of my changes or salient points. I have personally discussed the plan with the patient and/or family.  68 y/o male with systolic HF due to NICM.  (Cath in 3/20 normal coronaries EF 30-35%). Felt to be related to frequent PVCs. PVCs now suppressed with amio and he has been doing very well.   Admitted with obstructive GO symptoms and found to have t-colon mass. Pending resection tomorrow. Active at baseline without significant cardiac symptoms   General:  Well appearing. No resp difficulty HEENT: normal Neck: supple. JVP 7 Carotids 2+ bilat; no bruits. No lymphadenopathy or thryomegaly appreciated. Cor: PMI nondisplaced. Regular rate & rhythm. No rubs, gallops or murmurs. Lungs: clear Abdomen: soft, nontender, nondistended. No hepatosplenomegaly. No bruits or masses. Good bowel sounds. Extremities: no cyanosis, clubbing, rash, edema Neuro: alert & orientedx3, cranial nerves grossly intact. moves all 4 extremities w/o difficulty. Affect pleasant  He is well-compensated from cardiac perspective and suspect EF likely improved with PVC suppression. He is low risk for peri-op cardiac complications and can proceed  with surgery tomorrow from our perspective. We will repeat echo today to reassess EF pre-operatively.   We will follow.   Glori Bickers, MD  2:23 PM

## 2019-06-28 NOTE — Progress Notes (Signed)
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  In ED, HDS.  CT abdomen and pelvis revealed circumferential wall thickening within the distal transverse colon concerning for annular apple core malignancy.  GI consulted.  Had colonoscopy on 06/27/2019 concerning for malignant partially obstructing tumor in the distal transverse colon.  Biopsy obtained.  Per GI, pathology consistent with adenocarcinoma.  General surgery consulted. Plan is for exlaparotomy and colon resection 06/29/2019.  Has been cleared by advanced heart failure team for surgery.  Subjective: No major events overnight of this morning.  No major complaints.  Continues to endorse diffuse abdominal pain that he describes as gas pain.  Denies chest pain, dyspnea, orthopnea or PND.  Denies melena, hematochezia or family history of colon cancer.  Objective: Vitals:   06/27/19 2122 06/28/19 0500 06/28/19 0612 06/28/19 1541  BP: (!) 100/52  133/67 (!) 115/57  Pulse: 68  75 67  Resp: 18   18  Temp: 99.3 F (37.4 C)  98.7 F (37.1 C) 98.6 F (37 C)  TempSrc: Oral  Oral Oral  SpO2: 96%  98% 100%  Weight:  64.2 kg    Height:        Intake/Output Summary (Last 24 hours) at 06/28/2019 1623 Last data filed at 06/28/2019 1307 Gross per 24 hour  Intake 1000 ml  Output -  Net 1000 ml   Filed Weights   06/26/19 0049 06/27/19 1158 06/28/19 0500  Weight: 64.9 kg 64 kg 64.2 kg    Examination:  GENERAL: No acute distress.  Appears well.  HEENT: MMM.  Vision and  hearing grossly intact.  NECK: Supple.  No apparent JVD.  RESP:  No IWOB. Good air movement bilaterally. CVS:  RRR. Heart sounds normal.  ABD/GI/GU: Bowel sounds present. Soft. Non tender.  MSK/EXT:  No apparent deformity or edema. Moves extremities. SKIN: no apparent skin lesion or wound NEURO: Awake, alert and oriented appropriately.  No gross deficit.  PSYCH: Calm. Normal affect.  Assessment & Plan: Adenocarcinoma of transverse colon: Confirmed on colonoscopy and pathology. -Plan for exlaparotomy and colon resection 06/29/2019.   -Echo basically normal now (previously 30 to 35%) -cleared by advanced HF team for surgery-judicious IV fluid and continuing BB  -Continue bowel regimen-MiraLAX  Intermittent nausea/vomiting/abdominal pain: Likely due to the above. -Management as above -Antiemetics as needed  Chronic systolic CHF now with normal EF: Echo with EF of 60 to 65% (previously 30 to 35%).  No cardiopulmonary symptoms.  Appears euvolemic. -Continue low-dose losartan, Coreg, BiDil, spironolactone and Lasix per cardiology -Monitor fluid status, renal function and electrolytes.  History of PVC: NSR now. -Continue low-dose amiodarone  CKD-3a: Stable -Continue monitoring  Iron deficiency anemia: Iron saturation 5%. -Hgb 12-13 (baseline)> 11.5 (admit)> 9.4 -Received IV Feraheme on 11/29. -P.o. ferrous sulfate  Leukocytosis/bandemia: Suspect demargination.  No obvious signs of infection. -Continue trending  Essential hypertension: Normotensive. -Cardiac meds as above  Chronic asthma/COPD: Stable.  -Continue albuterol and Dulera   Nutrition Problem: Moderate Malnutrition Etiology: chronic illness(ETOH dependence, CHF, colon mass with probable colon cancer)  Signs/Symptoms:  energy intake < 75% for > or equal to 1 month, mild fat depletion, moderate fat depletion, mild muscle depletion, moderate muscle depletion  Interventions: Ensure Enlive (each supplement provides  350kcal and 20 grams of protein), MVI, Magic cup   DVT prophylaxis: Subcu Lovenox Code Status: Full code Family Communication: Patient and/or RN. Available if any question. Disposition Plan: Remains inpatient Consultants: GI (signed off), general surgery, advanced HF  Procedures:  11/30-colonoscopy concerning for distal transverse colon cancer  Microbiology summarized: COVID-19 screen negative.  Sch Meds:  Scheduled Meds: . amiodarone  100 mg Oral Daily  . carvedilol  3.125 mg Oral BID WC  . enoxaparin (LOVENOX) injection  40 mg Subcutaneous Q24H  . ferrous sulfate  325 mg Oral Q breakfast  . fluticasone  2 spray Each Nare Daily  . furosemide  20 mg Oral Daily  . isosorbide-hydrALAZINE  0.5 tablet Oral TID  . loratadine  10 mg Oral Daily  . losartan  25 mg Oral Daily  . mometasone-formoterol  2 puff Inhalation BID  . montelukast  10 mg Oral QHS  . polyethylene glycol  17 g Oral BID  . spironolactone  25 mg Oral Daily   Continuous Infusions: PRN Meds:.acetaminophen **OR** acetaminophen, albuterol, ondansetron (ZOFRAN) IV, pneumococcal 23 valent vaccine, polyethylene glycol  Antimicrobials: Anti-infectives (From admission, onward)   None       I have personally reviewed the following labs and images: CBC: Recent Labs  Lab 06/26/19 0055 06/26/19 0624 06/27/19 0227 06/28/19 0215  WBC 16.0* 16.9* 9.3 11.0*  NEUTROABS  --   --  6.3 8.3*  HGB 11.5* 11.6* 9.4* 9.4*  HCT 36.7* 36.5* 29.8* 29.7*  MCV 78.8* 78.5* 77.8* 76.7*  PLT 538* 486* 325 319   BMP &GFR Recent Labs  Lab 06/26/19 0055 06/26/19 0624 06/27/19 0227 06/28/19 0215  NA 141 140 136 137  K 3.3* 3.5 3.2* 3.7  CL 105 103 102 104  CO2 22 26 23 24   GLUCOSE 126* 119* 107* 91  BUN 31* 26* 19 12  CREATININE 1.58* 1.25* 1.33* 1.26*  CALCIUM 9.1 8.9 8.3* 8.3*   Estimated Creatinine Clearance: 52.4 mL/min (A) (by C-G formula based on SCr of 1.26 mg/dL (H)). Liver & Pancreas: Recent Labs  Lab 06/26/19  0055 06/26/19 0624  AST 16 18  ALT 20 21  ALKPHOS 72 76  BILITOT 0.3 0.5  PROT 7.1 6.7  ALBUMIN 3.5 3.4*   Recent Labs  Lab 06/26/19 0055  LIPASE 24   No results for input(s): AMMONIA in the last 168 hours. Diabetic: No results for input(s): HGBA1C in the last 72 hours. No results for input(s): GLUCAP in the last 168 hours. Cardiac Enzymes: No results for input(s): CKTOTAL, CKMB, CKMBINDEX, TROPONINI in the last 168 hours. No results for input(s): PROBNP in the last 8760 hours. Coagulation Profile: Recent Labs  Lab 06/26/19 0152  INR 0.9   Thyroid Function Tests: Recent Labs    06/26/19 0624  TSH 1.823   Lipid Profile: No results for input(s): CHOL, HDL, LDLCALC, TRIG, CHOLHDL, LDLDIRECT in the last 72 hours. Anemia Panel: Recent Labs    06/26/19 0624  FERRITIN 19*  TIBC 367  IRON 18*   Urine analysis:    Component Value Date/Time   COLORURINE STRAW (A) 06/26/2019 0051   APPEARANCEUR CLEAR 06/26/2019 0051   LABSPEC 1.025 06/26/2019 0051   PHURINE 6.0 06/26/2019 0051   GLUCOSEU NEGATIVE 06/26/2019 0051   HGBUR NEGATIVE 06/26/2019 0051   [place] NEGATIVE 06/26/2019  [place] 06/26/2019 [place] 06/26/2019 0051   UROBILINOGEN 1.0 07/16/2013 1404   NITRITE NEGATIVE 06/26/2019 [place] 06/26/2019 0051   Sepsis Labs: Invalid input(s): PROCALCITONIN, [place]  Microbiology: Recent Results (from the past 240 hour(s))  SARS CORONAVIRUS 2 (TAT 6-24 HRS) Nasopharyngeal Urine, Clean Catch     Status: None   Collection Time: 06/26/19  3:22 AM   Specimen: Urine, Clean Catch; Nasopharyngeal  Result Value Ref Range Status   SARS Coronavirus 2 NEGATIVE NEGATIVE Final    Comment: (NOTE) SARS-CoV-2 target nucleic acids are NOT DETECTED. The SARS-CoV-2 RNA is generally detectable in upper and lower respiratory specimens during the acute phase of infection. Negative results do not preclude SARS-CoV-2  infection, do not rule out co-infections with other pathogens, and should not be used as the sole basis for treatment or other patient management decisions. Negative results must be combined with clinical observations, patient history, and epidemiological information. The expected result is Negative. Fact Sheet for Patients: SugarRoll.be Fact Sheet for Healthcare Providers: https://www.woods-mathews.com/ This test is not yet approved or cleared by the [place] FDA and  has been authorized for detection and/or diagnosis of SARS-CoV-2 by FDA under an Emergency Use Authorization (EUA). This EUA will remain  in effect (meaning this test can be used) for the duration of the COVID-19 declaration under Section 56 4(b)(1) of the Act, 21 U.S.C. section 360bbb-3(b)(1), unless the authorization is terminated or revoked sooner. Performed at [facility], [place] [address] [place], [place] [address]     Radiology Studies: Ct Chest W Contrast  Result Date: 06/28/2019 CLINICAL DATA:  Preop. Known colon mass. Incomplete colonoscopy. EXAM: CT CHEST WITH CONTRAST TECHNIQUE: Multidetector CT imaging of the chest was performed during intravenous contrast administration. CONTRAST:  24mL OMNIPAQUE IOHEXOL 300 MG/ML  SOLN COMPARISON:  10/03/2018 FINDINGS: Cardiovascular: The heart size appears within normal limits. No pericardial effusion identified. Aortic atherosclerosis. Mediastinum/Nodes: No enlarged mediastinal, hilar, or axillary lymph nodes. Thyroid gland, trachea, and esophagus demonstrate no significant findings. Lungs/Pleura: No pleural effusion identified. No airspace consolidation, atelectasis or pneumothorax identified. 3 mm nodule in the posteromedial right lower lobe is identified, image 74/3. Unchanged. Upper Abdomen: No acute abnormality identified. Scarring involving left kidney is again identified. Chronic right renal atrophy. Musculoskeletal: No  chest wall abnormality. No acute or significant osseous findings. IMPRESSION: 1. No active cardiopulmonary abnormalities. No specific findings identified to suggest metastatic disease to the chest. 2. Unchanged 3 mm posteromedial right lower lobe lung nodule. 3. Aortic atherosclerosis. Aortic Atherosclerosis (ICD10-I70.0). Electronically Signed   By: Kerby Moors M.D.   On: 06/28/2019 09:53    35 minutes with more than 50% spent in reviewing records, counseling patient/family and coordinating care.  Magnus Crescenzo T. [place]  If 7PM-7AM, please contact night-coverage [email or web address] Password [facility] 06/28/2019, 4:23 PM

## 2019-06-29 ENCOUNTER — Inpatient Hospital Stay (HOSPITAL_COMMUNITY): Admission: RE | Admit: 2019-06-29 | Payer: Medicare Other | Source: Ambulatory Visit | Admitting: Internal Medicine

## 2019-06-29 LAB — CBC WITH DIFFERENTIAL/PLATELET
Abs Immature Granulocytes: 0.17 10*3/uL — ABNORMAL HIGH (ref 0.00–0.07)
Basophils Absolute: 0 10*3/uL (ref 0.0–0.1)
Basophils Relative: 0 %
Eosinophils Absolute: 0.1 10*3/uL (ref 0.0–0.5)
Eosinophils Relative: 0 %
HCT: 29.7 % — ABNORMAL LOW (ref 39.0–52.0)
Hemoglobin: 9.7 g/dL — ABNORMAL LOW (ref 13.0–17.0)
Immature Granulocytes: 1 %
Lymphocytes Relative: 11 %
Lymphs Abs: 1.8 10*3/uL (ref 0.7–4.0)
MCH: 25.1 pg — ABNORMAL LOW (ref 26.0–34.0)
MCHC: 32.7 g/dL (ref 30.0–36.0)
MCV: 76.9 fL — ABNORMAL LOW (ref 80.0–100.0)
Monocytes Absolute: 1.8 10*3/uL — ABNORMAL HIGH (ref 0.1–1.0)
Monocytes Relative: 11 %
Neutro Abs: 13.1 10*3/uL — ABNORMAL HIGH (ref 1.7–7.7)
Neutrophils Relative %: 77 %
Platelets: 293 10*3/uL (ref 150–400)
RBC: 3.86 MIL/uL — ABNORMAL LOW (ref 4.22–5.81)
RDW: 15.8 % — ABNORMAL HIGH (ref 11.5–15.5)
WBC: 16.9 10*3/uL — ABNORMAL HIGH (ref 4.0–10.5)
nRBC: 0 % (ref 0.0–0.2)

## 2019-06-29 LAB — BASIC METABOLIC PANEL
Anion gap: 12 (ref 5–15)
BUN: 14 mg/dL (ref 8–23)
CO2: 23 mmol/L (ref 22–32)
Calcium: 8.3 mg/dL — ABNORMAL LOW (ref 8.9–10.3)
Chloride: 101 mmol/L (ref 98–111)
Creatinine, Ser: 1.58 mg/dL — ABNORMAL HIGH (ref 0.61–1.24)
GFR calc Af Amer: 52 mL/min — ABNORMAL LOW (ref >60)
GFR calc non Af Amer: 45 mL/min — ABNORMAL LOW (ref >60)
Glucose, Bld: 108 mg/dL — ABNORMAL HIGH (ref 70–99)
Potassium: 3.8 mmol/L (ref 3.5–5.1)
Sodium: 136 mmol/L (ref 135–145)

## 2019-06-29 LAB — MAGNESIUM: Magnesium: 2 mg/dL (ref 1.7–2.4)

## 2019-06-29 MED ORDER — SODIUM CHLORIDE 0.9 % IV SOLN
2.0000 g | INTRAVENOUS | Status: AC
  Filled 2019-06-29: qty 2

## 2019-06-29 MED ORDER — GABAPENTIN 300 MG PO CAPS
300.0000 mg | ORAL_CAPSULE | ORAL | Status: AC
  Administered 2019-06-30: 07:00:00 300 mg via ORAL
  Filled 2019-06-29: qty 1

## 2019-06-29 MED ORDER — SPIRONOLACTONE 12.5 MG HALF TABLET: 12.5000 mg | ORAL_TABLET | Freq: Every day | ORAL | Status: DC

## 2019-06-29 MED ORDER — CHLORHEXIDINE GLUCONATE CLOTH 2 % EX PADS
6.0000 | MEDICATED_PAD | Freq: Once | CUTANEOUS | Status: AC
  Administered 2019-06-29: 6 via TOPICAL

## 2019-06-29 MED ORDER — ENOXAPARIN SODIUM 40 MG/0.4ML ~~LOC~~ SOLN: 40.0000 mg | Freq: Once | SUBCUTANEOUS | Status: DC

## 2019-06-29 MED ORDER — ACETAMINOPHEN 500 MG PO TABS
1000.0000 mg | ORAL_TABLET | ORAL | Status: AC
  Administered 2019-06-30: 1000 mg via ORAL
  Filled 2019-06-29: qty 2

## 2019-06-29 MED ORDER — METRONIDAZOLE 500 MG PO TABS
1000.0000 mg | ORAL_TABLET | ORAL | Status: AC
  Administered 2019-06-29 (×3): 1000 mg via ORAL
  Filled 2019-06-29 (×3): qty 2

## 2019-06-29 MED ORDER — CHLORHEXIDINE GLUCONATE CLOTH 2 % EX PADS: 6.0000 | MEDICATED_PAD | Freq: Once | CUTANEOUS | Status: DC

## 2019-06-29 MED ORDER — NEOMYCIN SULFATE 500 MG PO TABS
1000.0000 mg | ORAL_TABLET | ORAL | Status: AC
  Administered 2019-06-29 (×3): 1000 mg via ORAL
  Filled 2019-06-29 (×3): qty 2

## 2019-06-29 NOTE — Progress Notes (Signed)
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  In ED, HDS.  CT abdomen and pelvis revealed circumferential wall thickening within the distal transverse colon concerning for annular apple core malignancy.  GI consulted.  Had colonoscopy on 06/27/2019 concerning for malignant partially obstructing tumor in the distal transverse colon.  Biopsy obtained.  Per GI, pathology consistent with adenocarcinoma.  General surgery consulted. Plan is for exlaparotomy and colon resection 06/29/2019. Echo with EF of 60 to 65% and no other significant finding.  Patient has been cleared by advanced heart failure team for surgery.  Subjective: Continues to endorse abdominal pain.  Had 3 loose stools overnight.  Still with a sense of bloating.  Denies nausea, vomiting, melena or hematochezia.  Denies chest pain, dyspnea or cough.  Objective: Vitals:   06/28/19 2147 06/29/19 0441 06/29/19 0500 06/29/19 0700  BP: 123/61 (!) 112/56    Pulse: 73 70    Resp: 17 18    Temp: 100.1 F (37.8 C) 98.5 F (36.9 C)    TempSrc: Oral Oral    SpO2: 100% 100%  99%  Weight:   64.2 kg   Height:        Intake/Output Summary (Last 24 hours) at 06/29/2019 1205 Last data filed at 06/29/2019 0443 Gross per 24 hour  Intake 500 ml  Output 100 ml  Net 400 ml   Filed Weights   06/27/19 1158 06/28/19 0500 06/29/19 0500  Weight: 64 kg 64.2 kg 64.2 kg    Examination:  GENERAL: No acute distress.  Appears well.  HEENT: MMM.  Vision and hearing grossly  intact.  NECK: Supple.  No apparent JVD.  RESP:  No IWOB. Good air movement bilaterally. CVS:  RRR. Heart sounds normal.  ABD/GI/GU: Bowel sounds present. Soft.  Mild diffuse tenderness. MSK/EXT:  Moves extremities. No apparent deformity or edema.  SKIN: no apparent skin lesion or wound NEURO: Awake, alert and oriented appropriately.  No apparent focal neuro deficit. PSYCH: Calm. Normal affect  Assessment & Plan: Adenocarcinoma of transverse colon: confirmed on colonoscopy and pathology. -Plan for exlaparotomy and colon resection 06/30/2019.   -Echo basically normal now (previously 30 to 35%) -cleared by advanced HF team for surgery-judicious IV fluid and continuing BB  -Continue bowel regimen-MiraLAX -N.p.o. after midnight  Intermittent nausea/vomiting/abdominal pain: still with some abdominal pain but no nausea or vomiting. -As needed analgesics and antiemetics  Chronic systolic CHF now with normal EF: Echo with EF of 60 to 65% (previously 30 to 35%).  No cardiopulmonary symptoms.  Appears euvolemic.  I and O incomplete. -Discontinued losartan and Aldactone in the setting of worsening renal function. -Continue Coreg, BiDil and low-dose Lasix -Monitor fluid status, renal function and electrolytes.  History of PVC: NSR now.  Echo as above. -Continue low-dose amiodarone  CKD-3a: Baseline Cr 1.4-1.6. Stable -Cr 1.58 (admit)> 1.26> 1.58 -Holding losartan and Aldactone as above. -Continue monitoring  Iron deficiency anemia: Iron saturation 5%. -Hgb 12-13 (baseline)> 11.5 (admit)> 9.4> 9.7 -Received IV Feraheme on 11/29. -P.o. ferrous sulfate  Leukocytosis/bandemia: suspect demargination.  No obvious  signs of infection. -Continue trending  Essential hypertension: Normotensive. -Cardiac meds as above  Chronic asthma/COPD: Stable.  -Continue albuterol and Dulera   Nutrition Problem: Moderate Malnutrition Etiology: chronic illness(ETOH dependence, CHF, colon mass with  probable colon cancer)  Signs/Symptoms: energy intake < 75% for > or equal to 1 month, mild fat depletion, moderate fat depletion, mild muscle depletion, moderate muscle depletion  Interventions: Ensure Enlive (each supplement provides 350kcal and 20 grams of protein), MVI, Magic cup   DVT prophylaxis: Subcu Lovenox Code Status: Full code Family Communication: Patient and/or RN. Available if any question. Disposition Plan: Remains inpatient Consultants: GI (signed off), general surgery, advanced HF  Procedures:  11/30-colonoscopy concerning for distal transverse colon cancer  Microbiology summarized: COVID-19 screen negative.  Sch Meds:  Scheduled Meds: . acetaminophen  1,000 mg Oral On Call to OR  . amiodarone  100 mg Oral Daily  . carvedilol  3.125 mg Oral BID WC  . Chlorhexidine Gluconate Cloth  6 each Topical Once   And  . Chlorhexidine Gluconate Cloth  6 each Topical Once  . enoxaparin (LOVENOX) injection  40 mg Subcutaneous Once  . ferrous sulfate  325 mg Oral BID WC  . fluticasone  2 spray Each Nare Daily  . furosemide  20 mg Oral Daily  . gabapentin  300 mg Oral On Call to OR  . isosorbide-hydrALAZINE  0.5 tablet Oral TID  . loratadine  10 mg Oral Daily  . neomycin  1,000 mg Oral 3 times per day   And  . metroNIDAZOLE  1,000 mg Oral 3 times per day  . mometasone-formoterol  2 puff Inhalation BID  . montelukast  10 mg Oral QHS  . polyethylene glycol  17 g Oral BID   Continuous Infusions: . cefoTEtan (CEFOTAN) IV     PRN Meds:.acetaminophen **OR** acetaminophen, albuterol, morphine injection, ondansetron (ZOFRAN) IV, oxyCODONE, pneumococcal 23 valent vaccine, polyethylene glycol  Antimicrobials: Anti-infectives (From admission, onward)   Start     Dose/Rate Route Frequency Ordered Stop   06/29/19 1400  neomycin (MYCIFRADIN) tablet 1,000 mg     1,000 mg Oral 3 times per day 06/29/19 0901 06/30/19 1359   06/29/19 1400  metroNIDAZOLE (FLAGYL) tablet 1,000 mg      1,000 mg Oral 3 times per day 06/29/19 0901 06/30/19 1359   06/29/19 1000  cefoTEtan (CEFOTAN) 2 g in sodium chloride 0.9 % 100 mL IVPB     2 g 200 mL/hr over 30 Minutes Intravenous On call to O.R. 06/29/19 0901 06/30/19 0559       I have personally reviewed the following labs and images: CBC: Recent Labs  Lab 06/26/19 0055 06/26/19 0624 06/27/19 0227 06/28/19 0215 06/29/19 0334  WBC 16.0* 16.9* 9.3 11.0* 16.9*  NEUTROABS  --   --  6.3 8.3* 13.1*  HGB 11.5* 11.6* 9.4* 9.4* 9.7*  HCT 36.7* 36.5* 29.8* 29.7* 29.7*  MCV 78.8* 78.5* 77.8* 76.7* 76.9*  PLT 538* 486* 325 319 293   BMP &GFR Recent Labs  Lab 06/26/19 0055 06/26/19 0624 06/27/19 0227 06/28/19 0215 06/29/19 0334  NA 141 140 136 137 136  K 3.3* 3.5 3.2* 3.7 3.8  CL 105 103 102 104 101  CO2 22 26 23 24 23   GLUCOSE 126* 119* 107* 91 108*  BUN 31* 26* 19 12 14   CREATININE 1.58* 1.25* 1.33* 1.26* 1.58*  CALCIUM 9.1 8.9 8.3* 8.3* 8.3*  MG  --   --   --   --  2.0  Estimated Creatinine Clearance: 41.8 mL/min (A) (by C-G formula based on SCr of 1.58 mg/dL (H)). Liver & Pancreas: Recent Labs  Lab 06/26/19 0055 06/26/19 0624  AST 16 18  ALT 20 21  ALKPHOS 72 76  BILITOT 0.3 0.5  PROT 7.1 6.7  ALBUMIN 3.5 3.4*   Recent Labs  Lab 06/26/19 0055  LIPASE 24   No results for input(s): AMMONIA in the last 168 hours. Diabetic: No results for input(s): HGBA1C in the last 72 hours. No results for input(s): GLUCAP in the last 168 hours. Cardiac Enzymes: No results for input(s): CKTOTAL, CKMB, CKMBINDEX, TROPONINI in the last 168 hours. No results for input(s): PROBNP in the last 8760 hours. Coagulation Profile: Recent Labs  Lab 06/26/19 0152  INR 0.9   Thyroid Function Tests: No results for input(s): TSH, T4TOTAL, FREET4, T3FREE, THYROIDAB in the last 72 hours. Lipid Profile: No results for input(s): CHOL, HDL, LDLCALC, TRIG, CHOLHDL, LDLDIRECT in the last 72 hours. Anemia Panel: No results for  input(s): VITAMINB12, FOLATE, FERRITIN, TIBC, IRON, RETICCTPCT in the last 72 hours. Urine analysis:    Component Value Date/Time   COLORURINE STRAW (A) 06/26/2019 0051   APPEARANCEUR CLEAR 06/26/2019 0051   LABSPEC 1.025 06/26/2019 0051   PHURINE 6.0 06/26/2019 0051   GLUCOSEU NEGATIVE 06/26/2019 0051   HGBUR NEGATIVE 06/26/2019 0051   BILIRUBINUR NEGATIVE 06/26/2019 [place] 06/26/2019 0051   PROTEINUR NEGATIVE 06/26/2019 0051   UROBILINOGEN 1.0 07/16/2013 1404   NITRITE NEGATIVE 06/26/2019 0051   LEUKOCYTESUR NEGATIVE 06/26/2019 0051   Sepsis Labs: Invalid input(s): PROCALCITONIN, [place]  Microbiology: Recent Results (from the past 240 hour(s))  SARS CORONAVIRUS 2 (TAT 6-24 HRS) Nasopharyngeal Urine, Clean Catch     Status: None   Collection Time: 06/26/19  3:22 AM   Specimen: Urine, Clean Catch; Nasopharyngeal  Result Value Ref Range Status   SARS Coronavirus 2 NEGATIVE NEGATIVE Final    Comment: (NOTE) SARS-CoV-2 target nucleic acids are NOT DETECTED. The SARS-CoV-2 RNA is generally detectable in upper and lower respiratory specimens during the acute phase of infection. Negative results do not preclude SARS-CoV-2 infection, do not rule out co-infections with other pathogens, and should not be used as the sole basis for treatment or other patient management decisions. Negative results must be combined with clinical observations, patient history, and epidemiological information. The expected result is Negative. Fact Sheet for Patients: SugarRoll.be Fact Sheet for Healthcare Providers: https://www.woods-mathews.com/ This test is not yet approved or cleared by the [place] FDA and  has been authorized for detection and/or diagnosis of SARS-CoV-2 by FDA under an Emergency Use Authorization (EUA). This EUA will remain  in effect (meaning this test can be used) for the duration of the COVID-19 declaration  under Section 56 4(b)(1) of the Act, 21 U.S.C. section 360bbb-3(b)(1), unless the authorization is terminated or revoked sooner. Performed at [facility], [place] [address] [place], [place] [address]   Surgical pcr screen     Status: None   Collection Time: 06/28/19  3:05 PM   Specimen: Nasal Mucosa; Nasal Swab  Result Value Ref Range Status   MRSA, PCR NEGATIVE NEGATIVE Final   Staphylococcus aureus NEGATIVE NEGATIVE Final    Comment: (NOTE) The Xpert SA Assay (FDA approved for NASAL specimens in patients 22 years of age and older), is one component of a comprehensive surveillance program. It is not intended to diagnose infection nor to guide or monitor treatment. Performed at [facility], [address] [place], [place] [address]     Radiology Studies: No results found.   Kynzley Dowson T. [place]  If 7PM-7AM, please contact night-coverage [email or web address] Password [facility] 06/29/2019, 12:05 PM

## 2019-06-29 NOTE — Consult Note (Signed)
[place] Nurse ostomy consult note  [place] Nurse requested for preoperative stoma site marking by Dr. Rosendo Gros.  Discussed surgical procedure and stoma creation with patient and family.  Explained role of the [place] nurse team.  Provided the patient with educational booklet and provided samples of pouching options.  Answered patient  questions.   Examined patient lying, sitting, and standing in order to place the marking in the patient's visual field, away from any creases or abdominal contour issues and within the rectus muscle.  Attempted to mark below the patient's belt line.   Marked for colostomy in the LLQ  5.5 cm to the left of the umbilicus and 3 cm below the umbilicus.  Patient's abdomen cleansed with CHG wipes at site marking, allowed to air dry prior to marking. Covered mark with thin film transparent dressing to preserve mark until date of surgery (tomorrow, 12/3). Communication with Modena Jansky, CCS PA regarding mark.  [place] Nursing will remain available in the event patient requires an ostomy. Please reconsult if an ostomy is created intraoperatively.  Thanks, Maudie Flakes, MSN, RN, [place], Arther Abbott  Pager# [phone]

## 2019-06-29 NOTE — Progress Notes (Signed)
  Follow-up echo yesterday (12/1) shows normalization of LV function (EF 60-65$ now) with suppression of PVCs.   Will follow post-op.   For colon resection today.  Glori Bickers, MD  8:19 AM

## 2019-06-29 NOTE — Plan of Care (Signed)

## 2019-06-29 NOTE — Progress Notes (Signed)
[facility] Progress Note  2 Days Post-Op  Subjective: CC-  Patient reports having 3 partially loose partially formed BMs yesterday, and 1 today. He does feel bloated at times. Abdominal pain improved after BM. Denies any n/v. Ambulating without issues.  Objective: Vital signs in last 24 hours: Temp:  [98.5 F (36.9 C)-100.1 F (37.8 C)] 98.5 F (36.9 C) (12/02 0441) Pulse Rate:  [67-73] 70 (12/02 0441) Resp:  [17-18] 18 (12/02 0441) BP: (112-123)/(56-61) 112/56 (12/02 0441) SpO2:  [98 %-100 %] 99 % (12/02 0700) Weight:  [64.2 kg] 64.2 kg (12/02 0500) Last BM Date: 06/27/19  Intake/Output from previous day: 12/01 0701 - 12/02 0700 In: 500 [P.O.:500] Out: 100 [Urine:100] Intake/Output this shift: No intake/output data recorded.  PE: Gen:  Alert, NAD, pleasant HEENT: EOM's intact, pupils equal and round Card:  RRR Pulm:  CTAB, no W/R/R, effort normal Abd: Soft, minimal distension, +BS, mild lower abdominal TTP Skin: warm and dry  Lab Results:  Recent Labs    06/28/19 0215 06/29/19 0334  WBC 11.0* 16.9*  HGB 9.4* 9.7*  HCT 29.7* 29.7*  PLT 319 293   BMET Recent Labs    06/28/19 0215 06/29/19 0334  NA 137 136  K 3.7 3.8  CL 104 101  CO2 24 23  GLUCOSE 91 108*  BUN 12 14  CREATININE 1.26* 1.58*  CALCIUM 8.3* 8.3*   PT/INR No results for input(s): LABPROT, INR in the last 72 hours. CMP     Component Value Date/Time   NA 136 06/29/2019 0334   K 3.8 06/29/2019 0334   CL 101 06/29/2019 0334   CO2 23 06/29/2019 0334   GLUCOSE 108 (H) 06/29/2019 0334   BUN 14 06/29/2019 0334   CREATININE 1.58 (H) 06/29/2019 0334   CALCIUM 8.3 (L) 06/29/2019 0334   PROT 6.7 06/26/2019 0624   ALBUMIN 3.4 (L) 06/26/2019 0624   AST 18 06/26/2019 0624   ALT 21 06/26/2019 0624   ALKPHOS 76 06/26/2019 0624   BILITOT 0.5 06/26/2019 0624   GFRNONAA 45 (L) 06/29/2019 0334   GFRAA 52 (L) 06/29/2019 0334   Lipase     Component Value Date/Time   LIPASE 24  06/26/2019 0055       Studies/Results: Ct Chest W Contrast  Result Date: 06/28/2019 CLINICAL DATA:  Preop. Known colon mass. Incomplete colonoscopy. EXAM: CT CHEST WITH CONTRAST TECHNIQUE: Multidetector CT imaging of the chest was performed during intravenous contrast administration. CONTRAST:  14mL OMNIPAQUE IOHEXOL 300 MG/ML  SOLN COMPARISON:  10/03/2018 FINDINGS: Cardiovascular: The heart size appears within normal limits. No pericardial effusion identified. Aortic atherosclerosis. Mediastinum/Nodes: No enlarged mediastinal, hilar, or axillary lymph nodes. Thyroid gland, trachea, and esophagus demonstrate no significant findings. Lungs/Pleura: No pleural effusion identified. No airspace consolidation, atelectasis or pneumothorax identified. 3 mm nodule in the posteromedial right lower lobe is identified, image 74/3. Unchanged. Upper Abdomen: No acute abnormality identified. Scarring involving left kidney is again identified. Chronic right renal atrophy. Musculoskeletal: No chest wall abnormality. No acute or significant osseous findings. IMPRESSION: 1. No active cardiopulmonary abnormalities. No specific findings identified to suggest metastatic disease to the chest. 2. Unchanged 3 mm posteromedial right lower lobe lung nodule. 3. Aortic atherosclerosis. Aortic Atherosclerosis (ICD10-I70.0). Electronically Signed   By: Kerby Moors M.D.   On: 06/28/2019 09:53    Anti-infectives: Anti-infectives (From admission, onward)   Start     Dose/Rate Route Frequency Ordered Stop   06/29/19 1400  neomycin (MYCIFRADIN) tablet 1,000 mg  1,000 mg Oral 3 times per day 06/29/19 0901 06/30/19 1359   06/29/19 1400  metroNIDAZOLE (FLAGYL) tablet 1,000 mg     1,000 mg Oral 3 times per day 06/29/19 0901 06/30/19 1359   06/29/19 0915  cefoTEtan (CEFOTAN) 2 g in sodium chloride 0.9 % 100 mL IVPB     2 g 200 mL/hr over 30 Minutes Intravenous On call to O.R. 06/29/19 0901 06/30/19 0559        Assessment/Plan Weight loss AKI on CKD stage III Hx chronic systolic diastolic heart failure, EF improved 60 to 65% (ECHO 06/28/2019) - appreciate cardiology consult, "low risk for peri-op cardiac complications and can proceed with surgery tomorrow from our perspective" Anemia EtOH abuse. Asthma on inhalers Hypertension Malnutrition - prealbumin 17.2 (12/1). Appreciate nutrition consult  Obstructing colon mass - Colonoscopy 06/27/2019, Dr. Havery Moros: A partially obstructing suspected mass lesion was found in the distal transverse colon, with severe luminal narrowing. The lesion could not be be traversed and was circumferential. Biopsies were taken bowel prep was not adequate.    -Pathology: adenocarcinoma  FEN: CLD, NPO after MN ID: neomycin/flagyl preop DVT: Lovenox Follow-up: TBD  Plan: Preop today with neomycin/flagyl bowel prep, presurgery Ensure. Marked for stoma by WOC. Plan for ex lap, partial colectomy, possible colostomy tomorrow.   LOS: 2 days    Wellington [place], [facility] 06/29/2019, 9:04 AM Please see Amion for pager number during day hours 7:00am-4:30pm

## 2019-06-29 NOTE — Anesthesia Preprocedure Evaluation (Addendum)
Anesthesia Evaluation  Patient identified by MRN, date of birth, ID band Patient awake    Reviewed: Allergy & Precautions, NPO status , Patient's Chart, lab work & pertinent test results, Unable to perform ROS - Chart review only  History of Anesthesia Complications Negative for: history of anesthetic complications  Airway Mallampati: I       Dental  (+) Poor Dentition, Edentulous Lower   Pulmonary asthma , COPD,  COPD inhaler, former smoker,    Pulmonary exam normal        Cardiovascular hypertension, Pt. on medications and Pt. on home beta blockers + Past MI and +CHF  Normal cardiovascular exam  TTE 06/28/19: EF 60-65%, mild LAE   Neuro/Psych Depression negative neurological ROS     GI/Hepatic negative GI ROS, Neg liver ROS,   Endo/Other  negative endocrine ROS  Renal/GU Renal InsufficiencyRenal diseaseSolitary kidney  negative genitourinary   Musculoskeletal  (+) Arthritis , Osteoarthritis,    Abdominal   Peds  Hematology  (+) anemia , Hgb 9.7   Anesthesia Other Findings Day of surgery medications reviewed with patient.  Reproductive/Obstetrics negative OB ROS                         Anesthesia Physical Anesthesia Plan  ASA: III  Anesthesia Plan: General   Post-op Pain Management:    Induction: Intravenous  PONV Risk Score and Plan: 4 or greater and Treatment may vary due to age or medical condition, Ondansetron, Dexamethasone and Midazolam  Airway Management Planned: Oral ETT  Additional Equipment: None  Intra-op Plan:   Post-operative Plan: Extubation in OR  Informed Consent: I have reviewed the patients History and Physical, chart, labs and discussed the procedure including the risks, benefits and alternatives for the proposed anesthesia with the patient or authorized representative who has indicated his/her understanding and acceptance.     Dental advisory  given  Plan Discussed with: CRNA  Anesthesia Plan Comments:        Anesthesia Quick Evaluation

## 2019-06-30 ENCOUNTER — Encounter (HOSPITAL_COMMUNITY): Payer: Self-pay | Admitting: Anesthesiology

## 2019-06-30 ENCOUNTER — Encounter (HOSPITAL_COMMUNITY)
Admission: EM | Disposition: A | Discharge status: Home / Self Care | Payer: Self-pay | Source: Home / Self Care | Attending: Student

## 2019-06-30 ENCOUNTER — Other Ambulatory Visit: Payer: Self-pay

## 2019-06-30 ENCOUNTER — Inpatient Hospital Stay (HOSPITAL_COMMUNITY): Payer: Medicare Other | Admitting: Anesthesiology

## 2019-06-30 DIAGNOSIS — I5022 Chronic systolic (congestive) heart failure: Secondary | ICD-10-CM

## 2019-06-30 DIAGNOSIS — Z9049 Acquired absence of other specified parts of digestive tract: Secondary | ICD-10-CM

## 2019-06-30 HISTORY — PX: LAPAROTOMY: SHX154

## 2019-06-30 HISTORY — PX: PARTIAL COLECTOMY: SHX5273

## 2019-06-30 LAB — CBC
HCT: 33.2 % — ABNORMAL LOW (ref 39.0–52.0)
Hemoglobin: 10.6 g/dL — ABNORMAL LOW (ref 13.0–17.0)
MCH: 24.6 pg — ABNORMAL LOW (ref 26.0–34.0)
MCHC: 31.9 g/dL (ref 30.0–36.0)
MCV: 77 fL — ABNORMAL LOW (ref 80.0–100.0)
Platelets: 382 10*3/uL (ref 150–400)
RBC: 4.31 MIL/uL (ref 4.22–5.81)
RDW: 16.1 % — ABNORMAL HIGH (ref 11.5–15.5)
WBC: 15.9 10*3/uL — ABNORMAL HIGH (ref 4.0–10.5)
nRBC: 0 % (ref 0.0–0.2)

## 2019-06-30 LAB — TYPE AND SCREEN
ABO/RH(D): A POS
Antibody Screen: NEGATIVE

## 2019-06-30 LAB — CREATININE, SERUM
Creatinine, Ser: 1.39 mg/dL — ABNORMAL HIGH (ref 0.61–1.24)
GFR calc Af Amer: >60 mL/min (ref >60)
GFR calc non Af Amer: 52 mL/min — ABNORMAL LOW (ref >60)

## 2019-06-30 LAB — MAGNESIUM: Magnesium: 2.2 mg/dL (ref 1.7–2.4)

## 2019-06-30 SURGERY — LAPAROTOMY, EXPLORATORY
Anesthesia: General | Site: Abdomen

## 2019-06-30 MED ORDER — 0.9 % SODIUM CHLORIDE (POUR BTL) OPTIME
TOPICAL | Status: DC | PRN
  Administered 2019-06-30 (×4): 1000 mL

## 2019-06-30 MED ORDER — ONDANSETRON HCL 4 MG/2ML IJ SOLN
INTRAMUSCULAR | Status: AC
  Filled 2019-06-30: qty 2

## 2019-06-30 MED ORDER — FENTANYL CITRATE (PF) 100 MCG/2ML IJ SOLN
INTRAMUSCULAR | Status: DC | PRN
  Administered 2019-06-30 (×5): 50 ug via INTRAVENOUS

## 2019-06-30 MED ORDER — ENSURE SURGERY PO LIQD
237.0000 mL | Freq: Two times a day (BID) | ORAL | Status: DC
  Administered 2019-07-01 – 2019-07-04 (×4): 237 mL via ORAL
  Filled 2019-06-30 (×9): qty 237

## 2019-06-30 MED ORDER — PROPOFOL 10 MG/ML IV BOLUS
INTRAVENOUS | Status: DC | PRN
  Administered 2019-06-30: 100 mg via INTRAVENOUS

## 2019-06-30 MED ORDER — GABAPENTIN 300 MG PO CAPS
300.0000 mg | ORAL_CAPSULE | Freq: Two times a day (BID) | ORAL | Status: DC
  Administered 2019-06-30 – 2019-07-04 (×9): 300 mg via ORAL
  Filled 2019-06-30 (×9): qty 1

## 2019-06-30 MED ORDER — ALVIMOPAN 12 MG PO CAPS: 12.0000 mg | ORAL_CAPSULE | Freq: Two times a day (BID) | ORAL | Status: DC

## 2019-06-30 MED ORDER — DIPHENHYDRAMINE HCL 12.5 MG/5ML PO ELIX: 12.5000 mg | ORAL_SOLUTION | Freq: Four times a day (QID) | ORAL | Status: DC | PRN

## 2019-06-30 MED ORDER — PROMETHAZINE HCL 25 MG/ML IJ SOLN: 6.2500 mg | INTRAMUSCULAR | Status: DC | PRN

## 2019-06-30 MED ORDER — SUGAMMADEX SODIUM 200 MG/2ML IV SOLN
INTRAVENOUS | Status: DC | PRN
  Administered 2019-06-30: 200 mg via INTRAVENOUS

## 2019-06-30 MED ORDER — ROCURONIUM BROMIDE 10 MG/ML (PF) SYRINGE
PREFILLED_SYRINGE | INTRAVENOUS | Status: DC | PRN
  Administered 2019-06-30: 60 mg via INTRAVENOUS

## 2019-06-30 MED ORDER — DEXAMETHASONE SODIUM PHOSPHATE 10 MG/ML IJ SOLN
INTRAMUSCULAR | Status: DC | PRN
  Administered 2019-06-30: 10 mg via INTRAVENOUS

## 2019-06-30 MED ORDER — SODIUM CHLORIDE 0.9 % IV SOLN
2.0000 g | INTRAVENOUS | Status: AC
  Administered 2019-06-30: 2 g via INTRAVENOUS
  Filled 2019-06-30: qty 2

## 2019-06-30 MED ORDER — OXYCODONE HCL 5 MG PO TABS
5.0000 mg | ORAL_TABLET | Freq: Once | ORAL | Status: AC | PRN
  Administered 2019-06-30: 10:00:00 5 mg via ORAL

## 2019-06-30 MED ORDER — MIDAZOLAM HCL 2 MG/2ML IJ SOLN
INTRAMUSCULAR | Status: AC
  Filled 2019-06-30: qty 2

## 2019-06-30 MED ORDER — ONDANSETRON HCL 4 MG/2ML IJ SOLN: 4.0000 mg | Freq: Four times a day (QID) | INTRAMUSCULAR | Status: DC | PRN

## 2019-06-30 MED ORDER — FENTANYL CITRATE (PF) 250 MCG/5ML IJ SOLN
INTRAMUSCULAR | Status: AC
  Filled 2019-06-30: qty 5

## 2019-06-30 MED ORDER — DEXAMETHASONE SODIUM PHOSPHATE 10 MG/ML IJ SOLN
INTRAMUSCULAR | Status: AC
  Filled 2019-06-30: qty 1

## 2019-06-30 MED ORDER — ENSURE PRE-SURGERY PO LIQD
296.0000 mL | Freq: Once | ORAL | Status: AC
  Administered 2019-06-30: 04:00:00 296 mL via ORAL
  Filled 2019-06-30: qty 296

## 2019-06-30 MED ORDER — HYDROMORPHONE HCL 1 MG/ML IJ SOLN
INTRAMUSCULAR | Status: AC
  Filled 2019-06-30: qty 1

## 2019-06-30 MED ORDER — ONDANSETRON HCL 4 MG PO TABS: 4.0000 mg | ORAL_TABLET | Freq: Four times a day (QID) | ORAL | Status: DC | PRN

## 2019-06-30 MED ORDER — DIPHENHYDRAMINE HCL 50 MG/ML IJ SOLN: 12.5000 mg | Freq: Four times a day (QID) | INTRAMUSCULAR | Status: DC | PRN

## 2019-06-30 MED ORDER — OXYCODONE HCL 5 MG PO TABS
5.0000 mg | ORAL_TABLET | ORAL | Status: DC | PRN
  Administered 2019-06-30 – 2019-07-04 (×4): 10 mg via ORAL
  Filled 2019-06-30 (×5): qty 2

## 2019-06-30 MED ORDER — LIDOCAINE 2% (20 MG/ML) 5 ML SYRINGE
INTRAMUSCULAR | Status: AC
  Filled 2019-06-30: qty 5

## 2019-06-30 MED ORDER — CHLORHEXIDINE GLUCONATE CLOTH 2 % EX PADS
6.0000 | MEDICATED_PAD | Freq: Every day | CUTANEOUS | Status: DC
  Administered 2019-07-01 – 2019-07-04 (×4): 6 via TOPICAL

## 2019-06-30 MED ORDER — ENOXAPARIN SODIUM 30 MG/0.3ML ~~LOC~~ SOLN
30.0000 mg | SUBCUTANEOUS | Status: DC
  Administered 2019-07-01 – 2019-07-04 (×4): 30 mg via SUBCUTANEOUS
  Filled 2019-06-30 (×4): qty 0.3

## 2019-06-30 MED ORDER — HYDROMORPHONE HCL 1 MG/ML IJ SOLN
0.2500 mg | INTRAMUSCULAR | Status: DC | PRN
  Administered 2019-06-30 (×4): 0.5 mg via INTRAVENOUS

## 2019-06-30 MED ORDER — HYDROMORPHONE HCL 1 MG/ML IJ SOLN
1.0000 mg | INTRAMUSCULAR | Status: DC | PRN
  Administered 2019-06-30 – 2019-07-01 (×3): 1 mg via INTRAVENOUS
  Filled 2019-06-30 (×3): qty 1

## 2019-06-30 MED ORDER — PHENYLEPHRINE HCL-NACL 10-0.9 MG/250ML-% IV SOLN
INTRAVENOUS | Status: DC | PRN
  Administered 2019-06-30: 25 ug/min via INTRAVENOUS

## 2019-06-30 MED ORDER — OXYCODONE HCL 5 MG/5ML PO SOLN: 5.0000 mg | Freq: Once | ORAL | Status: AC | PRN

## 2019-06-30 MED ORDER — ROCURONIUM BROMIDE 10 MG/ML (PF) SYRINGE
PREFILLED_SYRINGE | INTRAVENOUS | Status: AC
  Filled 2019-06-30: qty 10

## 2019-06-30 MED ORDER — LIDOCAINE 2% (20 MG/ML) 5 ML SYRINGE
INTRAMUSCULAR | Status: DC | PRN
  Administered 2019-06-30: 80 mg via INTRAVENOUS

## 2019-06-30 MED ORDER — OXYCODONE HCL 5 MG PO TABS
ORAL_TABLET | ORAL | Status: AC
  Filled 2019-06-30: qty 1

## 2019-06-30 MED ORDER — LACTATED RINGERS IV SOLN
INTRAVENOUS | Status: DC | PRN
  Administered 2019-06-30: 07:00:00 via INTRAVENOUS

## 2019-06-30 MED ORDER — MIDAZOLAM HCL 5 MG/5ML IJ SOLN
INTRAMUSCULAR | Status: DC | PRN
  Administered 2019-06-30: 2 mg via INTRAVENOUS

## 2019-06-30 MED ORDER — FERROUS SULFATE 325 (65 FE) MG PO TABS
325.0000 mg | ORAL_TABLET | Freq: Two times a day (BID) | ORAL | Status: DC
  Administered 2019-07-03 – 2019-07-04 (×2): 325 mg via ORAL
  Filled 2019-06-30 (×2): qty 1

## 2019-06-30 MED ORDER — ONDANSETRON HCL 4 MG/2ML IJ SOLN
INTRAMUSCULAR | Status: DC | PRN
  Administered 2019-06-30: 4 mg via INTRAVENOUS

## 2019-06-30 MED ORDER — KCL IN DEXTROSE-NACL 20-5-0.9 MEQ/L-%-% IV SOLN
INTRAVENOUS | Status: DC
  Administered 2019-06-30 – 2019-07-01 (×3): via INTRAVENOUS
  Filled 2019-06-30 (×3): qty 1000

## 2019-06-30 MED ORDER — PROPOFOL 10 MG/ML IV BOLUS
INTRAVENOUS | Status: AC
  Filled 2019-06-30: qty 20

## 2019-06-30 SURGICAL SUPPLY — 57 items
APL PRP STRL LF DISP 70% ISPRP (MISCELLANEOUS) ×1
BLADE CLIPPER SURG (BLADE) IMPLANT
CANISTER SUCT 3000ML PPV (MISCELLANEOUS) ×3 IMPLANT
CHLORAPREP W/TINT 26 (MISCELLANEOUS) ×3 IMPLANT
COVER SURGICAL LIGHT HANDLE (MISCELLANEOUS) ×3 IMPLANT
COVER WAND RF STERILE (DRAPES) ×3 IMPLANT
DRAPE LAPAROSCOPIC ABDOMINAL (DRAPES) ×3 IMPLANT
DRAPE WARM FLUID 44X44 (DRAPES) ×1 IMPLANT
DRSG OPSITE POSTOP 4X10 (GAUZE/BANDAGES/DRESSINGS) IMPLANT
DRSG OPSITE POSTOP 4X12 (GAUZE/BANDAGES/DRESSINGS) ×2 IMPLANT
DRSG OPSITE POSTOP 4X8 (GAUZE/BANDAGES/DRESSINGS) IMPLANT
ELECT BLADE 6.5 EXT (BLADE) ×2 IMPLANT
ELECT CAUTERY BLADE 6.4 (BLADE) ×5 IMPLANT
ELECT REM PT RETURN 9FT ADLT (ELECTROSURGICAL) ×3
ELECTRODE REM PT RTRN 9FT ADLT (ELECTROSURGICAL) ×1 IMPLANT
GLOVE BIO SURGEON STRL SZ7.5 (GLOVE) ×6 IMPLANT
GLOVE BIOGEL PI IND STRL 8 (GLOVE) ×2 IMPLANT
GLOVE BIOGEL PI INDICATOR 8 (GLOVE) ×4
GOWN STRL REUS W/ TWL LRG LVL3 (GOWN DISPOSABLE) ×4 IMPLANT
GOWN STRL REUS W/ TWL XL LVL3 (GOWN DISPOSABLE) ×2 IMPLANT
GOWN STRL REUS W/TWL LRG LVL3 (GOWN DISPOSABLE) ×12
GOWN STRL REUS W/TWL XL LVL3 (GOWN DISPOSABLE) ×6
HANDLE SUCTION POOLE (INSTRUMENTS) ×1 IMPLANT
KIT BASIN OR (CUSTOM PROCEDURE TRAY) ×3 IMPLANT
KIT SIGMOIDOSCOPE (SET/KITS/TRAYS/PACK) IMPLANT
KIT TURNOVER KIT B (KITS) ×3 IMPLANT
LIGASURE IMPACT 36 18CM CVD LR (INSTRUMENTS) ×2 IMPLANT
NS IRRIG 1000ML POUR BTL (IV SOLUTION) ×6 IMPLANT
PACK COLON (CUSTOM PROCEDURE TRAY) ×3 IMPLANT
PACK GENERAL/GYN (CUSTOM PROCEDURE TRAY) ×3 IMPLANT
PAD ARMBOARD 7.5X6 YLW CONV (MISCELLANEOUS) ×3 IMPLANT
PENCIL SMOKE EVACUATOR (MISCELLANEOUS) ×3 IMPLANT
RELOAD PROXIMATE 75MM BLUE (ENDOMECHANICALS) ×3 IMPLANT
RELOAD STAPLE 75 3.8 BLU REG (ENDOMECHANICALS) IMPLANT
SLEEVE SUCTION CATH 165 (SLEEVE) ×2 IMPLANT
SPECIMEN JAR LARGE (MISCELLANEOUS) IMPLANT
SPONGE LAP 18X18 RF (DISPOSABLE) IMPLANT
STAPLER PROXIMATE 75MM BLUE (STAPLE) ×2 IMPLANT
STAPLER VISISTAT 35W (STAPLE) ×3 IMPLANT
SUCTION POOLE HANDLE (INSTRUMENTS) ×3
SURGILUBE 2OZ TUBE FLIPTOP (MISCELLANEOUS) IMPLANT
SUT PDS AB 1 TP1 54 (SUTURE) ×6 IMPLANT
SUT PDS AB 1 TP1 96 (SUTURE) ×6 IMPLANT
SUT PROLENE 2 0 CT2 30 (SUTURE) IMPLANT
SUT PROLENE 2 0 KS (SUTURE) IMPLANT
SUT SILK 2 0 SH CR/8 (SUTURE) ×3 IMPLANT
SUT SILK 2 0 TIES 10X30 (SUTURE) ×3 IMPLANT
SUT SILK 3 0 SH CR/8 (SUTURE) ×7 IMPLANT
SUT SILK 3 0 TIES 10X30 (SUTURE) ×3 IMPLANT
SUT VIC AB 3-0 SH 18 (SUTURE) ×4 IMPLANT
SUT VIC AB 3-0 SH 27 (SUTURE) ×6
SUT VIC AB 3-0 SH 27X BRD (SUTURE) IMPLANT
TOWEL GREEN STERILE (TOWEL DISPOSABLE) ×3 IMPLANT
TRAY FOLEY MTR SLVR 16FR STAT (SET/KITS/TRAYS/PACK) ×3 IMPLANT
TUBE CONNECTING 12'X1/4 (SUCTIONS) ×2
TUBE CONNECTING 12X1/4 (SUCTIONS) ×4 IMPLANT
YANKAUER SUCT BULB TIP NO VENT (SUCTIONS) IMPLANT

## 2019-06-30 NOTE — Progress Notes (Signed)
Patient returned to room from PACU. Dressing has some slight serosanguinous drainage on it, sections outlined. Patient is having pain, however just received pain medications. He was instructed that he can have pain medication again at noon. Ice pack given for comfort. Will continue to monitor.

## 2019-06-30 NOTE — Anesthesia Procedure Notes (Signed)
Procedure Name: Intubation Date/Time: 06/30/2019 7:38 AM Performed by: Kyung Rudd, CRNA Pre-anesthesia Checklist: Patient identified, Emergency Drugs available, Suction available and Patient being monitored Patient Re-evaluated:Patient Re-evaluated prior to induction Oxygen Delivery Method: Circle system utilized Preoxygenation: Pre-oxygenation with 100% oxygen Induction Type: IV induction Ventilation: Mask ventilation without difficulty Laryngoscope Size: Mac and 4 Grade View: Grade I Tube type: Oral Tube size: 7.5 mm Number of attempts: 1 Airway Equipment and Method: Stylet Placement Confirmation: ETT inserted through vocal cords under direct vision,  positive ETCO2 and breath sounds checked- equal and bilateral Secured at: 22 cm Tube secured with: Tape Dental Injury: Teeth and Oropharynx as per pre-operative assessment

## 2019-06-30 NOTE — Consult Note (Signed)
[facility] Nurse ostomy follow up Patient did not receive an intraoperative ostomy today in surgery.  Many nursing team will not follow, but will remain available to this patient, the nursing and medical teams.  Please re-consult if needed. Thanks, Maudie Flakes, MSN, RN, [place], Arther Abbott  Pager# [phone]

## 2019-06-30 NOTE — Progress Notes (Signed)
PROGRESS NOTE  MCGUIRE DRESEL [id number] DOB: 09-15-1950   PCP: Medicine, [facility]  Patient is from: Home  DOA: 06/26/2019 LOS: 3  Brief Narrative / Interim history: 68 year old male with history of combined CHF (30 to 35%), asthma/COPD, solitary kidney, HTN, HLD, EtOH dependence, former tobacco and cocaine abuse and depression,  Presented 11/29 with intermittent nausea, vomiting and abdominal pain for about 5 months this that has gotten worse over the last 1 week prior to presentation.  In ED, HDS.  CT abdomen and pelvis revealed circumferential wall thickening within the distal transverse colon concerning for annular apple core malignancy.  GI consulted.  Had colonoscopy on 06/27/2019 concerning for malignant partially obstructing tumor in the distal transverse colon.  Biopsy obtained.  Per GI, pathology consistent with adenocarcinoma.  General surgery consulted. Plan is for exlaparotomy and colon resection 06/29/2019. Echo with EF of 60 to 65% and no other significant finding.  Cleared by advanced heart failure team for surgery.  He was taken to the OR by Dr. Rosendo Gros, surgery on 12/3.Marland Kitchen  Intraoperatively, found to have large mid transverse colon mass with bulky lymph nodes at the base of mesentery.  This was excised with 10 cm margins on either side followed by colo-colo anastomosis.   Subjective: Just returned from surgery.  Reports abdominal pain.  No other complaints.  He denies chest pain, dyspnea or nausea.  He is anxious about the result.  Objective: Vitals:   06/30/19 0953 06/30/19 1008 06/30/19 1023 06/30/19 1036  BP: (!) 142/80 (!) 151/73 (!) 141/71 (!) 141/81  Pulse: 63 66 62 63  Resp: 12 16 15 19   Temp:    (!) 97.4 F (36.3 C)  TempSrc:    Oral  SpO2: 100% 99% 100% 99%  Weight:      Height:        Intake/Output Summary (Last 24 hours) at 06/30/2019 1141 Last data filed at 06/30/2019 0928 Gross per 24 hour  Intake 720 ml  Output 1300 ml  Net  -580 ml   Filed Weights   06/27/19 1158 06/28/19 0500 06/29/19 0500  Weight: 64 kg 64.2 kg 64.2 kg    Examination:  GENERAL: No acute distress.  Appears well.  HEENT: MMM.  Vision and hearing grossly intact.  NECK: Supple.  No apparent JVD.  RESP:  No IWOB. Good air movement bilaterally. CVS:  RRR. Heart sounds normal.  ABD/GI/GU: Honeycomb dressing over laparotomy wound with minimal blood staining MSK/EXT:  Moves extremities. No apparent deformity or edema.  SKIN: no apparent skin lesion or wound NEURO: Awake, alert and oriented appropriately.  No apparent focal neuro deficit. PSYCH: Calm. Normal affect.  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 -Echo basically normal now (previously 30 to 35%) -Cleared by advanced HF team -Underwent transverse colon resection with colo-colo anastomosis by Dr. Rosendo Gros on 06/30/2019. -N.p.o. except ice chips and sips with meds until bowel function returns. -Gentle IV fluid -Pain control.  Intermittent nausea/vomiting/abdominal pain: Due to the above.  No further nausea or vomiting.  -Management as above.  Chronic systolic CHF now with normal EF: Echo with EF of 60 to 65% (previously 30 to 35%).  No cardiopulmonary symptoms.  Appears euvolemic.  UOP about 1 L / 24 hours.  Renal function improved. -Discontinued losartan and Aldactone in the setting of worsening renal function. -Continue Coreg, BiDil and low-dose Lasix -Monitor fluid status, renal function and electrolytes.  History of PVC: NSR now.  Echo  as above. -Continue low-dose amiodarone  CKD-3a: Baseline Cr 1.4-1.6. Stable -Cr 1.58 (admit)> 1.26> 1.58> 1.39 -Holding losartan and Aldactone as above. -Continue monitoring  Iron deficiency anemia: Iron saturation 5%. -Hgb 12-13 (baseline)> 11.5 (admit)> 9.4> 9.7> 10.6 -Received IV Feraheme on 11/29. -P.o. ferrous sulfate  Leukocytosis/bandemia: suspect demargination.  No obvious  signs of infection.  Improving. -Continue trending  Essential hypertension: Normotensive. -Cardiac meds as above  Chronic asthma/COPD: Stable.  -Continue albuterol and Dulera   Nutrition Problem: Moderate Malnutrition Etiology: chronic illness(ETOH dependence, CHF, colon mass with probable colon cancer)  Signs/Symptoms: energy intake < 75% for > or equal to 1 month, mild fat depletion, moderate fat depletion, mild muscle depletion, moderate muscle depletion  Interventions: Ensure Enlive (each supplement provides 350kcal and 20 grams of protein), MVI, Magic cup   DVT prophylaxis: Subcu Lovenox Code Status: Full code Family Communication: Patient and/or RN. Available if any question. Disposition Plan: Remains inpatient Consultants: GI (signed off), general surgery, advanced HF  Procedures:  11/30-colonoscopy concerning for distal transverse colon cancer 12/3-Underwent transverse colon resection with colo-colo anastomosis by Dr. Rosendo Gros on 06/30/2019.  Microbiology summarized: COVID-19 screen negative.  Sch Meds:  Scheduled Meds: . amiodarone  100 mg Oral Daily  . carvedilol  3.125 mg Oral BID WC  . Chlorhexidine Gluconate Cloth  6 each Topical Daily  . [START ON 07/01/2019] enoxaparin (LOVENOX) injection  30 mg Subcutaneous Q24H  . feeding supplement  237 mL Oral BID BM  . [START ON 07/04/2019] ferrous sulfate  325 mg Oral BID WC  . fluticasone  2 spray Each Nare Daily  . furosemide  20 mg Oral Daily  . gabapentin  300 mg Oral BID  . HYDROmorphone      . HYDROmorphone      . isosorbide-hydrALAZINE  0.5 tablet Oral TID  . loratadine  10 mg Oral Daily  . mometasone-formoterol  2 puff Inhalation BID  . montelukast  10 mg Oral QHS  . oxyCODONE       Continuous Infusions: . dextrose 5 % and 0.9 % NaCl with KCl 20 mEq/L     PRN Meds:.acetaminophen **OR** acetaminophen, albuterol, diphenhydrAMINE **OR** diphenhydrAMINE, HYDROmorphone (DILAUDID) injection, ondansetron **OR**  ondansetron (ZOFRAN) IV, oxyCODONE, pneumococcal 23 valent vaccine  Antimicrobials: Anti-infectives (From admission, onward)   Start     Dose/Rate Route Frequency Ordered Stop   06/30/19 0700  cefoTEtan (CEFOTAN) 2 g in sodium chloride 0.9 % 100 mL IVPB     2 g 200 mL/hr over 30 Minutes Intravenous On call to O.R. 06/30/19 [id number] 06/30/19 0755   06/29/19 1400  neomycin (MYCIFRADIN) tablet 1,000 mg     1,000 mg Oral 3 times per day 06/29/19 0901 06/29/19 2218   06/29/19 1400  metroNIDAZOLE (FLAGYL) tablet 1,000 mg     1,000 mg Oral 3 times per day 06/29/19 0901 06/29/19 2218   06/29/19 1000  cefoTEtan (CEFOTAN) 2 g in sodium chloride 0.9 % 100 mL IVPB     2 g 200 mL/hr over 30 Minutes Intravenous On call to O.R. 06/29/19 0901 06/30/19 0559       I have personally reviewed the following labs and images: CBC: Recent Labs  Lab 06/26/19 0624 06/27/19 0227 06/28/19 0215 06/29/19 0334 06/30/19 1049  WBC 16.9* 9.3 11.0* 16.9* 15.9*  NEUTROABS  --  6.3 8.3* 13.1*  --   HGB 11.6* 9.4* 9.4* 9.7* 10.6*  HCT 36.5* 29.8* 29.7* 29.7* 33.2*  MCV 78.5* 77.8* 76.7* 76.9* 77.0*  PLT 486* 325  319 293 382   BMP &GFR Recent Labs  Lab 06/26/19 0055 06/26/19 0624 06/27/19 0227 06/28/19 0215 06/29/19 0334 06/30/19 0137 06/30/19 1049  NA 141 140 136 137 136  --   --   K 3.3* 3.5 3.2* 3.7 3.8  --   --   CL 105 103 102 104 101  --   --   CO2 22 26 23 24 23   --   --   GLUCOSE 126* 119* 107* 91 108*  --   --   BUN 31* 26* 19 12 14   --   --   CREATININE 1.58* 1.25* 1.33* 1.26* 1.58*  --  1.39*  CALCIUM 9.1 8.9 8.3* 8.3* 8.3*  --   --   MG  --   --   --   --  2.0 2.2  --    Estimated Creatinine Clearance: 47.5 mL/min (A) (by C-G formula based on SCr of 1.39 mg/dL (H)). Liver & Pancreas: Recent Labs  Lab 06/26/19 0055 06/26/19 0624  AST 16 18  ALT 20 21  ALKPHOS 72 76  BILITOT 0.3 0.5  PROT 7.1 6.7  ALBUMIN 3.5 3.4*   Recent Labs  Lab 06/26/19 0055  LIPASE 24   No results for  input(s): AMMONIA in the last 168 hours. Diabetic: No results for input(s): HGBA1C in the last 72 hours. No results for input(s): GLUCAP in the last 168 hours. Cardiac Enzymes: No results for input(s): CKTOTAL, CKMB, CKMBINDEX, TROPONINI in the last 168 hours. No results for input(s): PROBNP in the last 8760 hours. Coagulation Profile: Recent Labs  Lab 06/26/19 0152  INR 0.9   Thyroid Function Tests: No results for input(s): TSH, T4TOTAL, FREET4, T3FREE, THYROIDAB in the last 72 hours. Lipid Profile: No results for input(s): CHOL, HDL, LDLCALC, TRIG, CHOLHDL, LDLDIRECT in the last 72 hours. Anemia Panel: No results for input(s): VITAMINB12, FOLATE, FERRITIN, TIBC, IRON, RETICCTPCT in the last 72 hours. Urine analysis:    Component Value Date/Time   COLORURINE STRAW (A) 06/26/2019 0051   APPEARANCEUR CLEAR 06/26/2019 0051   LABSPEC 1.025 06/26/2019 0051   PHURINE 6.0 06/26/2019 0051   GLUCOSEU NEGATIVE 06/26/2019 0051   HGBUR NEGATIVE 06/26/2019 0051   BILIRUBINUR NEGATIVE 06/26/2019 [place] 06/26/2019 0051   PROTEINUR NEGATIVE 06/26/2019 0051   UROBILINOGEN 1.0 07/16/2013 1404   NITRITE NEGATIVE 06/26/2019 0051   LEUKOCYTESUR NEGATIVE 06/26/2019 0051   Sepsis Labs: Invalid input(s): PROCALCITONIN, [place]  Microbiology: Recent Results (from the past 240 hour(s))  SARS CORONAVIRUS 2 (TAT 6-24 HRS) Nasopharyngeal Urine, Clean Catch     Status: None   Collection Time: 06/26/19  3:22 AM   Specimen: Urine, Clean Catch; Nasopharyngeal  Result Value Ref Range Status   SARS Coronavirus 2 NEGATIVE NEGATIVE Final    Comment: (NOTE) SARS-CoV-2 target nucleic acids are NOT DETECTED. The SARS-CoV-2 RNA is generally detectable in upper and lower respiratory specimens during the acute phase of infection. Negative results do not preclude SARS-CoV-2 infection, do not rule out co-infections with other pathogens, and should not be used as the sole basis for  treatment or other patient management decisions. Negative results must be combined with clinical observations, patient history, and epidemiological information. The expected result is Negative. Fact Sheet for Patients: SugarRoll.be Fact Sheet for Healthcare Providers: https://www.woods-mathews.com/ This test is not yet approved or cleared by the [place] FDA and  has been authorized for detection and/or diagnosis of SARS-CoV-2 by FDA under an Emergency Use Authorization (EUA).  This EUA will remain  in effect (meaning this test can be used) for the duration of the COVID-19 declaration under Section 56 4(b)(1) of the Act, 21 U.S.C. section 360bbb-3(b)(1), unless the authorization is terminated or revoked sooner. Performed at [facility], [place] [address], [place], [place] [address]   Surgical pcr screen     Status: None   Collection Time: 06/28/19  3:05 PM   Specimen: Nasal Mucosa; Nasal Swab  Result Value Ref Range Status   MRSA, PCR NEGATIVE NEGATIVE Final   Staphylococcus aureus NEGATIVE NEGATIVE Final    Comment: (NOTE) The Xpert SA Assay (FDA approved for NASAL specimens in patients 78 years of age and older), is one component of a comprehensive surveillance program. It is not intended to diagnose infection nor to guide or monitor treatment. Performed at [facility], [place] [address] [place], [place] [address]     Radiology Studies: No results found.    T. Calmar  If 7PM-7AM, please contact night-coverage [email or web address] Password [email or web address] 06/30/2019, 11:41 AM

## 2019-06-30 NOTE — Op Note (Signed)
06/30/2019  9:19 AM  PATIENT:  Joe Garcia  68 y.o. male  PRE-OPERATIVE DIAGNOSIS:  ADENOCARCINOMA TRANSVERSE COLON  POST-OPERATIVE DIAGNOSIS:  ADENOCARCINOMA TRANSVERSE COLON  PROCEDURE:  Procedure(s): EXPLORATORY LAPAROTOMY (N/A) TRANSVERSE COLECTOMY SPLENIC FLEXURE MOBILIZATION  SURGEON:  Surgeon(s) and Role:    Ralene Ok, MD - Primary  ASSISTANTS: Modena Jansky, PA-C   ANESTHESIA:   general  EBL:  50 mL   BLOOD ADMINISTERED:none  DRAINS: none   LOCAL MEDICATIONS USED:  NONE  SPECIMEN:  Source of Specimen:  Transverse colon with tattoo on mass  DISPOSITION OF SPECIMEN:  PATHOLOGY  COUNTS:  YES  TOURNIQUET:  * No tourniquets in log *  DICTATION: .Dragon Dictation Indication procedure: Patient is a 68 year old male with a history of bowel obstruction secondary to transverse colon mass.  Patient underwent colonoscopy which revealed a large near obliterating transverse colon mass.  This was tattooed.  Patient was taken to the operating for colon resection.  Findings: Patient had a large mid transverse colon mass with bulky lymph nodes at the base of the mesentery.  This was excised with 10 cm margins on either side.  A 2 layer handsewn anastomosis was done of the colocolo-anastomosis.  The mesentery was reapproximated.  Details of procedure: As the patient was consented he was taken back to the OR and placed in the supine position with bilateral SCDs in place.  He underwent general trach intubation.  Patient was then prepped and draped in a sterile fashion.  A timeout was called and all facts verified.  A #10 blade was used to make a midline incision.  Cautery was used to maintain hemostasis and dissection was taken down to the midline fascia.  The peritoneum was then entered bluntly.  The fascia was then lengthened to the length of the skin incision.  At this time a Bookwalter retractor was placed to help with abdominal wall retraction.  At this time I  was easily able to palpate the transverse colon mass.  This appeared to be within the midportion of the transverse colon.  I will palpated the liver which appear to be free of any masses.  There were no other masses are palpated in the proximal transverse colon or the right colon down to the ileocecal valve.  At this time I was able to mobilize the left descending colon by incising the white line of Toldt.  This was dissected proximally into the splenic flexure.  This allowed me to medialize the left colon.  I proceeded with dissecting off the omentum from the transverse colon.  I worked towards splenic flexure.  This allowed me to mobilize the entire splenic flexure inferiorly.  The omentum was then taken off the transverse colon towards the hepatic flexure.  The hepatic flexure was tethered down some.  This was also mobilized.  At this time I was able to freely mobilize the transverse colon with the mesentery.  Approximately 10 cm proximal to the palpable mass admit a mesenteric window.  A 75 GIA stapler was used to transect the colon proximally.  I was able to visualize a branch of the middle colic artery.  This was just distal to this branch.  Another portion of the transverse colon approximately 10 cm distal to the mass was chosen.  A mesenteric window was made and a 75 GIA stapler was used to transect the colon distally.  I then proceeded to use a LigaSure device to ligate the mesentery down to the base.  There  were bulky mesenteric vessels at the base.  These were also obtained and sent with the specimen.  At this time I decided to handsewn the anastomosis.  The transverse colon was easily reapproximated in an antecolic fashion.  3 oh silks were used to create a posterior interrupted layer.  Colotomies were made on each end of the colon.  This was then reapproximated using a second layer of running 3-0 Vicryl's x2.  Outer 3 oh silks were used to reapproximate the anterior layer.  The anastomosis was  patent.  The mesentery was then reapproximated using figure-of-eight 3 oh silks.  At this time the abdominal cavity was irrigated out with sterile saline.  The Bookwalter retractor was removed.  At this time we proceeded with the colon protocol and change from dirty to clean.  At this time the mesentery that was left was brought down to the midline.  The midline fascia was then reapproximated using #1 PDS in a standard running fashion x2.  The subcutaneous tissue was irrigated out with sterile saline.  The skin was reapproximated using staples.  The patient tired procedure well was taken to the recovery in stable condition.   PLAN OF CARE: Admit to inpatient   PATIENT DISPOSITION:  PACU - hemodynamically stable.   Delay start of Pharmacological VTE agent (>24hrs) due to surgical blood loss or risk of bleeding: no

## 2019-06-30 NOTE — Transfer of Care (Signed)
Immediate Anesthesia Transfer of Care Note  Patient: Joe Garcia  Procedure(s) Performed: EXPLORATORY LAPAROTOMY (N/A Abdomen) PARTIAL COLECTOMY (N/A Abdomen)  Patient Location: PACU  Anesthesia Type:General  Level of Consciousness: awake, alert  and oriented  Airway & Oxygen Therapy: Patient Spontanous Breathing  Post-op Assessment: Report given to RN, Post -op Vital signs reviewed and stable and Patient moving all extremities X 4  Post vital signs: Reviewed and stable  Last Vitals:  Vitals Value Taken Time  BP 145/84 06/30/19 0938  Temp 36.5 C 06/30/19 0938  Pulse 64 06/30/19 0940  Resp 13 06/30/19 0940  SpO2 100 % 06/30/19 0940  Vitals shown include unvalidated device data.  Last Pain:  Vitals:   06/30/19 0524  TempSrc: Oral  PainSc:       Patients Stated Pain Goal: 3 ([id number])  Complications: No apparent anesthesia complications

## 2019-06-30 NOTE — Anesthesia Postprocedure Evaluation (Signed)
Anesthesia Post Note  Patient: Joe Garcia  Procedure(s) Performed: EXPLORATORY LAPAROTOMY (N/A Abdomen) PARTIAL COLECTOMY (N/A Abdomen)     Patient location during evaluation: PACU Anesthesia Type: General Level of consciousness: awake and alert and oriented Pain management: pain level controlled Vital Signs Assessment: post-procedure vital signs reviewed and stable Respiratory status: spontaneous breathing, nonlabored ventilation and respiratory function stable Cardiovascular status: blood pressure returned to baseline Postop Assessment: no apparent nausea or vomiting Anesthetic complications: no    Last Vitals:  Vitals:   06/30/19 1023 06/30/19 1036  BP: (!) 141/71 (!) 141/81  Pulse: 62 63  Resp: 15 19  Temp:  (!) 36.3 C  SpO2: 100% 99%    Last Pain:  Vitals:   06/30/19 1413  TempSrc:   PainSc: [place]

## 2019-07-01 ENCOUNTER — Encounter (HOSPITAL_COMMUNITY): Payer: Self-pay | Admitting: General Surgery

## 2019-07-01 DIAGNOSIS — K219 Gastro-esophageal reflux disease without esophagitis: Secondary | ICD-10-CM

## 2019-07-01 DIAGNOSIS — R1013 Epigastric pain: Secondary | ICD-10-CM

## 2019-07-01 LAB — BASIC METABOLIC PANEL
Anion gap: 8 (ref 5–15)
BUN: 10 mg/dL (ref 8–23)
CO2: 22 mmol/L (ref 22–32)
Calcium: 8.2 mg/dL — ABNORMAL LOW (ref 8.9–10.3)
Chloride: 107 mmol/L (ref 98–111)
Creatinine, Ser: 1.32 mg/dL — ABNORMAL HIGH (ref 0.61–1.24)
GFR calc Af Amer: >60 mL/min (ref >60)
GFR calc non Af Amer: 56 mL/min — ABNORMAL LOW (ref >60)
Glucose, Bld: 161 mg/dL — ABNORMAL HIGH (ref 70–99)
Potassium: 5 mmol/L (ref 3.5–5.1)
Sodium: 137 mmol/L (ref 135–145)

## 2019-07-01 LAB — CBC
HCT: 29.4 % — ABNORMAL LOW (ref 39.0–52.0)
Hemoglobin: 9.4 g/dL — ABNORMAL LOW (ref 13.0–17.0)
MCH: 24.7 pg — ABNORMAL LOW (ref 26.0–34.0)
MCHC: 32 g/dL (ref 30.0–36.0)
MCV: 77.4 fL — ABNORMAL LOW (ref 80.0–100.0)
Platelets: 343 10*3/uL (ref 150–400)
RBC: 3.8 MIL/uL — ABNORMAL LOW (ref 4.22–5.81)
RDW: 16.3 % — ABNORMAL HIGH (ref 11.5–15.5)
WBC: 14 10*3/uL — ABNORMAL HIGH (ref 4.0–10.5)
nRBC: 0 % (ref 0.0–0.2)

## 2019-07-01 LAB — MAGNESIUM: Magnesium: 2.1 mg/dL (ref 1.7–2.4)

## 2019-07-01 LAB — SURGICAL PATHOLOGY

## 2019-07-01 MED ORDER — ALUM & MAG HYDROXIDE-SIMETH 200-200-20 MG/5ML PO SUSP
30.0000 mL | Freq: Three times a day (TID) | ORAL | Status: DC | PRN
  Administered 2019-07-01: 19:00:00 30 mL via ORAL
  Filled 2019-07-01: qty 30

## 2019-07-01 MED ORDER — PANTOPRAZOLE SODIUM 40 MG PO TBEC
40.0000 mg | DELAYED_RELEASE_TABLET | Freq: Every day | ORAL | Status: DC
  Administered 2019-07-01 – 2019-07-04 (×4): 40 mg via ORAL
  Filled 2019-07-01 (×4): qty 1

## 2019-07-01 NOTE — Discharge Instructions (Signed)
[facility]      [facility] [phone]  OPEN ABDOMINAL SURGERY: POST OP INSTRUCTIONS  Always review your discharge instruction sheet given to you by the facility where your surgery was performed.  IF YOU HAVE DISABILITY OR FAMILY LEAVE FORMS, YOU MUST BRING THEM TO THE OFFICE FOR PROCESSING.  PLEASE DO NOT GIVE THEM TO YOUR DOCTOR.  1. A prescription for pain medication may be given to you upon discharge.  Take your pain medication as prescribed, if needed.  If narcotic pain medicine is not needed, then you may take acetaminophen (Tylenol) or ibuprofen (Advil) as needed. 2. Take your usually prescribed medications unless otherwise directed. 3. If you need a refill on your pain medication, please contact your pharmacy. They will contact our office to request authorization.  Prescriptions will not be filled after 5pm or on week-ends. 4. You should follow a light diet the first few days after arrival home, such as soup and crackers, pudding, etc.unless your doctor has advised otherwise. A high-fiber, low fat diet can be resumed as tolerated.   Be sure to include lots of fluids daily. Most patients will experience some swelling and bruising on the chest and neck area.  Ice packs will help.  Swelling and bruising can take several days to resolve 5. Most patients will experience some swelling and bruising in the area of the incision. Ice pack will help. Swelling and bruising can take several days to resolve..  6. It is common to experience some constipation if taking pain medication after surgery.  Increasing fluid intake and taking a stool softener will usually help or prevent this problem from occurring.  A mild laxative (Milk of Magnesia or Miralax) should be taken according to package directions if there are no bowel movements after 48 hours. 7.  You may have steri-strips (small skin tapes) in place directly over the incision.  These strips should be left on the skin for 7-10 days.  If your  surgeon used skin glue on the incision, you may shower in 24 hours.  The glue will flake off over the next 2-3 weeks.  Any sutures or staples will be removed at the office during your follow-up visit. You may find that a light gauze bandage over your incision may keep your staples from being rubbed or pulled. You may shower and replace the bandage daily. 8. ACTIVITIES:  You may resume regular (light) daily activities beginning the next day--such as daily self-care, walking, climbing stairs--gradually increasing activities as tolerated.  You may have sexual intercourse when it is comfortable.  Refrain from any heavy lifting or straining until approved by your doctor. a. You may drive when you no longer are taking prescription pain medication, you can comfortably wear a seatbelt, and you can safely maneuver your car and apply brakes  9. You should see your doctor in the office for a follow-up appointment approximately two weeks after your surgery.  Make sure that you call for this appointment within a day or two after you arrive home to insure a convenient appointment time.  WHEN TO CALL YOUR DOCTOR: 1. Fever over 101.0 2. Inability to urinate 3. Nausea and/or vomiting 4. Extreme swelling or bruising 5. Continued bleeding from incision. 6. Increased pain, redness, or drainage from the incision. 7. Difficulty swallowing or breathing 8. Muscle cramping or spasms. 9. Numbness or tingling in hands or feet or around lips.  The clinic staff is available to answer your questions during regular business hours.  Please   don't hesitate to call and ask to speak to one of the nurses if you have concerns.  For further questions, please visit [email or web address]   

## 2019-07-01 NOTE — Progress Notes (Signed)
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  In ED, HDS.  CT abdomen and pelvis revealed circumferential wall thickening within the distal transverse colon concerning for annular apple core malignancy.  GI consulted.  Had colonoscopy on 06/27/2019 concerning for malignant partially obstructing tumor in the distal transverse colon.  Biopsy obtained.  Per GI, pathology consistent with adenocarcinoma.  General surgery consulted. Plan is for exlaparotomy and colon resection 06/29/2019. Echo with EF of 60 to 65% and no other significant finding.  Cleared by advanced heart failure team for surgery.  He was taken to the OR by Dr. Rosendo Gros, surgery on 12/3.Marland Kitchen  Intraoperatively, found to have large mid transverse colon mass with bulky lymph nodes at the base of mesentery.  This was excised with 10 cm margins on either side followed by colo-colo anastomosis.   Subjective: Reports abdominal pain mainly over epigastric area.  He rates his pain 6/10.  Reports chest pain but points at epigastric region.  He also reports shortness of breath overnight.  Denies UTI symptoms.  Passing gases.  Has not had bowel movement yet.  Objective: Vitals:   07/01/19 0436 07/01/19 0500 07/01/19 0748 07/01/19 0820  BP: 131/72  (!) 148/77   Pulse: 63  66   Resp: 17     Temp: 98.1 F (36.7 C)     TempSrc: Oral     SpO2: 99%   96%  Weight:  70.1 kg    Height:        Intake/Output Summary (Last 24 hours) at 07/01/2019 1625 Last data filed at 07/01/2019  1400 Gross per 24 hour  Intake 1716.38 ml  Output 1350 ml  Net 366.38 ml   Filed Weights   06/28/19 0500 06/29/19 0500 07/01/19 0500  Weight: 64.2 kg 64.2 kg 70.1 kg    Examination:  GENERAL: No acute distress.  Appears well.  HEENT: MMM.  Vision and hearing grossly intact.  NECK: Supple.  No apparent JVD.  RESP:  No IWOB. Good air movement bilaterally. CVS:  RRR. Heart sounds normal.  ABD/GI/GU: Bowel sounds present. Soft.  Honeycomb dressing.  Bloodstained within marked area. MSK/EXT:  Moves extremities. No apparent deformity or edema.  SKIN: no apparent skin lesion or wound NEURO: Awake, alert and oriented appropriately.  No apparent focal neuro deficit. PSYCH: Calm. Normal affect.  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 -Echo basically normal now (previously 30 to 35%) -Underwent transverse colon resection with colo-colo anastomosis by Dr. Rosendo Gros on 06/30/2019. -Started on clear liquid diet -Discontinue IV fluid -Pain control.  Epigastric pain/GERD. -Started Protonix and as needed GI cocktail  Intermittent nausea/vomiting/abdominal pain: likely due to adenocarcinoma and GERD. -Management as above  Chronic systolic CHF now with normal EF: Echo with EF of 60 to 65% (previously 30 to 35%).  No cardiopulmonary symptoms.  Appears euvolemic.  UOP about 1.2 L / 24 hours.  Renal function improved. -Losartan and Aldactone discontinued due to worsening renal function -Continue Coreg, BiDil and low-dose Lasix -Monitor fluid status, renal function and electrolytes. -  Discontinue IV fluid.  History of PVC: NSR now.  Echo as above. -Continue low-dose amiodarone  CKD-3a: Baseline Cr 1.4-1.6. Stable -Cr 1.58 (admit)> 1.26> 1.58> 1.39> 1.32 -Holding losartan and Aldactone as above. -Continue monitoring  Iron deficiency anemia: Iron saturation 5%. -Hgb 12-13 (baseline)> 11.5 (admit)> 9.4>> 9.4 -Received IV Feraheme on  11/29. -P.o. ferrous sulfate  Leukocytosis/bandemia: suspect demargination.  No obvious signs of infection.  Improving. -Continue trending  Essential hypertension: Normotensive. -Cardiac meds as above  Chronic asthma/COPD: Stable.  -Continue albuterol and Dulera   Nutrition Problem: Moderate Malnutrition Etiology: chronic illness(ETOH dependence, CHF, colon mass with probable colon cancer)  Signs/Symptoms: energy intake < 75% for > or equal to 1 month, mild fat depletion, moderate fat depletion, mild muscle depletion, moderate muscle depletion  Interventions: Ensure Enlive (each supplement provides 350kcal and 20 grams of protein), MVI, Magic cup   DVT prophylaxis: Subcu Lovenox Code Status: Full code Family Communication: Patient and/or RN. Available if any question. Disposition Plan: Remains inpatient Consultants: GI (signed off), general surgery, advanced HF  Procedures:  11/30-colonoscopy concerning for distal transverse colon cancer 12/3-Underwent transverse colon resection with colo-colo anastomosis by Dr. Rosendo Gros on 06/30/2019.  Microbiology summarized: COVID-19 screen negative.  Sch Meds:  Scheduled Meds: . amiodarone  100 mg Oral Daily  . carvedilol  3.125 mg Oral BID WC  . Chlorhexidine Gluconate Cloth  6 each Topical Daily  . enoxaparin (LOVENOX) injection  30 mg Subcutaneous Q24H  . feeding supplement  237 mL Oral BID BM  . [START ON 07/04/2019] ferrous sulfate  325 mg Oral BID WC  . fluticasone  2 spray Each Nare Daily  . furosemide  20 mg Oral Daily  . gabapentin  300 mg Oral BID  . isosorbide-hydrALAZINE  0.5 tablet Oral TID  . loratadine  10 mg Oral Daily  . mometasone-formoterol  2 puff Inhalation BID  . montelukast  10 mg Oral QHS  . pantoprazole  40 mg Oral Daily   Continuous Infusions:  PRN Meds:.acetaminophen **OR** acetaminophen, albuterol, alum & mag hydroxide-simeth, diphenhydrAMINE **OR** diphenhydrAMINE, HYDROmorphone (DILAUDID) injection,  ondansetron **OR** ondansetron (ZOFRAN) IV, oxyCODONE, pneumococcal 23 valent vaccine  Antimicrobials: Anti-infectives (From admission, onward)   Start     Dose/Rate Route Frequency Ordered Stop   06/30/19 0700  cefoTEtan (CEFOTAN) 2 g in sodium chloride 0.9 % 100 mL IVPB     2 g 200 mL/hr over 30 Minutes Intravenous On call to O.R. 06/30/19 [id number] 06/30/19 0755   06/29/19 1400  neomycin (MYCIFRADIN) tablet 1,000 mg     1,000 mg Oral 3 times per day 06/29/19 0901 06/29/19 2218   06/29/19 1400  metroNIDAZOLE (FLAGYL) tablet 1,000 mg     1,000 mg Oral 3 times per day 06/29/19 0901 06/29/19 2218   06/29/19 1000  cefoTEtan (CEFOTAN) 2 g in sodium chloride 0.9 % 100 mL IVPB     2 g 200 mL/hr over 30 Minutes Intravenous On call to O.R. 06/29/19 0901 06/30/19 0559       I have personally reviewed the following labs and images: CBC: Recent Labs  Lab 06/27/19 0227 06/28/19 0215 06/29/19 0334 06/30/19 1049 07/01/19 0200  WBC 9.3 11.0* 16.9* 15.9* 14.0*  NEUTROABS 6.3 8.3* 13.1*  --   --   HGB 9.4* 9.4* 9.7* 10.6* 9.4*  HCT 29.8* 29.7* 29.7* 33.2* 29.4*  MCV 77.8* 76.7* 76.9* 77.0* 77.4*  PLT 325 319 293 382 343   BMP &GFR Recent Labs  Lab 06/26/19 0624 06/27/19 0227 06/28/19  0215 06/29/19 0334 06/30/19 0137 06/30/19 1049 07/01/19 0200  NA 140 136 137 136  --   --  137  K 3.5 3.2* 3.7 3.8  --   --  5.0  CL 103 102 104 101  --   --  107  CO2 26 23 24 23   --   --  22  GLUCOSE 119* 107* 91 108*  --   --  161*  BUN 26* 19 12 14   --   --  10  CREATININE 1.25* 1.33* 1.26* 1.58*  --  1.39* 1.32*  CALCIUM 8.9 8.3* 8.3* 8.3*  --   --  8.2*  MG  --   --   --  2.0 2.2  --  2.1   Estimated Creatinine Clearance: 54.6 mL/min (A) (by C-G formula based on SCr of 1.32 mg/dL (H)). Liver & Pancreas: Recent Labs  Lab 06/26/19 0055 06/26/19 0624  AST 16 18  ALT 20 21  ALKPHOS 72 76  BILITOT 0.3 0.5  PROT 7.1 6.7  ALBUMIN 3.5 3.4*   Recent Labs  Lab 06/26/19 0055  LIPASE 24   No  results for input(s): AMMONIA in the last 168 hours. Diabetic: No results for input(s): HGBA1C in the last 72 hours. No results for input(s): GLUCAP in the last 168 hours. Cardiac Enzymes: No results for input(s): CKTOTAL, CKMB, CKMBINDEX, TROPONINI in the last 168 hours. No results for input(s): PROBNP in the last 8760 hours. Coagulation Profile: Recent Labs  Lab 06/26/19 0152  INR 0.9   Thyroid Function Tests: No results for input(s): TSH, T4TOTAL, FREET4, T3FREE, THYROIDAB in the last 72 hours. Lipid Profile: No results for input(s): CHOL, HDL, LDLCALC, TRIG, CHOLHDL, LDLDIRECT in the last 72 hours. Anemia Panel: No results for input(s): VITAMINB12, FOLATE, FERRITIN, TIBC, IRON, RETICCTPCT in the last 72 hours. Urine analysis:    Component Value Date/Time   COLORURINE STRAW (A) 06/26/2019 0051   APPEARANCEUR CLEAR 06/26/2019 0051   LABSPEC 1.025 06/26/2019 0051   PHURINE 6.0 06/26/2019 0051   GLUCOSEU NEGATIVE 06/26/2019 0051   HGBUR NEGATIVE 06/26/2019 0051   BILIRUBINUR NEGATIVE 06/26/2019 [place] 06/26/2019 0051   PROTEINUR NEGATIVE 06/26/2019 0051   UROBILINOGEN 1.0 07/16/2013 1404   NITRITE NEGATIVE 06/26/2019 0051   LEUKOCYTESUR NEGATIVE 06/26/2019 0051   Sepsis Labs: Invalid input(s): PROCALCITONIN, [place]  Microbiology: Recent Results (from the past 240 hour(s))  SARS CORONAVIRUS 2 (TAT 6-24 HRS) Nasopharyngeal Urine, Clean Catch     Status: None   Collection Time: 06/26/19  3:22 AM   Specimen: Urine, Clean Catch; Nasopharyngeal  Result Value Ref Range Status   SARS Coronavirus 2 NEGATIVE NEGATIVE Final    Comment: (NOTE) SARS-CoV-2 target nucleic acids are NOT DETECTED. The SARS-CoV-2 RNA is generally detectable in upper and lower respiratory specimens during the acute phase of infection. Negative results do not preclude SARS-CoV-2 infection, do not rule out co-infections with other pathogens, and should not be used as the sole  basis for treatment or other patient management decisions. Negative results must be combined with clinical observations, patient history, and epidemiological information. The expected result is Negative. Fact Sheet for Patients: SugarRoll.be Fact Sheet for Healthcare Providers: https://www.woods-mathews.com/ This test is not yet approved or cleared by the [place] FDA and  has been authorized for detection and/or diagnosis of SARS-CoV-2 by FDA under an Emergency Use Authorization (EUA). This EUA will remain  in effect (meaning this test can be used) for the duration of the  COVID-19 declaration under Section 56 4(b)(1) of the Act, 21 U.S.C. section 360bbb-3(b)(1), unless the authorization is terminated or revoked sooner. Performed at [facility], [place] [address], [place], [place] [address]   Surgical pcr screen     Status: None   Collection Time: 06/28/19  3:05 PM   Specimen: Nasal Mucosa; Nasal Swab  Result Value Ref Range Status   MRSA, PCR NEGATIVE NEGATIVE Final   Staphylococcus aureus NEGATIVE NEGATIVE Final    Comment: (NOTE) The Xpert SA Assay (FDA approved for NASAL specimens in patients 39 years of age and older), is one component of a comprehensive surveillance program. It is not intended to diagnose infection nor to guide or monitor treatment. Performed at [facility], [place] [address] [place], [place] [address]     Radiology Studies: No results found.   Evalyse Stroope T. [place]  If 7PM-7AM, please contact night-coverage [email or web address] Password [email or web address] 07/01/2019, 4:25 PM

## 2019-07-01 NOTE — Progress Notes (Signed)
1 Day Post-Op    CC: Abdominal pain, nausea, and vomiting, shortness of breath  Subjective: Patient doing well this a.m. feels like he has some gas he can pass.  Midline incision is okay.  He is sore and tender but pain seems to be adequately controlled.  Objective: Vital signs in last 24 hours: Temp:  [97.4 F (36.3 C)-98.4 F (36.9 C)] 98.1 F (36.7 C) (12/04 0436) Pulse Rate:  [61-68] 63 (12/04 0436) Resp:  [12-19] 17 (12/04 0436) BP: (131-151)/(60-84) 131/72 (12/04 0436) SpO2:  [98 %-100 %] 99 % (12/04 0436) Weight:  [70.1 kg] 70.1 kg (12/04 0500) Last BM Date: 06/30/19 30 p.o. 2300 IV 1250 urine No BM recorded Afebrile vital signs are stable Creatinine 1.32, glucose 161, WBC 14.0 H/H stable   Intake/Output from previous day: 12/03 0701 - 12/04 0700 In: 2323 [P.O.:30; I.V.:2293] Out: 1300 [Urine:1250; Blood:50] Intake/Output this shift: No intake/output data recorded.  General appearance: alert, cooperative and no distress Resp: clear to auscultation bilaterally and Moving about 1200 - 1300 on I-S GI: Thin abdomen bowel sounds hypoactive.  Midline incision is okay.  Normal postop discomfort.  No flatus or BM so far  Lab Results:  Recent Labs    06/30/19 1049 07/01/19 0200  WBC 15.9* 14.0*  HGB 10.6* 9.4*  HCT 33.2* 29.4*  PLT 382 343    BMET Recent Labs    06/29/19 0334 06/30/19 1049 07/01/19 0200  NA 136  --  137  K 3.8  --  5.0  CL 101  --  107  CO2 23  --  22  GLUCOSE 108*  --  161*  BUN 14  --  10  CREATININE 1.58* 1.39* 1.32*  CALCIUM 8.3*  --  8.2*   PT/INR No results for input(s): LABPROT, INR in the last 72 hours.  Recent Labs  Lab 06/26/19 0055 06/26/19 0624  AST 16 18  ALT 20 21  ALKPHOS 72 76  BILITOT 0.3 0.5  PROT 7.1 6.7  ALBUMIN 3.5 3.4*     Lipase     Component Value Date/Time   LIPASE 24 06/26/2019 0055     Medications: . amiodarone  100 mg Oral Daily  . carvedilol  3.125 mg Oral BID WC  . Chlorhexidine  Gluconate Cloth  6 each Topical Daily  . enoxaparin (LOVENOX) injection  30 mg Subcutaneous Q24H  . feeding supplement  237 mL Oral BID BM  . [START ON 07/04/2019] ferrous sulfate  325 mg Oral BID WC  . fluticasone  2 spray Each Nare Daily  . furosemide  20 mg Oral Daily  . gabapentin  300 mg Oral BID  . isosorbide-hydrALAZINE  0.5 tablet Oral TID  . loratadine  10 mg Oral Daily  . mometasone-formoterol  2 puff Inhalation BID  . montelukast  10 mg Oral QHS   . dextrose 5 % and 0.9 % NaCl with KCl 20 mEq/L 100 mL/hr at 06/30/19 2125    Assessment/Plan Weight loss AKI on CKD stage III Hx chronic systolic diastolic heart failure, EF improved 60 to 65% (ECHO 06/28/2019) - appreciate cardiology consult, "low risk for peri-op cardiac complications and can proceed with surgery tomorrow from our perspective" Anemia EtOH abuse. Asthma on inhalers Hypertension Malnutrition - prealbumin 17.2 (12/1). Appreciate nutrition consult  Adenocarcinoma of the transverse colon - Colonoscopy 06/27/2019, Dr. Lorne Skeens partially obstructing suspected mass lesion was found.  Positive for adenocarcinoma Exploratory laparotomy, transverse colectomy, splenic flexure mobilization, 06/30/2019 Dr. Ralene Ok POD #1  FEN: CLD, NPO x ice chips and sips  >> clears ID: neomycin/flagyl/cefotetan preop DVT: Lovenox Follow-up: Dr. Ralene Ok Foley: In -we will DC this AM.  Plan: Clear liquids, mobilize, DC Foley, await bowel function return.  He is very anxious about finding out the stage of his cancer and the next step.  We talked about that at some length and I told him currently he just needs either his operation.  After discharge he will follow-up with oncology, and they will determine what further treatments and testing required.       LOS: 4 days    [name] 07/01/2019 Please see Amion

## 2019-07-02 LAB — CBC
HCT: 29 % — ABNORMAL LOW (ref 39.0–52.0)
Hemoglobin: 9.2 g/dL — ABNORMAL LOW (ref 13.0–17.0)
MCH: 25 pg — ABNORMAL LOW (ref 26.0–34.0)
MCHC: 31.7 g/dL (ref 30.0–36.0)
MCV: 78.8 fL — ABNORMAL LOW (ref 80.0–100.0)
Platelets: 339 10*3/uL (ref 150–400)
RBC: 3.68 MIL/uL — ABNORMAL LOW (ref 4.22–5.81)
RDW: 16.8 % — ABNORMAL HIGH (ref 11.5–15.5)
WBC: 11.7 10*3/uL — ABNORMAL HIGH (ref 4.0–10.5)
nRBC: 0 % (ref 0.0–0.2)

## 2019-07-02 LAB — BASIC METABOLIC PANEL
Anion gap: 10 (ref 5–15)
BUN: 16 mg/dL (ref 8–23)
CO2: 24 mmol/L (ref 22–32)
Calcium: 8.3 mg/dL — ABNORMAL LOW (ref 8.9–10.3)
Chloride: 103 mmol/L (ref 98–111)
Creatinine, Ser: 1.46 mg/dL — ABNORMAL HIGH (ref 0.61–1.24)
GFR calc Af Amer: 57 mL/min — ABNORMAL LOW (ref >60)
GFR calc non Af Amer: 49 mL/min — ABNORMAL LOW (ref >60)
Glucose, Bld: 105 mg/dL — ABNORMAL HIGH (ref 70–99)
Potassium: 4.3 mmol/L (ref 3.5–5.1)
Sodium: 137 mmol/L (ref 135–145)

## 2019-07-02 LAB — MAGNESIUM: Magnesium: 2.1 mg/dL (ref 1.7–2.4)

## 2019-07-02 MED ORDER — HYDROCORTISONE (PERIANAL) 2.5 % EX CREA
1.0000 "application " | TOPICAL_CREAM | Freq: Four times a day (QID) | CUTANEOUS | Status: DC | PRN
  Filled 2019-07-02: qty 28.35

## 2019-07-02 MED ORDER — BISACODYL 10 MG RE SUPP: 10.0000 mg | Freq: Two times a day (BID) | RECTAL | Status: DC | PRN

## 2019-07-02 MED ORDER — SODIUM CHLORIDE 0.9% FLUSH
3.0000 mL | Freq: Two times a day (BID) | INTRAVENOUS | Status: DC
  Administered 2019-07-02 – 2019-07-04 (×5): 3 mL via INTRAVENOUS

## 2019-07-02 MED ORDER — SODIUM CHLORIDE 0.9 % IV SOLN: 250.0000 mL | INTRAVENOUS | Status: DC | PRN

## 2019-07-02 MED ORDER — GUAIFENESIN-DM 100-10 MG/5ML PO SYRP: 10.0000 mL | ORAL_SOLUTION | ORAL | Status: DC | PRN

## 2019-07-02 MED ORDER — SODIUM CHLORIDE 0.9% FLUSH: 3.0000 mL | INTRAVENOUS | Status: DC | PRN

## 2019-07-02 MED ORDER — PHENOL 1.4 % MT LIQD: 1.0000 | OROMUCOSAL | Status: DC | PRN

## 2019-07-02 MED ORDER — MENTHOL 3 MG MT LOZG: 1.0000 | LOZENGE | OROMUCOSAL | Status: DC | PRN

## 2019-07-02 MED ORDER — ACETAMINOPHEN 500 MG PO TABS
1000.0000 mg | ORAL_TABLET | Freq: Three times a day (TID) | ORAL | Status: DC
  Administered 2019-07-02 – 2019-07-04 (×7): 1000 mg via ORAL
  Filled 2019-07-02 (×7): qty 2

## 2019-07-02 MED ORDER — HYDROCORTISONE 1 % EX CREA
1.0000 "application " | TOPICAL_CREAM | Freq: Three times a day (TID) | CUTANEOUS | Status: DC | PRN
  Filled 2019-07-02: qty 28

## 2019-07-02 MED ORDER — LIP MEDEX EX OINT
1.0000 "application " | TOPICAL_OINTMENT | Freq: Two times a day (BID) | CUTANEOUS | Status: DC
  Administered 2019-07-02 – 2019-07-04 (×5): 1 via TOPICAL
  Filled 2019-07-02: qty 7

## 2019-07-02 MED ORDER — PSYLLIUM 95 % PO PACK
1.0000 | PACK | Freq: Every day | ORAL | Status: DC
  Administered 2019-07-02 – 2019-07-03 (×2): 1 via ORAL
  Filled 2019-07-02 (×2): qty 1

## 2019-07-02 MED ORDER — ALUM & MAG HYDROXIDE-SIMETH 200-200-20 MG/5ML PO SUSP: 30.0000 mL | Freq: Four times a day (QID) | ORAL | Status: DC | PRN

## 2019-07-02 MED ORDER — PROCHLORPERAZINE EDISYLATE 10 MG/2ML IJ SOLN: 5.0000 mg | INTRAMUSCULAR | Status: DC | PRN

## 2019-07-02 MED ORDER — LACTATED RINGERS IV BOLUS
1000.0000 mL | Freq: Three times a day (TID) | INTRAVENOUS | Status: AC | PRN
  Administered 2019-07-02: 1000 mL via INTRAVENOUS

## 2019-07-02 NOTE — Plan of Care (Signed)

## 2019-07-02 NOTE — Progress Notes (Signed)
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  In ED, HDS.  CT abdomen and pelvis revealed circumferential wall thickening within the distal transverse colon concerning for annular apple core malignancy.  GI consulted.  Had colonoscopy on 06/27/2019 concerning for malignant partially obstructing tumor in the distal transverse colon.  Biopsy obtained.  Per GI, pathology consistent with adenocarcinoma.  General surgery consulted. Plan is for exlaparotomy and colon resection 06/29/2019. Echo with EF of 60 to 65% and no other significant finding.  Cleared by advanced heart failure team for surgery.  He was taken to the OR by Dr. Rosendo Gros, surgery on 12/3.Marland Kitchen  Intraoperatively, found to have large mid transverse colon mass with bulky lymph nodes at the base of mesentery.  This was excised with 10 cm margins on either side followed by colo-colo anastomosis.   Subjective: No major events overnight of this morning.  He says he had some distention that has improved.  Small bowel movement this morning.  Denies melena or hematochezia.  Denies chest pain or dyspnea.  He is asking when he can have a solid food.  Objective: Vitals:   07/01/19 1704 07/01/19 2014 07/01/19 2146 07/02/19 0512  BP: (!) 155/88  134/68 127/67  Pulse: 84  64 66  Resp: 18  18 18   Temp: 98 F (36.7 C)  97.6 F (36.4 C) 98.6 F (37 C)  TempSrc:   Oral Oral  SpO2: 99% 98% 97% 100%  Weight:    68 kg  Height:        Intake/Output Summary (Last 24 hours) at  07/02/2019 1121 Last data filed at 07/02/2019 0500 Gross per 24 hour  Intake 800 ml  Output 350 ml  Net 450 ml   Filed Weights   06/29/19 0500 07/01/19 0500 07/02/19 0512  Weight: 64.2 kg 70.1 kg 68 kg    Examination:  GENERAL: No acute distress.  Appears well.  HEENT: MMM.  Vision and hearing grossly intact.  NECK: Supple.  No apparent JVD.  RESP:  No IWOB. Good air movement bilaterally. CVS:  RRR. Heart sounds normal.  ABD/GI/GU: BS+. Soft.  Mild tenderness.  Bloodstained with a marked area on honeycomb dressing MSK/EXT:  Moves extremities. No apparent deformity or edema.  SKIN: no apparent skin lesion or wound NEURO: Awake, alert and oriented appropriately.  No apparent focal neuro deficit. PSYCH: Calm. Normal affect.  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 -Echo basically normal now (previously 30 to 35%) -Underwent transverse colectomy with reanastomosis by Dr. Rosendo Gros on 06/30/2019. -Surgical pathology from bowel resection pending. -IV fluid discontinued. -Pain control, mobilization and ADAT  Epigastric pain/GERD: Resolved. -Continue Protonix and as needed GI cocktail  Intermittent nausea/vomiting/abdominal pain: likely due to adenocarcinoma and GERD.  Resolved. -Management as above  Chronic systolic CHF now with normal EF: Echo with EF of 60 to 65% (previously 30 to 35%).  No cardiopulmonary symptoms.  Appears euvolemic.  INR incomplete.  Renal function stable. -Losartan and Aldactone discontinued due to worsening renal function -Continue Coreg, BiDil and  low-dose Lasix -Monitor fluid status, renal function and electrolytes.  History of PVC: NSR now.  Echo as above. -Continue low-dose amiodarone  CKD-3a: Baseline Cr 1.4-1.6. Stable -Cr 1.58 (admit)> 1.26> 1.58>> 1.32> 1.46 -Holding losartan and Aldactone as above. -Continue monitoring  Iron deficiency anemia: Iron saturation 5%. -Hgb 12-13 (baseline)>  11.5 (admit)> 9.4>> 9.2 -Received IV Feraheme on 11/29. -P.o. ferrous sulfate twice daily with bowel regimen  Leukocytosis/bandemia: suspect demargination.  Resolving. -Continue trending  Essential hypertension: Normotensive. -Cardiac meds as above  Chronic asthma/COPD: Stable.  -Continue albuterol and Dulera   Nutrition Problem: Moderate Malnutrition Etiology: chronic illness(ETOH dependence, CHF, colon mass with probable colon cancer)  Signs/Symptoms: energy intake < 75% for > or equal to 1 month, mild fat depletion, moderate fat depletion, mild muscle depletion, moderate muscle depletion  Interventions: Ensure Enlive (each supplement provides 350kcal and 20 grams of protein), MVI, Magic cup   DVT prophylaxis: Subcu Lovenox Code Status: Full code Family Communication: Patient and/or RN. Available if any question. Disposition Plan: Anticipate discharge home in the next 24 hours Consultants: GI (signed off), general surgery, advanced HF  Procedures:  11/30-colonoscopy concerning for distal transverse colon cancer 12/3-Underwent transverse colon resection with colo-colo anastomosis by Dr. Rosendo Gros on 06/30/2019.  Microbiology summarized: COVID-19 screen negative.  Sch Meds:  Scheduled Meds: . acetaminophen  1,000 mg Oral TID  . amiodarone  100 mg Oral Daily  . carvedilol  3.125 mg Oral BID WC  . Chlorhexidine Gluconate Cloth  6 each Topical Daily  . enoxaparin (LOVENOX) injection  30 mg Subcutaneous Q24H  . feeding supplement  237 mL Oral BID BM  . [START ON 07/04/2019] ferrous sulfate  325 mg Oral BID WC  . fluticasone  2 spray Each Nare Daily  . furosemide  20 mg Oral Daily  . gabapentin  300 mg Oral BID  . isosorbide-hydrALAZINE  0.5 tablet Oral TID  . lip balm  1 application Topical BID  . loratadine  10 mg Oral Daily  . mometasone-formoterol  2 puff Inhalation BID  . montelukast  10 mg Oral QHS  . pantoprazole  40 mg Oral Daily  . psyllium  1 packet Oral Daily  .  sodium chloride flush  3 mL Intravenous Q12H   Continuous Infusions: . sodium chloride    . lactated ringers 1,000 mL (07/02/19 1058)   PRN Meds:.sodium chloride, albuterol, alum & mag hydroxide-simeth, bisacodyl, diphenhydrAMINE **OR** diphenhydrAMINE, guaiFENesin-dextromethorphan, hydrocortisone, hydrocortisone cream, HYDROmorphone (DILAUDID) injection, lactated ringers, menthol-cetylpyridinium, ondansetron **OR** ondansetron (ZOFRAN) IV, oxyCODONE, phenol, pneumococcal 23 valent vaccine, prochlorperazine, sodium chloride flush  Antimicrobials: Anti-infectives (From admission, onward)   Start     Dose/Rate Route Frequency Ordered Stop   06/30/19 0700  cefoTEtan (CEFOTAN) 2 g in sodium chloride 0.9 % 100 mL IVPB     2 g 200 mL/hr over 30 Minutes Intravenous On call to O.R. 06/30/19 [id number] 06/30/19 0755   06/29/19 1400  neomycin (MYCIFRADIN) tablet 1,000 mg     1,000 mg Oral 3 times per day 06/29/19 0901 06/29/19 2218   06/29/19 1400  metroNIDAZOLE (FLAGYL) tablet 1,000 mg     1,000 mg Oral 3 times per day 06/29/19 0901 06/29/19 2218   06/29/19 1000  cefoTEtan (CEFOTAN) 2 g in sodium chloride 0.9 % 100 mL IVPB     2 g 200 mL/hr over 30 Minutes Intravenous On call to O.R. 06/29/19 0901 06/30/19 0559       I have personally reviewed the following labs and images: CBC: Recent  Labs  Lab 06/27/19 0227 06/28/19 0215 06/29/19 0334 06/30/19 1049 07/01/19 0200 07/02/19 0216  WBC 9.3 11.0* 16.9* 15.9* 14.0* 11.7*  NEUTROABS 6.3 8.3* 13.1*  --   --   --   HGB 9.4* 9.4* 9.7* 10.6* 9.4* 9.2*  HCT 29.8* 29.7* 29.7* 33.2* 29.4* 29.0*  MCV 77.8* 76.7* 76.9* 77.0* 77.4* 78.8*  PLT 325 319 293 382 343 339   BMP &GFR Recent Labs  Lab 06/27/19 0227 06/28/19 0215 06/29/19 0334 06/30/19 0137 06/30/19 1049 07/01/19 0200 07/02/19 0216  NA 136 137 136  --   --  137 137  K 3.2* 3.7 3.8  --   --  5.0 4.3  CL 102 104 101  --   --  107 103  CO2 23 24 23   --   --  22 24  GLUCOSE 107* 91 108*   --   --  161* 105*  BUN 19 12 14   --   --  10 16  CREATININE 1.33* 1.26* 1.58*  --  1.39* 1.32* 1.46*  CALCIUM 8.3* 8.3* 8.3*  --   --  8.2* 8.3*  MG  --   --  2.0 2.2  --  2.1 2.1   Estimated Creatinine Clearance: 47.9 mL/min (A) (by C-G formula based on SCr of 1.46 mg/dL (H)). Liver & Pancreas: Recent Labs  Lab 06/26/19 0055 06/26/19 0624  AST 16 18  ALT 20 21  ALKPHOS 72 76  BILITOT 0.3 0.5  PROT 7.1 6.7  ALBUMIN 3.5 3.4*   Recent Labs  Lab 06/26/19 0055  LIPASE 24   No results for input(s): AMMONIA in the last 168 hours. Diabetic: No results for input(s): HGBA1C in the last 72 hours. No results for input(s): GLUCAP in the last 168 hours. Cardiac Enzymes: No results for input(s): CKTOTAL, CKMB, CKMBINDEX, TROPONINI in the last 168 hours. No results for input(s): PROBNP in the last 8760 hours. Coagulation Profile: Recent Labs  Lab 06/26/19 0152  INR 0.9   Thyroid Function Tests: No results for input(s): TSH, T4TOTAL, FREET4, T3FREE, THYROIDAB in the last 72 hours. Lipid Profile: No results for input(s): CHOL, HDL, LDLCALC, TRIG, CHOLHDL, LDLDIRECT in the last 72 hours. Anemia Panel: No results for input(s): VITAMINB12, FOLATE, FERRITIN, TIBC, IRON, RETICCTPCT in the last 72 hours. Urine analysis:    Component Value Date/Time   COLORURINE STRAW (A) 06/26/2019 0051   APPEARANCEUR CLEAR 06/26/2019 0051   LABSPEC 1.025 06/26/2019 0051   PHURINE 6.0 06/26/2019 0051   GLUCOSEU NEGATIVE 06/26/2019 0051   HGBUR NEGATIVE 06/26/2019 0051   BILIRUBINUR NEGATIVE 06/26/2019 [place] 06/26/2019 0051   PROTEINUR NEGATIVE 06/26/2019 0051   UROBILINOGEN 1.0 07/16/2013 1404   NITRITE NEGATIVE 06/26/2019 0051   LEUKOCYTESUR NEGATIVE 06/26/2019 0051   Sepsis Labs: Invalid input(s): PROCALCITONIN, [place]  Microbiology: Recent Results (from the past 240 hour(s))  SARS CORONAVIRUS 2 (TAT 6-24 HRS) Nasopharyngeal Urine, Clean Catch     Status: None    Collection Time: 06/26/19  3:22 AM   Specimen: Urine, Clean Catch; Nasopharyngeal  Result Value Ref Range Status   SARS Coronavirus 2 NEGATIVE NEGATIVE Final    Comment: (NOTE) SARS-CoV-2 target nucleic acids are NOT DETECTED. The SARS-CoV-2 RNA is generally detectable in upper and lower respiratory specimens during the acute phase of infection. Negative results do not preclude SARS-CoV-2 infection, do not rule out co-infections with other pathogens, and should not be used as the sole basis for treatment or other patient management  decisions. Negative results must be combined with clinical observations, patient history, and epidemiological information. The expected result is Negative. Fact Sheet for Patients: SugarRoll.be Fact Sheet for Healthcare Providers: https://www.woods-mathews.com/ This test is not yet approved or cleared by the [place] FDA and  has been authorized for detection and/or diagnosis of SARS-CoV-2 by FDA under an Emergency Use Authorization (EUA). This EUA will remain  in effect (meaning this test can be used) for the duration of the COVID-19 declaration under Section 56 4(b)(1) of the Act, 21 U.S.C. section 360bbb-3(b)(1), unless the authorization is terminated or revoked sooner. Performed at [facility], [place] [address], [place], [place] [address]   Surgical pcr screen     Status: None   Collection Time: 06/28/19  3:05 PM   Specimen: Nasal Mucosa; Nasal Swab  Result Value Ref Range Status   MRSA, PCR NEGATIVE NEGATIVE Final   Staphylococcus aureus NEGATIVE NEGATIVE Final    Comment: (NOTE) The Xpert SA Assay (FDA approved for NASAL specimens in patients 52 years of age and older), is one component of a comprehensive surveillance program. It is not intended to diagnose infection nor to guide or monitor treatment. Performed at [facility], [place] [address], [place], [place] [address]     Radiology  Studies: No results found.   Alisson Rozell T. Hart  If 7PM-7AM, please contact night-coverage [email or web address] Password [email or web address] 07/02/2019, 11:21 AM

## 2019-07-02 NOTE — Progress Notes (Signed)
Joe Garcia [id number] 29-Jan-1951  CARE TEAM:  PCP: Medicine, [facility]  Outpatient Care Team: Patient Care Team: Medicine, [facility] as PCP - General [name], LCSW as Social Worker (Licensed Holiday representative)  Inpatient Treatment Team: Treatment Team: Attending Provider: Mercy Riding, MD; Registered Nurse: Lucia Bitter, RN; Consulting Physician: Edison Pace Md, MD; Technician: Gasper Sells, NT; Consulting Physician: Lbcardiology, Michae Kava, MD; Rounding Team: Ivonne Andrew, MD; Registered Nurse: Westly Pam, RN; Registered Nurse: Kennith Center, RN; Technician: Marletta Lor, NT   Problem List:   Principal Problem:   Nausea and vomiting Active Problems:   Hypokalemia   ARF (acute renal failure) (HCC)   Nausea & vomiting   Colonic mass   Colonic obstruction (HCC)   Malignant neoplasm of transverse colon ([place])   Chronic systolic heart failure ([place])   2 Days Post-Op  06/30/2019  PRE-OPERATIVE DIAGNOSIS:  ADENOCARCINOMA TRANSVERSE COLON  POST-OPERATIVE DIAGNOSIS:  ADENOCARCINOMA TRANSVERSE COLON  PROCEDURE:   EXPLORATORY LAPAROTOMY (N/A) TRANSVERSE COLECTOMY SPLENIC FLEXURE MOBILIZATION  SURGEON: Ralene Ok, MD - Primary  Assessment Weight loss AKI on CKD stage III Hx chronic systolic diastolic heart failure, 0000000 to 65%(ECHO12/07/2018) - appreciate cardiology consult, "low risk for peri-op cardiac complications and can proceed with surgery tomorrow from our perspective" Anemia EtOH abuse. Asthma on inhalers Hypertension Malnutrition - prealbumin17.2 (12/1). Appreciatenutrition consult  Adenocarcinoma of the transverse colon -Colonoscopy 06/27/2019, Dr. Lorne Skeens partially obstructing suspected mass lesion was found.  Positive for adenocarcinoma Exploratory laparotomy, transverse colectomy, splenic flexure mobilization, 06/30/2019 Dr. Ralene Ok    Plan   [name] ileus resolving.  Tolerating clears.  Dysphagia 1/pured diet.  Advance to soft as tolerated.  No IV fluids  Hypertension control ID:No more antibiotics DVT: Lovenox Follow-up:  Pathology on colon resection pending outpatient follow-up with Dr. Ralene Ok   Plan: 35 minutes spent in review, evaluation, examination, counseling, and coordination of care.  More than 50% of that time was spent in counseling.  07/02/2019    Subjective: (Chief complaint)  Feeling better overall.  Tolerating liquids.  Wants eggs.  Pain controlled.  Objective:  Vital signs:  Vitals:   07/01/19 1704 07/01/19 2014 07/01/19 2146 07/02/19 0512  BP: (!) 155/88  134/68 127/67  Pulse: 84  64 66  Resp: 18  18 18   Temp: 98 F (36.7 C)  97.6 F (36.4 C) 98.6 F (37 C)  TempSrc:   Oral Oral  SpO2: 99% 98% 97% 100%  Weight:    68 kg  Height:        Last BM Date: 07/01/19  Intake/Output   Yesterday:  12/04 0701 - 12/05 0700 In: 800 [P.O.:800] Out: 350 [Urine:350] This shift:  No intake/output data recorded.  Bowel function:  Flatus: YES  BM:  YES  Drain: (No drain)   Physical Exam:  General: Pt awake/alert/oriented x4 in no acute distress Eyes: PERRL, normal EOM.  Sclera clear.  No icterus Neuro: CN II-XII intact w/o focal sensory/motor deficits. Lymph: No head/neck/groin lymphadenopathy Psych:  No delerium/psychosis/paranoia HENT: Normocephalic, Mucus membranes moist.  No thrush Neck: Supple, No tracheal deviation Chest: No chest wall pain w good excursion CV:  Pulses intact.  Regular rhythm MS: Normal AROM mjr joints.  No obvious deformity  Abdomen: Soft.  Moderately distended.  Nontender.  No evidence of peritonitis.  No incarcerated hernias.  Ext:   No deformity.  No mjr edema.  No cyanosis Skin: No petechiae / purpura  Results:   Cultures: Recent Results (from the past 720 hour(s))  SARS CORONAVIRUS 2 (TAT 6-24 HRS) Nasopharyngeal Urine, Clean  Catch     Status: None   Collection Time: 06/26/19  3:22 AM   Specimen: Urine, Clean Catch; Nasopharyngeal  Result Value Ref Range Status   SARS Coronavirus 2 NEGATIVE NEGATIVE Final    Comment: (NOTE) SARS-CoV-2 target nucleic acids are NOT DETECTED. The SARS-CoV-2 RNA is generally detectable in upper and lower respiratory specimens during the acute phase of infection. Negative results do not preclude SARS-CoV-2 infection, do not rule out co-infections with other pathogens, and should not be used as the sole basis for treatment or other patient management decisions. Negative results must be combined with clinical observations, patient history, and epidemiological information. The expected result is Negative. Fact Sheet for Patients: SugarRoll.be Fact Sheet for Healthcare Providers: https://www.woods-mathews.com/ This test is not yet approved or cleared by the [place] FDA and  has been authorized for detection and/or diagnosis of SARS-CoV-2 by FDA under an Emergency Use Authorization (EUA). This EUA will remain  in effect (meaning this test can be used) for the duration of the COVID-19 declaration under Section 56 4(b)(1) of the Act, 21 U.S.C. section 360bbb-3(b)(1), unless the authorization is terminated or revoked sooner. Performed at [facility], [place] [address] [place], [place] [address]   Surgical pcr screen     Status: None   Collection Time: 06/28/19  3:05 PM   Specimen: Nasal Mucosa; Nasal Swab  Result Value Ref Range Status   MRSA, PCR NEGATIVE NEGATIVE Final   Staphylococcus aureus NEGATIVE NEGATIVE Final    Comment: (NOTE) The Xpert SA Assay (FDA approved for NASAL specimens in patients 47 years of age and older), is one component of a comprehensive surveillance program. It is not intended to diagnose infection nor to guide or monitor treatment. Performed at [facility], [place] [address], [place],  [place] [address]     Labs: Results for orders placed or performed during the hospital encounter of 06/26/19 (from the past 48 hour(s))  CBC     Status: Abnormal   Collection Time: 06/30/19 10:49 AM  Result Value Ref Range   WBC 15.9 (H) 4.0 - 10.5 K/uL   RBC 4.31 4.22 - 5.81 MIL/uL   Hemoglobin 10.6 (L) 13.0 - 17.0 g/dL   HCT 33.2 (L) 39.0 - 52.0 %   MCV 77.0 (L) 80.0 - 100.0 fL   MCH 24.6 (L) 26.0 - 34.0 pg   MCHC 31.9 30.0 - 36.0 g/dL   RDW 16.1 (H) 11.5 - 15.5 %   Platelets 382 150 - 400 K/uL   nRBC 0.0 0.0 - 0.2 %    Comment: Performed at [facility], [address] [place], [place] [address]  Creatinine, serum     Status: Abnormal   Collection Time: 06/30/19 10:49 AM  Result Value Ref Range   Creatinine, Ser 1.39 (H) 0.61 - 1.24 mg/dL   GFR calc non Af Amer 52 (L) >60 mL/min   GFR calc Af Amer >60 >60 mL/min    Comment: Performed at [facility] [address], [place], [place] [address]  Magnesium-daily     Status: None   Collection Time: 07/01/19  2:00 AM  Result Value Ref Range   Magnesium 2.1 1.7 - 2.4 mg/dL    Comment: Performed at [facility], [place] [address] [place], [place] [id number]  Basic metabolic panel  Status: Abnormal   Collection Time: 07/01/19  2:00 AM  Result Value Ref Range   Sodium 137 135 - 145 mmol/L   Potassium 5.0 3.5 - 5.1 mmol/L   Chloride 107 98 - 111 mmol/L   CO2 22 22 - 32 mmol/L   Glucose, Bld 161 (H) 70 - 99 mg/dL   BUN 10 8 - 23 mg/dL   Creatinine, Ser 1.32 (H) 0.61 - 1.24 mg/dL   Calcium 8.2 (L) 8.9 - 10.3 mg/dL   GFR calc non Af Amer 56 (L) >60 mL/min   GFR calc Af Amer >60 >60 mL/min   Anion gap 8 5 - 15    Comment: Performed at [facility] [address] [place], [place] [address]  CBC     Status: Abnormal   Collection Time: 07/01/19  2:00 AM  Result Value Ref Range   WBC 14.0 (H) 4.0 - 10.5 K/uL   RBC 3.80 (L) 4.22 - 5.81 MIL/uL   Hemoglobin 9.4 (L) 13.0 - 17.0 g/dL   HCT 29.4 (L) 39.0 - 52.0 %    MCV 77.4 (L) 80.0 - 100.0 fL   MCH 24.7 (L) 26.0 - 34.0 pg   MCHC 32.0 30.0 - 36.0 g/dL   RDW 16.3 (H) 11.5 - 15.5 %   Platelets 343 150 - 400 K/uL   nRBC 0.0 0.0 - 0.2 %    Comment: Performed at [facility], [place] [address] [place], [place] [address]  Magnesium-daily     Status: None   Collection Time: 07/02/19  2:16 AM  Result Value Ref Range   Magnesium 2.1 1.7 - 2.4 mg/dL    Comment: Performed at [facility] [address] [place], [place] [address]  BMP-5am     Status: Abnormal   Collection Time: 07/02/19  2:16 AM  Result Value Ref Range   Sodium 137 135 - 145 mmol/L   Potassium 4.3 3.5 - 5.1 mmol/L   Chloride 103 98 - 111 mmol/L   CO2 24 22 - 32 mmol/L   Glucose, Bld 105 (H) 70 - 99 mg/dL   BUN 16 8 - 23 mg/dL   Creatinine, Ser 1.46 (H) 0.61 - 1.24 mg/dL   Calcium 8.3 (L) 8.9 - 10.3 mg/dL   GFR calc non Af Amer 49 (L) >60 mL/min   GFR calc Af Amer 57 (L) >60 mL/min   Anion gap 10 5 - 15    Comment: Performed at [facility] [address] [place], [place] [address]  CBC-5am     Status: Abnormal   Collection Time: 07/02/19  2:16 AM  Result Value Ref Range   WBC 11.7 (H) 4.0 - 10.5 K/uL   RBC 3.68 (L) 4.22 - 5.81 MIL/uL   Hemoglobin 9.2 (L) 13.0 - 17.0 g/dL   HCT 29.0 (L) 39.0 - 52.0 %   MCV 78.8 (L) 80.0 - 100.0 fL   MCH 25.0 (L) 26.0 - 34.0 pg   MCHC 31.7 30.0 - 36.0 g/dL   RDW 16.8 (H) 11.5 - 15.5 %   Platelets 339 150 - 400 K/uL   nRBC 0.0 0.0 - 0.2 %    Comment: Performed at [facility] [address] [place], [place] [address]    Imaging / Studies: No results found.  Medications / Allergies: per chart  Antibiotics: Anti-infectives (From admission, onward)   Start     Dose/Rate Route Frequency Ordered Stop   06/30/19 0700  cefoTEtan (CEFOTAN) 2 g in sodium chloride 0.9 % 100 mL IVPB     2 g 200 mL/hr over 30 Minutes Intravenous On call to O.R. 06/30/19 [id number] 06/30/19 0755   06/29/19 1400  neomycin (MYCIFRADIN) tablet 1,000  mg     1,000 mg Oral 3 times per day 06/29/19 0901 06/29/19 2218   06/29/19 1400  metroNIDAZOLE (FLAGYL) tablet 1,000 mg     1,000 mg Oral 3 times per day 06/29/19 0901 06/29/19 2218   06/29/19 1000  cefoTEtan (CEFOTAN) 2 g in sodium chloride 0.9 % 100 mL IVPB     2 g 200 mL/hr over 30 Minutes Intravenous On call to O.R. 06/29/19 0901 06/30/19 0559        Note: Portions of this report may have been transcribed using voice recognition software. Every effort was made to ensure accuracy; however, inadvertent computerized transcription errors may be present.   Any transcriptional errors that result from this process are unintentional.     Adin Hector, MD, FACS, MASCRS Gastrointestinal and Minimally Invasive Surgery    [address] [place], [place] [address] [phone] Main / Paging [phone] Fax

## 2019-07-03 LAB — CBC
HCT: 27.3 % — ABNORMAL LOW (ref 39.0–52.0)
Hemoglobin: 8.7 g/dL — ABNORMAL LOW (ref 13.0–17.0)
MCH: 25.3 pg — ABNORMAL LOW (ref 26.0–34.0)
MCHC: 31.9 g/dL (ref 30.0–36.0)
MCV: 79.4 fL — ABNORMAL LOW (ref 80.0–100.0)
Platelets: 313 10*3/uL (ref 150–400)
RBC: 3.44 MIL/uL — ABNORMAL LOW (ref 4.22–5.81)
RDW: 17.1 % — ABNORMAL HIGH (ref 11.5–15.5)
WBC: 9.6 10*3/uL (ref 4.0–10.5)
nRBC: 0 % (ref 0.0–0.2)

## 2019-07-03 LAB — BASIC METABOLIC PANEL
Anion gap: 10 (ref 5–15)
BUN: 14 mg/dL (ref 8–23)
CO2: 24 mmol/L (ref 22–32)
Calcium: 8.4 mg/dL — ABNORMAL LOW (ref 8.9–10.3)
Chloride: 104 mmol/L (ref 98–111)
Creatinine, Ser: 1.33 mg/dL — ABNORMAL HIGH (ref 0.61–1.24)
GFR calc Af Amer: >60 mL/min (ref >60)
GFR calc non Af Amer: 55 mL/min — ABNORMAL LOW (ref >60)
Glucose, Bld: 99 mg/dL (ref 70–99)
Potassium: 4 mmol/L (ref 3.5–5.1)
Sodium: 138 mmol/L (ref 135–145)

## 2019-07-03 LAB — MAGNESIUM: Magnesium: 2 mg/dL (ref 1.7–2.4)

## 2019-07-03 MED ORDER — PSYLLIUM 95 % PO PACK
1.0000 | PACK | Freq: Two times a day (BID) | ORAL | Status: DC
  Administered 2019-07-03 – 2019-07-04 (×2): 1 via ORAL
  Filled 2019-07-03 (×4): qty 1

## 2019-07-03 NOTE — Progress Notes (Signed)
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  In ED, HDS.  CT abdomen and pelvis revealed circumferential wall thickening within the distal transverse colon concerning for annular apple core malignancy.  GI consulted.  Had colonoscopy on 06/27/2019 concerning for malignant partially obstructing tumor in the distal transverse colon.  Biopsy obtained.  Per GI, pathology consistent with adenocarcinoma.  General surgery consulted. Plan is for exlaparotomy and colon resection 06/29/2019. Echo with EF of 60 to 65% and no other significant finding.  Cleared by advanced heart failure team for surgery.  He was taken to the OR by Dr. Rosendo Gros, surgery on 12/3.Marland Kitchen  Intraoperatively, found to have large mid transverse colon mass with bulky lymph nodes at the base of mesentery.  This was excised with 10 cm margins on either side followed by colo-colo anastomosis.   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 -Echo basically normal now (previously 30 to 35%) -Underwent transverse colectomy with reanastomosis by Dr. Rosendo Gros on 06/30/2019. -Surgical pathology from bowel resection pending. -IV fluid discontinued. -Pain control, mobilization and ADAT  Epigastric pain/GERD: Resolved. -Continue Protonix and as needed GI cocktail  Intermittent nausea/vomiting/abdominal pain/postoperative ileus: likely due to adenocarcinoma and GERD.  Resolved.  Tolerating soft diet.  General surgery saw him.   Advancing to regular diet.  Chronic systolic CHF now with normal EF: Echo with EF of 60 to 65% (previously 30 to 35%).  No cardiopulmonary symptoms.  Appears euvolemic.  INR incomplete.  Renal function stable. -Losartan and Aldactone discontinued due to worsening renal function -Continue Coreg, BiDil and low-dose Lasix -Monitor fluid status, renal function and electrolytes.  History of PVC: NSR now.  Echo as above. -Continue low-dose amiodarone  CKD-3a: Baseline Cr 1.4-1.6. Stable -Cr 1.58 (admit)> 1.26> 1.58>> 1.32> 1.46> 1.33 -Holding losartan and Aldactone as above. -Continue monitoring  Iron deficiency anemia: Iron saturation 5%. -Hgb 12-13 (baseline)> 11.5 (admit)> 9.4>> 9.2> 8.7 -Received IV Feraheme on 11/29. -P.o. ferrous sulfate twice daily with bowel regimen  Leukocytosis/bandemia: suspect demargination.  Resolved. -Continue trending  Essential hypertension: Normotensive. -Cardiac meds as above  Chronic asthma/COPD: Stable.  -Continue albuterol and Dulera.  Nutrition Problem: Moderate Malnutrition Etiology: chronic illness(ETOH dependence, CHF, colon mass with probable colon cancer)  Signs/Symptoms: energy intake < 75% for > or equal to 1 month, mild fat depletion, moderate fat depletion, mild muscle depletion, moderate muscle depletion  Interventions: Ensure Enlive (each supplement provides 350kcal and 20 grams of protein), MVI, Magic cup   DVT prophylaxis: Subcu Lovenox Code Status: Full code Family Communication: No family present.  Plan of care discussed with patient.  He is alert and oriented and verbalized understanding. Disposition Plan: Anticipate discharge home in the next 24 hours if cleared by general surgery. Consultants: GI (signed off), general surgery, advanced HF   Subjective: Seen and examined.  Feels much better.  No complaints.  Tolerating soft diet.  Objective: Vitals:   07/02/19 2033 07/02/19 2130 07/03/19 0500 07/03/19 0519  BP:  129/70   124/71  Pulse:  62  (!) 56  Resp:  20  17  Temp:  98.4 F (36.9 C)  97.8 F (36.6 C)  TempSrc:  Oral  Oral  SpO2: 98% 100%  100%  Weight:   71.5 kg   Height:        Intake/Output Summary (Last 24 hours) at 07/03/2019 0948 Last data filed at 07/03/2019 0522 Gross per 24 hour  Intake 600 ml  Output 975 ml  Net -375 ml   Filed Weights   07/01/19 0500 07/02/19 0512 07/03/19 0500  Weight: 70.1 kg 68 kg 71.5 kg    Examination:  General exam: Appears calm and comfortable  Respiratory system: Clear to auscultation. Respiratory effort normal. Cardiovascular system: S1 & S2 heard, RRR. No JVD, murmurs, rubs, gallops or clicks. No pedal edema. Gastrointestinal system: Abdomen is nondistended, soft, has vertical staples.  Mild generalized tenderness.. No organomegaly or masses felt. Normal bowel sounds heard. Central nervous system: Alert and oriented. No focal neurological deficits. Extremities: Symmetric 5 x 5 power. Skin: No rashes, lesions or ulcers.  Psychiatry: Judgement and insight appear normal. Mood & affect appropriate.    Procedures:  11/30-colonoscopy concerning for distal transverse colon cancer 12/3-Underwent transverse colon resection with colo-colo anastomosis by Dr. Rosendo Gros on 06/30/2019.  Microbiology summarized: COVID-19 screen negative.  Sch Meds:  Scheduled Meds: . acetaminophen  1,000 mg Oral TID  . amiodarone  100 mg Oral Daily  . carvedilol  3.125 mg Oral BID WC  . Chlorhexidine Gluconate Cloth  6 each Topical Daily  . enoxaparin (LOVENOX) injection  30 mg Subcutaneous Q24H  . feeding supplement  237 mL Oral BID BM  . [START ON 07/04/2019] ferrous sulfate  325 mg Oral BID WC  . fluticasone  2 spray Each Nare Daily  . furosemide  20 mg Oral Daily  . gabapentin  300 mg Oral BID  . isosorbide-hydrALAZINE  0.5 tablet Oral TID  . lip balm  1 application Topical BID  . loratadine  10 mg Oral Daily  . mometasone-formoterol  2 puff Inhalation BID  .  montelukast  10 mg Oral QHS  . pantoprazole  40 mg Oral Daily  . psyllium  1 packet Oral Daily  . sodium chloride flush  3 mL Intravenous Q12H   Continuous Infusions: . sodium chloride    . lactated ringers 1,000 mL (07/02/19 1058)   PRN Meds:.sodium chloride, albuterol, alum & mag hydroxide-simeth, bisacodyl, diphenhydrAMINE **OR** diphenhydrAMINE, guaiFENesin-dextromethorphan, hydrocortisone, hydrocortisone cream, HYDROmorphone (DILAUDID) injection, lactated ringers, menthol-cetylpyridinium, ondansetron **OR** ondansetron (ZOFRAN) IV, oxyCODONE, phenol, pneumococcal 23 valent vaccine, prochlorperazine, sodium chloride flush  Antimicrobials: Anti-infectives (From admission, onward)   Start     Dose/Rate Route Frequency Ordered Stop   06/30/19 0700  cefoTEtan (CEFOTAN) 2 g in sodium chloride 0.9 % 100 mL IVPB     2 g 200 mL/hr over 30 Minutes Intravenous On call to O.R. 06/30/19 [id number] 06/30/19 0755   06/29/19 1400  neomycin (MYCIFRADIN) tablet 1,000 mg     1,000 mg Oral 3 times per day 06/29/19 0901 06/29/19 2218   06/29/19 1400  metroNIDAZOLE (FLAGYL) tablet 1,000 mg     1,000 mg Oral 3 times per day 06/29/19 0901 06/29/19 2218   06/29/19 1000  cefoTEtan (CEFOTAN) 2 g in sodium chloride 0.9 % 100 mL IVPB     2 g 200 mL/hr over 30 Minutes Intravenous On call to O.R. 06/29/19 0901 06/30/19 0559       I have personally reviewed the following labs and images: CBC: Recent Labs  Lab 06/27/19 0227 06/28/19 0215 06/29/19 0334 06/30/19 1049 07/01/19 0200 07/02/19 0216 07/03/19 0250  WBC 9.3 11.0* 16.9* 15.9* 14.0* 11.7* 9.6  NEUTROABS 6.3 8.3* 13.1*  --   --   --   --   HGB 9.4* 9.4* 9.7* 10.6* 9.4* 9.2* 8.7*  HCT 29.8* 29.7* 29.7* 33.2* 29.4* 29.0* 27.3*  MCV 77.8* 76.7* 76.9* 77.0* 77.4* 78.8* 79.4*  PLT 325 319 293 382 343 339 313   BMP &GFR Recent Labs  Lab 06/28/19 0215 06/29/19 0334 06/30/19 0137 06/30/19 1049 07/01/19 0200 07/02/19 0216 07/03/19 0250  NA 137 136   --   --  137 137 138  K 3.7 3.8  --   --  5.0 4.3 4.0  CL 104 101  --   --  107 103 104  CO2 24 23  --   --  22 24 24   GLUCOSE 91 108*  --   --  161* 105* 99  BUN 12 14  --   --  10 16 14   CREATININE 1.26* 1.58*  --  1.39* 1.32* 1.46* 1.33*  CALCIUM 8.3* 8.3*  --   --  8.2* 8.3* 8.4*  MG  --  2.0 2.2  --  2.1 2.1 2.0   Estimated Creatinine Clearance: 55.3 mL/min (A) (by C-G formula based on SCr of 1.33 mg/dL (H)). Liver & Pancreas: No results for input(s): AST, ALT, ALKPHOS, BILITOT, PROT, ALBUMIN in the last 168 hours. No results for input(s): LIPASE, AMYLASE in the last 168 hours. No results for input(s): AMMONIA in the last 168 hours. Diabetic: No results for input(s): HGBA1C in the last 72 hours. No results for input(s): GLUCAP in the last 168 hours. Cardiac Enzymes: No results for input(s): CKTOTAL, CKMB, CKMBINDEX, TROPONINI in the last 168 hours. No results for input(s): PROBNP in the last 8760 hours. Coagulation Profile: No results for input(s): INR, PROTIME in the last 168 hours. Thyroid Function Tests: No results for input(s): TSH, T4TOTAL, FREET4, T3FREE, THYROIDAB in the last 72 hours. Lipid Profile: No results for input(s): CHOL, HDL, LDLCALC, TRIG, CHOLHDL, LDLDIRECT in the last 72 hours. Anemia Panel: No results for input(s): VITAMINB12, FOLATE, FERRITIN, TIBC, IRON, RETICCTPCT in the last 72 hours. Urine analysis:    Component Value Date/Time   COLORURINE STRAW (A) 06/26/2019 0051   APPEARANCEUR CLEAR 06/26/2019 0051   LABSPEC 1.025 06/26/2019 0051   PHURINE 6.0 06/26/2019 0051   GLUCOSEU NEGATIVE 06/26/2019 0051   HGBUR NEGATIVE 06/26/2019 0051   BILIRUBINUR NEGATIVE 06/26/2019 [place] 06/26/2019 0051   PROTEINUR NEGATIVE 06/26/2019 0051   UROBILINOGEN 1.0 07/16/2013 1404   NITRITE NEGATIVE 06/26/2019 0051   LEUKOCYTESUR NEGATIVE 06/26/2019 0051   Sepsis Labs: Invalid input(s): PROCALCITONIN, [place]  Microbiology: Recent Results  (from the past 240 hour(s))  SARS CORONAVIRUS 2 (TAT 6-24 HRS) Nasopharyngeal Urine, Clean Catch     Status: None   Collection Time: 06/26/19  3:22 AM   Specimen: Urine, Clean Catch; Nasopharyngeal  Result Value Ref Range Status   SARS Coronavirus 2 NEGATIVE NEGATIVE Final    Comment: (NOTE) SARS-CoV-2 target nucleic acids are NOT DETECTED. The SARS-CoV-2 RNA is generally detectable in upper and lower respiratory specimens during the acute phase of infection. Negative results do not preclude SARS-CoV-2 infection, do not rule out co-infections with other pathogens, and should not be used as the sole basis for treatment or other patient management decisions. Negative results must be combined with clinical observations, patient history, and epidemiological  information. The expected result is Negative. Fact Sheet for Patients: SugarRoll.be Fact Sheet for Healthcare Providers: https://www.woods-mathews.com/ This test is not yet approved or cleared by the [place] FDA and  has been authorized for detection and/or diagnosis of SARS-CoV-2 by FDA under an Emergency Use Authorization (EUA). This EUA will remain  in effect (meaning this test can be used) for the duration of the COVID-19 declaration under Section 56 4(b)(1) of the Act, 21 U.S.C. section 360bbb-3(b)(1), unless the authorization is terminated or revoked sooner. Performed at [facility], [place] [address] [place], [place] [address]   Surgical pcr screen     Status: None   Collection Time: 06/28/19  3:05 PM   Specimen: Nasal Mucosa; Nasal Swab  Result Value Ref Range Status   MRSA, PCR NEGATIVE NEGATIVE Final   Staphylococcus aureus NEGATIVE NEGATIVE Final    Comment: (NOTE) The Xpert SA Assay (FDA approved for NASAL specimens in patients 14 years of age and older), is one component of a comprehensive surveillance program. It is not intended to diagnose infection nor to guide  or monitor treatment. Performed at [facility], [place] [address] [place], [place] [address]     Radiology Studies: No results found.  Total time spent 30 minutes Darliss Cheney, MD [facility]  If 7PM-7AM, please contact night-coverage [email or web address] Password [facility] 07/03/2019, 9:48 AM

## 2019-07-03 NOTE — Progress Notes (Addendum)
Joe Garcia [id number] 1951-07-10  CARE TEAM:  PCP: Medicine, [facility]  Outpatient Care Team: Patient Care Team: Medicine, [facility] as PCP - General Bebe Liter, Connye Burkitt, LCSW as Social Worker (Licensed Holiday representative)  Inpatient Treatment Team: Treatment Team: Attending Provider: Darliss Cheney, MD; Registered Nurse: Lucia Bitter, RN; Consulting Physician: Edison Pace Md, MD; Technician: Gasper Sells, [place]; Rounding Team: Ivonne Andrew, MD; Registered Nurse: Kennith Center, RN; Technician: Marletta Lor, NT; Registered Nurse: Roselind Rily, RN; Respiratory Therapist: Judith Part, RRT; Case Manager: Claudie Leach, RN   Problem List:   Principal Problem:   Nausea and vomiting Active Problems:   Malignant neoplasm of transverse colon [facility])   Essential hypertension   Alcohol abuse   Hypokalemia   Chronic combined systolic (congestive) and diastolic (congestive) heart failure (HCC)   ARF (acute renal failure) (HCC)   Nausea & vomiting   Colonic mass   Colonic obstruction (HCC)   Chronic systolic heart failure ([place])   3 Days Post-Op  06/30/2019  PRE-OPERATIVE DIAGNOSIS:  ADENOCARCINOMA TRANSVERSE COLON  POST-OPERATIVE DIAGNOSIS:  ADENOCARCINOMA TRANSVERSE COLON  PROCEDURE:   EXPLORATORY LAPAROTOMY (N/A) TRANSVERSE COLECTOMY SPLENIC FLEXURE MOBILIZATION  SURGEON: Ralene Ok, MD - Primary  Assessment Weight loss AKI on CKD stage III Hx chronic systolic diastolic heart failure, 0000000 to 65%(ECHO12/07/2018) - appreciate cardiology consult, "low risk for peri-op cardiac complications and can proceed with surgery tomorrow from our perspective" Anemia EtOH abuse. Asthma on inhalers Hypertension Malnutrition - prealbumin17.2 (12/1). Appreciatenutrition consult  Adenocarcinoma of the transverse colon -Colonoscopy 06/27/2019, Dr. Lorne Skeens partially obstructing suspected mass  lesion was found.  Positive for adenocarcinoma Exploratory laparotomy, transverse colectomy, splenic flexure mobilization, 06/30/2019 Dr. Ralene Ok    Plan  [id number] ileus resolving.  Advance to solid diet as tolerated.   No IV fluids  Hypertension control ID:No more antibiotics DVT: Lovenox Dispo: Most likely can discharge tomorrow if continues to improve from surgery standpoint Follow-up:  Pathology on colon resection pending.  Outpatient follow-up with Dr. Ralene Ok    35 minutes spent in review, evaluation, examination, counseling, and coordination of care.  More than 50% of that time was spent in counseling.  07/03/2019    Subjective: (Chief complaint)  Pain less.  Walking a lot more.  Having much more flatus.  Tolerated soft breakfast  Objective:  Vital signs:  Vitals:   07/02/19 2033 07/02/19 2130 07/03/19 0500 07/03/19 0519  BP:  129/70  124/71  Pulse:  62  (!) 56  Resp:  20  17  Temp:  98.4 F (36.9 C)  97.8 F (36.6 C)  TempSrc:  Oral  Oral  SpO2: 98% 100%  100%  Weight:   71.5 kg   Height:        Last BM Date: 07/01/19  Intake/Output   Yesterday:  12/05 0701 - 12/06 0700 In: 600 [P.O.:600] Out: 975 [Urine:975] This shift:  No intake/output data recorded.  Bowel function:  Flatus: YES  BM:  No  Drain: (No drain)   Physical Exam:  General: Pt awake/alert/oriented x4 in no acute distress Eyes: PERRL, normal EOM.  Sclera clear.  No icterus Neuro: CN II-XII intact w/o focal sensory/motor deficits. Lymph: No head/neck/groin lymphadenopathy Psych:  No delerium/psychosis/paranoia HENT: Normocephalic, Mucus membranes moist.  No thrush Neck: Supple, No tracheal deviation Chest: No chest wall pain w good excursion CV:  Pulses intact.  Regular rhythm MS: Normal AROM mjr joints.  No obvious  deformity  Abdomen: Soft.  Mildy distended.  Nontender.  No evidence of peritonitis.  No incarcerated hernias.  Ext:   No  deformity.  No mjr edema.  No cyanosis Skin: No petechiae / purpura  Results:   Cultures: Recent Results (from the past 720 hour(s))  SARS CORONAVIRUS 2 (TAT 6-24 HRS) Nasopharyngeal Urine, Clean Catch     Status: None   Collection Time: 06/26/19  3:22 AM   Specimen: Urine, Clean Catch; Nasopharyngeal  Result Value Ref Range Status   SARS Coronavirus 2 NEGATIVE NEGATIVE Final    Comment: (NOTE) SARS-CoV-2 target nucleic acids are NOT DETECTED. The SARS-CoV-2 RNA is generally detectable in upper and lower respiratory specimens during the acute phase of infection. Negative results do not preclude SARS-CoV-2 infection, do not rule out co-infections with other pathogens, and should not be used as the sole basis for treatment or other patient management decisions. Negative results must be combined with clinical observations, patient history, and epidemiological information. The expected result is Negative. Fact Sheet for Patients: SugarRoll.be Fact Sheet for Healthcare Providers: https://www.woods-mathews.com/ This test is not yet approved or cleared by the [place] FDA and  has been authorized for detection and/or diagnosis of SARS-CoV-2 by FDA under an Emergency Use Authorization (EUA). This EUA will remain  in effect (meaning this test can be used) for the duration of the COVID-19 declaration under Section 56 4(b)(1) of the Act, 21 U.S.C. section 360bbb-3(b)(1), unless the authorization is terminated or revoked sooner. Performed at [facility], [place] [address], [place], [place] [address]   Surgical pcr screen     Status: None   Collection Time: 06/28/19  3:05 PM   Specimen: Nasal Mucosa; Nasal Swab  Result Value Ref Range Status   MRSA, PCR NEGATIVE NEGATIVE Final   Staphylococcus aureus NEGATIVE NEGATIVE Final    Comment: (NOTE) The Xpert SA Assay (FDA approved for NASAL specimens in patients 6 years of age and older), is  one component of a comprehensive surveillance program. It is not intended to diagnose infection nor to guide or monitor treatment. Performed at [facility], [place] [address] [place], [place] [address]     Labs: Results for orders placed or performed during the hospital encounter of 06/26/19 (from the past 48 hour(s))  Magnesium-daily     Status: None   Collection Time: 07/02/19  2:16 AM  Result Value Ref Range   Magnesium 2.1 1.7 - 2.4 mg/dL    Comment: Performed at [facility], [place] [address], [place], [place] [address]  BMP-5am     Status: Abnormal   Collection Time: 07/02/19  2:16 AM  Result Value Ref Range   Sodium 137 135 - 145 mmol/L   Potassium 4.3 3.5 - 5.1 mmol/L   Chloride 103 98 - 111 mmol/L   CO2 24 22 - 32 mmol/L   Glucose, Bld 105 (H) 70 - 99 mg/dL   BUN 16 8 - 23 mg/dL   Creatinine, Ser 1.46 (H) 0.61 - 1.24 mg/dL   Calcium 8.3 (L) 8.9 - 10.3 mg/dL   GFR calc non Af Amer 49 (L) >60 mL/min   GFR calc Af Amer 57 (L) >60 mL/min   Anion gap 10 5 - 15    Comment: Performed at [facility] [address] [place], [place] [address]  CBC-5am     Status: Abnormal   Collection Time: 07/02/19  2:16 AM  Result Value Ref Range   WBC  11.7 (H) 4.0 - 10.5 K/uL   RBC 3.68 (L) 4.22 - 5.81 MIL/uL   Hemoglobin 9.2 (L) 13.0 - 17.0 g/dL   HCT 29.0 (L) 39.0 - 52.0 %   MCV 78.8 (L) 80.0 - 100.0 fL   MCH 25.0 (L) 26.0 - 34.0 pg   MCHC 31.7 30.0 - 36.0 g/dL   RDW 16.8 (H) 11.5 - 15.5 %   Platelets 339 150 - 400 K/uL   nRBC 0.0 0.0 - 0.2 %    Comment: Performed at [facility] [address], [place], [place] [address]  Magnesium-daily     Status: None   Collection Time: 07/03/19  2:50 AM  Result Value Ref Range   Magnesium 2.0 1.7 - 2.4 mg/dL    Comment: Performed at [facility] [address], [place], [place] [address]  CBC-5am     Status: Abnormal   Collection Time: 07/03/19  2:50 AM  Result Value Ref Range   WBC 9.6 4.0 - 10.5 K/uL    RBC 3.44 (L) 4.22 - 5.81 MIL/uL   Hemoglobin 8.7 (L) 13.0 - 17.0 g/dL   HCT 27.3 (L) 39.0 - 52.0 %   MCV 79.4 (L) 80.0 - 100.0 fL   MCH 25.3 (L) 26.0 - 34.0 pg   MCHC 31.9 30.0 - 36.0 g/dL   RDW 17.1 (H) 11.5 - 15.5 %   Platelets 313 150 - 400 K/uL   nRBC 0.0 0.0 - 0.2 %    Comment: Performed at [facility], [place] [address] [place], [place] [address]  BMP-5am     Status: Abnormal   Collection Time: 07/03/19  2:50 AM  Result Value Ref Range   Sodium 138 135 - 145 mmol/L   Potassium 4.0 3.5 - 5.1 mmol/L   Chloride 104 98 - 111 mmol/L   CO2 24 22 - 32 mmol/L   Glucose, Bld 99 70 - 99 mg/dL   BUN 14 8 - 23 mg/dL   Creatinine, Ser 1.33 (H) 0.61 - 1.24 mg/dL   Calcium 8.4 (L) 8.9 - 10.3 mg/dL   GFR calc non Af Amer 55 (L) >60 mL/min   GFR calc Af Amer >60 >60 mL/min   Anion gap 10 5 - 15    Comment: Performed at [facility] [address] [place], [place] [address]    Imaging / Studies: No results found.  Medications / Allergies: per chart  Antibiotics: Anti-infectives (From admission, onward)   Start     Dose/Rate Route Frequency Ordered Stop   06/30/19 0700  cefoTEtan (CEFOTAN) 2 g in sodium chloride 0.9 % 100 mL IVPB     2 g 200 mL/hr over 30 Minutes Intravenous On call to O.R. 06/30/19 [id number] 06/30/19 0755   06/29/19 1400  neomycin (MYCIFRADIN) tablet 1,000 mg     1,000 mg Oral 3 times per day 06/29/19 0901 06/29/19 2218   06/29/19 1400  metroNIDAZOLE (FLAGYL) tablet 1,000 mg     1,000 mg Oral 3 times per day 06/29/19 0901 06/29/19 2218   06/29/19 1000  cefoTEtan (CEFOTAN) 2 g in sodium chloride 0.9 % 100 mL IVPB     2 g 200 mL/hr over 30 Minutes Intravenous On call to O.R. 06/29/19 0901 06/30/19 0559        Note: Portions of this report may have been transcribed using voice recognition software. Every effort was made to ensure accuracy; however, inadvertent computerized transcription errors may be present.  Any transcriptional errors that result from  this process are unintentional.     Adin Hector, MD, FACS, MASCRS Gastrointestinal and Minimally Invasive Surgery    [address], [place], [place] [address] [phone] Main / Paging [phone] Fax

## 2019-07-03 NOTE — Plan of Care (Signed)
  Problem: Pain Managment: Goal: General experience of comfort will improve Outcome: Progressing   Problem: Safety: Goal: Ability to remain free from injury will improve Outcome: Progressing   Problem: Skin Integrity: Goal: Risk for impaired skin integrity will decrease Outcome: Progressing   

## 2019-07-04 LAB — CBC WITH DIFFERENTIAL/PLATELET
Abs Immature Granulocytes: 0.17 10*3/uL — ABNORMAL HIGH (ref 0.00–0.07)
Basophils Absolute: 0 10*3/uL (ref 0.0–0.1)
Basophils Relative: 0 %
Eosinophils Absolute: 0.2 10*3/uL (ref 0.0–0.5)
Eosinophils Relative: 2 %
HCT: 28.6 % — ABNORMAL LOW (ref 39.0–52.0)
Hemoglobin: 9 g/dL — ABNORMAL LOW (ref 13.0–17.0)
Immature Granulocytes: 2 %
Lymphocytes Relative: 17 %
Lymphs Abs: 1.6 10*3/uL (ref 0.7–4.0)
MCH: 25.1 pg — ABNORMAL LOW (ref 26.0–34.0)
MCHC: 31.5 g/dL (ref 30.0–36.0)
MCV: 79.7 fL — ABNORMAL LOW (ref 80.0–100.0)
Monocytes Absolute: 0.6 10*3/uL (ref 0.1–1.0)
Monocytes Relative: 6 %
Neutro Abs: 6.8 10*3/uL (ref 1.7–7.7)
Neutrophils Relative %: 73 %
Platelets: 275 10*3/uL (ref 150–400)
RBC: 3.59 MIL/uL — ABNORMAL LOW (ref 4.22–5.81)
RDW: 17.2 % — ABNORMAL HIGH (ref 11.5–15.5)
WBC: 9.3 10*3/uL (ref 4.0–10.5)
nRBC: 0 % (ref 0.0–0.2)

## 2019-07-04 LAB — COMPREHENSIVE METABOLIC PANEL
ALT: 19 U/L (ref 0–44)
AST: 18 U/L (ref 15–41)
Albumin: 2.2 g/dL — ABNORMAL LOW (ref 3.5–5.0)
Alkaline Phosphatase: 65 U/L (ref 38–126)
Anion gap: 10 (ref 5–15)
BUN: 10 mg/dL (ref 8–23)
CO2: 23 mmol/L (ref 22–32)
Calcium: 8.4 mg/dL — ABNORMAL LOW (ref 8.9–10.3)
Chloride: 106 mmol/L (ref 98–111)
Creatinine, Ser: 1.3 mg/dL — ABNORMAL HIGH (ref 0.61–1.24)
GFR calc Af Amer: >60 mL/min (ref >60)
GFR calc non Af Amer: 57 mL/min — ABNORMAL LOW (ref >60)
Glucose, Bld: 100 mg/dL — ABNORMAL HIGH (ref 70–99)
Potassium: 3.9 mmol/L (ref 3.5–5.1)
Sodium: 139 mmol/L (ref 135–145)
Total Bilirubin: 0.4 mg/dL (ref 0.3–1.2)
Total Protein: 5.3 g/dL — ABNORMAL LOW (ref 6.5–8.1)

## 2019-07-04 LAB — MAGNESIUM: Magnesium: 1.9 mg/dL (ref 1.7–2.4)

## 2019-07-04 MED ORDER — OXYCODONE HCL 5 MG PO TABS: 5.0000 mg | ORAL_TABLET | Freq: Four times a day (QID) | ORAL | 0 refills | Status: DC | PRN

## 2019-07-04 NOTE — Plan of Care (Signed)
Pt for discharge today going home, wound site dry and intact with staples open to air, alert and oriented, ambulatory, tolerates his meal, given health teachings, next appointment, due med, given all his personal belongings, discontinued peripheral IV line, no complain of pain at this time.

## 2019-07-04 NOTE — Discharge Summary (Signed)
Physician Discharge Summary  Joe Garcia [id number] DOB: 22-Feb-1951 DOA: 06/26/2019  PCP: [facility]  Admit date: 06/26/2019 Discharge date: 07/04/2019  Admitted From: Home Disposition: Home  Recommendations for Outpatient Follow-up:  1. Follow up with PCP in 1-2 weeks 2. Follow with general surgery as a scheduled. 3. Please obtain BMP/CBC in one week 4. Please follow up on the following pending results:  Home Health: None Equipment/Devices: None  Discharge Condition: Stable CODE STATUS: Full code Diet recommendation: Cardiac diet  Subjective: Seen and examined.  No complaints.  Tolerating regular diet.  Ready to go home.  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  In ED, CT abdomen and pelvis revealed circumferential wall thickening within the distal transverse colon concerning for annular apple core malignancy.    Admitted to hospital service.  GI consulted.  Had colonoscopy on 06/27/2019 concerning for malignant partially obstructing tumor in the distal transverse colon.  Biopsy obtained.  Per GI, pathology consistent with adenocarcinoma.  General surgery consulted.  Cleared by advanced heart failure team for surgery.  He was taken to the OR by Dr. Rosendo Gros, and underwent exploratory laparotomy, transverse colectomy, splenic flexure mobilization on 12/3.Marland Kitchen  Intraoperatively, found to have large mid transverse colon mass with bulky lymph nodes at the base of mesentery.  This was excised with 10 cm margins on either side followed by colo-colo anastomosis.  Final pathology results are still pending.  Postoperatively, he was started on clear liquids  and advance to regular diet which he has been tolerating very well without having any symptoms.  He has been seen by general surgery and has been cleared to go home.  He also underwent CT chest which showed 3 mm right lower lobe nodule but no signs of metastasis.  CT abdomen pelvis also did not show any metastasis.  He will be followed by general surgery about results of his pathology.  Of note, his CKD stage III remained stable.  All his antihypertensives were resumed except amlodipine and his blood pressure has remained stable so at this point in time, his amlodipine is being discontinued but he will resume rest of his prior to hospitalization medications.  He has been prescribed opioid pain medications by general surgery.  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 Active Problems:   Essential hypertension   Alcohol abuse   Hypokalemia   Chronic combined systolic (congestive) and diastolic (congestive) heart failure (HCC)   ARF (acute renal failure) (HCC)   Nausea and vomiting   Nausea & vomiting   Colonic mass   Colonic obstruction (HCC)   Chronic systolic heart failure [facility])    Discharge Instructions  Discharge Instructions    Discharge patient   Complete by: As directed    Discharge disposition: 01-Home or Self Care   Discharge patient date: 07/04/2019     Allergies as of 07/04/2019      Reactions   Lisinopril Swelling   Facial and tongue swelling 10/2012      Medication List    STOP taking these medications   amLODipine 5 MG tablet Commonly known as: NORVASC     TAKE these medications   acetaminophen 500 MG  tablet Commonly known as: TYLENOL Take 2 tablets (1,000 mg total) by mouth 3 (three) times daily as needed for headache (pain).   albuterol 108 (90 Base) MCG/ACT inhaler Commonly known as: VENTOLIN HFA Inhale 1-2 puffs into the lungs every 4 (four) hours as needed for wheezing or shortness of breath.    albuterol (2.5 MG/3ML) 0.083% nebulizer solution Commonly known as: PROVENTIL Take 3 mLs (2.5 mg total) by nebulization every 6 (six) hours as needed for wheezing or shortness of breath.   amiodarone 100 MG tablet Commonly known as: PACERONE Take 1 tablet (100 mg total) by mouth daily.   budesonide-formoterol 160-4.5 MCG/ACT inhaler Commonly known as: Symbicort Inhale 2 puffs into the lungs 2 (two) times daily.   carvedilol 3.125 MG tablet Commonly known as: COREG Take 1 tablet (3.125 mg total) by mouth 2 (two) times daily with a meal.   fluticasone 50 MCG/ACT nasal spray Commonly known as: FLONASE Place 2 sprays into both nostrils daily.   furosemide 20 MG tablet Commonly known as: LASIX Take 1 tablet (20 mg total) by mouth daily.   isosorbide-hydrALAZINE 20-37.5 MG tablet Commonly known as: BIDIL Take 0.5 tablets by mouth 3 (three) times daily.   loratadine 10 MG tablet Commonly known as: CLARITIN Take 10 mg by mouth daily.   losartan 25 MG tablet Commonly known as: COZAAR TAKE 1 TABLET BY MOUTH EVERY DAY   montelukast 10 MG tablet Commonly known as: SINGULAIR Take 10 mg by mouth at bedtime.   oxyCODONE 5 MG immediate release tablet Commonly known as: Oxy IR/ROXICODONE Take 1 tablet (5 mg total) by mouth every 6 (six) hours as needed for breakthrough pain.   polyethylene glycol powder 17 GM/SCOOP powder Commonly known as: GLYCOLAX/MIRALAX Take 17 g by mouth daily as needed for mild constipation.   spironolactone 25 MG tablet Commonly known as: ALDACTONE Take 1 tablet (25 mg total) by mouth daily.      Follow-up Information    Ralene Ok, MD. Go on 07/13/2019.   Specialty: General Surgery Why: Follow up appointment scheduled for 3:50 PM. Please arrive 30 min prior to appointment time for paperwork. Please bring your photo ID and insurance information. This will be for follow up with the surgeon and also for staple removal.  Contact information: [place] [address] [phone]        [facility] Follow up in 1 week(s).   Specialty: Family Medicine Contact information: [address] [place] [address] [phone]          Allergies  Allergen Reactions  . Lisinopril Swelling    Facial and tongue swelling 10/2012    Consultations: GI and general surgery   Procedures/Studies: Dg Chest 2 View  Result Date: 06/20/2019 CLINICAL DATA:  Cough and shortness of breath EXAM: CHEST - 2 VIEW COMPARISON:  January 29, 2019 FINDINGS: Lungs remain hyperexpanded with prominence of the retrosternal clear space. There is no edema or consolidation. Heart size and pulmonary vascularity are normal. No adenopathy. There is degenerative change in the lumbar spine. There is slight anterior wedging of several midthoracic vertebral bodies, stable. IMPRESSION: Hyperexpansion with prominence of the retrosternal clear space. These are findings indicative of underlying emphysematous change. No edema or consolidation. Stable cardiac silhouette. Electronically Signed   By: Lowella Grip III M.D.   On: 06/20/2019 13:32   Ct Chest W Contrast  Result Date: 06/28/2019 CLINICAL DATA:  Preop. Known colon mass. Incomplete colonoscopy.  EXAM: CT CHEST WITH CONTRAST TECHNIQUE: Multidetector CT imaging of the chest was performed during intravenous contrast administration. CONTRAST:  45mL OMNIPAQUE IOHEXOL 300 MG/ML  SOLN COMPARISON:  10/03/2018 FINDINGS: Cardiovascular: The heart size appears within normal limits. No pericardial effusion identified. Aortic atherosclerosis. Mediastinum/Nodes: No enlarged mediastinal, hilar, or axillary lymph nodes. Thyroid gland, trachea, and esophagus demonstrate no significant findings. Lungs/Pleura: No pleural effusion identified. No airspace consolidation, atelectasis or pneumothorax identified. 3 mm nodule in the posteromedial right lower lobe is identified, image 74/3. Unchanged.  Upper Abdomen: No acute abnormality identified. Scarring involving left kidney is again identified. Chronic right renal atrophy. Musculoskeletal: No chest wall abnormality. No acute or significant osseous findings. IMPRESSION: 1. No active cardiopulmonary abnormalities. No specific findings identified to suggest metastatic disease to the chest. 2. Unchanged 3 mm posteromedial right lower lobe lung nodule. 3. Aortic atherosclerosis. Aortic Atherosclerosis (ICD10-I70.0). Electronically Signed   By: Kerby Moors M.D.   On: 06/28/2019 09:53   Ct Abdomen Pelvis W Contrast  Result Date: 06/26/2019 CLINICAL DATA:  Generalized abdominal pain EXAM: CT ABDOMEN AND PELVIS WITH CONTRAST TECHNIQUE: Multidetector CT imaging of the abdomen and pelvis was performed using the standard protocol following bolus administration of intravenous contrast. CONTRAST:  143mL OMNIPAQUE IOHEXOL 300 MG/ML  SOLN COMPARISON:  10/03/2018 FINDINGS: Lower chest: Lung bases are clear. No effusions. Heart is normal size. Hepatobiliary: No focal hepatic abnormality. Gallbladder unremarkable. Pancreas: No focal abnormality or ductal dilatation. Spleen: No focal abnormality.  Normal size. Adrenals/Urinary Tract: No renal or adrenal mass. Right kidney is severely atrophic and calcified. No hydronephrosis. Urinary bladder unremarkable. Stomach/Bowel: There is an abnormal segment of colon in the distal transverse colon with marked wall thickening. Colon proximal to this is dilated. 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 Small bowel is decompressed. Stomach is unremarkable. Vascular/Lymphatic: No evidence of aneurysm or adenopathy. Scattered calcifications. Reproductive: No visible focal abnormality. Other: No free fluid or free air. Musculoskeletal: Degenerative changes in the hips. No acute bony abnormality. IMPRESSION: Circumferential area of short segment wall thickening within the distal transverse colon extending for  approximately 6 cm with dilated, distended colon proximal to this level. 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 This appears partially obstructive. Aortic atherosclerosis. Severely atrophic, calcified right kidney. Electronically Signed   By: Rolm Baptise M.D.   On: 06/26/2019 03:18   Dg Chest Port 1 View  Result Date: 06/26/2019 CLINICAL DATA:  Nausea and vomiting. EXAM: PORTABLE CHEST 1 VIEW COMPARISON:  06/20/2019 FINDINGS: The cardiomediastinal silhouette is unchanged with normal heart size. No airspace consolidation, edema, pleural effusion, pneumothorax is identified. Aortic atherosclerosis and thoracic spondylosis are noted. IMPRESSION: No active disease. Electronically Signed   By: Logan Bores M.D.   On: 06/26/2019 06:58     Discharge Exam: Vitals:   07/04/19 0519 07/04/19 0749  BP: 137/74   Pulse: 62   Resp: 20   Temp: 97.9 F (36.6 C)   SpO2: 97% 97%   Vitals:   07/03/19 2010 07/03/19 2110 07/04/19 0519 07/04/19 0749  BP:  (!) 145/68 137/74   Pulse:  66 62   Resp:  20 20   Temp:  98.6 F (37 C) 97.9 F (36.6 C)   TempSrc:  Oral Oral   SpO2: 98% 98% 97% 97%  Weight:      Height:        General: Pt is alert, awake, not in acute distress Cardiovascular: RRR, S1/S2 +, no rubs, no gallops Respiratory: CTA  bilaterally, no wheezing, no rhonchi Abdominal: Soft, minimal abdominal tenderness, longitudinal incision intact with staples.  No signs of infection., ND, bowel sounds + Extremities: no edema, no cyanosis    The results of significant diagnostics from this hospitalization (including imaging, microbiology, ancillary and laboratory) are listed below for reference.     Microbiology: Recent Results (from the past 240 hour(s))  SARS CORONAVIRUS 2 (TAT 6-24 HRS) Nasopharyngeal Urine, Clean Catch     Status: None   Collection Time: 06/26/19  3:22 AM   Specimen: Urine, Clean Catch; Nasopharyngeal  Result Value Ref Range Status   SARS Coronavirus  2 NEGATIVE NEGATIVE Final    Comment: (NOTE) SARS-CoV-2 target nucleic acids are NOT DETECTED. The SARS-CoV-2 RNA is generally detectable in upper and lower respiratory specimens during the acute phase of infection. Negative results do not preclude SARS-CoV-2 infection, do not rule out co-infections with other pathogens, and should not be used as the sole basis for treatment or other patient management decisions. Negative results must be combined with clinical observations, patient history, and epidemiological information. The expected result is Negative. Fact Sheet for Patients: SugarRoll.be Fact Sheet for Healthcare Providers: https://www.woods-mathews.com/ This test is not yet approved or cleared by the [place] FDA and  has been authorized for detection and/or diagnosis of SARS-CoV-2 by FDA under an Emergency Use Authorization (EUA). This EUA will remain  in effect (meaning this test can be used) for the duration of the COVID-19 declaration under Section 56 4(b)(1) of the Act, 21 U.S.C. section 360bbb-3(b)(1), unless the authorization is terminated or revoked sooner. Performed at [facility], [place] [address] [place], [place] [address]   Surgical pcr screen     Status: None   Collection Time: 06/28/19  3:05 PM   Specimen: Nasal Mucosa; Nasal Swab  Result Value Ref Range Status   MRSA, PCR NEGATIVE NEGATIVE Final   Staphylococcus aureus NEGATIVE NEGATIVE Final    Comment: (NOTE) The Xpert SA Assay (FDA approved for NASAL specimens in patients 57 years of age and older), is one component of a comprehensive surveillance program. It is not intended to diagnose infection nor to guide or monitor treatment. Performed at [facility], [place] [address], [place], [place] [address]      Labs: BNP (last 3 results) Recent Labs    07/14/18 1322 10/03/18 0811 11/24/18 2355  BNP 236.6* 106.5* 123XX123*   Basic Metabolic  Panel: Recent Labs  Lab 06/29/19 0334 06/30/19 0137 06/30/19 1049 07/01/19 0200 07/02/19 0216 07/03/19 0250 07/04/19 0231  NA 136  --   --  137 137 138 139  K 3.8  --   --  5.0 4.3 4.0 3.9  CL 101  --   --  107 103 104 106  CO2 23  --   --  22 24 24 23   GLUCOSE 108*  --   --  161* 105* 99 100*  BUN 14  --   --  10 16 14 10   CREATININE 1.58*  --  1.39* 1.32* 1.46* 1.33* 1.30*  CALCIUM 8.3*  --   --  8.2* 8.3* 8.4* 8.4*  MG 2.0 2.2  --  2.1 2.1 2.0 1.9   Liver Function Tests: Recent Labs  Lab 07/04/19 0231  AST 18  ALT 19  ALKPHOS 65  BILITOT 0.4  PROT 5.3*  ALBUMIN 2.2*   No results for input(s): LIPASE, AMYLASE in the last 168 hours. No results for input(s): AMMONIA in the last 168  hours. CBC: Recent Labs  Lab 06/28/19 0215 06/29/19 0334 06/30/19 1049 07/01/19 0200 07/02/19 0216 07/03/19 0250 07/04/19 0231  WBC 11.0* 16.9* 15.9* 14.0* 11.7* 9.6 9.3  NEUTROABS 8.3* 13.1*  --   --   --   --  6.8  HGB 9.4* 9.7* 10.6* 9.4* 9.2* 8.7* 9.0*  HCT 29.7* 29.7* 33.2* 29.4* 29.0* 27.3* 28.6*  MCV 76.7* 76.9* 77.0* 77.4* 78.8* 79.4* 79.7*  PLT 319 293 382 343 339 313 275   Cardiac Enzymes: No results for input(s): CKTOTAL, CKMB, CKMBINDEX, TROPONINI in the last 168 hours. BNP: Invalid input(s): POCBNP CBG: No results for input(s): GLUCAP in the last 168 hours. D-Dimer No results for input(s): DDIMER in the last 72 hours. Hgb A1c No results for input(s): HGBA1C in the last 72 hours. Lipid Profile No results for input(s): CHOL, HDL, LDLCALC, TRIG, CHOLHDL, LDLDIRECT in the last 72 hours. Thyroid function studies No results for input(s): TSH, T4TOTAL, T3FREE, THYROIDAB in the last 72 hours.  Invalid input(s): FREET3 Anemia work up No results for input(s): VITAMINB12, FOLATE, FERRITIN, TIBC, IRON, RETICCTPCT in the last 72 hours. Urinalysis    Component Value Date/Time   COLORURINE STRAW (A) 06/26/2019 0051   APPEARANCEUR CLEAR 06/26/2019 0051   LABSPEC 1.025  06/26/2019 0051   PHURINE 6.0 06/26/2019 0051   GLUCOSEU NEGATIVE 06/26/2019 0051   HGBUR NEGATIVE 06/26/2019 0051   BILIRUBINUR NEGATIVE 06/26/2019 0051   KETONESUR NEGATIVE 06/26/2019 0051   PROTEINUR NEGATIVE 06/26/2019 0051   UROBILINOGEN 1.0 07/16/2013 1404   NITRITE NEGATIVE 06/26/2019 0051   LEUKOCYTESUR NEGATIVE 06/26/2019 0051   Sepsis Labs Invalid input(s): PROCALCITONIN,  WBC,  LACTICIDVEN Microbiology Recent Results (from the past 240 hour(s))  SARS CORONAVIRUS 2 (TAT 6-24 HRS) Nasopharyngeal Urine, Clean Catch     Status: None   Collection Time: 06/26/19  3:22 AM   Specimen: Urine, Clean Catch; Nasopharyngeal  Result Value Ref Range Status   SARS Coronavirus 2 NEGATIVE NEGATIVE Final    Comment: (NOTE) SARS-CoV-2 target nucleic acids are NOT DETECTED. The SARS-CoV-2 RNA is generally detectable in upper and lower respiratory specimens during the acute phase of infection. Negative results do not preclude SARS-CoV-2 infection, do not rule out co-infections with other pathogens, and should not be used as the sole basis for treatment or other patient management decisions. Negative results must be combined with clinical observations, patient history, and epidemiological information. The expected result is Negative. Fact Sheet for Patients: SugarRoll.be Fact Sheet for Healthcare Providers: https://www.woods-mathews.com/ This test is not yet approved or cleared by the [place] FDA and  has been authorized for detection and/or diagnosis of SARS-CoV-2 by FDA under an Emergency Use Authorization (EUA). This EUA will remain  in effect (meaning this test can be used) for the duration of the COVID-19 declaration under Section 56 4(b)(1) of the Act, 21 U.S.C. section 360bbb-3(b)(1), unless the authorization is terminated or revoked sooner. Performed at [facility], [place] [address] [place],  [address]   Surgical pcr  screen     Status: None   Collection Time: 06/28/19  3:05 PM   Specimen: Nasal Mucosa; Nasal Swab  Result Value Ref Range Status   MRSA, PCR NEGATIVE NEGATIVE Final   Staphylococcus aureus NEGATIVE NEGATIVE Final    Comment: (NOTE) The Xpert SA Assay (FDA approved for NASAL specimens in patients 19 years of age and older), is one component of a comprehensive surveillance program. It is not intended to diagnose infection nor to guide or  monitor treatment. Performed at [facility], [place] [address], [place], [place] [address]      Time coordinating discharge: Over 30 minutes  SIGNED:   Darliss Cheney, MD  [facility] 07/04/2019, 10:55 AM  If 7PM-7AM, please contact night-coverage [email or web address] Password [email or web address]

## 2019-07-04 NOTE — Progress Notes (Signed)
4 Days Post-Op  Subjective: CC: Patient tolerating diet without any nausea or vomiting.  He had a BM this morning.  Passing flatus.  Pain well controlled. Voiding without difficulty.   Objective: Vital signs in last 24 hours: Temp:  [97.9 F (36.6 C)-98.6 F (37 C)] 97.9 F (36.6 C) (12/07 0519) Pulse Rate:  [62-66] 62 (12/07 0519) Resp:  [15-20] 20 (12/07 0519) BP: (129-145)/(68-74) 137/74 (12/07 0519) SpO2:  [97 %-100 %] 97 % (12/07 0749) Last BM Date: 07/03/19  Intake/Output from previous day: No intake/output data recorded. Intake/Output this shift: Total I/O In: 283 [P.O.:280; I.V.:3] Out: -   PE: Gen:  Alert, NAD, pleasant HEENT: EOM's intact, pupils equal and round Card:  RRR, no M/G/R heard Pulm:  CTAB, no W/R/R, effort normal. Pulling 2000 on IS Abd: Soft, ND, appropriately tender and very minimal, +BS. Midline incision with staples in place, c/d/i Ext:  No LE edema  Psych: A&Ox3  Skin: no rashes noted, warm and dry  Lab Results:  Recent Labs    07/03/19 0250 07/04/19 0231  WBC 9.6 9.3  HGB 8.7* 9.0*  HCT 27.3* 28.6*  PLT 313 275   BMET Recent Labs    07/03/19 0250 07/04/19 0231  NA 138 139  K 4.0 3.9  CL 104 106  CO2 24 23  GLUCOSE 99 100*  BUN 14 10  CREATININE 1.33* 1.30*  CALCIUM 8.4* 8.4*   PT/INR No results for input(s): LABPROT, INR in the last 72 hours. CMP     Component Value Date/Time   NA 139 07/04/2019 0231   K 3.9 07/04/2019 0231   CL 106 07/04/2019 0231   CO2 23 07/04/2019 0231   GLUCOSE 100 (H) 07/04/2019 0231   BUN 10 07/04/2019 0231   CREATININE 1.30 (H) 07/04/2019 0231   CALCIUM 8.4 (L) 07/04/2019 0231   PROT 5.3 (L) 07/04/2019 0231   ALBUMIN 2.2 (L) 07/04/2019 0231   AST 18 07/04/2019 0231   ALT 19 07/04/2019 0231   ALKPHOS 65 07/04/2019 0231   BILITOT 0.4 07/04/2019 0231   GFRNONAA 57 (L) 07/04/2019 0231   GFRAA >60 07/04/2019 0231   Lipase     Component Value Date/Time   LIPASE 24 06/26/2019 0055       Studies/Results: No results found.  Anti-infectives: Anti-infectives (From admission, onward)   Start     Dose/Rate Route Frequency Ordered Stop   06/30/19 0700  cefoTEtan (CEFOTAN) 2 g in sodium chloride 0.9 % 100 mL IVPB     2 g 200 mL/hr over 30 Minutes Intravenous On call to O.R. 06/30/19 [id number] 06/30/19 0755   06/29/19 1400  neomycin (MYCIFRADIN) tablet 1,000 mg     1,000 mg Oral 3 times per day 06/29/19 0901 06/29/19 2218   06/29/19 1400  metroNIDAZOLE (FLAGYL) tablet 1,000 mg     1,000 mg Oral 3 times per day 06/29/19 0901 06/29/19 2218   06/29/19 1000  cefoTEtan (CEFOTAN) 2 g in sodium chloride 0.9 % 100 mL IVPB     2 g 200 mL/hr over 30 Minutes Intravenous On call to O.R. 06/29/19 0901 06/30/19 0559       Assessment/Plan Weight loss AKI on CKD stage III Hx chronic systolic diastolic heart failure, 0000000 to 65%(ECHO12/07/2018) Anemia EtOH abuse. Asthma on inhalers Hypertension Protein calorie malnutrition  Adenocarcinoma of the transverse colon -Colonoscopy 06/27/2019, Dr. Lorne Skeens partially obstructing suspected mass lesion was found.  Positive for adenocarcinoma Exploratory laparotomy, transverse colectomy, splenic flexure mobilization, 06/30/2019 Dr.  Ralene Ok POD #4 - Path pending   FEN: Soft  [id number] preop. None currently  DVT: Lovenox, SCDs, Mobilize Follow-up: Dr. Ralene Ok for follow up and staple removal   Foley: None currently.   Plan Patient okay for d/c from a surgery standpoint. The patient is voiding well, tolerating diet, pain well controlled, vital signs stable, midline incision c/d/i with staples in place and felt stable for discharge home. Defer timing to primary team. Will send pain medicine to his pharmacy. Follow up appointment in chart.      LOS: 7 days    Jillyn Ledger , [facility], [facility] 07/04/2019, 10:05 AM Please see Amion for pager number during day hours  7:00am-4:30pm

## 2019-07-05 ENCOUNTER — Other Ambulatory Visit: Payer: Self-pay

## 2019-07-11 ENCOUNTER — Other Ambulatory Visit (HOSPITAL_COMMUNITY): Payer: Self-pay

## 2019-07-11 NOTE — Progress Notes (Signed)
Paramedicine Encounter    Patient ID: Joe Garcia, male    DOB: [birth year], 68 y.o.   MRN: [id number]   Patient Care Team: Medicine, [facility] as PCP - General [name], LCSW as Social Worker (Licensed Clinical Social Worker)  Patient Active Problem List   Diagnosis Date Noted  . Chronic systolic heart failure ([place])   . Colonic obstruction ([place])   . 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 Nausea & vomiting 06/26/2019  . Colonic mass 06/26/2019  . COPD GOLD ?  11/24/2018  . Chest pain   . NSTEMI (non-ST elevated myocardial infarction) ([place]) 10/03/2018  . COPD with acute exacerbation ([place]) 07/26/2018  . ARF (acute renal failure) ([place]) 07/26/2018  . Nausea and vomiting 07/26/2018  . Weight loss, unintentional 07/26/2018  . Malnutrition of moderate degree 07/16/2017  . 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 Solitary kidney, congenital 06/23/2017  . Nonischemic cardiomyopathy ([place])   . Multifocal PVCs   . Chronic combined systolic (congestive) and diastolic (congestive) heart failure ([place]) 06/11/2017  . Hypokalemia 05/05/2017  . DOE (dyspnea on exertion) 07/16/2013  . Alcohol abuse 09/25/2012  . Alcohol dependence ([place]) 09/22/2012  . Essential hypertension 03/22/2007  . DEGENERATIVE DISC DISEASE 03/22/2007  . AVASCULAR NECROSIS 03/22/2007    Current Outpatient Medications:  .  acetaminophen (TYLENOL) 500 MG tablet, Take 2 tablets (1,000 mg total) by mouth 3 (three) times daily as needed for headache (pain)., Disp: 30 tablet, Rfl: 0 .  albuterol (PROVENTIL) (2.5 MG/3ML) 0.083% nebulizer solution, Take 3 mLs (2.5 mg total) by nebulization every 6 (six) hours as needed for wheezing or shortness of breath., Disp: 75 mL, Rfl: 0 .  albuterol (VENTOLIN HFA) 108 (90 Base) MCG/ACT inhaler, Inhale 1-2 puffs into the lungs every 4 (four) hours as needed for wheezing or shortness of breath., Disp: 18 g, Rfl: 0 .  amiodarone  (PACERONE) 100 MG tablet, Take 1 tablet (100 mg total) by mouth daily., Disp: 30 tablet, Rfl: 6 .  budesonide-formoterol (SYMBICORT) 160-4.5 MCG/ACT inhaler, Inhale 2 puffs into the lungs 2 (two) times daily., Disp: 1 Inhaler, Rfl: 11 .  carvedilol (COREG) 3.125 MG tablet, Take 1 tablet (3.125 mg total) by mouth 2 (two) times daily with a meal., Disp: 60 tablet, Rfl: 5 .  fluticasone (FLONASE) 50 MCG/ACT nasal spray, Place 2 sprays into both nostrils daily., Disp: 16 g, Rfl: 0 .  furosemide (LASIX) 20 MG tablet, Take 1 tablet (20 mg total) by mouth daily., Disp: 30 tablet, Rfl: 5 .  isosorbide-hydrALAZINE (BIDIL) 20-37.5 MG tablet, Take 0.5 tablets by mouth 3 (three) times daily. , Disp: , Rfl:  .  losartan (COZAAR) 25 MG tablet, TAKE 1 TABLET BY MOUTH EVERY DAY (Patient taking differently: Take 25 mg by mouth at bedtime. ), Disp: 30 tablet, Rfl: 6 .  montelukast (SINGULAIR) 10 MG tablet, Take 10 mg by mouth at bedtime., Disp: , Rfl:  .  oxyCODONE (OXY IR/ROXICODONE) 5 MG immediate release tablet, Take 1 tablet (5 mg total) by mouth every 6 (six) hours as needed for breakthrough pain., Disp: 15 tablet, Rfl: 0 .  polyethylene glycol powder (GLYCOLAX/MIRALAX) powder, Take 17 g by mouth daily as needed for mild constipation., Disp: , Rfl:  .  spironolactone (ALDACTONE) 25 MG tablet, Take 1 tablet (25 mg total) by mouth daily., Disp: 30 tablet, Rfl: 3 .  loratadine (CLARITIN) 10 MG tablet, Take 10 mg by mouth daily., Disp: ,  Rfl:  Allergies  Allergen Reactions  . Lisinopril Swelling    Facial and tongue swelling 10/2012      Social History   Socioeconomic History  . Marital status: Single    Spouse name: Not on file  . Number of children: Not on file  . Years of education: Not on file  . Highest education level: Not on file  Occupational History  . Occupation: "it don't work for me", on disability  Tobacco Use  . Smoking status: Former Smoker    Packs/day: 0.10    Years: 45.00    Pack  years: 4.50    Quit date: 2012    Years since quitting: 8.9  . Smokeless tobacco: Never Used  Substance and Sexual Activity  . Alcohol use: No    Comment: Pt states no etoh since April 2014  . Drug use: No    Comment: Prior use of crack cocaine, quit 2012  . Sexual activity: Never  Other Topics Concern  . Not on file  Social History Narrative  . Not on file   Social Determinants of Health   Financial Resource Strain:   . Difficulty of Paying Living Expenses: Not on file  Food Insecurity:   . Worried About Charity fundraiser in the Last Year: Not on file  . Ran Out of Food in the Last Year: Not on file  Transportation Needs:   . Lack of Transportation (Medical): Not on file  . Lack of Transportation (Non-Medical): Not on file  Physical Activity:   . Days of Exercise per Week: Not on file  . Minutes of Exercise per Session: Not on file  Stress:   . Feeling of Stress : Not on file  Social Connections:   . Frequency of Communication with Friends and Family: Not on file  . Frequency of Social Gatherings with Friends and Family: Not on file  . Attends Religious Services: Not on file  . Active Member of Clubs or Organizations: Not on file  . Attends Archivist Meetings: Not on file  . Marital Status: Not on file  Intimate Partner Violence:   . Fear of Current or Ex-Partner: Not on file  . Emotionally Abused: Not on file  . Physically Abused: Not on file  . Sexually Abused: Not on file    Physical Exam      No future appointments.  BP (!) 150/80   Pulse 72   Resp 16   Wt 137 lb (62.1 kg)   SpO2 99%   BMI 19.11 kg/m   Weight yesterday-138 Last visit weight-147   Pt recently out of hosp--d/c with colon cancer/surgery while in hosp.  He states he is feeling better. He states he just cant eat much.  He reports he is having pains at the surgery site.  He is also having abd pains as well.   He is prescribed oxy but scared to take it due to the  addiction that could possibly come from taking it.  He has used them when absolute necessary.  He reports LBM was today. He did have issues with constipation but that was resolved.  He reports his breathing have been up and down.  He denies c/p, he has a lot of episodes of dizziness.  Mainly this occurs when he stands up.  He states he isnt eating like he did before--poor appetite. Has to force himself to eat.  States he is drinking plenty of water.  Out of the miralax. --son  is suppose to go pick that up for him.  Out of spiro--called in that for him--pharmacy has it already filled and ready for pick up.  He states he still has staples to his surgery incision--he denies any bleeding or drainage from the area.  He goes Wednesday to get his staples removed and for f/u.  B/p elevated--he is due for his next dose of bidil.    Pt pill box checked, meds verified.  Will f/u in a couple wks.   Marylouise Stacks, [facility] Paramedic  07/11/19

## 2019-07-13 ENCOUNTER — Encounter (HOSPITAL_COMMUNITY): Payer: Self-pay | Admitting: General Surgery

## 2019-07-14 ENCOUNTER — Telehealth: Payer: Self-pay | Admitting: Oncology

## 2019-07-14 NOTE — Telephone Encounter (Signed)
Received a new patient referral from Dr. Rosendo Gros at [facility] for colon cancer. Joe Garcia has been cld and scheduled to see Dr. [name] on 12/24 at 930am. Pt has been made aware to arrive 15 minutes early.

## 2019-07-19 ENCOUNTER — Encounter (HOSPITAL_COMMUNITY): Payer: Self-pay

## 2019-07-21 ENCOUNTER — Inpatient Hospital Stay: Payer: Medicare Other | Attending: Oncology | Admitting: Oncology

## 2019-07-21 ENCOUNTER — Encounter: Payer: Self-pay | Admitting: *Deleted

## 2019-07-21 ENCOUNTER — Telehealth: Payer: Self-pay | Admitting: General Practice

## 2019-07-21 ENCOUNTER — Other Ambulatory Visit: Payer: Self-pay

## 2019-07-21 ENCOUNTER — Other Ambulatory Visit: Payer: Self-pay | Admitting: Oncology

## 2019-07-21 VITALS — BP 160/89 | HR 75 | Temp 98.2°F | Resp 18 | Ht 71.0 in | Wt 148.0 lb

## 2019-07-21 DIAGNOSIS — C184 Malignant neoplasm of transverse colon: Secondary | ICD-10-CM

## 2019-07-21 DIAGNOSIS — N261 Atrophy of kidney (terminal): Secondary | ICD-10-CM

## 2019-07-21 DIAGNOSIS — D649 Anemia, unspecified: Secondary | ICD-10-CM

## 2019-07-21 DIAGNOSIS — J45909 Unspecified asthma, uncomplicated: Secondary | ICD-10-CM

## 2019-07-21 DIAGNOSIS — C778 Secondary and unspecified malignant neoplasm of lymph nodes of multiple regions: Secondary | ICD-10-CM | POA: Diagnosis not present

## 2019-07-21 DIAGNOSIS — Z809 Family history of malignant neoplasm, unspecified: Secondary | ICD-10-CM

## 2019-07-21 DIAGNOSIS — I5022 Chronic systolic (congestive) heart failure: Secondary | ICD-10-CM | POA: Diagnosis not present

## 2019-07-21 DIAGNOSIS — Z79899 Other long term (current) drug therapy: Secondary | ICD-10-CM

## 2019-07-21 NOTE — Research (Signed)
07/21/2019 Research - [facility] [phone] research study - initial consent encounter Met with patient x 20 minutes to review the study information overview. Patient was unaccompanied today; patient is unable to read but states that his son and other family members can assist him with reading the information. Provided a brief overview of the treatment plan control arm (chemotherapy as would be administered off trial) and investigational arm (same chemotherapy with an additional investigational drug). Discussed expected duration of treatment for each arm as well as planned visit schedule and assessments, including assessments during the follow-up phase. Explained to patient that he will also receive additional education regarding the treatment and side effects during his chemotherapy education session. Discussed optional substudies that are part of this research study as well, including home collection of stool samples and optional blood sample collections. Patient understands that study participation is completely voluntary, and that if enrolled, he is also free to change his mind at any time. Noted that we would make all efforts to work with him to complete study treatment as prescribed. Copies of the consent and authorization forms, along with the IRB-approved patient brochure, the [facility] brochure and my business card were given to the patient to allow him to review these with his family members. Patient is aware that he can expect to be contacted in the next week by a research nurse regarding his interest in the study. Thanked patient for his willingness to consider this trial.  Gwynn Burly. Brigitte Pulse BSN, RN, [place] 07/21/2019 11:16 AM

## 2019-07-21 NOTE — Telephone Encounter (Signed)
[facility] Initial Psychosocial Assessment Clinical Social Work  Clinical Social Work contacted by phone to assess psychosocial, emotional, mental health, and spiritual needs of the patient.   Barriers to care/review of distress screen:  - Transportation:  Do you anticipate any problems getting to appointments?  Do you have someone who can help run errands for you if you need it?  Does not drive, son can take him occasionally.  Son works.  Would like help w transportation.   - Help at home:  What is your living situation (alone, family, other)?  If you are physically unable to care for yourself, who would you call on to help you?  Lives alone.  Has someone who comes "every now and then" who helps.  "People are scared to come around others due to COVID."  When asked about ADLs, says "I have to do everything on a slow base."  Has difficulty showering.  Would be interested in referral for [facility].  - Support system:  What does your support system look like?  Who would you call on if you needed some kind of practical help?  What if you needed someone to talk to for emotional support?  Limited support from children.  "All of them work, some are married, cant depend on them."  Will need to be enrolled in [place]:  Are you concerned about finances  Considering returning to work?  If not, applying for disability?  Is not working, is on disability.  Can apply for [facility]ira when he has treatment plan in place.  Gets Food Stamps, aware that amount will decrease next month.  Recently recertified and his income had increased.     CSW Summary:  Patient and family psychosocial functioning including strengths, limitations, and coping skills:  New diagnosis of colon cancer.  Treatment plans TBD.  Will need help with transportation to/from appointments. May need referral for [facility].  Limited social support, low literacy.  Admits "I cant hardly  read."    Identifications of barriers to care:  Limited literacy, lack of transportation, potential food insecurity.    Availability of community resources:  SM sent to Estate manager/land agent and email to [employer] as both are needs.  SW Team alerted to food insecurity issues.    Clinical Social Worker follow up needed: Yes.   please call as needed, will monitor when appointments are in place.

## 2019-07-21 NOTE — Progress Notes (Signed)
[facility] New Patient Consult   Requesting MD: Kellon Chalk 68 y.o.  24-Oct-1950    Reason for Consult: Lung cancer   HPI: Mr. Hisaw reports intermittent nominal pain for approximately 5 months prior to presenting to the emergency room on 06/26/2019.  Pain improved after having bowel movements.  The pain became more severe with associated nausea/vomiting and constipation. A CT of the abdomen and pelvis on 06/26/2019 revealed no focal hepatic abnormality.  The right kidney is atrophic and calcified.  An abnormal segment of colon was noted in the distal transverse colon with marked wall thickening.  The colon proximal to the lesion is dilated.  No adenopathy. He was admitted.  Gastroenterology was consulted.  He was taken to a cold but [facility] on 06/27/2019.  A partially obstructing mass was found in the distal transverse colon.  The lesion could not be traversed.  Biopsies were obtained.  The biopsy confirmed adenocarcinoma.  A CT of the chest on 06/28/2019 revealed a stable 3 mm right lower lobe nodule compared to a CT from 10/03/2018.   He was taken to the operating room by Dr. Rosendo Gros for a transverse colectomy on 06/30/2019.  A large mid transverse colon mass with bulky lymph nodes at the base of the mesentery noted.  The mass was excised with a colo-coloanastomosis.  The pathology ([id number]) revealed a grade 3 adenocarcinoma of the mid transverse colon.  Tumor invaded into adherent omentum.  The Koza resection margins are negative.  The mesenteric margin had a positive lymph node.  Lymphovascular and perineural invasion are present.  Macroscopic tumor perforation.  No tumor deposit.  4 of 18 lymph nodes contained metastatic carcinoma.  There is loss of MLH1 and PMS2 expression.  The tumor returned MSI high.  Mr. Berkel was discharged from the hospital on 07/04/2019.  No pain has resolved. Past Medical History:  Diagnosis Date  . Alcoholism  ([place])   . Arthritis    hips, L shoulder  . Asthma   . CHF (congestive heart failure) ([place])   . Depression   . Family history of anesthesia complication   . Hyperlipidemia   . Hypertension     .  Colon cancer, transverse colon T4N2                                                                                 06/30/2019  Past Surgical History:  Procedure Laterality Date  . BIOPSY  06/27/2019   Procedure: BIOPSY;  Surgeon: Yetta Flock, MD;  Location: [facility] ENDOSCOPY;  Service: Gastroenterology;;  . COLONOSCOPY Left 06/27/2019   Procedure: COLONOSCOPY;  Surgeon: Yetta Flock, MD;  Location: [facility];  Service: Gastroenterology;  Laterality: Left;  . LAPAROTOMY N/A 06/30/2019   Procedure: EXPLORATORY LAPAROTOMY;  Surgeon: Ralene Ok, MD;  Location: [facility];  Service: General;  Laterality: N/A;  . LEFT HEART CATH AND CORONARY ANGIOGRAPHY N/A 10/04/2018   Procedure: LEFT HEART CATH AND CORONARY ANGIOGRAPHY;  Surgeon: Jettie Booze, MD;  Location: [facility] CV LAB;  Service: Cardiovascular;  Laterality: N/A;  . PARTIAL COLECTOMY N/A 06/30/2019   Procedure: PARTIAL COLECTOMY;  Surgeon: Ralene Ok, MD;  Location: [facility] OR;  Service: General;  Laterality: N/A;  . RIGHT/LEFT HEART CATH AND CORONARY ANGIOGRAPHY N/A 06/12/2017   Procedure: RIGHT/LEFT HEART CATH AND CORONARY ANGIOGRAPHY;  Surgeon: Jolaine Artist, MD;  Location: [facility] CV LAB;  Service: Cardiovascular;  Laterality: N/A;    Medications: Reviewed  Allergies:  Allergies  Allergen Reactions  . Lisinopril Swelling    Facial and tongue swelling 10/2012    Family history: His daughter died at age from a cancer that started in her right leg.  Social History:   He lives alone in [place].  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  He quit smoking cigarettes and drinking alcohol 10 years ago.  No transfusion history.  No risk factor for HIV or hepatitis.  ROS:   Positives include:  Occasional night sweats, nausea and vomiting prior to colon surgery, bleeding from the gums, wheezing and exertional dyspnea, "lung "in the right low lateral thigh for 10 years, constipated prior to colon surgery, spraying of urinary stream requiring him to urinate while sitting  A complete ROS was otherwise negative.  Physical Exam:  Blood pressure (!) 160/89, pulse 75, temperature 98.2 F (36.8 C), temperature source Temporal, resp. rate 18, height 5' 11" (1.803 m), weight 148 lb (67.1 kg), SpO2 100 %.  HEENT: Neck without mass Lungs: Scattered end inspiratory and expiratory wheeze, no respiratory distress Cardiac: Regular rate and rhythm Abdomen: No mass, no hepatosplenomegaly, nontender, healed midline incision  Vascular: No leg edema Lymph nodes: No cervical, supraclavicular, axillary, or inguinal nodes Neurologic: Alert and oriented, the motor exam appears intact in the upper and lower extremities bilaterally Skin: No rash Musculoskeletal: No palpable mass at the distal lateral right thigh, no spine tenderness   LAB:  CBC  Lab Results  Component Value Date   WBC 9.3 07/04/2019   HGB 9.0 (L) 07/04/2019   HCT 28.6 (L) 07/04/2019   MCV 79.7 (L) 07/04/2019   PLT 275 07/04/2019   NEUTROABS 6.8 07/04/2019        CMP  Lab Results  Component Value Date   NA 139 07/04/2019   K 3.9 07/04/2019   CL 106 07/04/2019   CO2 23 07/04/2019   GLUCOSE 100 (H) 07/04/2019   BUN 10 07/04/2019   CREATININE 1.30 (H) 07/04/2019   CALCIUM 8.4 (L) 07/04/2019   PROT 5.3 (L) 07/04/2019   ALBUMIN 2.2 (L) 07/04/2019   AST 18 07/04/2019   ALT 19 07/04/2019   ALKPHOS 65 07/04/2019   BILITOT 0.4 07/04/2019   GFRNONAA 57 (L) 07/04/2019   GFRAA >60 07/04/2019     Lab Results  Component Value Date   CEA1 1.4 06/27/2019    Imaging: CT abdomen/pelvis 06/26/2019 and CT chest 06/28/2019-images reviewed     Assessment/Plan:   1. Colon cancer, transverse colon, stage IIIc, T4bN2,  status post a partial transverse colectomy 06/30/2019  Grade 3, lymphovascular and perineural invasion present, 4/18 lymph nodes positive, positive lymph nodes mesenteric margin, tumor invades into adherent omentum, MSI high, loss of MLH1 and PMS2 expression  CT abdomen/pelvis 06/26/2019-thickening within the distal transverse colon, severely atrophic and calcified right kidney  CT chest 06/28/2019-no evidence of metastatic disease, stable 3 mm right lower lobe nodule 2. Asthma 3. 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 Microcytic anemia, likely iron deficiency anemia secondary to #1   Disposition:   Mr. 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  I discussed the prognosis  and reviewed details of the surgical pathology report with him.  He has a significant chance of developing recurrent colon cancer over the next few years.    I reviewed the expected improvement in disease-free and overall survival with adjuvant 5-fluorouracil and oxaliplatin based chemotherapy in this setting.  I recommend adjuvant FOLFOX as standard therapy.  The tumor has an MSI high phenotype.  We discussed the Atomic study which randomizes patients to FOLFOX +/-atezolizumab.  He is interested in enrollment on this study and will meet with a research nurse to further review the study.  We reviewed potential toxicities associated with the FOLFOX regimen including the chance for nausea/vomiting, mucositis, diarrhea, alopecia, and hematologic toxicity.  We discussed the sun sensitivity, rash, hyperpigmentation, and hand/foot syndrome associated with 5-fluorouracil.  We reviewed the allergic reaction and various types of neuropathy seen with oxaliplatin.  We discussed the rash, diarrhea, pneumonitis, hepatitis, hypothyroidism, and other autoimmune toxicities atezolizumab.  He agrees to proceed with treatment.  He will attend a chemotherapy teaching  class.  Mr. Veasey most likely does not have hereditary nonpolyposis colon cancer syndrome, but we will submit the tumor per MLH1 methylation testing to confirm this.  His family members are at increased risk of developing cancer and should receive appropriate screening.  Mr. Going is unable to repeat.  We will ask him to bring a family member to the chemotherapy teaching class.  I will refer him to Dr. Rosendo Gros for Port-A-Cath placement.  The plan is to begin FOLFOX on 08/04/2019.   Betsy Coder, MD  07/21/2019, 9:40 AM

## 2019-07-22 ENCOUNTER — Ambulatory Visit: Payer: Self-pay | Admitting: General Surgery

## 2019-07-26 ENCOUNTER — Inpatient Hospital Stay: Payer: Medicare Other

## 2019-07-26 ENCOUNTER — Other Ambulatory Visit: Payer: Self-pay

## 2019-07-26 ENCOUNTER — Telehealth: Payer: Self-pay | Admitting: *Deleted

## 2019-07-26 DIAGNOSIS — C184 Malignant neoplasm of transverse colon: Secondary | ICD-10-CM

## 2019-07-26 LAB — CBC WITH DIFFERENTIAL (CANCER CENTER ONLY)
Abs Immature Granulocytes: 0.01 10*3/uL (ref 0.00–0.07)
Basophils Absolute: 0 10*3/uL (ref 0.0–0.1)
Basophils Relative: 1 %
Eosinophils Absolute: 0.1 10*3/uL (ref 0.0–0.5)
Eosinophils Relative: 2 %
HCT: 37.9 % — ABNORMAL LOW (ref 39.0–52.0)
Hemoglobin: 12.2 g/dL — ABNORMAL LOW (ref 13.0–17.0)
Immature Granulocytes: 0 %
Lymphocytes Relative: 39 %
Lymphs Abs: 1.8 10*3/uL (ref 0.7–4.0)
MCH: 26.4 pg (ref 26.0–34.0)
MCHC: 32.2 g/dL (ref 30.0–36.0)
MCV: 82 fL (ref 80.0–100.0)
Monocytes Absolute: 0.4 10*3/uL (ref 0.1–1.0)
Monocytes Relative: 8 %
Neutro Abs: 2.4 10*3/uL (ref 1.7–7.7)
Neutrophils Relative %: 50 %
Platelet Count: 312 10*3/uL (ref 150–400)
RBC: 4.62 MIL/uL (ref 4.22–5.81)
RDW: 20.3 % — ABNORMAL HIGH (ref 11.5–15.5)
WBC Count: 4.7 10*3/uL (ref 4.0–10.5)
nRBC: 0 % (ref 0.0–0.2)

## 2019-07-26 LAB — CMP (CANCER CENTER ONLY)
ALT: 20 U/L (ref 0–44)
AST: 13 U/L — ABNORMAL LOW (ref 15–41)
Albumin: 3.8 g/dL (ref 3.5–5.0)
Alkaline Phosphatase: 78 U/L (ref 38–126)
Anion gap: 10 (ref 5–15)
BUN: 30 mg/dL — ABNORMAL HIGH (ref 8–23)
CO2: 19 mmol/L — ABNORMAL LOW (ref 22–32)
Calcium: 8.9 mg/dL (ref 8.9–10.3)
Chloride: 108 mmol/L (ref 98–111)
Creatinine: 1.72 mg/dL — ABNORMAL HIGH (ref 0.61–1.24)
GFR, Est AFR Am: 47 mL/min — ABNORMAL LOW (ref >60)
GFR, Estimated: 41 mL/min — ABNORMAL LOW (ref >60)
Glucose, Bld: 103 mg/dL — ABNORMAL HIGH (ref 70–99)
Potassium: 4.7 mmol/L (ref 3.5–5.1)
Sodium: 137 mmol/L (ref 135–145)
Total Bilirubin: 0.4 mg/dL (ref 0.3–1.2)
Total Protein: 7 g/dL (ref 6.5–8.1)

## 2019-07-26 NOTE — Telephone Encounter (Signed)
07/26/19 1:38 PM  This RN called patient Joe Garcia and spoke with him regarding the ATOMIC clinical trial.  Notified patient that he was not eligible for participation.  Patient likewise informed this RN that he had decided that he was not interested in study participation.  Patient was thanked for his time and consideration.  Doreatha Martin, RN, BSN, [facility] 07/26/2019 1:39 PM

## 2019-07-27 ENCOUNTER — Other Ambulatory Visit: Payer: Self-pay | Admitting: Oncology

## 2019-07-27 ENCOUNTER — Encounter: Payer: Self-pay | Admitting: *Deleted

## 2019-07-27 NOTE — Progress Notes (Signed)
START ON PATHWAY REGIMEN - Colorectal     A cycle is every 14 days:     Oxaliplatin      Leucovorin      Fluorouracil      Fluorouracil   **Always confirm dose/schedule in your pharmacy ordering system**  Patient Characteristics: Postoperative without Neoadjuvant Therapy (Pathologic Staging), Colon, Stage III, High Risk (pT4 or pN2) Tumor Location: Colon Therapeutic Status: Postoperative without Neoadjuvant Therapy (Pathologic Staging) AJCC M Category: Staged < 8th Ed. AJCC T Category: Staged < 8th Ed. AJCC N Category: Staged < 8th Ed. AJCC 8 Stage Grouping: Staged < 8th Ed. Intent of Therapy: Curative Intent, Discussed with Patient 

## 2019-07-27 NOTE — Progress Notes (Signed)
Patient discussed in GI Cancer Conference this AM.  Request made to pathologist to perform Methylation testing on tissue.  I called Kim in Pathology to make her aware of request.

## 2019-07-28 ENCOUNTER — Encounter (HOSPITAL_COMMUNITY): Payer: Self-pay | Admitting: General Surgery

## 2019-07-28 ENCOUNTER — Other Ambulatory Visit: Payer: Self-pay

## 2019-07-28 NOTE — Progress Notes (Signed)
Spoke with pt for pre-op call. Pt states he has hx of CHF and an irregular heart rate. Pt's cardiologist is Dr. [name]. Pt denies Diabetes.  Pt will get his Covid test done on 07/30/19 and understands that he will need to be in quarantine once he gets the test done until he comes for his surgery on Tuesday.   Pt states he has a ride to the hospital, but does not have anyone to pick him up and be with him for the 24 hours after surgery. He states he has asked "everyone" he knows.

## 2019-07-30 ENCOUNTER — Other Ambulatory Visit: Payer: Self-pay | Admitting: Oncology

## 2019-07-30 ENCOUNTER — Other Ambulatory Visit (HOSPITAL_COMMUNITY)
Admission: RE | Admit: 2019-07-30 | Discharge: 2019-07-30 | Disposition: A | Discharge status: Home / Self Care | Payer: Medicare Other | Source: Ambulatory Visit | Attending: General Surgery | Admitting: General Surgery

## 2019-07-30 DIAGNOSIS — Z20822 Contact with and (suspected) exposure to covid-19: Secondary | ICD-10-CM | POA: Diagnosis not present

## 2019-07-30 DIAGNOSIS — Z01812 Encounter for preprocedural laboratory examination: Secondary | ICD-10-CM | POA: Insufficient documentation

## 2019-07-30 LAB — SARS CORONAVIRUS 2 (TAT 6-24 HRS): SARS Coronavirus 2: NEGATIVE

## 2019-08-01 ENCOUNTER — Other Ambulatory Visit (HOSPITAL_COMMUNITY): Payer: Medicare Other

## 2019-08-01 NOTE — Anesthesia Preprocedure Evaluation (Addendum)
Anesthesia Evaluation  Patient identified by MRN, date of birth, ID band Patient awake    Reviewed: Allergy & Precautions, NPO status , Patient's Chart, lab work & pertinent test results, reviewed documented beta blocker date and time   History of Anesthesia Complications Negative for: history of anesthetic complications  Airway Mallampati: II  TM Distance: >3 FB Neck ROM: Full    Dental  (+) Dental Advisory Given, Missing,    Pulmonary asthma , COPD,  COPD inhaler, former smoker,    Pulmonary exam normal        Cardiovascular hypertension, Pt. on medications and Pt. on home beta blockers + Past MI and +CHF (resolved)  Normal cardiovascular exam+ dysrhythmias (PVCs)    '20 TTE - EF 60 to 65%. LA was mildly dilated. Trace MR. Mild aortic valve sclerosis without stenosis.  '20 Cath - LV end diastolic pressure is low. There is no aortic valve stenosis. No significant CAD.     Neuro/Psych PSYCHIATRIC DISORDERS Depression negative neurological ROS     GI/Hepatic GERD  Controlled and Medicated,(+)     substance abuse  alcohol use and cocaine use,  Colon cancer    Endo/Other  negative endocrine ROS  Renal/GU CRFRenal disease     Musculoskeletal  (+) Arthritis ,   Abdominal   Peds  Hematology  (+) anemia ,   Anesthesia Other Findings Covid neg 07/30/19   Reproductive/Obstetrics                          Anesthesia Physical Anesthesia Plan  ASA: III  Anesthesia Plan: General   Post-op Pain Management:    Induction: Intravenous  PONV Risk Score and Plan: 2 and Treatment may vary due to age or medical condition and Ondansetron  Airway Management Planned: LMA  Additional Equipment: None  Intra-op Plan:   Post-operative Plan: Extubation in OR  Informed Consent: I have reviewed the patients History and Physical, chart, labs and discussed the procedure including the risks,  benefits and alternatives for the proposed anesthesia with the patient or authorized representative who has indicated his/her understanding and acceptance.     Dental advisory given  Plan Discussed with: CRNA and Anesthesiologist  Anesthesia Plan Comments:        Anesthesia Quick Evaluation

## 2019-08-01 NOTE — Progress Notes (Signed)
Anesthesia Chart Review: Joe Garcia   Case: [id number] Date/Time: 08/02/19 1345   Procedure: INSERTION PORT-A-CATH WITH ULTRASOUND GUIDANCE (N/A )   Anesthesia type: General   Pre-op diagnosis: COLON CANCER   Location: [facility] OR ROOM 10 / [place] OR   Surgeons: Ralene Ok, MD      DISCUSSION: Patient is a 69 year old male scheduled for the above procedure. 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 Oncology has recommended FOLFOX therapy to begin on 08/04/19.  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 Stable (since 10/03/18) RML long nodule on 06/28/19 chest CT. He did not report any personal or family history of anesthesia complications at his RN pre-operative phone interview.   Last evaluation by cardiologist Dr. [name] was on 06/28/19 prior to colon resection. Follow-up echo showed normalization of EF from 30-35% to 60-65%.    He had labs on 07/26/19 per oncologist Dr. [name] that showed BUN 30, Cr 1.72, K 4.7, AST 13, ALT 20, H/H 12.2/37.9, PLT 312. Creatinine trends since 07/28/18 show range on 1.25-1.80--average Cr ~ 1.45-1.50 range. Given known CKD, I did not enter additional preoperative lab orders--defer to assigned anesthesiologist if he/she feels additional labs warranted.  Negative COVID-19 test on 07/29/18. Anesthesia team to evaluate on the day of surgery.    VS: There were no vitals taken for this visit.  BP Readings from Last 3 Encounters:  07/21/19 (!) 160/89  07/11/19 (!) 150/80  07/04/19 137/74   Pulse Readings from Last 3 Encounters:  07/21/19 75  07/11/19 72  07/04/19 62    PROVIDERS: [facility] is where he received primary care Betsy Coder, MD is HEM-ONC Glori Bickers, MD is HF cardiologist Sidell Cellar, MD and Juanita Craver, MD are GI (seedn 05/2019 during hospitalization) Christinia Gully, MD is pulmonologist. Last evaluation 11/24/18.    LABS: See DISCUSSION. Most recent lab results include: Lab Results  Component Value Date   WBC 4.7 07/26/2019   HGB 12.2 (L) 07/26/2019   HCT 37.9 (L) 07/26/2019   PLT 312 07/26/2019   GLUCOSE 103 (H) 07/26/2019   ALT 20 07/26/2019   AST 13 (L) 07/26/2019   NA 137 07/26/2019   K 4.7 07/26/2019   CL 108 07/26/2019   CREATININE 1.72 (H) 07/26/2019   BUN 30 (H) 07/26/2019   CO2 19 (L) 07/26/2019   TSH 1.823 06/26/2019   INR 0.9 06/26/2019     PFTs 04/27/19: FVC 2.25 (55%), post 2.70 (66%). FEV1 1.37 (43%), post 1.61 (51%). DLCO unc 19.72 (71%).    IMAGES: CT Chest 06/28/19: IMPRESSION: 1. No active cardiopulmonary abnormalities. No specific findings identified to suggest metastatic disease to the chest. 2. Unchanged 3 mm posteromedial right lower lobe lung nodule. 3. Aortic atherosclerosis.   EKG:  EKG 06/26/19 06:17:37: NSR EKG 06/26/19 01:48:57: SR, multiple PVCs.   CV: Echo 06/28/19: IMPRESSIONS  1. Left ventricular ejection fraction, by visual estimation, is 60 to 65%. The left ventricle has normal function. There is no left ventricular hypertrophy.  2. EF imprvoved since March 2020.  3. Global right ventricle has normal systolic function.The right ventricular size is normal. No increase in right ventricular wall thickness.  4. Left atrial size was mildly dilated.  5. Right atrial  size was normal.  6. The mitral valve is normal in structure. Trace mitral valve regurgitation. No evidence of mitral stenosis.  7. The tricuspid valve is normal in structure. Tricuspid valve regurgitation is not demonstrated.  8. The aortic valve is tricuspid. Aortic valve regurgitation is not visualized. Mild aortic  valve sclerosis without stenosis.  9. The pulmonic valve was normal in structure. Pulmonic valve regurgitation is not visualized. 10. The inferior vena cava is normal in size with greater than 50% respiratory variability, suggesting right atrial pressure of 3 mmHg.    Cardiac cath [id number]:  LV end diastolic pressure is low.  There is no aortic valve stenosis.  No significant CAD. (Angiographically normal LM, LAD, LCx, RCA) There are LV function abnormalities due to apical ballooning consistent with Takotsubo cardiomyopathy. Medical therapy for nonischemic cardiomyopathy, LV dysfunction. (EF 30-35% by 10/04/18 echo.) (NICM diagnosed 06/12/17, possible PVC induced cardiomyopathy).   Past Medical History:  Diagnosis Date  . Alcoholism ([place])   . Arthritis    hips, L shoulder  . Asthma   . Cancer [facility])    colon cancer  . CHF (congestive heart failure) ([place])   . Chronic kidney disease    only has one kidney  . Depression    pt denies  . Dyspnea   . Dysrhythmia    PVC's  . Family history of anesthesia complication   . GERD (gastroesophageal reflux disease)   . Hyperlipidemia   . Hypertension   . Pneumonia     Past Surgical History:  Procedure Laterality Date  . BIOPSY  06/27/2019   Procedure: BIOPSY;  Surgeon: Yetta Flock, MD;  Location: [facility] ENDOSCOPY;  Service: Gastroenterology;;  . COLONOSCOPY Left 06/27/2019   Procedure: COLONOSCOPY;  Surgeon: Yetta Flock, MD;  Location: [place];  Service: Gastroenterology;  Laterality: Left;  . LAPAROTOMY N/A 06/30/2019   Procedure: EXPLORATORY LAPAROTOMY;  Surgeon: Ralene Ok, MD;  Location: [place];  Service: General;  Laterality: N/A;  . LEFT HEART CATH AND CORONARY ANGIOGRAPHY N/A 10/04/2018   Procedure: LEFT HEART CATH AND CORONARY ANGIOGRAPHY;  Surgeon: Jettie Booze, MD;  Location: [place] CV LAB;  Service: Cardiovascular;  Laterality: N/A;  . PARTIAL COLECTOMY N/A 06/30/2019   Procedure: PARTIAL  COLECTOMY;  Surgeon: Ralene Ok, MD;  Location: [place];  Service: General;  Laterality: N/A;  . RIGHT/LEFT HEART CATH AND CORONARY ANGIOGRAPHY N/A 06/12/2017   Procedure: RIGHT/LEFT HEART CATH AND CORONARY ANGIOGRAPHY;  Surgeon: Jolaine Artist, MD;  Location: [place] CV LAB;  Service: Cardiovascular;  Laterality: N/A;    MEDICATIONS: No current facility-administered medications for this encounter.   Marland Kitchen acetaminophen (TYLENOL) 500 MG tablet  . albuterol (PROVENTIL) (2.5 MG/3ML) 0.083% nebulizer solution  . albuterol (VENTOLIN HFA) 108 (90 Base) MCG/ACT inhaler  . amiodarone (PACERONE) 100 MG tablet  . budesonide-formoterol (SYMBICORT) 160-4.5 MCG/ACT inhaler  . carvedilol (COREG) 3.125 MG tablet  . fluticasone (FLONASE) 50 MCG/ACT nasal spray  . furosemide (LASIX) 20 MG tablet  . isosorbide-hydrALAZINE (BIDIL) 20-37.5 MG tablet  . loratadine (CLARITIN) 10 MG tablet  . losartan (COZAAR) 25 MG tablet  . montelukast (SINGULAIR) 10 MG tablet  . polyethylene glycol powder (GLYCOLAX/MIRALAX) powder  . spironolactone (ALDACTONE) 25 MG tablet    Myra Gianotti, PA-C Surgical Short Stay/Anesthesiology [facility] Phone [phone] [facility] Phone [phone] 08/01/2019 11:18 AM

## 2019-08-02 ENCOUNTER — Ambulatory Visit (HOSPITAL_COMMUNITY): Payer: Medicare Other | Admitting: Vascular Surgery

## 2019-08-02 ENCOUNTER — Ambulatory Visit (HOSPITAL_COMMUNITY): Payer: Medicare Other

## 2019-08-02 ENCOUNTER — Encounter (HOSPITAL_COMMUNITY)
Admission: RE | Disposition: A | Discharge status: Home / Self Care | Payer: Self-pay | Source: Home / Self Care | Attending: General Surgery

## 2019-08-02 ENCOUNTER — Ambulatory Visit (HOSPITAL_COMMUNITY)
Admission: RE | Admit: 2019-08-02 | Discharge: 2019-08-02 | Disposition: A | Discharge status: Home / Self Care | Payer: Medicare Other | Attending: General Surgery | Admitting: General Surgery

## 2019-08-02 ENCOUNTER — Encounter (HOSPITAL_COMMUNITY): Payer: Self-pay | Admitting: General Surgery

## 2019-08-02 DIAGNOSIS — I130 Hypertensive heart and chronic kidney disease with heart failure and stage 1 through stage 4 chronic kidney disease, or unspecified chronic kidney disease: Secondary | ICD-10-CM | POA: Insufficient documentation

## 2019-08-02 DIAGNOSIS — Z85038 Personal history of other malignant neoplasm of large intestine: Secondary | ICD-10-CM | POA: Diagnosis not present

## 2019-08-02 DIAGNOSIS — J45909 Unspecified asthma, uncomplicated: Secondary | ICD-10-CM | POA: Diagnosis not present

## 2019-08-02 DIAGNOSIS — Z87891 Personal history of nicotine dependence: Secondary | ICD-10-CM | POA: Diagnosis not present

## 2019-08-02 DIAGNOSIS — Z419 Encounter for procedure for purposes other than remedying health state, unspecified: Secondary | ICD-10-CM

## 2019-08-02 DIAGNOSIS — Z7951 Long term (current) use of inhaled steroids: Secondary | ICD-10-CM | POA: Insufficient documentation

## 2019-08-02 DIAGNOSIS — E785 Hyperlipidemia, unspecified: Secondary | ICD-10-CM | POA: Diagnosis not present

## 2019-08-02 DIAGNOSIS — N189 Chronic kidney disease, unspecified: Secondary | ICD-10-CM | POA: Insufficient documentation

## 2019-08-02 DIAGNOSIS — C189 Malignant neoplasm of colon, unspecified: Secondary | ICD-10-CM | POA: Insufficient documentation

## 2019-08-02 DIAGNOSIS — K219 Gastro-esophageal reflux disease without esophagitis: Secondary | ICD-10-CM | POA: Insufficient documentation

## 2019-08-02 DIAGNOSIS — I509 Heart failure, unspecified: Secondary | ICD-10-CM | POA: Insufficient documentation

## 2019-08-02 DIAGNOSIS — Z95828 Presence of other vascular implants and grafts: Secondary | ICD-10-CM

## 2019-08-02 DIAGNOSIS — Z888 Allergy status to other drugs, medicaments and biological substances status: Secondary | ICD-10-CM | POA: Insufficient documentation

## 2019-08-02 DIAGNOSIS — Z8249 Family history of ischemic heart disease and other diseases of the circulatory system: Secondary | ICD-10-CM | POA: Diagnosis not present

## 2019-08-02 DIAGNOSIS — Z79899 Other long term (current) drug therapy: Secondary | ICD-10-CM | POA: Insufficient documentation

## 2019-08-02 DIAGNOSIS — M1990 Unspecified osteoarthritis, unspecified site: Secondary | ICD-10-CM | POA: Diagnosis not present

## 2019-08-02 HISTORY — PX: PORTACATH PLACEMENT: SHX2246

## 2019-08-02 HISTORY — DX: Chronic kidney disease, unspecified: N18.9

## 2019-08-02 HISTORY — DX: Malignant (primary) neoplasm, unspecified: C80.1

## 2019-08-02 HISTORY — DX: Gastro-esophageal reflux disease without esophagitis: K21.9

## 2019-08-02 HISTORY — DX: Cardiac arrhythmia, unspecified: I49.9

## 2019-08-02 HISTORY — DX: Dyspnea, unspecified: R06.00

## 2019-08-02 HISTORY — DX: Pneumonia, unspecified organism: J18.9

## 2019-08-02 SURGERY — INSERTION, TUNNELED CENTRAL VENOUS DEVICE, WITH PORT
Anesthesia: General | Laterality: Left

## 2019-08-02 MED ORDER — FENTANYL CITRATE (PF) 250 MCG/5ML IJ SOLN
INTRAMUSCULAR | Status: AC
  Filled 2019-08-02: qty 5

## 2019-08-02 MED ORDER — LACTATED RINGERS IV SOLN: INTRAVENOUS | Status: DC

## 2019-08-02 MED ORDER — ONDANSETRON HCL 4 MG/2ML IJ SOLN
INTRAMUSCULAR | Status: DC | PRN
  Administered 2019-08-02: 4 mg via INTRAVENOUS

## 2019-08-02 MED ORDER — PROPOFOL 10 MG/ML IV BOLUS
INTRAVENOUS | Status: AC
  Filled 2019-08-02: qty 20

## 2019-08-02 MED ORDER — BUPIVACAINE HCL 0.25 % IJ SOLN
INTRAMUSCULAR | Status: DC | PRN
  Administered 2019-08-02: 5 mL

## 2019-08-02 MED ORDER — PROPOFOL 10 MG/ML IV BOLUS
INTRAVENOUS | Status: DC | PRN
  Administered 2019-08-02: 170 mg via INTRAVENOUS

## 2019-08-02 MED ORDER — HEPARIN SOD (PORK) LOCK FLUSH 100 UNIT/ML IV SOLN
INTRAVENOUS | Status: DC | PRN
  Administered 2019-08-02: 500 [IU]

## 2019-08-02 MED ORDER — FENTANYL CITRATE (PF) 250 MCG/5ML IJ SOLN
INTRAMUSCULAR | Status: DC | PRN
  Administered 2019-08-02 (×2): 50 ug via INTRAVENOUS

## 2019-08-02 MED ORDER — EPHEDRINE SULFATE-NACL 50-0.9 MG/10ML-% IV SOSY
PREFILLED_SYRINGE | INTRAVENOUS | Status: DC | PRN
  Administered 2019-08-02: 10 mg via INTRAVENOUS

## 2019-08-02 MED ORDER — BUPIVACAINE HCL (PF) 0.25 % IJ SOLN
INTRAMUSCULAR | Status: AC
  Filled 2019-08-02: qty 20

## 2019-08-02 MED ORDER — TRAMADOL HCL 50 MG PO TABS: 50.0000 mg | ORAL_TABLET | Freq: Four times a day (QID) | ORAL | 0 refills | Status: DC | PRN

## 2019-08-02 MED ORDER — HEPARIN SODIUM (PORCINE) 1000 UNIT/ML IJ SOLN
INTRAMUSCULAR | Status: AC
  Filled 2019-08-02: qty 1

## 2019-08-02 MED ORDER — PROTAMINE SULFATE 10 MG/ML IV SOLN
INTRAVENOUS | Status: AC
  Filled 2019-08-02: qty 5

## 2019-08-02 MED ORDER — MIDAZOLAM HCL 2 MG/2ML IJ SOLN
INTRAMUSCULAR | Status: AC
  Filled 2019-08-02: qty 2

## 2019-08-02 MED ORDER — MIDAZOLAM HCL 2 MG/2ML IJ SOLN
INTRAMUSCULAR | Status: DC | PRN
  Administered 2019-08-02: 2 mg via INTRAVENOUS

## 2019-08-02 MED ORDER — LIDOCAINE 2% (20 MG/ML) 5 ML SYRINGE
INTRAMUSCULAR | Status: DC | PRN
  Administered 2019-08-02: 60 mg via INTRAVENOUS

## 2019-08-02 MED ORDER — ACETAMINOPHEN 500 MG PO TABS: 1000.0000 mg | ORAL_TABLET | ORAL | Status: AC

## 2019-08-02 MED ORDER — HEPARIN SOD (PORK) LOCK FLUSH 100 UNIT/ML IV SOLN
INTRAVENOUS | Status: AC
  Filled 2019-08-02: qty 5

## 2019-08-02 MED ORDER — SODIUM CHLORIDE 0.9 % IV SOLN
INTRAVENOUS | Status: DC | PRN
  Administered 2019-08-02: 500 mL

## 2019-08-02 MED ORDER — LIDOCAINE 2% (20 MG/ML) 5 ML SYRINGE
INTRAMUSCULAR | Status: AC
  Filled 2019-08-02: qty 5

## 2019-08-02 MED ORDER — CEFAZOLIN SODIUM-DEXTROSE 2-4 GM/100ML-% IV SOLN
2.0000 g | INTRAVENOUS | Status: AC
  Administered 2019-08-02: 2 g via INTRAVENOUS
  Filled 2019-08-02: qty 100

## 2019-08-02 MED ORDER — CHLORHEXIDINE GLUCONATE CLOTH 2 % EX PADS: 6.0000 | MEDICATED_PAD | Freq: Once | CUTANEOUS | Status: DC

## 2019-08-02 MED ORDER — ACETAMINOPHEN 500 MG PO TABS
ORAL_TABLET | ORAL | Status: AC
  Administered 2019-08-02: 1000 mg via ORAL
  Filled 2019-08-02: qty 2

## 2019-08-02 MED ORDER — EPHEDRINE 5 MG/ML INJ
INTRAVENOUS | Status: AC
  Filled 2019-08-02: qty 10

## 2019-08-02 MED ORDER — SODIUM CHLORIDE 0.9 % IV SOLN
INTRAVENOUS | Status: AC
  Filled 2019-08-02: qty 1.2

## 2019-08-02 MED ORDER — ONDANSETRON HCL 4 MG/2ML IJ SOLN
INTRAMUSCULAR | Status: AC
  Filled 2019-08-02: qty 2

## 2019-08-02 SURGICAL SUPPLY — 50 items
ADH SKN CLS APL DERMABOND .7 (GAUZE/BANDAGES/DRESSINGS) ×1
BAG DECANTER FOR FLEXI CONT (MISCELLANEOUS) ×3 IMPLANT
BLADE CLIPPER SURG (BLADE) IMPLANT
CHLORAPREP W/TINT 10.5 ML (MISCELLANEOUS) ×3 IMPLANT
COVER MAYO STAND STRL (DRAPES) ×2 IMPLANT
COVER PROBE W GEL 5X96 (DRAPES) ×2 IMPLANT
COVER SURGICAL LIGHT HANDLE (MISCELLANEOUS) ×1 IMPLANT
COVER TRANSDUCER ULTRASND GEL (DRAPE) ×2 IMPLANT
COVER WAND RF STERILE (DRAPES) ×1 IMPLANT
DECANTER SPIKE VIAL GLASS SM (MISCELLANEOUS) ×1 IMPLANT
DERMABOND ADVANCED (GAUZE/BANDAGES/DRESSINGS) ×2
DERMABOND ADVANCED .7 DNX12 (GAUZE/BANDAGES/DRESSINGS) ×1 IMPLANT
DRAPE C-ARM 42X120 X-RAY (DRAPES) ×3 IMPLANT
DRAPE CHEST BREAST 15X10 FENES (DRAPES) ×3 IMPLANT
DRSG COVADERM 4X6 (GAUZE/BANDAGES/DRESSINGS) ×1 IMPLANT
DRSG TEGADERM 4X4.75 (GAUZE/BANDAGES/DRESSINGS) ×2 IMPLANT
ELECT CAUTERY BLADE 6.4 (BLADE) ×3 IMPLANT
ELECT REM PT RETURN 9FT ADLT (ELECTROSURGICAL) ×3
ELECTRODE REM PT RTRN 9FT ADLT (ELECTROSURGICAL) ×1 IMPLANT
GAUZE 4X4 16PLY RFD (DISPOSABLE) ×1 IMPLANT
GAUZE SPONGE 4X4 12PLY STRL LF (GAUZE/BANDAGES/DRESSINGS) ×2 IMPLANT
GEL ULTRASOUND 20GR AQUASONIC (MISCELLANEOUS) IMPLANT
GLOVE BIO SURGEON STRL SZ7.5 (GLOVE) ×3 IMPLANT
GLOVE BIOGEL PI IND STRL 8 (GLOVE) ×1 IMPLANT
GLOVE BIOGEL PI INDICATOR 8 (GLOVE) ×2
GOWN STRL REUS W/ TWL LRG LVL3 (GOWN DISPOSABLE) ×2 IMPLANT
GOWN STRL REUS W/ TWL XL LVL3 (GOWN DISPOSABLE) ×1 IMPLANT
GOWN STRL REUS W/TWL LRG LVL3 (GOWN DISPOSABLE) ×6
GOWN STRL REUS W/TWL XL LVL3 (GOWN DISPOSABLE) ×3
INTRODUCER 13FR (INTRODUCER) IMPLANT
INTRODUCER COOK 11FR (CATHETERS) IMPLANT
KIT BASIN OR (CUSTOM PROCEDURE TRAY) ×3 IMPLANT
KIT PORT POWER 8FR ISP CVUE (Port) ×2 IMPLANT
KIT TURNOVER KIT B (KITS) ×3 IMPLANT
NS IRRIG 1000ML POUR BTL (IV SOLUTION) ×3 IMPLANT
PAD ARMBOARD 7.5X6 YLW CONV (MISCELLANEOUS) ×3 IMPLANT
PENCIL BUTTON HOLSTER BLD 10FT (ELECTRODE) ×3 IMPLANT
POSITIONER HEAD DONUT 9IN (MISCELLANEOUS) ×1 IMPLANT
SET INTRODUCER 12FR PACEMAKER (INTRODUCER) IMPLANT
SET SHEATH INTRODUCER 10FR (MISCELLANEOUS) IMPLANT
SHEATH COOK PEEL AWAY SET 9F (SHEATH) IMPLANT
SUT MNCRL AB 3-0 PS2 18 (SUTURE) ×1 IMPLANT
SUT MNCRL AB 4-0 PS2 18 (SUTURE) ×2 IMPLANT
SUT PROLENE 2 0 SH 30 (SUTURE) ×3 IMPLANT
SUT SILK 2 0 SH (SUTURE) ×3 IMPLANT
SUT VIC AB 3-0 SH 27 (SUTURE) ×3
SUT VIC AB 3-0 SH 27X BRD (SUTURE) ×1 IMPLANT
TOWEL GREEN STERILE (TOWEL DISPOSABLE) ×3 IMPLANT
TOWEL GREEN STERILE FF (TOWEL DISPOSABLE) ×3 IMPLANT
TRAY LAPAROSCOPIC MC (CUSTOM PROCEDURE TRAY) ×3 IMPLANT

## 2019-08-02 NOTE — H&P (Signed)
Joe Garcia is an 69 y.o. male.   Chief Complaint: Colon CA HPI: Pt is a 45 M with colon cancer and to start chemo tomorrow. Pt in need for PAC.  Pt is right handed.  Past Medical History:  Diagnosis Date  . Alcoholism ([place])   . Arthritis    hips, L shoulder  . Asthma   . Cancer [facility])    colon cancer  . CHF (congestive heart failure) ([place])   . Chronic kidney disease    only has one kidney  . Depression    pt denies  . Dyspnea   . Dysrhythmia    PVC's  . Family history of anesthesia complication   . GERD (gastroesophageal reflux disease)   . Hyperlipidemia   . Hypertension   . Pneumonia     Past Surgical History:  Procedure Laterality Date  . BIOPSY  06/27/2019   Procedure: BIOPSY;  Surgeon: Yetta Flock, MD;  Location: [facility] ENDOSCOPY;  Service: Gastroenterology;;  . COLONOSCOPY Left 06/27/2019   Procedure: COLONOSCOPY;  Surgeon: Yetta Flock, MD;  Location: [place];  Service: Gastroenterology;  Laterality: Left;  . LAPAROTOMY N/A 06/30/2019   Procedure: EXPLORATORY LAPAROTOMY;  Surgeon: Ralene Ok, MD;  Location: [place];  Service: General;  Laterality: N/A;  . LEFT HEART CATH AND CORONARY ANGIOGRAPHY N/A 10/04/2018   Procedure: LEFT HEART CATH AND CORONARY ANGIOGRAPHY;  Surgeon: Jettie Booze, MD;  Location: [place] CV LAB;  Service: Cardiovascular;  Laterality: N/A;  . PARTIAL COLECTOMY N/A 06/30/2019   Procedure: PARTIAL COLECTOMY;  Surgeon: Ralene Ok, MD;  Location: [place];  Service: General;  Laterality: N/A;  . RIGHT/LEFT HEART CATH AND CORONARY ANGIOGRAPHY N/A 06/12/2017   Procedure: RIGHT/LEFT HEART CATH AND CORONARY ANGIOGRAPHY;  Surgeon: Jolaine Artist, MD;  Location: [place] CV LAB;  Service: Cardiovascular;  Laterality: N/A;    Family History  Problem Relation Age of Onset  . Heart disease Father   . Heart disease Daughter        "fluid around heart"   . [place] disease Daughter   . Cancer Daughter         unsure of type  . Asthma Son   . Heart failure Mother   . Kidney failure Mother    Social History:  reports that he quit smoking about 9 years ago. He has a 4.50 pack-year smoking history. He has never used smokeless tobacco. He reports that he does not drink alcohol or use drugs.  Allergies:  Allergies  Allergen Reactions  . Lisinopril Swelling    Facial and tongue swelling 10/2012    Medications Prior to Admission  Medication Sig Dispense Refill  . acetaminophen (TYLENOL) 500 MG tablet Take 2 tablets (1,000 mg total) by mouth 3 (three) times daily as needed for headache (pain). 30 tablet 0  . albuterol (PROVENTIL) (2.5 MG/3ML) 0.083% nebulizer solution Take 3 mLs (2.5 mg total) by nebulization every 6 (six) hours as needed for wheezing or shortness of breath. 75 mL 0  . albuterol (VENTOLIN HFA) 108 (90 Base) MCG/ACT inhaler Inhale 1-2 puffs into the lungs every 4 (four) hours as needed for wheezing or shortness of breath. 18 g 0  . amiodarone (PACERONE) 100 MG tablet Take 1 tablet (100 mg total) by mouth daily. 30 tablet 6  . budesonide-formoterol (SYMBICORT) 160-4.5 MCG/ACT inhaler Inhale 2 puffs into the lungs 2 (two) times daily. 1 Inhaler 11  . carvedilol (COREG) 3.125 MG tablet Take 1 tablet (3.125  mg total) by mouth 2 (two) times daily with a meal. 60 tablet 5  . fluticasone (FLONASE) 50 MCG/ACT nasal spray Place 2 sprays into both nostrils daily. 16 g 0  . furosemide (LASIX) 20 MG tablet Take 1 tablet (20 mg total) by mouth daily. 30 tablet 5  . isosorbide-hydrALAZINE (BIDIL) 20-37.5 MG tablet Take 0.5 tablets by mouth 3 (three) times daily.     Marland Kitchen loratadine (CLARITIN) 10 MG tablet Take 10 mg by mouth daily.    Marland Kitchen losartan (COZAAR) 25 MG tablet TAKE 1 TABLET BY MOUTH EVERY DAY (Patient taking differently: Take 25 mg by mouth at bedtime. ) 30 tablet 6  . spironolactone (ALDACTONE) 25 MG tablet Take 1 tablet (25 mg total) by mouth daily. 30 tablet 3  . montelukast (SINGULAIR) 10 MG  tablet Take 10 mg by mouth at bedtime.    . polyethylene glycol powder (GLYCOLAX/MIRALAX) powder Take 17 g by mouth daily as needed for mild constipation.      No results found for this or any previous visit (from the past 48 hour(s)). No results found.  Review of Systems  Constitutional: Negative for chills and fever.  HENT: Negative for ear discharge, hearing loss and sore throat.   Eyes: Negative for discharge.  Respiratory: Negative for cough and shortness of breath.   Cardiovascular: Negative for chest pain and leg swelling.  Gastrointestinal: Negative for abdominal pain, constipation, diarrhea, nausea and vomiting.  Musculoskeletal: Negative for myalgias and neck pain.  Skin: Negative for rash.  Allergic/Immunologic: Negative for environmental allergies.  Neurological: Negative for dizziness and seizures.  Hematological: Does not bruise/bleed easily.  Psychiatric/Behavioral: Negative for suicidal ideas.  All other systems reviewed and are negative.   Blood pressure (!) 156/73, pulse (!) 58, temperature 98.5 F (36.9 C), resp. rate 18, height 5\' 11"  (1.803 m), weight 62.1 kg, SpO2 100 %. Physical Exam  Constitutional: He is oriented to person, place, and time. Vital signs are normal. He appears well-developed and well-nourished.  Conversant No acute distress  Eyes: Lids are normal. No scleral icterus.  No lid lag Moist conjunctiva  Neck: No tracheal tenderness present. No thyromegaly present.  No cervical lymphadenopathy  Cardiovascular: Normal rate, regular rhythm and intact distal pulses.  No murmur heard. Respiratory: Effort normal and breath sounds normal. He has no wheezes. He has no rales.  GI: There is no hepatosplenomegaly. There is no abdominal tenderness. No hernia.  Neurological: He is alert and oriented to person, place, and time.  Normal gait and station  Skin: Skin is warm. No rash noted. No cyanosis. Nails show no clubbing.  Normal skin turgor   Psychiatric: Judgment normal.  Appropriate affect     Assessment/Plan 24M with colon CA - to OR for PAC placemetn -.I d/w him risks and benefits of the prcedure to include but not limited to: infection, bleeding, damage to surrounding structures, possible pneumothorax, possible further procedures needed.  Pt voiced understanding and wishes to proceed.  Ralene Ok, MD 08/02/2019, 3:47 PM

## 2019-08-02 NOTE — Discharge Instructions (Signed)

## 2019-08-02 NOTE — Progress Notes (Signed)
Patient states that he will be picked up by Luellen Pucker (son) and that Durward Parcel (son) will stay with him for 24 hours.

## 2019-08-02 NOTE — Progress Notes (Signed)
Dr. Fransisco Beau aware that patient ate a cookie and a Boost at 0100 morning of surgery.

## 2019-08-02 NOTE — Anesthesia Procedure Notes (Signed)
Procedure Name: LMA Insertion Date/Time: 08/02/2019 3:55 PM Performed by: Janace Litten, CRNA Pre-anesthesia Checklist: Patient identified, Emergency Drugs available, Suction available and Patient being monitored Patient Re-evaluated:Patient Re-evaluated prior to induction Oxygen Delivery Method: Circle System Utilized Preoxygenation: Pre-oxygenation with 100% oxygen Induction Type: IV induction Ventilation: Mask ventilation without difficulty LMA: LMA inserted LMA Size: 4.0 Number of attempts: 1 Placement Confirmation: positive ETCO2 Tube secured with: Tape Dental Injury: Teeth and Oropharynx as per pre-operative assessment

## 2019-08-02 NOTE — Anesthesia Postprocedure Evaluation (Signed)
Anesthesia Post Note  Patient: Joe Garcia  Procedure(s) Performed: INSERTION PORT-A-CATH LEFT CHEST (Left )     Patient location during evaluation: PACU Anesthesia Type: General Level of consciousness: awake and alert Pain management: pain level controlled Vital Signs Assessment: post-procedure vital signs reviewed and stable Respiratory status: spontaneous breathing, nonlabored ventilation, respiratory function stable and patient connected to nasal cannula oxygen Cardiovascular status: blood pressure returned to baseline and stable Postop Assessment: no apparent nausea or vomiting Anesthetic complications: no    Last Vitals:  Vitals:   08/02/19 1722 08/02/19 1730  BP: (!) 143/68   Pulse: (!) 47   Resp: 13   Temp:  (!) 36.4 C  SpO2: 98%     Last Pain:  Vitals:   08/02/19 1730  PainSc: 0-No pain                 Tiajuana Amass

## 2019-08-02 NOTE — Op Note (Signed)
08/02/2019  4:23 PM  PATIENT:  Joe Garcia  69 y.o. male  PRE-OPERATIVE DIAGNOSIS:  COLON CANCER  POST-OPERATIVE DIAGNOSIS:  COLON CANCER  PROCEDURE:  Procedure(s): INSERTION PORT-A-CATH LEFT CHEST (Left)  SURGEON:  Surgeon(s) and Role:    Ralene Ok, MD - Primary  ANESTHESIA:   local and general  EBL:  5 mL   BLOOD ADMINISTERED:none  DRAINS: none   LOCAL MEDICATIONS USED:  BUPIVICAINE   SPECIMEN:  No Specimen  DISPOSITION OF SPECIMEN:  N/A  COUNTS:  YES  TOURNIQUET:  * No tourniquets in log *  DICTATION: .Dragon Dictation Counts: reported as correct x 2  Findings:  Patient 8 French MRI-compatible power port placed in her left infraclavicular area. The tip lay at the junction of the SVC and atrial junction.  The port aspirated and flushed easily.  Indications for procedure:  [name] and scheduled to undergo chemo tx starting tomorrow.  Details of the procedure:The patient was taken back to the operating room. The patient was placed in supine position with bilateral SCDs in place.  The patient was prepped and draped in the usual sterile fashion. After appropriate anitbiotics were confirmed, a time-out was confirmed and all facts were verified.  A 3 cm incision was made in the left deltopectoral groove. Bovie cautery was used to maintain hemostasis and dissection was taken down just superficial to the pectoralis fashion. Blunt dissection was used to create a pocket. The port was placed in this area check for a good fit. At this time a Seldinger technique was used to cannulize the subclavian vein. This was confirmed with fluoroscopy. At this time the introducer was then placed over the wire, the wire removed. In the catheter was then placed into the introducer.  Under direct fluoroscopy the catheter was repositioned to be at [facility] /atrial junction. This was approximately 21 cm. At this time the port was connected to the catheter and  secured. The port was sutured into the previously made pocket with 2-0 Prolene x3. 2-0 silk was then used to enter the junction of the catheter and port. A final x-ray was then shot to confirm the placement of the tip of the catheter as well as the curve the catheter to the port. At this time the [facility] needle was used to aspirate from the port and flushed easily.At this time the skin was reapproximated using a 3-0 Monocryl subcuticular fashion. The skin was Dermabond. The port was left accessed.The patient was awakened from anesthesia and taken to recovery in stable condition.      PLAN OF CARE: Discharge to home after PACU  PATIENT DISPOSITION:  PACU - hemodynamically stable.   Delay start of Pharmacological VTE agent (>24hrs) due to surgical blood loss or risk of bleeding: not applicable

## 2019-08-02 NOTE — Transfer of Care (Signed)
Immediate Anesthesia Transfer of Care Note  Patient: Joe Garcia  Procedure(s) Performed: INSERTION PORT-A-CATH LEFT CHEST (Left )  Patient Location: PACU  Anesthesia Type:General  Level of Consciousness: drowsy and patient cooperative  Airway & Oxygen Therapy: Patient Spontanous Breathing  Post-op Assessment: Report given to RN and Post -op Vital signs reviewed and stable  Post vital signs: Reviewed and stable  Last Vitals:  Vitals Value Taken Time  BP 143/80 08/02/19 1637  Temp    Pulse 53 08/02/19 1638  Resp 16 08/02/19 1638  SpO2 100 % 08/02/19 1638  Vitals shown include unvalidated device data.  Last Pain:  Vitals:   08/02/19 1146  PainSc: 0-No pain         Complications: No apparent anesthesia complications

## 2019-08-03 ENCOUNTER — Ambulatory Visit: Admit: 2019-08-03 | Payer: Medicare Other | Admitting: General Surgery

## 2019-08-03 ENCOUNTER — Encounter: Payer: Self-pay | Admitting: Oncology

## 2019-08-03 LAB — SURGICAL PATHOLOGY

## 2019-08-03 SURGERY — INSERTION, TUNNELED CENTRAL VENOUS DEVICE, WITH PORT
Anesthesia: General

## 2019-08-03 NOTE — Progress Notes (Signed)
Called pt to introduce myself as his Arboriculturist.  Pt has 2 insurances so copay assistance shouldn't be needed.  I offered the [employer] and went over what it covers but pt declined it at this time stating that he lives in a group home and can't use it for anything but he does need assistance w/ transportation so I emailed Drucie Ip requesting she reach out to the pt to assist w/ this need.

## 2019-08-04 ENCOUNTER — Encounter: Payer: Self-pay | Admitting: *Deleted

## 2019-08-04 ENCOUNTER — Inpatient Hospital Stay: Payer: Medicare Other | Admitting: Nutrition

## 2019-08-04 ENCOUNTER — Telehealth: Payer: Self-pay | Admitting: Nurse Practitioner

## 2019-08-04 ENCOUNTER — Ambulatory Visit (HOSPITAL_BASED_OUTPATIENT_CLINIC_OR_DEPARTMENT_OTHER): Payer: Medicare Other | Admitting: Medical

## 2019-08-04 ENCOUNTER — Other Ambulatory Visit: Payer: Self-pay

## 2019-08-04 ENCOUNTER — Inpatient Hospital Stay: Payer: Medicare Other

## 2019-08-04 ENCOUNTER — Inpatient Hospital Stay: Payer: Medicare Other | Attending: Nurse Practitioner | Admitting: Nurse Practitioner

## 2019-08-04 VITALS — BP 171/96 | HR 79 | Temp 98.2°F | Resp 18

## 2019-08-04 DIAGNOSIS — J45909 Unspecified asthma, uncomplicated: Secondary | ICD-10-CM | POA: Insufficient documentation

## 2019-08-04 DIAGNOSIS — Z79899 Other long term (current) drug therapy: Secondary | ICD-10-CM | POA: Insufficient documentation

## 2019-08-04 DIAGNOSIS — C184 Malignant neoplasm of transverse colon: Secondary | ICD-10-CM | POA: Insufficient documentation

## 2019-08-04 DIAGNOSIS — N261 Atrophy of kidney (terminal): Secondary | ICD-10-CM | POA: Diagnosis not present

## 2019-08-04 DIAGNOSIS — R110 Nausea: Secondary | ICD-10-CM | POA: Insufficient documentation

## 2019-08-04 DIAGNOSIS — Z5111 Encounter for antineoplastic chemotherapy: Secondary | ICD-10-CM | POA: Diagnosis not present

## 2019-08-04 DIAGNOSIS — Z95828 Presence of other vascular implants and grafts: Secondary | ICD-10-CM | POA: Diagnosis not present

## 2019-08-04 DIAGNOSIS — D649 Anemia, unspecified: Secondary | ICD-10-CM | POA: Diagnosis not present

## 2019-08-04 DIAGNOSIS — I5022 Chronic systolic (congestive) heart failure: Secondary | ICD-10-CM | POA: Diagnosis not present

## 2019-08-04 LAB — CMP (CANCER CENTER ONLY)
ALT: 13 U/L (ref 0–44)
AST: 12 U/L — ABNORMAL LOW (ref 15–41)
Albumin: 3.9 g/dL (ref 3.5–5.0)
Alkaline Phosphatase: 83 U/L (ref 38–126)
Anion gap: 9 (ref 5–15)
BUN: 18 mg/dL (ref 8–23)
CO2: 26 mmol/L (ref 22–32)
Calcium: 8.8 mg/dL — ABNORMAL LOW (ref 8.9–10.3)
Chloride: 103 mmol/L (ref 98–111)
Creatinine: 1.7 mg/dL — ABNORMAL HIGH (ref 0.61–1.24)
GFR, Est AFR Am: 48 mL/min — ABNORMAL LOW (ref >60)
GFR, Estimated: 41 mL/min — ABNORMAL LOW (ref >60)
Glucose, Bld: 109 mg/dL — ABNORMAL HIGH (ref 70–99)
Potassium: 4.4 mmol/L (ref 3.5–5.1)
Sodium: 138 mmol/L (ref 135–145)
Total Bilirubin: 0.2 mg/dL — ABNORMAL LOW (ref 0.3–1.2)
Total Protein: 7 g/dL (ref 6.5–8.1)

## 2019-08-04 LAB — CBC WITH DIFFERENTIAL (CANCER CENTER ONLY)
Abs Immature Granulocytes: 0.02 10*3/uL (ref 0.00–0.07)
Basophils Absolute: 0 10*3/uL (ref 0.0–0.1)
Basophils Relative: 0 %
Eosinophils Absolute: 0.2 10*3/uL (ref 0.0–0.5)
Eosinophils Relative: 3 %
HCT: 39.3 % (ref 39.0–52.0)
Hemoglobin: 12.5 g/dL — ABNORMAL LOW (ref 13.0–17.0)
Immature Granulocytes: 0 %
Lymphocytes Relative: 32 %
Lymphs Abs: 1.9 10*3/uL (ref 0.7–4.0)
MCH: 26 pg (ref 26.0–34.0)
MCHC: 31.8 g/dL (ref 30.0–36.0)
MCV: 81.9 fL (ref 80.0–100.0)
Monocytes Absolute: 0.4 10*3/uL (ref 0.1–1.0)
Monocytes Relative: 6 %
Neutro Abs: 3.5 10*3/uL (ref 1.7–7.7)
Neutrophils Relative %: 59 %
Platelet Count: 268 10*3/uL (ref 150–400)
RBC: 4.8 MIL/uL (ref 4.22–5.81)
RDW: 20.7 % — ABNORMAL HIGH (ref 11.5–15.5)
WBC Count: 5.9 10*3/uL (ref 4.0–10.5)
nRBC: 0 % (ref 0.0–0.2)

## 2019-08-04 MED ORDER — DEXTROSE 5 % IV SOLN
Freq: Once | INTRAVENOUS | Status: AC
  Filled 2019-08-04: qty 250

## 2019-08-04 MED ORDER — SODIUM CHLORIDE 0.9% FLUSH
10.0000 mL | Freq: Once | INTRAVENOUS | Status: AC
  Administered 2019-08-04: 10 mL
  Filled 2019-08-04: qty 10

## 2019-08-04 MED ORDER — OXALIPLATIN CHEMO INJECTION 100 MG/20ML
65.0000 mg/m2 | Freq: Once | INTRAVENOUS | Status: AC
  Administered 2019-08-04: 120 mg via INTRAVENOUS
  Filled 2019-08-04: qty 10

## 2019-08-04 MED ORDER — LEUCOVORIN CALCIUM INJECTION 350 MG
400.0000 mg/m2 | Freq: Once | INTRAVENOUS | Status: AC
  Administered 2019-08-04: 732 mg via INTRAVENOUS
  Filled 2019-08-04: qty 36.6

## 2019-08-04 MED ORDER — DEXAMETHASONE SODIUM PHOSPHATE 10 MG/ML IJ SOLN
INTRAMUSCULAR | Status: AC
  Filled 2019-08-04: qty 1

## 2019-08-04 MED ORDER — DEXAMETHASONE SODIUM PHOSPHATE 10 MG/ML IJ SOLN
10.0000 mg | Freq: Once | INTRAMUSCULAR | Status: AC
  Administered 2019-08-04: 10 mg via INTRAVENOUS

## 2019-08-04 MED ORDER — HEPARIN SOD (PORK) LOCK FLUSH 100 UNIT/ML IV SOLN
500.0000 [IU] | Freq: Once | INTRAVENOUS | Status: DC | PRN
  Filled 2019-08-04: qty 5

## 2019-08-04 MED ORDER — PROCHLORPERAZINE MALEATE 10 MG PO TABS: 10.0000 mg | ORAL_TABLET | Freq: Four times a day (QID) | ORAL | 3 refills | Status: DC | PRN

## 2019-08-04 MED ORDER — PALONOSETRON HCL INJECTION 0.25 MG/5ML
INTRAVENOUS | Status: AC
  Filled 2019-08-04: qty 5

## 2019-08-04 MED ORDER — PALONOSETRON HCL INJECTION 0.25 MG/5ML
0.2500 mg | Freq: Once | INTRAVENOUS | Status: AC
  Administered 2019-08-04: 0.25 mg via INTRAVENOUS

## 2019-08-04 MED ORDER — LIDOCAINE-PRILOCAINE 2.5-2.5 % EX CREA: TOPICAL_CREAM | CUTANEOUS | 3 refills | Status: DC

## 2019-08-04 MED ORDER — SODIUM CHLORIDE 0.9% FLUSH
10.0000 mL | INTRAVENOUS | Status: DC | PRN
  Filled 2019-08-04: qty 10

## 2019-08-04 MED ORDER — SODIUM CHLORIDE 0.9 % IV SOLN
2400.0000 mg/m2 | INTRAVENOUS | Status: DC
  Administered 2019-08-04: 4400 mg via INTRAVENOUS
  Filled 2019-08-04: qty 88

## 2019-08-04 MED ORDER — FLUOROURACIL CHEMO INJECTION 2.5 GM/50ML
400.0000 mg/m2 | Freq: Once | INTRAVENOUS | Status: AC
  Administered 2019-08-04: 750 mg via INTRAVENOUS
  Filled 2019-08-04: qty 15

## 2019-08-04 NOTE — Progress Notes (Signed)
1st treatment today. Per Dr. [name], he does not need labs for this treatment. May treat based on labs of 07/27/19.

## 2019-08-04 NOTE — Progress Notes (Addendum)
Joe Garcia OFFICE PROGRESS NOTE   Diagnosis: Colon cancer  INTERVAL HISTORY:   Joe Garcia returns as scheduled.  He has had a few episodes of nausea.  Bowels are moving.  No significant diarrhea.  Appetite is poor.  Some pain associated with the Port-A-Cath device.  Objective:  Vital signs in last 24 hours:  Blood pressure (!) 171/96, pulse 79, temperature 98.2 F (36.8 C), temperature source Temporal, resp. rate 18, SpO2 100 %.    HEENT: No thrush or ulcers. GI: Abdomen soft and nontender.  Healed midline surgical incision. Vascular: No leg edema.  Skin: Palms without erythema. Port-A-Cath without erythema.   Lab Results:  Lab Results  Component Value Date   WBC 4.7 07/26/2019   HGB 12.2 (L) 07/26/2019   HCT 37.9 (L) 07/26/2019   MCV 82.0 07/26/2019   PLT 312 07/26/2019   NEUTROABS 2.4 07/26/2019    Imaging:  DG CHEST PORT 1 VIEW  Result Date: 08/02/2019 CLINICAL DATA:  Port-A-Cath placement. EXAM: PORTABLE CHEST 1 VIEW COMPARISON:  CT chest 06/28/2019.  Chest x-ray 06/26/2019. FINDINGS: Port-A-Cath noted with tip over SVC. No pneumothorax. Heart size normal. Mild left base subsegmental atelectasis. No focal alveolar infiltrates. No pleural effusion or pneumothorax. Degenerative change thoracic spine. IMPRESSION: 1. Port-A-Cath noted with tip over SVC. No evidence of pneumothorax. 2.  Mild left base subsegmental atelectasis. Electronically Signed   By: Marcello Moores  Register   On: 08/02/2019 16:52   DG Fluoro Guide CV Line-No Report  Result Date: 08/02/2019 Fluoroscopy was utilized by the requesting physician.  No radiographic interpretation.    Medications: I have reviewed the patient's current medications.  Assessment/Plan: 1. Colon cancer, transverse colon, stage IIIc, T4bN2, status post a partial transverse colectomy 06/30/2019 ? Grade 3, lymphovascular and perineural invasion present, 4/18 lymph nodes positive, positive lymph nodes mesenteric  margin, tumor invades into adherent omentum, MSI high, loss of MLH1 and PMS2 expression ? CT abdomen/pelvis 06/26/2019-thickening within the distal transverse colon, severely atrophic and calcified right kidney ? CT chest 06/28/2019-no evidence of metastatic disease, stable 3 mm right lower lobe nodule ? Cycle 1 FOLFOX 08/04/2019 (oxaliplatin 65 mg/m due to renal function) 2. Asthma 3. 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 Microcytic anemia, likely iron deficiency anemia secondary to #1 7. Port-A-Cath placement on 12/15/2019, Dr. Rosendo Gros  Disposition: Joe Garcia appears stable.  He is scheduled to begin FOLFOX chemotherapy today.  We again reviewed potential toxicities.  He agrees to proceed.  Plan for cycle 1 FOLFOX today as scheduled.  Dr. [name] again reviewed the pathology report with him at today's visit.  There was a positive lymph node margin.  Plan for repeat CTs after 5 cycles of chemotherapy.  We reviewed the labs from 07/26/2019.  Creatinine was elevated.  Creatinine today also elevated.  The CBC from today is adequate for treatment.  Oxaliplatin will be reduced to 65 mg/m due to kidney function.  He will return for lab, follow-up, cycle 2 FOLFOX in 2 weeks.  He will contact the office in the interim with any problems.  Patient seen with Dr. [name].  25 minutes were spent face-to-face at today's visit with the majority of that time involved in counseling/coordination of care.    [name] ANP/GNP-BC   08/04/2019  11:14 AM  This was a shared visit with [name].  His case was presented at the GI tumor conference last week.  The mesenteric tumor margin is felt to  likely be related to a transected lymph node in the transverse mesentery.  We discussed this finding with Joe Garcia.  He may have remaining disease. Treatment FOLFOX and schedule a restaging CT after 5 cycles.  He will be a candidate for immunotherapy if he develops  disease progression.  He has intermittent nausea of unclear etiology prior to beginning chemotherapy.  Julieanne Manson, MD

## 2019-08-04 NOTE — Progress Notes (Signed)
Spoke w/ Lattie Haw today, patient's serum creatinine today is 1.7. Oxaliplatin dose will be decreased to 65 mg/m2 for today and future cycles.  Demetrius Charity, PharmD, [facility] Phone: [phone] 08/04/2019

## 2019-08-04 NOTE — Progress Notes (Signed)
69 year old male diagnosed with colon cancer.  He is a patient of Dr. [name].  Past medical history includes hypertension, hyperlipidemia, GERD, depression, chronic kidney disease, CHF, and alcoholism.  Medications include Lasix, and MiraLAX.  Labs include glucose 103 on December 29.  Height: 5 feet 11 inches. Weight: 137 pounds on January 5. Usual body weight: 162 pounds September 2020. BMI: 19.11.  Patient is receiving FOLFOX. He endorses weight loss.  He has lost about 15% of his body weight over 4 months. He enjoys Ensure but cannot afford it. Reports his only nutrition impact symptom is a little bit of nausea.  He was written a prescription for nausea medication today. He has no other nutrition concerns. 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  Nutrition diagnosis: Unintended weight loss related to cancer and associated treatments as evidenced by 15% weight loss over 4 months.  Intervention: Patient educated to consume increased calories and protein in small frequent meals and snacks. Recommended Ensure Enlive 3 times daily between meals. Provided 1 complementary case. Provided fact sheets. Encouraged nausea medication as needed. Questions were answered and contact information provided.  Monitoring, evaluation, goals: Patient will tolerate adequate calories and protein to minimize weight loss.  Next visit: Thursday, February 21 during infusion.  **Disclaimer: This note was dictated with voice recognition software. Similar sounding words can inadvertently be transcribed and this note may contain transcription errors which may not have been corrected upon publication of note.**

## 2019-08-04 NOTE — Patient Instructions (Signed)
[facility] Discharge Instructions for Patients Receiving Chemotherapy  Today you received the following chemotherapy agents Oxaliplatin; Leucovorin, Fluorouracil  To help prevent nausea and vomiting after your treatment, we encourage you to take your nausea medication as directed   If you develop nausea and vomiting that is not controlled by your nausea medication, call the clinic.   BELOW ARE SYMPTOMS THAT SHOULD BE REPORTED IMMEDIATELY:  *FEVER GREATER THAN 100.5 F  *CHILLS WITH OR WITHOUT FEVER  NAUSEA AND VOMITING THAT IS NOT CONTROLLED WITH YOUR NAUSEA MEDICATION  *UNUSUAL SHORTNESS OF BREATH  *UNUSUAL BRUISING OR BLEEDING  TENDERNESS IN MOUTH AND THROAT WITH OR WITHOUT PRESENCE OF ULCERS  *URINARY PROBLEMS  *BOWEL PROBLEMS  UNUSUAL RASH Items with * indicate a potential emergency and should be followed up as soon as possible.  Feel free to call the clinic should you have any questions or concerns. The clinic phone number is [phone].  Please show the [place] at check-in to the Emergency Department and triage nurse.  Oxaliplatin Injection What is this medicine? OXALIPLATIN (ox AL i PLA tin) is a chemotherapy drug. It targets fast dividing cells, like cancer cells, and causes these cells to die. This medicine is used to treat cancers of the colon and rectum, and many other cancers. This medicine may be used for other purposes; ask your health care provider or pharmacist if you have questions. COMMON BRAND NAME(S): Eloxatin What should I tell my health care provider before I take this medicine? They need to know if you have any of these conditions:  heart disease  history of irregular heartbeat  liver disease  low blood counts, like white cells, platelets, or red blood cells  lung or breathing disease, like asthma  take medicines that treat or prevent blood clots  tingling of the fingers or toes, or other nerve disorder  an  unusual or allergic reaction to oxaliplatin, other chemotherapy, other medicines, foods, dyes, or preservatives  pregnant or trying to get pregnant  breast-feeding How should I use this medicine? This drug is given as an infusion into a vein. It is administered in a hospital or clinic by a specially trained health care professional. Talk to your pediatrician regarding the use of this medicine in children. Special care may be needed. Overdosage: If you think you have taken too much of this medicine contact a poison control center or emergency room at once. NOTE: This medicine is only for you. Do not share this medicine with others. What if I miss a dose? It is important not to miss a dose. Call your doctor or health care professional if you are unable to keep an appointment. What may interact with this medicine? Do not take this medicine with any of the following medications:  cisapride  dronedarone  pimozide  thioridazine This medicine may also interact with the following medications:  aspirin and aspirin-like medicines  certain medicines that treat or prevent blood clots like warfarin, apixaban, dabigatran, and rivaroxaban  cisplatin  cyclosporine  diuretics  medicines for infection like acyclovir, adefovir, amphotericin B, bacitracin, cidofovir, foscarnet, ganciclovir, gentamicin, pentamidine, vancomycin  NSAIDs, medicines for pain and inflammation, like ibuprofen or naproxen  other medicines that prolong the QT interval (an abnormal heart rhythm)  pamidronate  zoledronic acid This list may not describe all possible interactions. Give your health care provider a list of all the medicines, herbs, non-prescription drugs, or dietary supplements you use. Also tell them if you smoke, drink alcohol,  or use illegal drugs. Some items may interact with your medicine. What should I watch for while using this medicine? Your condition will be monitored carefully while you are  receiving this medicine. You may need blood work done while you are taking this medicine. This medicine may make you feel generally unwell. This is not uncommon as chemotherapy can affect healthy cells as well as cancer cells. Report any side effects. Continue your course of treatment even though you feel ill unless your healthcare professional tells you to stop. This medicine can make you more sensitive to cold. Do not drink cold drinks or use ice. Cover exposed skin before coming in contact with cold temperatures or cold objects. When out in cold weather wear warm clothing and cover your mouth and nose to warm the air that goes into your lungs. Tell your doctor if you get sensitive to the cold. Do not become pregnant while taking this medicine or for 9 months after stopping it. Women should inform their health care professional if they wish to become pregnant or think they might be pregnant. Men should not father a child while taking this medicine and for 6 months after stopping it. There is potential for serious side effects to an unborn child. Talk to your health care professional for more information. Do not breast-feed a child while taking this medicine or for 3 months after stopping it. This medicine has caused ovarian failure in some women. This medicine may make it more difficult to get pregnant. Talk to your health care professional if you are concerned about your fertility. This medicine has caused decreased sperm counts in some men. This may make it more difficult to father a child. Talk to your health care professional if you are concerned about your fertility. This medicine may increase your risk of getting an infection. Call your health care professional for advice if you get a fever, chills, or sore throat, or other symptoms of a cold or flu. Do not treat yourself. Try to avoid being around people who are sick. Avoid taking medicines that contain aspirin, acetaminophen, ibuprofen, naproxen,  or ketoprofen unless instructed by your health care professional. These medicines may hide a fever. Be careful brushing or flossing your teeth or using a toothpick because you may get an infection or bleed more easily. If you have any dental work done, tell your dentist you are receiving this medicine. What side effects may I notice from receiving this medicine? Side effects that you should report to your doctor or health care professional as soon as possible:  allergic reactions like skin rash, itching or hives, swelling of the face, lips, or tongue  breathing problems  cough  low blood counts - this medicine may decrease the number of white blood cells, red blood cells, and platelets. You may be at increased risk for infections and bleeding  nausea, vomiting  pain, redness, or irritation at site where injected  pain, tingling, numbness in the hands or feet  signs and symptoms of bleeding such as bloody or black, tarry stools; red or dark brown urine; spitting up blood or brown material that looks like coffee grounds; red spots on the skin; unusual bruising or bleeding from the eyes, gums, or nose  signs and symptoms of a dangerous change in heartbeat or heart rhythm like chest pain; dizziness; fast, irregular heartbeat; palpitations; feeling faint or lightheaded; falls  signs and symptoms of infection like fever; chills; cough; sore throat; pain or trouble passing urine  signs and symptoms of liver injury like dark yellow or brown urine; general ill feeling or flu-like symptoms; light-colored stools; loss of appetite; nausea; right upper belly pain; unusually weak or tired; yellowing of the eyes or skin  signs and symptoms of low red blood cells or anemia such as unusually weak or tired; feeling faint or lightheaded; falls  signs and symptoms of muscle injury like dark urine; trouble passing urine or change in the amount of urine; unusually weak or tired; muscle pain; back pain Side  effects that usually do not require medical attention (report to your doctor or health care professional if they continue or are bothersome):  changes in taste  diarrhea  gas  hair loss  loss of appetite  mouth sores This list may not describe all possible side effects. Call your doctor for medical advice about side effects. You may report side effects to FDA at 1-800-FDA-1088. Where should I keep my medicine? This drug is given in a hospital or clinic and will not be stored at home. NOTE: This sheet is a summary. It may not cover all possible information. If you have questions about this medicine, talk to your doctor, pharmacist, or health care provider.  2020 Elsevier/Gold Standard (2018-12-01 12:20:35)  Leucovorin injection What is this medicine? LEUCOVORIN (loo koe VOR in) is used to prevent or treat the harmful effects of some medicines. This medicine is used to treat anemia caused by a low amount of folic acid in the body. It is also used with 5-fluorouracil (5-FU) to treat colon cancer. This medicine may be used for other purposes; ask your health care provider or pharmacist if you have questions. What should I tell my health care provider before I take this medicine? They need to know if you have any of these conditions:  anemia from low levels of vitamin B-12 in the blood  an unusual or allergic reaction to leucovorin, folic acid, other medicines, foods, dyes, or preservatives  pregnant or trying to get pregnant  breast-feeding How should I use this medicine? This medicine is for injection into a muscle or into a vein. It is given by a health care professional in a hospital or clinic setting. Talk to your pediatrician regarding the use of this medicine in children. Special care may be needed. Overdosage: If you think you have taken too much of this medicine contact a poison control center or emergency room at once. NOTE: This medicine is only for you. Do not share this  medicine with others. What if I miss a dose? This does not apply. What may interact with this medicine?  capecitabine  fluorouracil  phenobarbital  phenytoin  primidone  trimethoprim-sulfamethoxazole This list may not describe all possible interactions. Give your health care provider a list of all the medicines, herbs, non-prescription drugs, or dietary supplements you use. Also tell them if you smoke, drink alcohol, or use illegal drugs. Some items may interact with your medicine. What should I watch for while using this medicine? Your condition will be monitored carefully while you are receiving this medicine. This medicine may increase the side effects of 5-fluorouracil, 5-FU. Tell your doctor or health care professional if you have diarrhea or mouth sores that do not get better or that get worse. What side effects may I notice from receiving this medicine? Side effects that you should report to your doctor or health care professional as soon as possible:  allergic reactions like skin rash, itching or hives, swelling of the face,  lips, or tongue  breathing problems  fever, infection  mouth sores  unusual bleeding or bruising  unusually weak or tired Side effects that usually do not require medical attention (report to your doctor or health care professional if they continue or are bothersome):  constipation or diarrhea  loss of appetite  nausea, vomiting This list may not describe all possible side effects. Call your doctor for medical advice about side effects. You may report side effects to FDA at 1-800-FDA-1088. Where should I keep my medicine? This drug is given in a hospital or clinic and will not be stored at home. NOTE: This sheet is a summary. It may not cover all possible information. If you have questions about this medicine, talk to your doctor, pharmacist, or health care provider.  2020 Elsevier/Gold Standard (2008-01-18 16:50:29)  Fluorouracil, 5-FU  injection What is this medicine? FLUOROURACIL, 5-FU (flure oh YOOR a sil) is a chemotherapy drug. It slows the growth of cancer cells. This medicine is used to treat many types of cancer like breast cancer, colon or rectal cancer, pancreatic cancer, and stomach cancer. This medicine may be used for other purposes; ask your health care provider or pharmacist if you have questions. COMMON BRAND NAME(S): Adrucil What should I tell my health care provider before I take this medicine? They need to know if you have any of these conditions:  blood disorders  dihydropyrimidine dehydrogenase (DPD) deficiency  infection (especially a virus infection such as chickenpox, cold sores, or herpes)  kidney disease  liver disease  malnourished, poor nutrition  recent or ongoing radiation therapy  an unusual or allergic reaction to fluorouracil, other chemotherapy, other medicines, foods, dyes, or preservatives  pregnant or trying to get pregnant  breast-feeding How should I use this medicine? This drug is given as an infusion or injection into a vein. It is administered in a hospital or clinic by a specially trained health care professional. Talk to your pediatrician regarding the use of this medicine in children. Special care may be needed. Overdosage: If you think you have taken too much of this medicine contact a poison control center or emergency room at once. NOTE: This medicine is only for you. Do not share this medicine with others. What if I miss a dose? It is important not to miss your dose. Call your doctor or health care professional if you are unable to keep an appointment. What may interact with this medicine?  allopurinol  cimetidine  dapsone  digoxin  hydroxyurea  leucovorin  levamisole  medicines for seizures like ethotoin, fosphenytoin, phenytoin  medicines to increase blood counts like filgrastim, pegfilgrastim, sargramostim  medicines that treat or prevent blood  clots like warfarin, enoxaparin, and dalteparin  methotrexate  metronidazole  pyrimethamine  some other chemotherapy drugs like busulfan, cisplatin, estramustine, vinblastine  trimethoprim  trimetrexate  vaccines Talk to your doctor or health care professional before taking any of these medicines:  acetaminophen  aspirin  ibuprofen  ketoprofen  naproxen This list may not describe all possible interactions. Give your health care provider a list of all the medicines, herbs, non-prescription drugs, or dietary supplements you use. Also tell them if you smoke, drink alcohol, or use illegal drugs. Some items may interact with your medicine. What should I watch for while using this medicine? Visit your doctor for checks on your progress. This drug may make you feel generally unwell. This is not uncommon, as chemotherapy can affect healthy cells as well as cancer cells. Report any side  effects. Continue your course of treatment even though you feel ill unless your doctor tells you to stop. In some cases, you may be given additional medicines to help with side effects. Follow all directions for their use. Call your doctor or health care professional for advice if you get a fever, chills or sore throat, or other symptoms of a cold or flu. Do not treat yourself. This drug decreases your body's ability to fight infections. Try to avoid being around people who are sick. This medicine may increase your risk to bruise or bleed. Call your doctor or health care professional if you notice any unusual bleeding. Be careful brushing and flossing your teeth or using a toothpick because you may get an infection or bleed more easily. If you have any dental work done, tell your dentist you are receiving this medicine. Avoid taking products that contain aspirin, acetaminophen, ibuprofen, naproxen, or ketoprofen unless instructed by your doctor. These medicines may hide a fever. Do not become pregnant while  taking this medicine. Women should inform their doctor if they wish to become pregnant or think they might be pregnant. There is a potential for serious side effects to an unborn child. Talk to your health care professional or pharmacist for more information. Do not breast-feed an infant while taking this medicine. Men should inform their doctor if they wish to father a child. This medicine may lower sperm counts. Do not treat diarrhea with over the counter products. Contact your doctor if you have diarrhea that lasts more than 2 days or if it is severe and watery. This medicine can make you more sensitive to the sun. Keep out of the sun. If you cannot avoid being in the sun, wear protective clothing and use sunscreen. Do not use sun lamps or tanning beds/booths. What side effects may I notice from receiving this medicine? Side effects that you should report to your doctor or health care professional as soon as possible:  allergic reactions like skin rash, itching or hives, swelling of the face, lips, or tongue  low blood counts - this medicine may decrease the number of white blood cells, red blood cells and platelets. You may be at increased risk for infections and bleeding.  signs of infection - fever or chills, cough, sore throat, pain or difficulty passing urine  signs of decreased platelets or bleeding - bruising, pinpoint red spots on the skin, black, tarry stools, blood in the urine  signs of decreased red blood cells - unusually weak or tired, fainting spells, lightheadedness  breathing problems  changes in vision  chest pain  mouth sores  nausea and vomiting  pain, swelling, redness at site where injected  pain, tingling, numbness in the hands or feet  redness, swelling, or sores on hands or feet  stomach pain  unusual bleeding Side effects that usually do not require medical attention (report to your doctor or health care professional if they continue or are  bothersome):  changes in finger or toe nails  diarrhea  dry or itchy skin  hair loss  headache  loss of appetite  sensitivity of eyes to the light  stomach upset  unusually teary eyes This list may not describe all possible side effects. Call your doctor for medical advice about side effects. You may report side effects to FDA at 1-800-FDA-1088. Where should I keep my medicine? This drug is given in a hospital or clinic and will not be stored at home. NOTE: This sheet is a summary.  It may not cover all possible information. If you have questions about this medicine, talk to your doctor, pharmacist, or health care provider.  2020 Elsevier/Gold Standard (2007-11-17 13:53:16)

## 2019-08-05 ENCOUNTER — Telehealth: Payer: Self-pay | Admitting: *Deleted

## 2019-08-05 NOTE — Progress Notes (Signed)
The patient was seen in the infusion room after he reported having a burning sensation in his left lower quadrant.  He denied GERD, pain, shortness of breath, or any other issues of concern.  He reported that he thought that it could have simply been due to the heated chair that he was sitting and today.  The patient was reassured.  His vital signs were reviewed.  His blood pressure was noted to be 175/89 which was consistent with his vital signs earlier.  His pulse was noted to be 70 and his oxygen saturation was noted to be 99% on room air.  Sandi Mealy, MHS, PA-C Physician Assistant

## 2019-08-06 ENCOUNTER — Inpatient Hospital Stay: Payer: Medicare Other

## 2019-08-06 ENCOUNTER — Other Ambulatory Visit: Payer: Self-pay

## 2019-08-06 VITALS — BP 160/91 | HR 81 | Temp 98.3°F | Resp 18

## 2019-08-06 DIAGNOSIS — Z5111 Encounter for antineoplastic chemotherapy: Secondary | ICD-10-CM | POA: Diagnosis not present

## 2019-08-06 MED ORDER — HEPARIN SOD (PORK) LOCK FLUSH 100 UNIT/ML IV SOLN
500.0000 [IU] | Freq: Once | INTRAVENOUS | Status: AC | PRN
  Administered 2019-08-06: 500 [IU]
  Filled 2019-08-06: qty 5

## 2019-08-06 MED ORDER — SODIUM CHLORIDE 0.9% FLUSH
10.0000 mL | INTRAVENOUS | Status: DC | PRN
  Administered 2019-08-06: 13:00:00 10 mL
  Filled 2019-08-06: qty 10

## 2019-08-14 ENCOUNTER — Other Ambulatory Visit: Payer: Self-pay | Admitting: Oncology

## 2019-08-16 ENCOUNTER — Telehealth (HOSPITAL_COMMUNITY): Payer: Self-pay

## 2019-08-16 NOTE — Telephone Encounter (Signed)
Called pt to inquire about home visit this week, he did not answer his phone.   Marylouise Stacks, EMT-Paramedic 08/16/19

## 2019-08-18 ENCOUNTER — Telehealth: Payer: Self-pay | Admitting: Oncology

## 2019-08-18 ENCOUNTER — Inpatient Hospital Stay: Payer: Medicare Other

## 2019-08-18 ENCOUNTER — Other Ambulatory Visit: Payer: Self-pay

## 2019-08-18 ENCOUNTER — Encounter: Payer: Self-pay | Admitting: Nurse Practitioner

## 2019-08-18 ENCOUNTER — Inpatient Hospital Stay (HOSPITAL_BASED_OUTPATIENT_CLINIC_OR_DEPARTMENT_OTHER): Payer: Medicare Other | Admitting: Nurse Practitioner

## 2019-08-18 ENCOUNTER — Inpatient Hospital Stay: Payer: Medicare Other | Admitting: Nutrition

## 2019-08-18 VITALS — BP 171/83 | HR 58 | Temp 98.3°F | Resp 16

## 2019-08-18 VITALS — BP 168/87 | HR 61

## 2019-08-18 DIAGNOSIS — Z5111 Encounter for antineoplastic chemotherapy: Secondary | ICD-10-CM | POA: Diagnosis not present

## 2019-08-18 DIAGNOSIS — C184 Malignant neoplasm of transverse colon: Secondary | ICD-10-CM

## 2019-08-18 LAB — CMP (CANCER CENTER ONLY)
ALT: 12 U/L (ref 0–44)
AST: 12 U/L — ABNORMAL LOW (ref 15–41)
Albumin: 3.7 g/dL (ref 3.5–5.0)
Alkaline Phosphatase: 73 U/L (ref 38–126)
Anion gap: 9 (ref 5–15)
BUN: 23 mg/dL (ref 8–23)
CO2: 22 mmol/L (ref 22–32)
Calcium: 8.5 mg/dL — ABNORMAL LOW (ref 8.9–10.3)
Chloride: 109 mmol/L (ref 98–111)
Creatinine: 1.28 mg/dL — ABNORMAL HIGH (ref 0.61–1.24)
GFR, Est AFR Am: >60 mL/min (ref >60)
GFR, Estimated: 58 mL/min — ABNORMAL LOW (ref >60)
Glucose, Bld: 99 mg/dL (ref 70–99)
Potassium: 4 mmol/L (ref 3.5–5.1)
Sodium: 140 mmol/L (ref 135–145)
Total Bilirubin: 0.5 mg/dL (ref 0.3–1.2)
Total Protein: 6.5 g/dL (ref 6.5–8.1)

## 2019-08-18 LAB — CBC WITH DIFFERENTIAL (CANCER CENTER ONLY)
Abs Immature Granulocytes: 0.01 10*3/uL (ref 0.00–0.07)
Basophils Absolute: 0 10*3/uL (ref 0.0–0.1)
Basophils Relative: 1 %
Eosinophils Absolute: 0.2 10*3/uL (ref 0.0–0.5)
Eosinophils Relative: 4 %
HCT: 36.1 % — ABNORMAL LOW (ref 39.0–52.0)
Hemoglobin: 11.9 g/dL — ABNORMAL LOW (ref 13.0–17.0)
Immature Granulocytes: 0 %
Lymphocytes Relative: 43 %
Lymphs Abs: 2 10*3/uL (ref 0.7–4.0)
MCH: 26.6 pg (ref 26.0–34.0)
MCHC: 33 g/dL (ref 30.0–36.0)
MCV: 80.6 fL (ref 80.0–100.0)
Monocytes Absolute: 0.3 10*3/uL (ref 0.1–1.0)
Monocytes Relative: 6 %
Neutro Abs: 2.2 10*3/uL (ref 1.7–7.7)
Neutrophils Relative %: 46 %
Platelet Count: 217 10*3/uL (ref 150–400)
RBC: 4.48 MIL/uL (ref 4.22–5.81)
RDW: 20.5 % — ABNORMAL HIGH (ref 11.5–15.5)
WBC Count: 4.7 10*3/uL (ref 4.0–10.5)
nRBC: 0 % (ref 0.0–0.2)

## 2019-08-18 MED ORDER — FLUOROURACIL CHEMO INJECTION 2.5 GM/50ML
400.0000 mg/m2 | Freq: Once | INTRAVENOUS | Status: AC
  Administered 2019-08-18: 750 mg via INTRAVENOUS
  Filled 2019-08-18: qty 15

## 2019-08-18 MED ORDER — PALONOSETRON HCL INJECTION 0.25 MG/5ML
0.2500 mg | Freq: Once | INTRAVENOUS | Status: AC
  Administered 2019-08-18: 0.25 mg via INTRAVENOUS

## 2019-08-18 MED ORDER — ALBUTEROL SULFATE (2.5 MG/3ML) 0.083% IN NEBU: 2.5000 mg | INHALATION_SOLUTION | Freq: Four times a day (QID) | RESPIRATORY_TRACT | 0 refills | Status: DC | PRN

## 2019-08-18 MED ORDER — OXALIPLATIN CHEMO INJECTION 100 MG/20ML
65.0000 mg/m2 | Freq: Once | INTRAVENOUS | Status: AC
  Administered 2019-08-18: 120 mg via INTRAVENOUS
  Filled 2019-08-18: qty 24

## 2019-08-18 MED ORDER — DEXTROSE 5 % IV SOLN
Freq: Once | INTRAVENOUS | Status: AC
  Filled 2019-08-18: qty 250

## 2019-08-18 MED ORDER — PALONOSETRON HCL INJECTION 0.25 MG/5ML
INTRAVENOUS | Status: AC
  Filled 2019-08-18: qty 5

## 2019-08-18 MED ORDER — LEUCOVORIN CALCIUM INJECTION 350 MG
400.0000 mg/m2 | Freq: Once | INTRAVENOUS | Status: AC
  Administered 2019-08-18: 732 mg via INTRAVENOUS
  Filled 2019-08-18: qty 36.6

## 2019-08-18 MED ORDER — DEXAMETHASONE SODIUM PHOSPHATE 10 MG/ML IJ SOLN
INTRAMUSCULAR | Status: AC
  Filled 2019-08-18: qty 1

## 2019-08-18 MED ORDER — SODIUM CHLORIDE 0.9 % IV SOLN
2400.0000 mg/m2 | INTRAVENOUS | Status: DC
  Administered 2019-08-18: 14:00:00 4400 mg via INTRAVENOUS
  Filled 2019-08-18: qty 88

## 2019-08-18 MED ORDER — DEXAMETHASONE SODIUM PHOSPHATE 10 MG/ML IJ SOLN
10.0000 mg | Freq: Once | INTRAMUSCULAR | Status: AC
  Administered 2019-08-18: 10 mg via INTRAVENOUS

## 2019-08-18 NOTE — Progress Notes (Signed)
Nutrition follow-up completed with patient during infusion for colon cancer. Patient reports nausea has resolved. Last weight documented was January 5.  Patient weighed 137 pounds. Patient denies nutrition impact symptoms.  Nutrition diagnosis: Unintended weight loss cannot be evaluated.  Intervention: Recommended patient continue strategies for increased calories and protein at meals along with Ensure Enlive 3 times daily between meals. I provided second complementary case. Recommended patient continue nausea medication as required.  Monitoring, evaluation, goals: Patient will tolerate adequate calories and protein for weight maintenance.  Next visit: Thursday, February 4 during infusion.  **Disclaimer: This note was dictated with voice recognition software. Similar sounding words can inadvertently be transcribed and this note may contain transcription errors which may not have been corrected upon publication of note.**

## 2019-08-18 NOTE — Telephone Encounter (Signed)
I talk with patient regarding add on

## 2019-08-18 NOTE — Progress Notes (Signed)
  [place] [facility] OFFICE PROGRESS NOTE   Diagnosis: Colon cancer  INTERVAL HISTORY:   Joe Garcia returns as scheduled.  He completed cycle 1 FOLFOX 08/04/2019.  He denies nausea/vomiting.  No mouth sores but he did note that his tongue "burned" for about 3 days.  He was able to eat and drink without difficulty.  No diarrhea.  No issues with cold sensitivity.  He reports a good appetite.  Objective:  Vital signs in last 24 hours:  Blood pressure (!) 171/83, pulse (!) 58, temperature 98.3 F (36.8 C), temperature source Temporal, resp. rate 16, SpO2 100 %.    HEENT: No thrush or ulcers. GI: Abdomen soft and nontender.  No hepatomegaly.  Well-healed surgical incision. Vascular: No leg edema.  Skin: Palms without erythema. Port-A-Cath without erythema.   Lab Results:  Lab Results  Component Value Date   WBC 4.7 08/18/2019   HGB 11.9 (L) 08/18/2019   HCT 36.1 (L) 08/18/2019   MCV 80.6 08/18/2019   PLT 217 08/18/2019   NEUTROABS 2.2 08/18/2019    Imaging:  No results found.  Medications: I have reviewed the patient's current medications.  Assessment/Plan: 1. 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 Grade 3, lymphovascular and perineural invasion present, 4/18 lymph nodes positive, positive lymph nodes mesenteric margin, tumor invades into adherent omentum, MSI high,loss of MLH1 and PMS2 expression ? CT abdomen/pelvis 06/26/2019-thickening within the distal transverse colon, severely atrophic and calcified right kidney ? CT chest 06/28/2019-no evidence of metastatic disease, stable 3 mm right lower lobe nodule ? Cycle 1 FOLFOX 08/04/2019 (oxaliplatin 65 mg/m due to renal function) ? Plan for repeat CTs after he has completed 5 cycles of chemotherapy 2. Asthma 3. 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 Microcytic anemia, likely iron deficiency anemia secondary  to #1 7. Port-A-Cath placement on 12/15/2019, Dr. Rosendo Gros  Disposition: [name] appears stable.  He has completed 1 cycle of FOLFOX.  He tolerated the chemotherapy well overall.  Plan to proceed with cycle 2 today as scheduled.  He understands to contact the office if he develops mouth sores or has any difficulty with oral intake.  We reviewed the CBC and chemistry panel from today.  Labs are adequate to proceed with treatment.  He will return for lab, follow-up, cycle 3 FOLFOX in 2 weeks.  He will contact the office in the interim as outlined above or with any other problems.    [name] ANP/GNP-BC   08/18/2019  10:19 AM

## 2019-08-18 NOTE — Patient Instructions (Addendum)
[facility] Discharge Instructions for Patients Receiving Chemotherapy  Today you received the following chemotherapy agents Oxaliplatin (ELOXATIN), Leucovorin & Flourouracil (ADRUCIL).  To help prevent nausea and vomiting after your treatment, we encourage you to take your nausea medication as prescribed.   If you develop nausea and vomiting that is not controlled by your nausea medication, call the clinic.   BELOW ARE SYMPTOMS THAT SHOULD BE REPORTED IMMEDIATELY:  *FEVER GREATER THAN 100.5 F  *CHILLS WITH OR WITHOUT FEVER  NAUSEA AND VOMITING THAT IS NOT CONTROLLED WITH YOUR NAUSEA MEDICATION  *UNUSUAL SHORTNESS OF BREATH  *UNUSUAL BRUISING OR BLEEDING  TENDERNESS IN MOUTH AND THROAT WITH OR WITHOUT PRESENCE OF ULCERS  *URINARY PROBLEMS  *BOWEL PROBLEMS  UNUSUAL RASH Items with * indicate a potential emergency and should be followed up as soon as possible.  Feel free to call the clinic should you have any questions or concerns. The clinic phone number is [phone].  Please show the [place] at check-in to the Emergency Department and triage nurse.   Coronavirus (COVID-19) Are you at risk?  Are you at risk for the Coronavirus (COVID-19)?  To be considered HIGH RISK for Coronavirus (COVID-19), you have to meet the following criteria:  . Traveled to [place], [place], [place], [place] or [place]; or in the [place] to [place], [place], [place], or [place]; and have fever, cough, and shortness of breath within the last 2 weeks of travel OR . Been in close contact with a person diagnosed with COVID-19 within the last 2 weeks and have fever, cough, and shortness of breath . IF YOU DO NOT MEET THESE CRITERIA, YOU ARE CONSIDERED LOW RISK FOR COVID-19.  What to do if you are HIGH RISK for COVID-19?  Marland Kitchen If you are having a medical emergency, call 911. . Seek medical care right away. Before you go to a doctor's office, urgent care or  emergency department, call ahead and tell them about your recent travel, contact with someone diagnosed with COVID-19, and your symptoms. You should receive instructions from your physician's office regarding next steps of care.  . When you arrive at healthcare provider, tell the healthcare staff immediately you have returned from visiting [place], [place], [place], [place] or [place]; or traveled in the [place] to [place], [place], [place], or [place]; in the last two weeks or you have been in close contact with a person diagnosed with COVID-19 in the last 2 weeks.   . Tell the health care staff about your symptoms: fever, cough and shortness of breath. . After you have been seen by a medical provider, you will be either: o Tested for (COVID-19) and discharged home on quarantine except to seek medical care if symptoms worsen, and asked to  - Stay home and avoid contact with others until you get your results (4-5 days)  - Avoid travel on public transportation if possible (such as bus, train, or airplane) or o Sent to the Emergency Department by EMS for evaluation, COVID-19 testing, and possible admission depending on your condition and test results.  What to do if you are LOW RISK for COVID-19?  Reduce your risk of any infection by using the same precautions used for avoiding the common cold or flu:  Marland Kitchen Wash your hands often with soap and warm water for at least 20 seconds.  If soap and water are not readily available, use an alcohol-based hand sanitizer with at least 60% alcohol.  Marland Kitchen  If coughing or sneezing, cover your mouth and nose by coughing or sneezing into the elbow areas of your shirt or coat, into a tissue or into your sleeve (not your hands). . Avoid shaking hands with others and consider head nods or verbal greetings only. . Avoid touching your eyes, nose, or mouth with unwashed hands.  . Avoid close contact with people who are sick. . Avoid places or events with large numbers  of people in one location, like concerts or sporting events. . Carefully consider travel plans you have or are making. . If you are planning any travel outside or inside the [place], visit the CDC's Travelers' Health webpage for the latest health notices. . If you have some symptoms but not all symptoms, continue to monitor at home and seek medical attention if your symptoms worsen. . If you are having a medical emergency, call 911.   [place] / e-Visit: [email or web address]         [facility]: [facility]: [phone]                   [facility]: [phone]

## 2019-08-20 ENCOUNTER — Other Ambulatory Visit: Payer: Self-pay

## 2019-08-20 ENCOUNTER — Inpatient Hospital Stay: Payer: Medicare Other

## 2019-08-20 VITALS — BP 151/82 | HR 60 | Temp 98.3°F | Resp 18

## 2019-08-20 DIAGNOSIS — C184 Malignant neoplasm of transverse colon: Secondary | ICD-10-CM

## 2019-08-20 DIAGNOSIS — Z5111 Encounter for antineoplastic chemotherapy: Secondary | ICD-10-CM | POA: Diagnosis not present

## 2019-08-20 MED ORDER — SODIUM CHLORIDE 0.9% FLUSH
10.0000 mL | INTRAVENOUS | Status: DC | PRN
  Administered 2019-08-20: 13:00:00 10 mL
  Filled 2019-08-20: qty 10

## 2019-08-20 MED ORDER — HEPARIN SOD (PORK) LOCK FLUSH 100 UNIT/ML IV SOLN
500.0000 [IU] | Freq: Once | INTRAVENOUS | Status: AC | PRN
  Administered 2019-08-20: 500 [IU]
  Filled 2019-08-20: qty 5

## 2019-08-22 ENCOUNTER — Telehealth: Payer: Self-pay | Admitting: Oncology

## 2019-08-22 ENCOUNTER — Other Ambulatory Visit: Payer: Self-pay | Admitting: Nurse Practitioner

## 2019-08-22 DIAGNOSIS — C184 Malignant neoplasm of transverse colon: Secondary | ICD-10-CM

## 2019-08-22 NOTE — Telephone Encounter (Signed)
Scheduled per los. Called and left msg about added appt. Mailed printout  

## 2019-08-24 ENCOUNTER — Telehealth: Payer: Self-pay | Admitting: Oncology

## 2019-08-24 ENCOUNTER — Other Ambulatory Visit (HOSPITAL_COMMUNITY): Payer: Self-pay

## 2019-08-24 NOTE — Telephone Encounter (Signed)
Moved 2/4 appt to 2/3 per providers request. Called and spoke with patient. Confirmed change in appt

## 2019-08-28 ENCOUNTER — Other Ambulatory Visit: Payer: Self-pay | Admitting: Oncology

## 2019-08-29 IMAGING — DX DG CHEST 1V PORT
1 series · 1 of 1 positions shown · non-contrast
Comparison: 10/29/2016

CLINICAL DATA: Shortness of Breath

EXAM:
PORTABLE CHEST 1 VIEW

[chest ap]
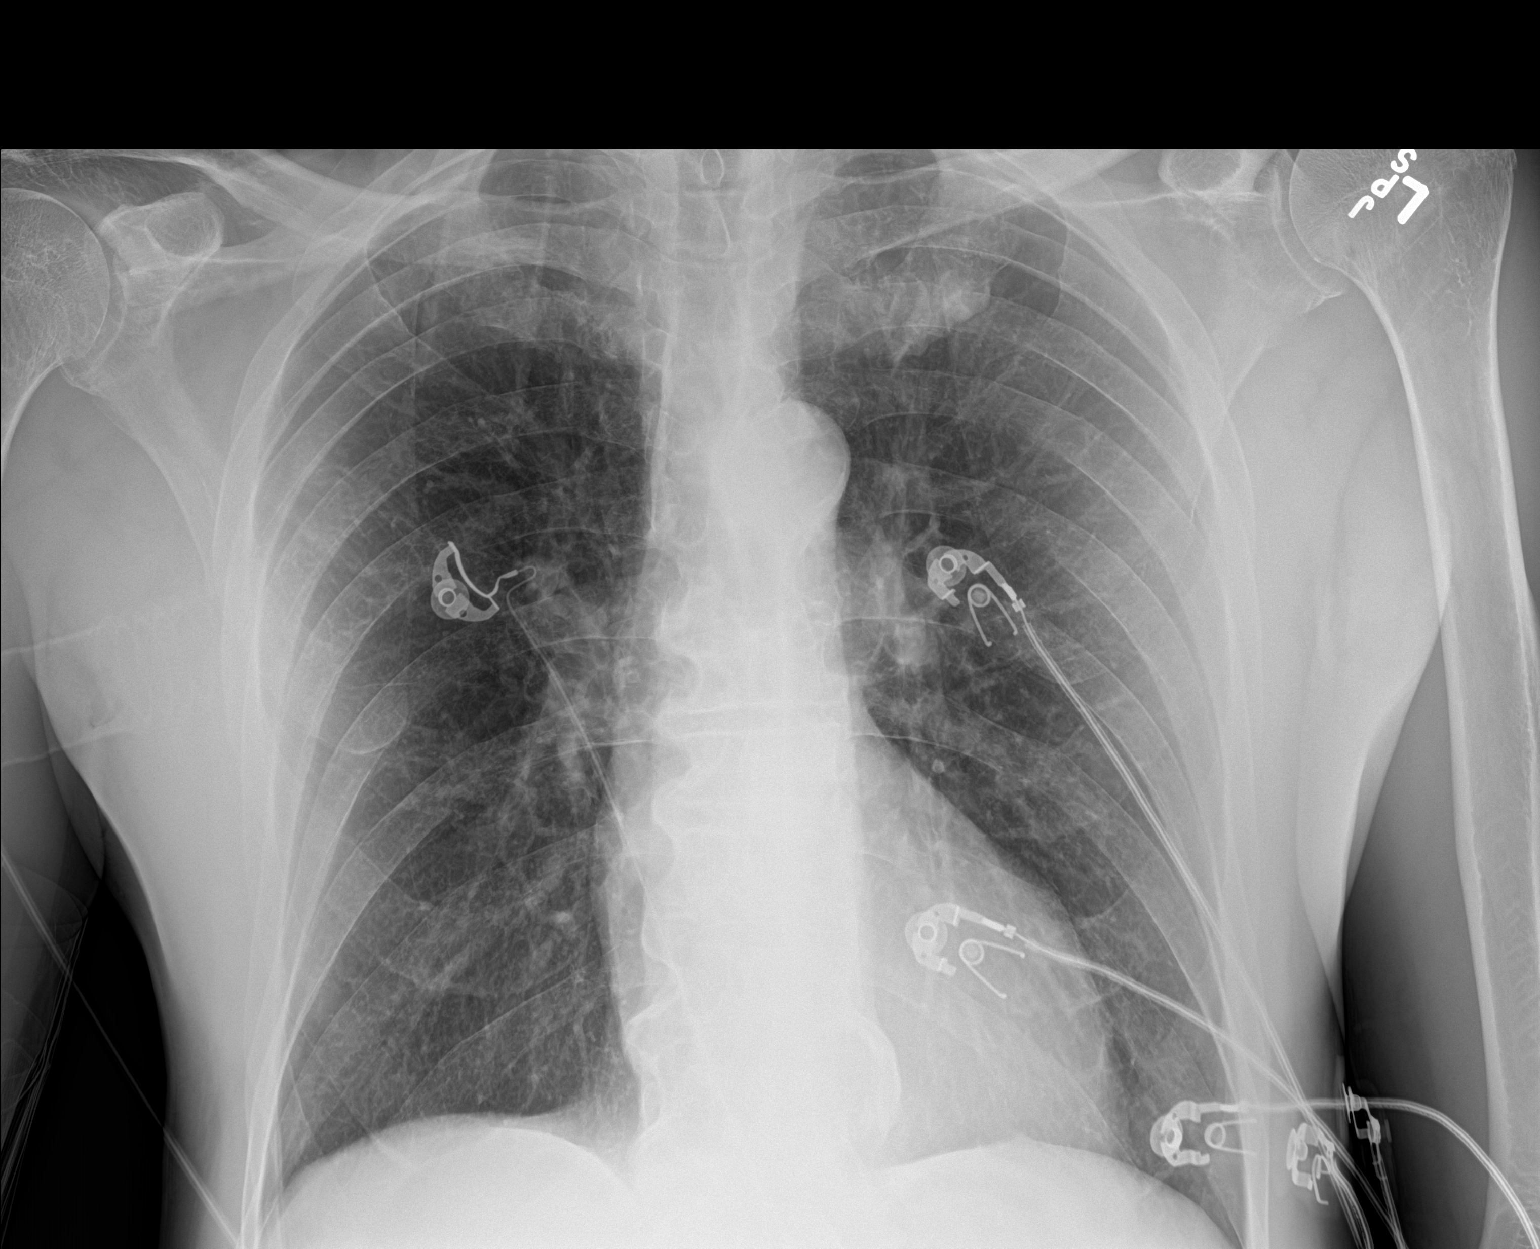

[1 of 1 positions shown; findings below may reference images not displayed]

FINDINGS: Mild hyperinflation. Heart and mediastinal contours are within
normal limits. No focal opacities or effusions. No acute bony
abnormality.
IMPRESSION: Hyperinflation.  No active disease.

## 2019-08-29 NOTE — Progress Notes (Signed)
Paramedicine Encounter    Patient ID: Joe Garcia, male    DOB: [birth year], 69 y.o.   MRN: [id number]   Patient Care Team: Medicine, [facility] as PCP - General [name], LCSW as Social Worker (Licensed Clinical Social Worker)  Patient Active Problem List   Diagnosis Date Noted  . Port-A-Cath in place 08/04/2019  . Chronic systolic heart failure ([place])   . Colonic obstruction ([place])   . 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 Nausea & vomiting 06/26/2019  . Colonic mass 06/26/2019  . COPD GOLD ?  11/24/2018  . Chest pain   . NSTEMI (non-ST elevated myocardial infarction) ([place]) 10/03/2018  . COPD with acute exacerbation ([place]) 07/26/2018  . ARF (acute renal failure) ([place]) 07/26/2018  . Nausea and vomiting 07/26/2018  . Weight loss, unintentional 07/26/2018  . Malnutrition of moderate degree 07/16/2017  . 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 Solitary kidney, congenital 06/23/2017  . Nonischemic cardiomyopathy ([place])   . Multifocal PVCs   . Chronic combined systolic (congestive) and diastolic (congestive) heart failure ([place]) 06/11/2017  . Hypokalemia 05/05/2017  . DOE (dyspnea on exertion) 07/16/2013  . Alcohol abuse 09/25/2012  . Alcohol dependence ([place]) 09/22/2012  . Essential hypertension 03/22/2007  . DEGENERATIVE DISC DISEASE 03/22/2007  . AVASCULAR NECROSIS 03/22/2007    Current Outpatient Medications:  .  acetaminophen (TYLENOL) 500 MG tablet, Take 2 tablets (1,000 mg total) by mouth 3 (three) times daily as needed for headache (pain)., Disp: 30 tablet, Rfl: 0 .  albuterol (PROVENTIL) (2.5 MG/3ML) 0.083% nebulizer solution, Take 3 mLs (2.5 mg total) by nebulization every 6 (six) hours as needed for wheezing or shortness of breath., Disp: 75 mL, Rfl: 0 .  albuterol (VENTOLIN HFA) 108 (90 Base) MCG/ACT inhaler, Inhale 1-2 puffs into the lungs every 4 (four) hours as needed for wheezing or shortness of breath.,  Disp: 18 g, Rfl: 0 .  amiodarone (PACERONE) 100 MG tablet, Take 1 tablet (100 mg total) by mouth daily., Disp: 30 tablet, Rfl: 6 .  budesonide-formoterol (SYMBICORT) 160-4.5 MCG/ACT inhaler, Inhale 2 puffs into the lungs 2 (two) times daily., Disp: 1 Inhaler, Rfl: 11 .  carvedilol (COREG) 3.125 MG tablet, Take 1 tablet (3.125 mg total) by mouth 2 (two) times daily with a meal., Disp: 60 tablet, Rfl: 5 .  fluticasone (FLONASE) 50 MCG/ACT nasal spray, Place 2 sprays into both nostrils daily., Disp: 16 g, Rfl: 0 .  furosemide (LASIX) 20 MG tablet, Take 1 tablet (20 mg total) by mouth daily., Disp: 30 tablet, Rfl: 5 .  isosorbide-hydrALAZINE (BIDIL) 20-37.5 MG tablet, Take 0.5 tablets by mouth 3 (three) times daily. , Disp: , Rfl:  .  lidocaine-prilocaine (EMLA) cream, Apply to portacath site 1-2 hours prior to use, Disp: 30 g, Rfl: 3 .  loratadine (CLARITIN) 10 MG tablet, Take 10 mg by mouth daily., Disp: , Rfl:  .  losartan (COZAAR) 25 MG tablet, TAKE 1 TABLET BY MOUTH EVERY DAY (Patient taking differently: Take 25 mg by mouth at bedtime. ), Disp: 30 tablet, Rfl: 6 .  montelukast (SINGULAIR) 10 MG tablet, Take 10 mg by mouth at bedtime., Disp: , Rfl:  .  polyethylene glycol powder (GLYCOLAX/MIRALAX) powder, Take 17 g by mouth daily as needed for mild constipation., Disp: , Rfl:  .  prochlorperazine (COMPAZINE) 10 MG tablet, Take 1 tablet (10 mg total) by mouth every 6 (six) hours as needed for nausea or vomiting., Disp:  30 tablet, Rfl: 3 .  spironolactone (ALDACTONE) 25 MG tablet, Take 1 tablet (25 mg total) by mouth daily., Disp: 30 tablet, Rfl: 3 .  traMADol (ULTRAM) 50 MG tablet, Take 1 tablet (50 mg total) by mouth every 6 (six) hours as needed., Disp: 20 tablet, Rfl: 0 Allergies  Allergen Reactions  . Lisinopril Swelling    Facial and tongue swelling 10/2012      Social History   Socioeconomic History  . Marital status: Single    Spouse name: Not on file  . Number of children: Not on  file  . Years of education: Not on file  . Highest education level: Not on file  Occupational History  . Occupation: "it don't work for me", on disability  Tobacco Use  . Smoking status: Former Smoker    Packs/day: 0.10    Years: 45.00    Pack years: 4.50    Quit date: 2012    Years since quitting: 9.0  . Smokeless tobacco: Never Used  Substance and Sexual Activity  . Alcohol use: No    Comment: Pt states no etoh since April 2014  . Drug use: No    Comment: Prior use of crack cocaine, quit 2012  . Sexual activity: Never  Other Topics Concern  . Not on file  Social History Narrative  . Not on file   Social Determinants of Health   Financial Resource Strain:   . Difficulty of Paying Living Expenses: Not on file  Food Insecurity:   . Worried About Charity fundraiser in the Last Year: Not on file  . Ran Out of Food in the Last Year: Not on file  Transportation Needs:   . Lack of Transportation (Medical): Not on file  . Lack of Transportation (Non-Medical): Not on file  Physical Activity:   . Days of Exercise per Week: Not on file  . Minutes of Exercise per Session: Not on file  Stress:   . Feeling of Stress : Not on file  Social Connections:   . Frequency of Communication with Friends and Family: Not on file  . Frequency of Social Gatherings with Friends and Family: Not on file  . Attends Religious Services: Not on file  . Active Member of Clubs or Organizations: Not on file  . Attends Archivist Meetings: Not on file  . Marital Status: Not on file  Intimate Partner Violence:   . Fear of Current or Ex-Partner: Not on file  . Emotionally Abused: Not on file  . Physically Abused: Not on file  . Sexually Abused: Not on file    Physical Exam      Future Appointments  Date Time Provider [place]  08/31/2019  9:45 AM [facility] LAB 3 [facility] None  08/31/2019 10:00 AM [facility] [place] None  08/31/2019 10:45 AM Owens Shark, NP  [facility] None  08/31/2019 12:00 PM [facility] INFUSION [facility] None  09/01/2019  2:30 PM Karie Mainland, RD [facility] None  09/02/2019 12:15 PM [facility] [place] FLUSH [facility] None  09/15/2019 10:15 AM [facility] LAB/FLUSH [facility] None  09/15/2019 10:30 AM [facility] [place] FLUSH [facility] None  09/15/2019 11:00 AM Ladell Pier, MD [facility] None  09/15/2019 12:00 PM [facility] INFUSION [facility] None  09/17/2019 10:30 AM [facility] FLUSH [facility] None    BP 132/70   Pulse 66   Temp 97.8 F (36.6 C)   Resp 18   Wt 148 lb (67.1 kg)   SpO2 98%   BMI  20.64 kg/m   Weight yesterday-145  Pt reports he is doing better, no sob, no dizziness, no h/a. he is undergoing CA tx but is feeling good. He denies any pain. Appetite is ok. No edema noted. meds verified and pill box checked and no mistakes noted.   Marylouise Stacks, [place] [facility] Paramedic  08/29/19

## 2019-08-31 ENCOUNTER — Inpatient Hospital Stay: Payer: Medicare Other

## 2019-08-31 ENCOUNTER — Encounter: Payer: Self-pay | Admitting: Nurse Practitioner

## 2019-08-31 ENCOUNTER — Inpatient Hospital Stay (HOSPITAL_BASED_OUTPATIENT_CLINIC_OR_DEPARTMENT_OTHER): Payer: Medicare Other | Admitting: Nurse Practitioner

## 2019-08-31 ENCOUNTER — Inpatient Hospital Stay: Payer: Medicare Other | Attending: Nurse Practitioner

## 2019-08-31 ENCOUNTER — Inpatient Hospital Stay: Payer: Medicare Other | Admitting: Nutrition

## 2019-08-31 ENCOUNTER — Other Ambulatory Visit: Payer: Self-pay

## 2019-08-31 VITALS — BP 143/70 | HR 60 | Temp 98.9°F | Resp 17 | Ht 71.0 in | Wt 151.3 lb

## 2019-08-31 DIAGNOSIS — Z5111 Encounter for antineoplastic chemotherapy: Secondary | ICD-10-CM | POA: Insufficient documentation

## 2019-08-31 DIAGNOSIS — C184 Malignant neoplasm of transverse colon: Secondary | ICD-10-CM

## 2019-08-31 DIAGNOSIS — Z8679 Personal history of other diseases of the circulatory system: Secondary | ICD-10-CM | POA: Insufficient documentation

## 2019-08-31 DIAGNOSIS — N261 Atrophy of kidney (terminal): Secondary | ICD-10-CM | POA: Diagnosis not present

## 2019-08-31 DIAGNOSIS — D509 Iron deficiency anemia, unspecified: Secondary | ICD-10-CM | POA: Insufficient documentation

## 2019-08-31 DIAGNOSIS — I5022 Chronic systolic (congestive) heart failure: Secondary | ICD-10-CM | POA: Diagnosis not present

## 2019-08-31 DIAGNOSIS — Z79899 Other long term (current) drug therapy: Secondary | ICD-10-CM | POA: Insufficient documentation

## 2019-08-31 DIAGNOSIS — D709 Neutropenia, unspecified: Secondary | ICD-10-CM | POA: Diagnosis not present

## 2019-08-31 DIAGNOSIS — J45909 Unspecified asthma, uncomplicated: Secondary | ICD-10-CM | POA: Diagnosis not present

## 2019-08-31 DIAGNOSIS — Z5189 Encounter for other specified aftercare: Secondary | ICD-10-CM | POA: Diagnosis not present

## 2019-08-31 LAB — CMP (CANCER CENTER ONLY)
ALT: 18 U/L (ref 0–44)
AST: 16 U/L (ref 15–41)
Albumin: 3.6 g/dL (ref 3.5–5.0)
Alkaline Phosphatase: 84 U/L (ref 38–126)
Anion gap: 8 (ref 5–15)
BUN: 16 mg/dL (ref 8–23)
CO2: 26 mmol/L (ref 22–32)
Calcium: 8.6 mg/dL — ABNORMAL LOW (ref 8.9–10.3)
Chloride: 106 mmol/L (ref 98–111)
Creatinine: 1.25 mg/dL — ABNORMAL HIGH (ref 0.61–1.24)
GFR, Est AFR Am: >60 mL/min (ref >60)
GFR, Estimated: 60 mL/min — ABNORMAL LOW (ref >60)
Glucose, Bld: 98 mg/dL (ref 70–99)
Potassium: 3.6 mmol/L (ref 3.5–5.1)
Sodium: 140 mmol/L (ref 135–145)
Total Bilirubin: 0.4 mg/dL (ref 0.3–1.2)
Total Protein: 6.5 g/dL (ref 6.5–8.1)

## 2019-08-31 LAB — CBC WITH DIFFERENTIAL (CANCER CENTER ONLY)
Abs Immature Granulocytes: 0 10*3/uL (ref 0.00–0.07)
Basophils Absolute: 0.1 10*3/uL (ref 0.0–0.1)
Basophils Relative: 1 %
Eosinophils Absolute: 0.2 10*3/uL (ref 0.0–0.5)
Eosinophils Relative: 5 %
HCT: 36.7 % — ABNORMAL LOW (ref 39.0–52.0)
Hemoglobin: 12.1 g/dL — ABNORMAL LOW (ref 13.0–17.0)
Immature Granulocytes: 0 %
Lymphocytes Relative: 49 %
Lymphs Abs: 2.1 10*3/uL (ref 0.7–4.0)
MCH: 27.1 pg (ref 26.0–34.0)
MCHC: 33 g/dL (ref 30.0–36.0)
MCV: 82.1 fL (ref 80.0–100.0)
Monocytes Absolute: 0.4 10*3/uL (ref 0.1–1.0)
Monocytes Relative: 9 %
Neutro Abs: 1.5 10*3/uL — ABNORMAL LOW (ref 1.7–7.7)
Neutrophils Relative %: 36 %
Platelet Count: 204 10*3/uL (ref 150–400)
RBC: 4.47 MIL/uL (ref 4.22–5.81)
RDW: 20.3 % — ABNORMAL HIGH (ref 11.5–15.5)
WBC Count: 4.2 10*3/uL (ref 4.0–10.5)
nRBC: 0 % (ref 0.0–0.2)

## 2019-08-31 MED ORDER — DEXAMETHASONE SODIUM PHOSPHATE 10 MG/ML IJ SOLN
INTRAMUSCULAR | Status: AC
  Filled 2019-08-31: qty 1

## 2019-08-31 MED ORDER — SODIUM CHLORIDE 0.9 % IV SOLN
2400.0000 mg/m2 | INTRAVENOUS | Status: DC
  Administered 2019-08-31: 15:00:00 4400 mg via INTRAVENOUS
  Filled 2019-08-31: qty 88

## 2019-08-31 MED ORDER — PALONOSETRON HCL INJECTION 0.25 MG/5ML
0.2500 mg | Freq: Once | INTRAVENOUS | Status: AC
  Administered 2019-08-31: 0.25 mg via INTRAVENOUS

## 2019-08-31 MED ORDER — DEXAMETHASONE SODIUM PHOSPHATE 10 MG/ML IJ SOLN
10.0000 mg | Freq: Once | INTRAMUSCULAR | Status: AC
  Administered 2019-08-31: 10 mg via INTRAVENOUS

## 2019-08-31 MED ORDER — OXALIPLATIN CHEMO INJECTION 100 MG/20ML
65.0000 mg/m2 | Freq: Once | INTRAVENOUS | Status: AC
  Administered 2019-08-31: 13:00:00 120 mg via INTRAVENOUS
  Filled 2019-08-31: qty 24

## 2019-08-31 MED ORDER — FLUOROURACIL CHEMO INJECTION 2.5 GM/50ML
400.0000 mg/m2 | Freq: Once | INTRAVENOUS | Status: AC
  Administered 2019-08-31: 750 mg via INTRAVENOUS
  Filled 2019-08-31: qty 15

## 2019-08-31 MED ORDER — LEUCOVORIN CALCIUM INJECTION 350 MG
400.0000 mg/m2 | Freq: Once | INTRAVENOUS | Status: AC
  Administered 2019-08-31: 732 mg via INTRAVENOUS
  Filled 2019-08-31: qty 36.6

## 2019-08-31 MED ORDER — DEXTROSE 5 % IV SOLN
Freq: Once | INTRAVENOUS | Status: AC
  Filled 2019-08-31: qty 250

## 2019-08-31 MED ORDER — PALONOSETRON HCL INJECTION 0.25 MG/5ML
INTRAVENOUS | Status: AC
  Filled 2019-08-31: qty 5

## 2019-08-31 NOTE — Progress Notes (Addendum)
  [facility] OFFICE PROGRESS NOTE   Diagnosis: Colon cancer  INTERVAL HISTORY:   Mr. Cloe returns as scheduled.  He completed cycle 2 FOLFOX 08/18/2019.  He denies nausea/vomiting.  No mouth sores.  No diarrhea.  He noted cold sensitivity in the left hand for 1 day and the tongue for a day and a half.  Objective:  Vital signs in last 24 hours:  Blood pressure (!) 143/70, pulse 60, temperature 98.9 F (37.2 C), temperature source Temporal, resp. rate 17, height '5\' 11"'$  (1.803 m), weight 151 lb 4.8 oz (68.6 kg), SpO2 100 %.    HEENT: No thrush or ulcers. GI: Abdomen soft and nontender.  No hepatomegaly.  Well-healed midline incision. Vascular: No leg edema.  Skin: Palms dry appearing.  No erythema or skin breakdown. Port-A-Cath without erythema.   Lab Results:  Lab Results  Component Value Date   WBC 4.2 08/31/2019   HGB 12.1 (L) 08/31/2019   HCT 36.7 (L) 08/31/2019   MCV 82.1 08/31/2019   PLT 204 08/31/2019   NEUTROABS 1.5 (L) 08/31/2019    Imaging:  No results found.  Medications: I have reviewed the patient's current medications.  Assessment/Plan: 1. 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 Grade 3, lymphovascular and perineural invasion present, 4/18 lymph nodes positive, positive lymph nodes mesenteric margin, tumor invades into adherent omentum, MSI high,loss of MLH1 and PMS2 expression ? CT abdomen/pelvis 06/26/2019-thickening within the distal transverse colon, severely atrophic and calcified right kidney ? CT chest 06/28/2019-no evidence of metastatic disease, stable 3 mm right lower lobe nodule ? Cycle 1 FOLFOX 08/04/2019 (oxaliplatin 65 mg/m due to renal function) ? Plan for repeat CTs after he has completed 5 cycles of chemotherapy ? Cycle 2 FOLFOX 08/18/2019 ? Cycle 3 FOLFOX 08/31/2019, Udenyca added 2. Asthma 3. Chronic systolic heart failure 4. History of PVCs 5. Atrophic  right kidney 6. Microcytic anemia, likely iron deficiency anemia secondary to #1 7. Port-A-Cath placement on 12/15/2019, Dr. Rosendo Gros   Disposition: Mr. Genet appears stable.  He has completed 2 cycles of FOLFOX.  Plan to proceed with cycle 3 today as scheduled.  We reviewed the CBC from today.  He has mild neutropenia.  He will receive Udenyca on the day of pump discontinuation.  We reviewed potential toxicities associated with Udenyca including bone pain, rash, splenic rupture.  He agrees to proceed.  He will return for lab, follow-up, cycle 4 FOLFOX in 2 weeks.  He will contact the office in the interim with any problems.  Patient seen with Dr. [name].    [name] ANP/GNP-BC   08/31/2019  11:13 AM This was a shared visit with [name].  Mr. Hardge is tolerating the FOLFOX well.  He has mild neutropenia.  Ellen Henri will be added to the chemotherapy regimen with this cycle.  Julieanne Manson, MD

## 2019-08-31 NOTE — Progress Notes (Signed)
Nutrition follow up completed with patient during infusion for colon cancer. Weight improved and documented as 151.3 pounds on Feb 3 from 137 pounds on Jan 5. Noted creatinine 1.25. Patient denies N, V, C and D. He denies mouth sores. He is receiving cycle 3 of FOLFOX. Reports eating well and drinking Ensure Enlive. Reports he has enough samples for now.  Nutrition Diagnosis: Unintended weight loss improved.  Intervention: Educated patient to continue strategies for increased calories and protein in addition to [employer] as needed. Will provide additional samples as needed.  Monitoring, Evaluation, Goals: Patient will continue increased calories and protein to minimize weight loss.  Next Visit: Thursday, Feb 18 during infusion.

## 2019-08-31 NOTE — Patient Instructions (Signed)
[facility] Discharge Instructions for Patients Receiving Chemotherapy  Today you received the following chemotherapy agents: Oxaliplatin (Eloxatin), Leucovorin, Fluorouracil (Adrucil, 5-FU)  To help prevent nausea and vomiting after your treatment, we encourage you to take your nausea medication as directed.   If you develop nausea and vomiting that is not controlled by your nausea medication, call the clinic.   BELOW ARE SYMPTOMS THAT SHOULD BE REPORTED IMMEDIATELY:  *FEVER GREATER THAN 100.5 F  *CHILLS WITH OR WITHOUT FEVER  NAUSEA AND VOMITING THAT IS NOT CONTROLLED WITH YOUR NAUSEA MEDICATION  *UNUSUAL SHORTNESS OF BREATH  *UNUSUAL BRUISING OR BLEEDING  TENDERNESS IN MOUTH AND THROAT WITH OR WITHOUT PRESENCE OF ULCERS  *URINARY PROBLEMS  *BOWEL PROBLEMS  UNUSUAL RASH Items with * indicate a potential emergency and should be followed up as soon as possible.  Feel free to call the clinic should you have any questions or concerns. The clinic phone number is [phone].  Please show the CHEMO ALERT CARD at check-in to the Emergency Department and triage nurse.  Coronavirus (COVID-19) Are you at risk?  Are you at risk for the Coronavirus (COVID-19)?  To be considered HIGH RISK for Coronavirus (COVID-19), you have to meet the following criteria:  . Traveled to [place], [place], [place], [place] or [place]; or in the [place] to [place], [place], [place], or [place]; and have fever, cough, and shortness of breath within the last 2 weeks of travel OR . Been in close contact with a person diagnosed with COVID-19 within the last 2 weeks and have fever, cough, and shortness of breath . IF YOU DO NOT MEET THESE CRITERIA, YOU ARE CONSIDERED LOW RISK FOR COVID-19.  What to do if you are HIGH RISK for COVID-19?  . If you are having a medical emergency, call 911. . Seek medical care right away. Before you go to a doctor's office, urgent care or  emergency department, call ahead and tell them about your recent travel, contact with someone diagnosed with COVID-19, and your symptoms. You should receive instructions from your physician's office regarding next steps of care.  . When you arrive at healthcare provider, tell the healthcare staff immediately you have returned from visiting [place], [place], [place], [place] or [place]; or traveled in the [place] to [place], [place], [place], or [place]; in the last two weeks or you have been in close contact with a person diagnosed with COVID-19 in the last 2 weeks.   . Tell the health care staff about your symptoms: fever, cough and shortness of breath. . After you have been seen by a medical provider, you will be either: o Tested for (COVID-19) and discharged home on quarantine except to seek medical care if symptoms worsen, and asked to  - Stay home and avoid contact with others until you get your results (4-5 days)  - Avoid travel on public transportation if possible (such as bus, train, or airplane) or o Sent to the Emergency Department by EMS for evaluation, COVID-19 testing, and possible admission depending on your condition and test results.  What to do if you are LOW RISK for COVID-19?  Reduce your risk of any infection by using the same precautions used for avoiding the common cold or flu:  . Wash your hands often with soap and warm water for at least 20 seconds.  If soap and water are not readily available, use an alcohol-based hand sanitizer with at least 60% alcohol.  .   If coughing or sneezing, cover your mouth and nose by coughing or sneezing into the elbow areas of your shirt or coat, into a tissue or into your sleeve (not your hands). . Avoid shaking hands with others and consider head nods or verbal greetings only. . Avoid touching your eyes, nose, or mouth with unwashed hands.  . Avoid close contact with people who are sick. . Avoid places or events with large numbers  of people in one location, like concerts or sporting events. . Carefully consider travel plans you have or are making. . If you are planning any travel outside or inside the [place], visit the CDC's Travelers' Health webpage for the latest health notices. . If you have some symptoms but not all symptoms, continue to monitor at home and seek medical attention if your symptoms worsen. . If you are having a medical emergency, call 911.   [place] / e-Visit: [email or web address]         [facility]: [facility]: [phone]                   [facility]: [phone]

## 2019-09-01 ENCOUNTER — Telehealth: Payer: Self-pay | Admitting: Oncology

## 2019-09-01 ENCOUNTER — Inpatient Hospital Stay: Payer: Medicare Other | Admitting: Oncology

## 2019-09-01 ENCOUNTER — Inpatient Hospital Stay: Payer: Medicare Other

## 2019-09-01 ENCOUNTER — Inpatient Hospital Stay: Payer: Medicare Other | Admitting: Nutrition

## 2019-09-01 NOTE — Telephone Encounter (Signed)
Scheduled per los. Called and left msg. Mailed printout  °

## 2019-09-02 ENCOUNTER — Other Ambulatory Visit: Payer: Self-pay

## 2019-09-02 ENCOUNTER — Inpatient Hospital Stay: Payer: Medicare Other

## 2019-09-02 VITALS — BP 142/72 | HR 62 | Temp 98.2°F | Resp 18

## 2019-09-02 DIAGNOSIS — C184 Malignant neoplasm of transverse colon: Secondary | ICD-10-CM

## 2019-09-02 DIAGNOSIS — Z5111 Encounter for antineoplastic chemotherapy: Secondary | ICD-10-CM | POA: Diagnosis not present

## 2019-09-02 MED ORDER — PEGFILGRASTIM-CBQV 6 MG/0.6ML ~~LOC~~ SOSY
PREFILLED_SYRINGE | SUBCUTANEOUS | Status: AC
  Filled 2019-09-02: qty 0.6

## 2019-09-02 MED ORDER — HEPARIN SOD (PORK) LOCK FLUSH 100 UNIT/ML IV SOLN
500.0000 [IU] | Freq: Once | INTRAVENOUS | Status: AC | PRN
  Administered 2019-09-02: 500 [IU]
  Filled 2019-09-02: qty 5

## 2019-09-02 MED ORDER — PEGFILGRASTIM-CBQV 6 MG/0.6ML ~~LOC~~ SOSY
6.0000 mg | PREFILLED_SYRINGE | Freq: Once | SUBCUTANEOUS | Status: AC
  Administered 2019-09-02: 14:00:00 6 mg via SUBCUTANEOUS

## 2019-09-02 MED ORDER — SODIUM CHLORIDE 0.9% FLUSH
10.0000 mL | INTRAVENOUS | Status: DC | PRN
  Administered 2019-09-02: 14:00:00 10 mL
  Filled 2019-09-02: qty 10

## 2019-09-02 NOTE — Patient Instructions (Signed)

## 2019-09-08 ENCOUNTER — Ambulatory Visit: Payer: Medicare Other | Attending: Internal Medicine

## 2019-09-08 DIAGNOSIS — Z20822 Contact with and (suspected) exposure to covid-19: Secondary | ICD-10-CM

## 2019-09-10 LAB — NOVEL CORONAVIRUS, NAA: SARS-CoV-2, NAA: NOT DETECTED

## 2019-09-11 ENCOUNTER — Other Ambulatory Visit: Payer: Self-pay | Admitting: Oncology

## 2019-09-13 ENCOUNTER — Telehealth: Payer: Self-pay | Admitting: Oncology

## 2019-09-13 NOTE — Telephone Encounter (Signed)
Returned patient's phone call regarding cancelling 02/18 and 02/20 appointment, patient has a death in the family and will not be able to come in for his appointments.  Message to provider.

## 2019-09-13 NOTE — Telephone Encounter (Signed)
Please schedule office visit and FOLFOX for the week of 09/19/2019

## 2019-09-15 ENCOUNTER — Inpatient Hospital Stay: Payer: Medicare Other

## 2019-09-15 ENCOUNTER — Inpatient Hospital Stay: Payer: Medicare Other | Admitting: Nutrition

## 2019-09-15 ENCOUNTER — Inpatient Hospital Stay: Payer: Medicare Other | Admitting: Oncology

## 2019-09-19 ENCOUNTER — Telehealth (HOSPITAL_COMMUNITY): Payer: Self-pay

## 2019-09-19 ENCOUNTER — Telehealth: Payer: Self-pay | Admitting: Oncology

## 2019-09-19 NOTE — Telephone Encounter (Signed)
Contacted pt today regarding home visit this week.  He states he has a lot going on this week. His sister recently passed away and he has his chemo treatments this week.  He wants me to call him back next week to sch visit   Marylouise Stacks, EMT-Paramedic  09/19/19

## 2019-09-19 NOTE — Telephone Encounter (Signed)
Scheduled appt per 2/17 sch message - pt aware of appt date and time

## 2019-09-20 ENCOUNTER — Emergency Department (HOSPITAL_COMMUNITY)
Admission: EM | Admit: 2019-09-20 | Discharge: 2019-09-21 | Disposition: A | Discharge status: Home / Self Care | Payer: Medicare Other | Attending: Emergency Medicine | Admitting: Emergency Medicine

## 2019-09-20 ENCOUNTER — Encounter (HOSPITAL_COMMUNITY): Payer: Self-pay

## 2019-09-20 ENCOUNTER — Emergency Department (HOSPITAL_COMMUNITY): Payer: Medicare Other

## 2019-09-20 DIAGNOSIS — Z87891 Personal history of nicotine dependence: Secondary | ICD-10-CM | POA: Diagnosis not present

## 2019-09-20 DIAGNOSIS — I10 Essential (primary) hypertension: Secondary | ICD-10-CM | POA: Insufficient documentation

## 2019-09-20 DIAGNOSIS — R05 Cough: Secondary | ICD-10-CM | POA: Diagnosis not present

## 2019-09-20 DIAGNOSIS — N289 Disorder of kidney and ureter, unspecified: Secondary | ICD-10-CM | POA: Diagnosis not present

## 2019-09-20 DIAGNOSIS — U071 COVID-19: Secondary | ICD-10-CM | POA: Insufficient documentation

## 2019-09-20 DIAGNOSIS — Z85038 Personal history of other malignant neoplasm of large intestine: Secondary | ICD-10-CM | POA: Diagnosis not present

## 2019-09-20 DIAGNOSIS — Z79899 Other long term (current) drug therapy: Secondary | ICD-10-CM | POA: Insufficient documentation

## 2019-09-20 DIAGNOSIS — D696 Thrombocytopenia, unspecified: Secondary | ICD-10-CM | POA: Diagnosis not present

## 2019-09-20 DIAGNOSIS — R0602 Shortness of breath: Secondary | ICD-10-CM | POA: Diagnosis present

## 2019-09-20 DIAGNOSIS — J441 Chronic obstructive pulmonary disease with (acute) exacerbation: Secondary | ICD-10-CM | POA: Insufficient documentation

## 2019-09-20 LAB — CBC
HCT: 46.1 % (ref 39.0–52.0)
Hemoglobin: 14.9 g/dL (ref 13.0–17.0)
MCH: 26.9 pg (ref 26.0–34.0)
MCHC: 32.3 g/dL (ref 30.0–36.0)
MCV: 83.4 fL (ref 80.0–100.0)
Platelets: 139 10*3/uL — ABNORMAL LOW (ref 150–400)
RBC: 5.53 MIL/uL (ref 4.22–5.81)
RDW: 20.8 % — ABNORMAL HIGH (ref 11.5–15.5)
WBC: 5.2 10*3/uL (ref 4.0–10.5)
nRBC: 0 % (ref 0.0–0.2)

## 2019-09-20 LAB — BASIC METABOLIC PANEL
Anion gap: 13 (ref 5–15)
BUN: 15 mg/dL (ref 8–23)
CO2: 21 mmol/L — ABNORMAL LOW (ref 22–32)
Calcium: 9.5 mg/dL (ref 8.9–10.3)
Chloride: 105 mmol/L (ref 98–111)
Creatinine, Ser: 1.38 mg/dL — ABNORMAL HIGH (ref 0.61–1.24)
GFR calc Af Amer: >60 mL/min (ref >60)
GFR calc non Af Amer: 53 mL/min — ABNORMAL LOW (ref >60)
Glucose, Bld: 96 mg/dL (ref 70–99)
Potassium: 4.2 mmol/L (ref 3.5–5.1)
Sodium: 139 mmol/L (ref 135–145)

## 2019-09-20 LAB — TROPONIN I (HIGH SENSITIVITY): Troponin I (High Sensitivity): 12 ng/L (ref <18)

## 2019-09-20 NOTE — ED Triage Notes (Signed)
Pt came in POV with c/o of [place] that started Sunday. Pt states he got his COVID shot today. He states he has been "trying to fight this cold". Pt has a hx of cancer and is actively recieving chemo treatments. Pt does not seem to be in any distress at this time.

## 2019-09-21 ENCOUNTER — Other Ambulatory Visit: Payer: Medicare Other

## 2019-09-21 ENCOUNTER — Encounter: Payer: Self-pay | Admitting: *Deleted

## 2019-09-21 ENCOUNTER — Inpatient Hospital Stay: Payer: Medicare Other

## 2019-09-21 ENCOUNTER — Ambulatory Visit: Payer: Medicare Other

## 2019-09-21 ENCOUNTER — Telehealth: Payer: Self-pay | Admitting: Adult Health

## 2019-09-21 ENCOUNTER — Other Ambulatory Visit: Payer: Self-pay | Admitting: Adult Health

## 2019-09-21 ENCOUNTER — Other Ambulatory Visit (HOSPITAL_COMMUNITY): Payer: Self-pay

## 2019-09-21 ENCOUNTER — Telehealth: Payer: Self-pay | Admitting: Oncology

## 2019-09-21 ENCOUNTER — Ambulatory Visit: Payer: Medicare Other | Admitting: Nurse Practitioner

## 2019-09-21 ENCOUNTER — Emergency Department (HOSPITAL_COMMUNITY): Payer: Medicare Other

## 2019-09-21 DIAGNOSIS — U071 COVID-19: Secondary | ICD-10-CM | POA: Diagnosis not present

## 2019-09-21 LAB — D-DIMER, QUANTITATIVE: D-Dimer, Quant: 0.99 ug/mL-FEU — ABNORMAL HIGH (ref 0.00–0.50)

## 2019-09-21 LAB — SARS CORONAVIRUS 2 BY RT PCR (DIASORIN): SARS Coronavirus 2: POSITIVE — AB

## 2019-09-21 LAB — TROPONIN I (HIGH SENSITIVITY): Troponin I (High Sensitivity): 13 ng/L (ref <18)

## 2019-09-21 MED ORDER — IOHEXOL 350 MG/ML SOLN
75.0000 mL | Freq: Once | INTRAVENOUS | Status: AC | PRN
  Administered 2019-09-21: 100 mL via INTRAVENOUS

## 2019-09-21 MED ORDER — PREDNISONE 20 MG PO TABS
60.0000 mg | ORAL_TABLET | Freq: Once | ORAL | Status: AC
  Administered 2019-09-21: 60 mg via ORAL
  Filled 2019-09-21: qty 3

## 2019-09-21 MED ORDER — PREDNISONE 50 MG PO TABS: 50.0000 mg | ORAL_TABLET | Freq: Every day | ORAL | 0 refills | Status: DC

## 2019-09-21 MED ORDER — ALBUTEROL SULFATE HFA 108 (90 BASE) MCG/ACT IN AERS
8.0000 | INHALATION_SPRAY | Freq: Once | RESPIRATORY_TRACT | Status: AC
  Administered 2019-09-21: 8 via RESPIRATORY_TRACT

## 2019-09-21 MED ORDER — ALBUTEROL SULFATE HFA 108 (90 BASE) MCG/ACT IN AERS
8.0000 | INHALATION_SPRAY | Freq: Once | RESPIRATORY_TRACT | Status: AC
  Administered 2019-09-21: 8 via RESPIRATORY_TRACT
  Filled 2019-09-21: qty 6.7

## 2019-09-21 MED ORDER — ALBUTEROL SULFATE HFA 108 (90 BASE) MCG/ACT IN AERS: 2.0000 | INHALATION_SPRAY | RESPIRATORY_TRACT | 0 refills | Status: DC | PRN

## 2019-09-21 NOTE — Progress Notes (Signed)
Canceled appointments today due to positive COVID test result. Scheduling message sent to reschedule appointments to 10/12/19 and to cancel 10/05/19 appointments to allow 21 day isolation.

## 2019-09-21 NOTE — ED Provider Notes (Signed)
[facility] EMERGENCY DEPARTMENT Provider Note   CSN: [id number] Arrival date & time: 09/20/19  2014   History Chief Complaint  Patient presents with   Shortness of Breath    Joe Garcia is a 69 y.o. male.  The history is provided by the patient.  Shortness of Breath He has history of hypertension, hyperlipidemia, COPD, colon cancer and comes in with cough and shortness of breath for the last 3 days.  Symptoms have been getting worse.  Cough is productive of thick, white sputum.  Symptoms are worse with exertion.  He denies chest pain, heaviness, tightness, pressure.  He states that he has had fever, but states his temperature has only been as high as 98.  He has had chills and sweats, but it is normal for him to have these during any kind of respiratory illness.  He denies loss of smell or taste.  He did receive his first dose of COVID-19 vaccine today.  Test for COVID-19 1 week ago was negative.  He denies exposure to COVID-19.  At home, he did use his nebulizer, but it did not seem to help.  He does state his symptoms feel like a flareup of his asthma.  Past Medical History:  Diagnosis Date   Alcoholism ([place])    Arthritis    hips, L shoulder   Asthma    Cancer ([place])    colon cancer   CHF (congestive heart failure) (HCC)    Chronic kidney disease    only has one kidney   Depression    pt denies   Dyspnea    Dysrhythmia    PVC's   Family history of anesthesia complication    GERD (gastroesophageal reflux disease)    Hyperlipidemia    Hypertension    Pneumonia     Patient Active Problem List   Diagnosis Date Noted   Port-A-Cath in place [id number]   Chronic systolic heart failure ([place])    Colonic obstruction ([place])    Cancer of transverse colon s/p partial colectomy 06/30/2019    Nausea & vomiting 06/26/2019   Colonic mass 06/26/2019   COPD GOLD ?  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/11/2017   Hypokalemia 05/05/2017   DOE (dyspnea on exertion) 07/16/2013   Alcohol abuse 09/25/2012   Alcohol dependence ([place]) 09/22/2012   Essential hypertension 03/22/2007   DEGENERATIVE [place] DISEASE 03/22/2007   AVASCULAR NECROSIS 03/22/2007    Past Surgical History:  Procedure Laterality Date   BIOPSY  06/27/2019   Procedure: BIOPSY;  Surgeon: Yetta Flock, MD;  Location: [place];  Service: Gastroenterology;;   COLONOSCOPY Left 06/27/2019   Procedure: COLONOSCOPY;  Surgeon: Yetta Flock, MD;  Location: [facility] END[facility]COPY;  Service: Gastroenterology;  Laterality: Left;   LAPAROTOMY N/A 06/30/2019   Procedure: EXPLORATORY LAPAROTOMY;  Surgeon: Ralene Ok, MD;  Location: [place];  Service: General;  Laterality: N/A;   LEFT HEART CATH AND CORONARY ANGIOGRAPHY N/A 10/04/2018   Procedure: LEFT HEART CATH AND CORONARY ANGIOGRAPHY;  Surgeon: Jettie Booze, MD;  Location: [place] CV LAB;  Service: Cardiovascular;  Laterality: N/A;   PARTIAL COLECTOMY N/A 06/30/2019   Procedure: PARTIAL COLECTOMY;  Surgeon: Ralene Ok, MD;  Location: [place];  Service: General;  Laterality: N/A;   PORTACATH PLACEMENT Left 08/02/2019   Procedure: INSERTION PORT-A-CATH LEFT CHEST;  Surgeon: Ralene Ok, MD;  Location: [place];  Service: General;  Laterality: Left;   RIGHT/LEFT HEART CATH AND CORONARY ANGIOGRAPHY N/A 06/12/2017   Procedure: RIGHT/LEFT HEART CATH AND CORONARY ANGIOGRAPHY;   Surgeon: Jolaine Artist, MD;  Location: [place] CV LAB;  Service: Cardiovascular;  Laterality: N/A;       Family History  Problem Relation Age of Onset   Heart disease Father    Heart disease Daughter        "fluid around heart"    Canavan disease Daughter    Cancer Daughter        unsure of type   Asthma Son    Heart failure Mother    Kidney failure Mother     Social History   Tobacco Use   Smoking status: Former Smoker    Packs/day: 0.10    Years: 45.00    Pack years: 4.50    Quit date: 2012    Years since quitting: 9.1   Smokeless tobacco: Never Used  Substance Use Topics   Alcohol use: No    Comment: Pt states no etoh since April 2014   Drug use: No    Comment: Prior use of crack cocaine, quit 2012    Home Medications Prior to Admission medications   Medication Sig Start Date End Date Taking? Authorizing Provider  acetaminophen (TYLENOL) 500 MG tablet Take 2 tablets (1,000 mg total) by mouth 3 (three) times daily as needed for headache (pain). 07/28/18   Dhungel, Flonnie Overman, MD  albuterol (PROVENTIL) (2.5 MG/3ML) 0.083% nebulizer solution Take 3 mLs (2.5 mg total) by nebulization every 6 (six) hours as needed for wheezing or shortness of breath. 08/18/19   Owens Shark, NP  albuterol (VENTOLIN HFA) 108 (90 Base) MCG/ACT inhaler Inhale 1-2 puffs into the lungs every 4 (four) hours as needed for wheezing or shortness of breath. 01/29/19   Hall-Potvin, [place], PA-C  amiodarone (PACERONE) 100 MG tablet Take 1 tablet (100 mg total) by mouth daily. 06/27/19   Larey Dresser, MD  budesonide-formoterol [facility]) 160-4.5 MCG/ACT inhaler Inhale 2 puffs into the lungs 2 (two) times daily. 11/23/18   Tanda Rockers, MD  carvedilol (COREG) 3.125 MG tablet Take 1 tablet (3.125 mg total) by mouth 2 (two) times daily with a meal. 04/15/19   Bensimhon, Shaune Pascal, MD  fluticasone (FLONASE) 50 MCG/ACT nasal spray Place 2 sprays into both nostrils daily. 02/23/18    Melynda Ripple, MD  furosemide (LASIX) 20 MG tablet Take 1 tablet (20 mg total) by mouth daily. 12/09/18   Clegg, Amy D, NP  isosorbide-hydrALAZINE (BIDIL) 20-37.5 MG tablet Take 0.5 tablets by mouth 3 (three) times daily.     [provider]  lidocaine-prilocaine (EMLA) cream Apply to portacath site 1-2 hours prior to use 08/04/19   Owens Shark, NP  loratadine (CLARITIN) 10 MG tablet Take 10 mg by mouth daily.    [provider]  losartan (COZAAR) 25 MG tablet TAKE 1 TABLET BY MOUTH EVERY DAY Patient taking differently: Take 25 mg by mouth at bedtime.  05/30/19   Bensimhon, Shaune Pascal, MD  montelukast (SINGULAIR) 10 MG tablet Take 10  mg by mouth at bedtime.    [provider]  polyethylene glycol powder (GLYCOLAX/MIRALAX) powder Take 17 g by mouth daily as needed for mild constipation.    [provider]  prochlorperazine (COMPAZINE) 10 MG tablet Take 1 tablet (10 mg total) by mouth every 6 (six) hours as needed for nausea or vomiting. 08/04/19   Owens Shark, NP  spironolactone (ALDACTONE) 25 MG tablet Take 1 tablet (25 mg total) by mouth daily. 06/01/19 08/30/19  Darrick Grinder D, NP  traMADol (ULTRAM) 50 MG tablet Take 1 tablet (50 mg total) by mouth every 6 (six) hours as needed. 08/02/19 08/01/20  Ralene Ok, MD    Allergies    Lisinopril  Review of Systems   Review of Systems  Respiratory: Positive for shortness of breath.   All other systems reviewed and are negative.   Physical Exam Updated Vital Signs BP (!) 148/79    Pulse 92    Temp 98.8 F (37.1 C) (Oral)    Resp (!) 26    Ht 5\' 11"  (1.803 m)    Wt 64 kg    SpO2 99%    BMI 19.67 kg/m   Physical Exam Vitals and nursing note reviewed.   69 year old male, resting comfortably and in no acute distress. Vital signs are significant for elevated blood pressure and respiratory rate. Oxygen saturation is 99%, which is normal. Head is normocephalic and atraumatic. PERRLA, EOMI. Oropharynx is  clear. Neck is nontender and supple without adenopathy or JVD. Back is nontender and there is no CVA tenderness. Lungs have diminished airflow throughout with moderate expiratory wheezes.  There are no rales or rhonchi. Chest is nontender.  Mediport is present on the left. Heart has regular rate and rhythm without murmur. Abdomen is soft, flat, nontender without masses or hepatosplenomegaly and peristalsis is normoactive. Extremities have no cyanosis or edema, full range of motion is present. Skin is warm and dry without rash. Neurologic: Mental status is normal, cranial nerves are intact, there are no motor or sensory deficits.  ED Results / Procedures / Treatments   Labs (all labs ordered are listed, but only abnormal results are displayed) Labs Reviewed  SARS CORONAVIRUS 2 BY RT PCR (DIASORIN) - Abnormal; Notable for the following components:      Result Value   SARS Coronavirus 2 POSITIVE (*)    All other components within normal limits  CBC - Abnormal; Notable for the following components:   RDW 20.8 (*)    Platelets 139 (*)    All other components within normal limits  BASIC METABOLIC PANEL - Abnormal; Notable for the following components:   CO2 21 (*)    Creatinine, Ser 1.38 (*)    GFR calc non Af Amer 53 (*)    All other components within normal limits  D-DIMER, QUANTITATIVE (NOT AT [facility]) - Abnormal; Notable for the following components:   D-Dimer, Quant 0.99 (*)    All other components within normal limits  TROPONIN I (HIGH SENSITIVITY)  TROPONIN I (HIGH SENSITIVITY)    EKG EKG Interpretation  Date/Time:  Tuesday September 20 2019 21:12:25 EST Ventricular Rate:  93 PR Interval:  140 QRS Duration: 82 QT Interval:  354 QTC Calculation: 440 R Axis:   51 Text Interpretation: Normal sinus rhythm Cannot rule out Anterior infarct , age undetermined Abnormal ECG When compared with ECG of 06/26/2019, No significant change was found Confirmed by Delora Fuel ([id number]) on  09/21/2019 1:24:43 AM   Radiology DG  Chest 2 View  Result Date: 09/20/2019 CLINICAL DATA:  Shortness of breath EXAM: CHEST - 2 VIEW COMPARISON:  1/5/ 2021 FINDINGS: The heart size and mediastinal contours are within normal limits. There is hyperinflation of the lung zones. No airspace consolidation or pleural effusion. A left-sided MediPort catheter seen with the tip at the superior cavoatrial junction. The visualized skeletal structures are unremarkable. IMPRESSION: No active cardiopulmonary disease. Electronically Signed   By: Prudencio Pair M.D.   On: 09/20/2019 21:52   CT Angio Chest PE W and/or Wo Contrast  Result Date: 09/21/2019 CLINICAL DATA:  Short of breath for 3 days, history of colon cancer, positive D dimer EXAM: CT ANGIOGRAPHY CHEST WITH CONTRAST TECHNIQUE: Multidetector CT imaging of the chest was performed using the standard protocol during bolus administration of intravenous contrast. Multiplanar CT image reconstructions and MIPs were obtained to evaluate the vascular anatomy. CONTRAST:  17mL OMNIPAQUE IOHEXOL 350 MG/ML SOLN COMPARISON:  09/20/2019, 06/28/2019 FINDINGS: Cardiovascular: This is a technically adequate evaluation of the pulmonary vasculature. No filling defects or pulmonary emboli. Heart is unremarkable without pericardial effusion. Mild dilatation of the ascending thoracic aorta measuring 3.8 cm, stable. No dissection. Mediastinum/Nodes: No enlarged mediastinal, hilar, or axillary lymph nodes. Thyroid gland, trachea, and esophagus demonstrate no significant findings. Lungs/Pleura: No airspace disease, effusion, or pneumothorax. Central airways are widely patent. Stable 2 mm right lower lobe nodule image 67 of series 5. Upper Abdomen: No acute abnormality. Musculoskeletal: Left chest wall port via subclavian approach tip within the superior vena cava. There are no acute or destructive bony lesions. Reconstructed images demonstrate no additional findings. Review of the MIP  images confirms the above findings. IMPRESSION: 1. No evidence of pulmonary embolus. 2. No acute intrathoracic process. 3. Stable 2 mm right lower lobe pulmonary nodule. Electronically Signed   By: Randa Ngo M.D.   On: 09/21/2019 03:35    Procedures Procedures   Medications Ordered in ED Medications  albuterol (VENTOLIN HFA) 108 (90 Base) MCG/ACT inhaler 8 puff (8 puffs Inhalation Given 09/21/19 0150)  predniSONE (DELTASONE) tablet 60 mg (60 mg Oral Given 09/21/19 0150)  albuterol (VENTOLIN HFA) 108 (90 Base) MCG/ACT inhaler 8 puff (8 puffs Inhalation Given 09/21/19 0317)  iohexol (OMNIPAQUE) 350 MG/ML injection 75 mL (100 mLs Intravenous Contrast Given 09/21/19 0324)    ED Course  I have reviewed the triage vital signs and the nursing notes.  Pertinent labs & imaging results that were available during my care of the patient were reviewed by me and considered in my medical decision making (see chart for details).  MDM Rules/Calculators/A&P Cough and dyspnea consistent with asthma exacerbation, possible respiratory tract infection.  No red flags to suggest COVID-19 infection.  Chest x-ray shows no evidence of pneumonia.  ECG is unchanged from prior.  Labs show mild renal insufficiency and not significantly changed from prior, mild thrombocytopenia probably chemotherapy-induced.  Old records were reviewed confirming recent diagnosis of metastatic colon cancer, currently getting FOLFOX chemotherapy with next infusion due in the morning.  He will be given prednisone and albuterol.  However, with his cancer, I do feel need to rule out pulmonary embolism so will check D-dimer.  He feels slightly better after albuterol inhaler.  On exam, there is improved airflow and decreased wheezing.  He will be given additional albuterol.  D-dimer has come back elevated, and he will be sent for CT angiogram of the chest.  He does have a slightly elevated creatinine, but it is at his baseline.  After  second  round of puffs from albuterol inhaler, he feels that his breathing is back to normal.  On exam, lungs are now completely clear with definitely improved airflow.  CT angiogram shows no evidence of pulmonary embolism or pneumonia.  He is discharged with prescription for prednisone and albuterol inhaler.  Incidental finding, test for COVID-19 was positive.  Patient is given COVID-19 instructions and advised to return for worsening dyspnea.  Joe Garcia was evaluated in Emergency Department on 09/21/2019 for the symptoms described in the history of present illness. He was evaluated in the context of the global COVID-19 pandemic, which necessitated consideration that the patient might be at risk for infection with the SARS-CoV-2 virus that causes COVID-19. Institutional protocols and algorithms that pertain to the evaluation of patients at risk for COVID-19 are in a state of rapid change based on information released by regulatory bodies including the CDC and federal and state organizations. These policies and algorithms were followed during the patient's care in the ED.  Final Clinical Impression(s) / ED Diagnoses Final diagnoses:  COPD exacerbation ([place])  Renal insufficiency  Thrombocytopenia ([place])  COVID-19 virus infection    Rx / DC Orders ED Discharge Orders         Ordered    predniSONE (DELTASONE) 50 MG tablet  Daily     09/21/19 0457    albuterol (VENTOLIN HFA) 108 (90 Base) MCG/ACT inhaler  Every 4 hours PRN     09/21/19 [id number]           Delora Fuel, MD [id number] [phone]

## 2019-09-21 NOTE — Telephone Encounter (Signed)
See orders only encounter.

## 2019-09-21 NOTE — Telephone Encounter (Signed)
Scheduled appt per 2/24 sch message - pt is aware of appts changed

## 2019-09-21 NOTE — Discharge Instructions (Signed)
You did test positive for the coronavirus.  Your asthma flareup tonight was not from the coronavirus, but you may develop problems from it over the next 1-2 weeks.  If you start having worsening shortness of breath, please come to the emergency department for reevaluation.

## 2019-09-21 NOTE — Progress Notes (Signed)
  I connected by phone with Joe Garcia on 09/21/2019 at 8:55 AM to discuss the potential use of an new treatment for mild to moderate COVID-19 viral infection in non-hospitalized patients.  This patient is a 69 y.o. male that meets the FDA criteria for Emergency Use Authorization of bamlanivimab or casirivimab\imdevimab.  Has a (+) direct SARS-CoV-2 viral test result  Has mild or moderate COVID-19   Is ? 69 years of age and weighs ? 40 kg  Is NOT hospitalized due to COVID-19  Is NOT requiring oxygen therapy or requiring an increase in baseline oxygen flow rate due to COVID-19  Is within 10 days of symptom onset  Has at least one of the high risk factor(s) for progression to severe COVID-19 and/or hospitalization as defined in EUA.  Specific high risk criteria : Immunosuppressive Disease, on active chemotherapy, age, CKD, heart disease, lung disease, CAD  Symptom onset 4 days ago   I have spoken and communicated the following to the patient or parent/caregiver:  1. FDA has authorized the emergency use of bamlanivimab and casirivimab\imdevimab for the treatment of mild to moderate COVID-19 in adults and pediatric patients with positive results of direct SARS-CoV-2 viral testing who are 13 years of age and older weighing at least 40 kg, and who are at high risk for progressing to severe COVID-19 and/or hospitalization.  2. The significant known and potential risks and benefits of bamlanivimab and casirivimab\imdevimab, and the extent to which such potential risks and benefits are unknown.  3. Information on available alternative treatments and the risks and benefits of those alternatives, including clinical trials.  4. Patients treated with bamlanivimab and casirivimab\imdevimab should continue to self-isolate and use infection control measures (e.g., wear mask, isolate, social distance, avoid sharing personal items, clean and disinfect "high touch" surfaces, and frequent  handwashing) according to CDC guidelines.   5. The patient or parent/caregiver has the option to accept or refuse bamlanivimab or casirivimab\imdevimab .  After reviewing this information with the patient, The patient agreed to proceed with receiving the bamlanimivab infusion and will be provided a copy of the Fact sheet prior to receiving the infusion.Scot Dock 09/21/2019 8:55 AM

## 2019-09-21 NOTE — Progress Notes (Signed)
Pt called me to report he is tested COVID+ and he will not be able to keep our sch home visit for next week. He asked for assistance for picking up his meds from pharmacy so I went by to pick them up, it was carvedilol, losartan, lasix and the prednisone. I delivered them and hung the bag on his doorknob, I stepped back to ensure he came to door to get the rx's.   He said he felt ok, no major issues or complaints at this time. Feeling ok. Will check in next week.   Marylouise Stacks, EMT-Paramedic  09/21/19

## 2019-09-21 NOTE — ED Notes (Signed)
Discharge instructions and prescriptions discussed with Pt. Pt verbalized understanding. Pt stable and ambulatory.   

## 2019-09-22 ENCOUNTER — Ambulatory Visit (HOSPITAL_COMMUNITY)
Admission: RE | Admit: 2019-09-22 | Discharge: 2019-09-22 | Disposition: A | Discharge status: Home / Self Care | Payer: Medicare Other | Source: Ambulatory Visit | Attending: Pulmonary Disease | Admitting: Pulmonary Disease

## 2019-09-22 DIAGNOSIS — Z23 Encounter for immunization: Secondary | ICD-10-CM | POA: Diagnosis not present

## 2019-09-22 DIAGNOSIS — U071 COVID-19: Secondary | ICD-10-CM

## 2019-09-22 MED ORDER — DIPHENHYDRAMINE HCL 50 MG/ML IJ SOLN: 50.0000 mg | Freq: Once | INTRAMUSCULAR | Status: DC | PRN

## 2019-09-22 MED ORDER — SODIUM CHLORIDE 0.9 % IV SOLN
INTRAVENOUS | Status: DC | PRN
  Administered 2019-09-22: 250 mL via INTRAVENOUS

## 2019-09-22 MED ORDER — FAMOTIDINE IN NACL 20-0.9 MG/50ML-% IV SOLN: 20.0000 mg | Freq: Once | INTRAVENOUS | Status: DC | PRN

## 2019-09-22 MED ORDER — SODIUM CHLORIDE 0.9 % IV SOLN
700.0000 mg | Freq: Once | INTRAVENOUS | Status: AC
  Administered 2019-09-22: 700 mg via INTRAVENOUS
  Filled 2019-09-22: qty 20

## 2019-09-22 MED ORDER — METHYLPREDNISOLONE SODIUM SUCC 125 MG IJ SOLR: 125.0000 mg | Freq: Once | INTRAMUSCULAR | Status: DC | PRN

## 2019-09-22 MED ORDER — ALBUTEROL SULFATE HFA 108 (90 BASE) MCG/ACT IN AERS: 2.0000 | INHALATION_SPRAY | Freq: Once | RESPIRATORY_TRACT | Status: DC | PRN

## 2019-09-22 MED ORDER — EPINEPHRINE 0.3 MG/0.3ML IJ SOAJ: 0.3000 mg | Freq: Once | INTRAMUSCULAR | Status: DC | PRN

## 2019-09-22 NOTE — Discharge Instructions (Signed)

## 2019-09-22 NOTE — Progress Notes (Signed)
  Diagnosis: COVID-19  Physician: Dr. Asencion Noble  Procedure: [facility] Infusion Clinic Med: bamlanivimab infusion - Provided patient with bamlanimivab fact sheet for patients, parents and caregivers prior to infusion.  Complications: No immediate complications noted.  Discharge: Discharged home   Joe Garcia 09/22/2019

## 2019-09-23 ENCOUNTER — Inpatient Hospital Stay: Payer: Medicare Other

## 2019-09-29 ENCOUNTER — Ambulatory Visit: Payer: Medicare Other | Admitting: Oncology

## 2019-09-29 ENCOUNTER — Ambulatory Visit: Payer: Medicare Other

## 2019-09-29 ENCOUNTER — Other Ambulatory Visit: Payer: Medicare Other

## 2019-09-29 ENCOUNTER — Encounter: Payer: Medicare Other | Admitting: Nutrition

## 2019-10-05 ENCOUNTER — Ambulatory Visit: Payer: Medicare Other | Admitting: Oncology

## 2019-10-05 ENCOUNTER — Other Ambulatory Visit: Payer: Medicare Other

## 2019-10-05 ENCOUNTER — Encounter: Payer: Medicare Other | Admitting: Nutrition

## 2019-10-05 ENCOUNTER — Ambulatory Visit: Payer: Medicare Other

## 2019-10-10 ENCOUNTER — Telehealth (HOSPITAL_COMMUNITY): Payer: Self-pay

## 2019-10-11 NOTE — Telephone Encounter (Signed)
Called pt to f/u for visit this week. No answer.   Marylouise Stacks, EMT-Paramedic  10/11/19

## 2019-10-12 ENCOUNTER — Inpatient Hospital Stay (HOSPITAL_BASED_OUTPATIENT_CLINIC_OR_DEPARTMENT_OTHER): Payer: Medicare Other | Admitting: Oncology

## 2019-10-12 ENCOUNTER — Inpatient Hospital Stay: Payer: Medicare Other | Attending: Nurse Practitioner

## 2019-10-12 ENCOUNTER — Inpatient Hospital Stay: Payer: Medicare Other

## 2019-10-12 ENCOUNTER — Other Ambulatory Visit: Payer: Self-pay

## 2019-10-12 ENCOUNTER — Inpatient Hospital Stay: Payer: Medicare Other | Admitting: Nutrition

## 2019-10-12 VITALS — BP 140/72 | HR 59 | Temp 98.0°F | Resp 20 | Ht 71.0 in | Wt 156.5 lb

## 2019-10-12 DIAGNOSIS — D509 Iron deficiency anemia, unspecified: Secondary | ICD-10-CM | POA: Diagnosis not present

## 2019-10-12 DIAGNOSIS — I5022 Chronic systolic (congestive) heart failure: Secondary | ICD-10-CM | POA: Insufficient documentation

## 2019-10-12 DIAGNOSIS — N261 Atrophy of kidney (terminal): Secondary | ICD-10-CM | POA: Diagnosis not present

## 2019-10-12 DIAGNOSIS — R911 Solitary pulmonary nodule: Secondary | ICD-10-CM | POA: Insufficient documentation

## 2019-10-12 DIAGNOSIS — C184 Malignant neoplasm of transverse colon: Secondary | ICD-10-CM

## 2019-10-12 DIAGNOSIS — Z95828 Presence of other vascular implants and grafts: Secondary | ICD-10-CM

## 2019-10-12 DIAGNOSIS — D649 Anemia, unspecified: Secondary | ICD-10-CM | POA: Diagnosis not present

## 2019-10-12 DIAGNOSIS — Z8616 Personal history of COVID-19: Secondary | ICD-10-CM | POA: Insufficient documentation

## 2019-10-12 DIAGNOSIS — J45909 Unspecified asthma, uncomplicated: Secondary | ICD-10-CM | POA: Insufficient documentation

## 2019-10-12 DIAGNOSIS — Z5189 Encounter for other specified aftercare: Secondary | ICD-10-CM | POA: Diagnosis not present

## 2019-10-12 DIAGNOSIS — Z9221 Personal history of antineoplastic chemotherapy: Secondary | ICD-10-CM | POA: Diagnosis not present

## 2019-10-12 DIAGNOSIS — Z79899 Other long term (current) drug therapy: Secondary | ICD-10-CM | POA: Diagnosis not present

## 2019-10-12 DIAGNOSIS — Z5111 Encounter for antineoplastic chemotherapy: Secondary | ICD-10-CM | POA: Insufficient documentation

## 2019-10-12 DIAGNOSIS — R197 Diarrhea, unspecified: Secondary | ICD-10-CM | POA: Insufficient documentation

## 2019-10-12 LAB — CMP (CANCER CENTER ONLY)
ALT: 15 U/L (ref 0–44)
AST: 11 U/L — ABNORMAL LOW (ref 15–41)
Albumin: 3.4 g/dL — ABNORMAL LOW (ref 3.5–5.0)
Alkaline Phosphatase: 77 U/L (ref 38–126)
Anion gap: 8 (ref 5–15)
BUN: 21 mg/dL (ref 8–23)
CO2: 25 mmol/L (ref 22–32)
Calcium: 8.7 mg/dL — ABNORMAL LOW (ref 8.9–10.3)
Chloride: 109 mmol/L (ref 98–111)
Creatinine: 1.38 mg/dL — ABNORMAL HIGH (ref 0.61–1.24)
GFR, Est AFR Am: >60 mL/min (ref >60)
GFR, Estimated: 53 mL/min — ABNORMAL LOW (ref >60)
Glucose, Bld: 96 mg/dL (ref 70–99)
Potassium: 3.9 mmol/L (ref 3.5–5.1)
Sodium: 142 mmol/L (ref 135–145)
Total Bilirubin: 0.6 mg/dL (ref 0.3–1.2)
Total Protein: 6.4 g/dL — ABNORMAL LOW (ref 6.5–8.1)

## 2019-10-12 LAB — CBC WITH DIFFERENTIAL (CANCER CENTER ONLY)
Abs Immature Granulocytes: 0.02 10*3/uL (ref 0.00–0.07)
Basophils Absolute: 0 10*3/uL (ref 0.0–0.1)
Basophils Relative: 0 %
Eosinophils Absolute: 0.4 10*3/uL (ref 0.0–0.5)
Eosinophils Relative: 7 %
HCT: 34.1 % — ABNORMAL LOW (ref 39.0–52.0)
Hemoglobin: 11.1 g/dL — ABNORMAL LOW (ref 13.0–17.0)
Immature Granulocytes: 0 %
Lymphocytes Relative: 23 %
Lymphs Abs: 1.3 10*3/uL (ref 0.7–4.0)
MCH: 27.4 pg (ref 26.0–34.0)
MCHC: 32.6 g/dL (ref 30.0–36.0)
MCV: 84.2 fL (ref 80.0–100.0)
Monocytes Absolute: 0.6 10*3/uL (ref 0.1–1.0)
Monocytes Relative: 10 %
Neutro Abs: 3.4 10*3/uL (ref 1.7–7.7)
Neutrophils Relative %: 60 %
Platelet Count: 185 10*3/uL (ref 150–400)
RBC: 4.05 MIL/uL — ABNORMAL LOW (ref 4.22–5.81)
RDW: 18.6 % — ABNORMAL HIGH (ref 11.5–15.5)
WBC Count: 5.6 10*3/uL (ref 4.0–10.5)
nRBC: 0 % (ref 0.0–0.2)

## 2019-10-12 MED ORDER — DEXAMETHASONE SODIUM PHOSPHATE 10 MG/ML IJ SOLN
10.0000 mg | Freq: Once | INTRAMUSCULAR | Status: AC
  Administered 2019-10-12: 10 mg via INTRAVENOUS

## 2019-10-12 MED ORDER — DEXAMETHASONE SODIUM PHOSPHATE 10 MG/ML IJ SOLN
INTRAMUSCULAR | Status: AC
  Filled 2019-10-12: qty 1

## 2019-10-12 MED ORDER — DEXTROSE 5 % IV SOLN
Freq: Once | INTRAVENOUS | Status: AC
  Filled 2019-10-12: qty 250

## 2019-10-12 MED ORDER — SODIUM CHLORIDE 0.9 % IV SOLN
2400.0000 mg/m2 | INTRAVENOUS | Status: DC
  Administered 2019-10-12: 4400 mg via INTRAVENOUS
  Filled 2019-10-12: qty 88

## 2019-10-12 MED ORDER — FLUOROURACIL CHEMO INJECTION 2.5 GM/50ML
400.0000 mg/m2 | Freq: Once | INTRAVENOUS | Status: AC
  Administered 2019-10-12: 750 mg via INTRAVENOUS
  Filled 2019-10-12: qty 15

## 2019-10-12 MED ORDER — PALONOSETRON HCL INJECTION 0.25 MG/5ML
0.2500 mg | Freq: Once | INTRAVENOUS | Status: AC
  Administered 2019-10-12: 0.25 mg via INTRAVENOUS

## 2019-10-12 MED ORDER — OXALIPLATIN CHEMO INJECTION 100 MG/20ML
65.0000 mg/m2 | Freq: Once | INTRAVENOUS | Status: AC
  Administered 2019-10-12: 120 mg via INTRAVENOUS
  Filled 2019-10-12: qty 10

## 2019-10-12 MED ORDER — SODIUM CHLORIDE 0.9% FLUSH
10.0000 mL | Freq: Once | INTRAVENOUS | Status: AC
  Administered 2019-10-12: 10 mL
  Filled 2019-10-12: qty 10

## 2019-10-12 MED ORDER — PALONOSETRON HCL INJECTION 0.25 MG/5ML
INTRAVENOUS | Status: AC
  Filled 2019-10-12: qty 5

## 2019-10-12 MED ORDER — LEUCOVORIN CALCIUM INJECTION 350 MG
400.0000 mg/m2 | Freq: Once | INTRAVENOUS | Status: AC
  Administered 2019-10-12: 732 mg via INTRAVENOUS
  Filled 2019-10-12: qty 36.6

## 2019-10-12 NOTE — Patient Instructions (Signed)
[facility] Discharge Instructions for Patients Receiving Chemotherapy  Today you received the following chemotherapy agents: Oxaliplatin (Eloxatin), Leucovorin, Fluorouracil (Adrucil, 5-FU)  To help prevent nausea and vomiting after your treatment, we encourage you to take your nausea medication as directed.   If you develop nausea and vomiting that is not controlled by your nausea medication, call the clinic.   BELOW ARE SYMPTOMS THAT SHOULD BE REPORTED IMMEDIATELY:  *FEVER GREATER THAN 100.5 F  *CHILLS WITH OR WITHOUT FEVER  NAUSEA AND VOMITING THAT IS NOT CONTROLLED WITH YOUR NAUSEA MEDICATION  *UNUSUAL SHORTNESS OF BREATH  *UNUSUAL BRUISING OR BLEEDING  TENDERNESS IN MOUTH AND THROAT WITH OR WITHOUT PRESENCE OF ULCERS  *URINARY PROBLEMS  *BOWEL PROBLEMS  UNUSUAL RASH Items with * indicate a potential emergency and should be followed up as soon as possible.  Feel free to call the clinic should you have any questions or concerns. The clinic phone number is [phone].  Please show the CHEMO ALERT CARD at check-in to the Emergency Department and triage nurse.  Coronavirus (COVID-19) Are you at risk?  Are you at risk for the Coronavirus (COVID-19)?  To be considered HIGH RISK for Coronavirus (COVID-19), you have to meet the following criteria:  . Traveled to [place], [place], [place], [place] or [place]; or in the [place] to [place], [place], [place], or [place]; and have fever, cough, and shortness of breath within the last 2 weeks of travel OR . Been in close contact with a person diagnosed with COVID-19 within the last 2 weeks and have fever, cough, and shortness of breath . IF YOU DO NOT MEET THESE CRITERIA, YOU ARE CONSIDERED LOW RISK FOR COVID-19.  What to do if you are HIGH RISK for COVID-19?  . If you are having a medical emergency, call 911. . Seek medical care right away. Before you go to a doctor's office, urgent care or  emergency department, call ahead and tell them about your recent travel, contact with someone diagnosed with COVID-19, and your symptoms. You should receive instructions from your physician's office regarding next steps of care.  . When you arrive at healthcare provider, tell the healthcare staff immediately you have returned from visiting [place], [place], [place], [place] or [place]; or traveled in the [place] to [place], [place], [place], or [place]; in the last two weeks or you have been in close contact with a person diagnosed with COVID-19 in the last 2 weeks.   . Tell the health care staff about your symptoms: fever, cough and shortness of breath. . After you have been seen by a medical provider, you will be either: o Tested for (COVID-19) and discharged home on quarantine except to seek medical care if symptoms worsen, and asked to  - Stay home and avoid contact with others until you get your results (4-5 days)  - Avoid travel on public transportation if possible (such as bus, train, or airplane) or o Sent to the Emergency Department by EMS for evaluation, COVID-19 testing, and possible admission depending on your condition and test results.  What to do if you are LOW RISK for COVID-19?  Reduce your risk of any infection by using the same precautions used for avoiding the common cold or flu:  . Wash your hands often with soap and warm water for at least 20 seconds.  If soap and water are not readily available, use an alcohol-based hand sanitizer with at least 60% alcohol.  .   If coughing or sneezing, cover your mouth and nose by coughing or sneezing into the elbow areas of your shirt or coat, into a tissue or into your sleeve (not your hands). . Avoid shaking hands with others and consider head nods or verbal greetings only. . Avoid touching your eyes, nose, or mouth with unwashed hands.  . Avoid close contact with people who are sick. . Avoid places or events with large numbers  of people in one location, like concerts or sporting events. . Carefully consider travel plans you have or are making. . If you are planning any travel outside or inside the [place], visit the CDC's Travelers' Health webpage for the latest health notices. . If you have some symptoms but not all symptoms, continue to monitor at home and seek medical attention if your symptoms worsen. . If you are having a medical emergency, call 911.   [place] / e-Visit: [email or web address]         [facility]: [facility]: [phone]                   [facility]: [phone]

## 2019-10-12 NOTE — Progress Notes (Signed)
  [facility] OFFICE PROGRESS NOTE   Diagnosis: Colon cancer  INTERVAL HISTORY:   Joe Garcia was last treated with FOLFOX on 08/31/2019.  He reports mouth soreness following chemotherapy.  He has tenderness at the Port-A-Cath site.  No fever. He was diagnosed with COVID-19 infection on 09/21/2019.  He was treated with antibody therapy and has recovered.  No peripheral neuropathy symptoms.  He has noted dryness and hyperpigmentation of the hands.  Objective:  Vital signs in last 24 hours:  Blood pressure 140/72, pulse (!) 59, temperature 98 F (36.7 C), temperature source Temporal, resp. rate 20, height '5\' 11"'$  (1.803 m), weight 156 lb 8 oz (71 kg), SpO2 100 %.    HEENT: No thrush or ulcers Resp: Lungs clear bilaterally Cardio: Regular rate and rhythm GI: Nontender, no hepatomegaly Vascular: No leg edema  Skin: Mild hyperpigmentation and dryness of the hands  Portacath/PICC-without erythema  Lab Results:  Lab Results  Component Value Date   WBC 5.6 10/12/2019   HGB 11.1 (L) 10/12/2019   HCT 34.1 (L) 10/12/2019   MCV 84.2 10/12/2019   PLT 185 10/12/2019   NEUTROABS 3.4 10/12/2019    CMP  Lab Results  Component Value Date   NA 142 10/12/2019   K 3.9 10/12/2019   CL 109 10/12/2019   CO2 25 10/12/2019   GLUCOSE 96 10/12/2019   BUN 21 10/12/2019   CREATININE 1.38 (H) 10/12/2019   CALCIUM 8.7 (L) 10/12/2019   PROT 6.4 (L) 10/12/2019   ALBUMIN 3.4 (L) 10/12/2019   AST 11 (L) 10/12/2019   ALT 15 10/12/2019   ALKPHOS 77 10/12/2019   BILITOT 0.6 10/12/2019   GFRNONAA 53 (L) 10/12/2019   GFRAA >60 10/12/2019    Lab Results  Component Value Date   CEA1 1.4 06/27/2019    Medications: I have reviewed the patient's current medications.   Assessment/Plan: 1. 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 Grade 3, lymphovascular and perineural invasion present, 4/18 lymph nodes positive,  positive lymph nodes mesenteric margin, tumor invades into adherent omentum, MSI high,loss of MLH1 and PMS2 expression ? CT abdomen/pelvis 06/26/2019-thickening within the distal transverse colon, severely atrophic and calcified right kidney ? CT chest 06/28/2019-no evidence of metastatic disease, stable 3 mm right lower lobe nodule ? Cycle 1 FOLFOX 08/04/2019 (oxaliplatin 65 mg/m due to renal function) ? Plan for repeat CTs after he has completed 5 cycles of chemotherapy ? Cycle 2 FOLFOX 08/18/2019 ? Cycle 3 FOLFOX 08/31/2019, Udenyca added ? Cycle 4 FOLFOX 10/12/2019 2. Asthma 3. 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 Microcytic anemia, likely iron deficiency anemia secondary to #1 7. Port-A-Cath placement on 12/15/2019, Dr. Rosendo Gros 8. COVID-19 infection 09/21/2019-treated with bamlanivimab    Disposition: Joe Garcia appears well.  He has completed 3 cycles of FOLFOX.  Cycle 4 has been delayed secondary to a family death and diagnosis of COVID-19.  He appears stable to proceed with cycle 4 FOLFOX today.  He will call for a fever, erythema, or drainage at the Port-A-Cath site.  Joe Garcia will return for an office visit and cycle 5 FOLFOX in 2 weeks.  Betsy Coder, MD  10/12/2019  11:04 AM

## 2019-10-12 NOTE — Progress Notes (Signed)
Nutrition follow-up completed with patient during infusion for colon cancer. Patient reports his appetite is good. Weight improved and documented as 156.5 pounds March 17 increased from 141 pounds February 23. Patient requesting additional Ensure Enlive. He is denying nutrition impact symptoms.  Nutrition diagnosis: Unintended weight loss improved.  Intervention: Educated patient to continue strategies for increased calories and protein. Provided 1 complementary case of Ensure Enlive. Teach back method used.  Monitoring, evaluation, goals: Patient will tolerate increased calories and protein to minimize weight loss.  Next visit: To be scheduled as needed.  **Disclaimer: This note was dictated with voice recognition software. Similar sounding words can inadvertently be transcribed and this note may contain transcription errors which may not have been corrected upon publication of note.**

## 2019-10-14 ENCOUNTER — Other Ambulatory Visit: Payer: Self-pay

## 2019-10-14 ENCOUNTER — Telehealth: Payer: Self-pay | Admitting: Oncology

## 2019-10-14 ENCOUNTER — Ambulatory Visit: Payer: Medicare Other

## 2019-10-14 ENCOUNTER — Inpatient Hospital Stay: Payer: Medicare Other

## 2019-10-14 VITALS — BP 124/75 | HR 58 | Temp 98.7°F | Resp 20

## 2019-10-14 DIAGNOSIS — Z5111 Encounter for antineoplastic chemotherapy: Secondary | ICD-10-CM | POA: Diagnosis not present

## 2019-10-14 DIAGNOSIS — C184 Malignant neoplasm of transverse colon: Secondary | ICD-10-CM

## 2019-10-14 MED ORDER — SODIUM CHLORIDE 0.9% FLUSH
10.0000 mL | INTRAVENOUS | Status: DC | PRN
  Administered 2019-10-14: 10 mL
  Filled 2019-10-14: qty 10

## 2019-10-14 MED ORDER — HEPARIN SOD (PORK) LOCK FLUSH 100 UNIT/ML IV SOLN
500.0000 [IU] | Freq: Once | INTRAVENOUS | Status: AC | PRN
  Administered 2019-10-14: 500 [IU]
  Filled 2019-10-14: qty 5

## 2019-10-14 MED ORDER — PEGFILGRASTIM-CBQV 6 MG/0.6ML ~~LOC~~ SOSY
6.0000 mg | PREFILLED_SYRINGE | Freq: Once | SUBCUTANEOUS | Status: AC
  Administered 2019-10-14: 6 mg via SUBCUTANEOUS

## 2019-10-14 MED ORDER — PEGFILGRASTIM-CBQV 6 MG/0.6ML ~~LOC~~ SOSY
PREFILLED_SYRINGE | SUBCUTANEOUS | Status: AC
  Filled 2019-10-14: qty 0.6

## 2019-10-14 NOTE — Patient Instructions (Signed)

## 2019-10-14 NOTE — Telephone Encounter (Signed)
Scheduled per los. Called and spoke with patient. Confirmed appt 

## 2019-10-17 ENCOUNTER — Ambulatory Visit: Payer: Medicare Other | Attending: Internal Medicine

## 2019-10-17 DIAGNOSIS — Z20822 Contact with and (suspected) exposure to covid-19: Secondary | ICD-10-CM

## 2019-10-18 ENCOUNTER — Telehealth (HOSPITAL_COMMUNITY): Payer: Self-pay

## 2019-10-18 LAB — SARS-COV-2, NAA 2 DAY TAT

## 2019-10-18 LAB — NOVEL CORONAVIRUS, NAA: SARS-CoV-2, NAA: NOT DETECTED

## 2019-10-18 NOTE — Telephone Encounter (Signed)
I called pt in regards about home visit this week, he reports he had another COVID test done yesterday and is still awaiting results-depending on those results is if we can have a visit or not this week.  He is suppose to call me back once he finds out.   Marylouise Stacks, EMT-Paramedic  10/18/19

## 2019-10-19 NOTE — Progress Notes (Signed)

## 2019-10-21 ENCOUNTER — Other Ambulatory Visit (HOSPITAL_COMMUNITY): Payer: Self-pay | Admitting: Adult Health

## 2019-10-23 ENCOUNTER — Other Ambulatory Visit: Payer: Self-pay | Admitting: Oncology

## 2019-10-25 ENCOUNTER — Inpatient Hospital Stay (HOSPITAL_BASED_OUTPATIENT_CLINIC_OR_DEPARTMENT_OTHER): Payer: Medicare Other | Admitting: Oncology

## 2019-10-25 ENCOUNTER — Inpatient Hospital Stay: Payer: Medicare Other

## 2019-10-25 ENCOUNTER — Other Ambulatory Visit: Payer: Self-pay

## 2019-10-25 VITALS — BP 143/82 | HR 64 | Temp 98.5°F | Resp 17 | Ht 71.0 in | Wt 159.5 lb

## 2019-10-25 DIAGNOSIS — Z95828 Presence of other vascular implants and grafts: Secondary | ICD-10-CM

## 2019-10-25 DIAGNOSIS — C184 Malignant neoplasm of transverse colon: Secondary | ICD-10-CM

## 2019-10-25 DIAGNOSIS — Z5111 Encounter for antineoplastic chemotherapy: Secondary | ICD-10-CM | POA: Diagnosis not present

## 2019-10-25 LAB — CMP (CANCER CENTER ONLY)
ALT: 16 U/L (ref 0–44)
AST: 18 U/L (ref 15–41)
Albumin: 3.5 g/dL (ref 3.5–5.0)
Alkaline Phosphatase: 110 U/L (ref 38–126)
Anion gap: 10 (ref 5–15)
BUN: 11 mg/dL (ref 8–23)
CO2: 24 mmol/L (ref 22–32)
Calcium: 9 mg/dL (ref 8.9–10.3)
Chloride: 107 mmol/L (ref 98–111)
Creatinine: 1.24 mg/dL (ref 0.61–1.24)
GFR, Est AFR Am: >60 mL/min (ref >60)
GFR, Estimated: >60 mL/min (ref >60)
Glucose, Bld: 98 mg/dL (ref 70–99)
Potassium: 3.8 mmol/L (ref 3.5–5.1)
Sodium: 141 mmol/L (ref 135–145)
Total Bilirubin: 0.3 mg/dL (ref 0.3–1.2)
Total Protein: 6.6 g/dL (ref 6.5–8.1)

## 2019-10-25 LAB — CBC WITH DIFFERENTIAL (CANCER CENTER ONLY)
Abs Immature Granulocytes: 0.06 10*3/uL (ref 0.00–0.07)
Basophils Absolute: 0 10*3/uL (ref 0.0–0.1)
Basophils Relative: 1 %
Eosinophils Absolute: 0.2 10*3/uL (ref 0.0–0.5)
Eosinophils Relative: 3 %
HCT: 36.6 % — ABNORMAL LOW (ref 39.0–52.0)
Hemoglobin: 11.9 g/dL — ABNORMAL LOW (ref 13.0–17.0)
Immature Granulocytes: 1 %
Lymphocytes Relative: 31 %
Lymphs Abs: 2.2 10*3/uL (ref 0.7–4.0)
MCH: 28.2 pg (ref 26.0–34.0)
MCHC: 32.5 g/dL (ref 30.0–36.0)
MCV: 86.7 fL (ref 80.0–100.0)
Monocytes Absolute: 0.4 10*3/uL (ref 0.1–1.0)
Monocytes Relative: 6 %
Neutro Abs: 4.1 10*3/uL (ref 1.7–7.7)
Neutrophils Relative %: 58 %
Platelet Count: 161 10*3/uL (ref 150–400)
RBC: 4.22 MIL/uL (ref 4.22–5.81)
RDW: 17.4 % — ABNORMAL HIGH (ref 11.5–15.5)
WBC Count: 7 10*3/uL (ref 4.0–10.5)
nRBC: 0 % (ref 0.0–0.2)

## 2019-10-25 MED ORDER — LEUCOVORIN CALCIUM INJECTION 350 MG
400.0000 mg/m2 | Freq: Once | INTRAVENOUS | Status: AC
  Administered 2019-10-25: 13:00:00 732 mg via INTRAVENOUS
  Filled 2019-10-25: qty 36.6

## 2019-10-25 MED ORDER — SODIUM CHLORIDE 0.9 % IV SOLN
2400.0000 mg/m2 | INTRAVENOUS | Status: DC
  Administered 2019-10-25: 15:00:00 4400 mg via INTRAVENOUS
  Filled 2019-10-25: qty 88

## 2019-10-25 MED ORDER — DEXAMETHASONE SODIUM PHOSPHATE 10 MG/ML IJ SOLN
INTRAMUSCULAR | Status: AC
  Filled 2019-10-25: qty 1

## 2019-10-25 MED ORDER — DEXTROSE 5 % IV SOLN
Freq: Once | INTRAVENOUS | Status: AC
  Filled 2019-10-25: qty 250

## 2019-10-25 MED ORDER — PALONOSETRON HCL INJECTION 0.25 MG/5ML
INTRAVENOUS | Status: AC
  Filled 2019-10-25: qty 5

## 2019-10-25 MED ORDER — PALONOSETRON HCL INJECTION 0.25 MG/5ML
0.2500 mg | Freq: Once | INTRAVENOUS | Status: AC
  Administered 2019-10-25: 12:00:00 0.25 mg via INTRAVENOUS

## 2019-10-25 MED ORDER — DEXAMETHASONE SODIUM PHOSPHATE 10 MG/ML IJ SOLN
10.0000 mg | Freq: Once | INTRAMUSCULAR | Status: AC
  Administered 2019-10-25: 12:00:00 10 mg via INTRAVENOUS

## 2019-10-25 MED ORDER — FLUOROURACIL CHEMO INJECTION 2.5 GM/50ML
400.0000 mg/m2 | Freq: Once | INTRAVENOUS | Status: AC
  Administered 2019-10-25: 750 mg via INTRAVENOUS
  Filled 2019-10-25: qty 15

## 2019-10-25 MED ORDER — SODIUM CHLORIDE 0.9% FLUSH
10.0000 mL | Freq: Once | INTRAVENOUS | Status: AC
  Administered 2019-10-25: 10 mL
  Filled 2019-10-25: qty 10

## 2019-10-25 MED ORDER — OXALIPLATIN CHEMO INJECTION 100 MG/20ML
65.0000 mg/m2 | Freq: Once | INTRAVENOUS | Status: AC
  Administered 2019-10-25: 13:00:00 120 mg via INTRAVENOUS
  Filled 2019-10-25: qty 24

## 2019-10-25 NOTE — Progress Notes (Signed)
  [facility] OFFICE PROGRESS NOTE   Diagnosis: Colon cancer  INTERVAL HISTORY:   Joe Garcia returns as scheduled.  He completed another cycle of FOLFOX on 10/12/2019.  No nausea/vomiting.  He reports mouth soreness without discrete ulcers.  This has resolved.  He has intermittent diarrhea.  No peripheral numbness.  He has cold sensitivity for 1 day following chemotherapy.  Objective:  Vital signs in last 24 hours:  Blood pressure (!) 143/82, pulse 64, temperature 98.5 F (36.9 C), temperature source Temporal, resp. rate 17, height '5\' 11"'$  (1.803 m), weight 159 lb 8 oz (72.3 kg), SpO2 100 %.    HEENT: No thrush or ulcers Resp: Distant breath sounds with mild end expiratory wheezes bilaterally, no respiratory distress Cardio: Regular rate and rhythm GI: No hepatosplenomegaly, no mass, nontender Vascular: No leg edema Neuro: Mild loss of vibratory sense of the fingertips bilaterally   Portacath/PICC-without erythema  Lab Results:  Lab Results  Component Value Date   WBC 7.0 10/25/2019   HGB 11.9 (L) 10/25/2019   HCT 36.6 (L) 10/25/2019   MCV 86.7 10/25/2019   PLT 161 10/25/2019   NEUTROABS 4.1 10/25/2019    CMP  Lab Results  Component Value Date   NA 141 10/25/2019   K 3.8 10/25/2019   CL 107 10/25/2019   CO2 24 10/25/2019   GLUCOSE 98 10/25/2019   BUN 11 10/25/2019   CREATININE 1.24 10/25/2019   CALCIUM 9.0 10/25/2019   PROT 6.6 10/25/2019   ALBUMIN 3.5 10/25/2019   AST 18 10/25/2019   ALT 16 10/25/2019   ALKPHOS 110 10/25/2019   BILITOT 0.3 10/25/2019   GFRNONAA >60 10/25/2019   GFRAA >60 10/25/2019    Lab Results  Component Value Date   CEA1 1.4 06/27/2019    Medications: I have reviewed the patient's current medications.   Assessment/Plan: 1. 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 Grade 3, lymphovascular and perineural invasion present, 4/18 lymph nodes positive,  positive lymph nodes mesenteric margin, tumor invades into adherent omentum, MSI high,loss of MLH1 and PMS2 expression ? CT abdomen/pelvis 06/26/2019-thickening within the distal transverse colon, severely atrophic and calcified right kidney ? CT chest 06/28/2019-no evidence of metastatic disease, stable 3 mm right lower lobe nodule ? Cycle 1 FOLFOX 08/04/2019 (oxaliplatin 65 mg/m due to renal function) ? Plan for repeat CTs after he has completed 5 cycles of chemotherapy ? Cycle 2 FOLFOX 08/18/2019 ? Cycle 3 FOLFOX 08/31/2019, Udenyca added ? Cycle 4 FOLFOX 10/12/2019 ? Cycle 5 FOLFOX 10/25/2019 ? Stable right lower lobe nodule on chest CT 09/21/2019 2. Asthma 3. 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 Microcytic anemia, likely iron deficiency anemia secondary to #1 7. Port-A-Cath placement on 12/15/2019, Dr. Rosendo Gros 8. COVID-19 infection 09/21/2019-treated with bamlanivimab   Disposition: [name] has completed 4 cycles of FOLFOX.  He has tolerated the chemotherapy well.  He will complete cycle 5 today.  He will return for an office visit and chemotherapy in 2 weeks.  Betsy Coder, MD  10/25/2019  11:57 AM

## 2019-10-25 NOTE — Patient Instructions (Signed)
[facility] Discharge Instructions for Patients Receiving Chemotherapy  Today you received the following chemotherapy agents: Oxaliplatin (Eloxatin), Leucovorin, Fluorouracil (Adrucil, 5-FU)  To help prevent nausea and vomiting after your treatment, we encourage you to take your nausea medication as directed.   If you develop nausea and vomiting that is not controlled by your nausea medication, call the clinic.   BELOW ARE SYMPTOMS THAT SHOULD BE REPORTED IMMEDIATELY:  *FEVER GREATER THAN 100.5 F  *CHILLS WITH OR WITHOUT FEVER  NAUSEA AND VOMITING THAT IS NOT CONTROLLED WITH YOUR NAUSEA MEDICATION  *UNUSUAL SHORTNESS OF BREATH  *UNUSUAL BRUISING OR BLEEDING  TENDERNESS IN MOUTH AND THROAT WITH OR WITHOUT PRESENCE OF ULCERS  *URINARY PROBLEMS  *BOWEL PROBLEMS  UNUSUAL RASH Items with * indicate a potential emergency and should be followed up as soon as possible.  Feel free to call the clinic should you have any questions or concerns. The clinic phone number is [phone].  Please show the CHEMO ALERT CARD at check-in to the Emergency Department and triage nurse.  Coronavirus (COVID-19) Are you at risk?  Are you at risk for the Coronavirus (COVID-19)?  To be considered HIGH RISK for Coronavirus (COVID-19), you have to meet the following criteria:  . Traveled to [place], [place], [place], [place] or [place]; or in the [place] to [place], [place], [place], or [place]; and have fever, cough, and shortness of breath within the last 2 weeks of travel OR . Been in close contact with a person diagnosed with COVID-19 within the last 2 weeks and have fever, cough, and shortness of breath . IF YOU DO NOT MEET THESE CRITERIA, YOU ARE CONSIDERED LOW RISK FOR COVID-19.  What to do if you are HIGH RISK for COVID-19?  . If you are having a medical emergency, call 911. . Seek medical care right away. Before you go to a doctor's office, urgent care or  emergency department, call ahead and tell them about your recent travel, contact with someone diagnosed with COVID-19, and your symptoms. You should receive instructions from your physician's office regarding next steps of care.  . When you arrive at healthcare provider, tell the healthcare staff immediately you have returned from visiting [place], [place], [place], [place] or [place]; or traveled in the [place] to [place], [place], [place], or [place]; in the last two weeks or you have been in close contact with a person diagnosed with COVID-19 in the last 2 weeks.   . Tell the health care staff about your symptoms: fever, cough and shortness of breath. . After you have been seen by a medical provider, you will be either: o Tested for (COVID-19) and discharged home on quarantine except to seek medical care if symptoms worsen, and asked to  - Stay home and avoid contact with others until you get your results (4-5 days)  - Avoid travel on public transportation if possible (such as bus, train, or airplane) or o Sent to the Emergency Department by EMS for evaluation, COVID-19 testing, and possible admission depending on your condition and test results.  What to do if you are LOW RISK for COVID-19?  Reduce your risk of any infection by using the same precautions used for avoiding the common cold or flu:  . Wash your hands often with soap and warm water for at least 20 seconds.  If soap and water are not readily available, use an alcohol-based hand sanitizer with at least 60% alcohol.  .   If coughing or sneezing, cover your mouth and nose by coughing or sneezing into the elbow areas of your shirt or coat, into a tissue or into your sleeve (not your hands). . Avoid shaking hands with others and consider head nods or verbal greetings only. . Avoid touching your eyes, nose, or mouth with unwashed hands.  . Avoid close contact with people who are sick. . Avoid places or events with large numbers  of people in one location, like concerts or sporting events. . Carefully consider travel plans you have or are making. . If you are planning any travel outside or inside the [place], visit the CDC's Travelers' Health webpage for the latest health notices. . If you have some symptoms but not all symptoms, continue to monitor at home and seek medical attention if your symptoms worsen. . If you are having a medical emergency, call 911.   [place] [facility] / e-Visit: [email or web address]         [facility]: [facility]: [phone]                   [facility]: [phone]

## 2019-10-27 ENCOUNTER — Telehealth: Payer: Self-pay | Admitting: *Deleted

## 2019-10-27 ENCOUNTER — Other Ambulatory Visit (HOSPITAL_COMMUNITY): Payer: Self-pay

## 2019-10-27 ENCOUNTER — Inpatient Hospital Stay: Payer: Medicare Other | Attending: Nurse Practitioner

## 2019-10-27 ENCOUNTER — Other Ambulatory Visit: Payer: Self-pay

## 2019-10-27 VITALS — BP 169/89 | HR 61 | Temp 98.7°F | Resp 18

## 2019-10-27 DIAGNOSIS — I5022 Chronic systolic (congestive) heart failure: Secondary | ICD-10-CM | POA: Diagnosis not present

## 2019-10-27 DIAGNOSIS — D509 Iron deficiency anemia, unspecified: Secondary | ICD-10-CM | POA: Diagnosis not present

## 2019-10-27 DIAGNOSIS — J45909 Unspecified asthma, uncomplicated: Secondary | ICD-10-CM | POA: Diagnosis not present

## 2019-10-27 DIAGNOSIS — Z9221 Personal history of antineoplastic chemotherapy: Secondary | ICD-10-CM | POA: Diagnosis not present

## 2019-10-27 DIAGNOSIS — R110 Nausea: Secondary | ICD-10-CM | POA: Diagnosis not present

## 2019-10-27 DIAGNOSIS — R197 Diarrhea, unspecified: Secondary | ICD-10-CM | POA: Insufficient documentation

## 2019-10-27 DIAGNOSIS — Z79899 Other long term (current) drug therapy: Secondary | ICD-10-CM | POA: Diagnosis not present

## 2019-10-27 DIAGNOSIS — Z5189 Encounter for other specified aftercare: Secondary | ICD-10-CM | POA: Diagnosis not present

## 2019-10-27 DIAGNOSIS — C184 Malignant neoplasm of transverse colon: Secondary | ICD-10-CM | POA: Insufficient documentation

## 2019-10-27 DIAGNOSIS — Z5111 Encounter for antineoplastic chemotherapy: Secondary | ICD-10-CM | POA: Insufficient documentation

## 2019-10-27 DIAGNOSIS — Z8616 Personal history of COVID-19: Secondary | ICD-10-CM | POA: Diagnosis not present

## 2019-10-27 DIAGNOSIS — N261 Atrophy of kidney (terminal): Secondary | ICD-10-CM | POA: Diagnosis not present

## 2019-10-27 DIAGNOSIS — I700 Atherosclerosis of aorta: Secondary | ICD-10-CM | POA: Insufficient documentation

## 2019-10-27 MED ORDER — HEPARIN SOD (PORK) LOCK FLUSH 100 UNIT/ML IV SOLN
500.0000 [IU] | Freq: Once | INTRAVENOUS | Status: AC | PRN
  Administered 2019-10-27: 500 [IU]
  Filled 2019-10-27: qty 5

## 2019-10-27 MED ORDER — PEGFILGRASTIM-CBQV 6 MG/0.6ML ~~LOC~~ SOSY
6.0000 mg | PREFILLED_SYRINGE | Freq: Once | SUBCUTANEOUS | Status: AC
  Administered 2019-10-27: 6 mg via SUBCUTANEOUS

## 2019-10-27 MED ORDER — SODIUM CHLORIDE 0.9% FLUSH
10.0000 mL | INTRAVENOUS | Status: DC | PRN
  Administered 2019-10-27: 10 mL
  Filled 2019-10-27: qty 10

## 2019-10-27 NOTE — Patient Instructions (Signed)
Implanted Port Home Guide An implanted port is a device that is placed under the skin. It is usually placed in the chest. The device can be used to give IV medicine, to take blood, or for dialysis. You may have an implanted port if:  You need IV medicine that would be irritating to the small veins in your hands or arms.  You need IV medicines, such as antibiotics, for a long period of time.  You need IV nutrition for a long period of time.  You need dialysis. Having a port means that your health care provider will not need to use the veins in your arms for these procedures. You may have fewer limitations when using a port than you would if you used other types of long-term IVs, and you will likely be able to return to normal activities after your incision heals. An implanted port has two main parts:  Reservoir. The reservoir is the part where a needle is inserted to give medicines or draw blood. The reservoir is round. After it is placed, it appears as a small, raised area under your skin.  Catheter. The catheter is a thin, flexible tube that connects the reservoir to a vein. Medicine that is inserted into the reservoir goes into the catheter and then into the vein. How is my port accessed? To access your port:  A numbing cream may be placed on the skin over the port site.  Your health care provider will put on a mask and sterile gloves.  The skin over your port will be cleaned carefully with a germ-killing soap and allowed to dry.  Your health care provider will gently pinch the port and insert a needle into it.  Your health care provider will check for a blood return to make sure the port is in the vein and is not clogged.  If your port needs to remain accessed to get medicine continuously (constant infusion), your health care provider will place a clear bandage (dressing) over the needle site. The dressing and needle will need to be changed every week, or as told by your health care  provider. What is flushing? Flushing helps keep the port from getting clogged. Follow instructions from your health care provider about how and when to flush the port. Ports are usually flushed with saline solution or a medicine called heparin. The need for flushing will depend on how the port is used:  If the port is only used from time to time to give medicines or draw blood, the port may need to be flushed: ? Before and after medicines have been given. ? Before and after blood has been drawn. ? As part of routine maintenance. Flushing may be recommended every 4-6 weeks.  If a constant infusion is running, the port may not need to be flushed.  Throw away any syringes in a disposal container that is meant for sharp items (sharps container). You can buy a sharps container from a pharmacy, or you can make one by using an empty hard plastic bottle with a cover. How long will my port stay implanted? The port can stay in for as long as your health care provider thinks it is needed. When it is time for the port to come out, a surgery will be done to remove it. The surgery will be similar to the procedure that was done to put the port in. Follow these instructions at home:   Flush your port as told by your health care provider.    If you need an infusion over several days, follow instructions from your health care provider about how to take care of your port site. Make sure you: ? Wash your hands with soap and water before you change your dressing. If soap and water are not available, use alcohol-based hand sanitizer. ? Change your dressing as told by your health care provider. ? Place any used dressings or infusion bags into a plastic bag. Throw that bag in the trash. ? Keep the dressing that covers the needle clean and dry. Do not get it wet. ? Do not use scissors or sharp objects near the tube. ? Keep the tube clamped, unless it is being used.  Check your port site every day for signs of  infection. Check for: ? Redness, swelling, or pain. ? Fluid or blood. ? Pus or a bad smell.  Protect the skin around the port site. ? Avoid wearing bra straps that rub or irritate the site. ? Protect the skin around your port from seat belts. Place a soft pad over your chest if needed.  Bathe or shower as told by your health care provider. The site may get wet as long as you are not actively receiving an infusion.  Return to your normal activities as told by your health care provider. Ask your health care provider what activities are safe for you.  Carry a medical alert card or wear a medical alert bracelet at all times. This will let health care providers know that you have an implanted port in case of an emergency. Get help right away if:  You have redness, swelling, or pain at the port site.  You have fluid or blood coming from your port site.  You have pus or a bad smell coming from the port site.  You have a fever. Summary  Implanted ports are usually placed in the chest for long-term IV access.  Follow instructions from your health care provider about flushing the port and changing bandages (dressings).  Take care of the area around your port by avoiding clothing that puts pressure on the area, and by watching for signs of infection.  Protect the skin around your port from seat belts. Place a soft pad over your chest if needed.  Get help right away if you have a fever or you have redness, swelling, pain, drainage, or a bad smell at the port site. This information is not intended to replace advice given to you by your health care provider. Make sure you discuss any questions you have with your health care provider. Document Revised: 11/05/2018 Document Reviewed: 08/16/2016 Elsevier Patient Education  2020 Else[employer]. Pegfilgrastim injection What is this medicine? PEGFILGRASTIM (PEG fil gra stim) is a long-acting granulocyte colony-stimulating factor that stimulates the  growth of neutrophils, a type of white blood cell important in the body's fight against infection. It is used to reduce the incidence of fever and infection in patients with certain types of cancer who are receiving chemotherapy that affects the bone marrow, and to increase survival after being exposed to high doses of radiation. This medicine may be used for other purposes; ask your health care provider or pharmacist if you have questions. COMMON BRAND NAME(S): Fulphila, Neulasta, UDENYCA, Ziextenzo What should I tell my health care provider before I take this medicine? They need to know if you have any of these conditions:  kidney disease  latex allergy  ongoing radiation therapy  sickle cell disease  skin reactions to acrylic adhesives (On-Body   Injector only)  an unusual or allergic reaction to pegfilgrastim, filgrastim, other medicines, foods, dyes, or preservatives  pregnant or trying to get pregnant  breast-feeding How should I use this medicine? This medicine is for injection under the skin. If you get this medicine at home, you will be taught how to prepare and give the pre-filled syringe or how to use the On-body Injector. Refer to the patient Instructions for Use for detailed instructions. Use exactly as directed. Tell your healthcare provider immediately if you suspect that the On-body Injector may not have performed as intended or if you suspect the use of the On-body Injector resulted in a missed or partial dose. It is important that you put your used needles and syringes in a special sharps container. Do not put them in a trash can. If you do not have a sharps container, call your pharmacist or healthcare provider to get one. Talk to your pediatrician regarding the use of this medicine in children. While this drug may be prescribed for selected conditions, precautions do apply. Overdosage: If you think you have taken too much of this medicine contact a poison control center or  emergency room at once. NOTE: This medicine is only for you. Do not share this medicine with others. What if I miss a dose? It is important not to miss your dose. Call your doctor or health care professional if you miss your dose. If you miss a dose due to an On-body Injector failure or leakage, a new dose should be administered as soon as possible using a single prefilled syringe for manual use. What may interact with this medicine? Interactions have not been studied. Give your health care provider a list of all the medicines, herbs, non-prescription drugs, or dietary supplements you use. Also tell them if you smoke, drink alcohol, or use illegal drugs. Some items may interact with your medicine. This list may not describe all possible interactions. Give your health care provider a list of all the medicines, herbs, non-prescription drugs, or dietary supplements you use. Also tell them if you smoke, drink alcohol, or use illegal drugs. Some items may interact with your medicine. What should I watch for while using this medicine? You may need blood work done while you are taking this medicine. If you are going to need a MRI, CT scan, or other procedure, tell your doctor that you are using this medicine (On-Body Injector only). What side effects may I notice from receiving this medicine? Side effects that you should report to your doctor or health care professional as soon as possible:  allergic reactions like skin rash, itching or hives, swelling of the face, lips, or tongue  back pain  dizziness  fever  pain, redness, or irritation at site where injected  pinpoint red spots on the skin  red or dark-brown urine  shortness of breath or breathing problems  stomach or side pain, or pain at the shoulder  swelling  tiredness  trouble passing urine or change in the amount of urine Side effects that usually do not require medical attention (report to your doctor or health care  professional if they continue or are bothersome):  bone pain  muscle pain This list may not describe all possible side effects. Call your doctor for medical advice about side effects. You may report side effects to FDA at 1-800-FDA-1088. Where should I keep my medicine? Keep out of the reach of children. If you are using this medicine at home, you will be instructed   on how to store it. Throw away any unused medicine after the expiration date on the label. NOTE: This sheet is a summary. It may not cover all possible information. If you have questions about this medicine, talk to your doctor, pharmacist, or health care provider.  2020 Elsevier/Gold Standard (2017-10-19 16:57:08)  

## 2019-10-27 NOTE — Progress Notes (Signed)
Paramedicine Encounter    Patient ID: Joe Garcia, male    DOB: 08/26/1950, 69 y.o.   MRN: [id number]   Patient Care Team: Medicine, [facility] as PCP - General [name], LCSW as Social Worker (Licensed Clinical Social Worker)  Patient Active Problem List   Diagnosis Date Noted  . Port-A-Cath in place 08/04/2019  . Chronic systolic heart failure ([place])   . Colonic obstruction ([place])   . 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 Nausea & vomiting 06/26/2019  . Colonic mass 06/26/2019  . COPD GOLD ?  11/24/2018  . Chest pain   . NSTEMI (non-ST elevated myocardial infarction) ([place]) 10/03/2018  . COPD with acute exacerbation ([place]) 07/26/2018  . ARF (acute renal failure) ([place]) 07/26/2018  . Nausea and vomiting 07/26/2018  . Weight loss, unintentional 07/26/2018  . Malnutrition of moderate degree 07/16/2017  . 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 Solitary kidney, congenital 06/23/2017  . Nonischemic cardiomyopathy ([place])   . Multifocal PVCs   . Chronic combined systolic (congestive) and diastolic (congestive) heart failure ([place]) 06/11/2017  . Hypokalemia 05/05/2017  . DOE (dyspnea on exertion) 07/16/2013  . Alcohol abuse 09/25/2012  . Alcohol dependence ([place]) 09/22/2012  . Essential hypertension 03/22/2007  . DEGENERATIVE DISC DISEASE 03/22/2007  . AVASCULAR NECROSIS 03/22/2007    Current Outpatient Medications:  .  acetaminophen (TYLENOL) 500 MG tablet, Take 2 tablets (1,000 mg total) by mouth 3 (three) times daily as needed for headache (pain)., Disp: 30 tablet, Rfl: 0 .  albuterol (PROVENTIL) (2.5 MG/3ML) 0.083% nebulizer solution, Take 3 mLs (2.5 mg total) by nebulization every 6 (six) hours as needed for wheezing or shortness of breath., Disp: 75 mL, Rfl: 0 .  albuterol (VENTOLIN HFA) 108 (90 Base) MCG/ACT inhaler, Inhale 2 puffs into the lungs every 4 (four) hours as needed for wheezing or shortness of breath., Disp:  18 g, Rfl: 0 .  amiodarone (PACERONE) 100 MG tablet, Take 1 tablet (100 mg total) by mouth daily., Disp: 30 tablet, Rfl: 6 .  budesonide-formoterol (SYMBICORT) 160-4.5 MCG/ACT inhaler, Inhale 2 puffs into the lungs 2 (two) times daily., Disp: 1 Inhaler, Rfl: 11 .  carvedilol (COREG) 3.125 MG tablet, Take 1 tablet (3.125 mg total) by mouth 2 (two) times daily with a meal., Disp: 60 tablet, Rfl: 5 .  fluticasone (FLONASE) 50 MCG/ACT nasal spray, Place 2 sprays into both nostrils daily., Disp: 16 g, Rfl: 0 .  furosemide (LASIX) 20 MG tablet, Take 1 tablet (20 mg total) by mouth daily., Disp: 30 tablet, Rfl: 5 .  isosorbide-hydrALAZINE (BIDIL) 20-37.5 MG tablet, Take 0.5 tablets by mouth 3 (three) times daily. , Disp: , Rfl:  .  lidocaine-prilocaine (EMLA) cream, Apply to portacath site 1-2 hours prior to use (Patient taking differently: Apply 1 application topically See admin instructions. Apply to portacath site 1-2 hours prior to use), Disp: 30 g, Rfl: 3 .  loratadine (CLARITIN) 10 MG tablet, Take 10 mg by mouth daily., Disp: , Rfl:  .  losartan (COZAAR) 25 MG tablet, TAKE 1 TABLET BY MOUTH EVERY DAY (Patient taking differently: Take 25 mg by mouth at bedtime. ), Disp: 30 tablet, Rfl: 6 .  montelukast (SINGULAIR) 10 MG tablet, Take 10 mg by mouth at bedtime., Disp: , Rfl:  .  prochlorperazine (COMPAZINE) 10 MG tablet, Take 10 mg by mouth every 6 (six) hours as needed for nausea or vomiting., Disp: , Rfl:  .  spironolactone (  ALDACTONE) 25 MG tablet, TAKE 1 TABLET(25 MG) BY MOUTH DAILY, Disp: 30 tablet, Rfl: 3 No current facility-administered medications for this visit.  Facility-Administered Medications Ordered in Other Visits:  .  sodium chloride flush (NS) 0.9 % injection 10 mL, 10 mL, Intracatheter, PRN, Ladell Pier, MD, 10 mL at 10/27/19 1333 Allergies  Allergen Reactions  . Lisinopril Swelling    Facial and tongue swelling 10/2012      Social History   Socioeconomic History  .  Marital status: Single    Spouse name: Not on file  . Number of children: Not on file  . Years of education: Not on file  . Highest education level: Not on file  Occupational History  . Occupation: "it don't work for me", on disability  Tobacco Use  . Smoking status: Former Smoker    Packs/day: 0.10    Years: 45.00    Pack years: 4.50    Quit date: 2012    Years since quitting: 9.2  . Smokeless tobacco: Never Used  Substance and Sexual Activity  . Alcohol use: No    Comment: Pt states no etoh since April 2014  . Drug use: No    Comment: Prior use of crack cocaine, quit 2012  . Sexual activity: Never  Other Topics Concern  . Not on file  Social History Narrative  . Not on file   Social Determinants of Health   Financial Resource Strain:   . Difficulty of Paying Living Expenses:   Food Insecurity:   . Worried About Charity fundraiser in the Last Year:   . Arboriculturist in the Last Year:   Transportation Needs:   . Film/video editor (Medical):   Marland Kitchen Lack of Transportation (Non-Medical):   Physical Activity:   . Days of Exercise per Week:   . Minutes of Exercise per Session:   Stress:   . Feeling of Stress :   Social Connections:   . Frequency of Communication with Friends and Family:   . Frequency of Social Gatherings with Friends and Family:   . Attends Religious Services:   . Active Member of Clubs or Organizations:   . Attends Archivist Meetings:   Marland Kitchen Marital Status:   Intimate Partner Violence:   . Fear of Current or Ex-Partner:   . Emotionally Abused:   Marland Kitchen Physically Abused:   . Sexually Abused:     Physical Exam      Future Appointments  Date Time Provider [place]  11/07/2019  3:30 PM WL-CT 1 WL-CT [place]  11/08/2019 10:00 AM [facility] LAB/FLUSH [facility] None  11/08/2019 10:15 AM [facility] INFUSION [facility] None  11/08/2019 10:45 AM Owens Shark, NP [facility] None  11/08/2019 12:00 PM [facility] INFUSION  [facility] None  11/10/2019 11:15 AM [facility] FLUSH [facility] None    BP (!) 164/90   Pulse 65   Temp 99.3 F (37.4 C)   Resp 18   Wt 154 lb (69.9 kg)   SpO2 98%   BMI 21.48 kg/m   Weight yesterday-158 @ clinic  Last visit weight-159 @ clinic   He reports he has been doing ok, he has been taking chemo treatments.  Has been unable to weigh-needs new batteries for scales.  He reports his b/p has been very high lately. He has missed a few doses of his oral meds.  He states the week he was sick a couple wks ago he missed a few days worth. He does  have intermittent h/a with dizziness. He has c/o of this for a very long time and he was told to take tylenols for it. I did tell him to tell his cancer doc too to see if that needs further eval.  However he is on bidil and that has caused him h/a in the past but still wouldn't hurt Pt states his breathing has been doing poorly-he had COVID several wks ago. He said he went to [facility] for an infusion and the next day he felt very sick but after that he felt better. He did get the 2nd COVID vaccine. He did have a reaction with that 2nd dose vaccine. He gets more sob when upon exertion. He uses his inhaler multiple times a day. Lungs clear. No edema noted.  He has several med errors in his pill box-he had a couple of carvedilols and amio in same day doses.  He needs to be switched to bubble packs --he just p/u his spiro and he needs lasix--so we will fill pill box for the next month and then when he needs new refills then we will switch to [place].   --he called his daughter to see if she can sch eye exam to where she goes. He states his vision is cloudy.   Marylouise Stacks, [facility] Paramedic  10/27/19

## 2019-10-27 NOTE — Telephone Encounter (Addendum)
Per Dr. [name]: Needs CT abd/pelvis w/contrast to f/u on positive mesenteric lymph node margin prior to appointment on 11/08/19. CT did not require PA per managed care. Scheduled for 4/12 at 1515/1530 and NPO 4 hours prior. Contrast at 1330 and 1430. Patient notified and was provided the oral contrast for his scan. Instructions written down and he verbalizes understanding.

## 2019-10-31 ENCOUNTER — Telehealth (HOSPITAL_COMMUNITY): Payer: Self-pay | Admitting: Licensed Clinical Social Worker

## 2019-10-31 NOTE — Telephone Encounter (Signed)
CSW informed by community paramedic that pt having eye concerns and needing an exam.  Pt has traditional Medicare and Medicaid- traditional Medicare will not pay for routine eye exam so CSW was assisting in finding somewhere that could take Medicaid.  [facility] who say they can book an exam for pt with Medicare/medicaid but could not guarantee it would be covered as Medicaid was the secondary payor and don't know if they would approve coverage since Medicare was technically the primary payor.  CSW explained to patient and he does not want to take risk at this time in getting charged so would prefer to apply for free eye exam through [facility].  CSW sent application to [employer] who will take to pt during next visit.  CSW will continue to follow and assist as needed.  [name], LCSW Clinical Social Worker [facility] Desk#: [phone] Cell#: [phone]

## 2019-11-01 ENCOUNTER — Other Ambulatory Visit (HOSPITAL_COMMUNITY): Payer: Self-pay

## 2019-11-01 NOTE — Progress Notes (Signed)
Paramedicine Encounter    Patient ID: Joe Garcia, male    DOB: 1951/02/28, 69 y.o.   MRN: [id number]   Patient Care Team: Medicine, [facility] as PCP - General [name], LCSW as Social Worker (Licensed Clinical Social Worker)  Patient Active Problem List   Diagnosis Date Noted  . Port-A-Cath in place 08/04/2019  . Chronic systolic heart failure ([place])   . Colonic obstruction ([place])   . 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 Nausea & vomiting 06/26/2019  . Colonic mass 06/26/2019  . COPD GOLD ?  11/24/2018  . Chest pain   . NSTEMI (non-ST elevated myocardial infarction) ([place]) 10/03/2018  . COPD with acute exacerbation ([place]) 07/26/2018  . ARF (acute renal failure) ([place]) 07/26/2018  . Nausea and vomiting 07/26/2018  . Weight loss, unintentional 07/26/2018  . Malnutrition of moderate degree 07/16/2017  . 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 Solitary kidney, congenital 06/23/2017  . Nonischemic cardiomyopathy ([place])   . Multifocal PVCs   . Chronic combined systolic (congestive) and diastolic (congestive) heart failure ([place]) 06/11/2017  . Hypokalemia 05/05/2017  . DOE (dyspnea on exertion) 07/16/2013  . Alcohol abuse 09/25/2012  . Alcohol dependence ([place]) 09/22/2012  . Essential hypertension 03/22/2007  . DEGENERATIVE DISC DISEASE 03/22/2007  . AVASCULAR NECROSIS 03/22/2007    Current Outpatient Medications:  .  acetaminophen (TYLENOL) 500 MG tablet, Take 2 tablets (1,000 mg total) by mouth 3 (three) times daily as needed for headache (pain)., Disp: 30 tablet, Rfl: 0 .  albuterol (PROVENTIL) (2.5 MG/3ML) 0.083% nebulizer solution, Take 3 mLs (2.5 mg total) by nebulization every 6 (six) hours as needed for wheezing or shortness of breath., Disp: 75 mL, Rfl: 0 .  albuterol (VENTOLIN HFA) 108 (90 Base) MCG/ACT inhaler, Inhale 2 puffs into the lungs every 4 (four) hours as needed for wheezing or shortness of breath., Disp:  18 g, Rfl: 0 .  amiodarone (PACERONE) 100 MG tablet, Take 1 tablet (100 mg total) by mouth daily., Disp: 30 tablet, Rfl: 6 .  budesonide-formoterol (SYMBICORT) 160-4.5 MCG/ACT inhaler, Inhale 2 puffs into the lungs 2 (two) times daily., Disp: 1 Inhaler, Rfl: 11 .  carvedilol (COREG) 3.125 MG tablet, Take 1 tablet (3.125 mg total) by mouth 2 (two) times daily with a meal., Disp: 60 tablet, Rfl: 5 .  fluticasone (FLONASE) 50 MCG/ACT nasal spray, Place 2 sprays into both nostrils daily., Disp: 16 g, Rfl: 0 .  furosemide (LASIX) 20 MG tablet, Take 1 tablet (20 mg total) by mouth daily., Disp: 30 tablet, Rfl: 5 .  isosorbide-hydrALAZINE (BIDIL) 20-37.5 MG tablet, Take 0.5 tablets by mouth 3 (three) times daily. , Disp: , Rfl:  .  lidocaine-prilocaine (EMLA) cream, Apply to portacath site 1-2 hours prior to use (Patient taking differently: Apply 1 application topically See admin instructions. Apply to portacath site 1-2 hours prior to use), Disp: 30 g, Rfl: 3 .  loratadine (CLARITIN) 10 MG tablet, Take 10 mg by mouth daily., Disp: , Rfl:  .  losartan (COZAAR) 25 MG tablet, TAKE 1 TABLET BY MOUTH EVERY DAY (Patient taking differently: Take 25 mg by mouth at bedtime. ), Disp: 30 tablet, Rfl: 6 .  montelukast (SINGULAIR) 10 MG tablet, Take 10 mg by mouth at bedtime., Disp: , Rfl:  .  prochlorperazine (COMPAZINE) 10 MG tablet, Take 10 mg by mouth every 6 (six) hours as needed for nausea or vomiting., Disp: , Rfl:  .  spironolactone (  ALDACTONE) 25 MG tablet, TAKE 1 TABLET(25 MG) BY MOUTH DAILY, Disp: 30 tablet, Rfl: 3 Allergies  Allergen Reactions  . Lisinopril Swelling    Facial and tongue swelling 10/2012      Social History   Socioeconomic History  . Marital status: Single    Spouse name: Not on file  . Number of children: Not on file  . Years of education: Not on file  . Highest education level: Not on file  Occupational History  . Occupation: "it don't work for me", on disability  Tobacco Use   . Smoking status: Former Smoker    Packs/day: 0.10    Years: 45.00    Pack years: 4.50    Quit date: 2012    Years since quitting: 9.2  . Smokeless tobacco: Never Used  Substance and Sexual Activity  . Alcohol use: No    Comment: Pt states no etoh since April 2014  . Drug use: No    Comment: Prior use of crack cocaine, quit 2012  . Sexual activity: Never  Other Topics Concern  . Not on file  Social History Narrative  . Not on file   Social Determinants of Health   Financial Resource Strain:   . Difficulty of Paying Living Expenses:   Food Insecurity:   . Worried About Charity fundraiser in the Last Year:   . Arboriculturist in the Last Year:   Transportation Needs:   . Film/video editor (Medical):   Marland Kitchen Lack of Transportation (Non-Medical):   Physical Activity:   . Days of Exercise per Week:   . Minutes of Exercise per Session:   Stress:   . Feeling of Stress :   Social Connections:   . Frequency of Communication with Friends and Family:   . Frequency of Social Gatherings with Friends and Family:   . Attends Religious Services:   . Active Member of Clubs or Organizations:   . Attends Archivist Meetings:   Marland Kitchen Marital Status:   Intimate Partner Violence:   . Fear of Current or Ex-Partner:   . Emotionally Abused:   Marland Kitchen Physically Abused:   . Sexually Abused:     Physical Exam      Future Appointments  Date Time Provider [place]  11/07/2019  3:30 PM WL-CT 1 WL-CT [place]  11/08/2019 10:00 AM [facility] LAB/FLUSH [facility] None  11/08/2019 10:15 AM [facility] INFUSION [facility] None  11/08/2019 10:45 AM Owens Shark, NP [facility] None  11/08/2019 12:00 PM [facility] INFUSION [facility] None  11/10/2019 11:15 AM [facility] FLUSH [facility] None    BP 134/82   Pulse 66   Temp 98.7 F (37.1 C)   Resp 18   SpO2 97%   Weight yesterday-? Last visit weight-154  Pt reports doing ok,  He is trying to locate his SS benefit  statement so he can apply for some vision coverage for eye exams. Got the app signed by him so will wait on proof of income to submit it to jenna for her to send in.  meds verified and pill box refilled.  Pt denies c/p, no dizziness, no increased sob-his allergies have been bothering him. Will f/u next week.   Marylouise Stacks, [facility] Paramedic  11/01/19

## 2019-11-02 NOTE — Progress Notes (Signed)
Pharmacist Chemotherapy Monitoring - Follow Up Assessment    I verify that I have reviewed each item in the below checklist:  . Regimen for the patient is scheduled for the appropriate day and plan matches scheduled date. Marland Kitchen Appropriate non-routine labs are ordered dependent on drug ordered. . If applicable, additional medications reviewed and ordered per protocol based on lifetime cumulative doses and/or treatment regimen.   Plan for follow-up and/or issues identified: No . I-vent associated with next due treatment: No . MD and/or nursing notified: No  Romualdo Bolk [facility] 11/02/2019 3:15 PM

## 2019-11-06 ENCOUNTER — Other Ambulatory Visit: Payer: Self-pay | Admitting: Oncology

## 2019-11-07 ENCOUNTER — Other Ambulatory Visit: Payer: Self-pay

## 2019-11-07 ENCOUNTER — Ambulatory Visit (HOSPITAL_COMMUNITY)
Admission: RE | Admit: 2019-11-07 | Discharge: 2019-11-07 | Disposition: A | Discharge status: Home / Self Care | Payer: Medicare Other | Source: Ambulatory Visit | Attending: Oncology | Admitting: Oncology

## 2019-11-07 DIAGNOSIS — C184 Malignant neoplasm of transverse colon: Secondary | ICD-10-CM | POA: Diagnosis present

## 2019-11-07 MED ORDER — IOHEXOL 300 MG/ML  SOLN
100.0000 mL | Freq: Once | INTRAMUSCULAR | Status: AC | PRN
  Administered 2019-11-07: 16:00:00 100 mL via INTRAVENOUS

## 2019-11-07 MED ORDER — SODIUM CHLORIDE (PF) 0.9 % IJ SOLN
INTRAMUSCULAR | Status: AC
  Filled 2019-11-07: qty 50

## 2019-11-08 ENCOUNTER — Inpatient Hospital Stay (HOSPITAL_BASED_OUTPATIENT_CLINIC_OR_DEPARTMENT_OTHER): Payer: Medicare Other | Admitting: Nurse Practitioner

## 2019-11-08 ENCOUNTER — Inpatient Hospital Stay: Payer: Medicare Other

## 2019-11-08 ENCOUNTER — Encounter: Payer: Self-pay | Admitting: Nurse Practitioner

## 2019-11-08 ENCOUNTER — Other Ambulatory Visit: Payer: Self-pay

## 2019-11-08 VITALS — BP 156/72 | HR 60 | Temp 98.3°F | Resp 18 | Ht 71.0 in | Wt 159.1 lb

## 2019-11-08 DIAGNOSIS — C184 Malignant neoplasm of transverse colon: Secondary | ICD-10-CM | POA: Diagnosis not present

## 2019-11-08 DIAGNOSIS — Z5111 Encounter for antineoplastic chemotherapy: Secondary | ICD-10-CM | POA: Diagnosis not present

## 2019-11-08 DIAGNOSIS — Z95828 Presence of other vascular implants and grafts: Secondary | ICD-10-CM

## 2019-11-08 LAB — CMP (CANCER CENTER ONLY)
ALT: 16 U/L (ref 0–44)
AST: 17 U/L (ref 15–41)
Albumin: 3.6 g/dL (ref 3.5–5.0)
Alkaline Phosphatase: 116 U/L (ref 38–126)
Anion gap: 12 (ref 5–15)
BUN: 10 mg/dL (ref 8–23)
CO2: 23 mmol/L (ref 22–32)
Calcium: 8.8 mg/dL — ABNORMAL LOW (ref 8.9–10.3)
Chloride: 105 mmol/L (ref 98–111)
Creatinine: 1.22 mg/dL (ref 0.61–1.24)
GFR, Est AFR Am: >60 mL/min (ref >60)
GFR, Estimated: >60 mL/min (ref >60)
Glucose, Bld: 109 mg/dL — ABNORMAL HIGH (ref 70–99)
Potassium: 3.8 mmol/L (ref 3.5–5.1)
Sodium: 140 mmol/L (ref 135–145)
Total Bilirubin: 0.2 mg/dL — ABNORMAL LOW (ref 0.3–1.2)
Total Protein: 6.6 g/dL (ref 6.5–8.1)

## 2019-11-08 LAB — CBC WITH DIFFERENTIAL (CANCER CENTER ONLY)
Abs Immature Granulocytes: 0.06 10*3/uL (ref 0.00–0.07)
Basophils Absolute: 0.1 10*3/uL (ref 0.0–0.1)
Basophils Relative: 1 %
Eosinophils Absolute: 0.2 10*3/uL (ref 0.0–0.5)
Eosinophils Relative: 3 %
HCT: 35.8 % — ABNORMAL LOW (ref 39.0–52.0)
Hemoglobin: 11.5 g/dL — ABNORMAL LOW (ref 13.0–17.0)
Immature Granulocytes: 1 %
Lymphocytes Relative: 34 %
Lymphs Abs: 2 10*3/uL (ref 0.7–4.0)
MCH: 28.3 pg (ref 26.0–34.0)
MCHC: 32.1 g/dL (ref 30.0–36.0)
MCV: 88 fL (ref 80.0–100.0)
Monocytes Absolute: 0.6 10*3/uL (ref 0.1–1.0)
Monocytes Relative: 10 %
Neutro Abs: 3 10*3/uL (ref 1.7–7.7)
Neutrophils Relative %: 51 %
Platelet Count: 188 10*3/uL (ref 150–400)
RBC: 4.07 MIL/uL — ABNORMAL LOW (ref 4.22–5.81)
RDW: 17.1 % — ABNORMAL HIGH (ref 11.5–15.5)
WBC Count: 5.9 10*3/uL (ref 4.0–10.5)
nRBC: 0 % (ref 0.0–0.2)

## 2019-11-08 MED ORDER — SODIUM CHLORIDE 0.9% FLUSH
10.0000 mL | INTRAVENOUS | Status: DC | PRN
  Filled 2019-11-08: qty 10

## 2019-11-08 MED ORDER — LEUCOVORIN CALCIUM INJECTION 350 MG
400.0000 mg/m2 | Freq: Once | INTRAVENOUS | Status: AC
  Administered 2019-11-08: 732 mg via INTRAVENOUS
  Filled 2019-11-08: qty 36.6

## 2019-11-08 MED ORDER — PALONOSETRON HCL INJECTION 0.25 MG/5ML
0.2500 mg | Freq: Once | INTRAVENOUS | Status: AC
  Administered 2019-11-08: 0.25 mg via INTRAVENOUS

## 2019-11-08 MED ORDER — SODIUM CHLORIDE 0.9% FLUSH
10.0000 mL | Freq: Once | INTRAVENOUS | Status: AC
  Administered 2019-11-08: 10 mL
  Filled 2019-11-08: qty 10

## 2019-11-08 MED ORDER — OXALIPLATIN CHEMO INJECTION 100 MG/20ML
65.0000 mg/m2 | Freq: Once | INTRAVENOUS | Status: AC
  Administered 2019-11-08: 120 mg via INTRAVENOUS
  Filled 2019-11-08: qty 10

## 2019-11-08 MED ORDER — PALONOSETRON HCL INJECTION 0.25 MG/5ML
INTRAVENOUS | Status: AC
  Filled 2019-11-08: qty 5

## 2019-11-08 MED ORDER — DEXTROSE 5 % IV SOLN
Freq: Once | INTRAVENOUS | Status: DC
  Filled 2019-11-08: qty 250

## 2019-11-08 MED ORDER — SODIUM CHLORIDE 0.9 % IV SOLN
10.0000 mg | Freq: Once | INTRAVENOUS | Status: AC
  Administered 2019-11-08: 13:00:00 10 mg via INTRAVENOUS
  Filled 2019-11-08: qty 10

## 2019-11-08 MED ORDER — FLUOROURACIL CHEMO INJECTION 2.5 GM/50ML
400.0000 mg/m2 | Freq: Once | INTRAVENOUS | Status: AC
  Administered 2019-11-08: 750 mg via INTRAVENOUS
  Filled 2019-11-08: qty 15

## 2019-11-08 MED ORDER — SODIUM CHLORIDE 0.9 % IV SOLN
2400.0000 mg/m2 | INTRAVENOUS | Status: DC
  Administered 2019-11-08: 4400 mg via INTRAVENOUS
  Filled 2019-11-08: qty 88

## 2019-11-08 MED ORDER — HEPARIN SOD (PORK) LOCK FLUSH 100 UNIT/ML IV SOLN
500.0000 [IU] | Freq: Once | INTRAVENOUS | Status: DC | PRN
  Filled 2019-11-08: qty 5

## 2019-11-08 MED ORDER — DEXTROSE 5 % IV SOLN
Freq: Once | INTRAVENOUS | Status: AC
  Filled 2019-11-08: qty 250

## 2019-11-08 NOTE — Patient Instructions (Signed)
[facility] Discharge Instructions for Patients Receiving Chemotherapy  Today you received the following chemotherapy agents: Oxaliplatin, Leucovorin, and Fluorouracil.  To help prevent nausea and vomiting after your treatment, we encourage you to take your nausea medication as directed by your MD.   If you develop nausea and vomiting that is not controlled by your nausea medication, call the clinic.   BELOW ARE SYMPTOMS THAT SHOULD BE REPORTED IMMEDIATELY:  *FEVER GREATER THAN 100.5 F  *CHILLS WITH OR WITHOUT FEVER  NAUSEA AND VOMITING THAT IS NOT CONTROLLED WITH YOUR NAUSEA MEDICATION  *UNUSUAL SHORTNESS OF BREATH  *UNUSUAL BRUISING OR BLEEDING  TENDERNESS IN MOUTH AND THROAT WITH OR WITHOUT PRESENCE OF ULCERS  *URINARY PROBLEMS  *BOWEL PROBLEMS  UNUSUAL RASH Items with * indicate a potential emergency and should be followed up as soon as possible.  Feel free to call the clinic should you have any questions or concerns. The clinic phone number is [phone].  Please show the [facility] at check-in to the Emergency Department and triage nurse.  Coronavirus (COVID-19) Are you at risk?  Are you at risk for the Coronavirus (COVID-19)?  To be considered HIGH RISK for Coronavirus (COVID-19), you have to meet the following criteria:  . Traveled to [place], [place], [place], [place] or [place]; or in the [place] to [place], [place], [place], or [place]; and have fever, cough, and shortness of breath within the last 2 weeks of travel OR . Been in close contact with a person diagnosed with COVID-19 within the last 2 weeks and have fever, cough, and shortness of breath . IF YOU DO NOT MEET THESE CRITERIA, YOU ARE CONSIDERED LOW RISK FOR COVID-19.  What to do if you are HIGH RISK for COVID-19?  Marland Kitchen If you are having a medical emergency, call 911. . Seek medical care right away. Before you go to a doctor's office, urgent care or emergency  department, call ahead and tell them about your recent travel, contact with someone diagnosed with COVID-19, and your symptoms. You should receive instructions from your physician's office regarding next steps of care.  . When you arrive at healthcare provider, tell the healthcare staff immediately you have returned from visiting [place], [place], [place], [place] or [place]; or traveled in the [place] to [place], [place], [place], or [place]; in the last two weeks or you have been in close contact with a person diagnosed with COVID-19 in the last 2 weeks.   . Tell the health care staff about your symptoms: fever, cough and shortness of breath. . After you have been seen by a medical provider, you will be either: o Tested for (COVID-19) and discharged home on quarantine except to seek medical care if symptoms worsen, and asked to  - Stay home and avoid contact with others until you get your results (4-5 days)  - Avoid travel on public transportation if possible (such as bus, train, or airplane) or o Sent to the Emergency Department by EMS for evaluation, COVID-19 testing, and possible admission depending on your condition and test results.  What to do if you are LOW RISK for COVID-19?  Reduce your risk of any infection by using the same precautions used for avoiding the common cold or flu:  Marland Kitchen Wash your hands often with soap and warm water for at least 20 seconds.  If soap and water are not readily available, use an alcohol-based hand sanitizer with at least 60% alcohol.  Marland Kitchen  If coughing or sneezing, cover your mouth and nose by coughing or sneezing into the elbow areas of your shirt or coat, into a tissue or into your sleeve (not your hands). . Avoid shaking hands with others and consider head nods or verbal greetings only. . Avoid touching your eyes, nose, or mouth with unwashed hands.  . Avoid close contact with people who are sick. . Avoid places or events with large numbers of people  in one location, like concerts or sporting events. . Carefully consider travel plans you have or are making. . If you are planning any travel outside or inside the [place], visit the CDC's Travelers' Health webpage for the latest health notices. . If you have some symptoms but not all symptoms, continue to monitor at home and seek medical attention if your symptoms worsen. . If you are having a medical emergency, call 911.   ADDITIONAL HEALTHCARE OPTIONS FOR PATIENTS  [facility] / e-Visit: [email or web address]         [facility]: [phone]  [facility]: [phone]                   [facility]: [phone]   

## 2019-11-08 NOTE — Progress Notes (Signed)
Blood return noted before, during, and after Fluorouracil IV push. Pt. tolerated well.

## 2019-11-08 NOTE — Progress Notes (Addendum)
[facility] OFFICE PROGRESS NOTE   Diagnosis: Colon cancer  INTERVAL HISTORY:   Joe Garcia returns as scheduled.  He completed cycle 5 FOLFOX 10/25/2019.  He has occasional nausea.  No vomiting.  No mouth sores.  He has periodic diarrhea.  Cold sensitivity typically lasts about 3 days.  No persistent neuropathy symptoms.  Objective:  Vital signs in last 24 hours:  Blood pressure (!) 156/72, pulse 60, temperature 98.3 F (36.8 C), temperature source Temporal, resp. rate 18, height '5\' 11"'  (1.803 m), weight 159 lb 1.6 oz (72.2 kg), SpO2 100 %.    HEENT: No thrush or ulcers. GI: Abdomen soft and nontender.  No hepatomegaly. Vascular: No leg edema. Neuro: Vibratory sense mildly decreased over the fingertips per tuning fork exam. Skin: Palms with hyperpigmentation, no erythema. Port-A-Cath without erythema.   Lab Results:  Lab Results  Component Value Date   WBC 5.9 11/08/2019   HGB 11.5 (L) 11/08/2019   HCT 35.8 (L) 11/08/2019   MCV 88.0 11/08/2019   PLT 188 11/08/2019   NEUTROABS 3.0 11/08/2019    Imaging:  CT Abdomen Pelvis W Contrast  Result Date: 11/07/2019 CLINICAL DATA:  Follow-up positive mesenteric lymph node margin EXAM: CT ABDOMEN AND PELVIS WITH CONTRAST TECHNIQUE: Multidetector CT imaging of the abdomen and pelvis was performed using the standard protocol following bolus administration of intravenous contrast. CONTRAST:  123m OMNIPAQUE IOHEXOL 300 MG/ML  SOLN COMPARISON:  06/26/2019 FINDINGS: Lower chest: Incidental imaging of the lung bases is normal. No pleural effusion. No consolidation. Hepatobiliary: No focal, suspicious hepatic lesion. Portal vein is patent. Pancreas: Pancreas is normal without lesion, ductal dilation or inflammation. Spleen: Spleen normal size without focal lesion. Adrenals/Urinary Tract: Adrenal glands are normal. [place] right kidney is unchanged. Renal cortical scarring associated with the left kidney is a stable finding  without suspicious mass or hydronephrosis. Urinary bladder is unremarkable. Stomach/Bowel: Evidence of colonic resection and anastomosis. No acute gastrointestinal process. No Peri anastomotic thickening. Normal appendix. Vascular/Lymphatic: Patent abdominal vasculature. No adenopathy. Calcified atherosclerotic changes of the abdominal aorta. No pelvic adenopathy. Reproductive: Prostate normal by CT. Not well assessed. Other: No ascites. Musculoskeletal: No acute bone finding. No destructive bone process. Marked degenerative changes of bilateral hips. IMPRESSION: 1. Postoperative changes related to colectomy and anastomosis for colonic neoplasm. No current evidence of disease. 2. Aortic atherosclerosis. Aortic Atherosclerosis (ICD10-I70.0). Electronically Signed   By: GZetta BillsM.D.   On: 11/07/2019 17:49    Medications: I have reviewed the patient's current medications.  Assessment/Plan: 1. Colon cancer, transverse colon,stage IIIc, [id number] status post a partial transverse colectomy 06/30/2019 ? Grade 3, lymphovascular and perineural invasion present, 4/18 lymph nodes positive, positive lymph nodes mesenteric margin, tumor invades into adherent omentum, MSI high,loss of MLH1 and PMS2 expression ? CT abdomen/pelvis 06/26/2019-thickening within the distal transverse colon, severely atrophic and calcified right kidney ? CT chest 06/28/2019-no evidence of metastatic disease, stable 3 mm right lower lobe nodule ? Cycle 1 FOLFOX 08/04/2019 (oxaliplatin 65 mg/m due to renal function) ? Plan for repeat CTs after he has completed 5 cycles of chemotherapy ? Cycle 2 FOLFOX 08/18/2019 ? Cycle 3 FOLFOX 08/31/2019, Udenyca added ? Cycle 4 FOLFOX 10/12/2019 ? Cycle 5 FOLFOX 10/25/2019 ? Stable right lower lobe nodule on chest CT 09/21/2019 ? CT abdomen/pelvis 11/07/2019-postoperative changes related to colectomy and anastomosis.  No current evidence of disease.  No adenopathy. ? Cycle 6 FOLFOX  11/08/2019 2. Asthma 3. Chronic systolic heart failure 4. History of PVCs 5.  Atrophic right kidney 6. Microcytic anemia, likely iron deficiency anemia secondary to #1 7. Port-A-Cath placement on 12/15/2019, Dr. Rosendo Gros 8. COVID-19 infection 09/21/2019-treated with bamlanivimab   Disposition: Mr. Crotty appears stable.  He has completed 5 cycles of FOLFOX.  He continues to tolerate chemotherapy well.  Plan to proceed with cycle 6 today as scheduled.  We reviewed the labs from today, adequate to proceed with treatment.  We reviewed the recent CT results.  There is no evidence of recurrent disease.  He will return for lab, follow-up, cycle 7 FOLFOX in 2 weeks.  He will contact the office in the interim with any problems.  Patient seen with Dr. [name].      [name] ANP/GNP-BC   11/08/2019  10:53 AM This was a shared visit with [name].  Mr. Haberle continues to tolerate the chemotherapy well.  The restaging CT reveals no evidence of disease progression.  He will continue FOLFOX.  Julieanne Manson, MD

## 2019-11-10 ENCOUNTER — Telehealth: Payer: Self-pay | Admitting: Nurse Practitioner

## 2019-11-10 ENCOUNTER — Inpatient Hospital Stay: Payer: Medicare Other

## 2019-11-10 ENCOUNTER — Other Ambulatory Visit (HOSPITAL_COMMUNITY): Payer: Self-pay

## 2019-11-10 ENCOUNTER — Other Ambulatory Visit: Payer: Self-pay

## 2019-11-10 VITALS — BP 142/68 | HR 68 | Temp 98.2°F | Resp 18

## 2019-11-10 DIAGNOSIS — C184 Malignant neoplasm of transverse colon: Secondary | ICD-10-CM

## 2019-11-10 DIAGNOSIS — Z5111 Encounter for antineoplastic chemotherapy: Secondary | ICD-10-CM | POA: Diagnosis not present

## 2019-11-10 MED ORDER — PEGFILGRASTIM-CBQV 6 MG/0.6ML ~~LOC~~ SOSY
6.0000 mg | PREFILLED_SYRINGE | Freq: Once | SUBCUTANEOUS | Status: AC
  Administered 2019-11-10: 6 mg via SUBCUTANEOUS

## 2019-11-10 MED ORDER — SODIUM CHLORIDE 0.9% FLUSH
10.0000 mL | INTRAVENOUS | Status: DC | PRN
  Administered 2019-11-10: 10 mL
  Filled 2019-11-10: qty 10

## 2019-11-10 MED ORDER — HEPARIN SOD (PORK) LOCK FLUSH 100 UNIT/ML IV SOLN
500.0000 [IU] | Freq: Once | INTRAVENOUS | Status: AC | PRN
  Administered 2019-11-10: 500 [IU]
  Filled 2019-11-10: qty 5

## 2019-11-10 NOTE — Telephone Encounter (Signed)
Scheduled per los. Called and spoke with patient. Confirmed appt 

## 2019-11-10 NOTE — Progress Notes (Signed)
Paramedicine Encounter    Patient ID: Joe Garcia, male    DOB: 1951-04-12, 69 y.o.   MRN: [id number]   Patient Care Team: Medicine, [facility] as PCP - General [name], LCSW as Social Worker (Licensed Clinical Social Worker)  Patient Active Problem List   Diagnosis Date Noted  . Port-A-Cath in place 08/04/2019  . Chronic systolic heart failure ([place])   . Colonic obstruction ([place])   . 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 Nausea & vomiting 06/26/2019  . Colonic mass 06/26/2019  . COPD GOLD ?  11/24/2018  . Chest pain   . NSTEMI (non-ST elevated myocardial infarction) ([employer]) 10/03/2018  . COPD with acute exacerbation ([place]) 07/26/2018  . ARF (acute renal failure) ([place]) 07/26/2018  . Nausea and vomiting 07/26/2018  . Weight loss, unintentional 07/26/2018  . Malnutrition of moderate degree 07/16/2017  . 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 Solitary kidney, congenital 06/23/2017  . Nonischemic cardiomyopathy ([place])   . Multifocal PVCs   . Chronic combined systolic (congestive) and diastolic (congestive) heart failure ([place]) 06/11/2017  . Hypokalemia 05/05/2017  . DOE (dyspnea on exertion) 07/16/2013  . Alcohol abuse 09/25/2012  . Alcohol dependence ([place]) 09/22/2012  . Essential hypertension 03/22/2007  . DEGENERATIVE DISC DISEASE 03/22/2007  . AVASCULAR NECROSIS 03/22/2007    Current Outpatient Medications:  .  acetaminophen (TYLENOL) 500 MG tablet, Take 2 tablets (1,000 mg total) by mouth 3 (three) times daily as needed for headache (pain)., Disp: 30 tablet, Rfl: 0 .  albuterol (PROVENTIL) (2.5 MG/3ML) 0.083% nebulizer solution, Take 3 mLs (2.5 mg total) by nebulization every 6 (six) hours as needed for wheezing or shortness of breath., Disp: 75 mL, Rfl: 0 .  albuterol (VENTOLIN HFA) 108 (90 Base) MCG/ACT inhaler, Inhale 2 puffs into the lungs every 4 (four) hours as needed for wheezing or shortness of breath., Disp:  18 g, Rfl: 0 .  amiodarone (PACERONE) 100 MG tablet, Take 1 tablet (100 mg total) by mouth daily., Disp: 30 tablet, Rfl: 6 .  budesonide-formoterol (SYMBICORT) 160-4.5 MCG/ACT inhaler, Inhale 2 puffs into the lungs 2 (two) times daily., Disp: 1 Inhaler, Rfl: 11 .  carvedilol (COREG) 3.125 MG tablet, Take 1 tablet (3.125 mg total) by mouth 2 (two) times daily with a meal., Disp: 60 tablet, Rfl: 5 .  fluticasone (FLONASE) 50 MCG/ACT nasal spray, Place 2 sprays into both nostrils daily., Disp: 16 g, Rfl: 0 .  furosemide (LASIX) 20 MG tablet, Take 1 tablet (20 mg total) by mouth daily., Disp: 30 tablet, Rfl: 5 .  isosorbide-hydrALAZINE (BIDIL) 20-37.5 MG tablet, Take 0.5 tablets by mouth 3 (three) times daily. , Disp: , Rfl:  .  lidocaine-prilocaine (EMLA) cream, Apply to portacath site 1-2 hours prior to use (Patient taking differently: Apply 1 application topically See admin instructions. Apply to portacath site 1-2 hours prior to use), Disp: 30 g, Rfl: 3 .  loratadine (CLARITIN) 10 MG tablet, Take 10 mg by mouth daily., Disp: , Rfl:  .  losartan (COZAAR) 25 MG tablet, TAKE 1 TABLET BY MOUTH EVERY DAY (Patient taking differently: Take 25 mg by mouth at bedtime. ), Disp: 30 tablet, Rfl: 6 .  montelukast (SINGULAIR) 10 MG tablet, Take 10 mg by mouth at bedtime., Disp: , Rfl:  .  prochlorperazine (COMPAZINE) 10 MG tablet, Take 10 mg by mouth every 6 (six) hours as needed for nausea or vomiting., Disp: , Rfl:  .  spironolactone (  ALDACTONE) 25 MG tablet, TAKE 1 TABLET(25 MG) BY MOUTH DAILY, Disp: 30 tablet, Rfl: 3 Allergies  Allergen Reactions  . Lisinopril Swelling    Facial and tongue swelling 10/2012      Social History   Socioeconomic History  . Marital status: Single    Spouse name: Not on file  . Number of children: Not on file  . Years of education: Not on file  . Highest education level: Not on file  Occupational History  . Occupation: "it don't work for me", on disability  Tobacco Use   . Smoking status: Former Smoker    Packs/day: 0.10    Years: 45.00    Pack years: 4.50    Quit date: 2012    Years since quitting: 9.2  . Smokeless tobacco: Never Used  Substance and Sexual Activity  . Alcohol use: No    Comment: Pt states no etoh since April 2014  . Drug use: No    Comment: Prior use of crack cocaine, quit 2012  . Sexual activity: Never  Other Topics Concern  . Not on file  Social History Narrative  . Not on file   Social Determinants of Health   Financial Resource Strain:   . Difficulty of Paying Living Expenses:   Food Insecurity:   . Worried About Charity fundraiser in the Last Year:   . Arboriculturist in the Last Year:   Transportation Needs:   . Film/video editor (Medical):   Marland Kitchen Lack of Transportation (Non-Medical):   Physical Activity:   . Days of Exercise per Week:   . Minutes of Exercise per Session:   Stress:   . Feeling of Stress :   Social Connections:   . Frequency of Communication with Friends and Family:   . Frequency of Social Gatherings with Friends and Family:   . Attends Religious Services:   . Active Member of Clubs or Organizations:   . Attends Archivist Meetings:   Marland Kitchen Marital Status:   Intimate Partner Violence:   . Fear of Current or Ex-Partner:   . Emotionally Abused:   Marland Kitchen Physically Abused:   . Sexually Abused:     Physical Exam      Future Appointments  Date Time Provider [place]  11/10/2019  2:00 PM [facility] FL[facility] None    BP (!) 162/92   Pulse 64   Temp 98.7 F (37.1 C)   Resp 16   SpO2 98%   Weight yesterday-159 Last visit weight-154  Pt reports feeling ok this morning, last night his stomach hurt and this morning his stomach was hurting. He ate [employer] chicken last night for dinner. I told him to avoid those greasy foods at all possible.  Pt reports breathing doing same. He filled pill box-there was 2 errors in there but that was fixed.  Denies dizziness, he does  get congestion in his head but takes allergy pills for that. Helps some.  He didn't take his meds last night due to stomach pains and hadnt taken anything this morning-he just got up and hadnt ate yet. hes got wheezing to his lungs but hadnt had breathing tx yet either this morning.  He will do that after I leave.  Waiting on his income statement to arrive-his daughter requested that last week.  Marylouise Stacks, [facility] Paramedic  11/10/19

## 2019-11-10 NOTE — Patient Instructions (Signed)

## 2019-11-14 ENCOUNTER — Telehealth: Payer: Self-pay | Admitting: *Deleted

## 2019-11-14 NOTE — Telephone Encounter (Signed)
Called patient to f/u on his call to Access Nurse over the weekend re: diarrhea. He reports he has loose stool every time he eats something. Was taking pepto bismol and imodium liquid (not sure of dosing). Having a lot of gas today, but no BM yet. Instructed him to stick with clear liquids today and only advance diet to bland low fiber foods when diarrhea slows down. Begin taking Imodium tablets #2 qid today. RN will call back tomorrow to check on him. He verbalized that he does not feel dehydrated at this time.

## 2019-11-15 ENCOUNTER — Telehealth: Payer: Self-pay | Admitting: *Deleted

## 2019-11-15 NOTE — Telephone Encounter (Signed)
Called to f/u on diarrhea. He reports he is feeling well and diarrhea has resolved.

## 2019-11-17 ENCOUNTER — Other Ambulatory Visit: Payer: Self-pay | Admitting: Oncology

## 2019-11-17 NOTE — Progress Notes (Signed)
Pharmacist Chemotherapy Monitoring - Follow Up Assessment    I verify that I have reviewed each item in the below checklist:  . Regimen for the patient is scheduled for the appropriate day and plan matches scheduled date. Marland Kitchen Appropriate non-routine labs are ordered dependent on drug ordered. . If applicable, additional medications reviewed and ordered per protocol based on lifetime cumulative doses and/or treatment regimen.   Plan for follow-up and/or issues identified: No . I-vent associated with next due treatment: No . MD and/or nursing notified: No  Joe Garcia 11/17/2019 8:04 AM

## 2019-11-23 ENCOUNTER — Inpatient Hospital Stay: Payer: Medicare Other

## 2019-11-23 ENCOUNTER — Encounter: Payer: Self-pay | Admitting: Nurse Practitioner

## 2019-11-23 ENCOUNTER — Inpatient Hospital Stay (HOSPITAL_BASED_OUTPATIENT_CLINIC_OR_DEPARTMENT_OTHER): Payer: Medicare Other | Admitting: Nurse Practitioner

## 2019-11-23 ENCOUNTER — Other Ambulatory Visit: Payer: Self-pay

## 2019-11-23 VITALS — BP 145/73 | HR 51 | Temp 98.7°F | Resp 17 | Ht 71.0 in | Wt 158.4 lb

## 2019-11-23 DIAGNOSIS — C184 Malignant neoplasm of transverse colon: Secondary | ICD-10-CM

## 2019-11-23 DIAGNOSIS — Z95828 Presence of other vascular implants and grafts: Secondary | ICD-10-CM

## 2019-11-23 DIAGNOSIS — Z5111 Encounter for antineoplastic chemotherapy: Secondary | ICD-10-CM | POA: Diagnosis not present

## 2019-11-23 LAB — CMP (CANCER CENTER ONLY)
ALT: 21 U/L (ref 0–44)
AST: 22 U/L (ref 15–41)
Albumin: 3.4 g/dL — ABNORMAL LOW (ref 3.5–5.0)
Alkaline Phosphatase: 132 U/L — ABNORMAL HIGH (ref 38–126)
Anion gap: 11 (ref 5–15)
BUN: 12 mg/dL (ref 8–23)
CO2: 26 mmol/L (ref 22–32)
Calcium: 8.9 mg/dL (ref 8.9–10.3)
Chloride: 106 mmol/L (ref 98–111)
Creatinine: 1.27 mg/dL — ABNORMAL HIGH (ref 0.61–1.24)
GFR, Est AFR Am: >60 mL/min (ref >60)
GFR, Estimated: 58 mL/min — ABNORMAL LOW (ref >60)
Glucose, Bld: 98 mg/dL (ref 70–99)
Potassium: 3.7 mmol/L (ref 3.5–5.1)
Sodium: 143 mmol/L (ref 135–145)
Total Bilirubin: 0.5 mg/dL (ref 0.3–1.2)
Total Protein: 6.2 g/dL — ABNORMAL LOW (ref 6.5–8.1)

## 2019-11-23 LAB — CBC WITH DIFFERENTIAL (CANCER CENTER ONLY)
Abs Immature Granulocytes: 0.11 10*3/uL — ABNORMAL HIGH (ref 0.00–0.07)
Basophils Absolute: 0.1 10*3/uL (ref 0.0–0.1)
Basophils Relative: 1 %
Eosinophils Absolute: 0.2 10*3/uL (ref 0.0–0.5)
Eosinophils Relative: 2 %
HCT: 36.3 % — ABNORMAL LOW (ref 39.0–52.0)
Hemoglobin: 11.7 g/dL — ABNORMAL LOW (ref 13.0–17.0)
Immature Granulocytes: 1 %
Lymphocytes Relative: 20 %
Lymphs Abs: 2.4 10*3/uL (ref 0.7–4.0)
MCH: 28.5 pg (ref 26.0–34.0)
MCHC: 32.2 g/dL (ref 30.0–36.0)
MCV: 88.5 fL (ref 80.0–100.0)
Monocytes Absolute: 1.2 10*3/uL — ABNORMAL HIGH (ref 0.1–1.0)
Monocytes Relative: 10 %
Neutro Abs: 8 10*3/uL — ABNORMAL HIGH (ref 1.7–7.7)
Neutrophils Relative %: 66 %
Platelet Count: 198 10*3/uL (ref 150–400)
RBC: 4.1 MIL/uL — ABNORMAL LOW (ref 4.22–5.81)
RDW: 17.7 % — ABNORMAL HIGH (ref 11.5–15.5)
WBC Count: 12 10*3/uL — ABNORMAL HIGH (ref 4.0–10.5)
nRBC: 0 % (ref 0.0–0.2)

## 2019-11-23 MED ORDER — PALONOSETRON HCL INJECTION 0.25 MG/5ML
INTRAVENOUS | Status: AC
  Filled 2019-11-23: qty 5

## 2019-11-23 MED ORDER — SODIUM CHLORIDE 0.9% FLUSH
10.0000 mL | INTRAVENOUS | Status: DC | PRN
  Filled 2019-11-23: qty 10

## 2019-11-23 MED ORDER — PALONOSETRON HCL INJECTION 0.25 MG/5ML
0.2500 mg | Freq: Once | INTRAVENOUS | Status: AC
  Administered 2019-11-23: 13:00:00 0.25 mg via INTRAVENOUS

## 2019-11-23 MED ORDER — LEUCOVORIN CALCIUM INJECTION 350 MG
400.0000 mg/m2 | Freq: Once | INTRAVENOUS | Status: AC
  Administered 2019-11-23: 14:00:00 732 mg via INTRAVENOUS
  Filled 2019-11-23: qty 17.5

## 2019-11-23 MED ORDER — FLUOROURACIL CHEMO INJECTION 2.5 GM/50ML
400.0000 mg/m2 | Freq: Once | INTRAVENOUS | Status: AC
  Administered 2019-11-23: 16:00:00 750 mg via INTRAVENOUS
  Filled 2019-11-23: qty 15

## 2019-11-23 MED ORDER — OXALIPLATIN CHEMO INJECTION 100 MG/20ML
65.0000 mg/m2 | Freq: Once | INTRAVENOUS | Status: AC
  Administered 2019-11-23: 14:00:00 120 mg via INTRAVENOUS
  Filled 2019-11-23: qty 20

## 2019-11-23 MED ORDER — HEPARIN SOD (PORK) LOCK FLUSH 100 UNIT/ML IV SOLN
500.0000 [IU] | Freq: Once | INTRAVENOUS | Status: DC | PRN
  Filled 2019-11-23: qty 5

## 2019-11-23 MED ORDER — DEXTROSE 5 % IV SOLN
Freq: Once | INTRAVENOUS | Status: AC
  Filled 2019-11-23: qty 250

## 2019-11-23 MED ORDER — SODIUM CHLORIDE 0.9 % IV SOLN
2400.0000 mg/m2 | INTRAVENOUS | Status: DC
  Administered 2019-11-23: 16:00:00 4400 mg via INTRAVENOUS
  Filled 2019-11-23: qty 88

## 2019-11-23 MED ORDER — SODIUM CHLORIDE 0.9% FLUSH
10.0000 mL | Freq: Once | INTRAVENOUS | Status: AC
  Administered 2019-11-23: 12:00:00 10 mL
  Filled 2019-11-23: qty 10

## 2019-11-23 MED ORDER — SODIUM CHLORIDE 0.9 % IV SOLN
10.0000 mg | Freq: Once | INTRAVENOUS | Status: AC
  Administered 2019-11-23: 13:00:00 10 mg via INTRAVENOUS
  Filled 2019-11-23: qty 10

## 2019-11-23 NOTE — Progress Notes (Signed)
  [facility] OFFICE PROGRESS NOTE   Diagnosis: Colon cancer  INTERVAL HISTORY:   Joe Garcia returns as scheduled.  He completed cycle 6 FOLFOX 11/08/2019.  He denies nausea/vomiting.  No mouth sores.  Cold sensitivity lasted about 4 days.  No persistent neuropathy symptoms.  He developed diarrhea on day 3, lasting about 4 days.  When he began Imodium the diarrhea resolved.  Objective:  Vital signs in last 24 hours:  Blood pressure (!) 145/73, pulse (!) 51, temperature 98.7 F (37.1 C), temperature source Temporal, resp. rate 17, height '5\' 11"'$  (1.803 m), weight 158 lb 6.4 oz (71.8 kg), SpO2 99 %.    HEENT: No thrush or ulcers. GI: Abdomen soft and nontender.  No hepatomegaly. Vascular: No leg edema. Neuro: Vibratory sense minimally decreased to intact over the fingertips per tuning fork exam. Skin: Palms dry, hyperpigmented. Port-A-Cath without erythema.   Lab Results:  Lab Results  Component Value Date   WBC 12.0 (H) 11/23/2019   HGB 11.7 (L) 11/23/2019   HCT 36.3 (L) 11/23/2019   MCV 88.5 11/23/2019   PLT 198 11/23/2019   NEUTROABS 8.0 (H) 11/23/2019    Imaging:  No results found.  Medications: I have reviewed the patient's current medications.  Assessment/Plan: 1. 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 Grade 3, lymphovascular and perineural invasion present, 4/18 lymph nodes positive, positive lymph nodes mesenteric margin, tumor invades into adherent omentum, MSI high,loss of MLH1 and PMS2 expression ? CT abdomen/pelvis 06/26/2019-thickening within the distal transverse colon, severely atrophic and calcified right kidney ? CT chest 06/28/2019-no evidence of metastatic disease, stable 3 mm right lower lobe nodule ? Cycle 1 FOLFOX 08/04/2019 (oxaliplatin 65 mg/m due to renal function) ? Plan for repeat CTs after he has completed 5 cycles of chemotherapy ? Cycle 2 FOLFOX  08/18/2019 ? Cycle 3 FOLFOX 08/31/2019, Udenyca added ? Cycle 4 FOLFOX 10/12/2019 ? Cycle 5 FOLFOX 10/25/2019 ? Stable right lower lobe nodule on chest CT 09/21/2019 ? CT abdomen/pelvis 11/07/2019-postoperative changes related to colectomy and anastomosis.  No current evidence of disease.  No adenopathy. ? Cycle 6 FOLFOX 11/08/2019 ? Cycle 7 FOLFOX 11/23/2019 2. Asthma 3. 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 Microcytic anemia, likely iron deficiency anemia secondary to #1 7. Port-A-Cath placement on 12/15/2019, Dr. Rosendo Gros 8. COVID-19 infection 09/21/2019-treated with bamlanivimab   Disposition: Joe Garcia appears stable.  He has completed 6 cycles of FOLFOX.  Plan to proceed with cycle 7 today as scheduled.  We reviewed the CBC from today.  Counts adequate to proceed with treatment.  He had diarrhea after cycle 6.  The diarrhea responded to Imodium.  He understands to contact the office with poorly controlled diarrhea.  He will return for lab, follow-up, cycle 8 FOLFOX in 2 weeks.  He will contact the office in the interim as outlined above or with any other problems.    [name] ANP/GNP-BC   11/23/2019  12:03 PM

## 2019-11-23 NOTE — Patient Instructions (Signed)
[facility] Discharge Instructions for Patients Receiving Chemotherapy  Today you received the following chemotherapy agents: Oxaliplatin/Leucovorin/Adrucil.  To help prevent nausea and vomiting after your treatment, we encourage you to take your nausea medication as directed.    If you develop nausea and vomiting that is not controlled by your nausea medication, call the clinic.   BELOW ARE SYMPTOMS THAT SHOULD BE REPORTED IMMEDIATELY:  *FEVER GREATER THAN 100.5 F  *CHILLS WITH OR WITHOUT FEVER  NAUSEA AND VOMITING THAT IS NOT CONTROLLED WITH YOUR NAUSEA MEDICATION  *UNUSUAL SHORTNESS OF BREATH  *UNUSUAL BRUISING OR BLEEDING  TENDERNESS IN MOUTH AND THROAT WITH OR WITHOUT PRESENCE OF ULCERS  *URINARY PROBLEMS  *BOWEL PROBLEMS  UNUSUAL RASH Items with * indicate a potential emergency and should be followed up as soon as possible.  Feel free to call the clinic should you have any questions or concerns. The clinic phone number is [phone].  Please show the CHEMO ALERT CARD at check-in to the Emergency Department and triage nurse.   

## 2019-11-23 NOTE — Patient Instructions (Signed)

## 2019-11-24 ENCOUNTER — Other Ambulatory Visit (HOSPITAL_COMMUNITY): Payer: Self-pay

## 2019-11-24 NOTE — Progress Notes (Signed)
Paramedicine Encounter    Patient ID: Joe Garcia, male    DOB: 1951/02/06, 69 y.o.   MRN: [id number]   Patient Care Team: Medicine, [facility] as PCP - General [name], LCSW as Social Worker (Licensed Clinical Social Worker)  Patient Active Problem List   Diagnosis Date Noted  . Port-A-Cath in place 08/04/2019  . Chronic systolic heart failure ([place])   . Colonic obstruction ([place])   . 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 Nausea & vomiting 06/26/2019  . Colonic mass 06/26/2019  . COPD GOLD ?  11/24/2018  . Chest pain   . NSTEMI (non-ST elevated myocardial infarction) ([place]) 10/03/2018  . COPD with acute exacerbation ([place]) 07/26/2018  . ARF (acute renal failure) ([place]) 07/26/2018  . Nausea and vomiting 07/26/2018  . Weight loss, unintentional 07/26/2018  . Malnutrition of moderate degree 07/16/2017  . 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 Solitary kidney, congenital 06/23/2017  . Nonischemic cardiomyopathy ([place])   . Multifocal PVCs   . Chronic combined systolic (congestive) and diastolic (congestive) heart failure ([place]) 06/11/2017  . Hypokalemia 05/05/2017  . DOE (dyspnea on exertion) 07/16/2013  . Alcohol abuse 09/25/2012  . Alcohol dependence ([place]) 09/22/2012  . Essential hypertension 03/22/2007  . DEGENERATIVE DISC DISEASE 03/22/2007  . AVASCULAR NECROSIS 03/22/2007    Current Outpatient Medications:  .  acetaminophen (TYLENOL) 500 MG tablet, Take 2 tablets (1,000 mg total) by mouth 3 (three) times daily as needed for headache (pain)., Disp: 30 tablet, Rfl: 0 .  albuterol (PROVENTIL) (2.5 MG/3ML) 0.083% nebulizer solution, Take 3 mLs (2.5 mg total) by nebulization every 6 (six) hours as needed for wheezing or shortness of breath., Disp: 75 mL, Rfl: 0 .  albuterol (VENTOLIN HFA) 108 (90 Base) MCG/ACT inhaler, Inhale 2 puffs into the lungs every 4 (four) hours as needed for wheezing or shortness of breath., Disp:  18 g, Rfl: 0 .  amiodarone (PACERONE) 100 MG tablet, Take 1 tablet (100 mg total) by mouth daily., Disp: 30 tablet, Rfl: 6 .  budesonide-formoterol (SYMBICORT) 160-4.5 MCG/ACT inhaler, Inhale 2 puffs into the lungs 2 (two) times daily., Disp: 1 Inhaler, Rfl: 11 .  carvedilol (COREG) 3.125 MG tablet, Take 1 tablet (3.125 mg total) by mouth 2 (two) times daily with a meal., Disp: 60 tablet, Rfl: 5 .  fluticasone (FLONASE) 50 MCG/ACT nasal spray, Place 2 sprays into both nostrils daily., Disp: 16 g, Rfl: 0 .  furosemide (LASIX) 20 MG tablet, Take 1 tablet (20 mg total) by mouth daily., Disp: 30 tablet, Rfl: 5 .  isosorbide-hydrALAZINE (BIDIL) 20-37.5 MG tablet, Take 0.5 tablets by mouth 3 (three) times daily. , Disp: , Rfl:  .  lidocaine-prilocaine (EMLA) cream, Apply to portacath site 1-2 hours prior to use (Patient taking differently: Apply 1 application topically See admin instructions. Apply to portacath site 1-2 hours prior to use), Disp: 30 g, Rfl: 3 .  loperamide (IMODIUM) 2 MG capsule, Take 2-4 mg by mouth 4 (four) times daily as needed., Disp: , Rfl:  .  loratadine (CLARITIN) 10 MG tablet, Take 10 mg by mouth daily., Disp: , Rfl:  .  losartan (COZAAR) 25 MG tablet, TAKE 1 TABLET BY MOUTH EVERY DAY (Patient taking differently: Take 25 mg by mouth at bedtime. ), Disp: 30 tablet, Rfl: 6 .  montelukast (SINGULAIR) 10 MG tablet, Take 10 mg by mouth at bedtime., Disp: , Rfl:  .  prochlorperazine (COMPAZINE) 10 MG tablet,  Take 10 mg by mouth every 6 (six) hours as needed for nausea or vomiting., Disp: , Rfl:  .  spironolactone (ALDACTONE) 25 MG tablet, TAKE 1 TABLET(25 MG) BY MOUTH DAILY, Disp: 30 tablet, Rfl: 3 Allergies  Allergen Reactions  . Lisinopril Swelling    Facial and tongue swelling 10/2012      Social History   Socioeconomic History  . Marital status: Single    Spouse name: Not on file  . Number of children: Not on file  . Years of education: Not on file  . Highest education  level: Not on file  Occupational History  . Occupation: "it don't work for me", on disability  Tobacco Use  . Smoking status: Former Smoker    Packs/day: 0.10    Years: 45.00    Pack years: 4.50    Quit date: 2012    Years since quitting: 9.3  . Smokeless tobacco: Never Used  Substance and Sexual Activity  . Alcohol use: No    Comment: Pt states no etoh since April 2014  . Drug use: No    Comment: Prior use of crack cocaine, quit 2012  . Sexual activity: Never  Other Topics Concern  . Not on file  Social History Narrative  . Not on file   Social Determinants of Health   Financial Resource Strain:   . Difficulty of Paying Living Expenses:   Food Insecurity:   . Worried About Charity fundraiser in the Last Year:   . Arboriculturist in the Last Year:   Transportation Needs:   . Film/video editor (Medical):   Marland Kitchen Lack of Transportation (Non-Medical):   Physical Activity:   . Days of Exercise per Week:   . Minutes of Exercise per Session:   Stress:   . Feeling of Stress :   Social Connections:   . Frequency of Communication with Friends and Family:   . Frequency of Social Gatherings with Friends and Family:   . Attends Religious Services:   . Active Member of Clubs or Organizations:   . Attends Archivist Meetings:   Marland Kitchen Marital Status:   Intimate Partner Violence:   . Fear of Current or Ex-Partner:   . Emotionally Abused:   Marland Kitchen Physically Abused:   . Sexually Abused:     Physical Exam      Future Appointments  Date Time Provider [place]  11/25/2019  2:15 PM [facility] [facility] None  12/06/2019 10:15 AM [facility] None  12/06/2019 10:30 AM [facility] [place] [facility] None  12/06/2019 11:00 AM Ladell Pier, MD [facility] None  12/06/2019 11:45 AM [facility] INFUSION [facility] None  12/08/2019 10:45 AM [facility] None    BP (!) 160/80   Pulse 68   Temp 98.7 F (37.1 C)   Resp 16    Wt 148 lb (67.1 kg)   SpO2 98%   BMI 20.64 kg/m   Weight yesterday-?? Last visit weight-159  Pt reports he is doing much better. He reports having diarrhea for about 3-4 days last week. He states he had vomiting once and nausea, poor appetite, but appetite better today. He was not able to take his meds during that time either.  He is on chemo for his cancer.  He reports his scan came back clean.  We moved his meds to [facility] for bubble packs. He is out of the carvedilol and spiro for 2 days and pharmacy said he  would be able to deliver tomor. For now he will work out of the bottles until the delivery meds.   B/p elevated-he is due for his afternoon dose of bidil    Marylouise Stacks, [place] Paramedic  11/24/19

## 2019-11-25 ENCOUNTER — Other Ambulatory Visit: Payer: Self-pay

## 2019-11-25 ENCOUNTER — Inpatient Hospital Stay: Payer: Medicare Other

## 2019-11-25 VITALS — BP 148/81 | HR 65 | Temp 98.9°F | Resp 18

## 2019-11-25 DIAGNOSIS — C184 Malignant neoplasm of transverse colon: Secondary | ICD-10-CM

## 2019-11-25 DIAGNOSIS — Z5111 Encounter for antineoplastic chemotherapy: Secondary | ICD-10-CM | POA: Diagnosis not present

## 2019-11-25 MED ORDER — HEPARIN SOD (PORK) LOCK FLUSH 100 UNIT/ML IV SOLN
500.0000 [IU] | Freq: Once | INTRAVENOUS | Status: AC | PRN
  Administered 2019-11-25: 500 [IU]
  Filled 2019-11-25: qty 5

## 2019-11-25 MED ORDER — PEGFILGRASTIM-CBQV 6 MG/0.6ML ~~LOC~~ SOSY
6.0000 mg | PREFILLED_SYRINGE | Freq: Once | SUBCUTANEOUS | Status: AC
  Administered 2019-11-25: 6 mg via SUBCUTANEOUS

## 2019-11-25 MED ORDER — PEGFILGRASTIM-CBQV 6 MG/0.6ML ~~LOC~~ SOSY
PREFILLED_SYRINGE | SUBCUTANEOUS | Status: AC
  Filled 2019-11-25: qty 0.6

## 2019-11-25 MED ORDER — SODIUM CHLORIDE 0.9% FLUSH
10.0000 mL | INTRAVENOUS | Status: DC | PRN
  Administered 2019-11-25: 10 mL
  Filled 2019-11-25: qty 10

## 2019-11-25 NOTE — Patient Instructions (Signed)
Pegfilgrastim injection What is this medicine? PEGFILGRASTIM (PEG fil gra stim) is a long-acting granulocyte colony-stimulating factor that stimulates the growth of neutrophils, a type of white blood cell important in the body's fight against infection. It is used to reduce the incidence of fever and infection in patients with certain types of cancer who are receiving chemotherapy that affects the bone marrow, and to increase survival after being exposed to high doses of radiation. This medicine may be used for other purposes; ask your health care provider or pharmacist if you have questions. COMMON BRAND NAME(S): Fulphila, Neulasta, UDENYCA, Ziextenzo What should I tell my health care provider before I take this medicine? They need to know if you have any of these conditions:  kidney disease  latex allergy  ongoing radiation therapy  sickle cell disease  skin reactions to acrylic adhesives (On-Body Injector only)  an unusual or allergic reaction to pegfilgrastim, filgrastim, other medicines, foods, dyes, or preservatives  pregnant or trying to get pregnant  breast-feeding How should I use this medicine? This medicine is for injection under the skin. If you get this medicine at home, you will be taught how to prepare and give the pre-filled syringe or how to use the On-body Injector. Refer to the patient Instructions for Use for detailed instructions. Use exactly as directed. Tell your healthcare provider immediately if you suspect that the On-body Injector may not have performed as intended or if you suspect the use of the On-body Injector resulted in a missed or partial dose. It is important that you put your used needles and syringes in a special sharps container. Do not put them in a trash can. If you do not have a sharps container, call your pharmacist or healthcare provider to get one. Talk to your pediatrician regarding the use of this medicine in children. While this drug may be  prescribed for selected conditions, precautions do apply. Overdosage: If you think you have taken too much of this medicine contact a poison control center or emergency room at once. NOTE: This medicine is only for you. Do not share this medicine with others. What if I miss a dose? It is important not to miss your dose. Call your doctor or health care professional if you miss your dose. If you miss a dose due to an On-body Injector failure or leakage, a new dose should be administered as soon as possible using a single prefilled syringe for manual use. What may interact with this medicine? Interactions have not been studied. Give your health care provider a list of all the medicines, herbs, non-prescription drugs, or dietary supplements you use. Also tell them if you smoke, drink alcohol, or use illegal drugs. Some items may interact with your medicine. This list may not describe all possible interactions. Give your health care provider a list of all the medicines, herbs, non-prescription drugs, or dietary supplements you use. Also tell them if you smoke, drink alcohol, or use illegal drugs. Some items may interact with your medicine. What should I watch for while using this medicine? You may need blood work done while you are taking this medicine. If you are going to need a MRI, CT scan, or other procedure, tell your doctor that you are using this medicine (On-Body Injector only). What side effects may I notice from receiving this medicine? Side effects that you should report to your doctor or health care professional as soon as possible:  allergic reactions like skin rash, itching or hives, swelling of the   face, lips, or tongue  back pain  dizziness  fever  pain, redness, or irritation at site where injected  pinpoint red spots on the skin  red or dark-brown urine  shortness of breath or breathing problems  stomach or side pain, or pain at the  shoulder  swelling  tiredness  trouble passing urine or change in the amount of urine Side effects that usually do not require medical attention (report to your doctor or health care professional if they continue or are bothersome):  bone pain  muscle pain This list may not describe all possible side effects. Call your doctor for medical advice about side effects. You may report side effects to FDA at 1-800-FDA-1088. Where should I keep my medicine? Keep out of the reach of children. If you are using this medicine at home, you will be instructed on how to store it. Throw away any unused medicine after the expiration date on the label. NOTE: This sheet is a summary. It may not cover all possible information. If you have questions about this medicine, talk to your doctor, pharmacist, or health care provider.  2020 Elsevier/Gold Standard (2017-10-19 16:57:08) Implanted Port Home Guide An implanted port is a device that is placed under the skin. It is usually placed in the chest. The device can be used to give IV medicine, to take blood, or for dialysis. You may have an implanted port if:  You need IV medicine that would be irritating to the small veins in your hands or arms.  You need IV medicines, such as antibiotics, for a long period of time.  You need IV nutrition for a long period of time.  You need dialysis. Having a port means that your health care provider will not need to use the veins in your arms for these procedures. You may have fewer limitations when using a port than you would if you used other types of long-term IVs, and you will likely be able to return to normal activities after your incision heals. An implanted port has two main parts:  Reservoir. The reservoir is the part where a needle is inserted to give medicines or draw blood. The reservoir is round. After it is placed, it appears as a small, raised area under your skin.  Catheter. The catheter is a thin,  flexible tube that connects the reservoir to a vein. Medicine that is inserted into the reservoir goes into the catheter and then into the vein. How is my port accessed? To access your port:  A numbing cream may be placed on the skin over the port site.  Your health care provider will put on a mask and sterile gloves.  The skin over your port will be cleaned carefully with a germ-killing soap and allowed to dry.  Your health care provider will gently pinch the port and insert a needle into it.  Your health care provider will check for a blood return to make sure the port is in the vein and is not clogged.  If your port needs to remain accessed to get medicine continuously (constant infusion), your health care provider will place a clear bandage (dressing) over the needle site. The dressing and needle will need to be changed every week, or as told by your health care provider. What is flushing? Flushing helps keep the port from getting clogged. Follow instructions from your health care provider about how and when to flush the port. Ports are usually flushed with saline solution or a medicine   called heparin. The need for flushing will depend on how the port is used:  If the port is only used from time to time to give medicines or draw blood, the port may need to be flushed: ? Before and after medicines have been given. ? Before and after blood has been drawn. ? As part of routine maintenance. Flushing may be recommended every 4-6 weeks.  If a constant infusion is running, the port may not need to be flushed.  Throw away any syringes in a disposal container that is meant for sharp items (sharps container). You can buy a sharps container from a pharmacy, or you can make one by using an empty hard plastic bottle with a cover. How long will my port stay implanted? The port can stay in for as long as your health care provider thinks it is needed. When it is time for the port to come out, a  surgery will be done to remove it. The surgery will be similar to the procedure that was done to put the port in. Follow these instructions at home:   Flush your port as told by your health care provider.  If you need an infusion over several days, follow instructions from your health care provider about how to take care of your port site. Make sure you: ? Wash your hands with soap and water before you change your dressing. If soap and water are not available, use alcohol-based hand sanitizer. ? Change your dressing as told by your health care provider. ? Place any used dressings or infusion bags into a plastic bag. Throw that bag in the trash. ? Keep the dressing that covers the needle clean and dry. Do not get it wet. ? Do not use scissors or sharp objects near the tube. ? Keep the tube clamped, unless it is being used.  Check your port site every day for signs of infection. Check for: ? Redness, swelling, or pain. ? Fluid or blood. ? Pus or a bad smell.  Protect the skin around the port site. ? Avoid wearing bra straps that rub or irritate the site. ? Protect the skin around your port from seat belts. Place a soft pad over your chest if needed.  Bathe or shower as told by your health care provider. The site may get wet as long as you are not actively receiving an infusion.  Return to your normal activities as told by your health care provider. Ask your health care provider what activities are safe for you.  Carry a medical alert card or wear a medical alert bracelet at all times. This will let health care providers know that you have an implanted port in case of an emergency. Get help right away if:  You have redness, swelling, or pain at the port site.  You have fluid or blood coming from your port site.  You have pus or a bad smell coming from the port site.  You have a fever. Summary  Implanted ports are usually placed in the chest for long-term IV access.  Follow  instructions from your health care provider about flushing the port and changing bandages (dressings).  Take care of the area around your port by avoiding clothing that puts pressure on the area, and by watching for signs of infection.  Protect the skin around your port from seat belts. Place a soft pad over your chest if needed.  Get help right away if you have a fever or you have redness,   swelling, pain, drainage, or a bad smell at the port site. This information is not intended to replace advice given to you by your health care provider. Make sure you discuss any questions you have with your health care provider. Document Revised: 11/05/2018 Document Reviewed: 08/16/2016 Elsevier Patient Education  2020 Elsevier Inc.  

## 2019-11-28 ENCOUNTER — Telehealth (HOSPITAL_COMMUNITY): Payer: Self-pay

## 2019-11-28 NOTE — Telephone Encounter (Signed)
Pt called me reporting his old phone broke and he has to go get another phone with another number.  He reports he got the bubble pack of meds and loves it and feels it is very simple to use and has all of his meds except the bidil b/c previous pharmacy filled a 90 day supply.  Updated number in chart.   Marylouise Stacks, EMT-Paramedic  11/28/19

## 2019-11-30 NOTE — Progress Notes (Signed)
Pharmacist Chemotherapy Monitoring - Follow Up Assessment    I verify that I have reviewed each item in the below checklist:  . Regimen for the patient is scheduled for the appropriate day and plan matches scheduled date. Marland Kitchen Appropriate non-routine labs are ordered dependent on drug ordered. . If applicable, additional medications reviewed and ordered per protocol based on lifetime cumulative doses and/or treatment regimen.   Plan for follow-up and/or issues identified: No . I-vent associated with next due treatment: No . MD and/or nursing notified: No  Joe Garcia,Joe Garcia 11/30/2019 4:40 PM

## 2019-12-04 ENCOUNTER — Other Ambulatory Visit: Payer: Self-pay | Admitting: Oncology

## 2019-12-06 ENCOUNTER — Inpatient Hospital Stay: Payer: Medicare Other | Attending: Nurse Practitioner | Admitting: Oncology

## 2019-12-06 ENCOUNTER — Other Ambulatory Visit: Payer: Self-pay

## 2019-12-06 ENCOUNTER — Inpatient Hospital Stay: Payer: Medicare Other

## 2019-12-06 VITALS — BP 154/80 | HR 51 | Resp 17 | Ht 71.0 in | Wt 157.4 lb

## 2019-12-06 DIAGNOSIS — Z8616 Personal history of COVID-19: Secondary | ICD-10-CM | POA: Diagnosis not present

## 2019-12-06 DIAGNOSIS — I5022 Chronic systolic (congestive) heart failure: Secondary | ICD-10-CM | POA: Insufficient documentation

## 2019-12-06 DIAGNOSIS — Z5189 Encounter for other specified aftercare: Secondary | ICD-10-CM | POA: Diagnosis not present

## 2019-12-06 DIAGNOSIS — N261 Atrophy of kidney (terminal): Secondary | ICD-10-CM | POA: Diagnosis not present

## 2019-12-06 DIAGNOSIS — Z9221 Personal history of antineoplastic chemotherapy: Secondary | ICD-10-CM | POA: Insufficient documentation

## 2019-12-06 DIAGNOSIS — C184 Malignant neoplasm of transverse colon: Secondary | ICD-10-CM | POA: Diagnosis present

## 2019-12-06 DIAGNOSIS — R197 Diarrhea, unspecified: Secondary | ICD-10-CM | POA: Insufficient documentation

## 2019-12-06 DIAGNOSIS — D509 Iron deficiency anemia, unspecified: Secondary | ICD-10-CM | POA: Insufficient documentation

## 2019-12-06 DIAGNOSIS — Z79899 Other long term (current) drug therapy: Secondary | ICD-10-CM | POA: Diagnosis not present

## 2019-12-06 DIAGNOSIS — Z5111 Encounter for antineoplastic chemotherapy: Secondary | ICD-10-CM | POA: Diagnosis present

## 2019-12-06 DIAGNOSIS — J45909 Unspecified asthma, uncomplicated: Secondary | ICD-10-CM | POA: Insufficient documentation

## 2019-12-06 DIAGNOSIS — Z95828 Presence of other vascular implants and grafts: Secondary | ICD-10-CM

## 2019-12-06 LAB — CMP (CANCER CENTER ONLY)
ALT: 22 U/L (ref 0–44)
AST: 22 U/L (ref 15–41)
Albumin: 3.5 g/dL (ref 3.5–5.0)
Alkaline Phosphatase: 131 U/L — ABNORMAL HIGH (ref 38–126)
Anion gap: 10 (ref 5–15)
BUN: 12 mg/dL (ref 8–23)
CO2: 23 mmol/L (ref 22–32)
Calcium: 9 mg/dL (ref 8.9–10.3)
Chloride: 107 mmol/L (ref 98–111)
Creatinine: 1.15 mg/dL (ref 0.61–1.24)
GFR, Est AFR Am: >60 mL/min (ref >60)
GFR, Estimated: >60 mL/min (ref >60)
Glucose, Bld: 93 mg/dL (ref 70–99)
Potassium: 3.7 mmol/L (ref 3.5–5.1)
Sodium: 140 mmol/L (ref 135–145)
Total Bilirubin: 0.6 mg/dL (ref 0.3–1.2)
Total Protein: 6.4 g/dL — ABNORMAL LOW (ref 6.5–8.1)

## 2019-12-06 LAB — CBC WITH DIFFERENTIAL (CANCER CENTER ONLY)
Abs Immature Granulocytes: 0.03 10*3/uL (ref 0.00–0.07)
Basophils Absolute: 0.1 10*3/uL (ref 0.0–0.1)
Basophils Relative: 1 %
Eosinophils Absolute: 0.2 10*3/uL (ref 0.0–0.5)
Eosinophils Relative: 4 %
HCT: 34 % — ABNORMAL LOW (ref 39.0–52.0)
Hemoglobin: 11.1 g/dL — ABNORMAL LOW (ref 13.0–17.0)
Immature Granulocytes: 1 %
Lymphocytes Relative: 31 %
Lymphs Abs: 2.1 10*3/uL (ref 0.7–4.0)
MCH: 28.7 pg (ref 26.0–34.0)
MCHC: 32.6 g/dL (ref 30.0–36.0)
MCV: 87.9 fL (ref 80.0–100.0)
Monocytes Absolute: 0.8 10*3/uL (ref 0.1–1.0)
Monocytes Relative: 12 %
Neutro Abs: 3.5 10*3/uL (ref 1.7–7.7)
Neutrophils Relative %: 51 %
Platelet Count: 165 10*3/uL (ref 150–400)
RBC: 3.87 MIL/uL — ABNORMAL LOW (ref 4.22–5.81)
RDW: 18.4 % — ABNORMAL HIGH (ref 11.5–15.5)
WBC Count: 6.7 10*3/uL (ref 4.0–10.5)
nRBC: 0 % (ref 0.0–0.2)

## 2019-12-06 MED ORDER — SODIUM CHLORIDE 0.9% FLUSH
10.0000 mL | Freq: Once | INTRAVENOUS | Status: DC
  Filled 2019-12-06: qty 10

## 2019-12-06 MED ORDER — OXALIPLATIN CHEMO INJECTION 100 MG/20ML
65.0000 mg/m2 | Freq: Once | INTRAVENOUS | Status: AC
  Administered 2019-12-06: 120 mg via INTRAVENOUS
  Filled 2019-12-06: qty 24

## 2019-12-06 MED ORDER — SODIUM CHLORIDE 0.9 % IV SOLN
2400.0000 mg/m2 | INTRAVENOUS | Status: DC
  Administered 2019-12-06: 4400 mg via INTRAVENOUS
  Filled 2019-12-06: qty 88

## 2019-12-06 MED ORDER — SODIUM CHLORIDE 0.9% FLUSH
10.0000 mL | INTRAVENOUS | Status: DC | PRN
  Administered 2019-12-06: 10 mL
  Filled 2019-12-06: qty 10

## 2019-12-06 MED ORDER — DEXTROSE 5 % IV SOLN
Freq: Once | INTRAVENOUS | Status: AC
  Filled 2019-12-06: qty 250

## 2019-12-06 MED ORDER — PALONOSETRON HCL INJECTION 0.25 MG/5ML
0.2500 mg | Freq: Once | INTRAVENOUS | Status: AC
  Administered 2019-12-06: 0.25 mg via INTRAVENOUS

## 2019-12-06 MED ORDER — FLUOROURACIL CHEMO INJECTION 2.5 GM/50ML
400.0000 mg/m2 | Freq: Once | INTRAVENOUS | Status: AC
  Administered 2019-12-06: 750 mg via INTRAVENOUS
  Filled 2019-12-06: qty 15

## 2019-12-06 MED ORDER — LEUCOVORIN CALCIUM INJECTION 350 MG
400.0000 mg/m2 | Freq: Once | INTRAVENOUS | Status: AC
  Administered 2019-12-06: 732 mg via INTRAVENOUS
  Filled 2019-12-06: qty 36.6

## 2019-12-06 MED ORDER — SODIUM CHLORIDE 0.9 % IV SOLN
10.0000 mg | Freq: Once | INTRAVENOUS | Status: AC
  Administered 2019-12-06: 10 mg via INTRAVENOUS
  Filled 2019-12-06: qty 10

## 2019-12-06 MED ORDER — PALONOSETRON HCL INJECTION 0.25 MG/5ML
INTRAVENOUS | Status: AC
  Filled 2019-12-06: qty 5

## 2019-12-06 NOTE — Progress Notes (Signed)
  [place] OFFICE PROGRESS NOTE   Diagnosis: Colon cancer  INTERVAL HISTORY:   Joe Garcia completed another cycle of FOLFOX on 11/23/2019.  No mouth sores, nausea/vomiting, or neuropathy symptoms.  He reports improvement in diarrhea.  He has tenderness at the lateral aspect of the Port-A-Cath site.  He has noted a "wire "in this area.  Objective:  Vital signs in last 24 hours:  Blood pressure (!) 154/80, pulse (!) 51, resp. rate 17, height _0  (1.803 m), weight 157 lb 6.4 oz (71.4 kg), SpO2 100 %.     Resp: Lungs clear bilaterally Cardio: Regular rate and rhythm GI: No hepatomegaly, nontender Vascular: No leg edema Neuro: Very mild loss of vibratory sense at the fingertips bilaterally Skin: Dryness of the hands  Portacath/PICC-2-3 mm nodular area at the lateral aspect of the Port-A-Cath incision with associated tenderness.  No drainage or erythema.  No tenderness along the subcutaneous tract  Lab Results:  Lab Results  Component Value Date   WBC 6.7 12/06/2019   HGB 11.1 (L) 12/06/2019   HCT 34.0 (L) 12/06/2019   MCV 87.9 12/06/2019   PLT 165 12/06/2019   NEUTROABS 3.5 12/06/2019    CMP  Lab Results  Component Value Date   NA 143 11/23/2019   K 3.7 11/23/2019   CL 106 11/23/2019   CO2 26 11/23/2019   GLUCOSE 98 11/23/2019   BUN 12 11/23/2019   CREATININE 1.27 (H) 11/23/2019   CALCIUM 8.9 11/23/2019   PROT 6.2 (L) 11/23/2019   ALBUMIN 3.4 (L) 11/23/2019   AST 22 11/23/2019   ALT 21 11/23/2019   ALKPHOS 132 (H) 11/23/2019   BILITOT 0.5 11/23/2019   GFRNONAA 58 (L) 11/23/2019   GFRAA >60 11/23/2019    Lab Results  Component Value Date   CEA1 1.4 06/27/2019     Medications: I have reviewed the patient's current medications.   Assessment/Plan: 1. 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 Grade 3, lymphovascular and perineural invasion present, 4/18 lymph nodes  positive, positive lymph nodes mesenteric margin, tumor invades into adherent omentum, MSI high,loss of MLH1 and PMS2 expression ? CT abdomen/pelvis 06/26/2019-thickening within the distal transverse colon, severely atrophic and calcified right kidney ? CT chest 06/28/2019-no evidence of metastatic disease, stable 3 mm right lower lobe nodule ? Cycle 1 FOLFOX 08/04/2019 (oxaliplatin 65 mg/m due to renal function) ? Plan for repeat CTs after he has completed 5 cycles of chemotherapy ? Cycle 2 FOLFOX 08/18/2019 ? Cycle 3 FOLFOX 08/31/2019, Udenyca added ? Cycle 4 FOLFOX 10/12/2019 ? Cycle 5 FOLFOX 10/25/2019 ? Stable right lower lobe nodule on chest CT 09/21/2019 ? CT abdomen/pelvis 11/07/2019-postoperative changes related to colectomy and anastomosis.  No current evidence of disease.  No adenopathy. ? Cycle 6 FOLFOX 11/08/2019 ? Cycle 7 FOLFOX 11/23/2019 ? Cycle 8 FOLFOX 12/06/2019 2. Asthma 3. 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 Microcytic anemia, likely iron deficiency anemia secondary to #1 7. Port-A-Cath placement on 12/15/2019, Dr. Rosendo Gros 8. COVID-19 infection 09/21/2019-treated with bamlanivimab     Disposition: Joe Garcia has completed 7 cycles of FOLFOX.  He appears well.  Imodium helped diarrhea after the last cycle of chemotherapy.  He will complete cycle 8 FOLFOX today. Joe Garcia will return for an office visit and chemotherapy in 2 weeks.  Betsy Coder, MD  12/06/2019  10:50 AM

## 2019-12-06 NOTE — Patient Instructions (Signed)
Cancer Center Discharge Instructions for Patients Receiving Chemotherapy  Today you received the following chemotherapy agents: Oxaliplatin/Leucovorin/Adrucil.  To help prevent nausea and vomiting after your treatment, we encourage you to take your nausea medication as directed.    If you develop nausea and vomiting that is not controlled by your nausea medication, call the clinic.   BELOW ARE SYMPTOMS THAT SHOULD BE REPORTED IMMEDIATELY:  *FEVER GREATER THAN 100.5 F  *CHILLS WITH OR WITHOUT FEVER  NAUSEA AND VOMITING THAT IS NOT CONTROLLED WITH YOUR NAUSEA MEDICATION  *UNUSUAL SHORTNESS OF BREATH  *UNUSUAL BRUISING OR BLEEDING  TENDERNESS IN MOUTH AND THROAT WITH OR WITHOUT PRESENCE OF ULCERS  *URINARY PROBLEMS  *BOWEL PROBLEMS  UNUSUAL RASH Items with * indicate a potential emergency and should be followed up as soon as possible.  Feel free to call the clinic should you have any questions or concerns. The clinic phone number is [phone].  Please show the CHEMO ALERT CARD at check-in to the Emergency Department and triage nurse.   

## 2019-12-08 ENCOUNTER — Other Ambulatory Visit: Payer: Self-pay

## 2019-12-08 ENCOUNTER — Telehealth: Payer: Self-pay | Admitting: Oncology

## 2019-12-08 ENCOUNTER — Inpatient Hospital Stay: Payer: Medicare Other

## 2019-12-08 VITALS — BP 154/72 | Temp 98.6°F | Resp 18

## 2019-12-08 DIAGNOSIS — Z5111 Encounter for antineoplastic chemotherapy: Secondary | ICD-10-CM | POA: Diagnosis not present

## 2019-12-08 DIAGNOSIS — C184 Malignant neoplasm of transverse colon: Secondary | ICD-10-CM

## 2019-12-08 MED ORDER — HEPARIN SOD (PORK) LOCK FLUSH 100 UNIT/ML IV SOLN
500.0000 [IU] | Freq: Once | INTRAVENOUS | Status: AC | PRN
  Administered 2019-12-08: 500 [IU]
  Filled 2019-12-08: qty 5

## 2019-12-08 MED ORDER — PEGFILGRASTIM-CBQV 6 MG/0.6ML ~~LOC~~ SOSY
6.0000 mg | PREFILLED_SYRINGE | Freq: Once | SUBCUTANEOUS | Status: AC
  Administered 2019-12-08: 6 mg via SUBCUTANEOUS

## 2019-12-08 MED ORDER — SODIUM CHLORIDE 0.9% FLUSH
10.0000 mL | INTRAVENOUS | Status: DC | PRN
  Administered 2019-12-08: 10 mL
  Filled 2019-12-08: qty 10

## 2019-12-08 NOTE — Patient Instructions (Signed)

## 2019-12-08 NOTE — Telephone Encounter (Signed)
Scheduled per los. Called and left msg. Mailed printout  °

## 2019-12-18 ENCOUNTER — Other Ambulatory Visit: Payer: Self-pay | Admitting: Oncology

## 2019-12-20 ENCOUNTER — Other Ambulatory Visit: Payer: Self-pay

## 2019-12-20 ENCOUNTER — Inpatient Hospital Stay: Payer: Medicare Other

## 2019-12-20 ENCOUNTER — Encounter: Payer: Self-pay | Admitting: Nurse Practitioner

## 2019-12-20 ENCOUNTER — Inpatient Hospital Stay (HOSPITAL_BASED_OUTPATIENT_CLINIC_OR_DEPARTMENT_OTHER): Payer: Medicare Other | Admitting: Nurse Practitioner

## 2019-12-20 ENCOUNTER — Inpatient Hospital Stay: Payer: Medicare Other | Admitting: Nutrition

## 2019-12-20 VITALS — BP 158/83 | HR 60 | Temp 98.1°F | Resp 17 | Ht 71.0 in | Wt 152.8 lb

## 2019-12-20 DIAGNOSIS — C184 Malignant neoplasm of transverse colon: Secondary | ICD-10-CM | POA: Diagnosis not present

## 2019-12-20 DIAGNOSIS — Z5111 Encounter for antineoplastic chemotherapy: Secondary | ICD-10-CM | POA: Diagnosis not present

## 2019-12-20 DIAGNOSIS — Z95828 Presence of other vascular implants and grafts: Secondary | ICD-10-CM

## 2019-12-20 LAB — CMP (CANCER CENTER ONLY)
ALT: 21 U/L (ref 0–44)
AST: 23 U/L (ref 15–41)
Albumin: 3.5 g/dL (ref 3.5–5.0)
Alkaline Phosphatase: 128 U/L — ABNORMAL HIGH (ref 38–126)
Anion gap: 7 (ref 5–15)
BUN: 13 mg/dL (ref 8–23)
CO2: 25 mmol/L (ref 22–32)
Calcium: 9.3 mg/dL (ref 8.9–10.3)
Chloride: 108 mmol/L (ref 98–111)
Creatinine: 1.26 mg/dL — ABNORMAL HIGH (ref 0.61–1.24)
GFR, Est AFR Am: >60 mL/min (ref >60)
GFR, Estimated: 59 mL/min — ABNORMAL LOW (ref >60)
Glucose, Bld: 98 mg/dL (ref 70–99)
Potassium: 3.9 mmol/L (ref 3.5–5.1)
Sodium: 140 mmol/L (ref 135–145)
Total Bilirubin: 0.6 mg/dL (ref 0.3–1.2)
Total Protein: 6.5 g/dL (ref 6.5–8.1)

## 2019-12-20 LAB — CBC WITH DIFFERENTIAL (CANCER CENTER ONLY)
Abs Immature Granulocytes: 0.03 10*3/uL (ref 0.00–0.07)
Basophils Absolute: 0 10*3/uL (ref 0.0–0.1)
Basophils Relative: 1 %
Eosinophils Absolute: 0.2 10*3/uL (ref 0.0–0.5)
Eosinophils Relative: 5 %
HCT: 36.7 % — ABNORMAL LOW (ref 39.0–52.0)
Hemoglobin: 11.7 g/dL — ABNORMAL LOW (ref 13.0–17.0)
Immature Granulocytes: 1 %
Lymphocytes Relative: 40 %
Lymphs Abs: 1.9 10*3/uL (ref 0.7–4.0)
MCH: 29 pg (ref 26.0–34.0)
MCHC: 31.9 g/dL (ref 30.0–36.0)
MCV: 90.8 fL (ref 80.0–100.0)
Monocytes Absolute: 0.8 10*3/uL (ref 0.1–1.0)
Monocytes Relative: 17 %
Neutro Abs: 1.7 10*3/uL (ref 1.7–7.7)
Neutrophils Relative %: 36 %
Platelet Count: 129 10*3/uL — ABNORMAL LOW (ref 150–400)
RBC: 4.04 MIL/uL — ABNORMAL LOW (ref 4.22–5.81)
RDW: 17.9 % — ABNORMAL HIGH (ref 11.5–15.5)
WBC Count: 4.6 10*3/uL (ref 4.0–10.5)
nRBC: 0 % (ref 0.0–0.2)

## 2019-12-20 MED ORDER — LEUCOVORIN CALCIUM INJECTION 350 MG
400.0000 mg/m2 | Freq: Once | INTRAVENOUS | Status: AC
  Administered 2019-12-20: 732 mg via INTRAVENOUS
  Filled 2019-12-20: qty 36.6

## 2019-12-20 MED ORDER — ALBUTEROL SULFATE HFA 108 (90 BASE) MCG/ACT IN AERS: 2.0000 | INHALATION_SPRAY | RESPIRATORY_TRACT | 0 refills | Status: DC | PRN

## 2019-12-20 MED ORDER — SODIUM CHLORIDE 0.9% FLUSH
10.0000 mL | INTRAVENOUS | Status: DC | PRN
  Administered 2019-12-20: 10 mL
  Filled 2019-12-20: qty 10

## 2019-12-20 MED ORDER — SODIUM CHLORIDE 0.9 % IV SOLN
2400.0000 mg/m2 | INTRAVENOUS | Status: DC
  Administered 2019-12-20: 4400 mg via INTRAVENOUS
  Filled 2019-12-20: qty 88

## 2019-12-20 MED ORDER — ALBUTEROL SULFATE (2.5 MG/3ML) 0.083% IN NEBU: 2.5000 mg | INHALATION_SOLUTION | Freq: Four times a day (QID) | RESPIRATORY_TRACT | 0 refills | Status: DC | PRN

## 2019-12-20 MED ORDER — DEXTROSE 5 % IV SOLN
Freq: Once | INTRAVENOUS | Status: AC
  Filled 2019-12-20: qty 250

## 2019-12-20 MED ORDER — SODIUM CHLORIDE 0.9% FLUSH
10.0000 mL | Freq: Once | INTRAVENOUS | Status: AC
  Administered 2019-12-20: 10 mL
  Filled 2019-12-20: qty 10

## 2019-12-20 MED ORDER — SODIUM CHLORIDE 0.9 % IV SOLN
10.0000 mg | Freq: Once | INTRAVENOUS | Status: AC
  Administered 2019-12-20: 10 mg via INTRAVENOUS
  Filled 2019-12-20: qty 10

## 2019-12-20 MED ORDER — OXALIPLATIN CHEMO INJECTION 100 MG/20ML
65.0000 mg/m2 | Freq: Once | INTRAVENOUS | Status: AC
  Administered 2019-12-20: 120 mg via INTRAVENOUS
  Filled 2019-12-20: qty 24

## 2019-12-20 MED ORDER — FLUOROURACIL CHEMO INJECTION 2.5 GM/50ML
400.0000 mg/m2 | Freq: Once | INTRAVENOUS | Status: AC
  Administered 2019-12-20: 750 mg via INTRAVENOUS
  Filled 2019-12-20: qty 15

## 2019-12-20 MED ORDER — PALONOSETRON HCL INJECTION 0.25 MG/5ML
INTRAVENOUS | Status: AC
  Filled 2019-12-20: qty 5

## 2019-12-20 MED ORDER — PALONOSETRON HCL INJECTION 0.25 MG/5ML
0.2500 mg | Freq: Once | INTRAVENOUS | Status: AC
  Administered 2019-12-20: 0.25 mg via INTRAVENOUS

## 2019-12-20 NOTE — Progress Notes (Addendum)
  [place] OFFICE PROGRESS NOTE   Diagnosis: Colon cancer  INTERVAL HISTORY:   Joe Garcia returns as scheduled.  He completed cycle 8 FOLFOX 12/06/2019.  He denies nausea/vomiting.  No diarrhea.  He developed a few sores over the lower lip.  The sores resolved within a few days.  He was able to eat and drink without difficulty.  Cold sensitivity lasted about 4 days.  No numbness or tingling in the hands or feet today.  Objective:  Vital signs in last 24 hours:  Blood pressure (!) 158/83, pulse 60, temperature 98.1 F (36.7 C), temperature source Temporal, resp. rate 17, height '5\' 11"'$  (1.803 m), weight 152 lb 12.8 oz (69.3 kg), SpO2 100 %.    HEENT: No thrush or ulcers.  Specifically no lip ulcers. Resp: Lungs clear bilaterally. Cardio: Regular rate and rhythm. GI: Abdomen soft and nontender.  No hepatomegaly. Vascular: No leg edema. Neuro: Vibratory sense with very minimal decrease over the fingertips per tuning fork exam. Skin: Palms with hyperpigmentation.  No erythema or skin breakdown. Port-A-Cath without erythema.   Lab Results:  Lab Results  Component Value Date   WBC 4.6 12/20/2019   HGB 11.7 (L) 12/20/2019   HCT 36.7 (L) 12/20/2019   MCV 90.8 12/20/2019   PLT 129 (L) 12/20/2019   NEUTROABS 1.7 12/20/2019    Imaging:  No results found.  Medications: I have reviewed the patient's current medications.  Assessment/Plan: 1. 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 Grade 3, lymphovascular and perineural invasion present, 4/18 lymph nodes positive, positive lymph nodes mesenteric margin, tumor invades into adherent omentum, MSI high,loss of MLH1 and PMS2 expression ? CT abdomen/pelvis 06/26/2019-thickening within the distal transverse colon, severely atrophic and calcified right kidney ? CT chest 06/28/2019-no evidence of metastatic disease, stable 3 mm right lower lobe  nodule ? Cycle 1 FOLFOX 08/04/2019 (oxaliplatin 65 mg/m due to renal function) ? Plan for repeat CTs after he has completed 5 cycles of chemotherapy ? Cycle 2 FOLFOX 08/18/2019 ? Cycle 3 FOLFOX 08/31/2019, Udenyca added ? Cycle 4 FOLFOX 10/12/2019 ? Cycle 5 FOLFOX 10/25/2019 ? Stable right lower lobe nodule on chest CT 09/21/2019 ? CT abdomen/pelvis 11/07/2019-postoperative changes related to colectomy and anastomosis. No currentevidence of disease. No adenopathy. ? Cycle 6 FOLFOX 11/08/2019 ? Cycle 7 FOLFOX 11/23/2019 ? Cycle 8 FOLFOX 12/06/2019 ? Cycle 9 FOLFOX 12/20/2019 ? Case presented GI tumor conference 12/28/2018-tumor margin negative, positive lymph node at margin; no recommendation for further surgery or radiation; peripancreatic node on baseline CT not seen on April 2021 CT 2. Asthma 3. 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 Microcytic anemia, likely iron deficiency anemia secondary to #1 7. Port-A-Cath placement on 12/15/2019, Dr. Rosendo Gros 8. COVID-19 infection 09/21/2019-treated with bamlanivimab  Disposition: Joe Garcia appears well.  He has completed 8 cycles of FOLFOX.  Plan to proceed with cycle 9 today as scheduled.  We reviewed the CBC from today.  Counts adequate to proceed with treatment.  He will return for lab, follow-up, cycle 10 FOLFOX in 2 weeks.  He will contact the office in the interim with any problems.    [name] ANP/GNP-BC   12/20/2019  10:31 AM

## 2019-12-20 NOTE — Patient Instructions (Signed)
[facility] Discharge Instructions for Patients Receiving Chemotherapy  Today you received the following chemotherapy agents: Oxaliplatin/Leucovorin/Adrucil.  To help prevent nausea and vomiting after your treatment, we encourage you to take your nausea medication as directed.    If you develop nausea and vomiting that is not controlled by your nausea medication, call the clinic.   BELOW ARE SYMPTOMS THAT SHOULD BE REPORTED IMMEDIATELY:  *FEVER GREATER THAN 100.5 F  *CHILLS WITH OR WITHOUT FEVER  NAUSEA AND VOMITING THAT IS NOT CONTROLLED WITH YOUR NAUSEA MEDICATION  *UNUSUAL SHORTNESS OF BREATH  *UNUSUAL BRUISING OR BLEEDING  TENDERNESS IN MOUTH AND THROAT WITH OR WITHOUT PRESENCE OF ULCERS  *URINARY PROBLEMS  *BOWEL PROBLEMS  UNUSUAL RASH Items with * indicate a potential emergency and should be followed up as soon as possible.  Feel free to call the clinic should you have any questions or concerns. The clinic phone number is [phone].  Please show the CHEMO ALERT CARD at check-in to the Emergency Department and triage nurse.   

## 2019-12-20 NOTE — Progress Notes (Signed)
Nutrition Follow-up:  Nutrition follow-up completed with patient during infusion for colon cancer. Patient reports that his appetite has not been good over the past week. He denies any nausea or vomiting, endorses occasional diarrhea that resolves with Imodium and mouth sores that have healed. Patient states that he is just not hungry, recalls having chicken noodle soup during treatment today and reports hamburgers at home. Patient reports that he was drinking 2-3 Ensure supplements daily, but has ran out and requesting additional Ensure Enlive.   Medications: Lasix, Imdodium, Compazine,   Labs: reviewed  Anthropometrics:  Patient weight today 152 lb 12.8 oz decreased from 157 lb 6.4 oz on 5/11, decreased from 159 lb 1.6 oz on 4/13. This indicates a 4.62 lb (2.9%) wt loss in 2 weeks which is significant.   NUTRITION DIAGNOSIS: Unintended weight loss ongoing  INTERVENTION: Educated on small frequent meals to increase daily intake of calories and protein. Patient agrees to drink 3 Ensure daily. Provided complimentary case of Ensure Enlive    MONITORING, EVALUATION, GOAL: Patient will tolerate increased calories and protein to minimize weight loss   NEXT VISIT: Tuesday, June 8 during infusion  Lajuan Lines, RD, LDN Clinical Nutrition After Hours/Weekend Pager # in [phone]

## 2019-12-22 ENCOUNTER — Other Ambulatory Visit: Payer: Self-pay

## 2019-12-22 ENCOUNTER — Inpatient Hospital Stay: Payer: Medicare Other

## 2019-12-22 VITALS — BP 148/72 | HR 72 | Temp 98.2°F | Resp 18

## 2019-12-22 DIAGNOSIS — Z5111 Encounter for antineoplastic chemotherapy: Secondary | ICD-10-CM | POA: Diagnosis not present

## 2019-12-22 DIAGNOSIS — C184 Malignant neoplasm of transverse colon: Secondary | ICD-10-CM

## 2019-12-22 MED ORDER — PEGFILGRASTIM-CBQV 6 MG/0.6ML ~~LOC~~ SOSY
6.0000 mg | PREFILLED_SYRINGE | Freq: Once | SUBCUTANEOUS | Status: AC
  Administered 2019-12-22: 6 mg via SUBCUTANEOUS

## 2019-12-22 MED ORDER — HEPARIN SOD (PORK) LOCK FLUSH 100 UNIT/ML IV SOLN
500.0000 [IU] | Freq: Once | INTRAVENOUS | Status: DC | PRN
  Filled 2019-12-22: qty 5

## 2019-12-22 MED ORDER — SODIUM CHLORIDE 0.9% FLUSH
10.0000 mL | INTRAVENOUS | Status: DC | PRN
  Filled 2019-12-22: qty 10

## 2019-12-27 ENCOUNTER — Other Ambulatory Visit: Payer: Self-pay | Admitting: Oncology

## 2019-12-27 ENCOUNTER — Other Ambulatory Visit (HOSPITAL_COMMUNITY): Payer: Self-pay

## 2019-12-27 NOTE — Progress Notes (Signed)
Paramedicine Encounter    Patient ID: Joe Garcia, male    DOB: 11-16-1950, 69 y.o.   MRN: [id number]   Patient Care Team: Medicine, [facility] as PCP - General [name], LCSW as Social Worker (Licensed Clinical Social Worker)  Patient Active Problem List   Diagnosis Date Noted   Port-A-Cath in place [id number]   Chronic systolic heart failure ([place])    Colonic obstruction ([place])    Cancer of transverse colon s/p partial colectomy 06/30/2019    Nausea & vomiting 06/26/2019   Colonic mass 06/26/2019   COPD GOLD ?  11/24/2018   Chest pain    NSTEMI (non-ST elevated myocardial infarction) ([place]) 10/03/2018   COPD with acute exacerbation ([place]) 07/26/2018   ARF (acute renal failure) ([place]) 07/26/2018   Nausea and vomiting 07/26/2018   Weight loss, unintentional 07/26/2018   Malnutrition of moderate degree 07/16/2017   CKD (chronic kidney disease), stage II 07/14/2017   Solitary kidney, congenital 06/23/2017   Nonischemic cardiomyopathy ([place])    Multifocal PVCs    Chronic combined systolic (congestive) and diastolic (congestive) heart failure ([place]) 06/11/2017   Hypokalemia 05/05/2017   DOE (dyspnea on exertion) 07/16/2013   Alcohol abuse 09/25/2012   Alcohol dependence ([place]) 09/22/2012   Essential hypertension 03/22/2007   DEGENERATIVE [place] DISEASE 03/22/2007   AVASCULAR NECROSIS 03/22/2007    Current Outpatient Medications:    acetaminophen (TYLENOL) 500 MG tablet, Take 2 tablets (1,000 mg total) by mouth 3 (three) times daily as needed for headache (pain)., Disp: 30 tablet, Rfl: 0   albuterol (PROVENTIL) (2.5 MG/3ML) 0.083% nebulizer solution, Take 3 mLs (2.5 mg total) by nebulization every 6 (six) hours as needed for wheezing or shortness of breath., Disp: 75 mL, Rfl: 0   albuterol (VENTOLIN HFA) 108 (90 Base) MCG/ACT inhaler, Inhale 2 puffs into the lungs every 4 (four) hours as needed for wheezing or shortness of breath., Disp:  18 g, Rfl: 0   amiodarone (PACERONE) 100 MG tablet, Take 1 tablet (100 mg total) by mouth daily., Disp: 30 tablet, Rfl: 6   budesonide-formoterol (SYMBICORT) 160-4.5 MCG/ACT inhaler, Inhale 2 puffs into the lungs 2 (two) times daily., Disp: 1 Inhaler, Rfl: 11   carvedilol (COREG) 3.125 MG tablet, Take 1 tablet (3.125 mg total) by mouth 2 (two) times daily with a meal., Disp: 60 tablet, Rfl: 5   fluticasone (FLONASE) 50 MCG/ACT nasal spray, Place 2 sprays into both nostrils daily., Disp: 16 g, Rfl: 0   furosemide (LASIX) 20 MG tablet, Take 1 tablet (20 mg total) by mouth daily., Disp: 30 tablet, Rfl: 5   isosorbide-hydrALAZINE (BIDIL) 20-37.5 MG tablet, Take 0.5 tablets by mouth 3 (three) times daily. , Disp: , Rfl:    lidocaine-prilocaine (EMLA) cream, Apply to portacath site 1-2 hours prior to use (Patient taking differently: Apply 1 application topically See admin instructions. Apply to portacath site 1-2 hours prior to use), Disp: 30 g, Rfl: 3   loperamide (IMODIUM) 2 MG capsule, Take 2-4 mg by mouth 4 (four) times daily as needed., Disp: , Rfl:    loratadine (CLARITIN) 10 MG tablet, Take 10 mg by mouth daily., Disp: , Rfl:    losartan (COZAAR) 25 MG tablet, TAKE 1 TABLET BY MOUTH EVERY DAY (Patient taking differently: Take 25 mg by mouth at bedtime. ), Disp: 30 tablet, Rfl: 6   montelukast (SINGULAIR) 10 MG tablet, Take 10 mg by mouth at bedtime., Disp: , Rfl:    prochlorperazine (COMPAZINE) 10 MG tablet,  Take 10 mg by mouth every 6 (six) hours as needed for nausea or vomiting., Disp: , Rfl:    spironolactone (ALDACTONE) 25 MG tablet, TAKE 1 TABLET(25 MG) BY MOUTH DAILY, Disp: 30 tablet, Rfl: 3 Allergies  Allergen Reactions   Lisinopril Swelling    Facial and tongue swelling 10/2012      Social History   Socioeconomic History   Marital status: Single    Spouse name: Not on file   Number of children: Not on file   Years of education: Not on file   Highest education  level: Not on file  Occupational History   Occupation: "it don't work for me", on disability  Tobacco Use   Smoking status: Former Smoker    Packs/day: 0.10    Years: 45.00    Pack years: 4.50    Quit date: 2012    Years since quitting: 9.4   Smokeless tobacco: Never Used  Substance and Sexual Activity   Alcohol use: No    Comment: Pt states no etoh since April 2014   Drug use: No    Comment: Prior use of crack cocaine, quit 2012   Sexual activity: Never  Other Topics Concern   Not on file  Social History Narrative   Not on file   Social Determinants of Health   Financial Resource Strain:    Difficulty of Paying Living Expenses:   Food Insecurity:    Worried About Charity fundraiser in the Last Year:    Arboriculturist in the Last Year:   Transportation Needs:    Film/video editor (Medical):    Lack of Transportation (Non-Medical):   Physical Activity:    Days of Exercise per Week:    Minutes of Exercise per Session:   Stress:    Feeling of Stress :   Social Connections:    Frequency of Communication with Friends and Family:    Frequency of Social Gatherings with Friends and Family:    Attends Religious Services:    Active Member of Clubs or Organizations:    Attends Archivist Meetings:    Marital Status:   Intimate Partner Violence:    Fear of Current or Ex-Partner:    Emotionally Abused:    Physically Abused:    Sexually Abused:     Physical Exam      Future Appointments  Date Time Provider [place]  01/03/2020  9:30 AM [facility] ONLY [facility] None  01/03/2020  9:45 AM [facility] [place] [facility] None  01/03/2020 10:15 AM Owens Shark, NP [facility] None  01/03/2020 11:00 AM [facility] INFUSION [facility] None  01/05/2020 12:00 PM [facility] [place] FLUSH [facility] None  01/17/2020  7:45 AM [facility] LAB 3 [facility] None  01/17/2020  8:00 AM [facility] INFUSION [facility] None  01/17/2020  8:30 AM  Ladell Pier, MD [facility] None  01/17/2020  9:15 AM [facility] INFUSION [facility] None  01/19/2020 12:00 PM [facility] FLUSH [facility] None    BP (!) 160/76    Pulse 76    Temp (!) 96.6 F (35.9 C)    Resp 18    SpO2 98%  Last visit weight-148  Pt reports he has been doing ok. He denies c/p, he does get dizzy at times when he is stands up too fast.  He checked his b/p and it was 118/systolic. He is also taking chemo as well, his appetite comes and goes. Here lately it has been gone, but is slowly coming  back. He has to make himself eat something.  He is drinking ensures routinely as well.  He states he always feel sob, he reports he is using his inhaler more often. He is using the neb solutions as well.   He states he has been calling the disability office to get his paperwork and they said they have sent a copy but he hasnt gotten anything yet.  -he doesn't have a bank account-it goes straight to his card and theres no local bank here in town.  -is interested in [employer] food delivery  -he does get moms meals sometimes from the apt facility   He gets the bubble packs now. He loves those and it is working out great. Right now he is taking the bidil from bottle and has a lot of it left, so when he runs out he will get pharmacy to place in bubble packs.  B/p elevated but he has been drinking these new shakes but they have a lot of sodium in it, so I advised him to limit how much he drinks at a time. He drank 2 this morning. It helps his stomach before he takes his meds.  He does have wheezing about all over lobes. More to right side than left. He is going to f/u with PCP on this.  I will also speak to clinic about his need for f/u with them while taking his chemo.  He can be d/c from the paramedicine program now. He is able to call should he need anything in the future. He does great with f/u at his doctors on his own and can manage that as well as his meds now since they are  much easier to handle with the bubble packs.    Marylouise Stacks, [place] [facility] Paramedic  12/27/19

## 2019-12-28 ENCOUNTER — Other Ambulatory Visit: Payer: Self-pay

## 2019-12-31 ENCOUNTER — Other Ambulatory Visit (HOSPITAL_COMMUNITY): Payer: Self-pay | Admitting: Adult Health

## 2020-01-02 ENCOUNTER — Telehealth (HOSPITAL_COMMUNITY): Payer: Self-pay | Admitting: Licensed Clinical Social Worker

## 2020-01-02 NOTE — Telephone Encounter (Signed)
CSW informed by [employer] that pt has been trying to get his [employer] awards statement from [facility] to complete his [employer] free eye exam application but they keep saying they are mailing it out and it never arrives.  CSW called [employer] and spoke to representative- with pt permission they were able to fax me an awards statement- Community paramedic will get the rest of the application from the pt and CSW will review and submit for approval.  Will continue to follow and assist as needed  [name], [facility] Desk#: [phone] Cell#: [phone]

## 2020-01-03 ENCOUNTER — Inpatient Hospital Stay: Payer: Medicare Other

## 2020-01-03 ENCOUNTER — Other Ambulatory Visit: Payer: Self-pay

## 2020-01-03 ENCOUNTER — Inpatient Hospital Stay: Payer: Medicare Other | Attending: Nurse Practitioner | Admitting: Nurse Practitioner

## 2020-01-03 ENCOUNTER — Encounter: Payer: Self-pay | Admitting: Nurse Practitioner

## 2020-01-03 VITALS — BP 166/85 | HR 60 | Temp 98.1°F | Resp 18 | Ht 71.0 in | Wt 157.6 lb

## 2020-01-03 DIAGNOSIS — Z8679 Personal history of other diseases of the circulatory system: Secondary | ICD-10-CM | POA: Insufficient documentation

## 2020-01-03 DIAGNOSIS — I5022 Chronic systolic (congestive) heart failure: Secondary | ICD-10-CM | POA: Insufficient documentation

## 2020-01-03 DIAGNOSIS — J45909 Unspecified asthma, uncomplicated: Secondary | ICD-10-CM | POA: Diagnosis not present

## 2020-01-03 DIAGNOSIS — R197 Diarrhea, unspecified: Secondary | ICD-10-CM | POA: Diagnosis not present

## 2020-01-03 DIAGNOSIS — R200 Anesthesia of skin: Secondary | ICD-10-CM | POA: Diagnosis not present

## 2020-01-03 DIAGNOSIS — Z5111 Encounter for antineoplastic chemotherapy: Secondary | ICD-10-CM | POA: Insufficient documentation

## 2020-01-03 DIAGNOSIS — N261 Atrophy of kidney (terminal): Secondary | ICD-10-CM | POA: Insufficient documentation

## 2020-01-03 DIAGNOSIS — C184 Malignant neoplasm of transverse colon: Secondary | ICD-10-CM | POA: Insufficient documentation

## 2020-01-03 DIAGNOSIS — D509 Iron deficiency anemia, unspecified: Secondary | ICD-10-CM | POA: Diagnosis not present

## 2020-01-03 DIAGNOSIS — Z79899 Other long term (current) drug therapy: Secondary | ICD-10-CM | POA: Diagnosis not present

## 2020-01-03 DIAGNOSIS — Z5189 Encounter for other specified aftercare: Secondary | ICD-10-CM | POA: Diagnosis not present

## 2020-01-03 DIAGNOSIS — G629 Polyneuropathy, unspecified: Secondary | ICD-10-CM | POA: Diagnosis not present

## 2020-01-03 DIAGNOSIS — Z8616 Personal history of COVID-19: Secondary | ICD-10-CM | POA: Insufficient documentation

## 2020-01-03 LAB — CMP (CANCER CENTER ONLY)
ALT: 24 U/L (ref 0–44)
AST: 23 U/L (ref 15–41)
Albumin: 3.2 g/dL — ABNORMAL LOW (ref 3.5–5.0)
Alkaline Phosphatase: 136 U/L — ABNORMAL HIGH (ref 38–126)
Anion gap: 10 (ref 5–15)
BUN: 10 mg/dL (ref 8–23)
CO2: 23 mmol/L (ref 22–32)
Calcium: 9 mg/dL (ref 8.9–10.3)
Chloride: 108 mmol/L (ref 98–111)
Creatinine: 1.28 mg/dL — ABNORMAL HIGH (ref 0.61–1.24)
GFR, Est AFR Am: >60 mL/min (ref >60)
GFR, Estimated: 58 mL/min — ABNORMAL LOW (ref >60)
Glucose, Bld: 95 mg/dL (ref 70–99)
Potassium: 3.8 mmol/L (ref 3.5–5.1)
Sodium: 141 mmol/L (ref 135–145)
Total Bilirubin: 0.5 mg/dL (ref 0.3–1.2)
Total Protein: 5.9 g/dL — ABNORMAL LOW (ref 6.5–8.1)

## 2020-01-03 LAB — CBC WITH DIFFERENTIAL (CANCER CENTER ONLY)
Abs Immature Granulocytes: 0.24 10*3/uL — ABNORMAL HIGH (ref 0.00–0.07)
Basophils Absolute: 0.1 10*3/uL (ref 0.0–0.1)
Basophils Relative: 1 %
Eosinophils Absolute: 0.2 10*3/uL (ref 0.0–0.5)
Eosinophils Relative: 2 %
HCT: 33.7 % — ABNORMAL LOW (ref 39.0–52.0)
Hemoglobin: 11 g/dL — ABNORMAL LOW (ref 13.0–17.0)
Immature Granulocytes: 3 %
Lymphocytes Relative: 26 %
Lymphs Abs: 2.2 10*3/uL (ref 0.7–4.0)
MCH: 29.3 pg (ref 26.0–34.0)
MCHC: 32.6 g/dL (ref 30.0–36.0)
MCV: 89.6 fL (ref 80.0–100.0)
Monocytes Absolute: 0.9 10*3/uL (ref 0.1–1.0)
Monocytes Relative: 11 %
Neutro Abs: 4.7 10*3/uL (ref 1.7–7.7)
Neutrophils Relative %: 57 %
Platelet Count: 117 10*3/uL — ABNORMAL LOW (ref 150–400)
RBC: 3.76 MIL/uL — ABNORMAL LOW (ref 4.22–5.81)
RDW: 17.7 % — ABNORMAL HIGH (ref 11.5–15.5)
WBC Count: 8.3 10*3/uL (ref 4.0–10.5)
nRBC: 0 % (ref 0.0–0.2)

## 2020-01-03 MED ORDER — PALONOSETRON HCL INJECTION 0.25 MG/5ML
INTRAVENOUS | Status: AC
  Filled 2020-01-03: qty 5

## 2020-01-03 MED ORDER — PALONOSETRON HCL INJECTION 0.25 MG/5ML
0.2500 mg | Freq: Once | INTRAVENOUS | Status: AC
  Administered 2020-01-03: 0.25 mg via INTRAVENOUS

## 2020-01-03 MED ORDER — LEUCOVORIN CALCIUM INJECTION 350 MG
400.0000 mg/m2 | Freq: Once | INTRAVENOUS | Status: AC
  Administered 2020-01-03: 732 mg via INTRAVENOUS
  Filled 2020-01-03: qty 36.6

## 2020-01-03 MED ORDER — OXALIPLATIN CHEMO INJECTION 100 MG/20ML
65.0000 mg/m2 | Freq: Once | INTRAVENOUS | Status: AC
  Administered 2020-01-03: 120 mg via INTRAVENOUS
  Filled 2020-01-03: qty 10

## 2020-01-03 MED ORDER — FLUOROURACIL CHEMO INJECTION 2.5 GM/50ML
400.0000 mg/m2 | Freq: Once | INTRAVENOUS | Status: AC
  Administered 2020-01-03: 750 mg via INTRAVENOUS
  Filled 2020-01-03: qty 15

## 2020-01-03 MED ORDER — DEXTROSE 5 % IV SOLN
Freq: Once | INTRAVENOUS | Status: AC
  Filled 2020-01-03: qty 250

## 2020-01-03 MED ORDER — SODIUM CHLORIDE 0.9 % IV SOLN
2400.0000 mg/m2 | INTRAVENOUS | Status: DC
  Administered 2020-01-03: 4400 mg via INTRAVENOUS
  Filled 2020-01-03: qty 88

## 2020-01-03 MED ORDER — SODIUM CHLORIDE 0.9 % IV SOLN
10.0000 mg | Freq: Once | INTRAVENOUS | Status: AC
  Administered 2020-01-03: 10 mg via INTRAVENOUS
  Filled 2020-01-03: qty 10

## 2020-01-03 NOTE — Progress Notes (Signed)
Nutrition Follow-up:   Patient with colon cancer.  Receiving chemotherapy.    Met with patient during infusion.  Patient ate 100% of chicken noodle soup with crackers during treatment before RD arrived.  Patient reports appetite is better.  Has a single lip sore but not effecting intake.  Reports 2 episodes of loose stool. Reports drinking more than 2 ensure per day will cause diarrhea.  Reports yesterday ate hot dog, fried chicken, potatoes and pork and beans.      Medications: reviewed  Labs: reviewed  Anthropometrics:   Weight increased to 157 lb today from 152 lb on 5/25.   157 lb on 5/11   NUTRITION DIAGNOSIS: Unintentional weight loss improving   INTERVENTION:  Reviewed foods high in protein and encouraged protein rich food at meal times. Continue drinking ensure BID.  Case given to patient today.      MONITORING, EVALUATION, GOAL: Patient will tolerate increased calories and protein to minimize weight loss   NEXT VISIT: June 22 during infusion  Kian Ottaviano B. Zenia Resides, [place], [place] Registered Dietitian [phone] (pager)

## 2020-01-03 NOTE — Progress Notes (Signed)
  [place] OFFICE PROGRESS NOTE   Diagnosis: Colon cancer  INTERVAL HISTORY:   Joe Garcia returns as scheduled.  He completed cycle 9 FOLFOX 12/20/2019. He denies nausea/vomiting. He had a single lip sore. 2 loose stools. Cold sensitivity lasted about 5 days. No persistent neuropathy symptoms. For the past week or so he has noted intermittent tenderness at the right nipple. No redness or swelling. No fever.  Objective:  Vital signs in last 24 hours:  Blood pressure (!) 166/85, pulse 60, temperature 98.1 F (36.7 C), temperature source Temporal, resp. rate 18, height _0  (1.803 m), weight 157 lb 9.6 oz (71.5 kg), SpO2 100 %.    HEENT: Mild white coating over tongue. No buccal thrush. No ulcers. GI: Abdomen soft and nontender. No hepatomegaly. Vascular: No leg edema. Neuro: Vibratory sense with very mild decrease over the fingertips per tuning fork exam. Skin: Palms dry appearing. Breast: Mild tenderness right nipple. Breast without erythema or edema. Port-A-Cath without erythema.   Lab Results:  Lab Results  Component Value Date   WBC 8.3 01/03/2020   HGB 11.0 (L) 01/03/2020   HCT 33.7 (L) 01/03/2020   MCV 89.6 01/03/2020   PLT 117 (L) 01/03/2020   NEUTROABS 4.7 01/03/2020    Imaging:  No results found.  Medications: I have reviewed the patient's current medications.  Assessment/Plan: 1. 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 Grade 3, lymphovascular and perineural invasion present, 4/18 lymph nodes positive, positive lymph nodes mesenteric margin, tumor invades into adherent omentum, MSI high,loss of MLH1 and PMS2 expression ? CT abdomen/pelvis 06/26/2019-thickening within the distal transverse colon, severely atrophic and calcified right kidney ? CT chest 06/28/2019-no evidence of metastatic disease, stable 3 mm right lower lobe nodule ? Cycle 1 FOLFOX 08/04/2019 (oxaliplatin 65  mg/m due to renal function) ? Plan for repeat CTs after he has completed 5 cycles of chemotherapy ? Cycle 2 FOLFOX 08/18/2019 ? Cycle 3 FOLFOX 08/31/2019, Udenyca added ? Cycle 4 FOLFOX 10/12/2019 ? Cycle 5 FOLFOX 10/25/2019 ? Stable right lower lobe nodule on chest CT 09/21/2019 ? CT abdomen/pelvis 11/07/2019-postoperative changes related to colectomy and anastomosis. No currentevidence of disease. No adenopathy. ? Cycle 6 FOLFOX 11/08/2019 ? Cycle 7 FOLFOX 11/23/2019 ? Cycle 8 FOLFOX 12/06/2019 ? Cycle 9 FOLFOX 12/20/2019 ? Case presented GI tumor conference 12/28/2018-tumor margin negative, positive lymph node at margin; no recommendation for further surgery or radiation; peripancreatic node on baseline CT not seen on April 2021 CT ? Cycle 10 FOLFOX 01/03/2020 2. Asthma 3. 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 Microcytic anemia, likely iron deficiency anemia secondary to #1 7. Port-A-Cath placement on 12/15/2019, Dr. Rosendo Gros 8. COVID-19 infection 09/21/2019-treated with bamlanivimab  Disposition: Joe Garcia appears stable. He has completed 9 cycles of FOLFOX.  Plan to proceed with cycle 10 today as scheduled.  We reviewed the CBC from today.  Counts adequate to proceed with treatment.  He has mild thrombocytopenia.  He understands to contact the office with any bleeding.  He will return for lab, follow-up, cycle 11 FOLFOX in 2 weeks.  He will contact the office in the interim as outlined above or with any other problems.  Plan reviewed with Dr. [name].    [name] ANP/GNP-BC   01/03/2020  10:12 AM

## 2020-01-03 NOTE — Patient Instructions (Signed)
[place] [facility] Discharge Instructions for Patients Receiving Chemotherapy  Today you received the following chemotherapy agents: Oxaliplatin/Leucovorin/Adrucil.  To help prevent nausea and vomiting after your treatment, we encourage you to take your nausea medication as directed.    If you develop nausea and vomiting that is not controlled by your nausea medication, call the clinic.   BELOW ARE SYMPTOMS THAT SHOULD BE REPORTED IMMEDIATELY:  *FEVER GREATER THAN 100.5 F  *CHILLS WITH OR WITHOUT FEVER  NAUSEA AND VOMITING THAT IS NOT CONTROLLED WITH YOUR NAUSEA MEDICATION  *UNUSUAL SHORTNESS OF BREATH  *UNUSUAL BRUISING OR BLEEDING  TENDERNESS IN MOUTH AND THROAT WITH OR WITHOUT PRESENCE OF ULCERS  *URINARY PROBLEMS  *BOWEL PROBLEMS  UNUSUAL RASH Items with * indicate a potential emergency and should be followed up as soon as possible.  Feel free to call the clinic should you have any questions or concerns. The clinic phone number is [phone].  Please show the [place] at check-in to the Emergency Department and triage nurse.

## 2020-01-04 ENCOUNTER — Telehealth: Payer: Self-pay | Admitting: Oncology

## 2020-01-04 ENCOUNTER — Telehealth (HOSPITAL_COMMUNITY): Payer: Self-pay | Admitting: Licensed Clinical Social Worker

## 2020-01-04 NOTE — Telephone Encounter (Signed)
Scheduled per 6/8 los. Noted to give pt updated calendar on next visit.

## 2020-01-04 NOTE — Telephone Encounter (Signed)
Pt completed [place] [employer] application for free eye exam and returned to [employer].  CSW completed social worker portion and turned in for review- will hopefully here within 2 weeks regarding pt approval or denial.  CSW will continue to follow and assist as needed  [name], [place] [facility] Desk#: [phone] Cell#: [phone]

## 2020-01-05 ENCOUNTER — Other Ambulatory Visit: Payer: Self-pay

## 2020-01-05 ENCOUNTER — Inpatient Hospital Stay: Payer: Medicare Other

## 2020-01-05 VITALS — BP 148/62 | HR 76 | Temp 98.6°F | Resp 18

## 2020-01-05 DIAGNOSIS — Z5111 Encounter for antineoplastic chemotherapy: Secondary | ICD-10-CM | POA: Diagnosis not present

## 2020-01-05 DIAGNOSIS — C184 Malignant neoplasm of transverse colon: Secondary | ICD-10-CM

## 2020-01-05 MED ORDER — HEPARIN SOD (PORK) LOCK FLUSH 100 UNIT/ML IV SOLN
500.0000 [IU] | Freq: Once | INTRAVENOUS | Status: AC | PRN
  Administered 2020-01-05: 500 [IU]
  Filled 2020-01-05: qty 5

## 2020-01-05 MED ORDER — PEGFILGRASTIM-JMDB 6 MG/0.6ML ~~LOC~~ SOSY
PREFILLED_SYRINGE | SUBCUTANEOUS | Status: AC
  Filled 2020-01-05: qty 0.6

## 2020-01-05 MED ORDER — PEGFILGRASTIM-CBQV 6 MG/0.6ML ~~LOC~~ SOSY
6.0000 mg | PREFILLED_SYRINGE | Freq: Once | SUBCUTANEOUS | Status: AC
  Administered 2020-01-05: 6 mg via SUBCUTANEOUS

## 2020-01-05 MED ORDER — PEGFILGRASTIM-CBQV 6 MG/0.6ML ~~LOC~~ SOSY
PREFILLED_SYRINGE | SUBCUTANEOUS | Status: AC
  Filled 2020-01-05: qty 0.6

## 2020-01-05 MED ORDER — SODIUM CHLORIDE 0.9% FLUSH
10.0000 mL | INTRAVENOUS | Status: DC | PRN
  Administered 2020-01-05: 10 mL
  Filled 2020-01-05: qty 10

## 2020-01-05 NOTE — Patient Instructions (Signed)

## 2020-01-14 ENCOUNTER — Other Ambulatory Visit: Payer: Self-pay | Admitting: Oncology

## 2020-01-16 MED FILL — Dexamethasone Sodium Phosphate Inj 100 MG/10ML: INTRAMUSCULAR | Qty: 1 | Status: AC

## 2020-01-17 ENCOUNTER — Inpatient Hospital Stay: Payer: Medicare Other

## 2020-01-17 ENCOUNTER — Inpatient Hospital Stay (HOSPITAL_BASED_OUTPATIENT_CLINIC_OR_DEPARTMENT_OTHER): Payer: Medicare Other | Admitting: Oncology

## 2020-01-17 ENCOUNTER — Other Ambulatory Visit: Payer: Self-pay

## 2020-01-17 VITALS — BP 145/74 | HR 58 | Temp 98.1°F | Resp 17 | Ht 71.0 in | Wt 153.5 lb

## 2020-01-17 DIAGNOSIS — C184 Malignant neoplasm of transverse colon: Secondary | ICD-10-CM | POA: Diagnosis not present

## 2020-01-17 DIAGNOSIS — Z5111 Encounter for antineoplastic chemotherapy: Secondary | ICD-10-CM | POA: Diagnosis not present

## 2020-01-17 DIAGNOSIS — Z95828 Presence of other vascular implants and grafts: Secondary | ICD-10-CM

## 2020-01-17 LAB — CMP (CANCER CENTER ONLY)
ALT: 22 U/L (ref 0–44)
AST: 27 U/L (ref 15–41)
Albumin: 3.5 g/dL (ref 3.5–5.0)
Alkaline Phosphatase: 163 U/L — ABNORMAL HIGH (ref 38–126)
Anion gap: 10 (ref 5–15)
BUN: 10 mg/dL (ref 8–23)
CO2: 22 mmol/L (ref 22–32)
Calcium: 9.5 mg/dL (ref 8.9–10.3)
Chloride: 104 mmol/L (ref 98–111)
Creatinine: 1.45 mg/dL — ABNORMAL HIGH (ref 0.61–1.24)
GFR, Est AFR Am: 58 mL/min — ABNORMAL LOW (ref >60)
GFR, Estimated: 50 mL/min — ABNORMAL LOW (ref >60)
Glucose, Bld: 98 mg/dL (ref 70–99)
Potassium: 4.2 mmol/L (ref 3.5–5.1)
Sodium: 136 mmol/L (ref 135–145)
Total Bilirubin: 0.7 mg/dL (ref 0.3–1.2)
Total Protein: 6.7 g/dL (ref 6.5–8.1)

## 2020-01-17 LAB — CBC WITH DIFFERENTIAL (CANCER CENTER ONLY)
Abs Immature Granulocytes: 0.05 10*3/uL (ref 0.00–0.07)
Basophils Absolute: 0 10*3/uL (ref 0.0–0.1)
Basophils Relative: 1 %
Eosinophils Absolute: 0.1 10*3/uL (ref 0.0–0.5)
Eosinophils Relative: 3 %
HCT: 35.3 % — ABNORMAL LOW (ref 39.0–52.0)
Hemoglobin: 11.7 g/dL — ABNORMAL LOW (ref 13.0–17.0)
Immature Granulocytes: 1 %
Lymphocytes Relative: 31 %
Lymphs Abs: 1.8 10*3/uL (ref 0.7–4.0)
MCH: 29.8 pg (ref 26.0–34.0)
MCHC: 33.1 g/dL (ref 30.0–36.0)
MCV: 89.8 fL (ref 80.0–100.0)
Monocytes Absolute: 1.2 10*3/uL — ABNORMAL HIGH (ref 0.1–1.0)
Monocytes Relative: 21 %
Neutro Abs: 2.5 10*3/uL (ref 1.7–7.7)
Neutrophils Relative %: 43 %
Platelet Count: 150 10*3/uL (ref 150–400)
RBC: 3.93 MIL/uL — ABNORMAL LOW (ref 4.22–5.81)
RDW: 17.5 % — ABNORMAL HIGH (ref 11.5–15.5)
WBC Count: 5.7 10*3/uL (ref 4.0–10.5)
nRBC: 0 % (ref 0.0–0.2)

## 2020-01-17 MED ORDER — DEXTROSE 5 % IV SOLN
Freq: Once | INTRAVENOUS | Status: AC
  Filled 2020-01-17: qty 250

## 2020-01-17 MED ORDER — SODIUM CHLORIDE 0.9 % IV SOLN
2400.0000 mg/m2 | INTRAVENOUS | Status: DC
  Administered 2020-01-17: 4400 mg via INTRAVENOUS
  Filled 2020-01-17: qty 88

## 2020-01-17 MED ORDER — SODIUM CHLORIDE 0.9% FLUSH
10.0000 mL | Freq: Once | INTRAVENOUS | Status: DC
  Filled 2020-01-17: qty 10

## 2020-01-17 MED ORDER — PALONOSETRON HCL INJECTION 0.25 MG/5ML
INTRAVENOUS | Status: AC
  Filled 2020-01-17: qty 5

## 2020-01-17 MED ORDER — FLUOROURACIL CHEMO INJECTION 2.5 GM/50ML
400.0000 mg/m2 | Freq: Once | INTRAVENOUS | Status: AC
  Administered 2020-01-17: 750 mg via INTRAVENOUS
  Filled 2020-01-17: qty 15

## 2020-01-17 MED ORDER — LEUCOVORIN CALCIUM INJECTION 350 MG
400.0000 mg/m2 | Freq: Once | INTRAVENOUS | Status: AC
  Administered 2020-01-17: 732 mg via INTRAVENOUS
  Filled 2020-01-17: qty 36.6

## 2020-01-17 NOTE — Progress Notes (Signed)
Nutrition  RD planning to see patient in infusion this am but infusion completed early. Patient had already been discharged before RD able to see him.  Will follow-up on 7/6, next infusion.  Alitzel Cookson B. Zenia Resides, [place], [place] Registered Dietitian [phone] (pager)

## 2020-01-17 NOTE — Progress Notes (Signed)
Per Dr. [name]: will hold oxaliplatin today and remove some premeds and holding Udenyca.

## 2020-01-17 NOTE — Progress Notes (Signed)
[place] OFFICE PROGRESS NOTE   Diagnosis: Colon cancer  INTERVAL HISTORY:   Joe Garcia completed another cycle of chemotherapy on 01/03/2020.  He reports altered taste that has persisted following this cycle of chemotherapy.  The taste loss was transient in the past.  He has numbness lasting for approximately 1 day following chemotherapy.  No peripheral numbness at present.  He continues to have tenderness at the right nipple.  No other complaint.  Objective:  Vital signs in last 24 hours:  Blood pressure (!) 145/74, pulse (!) 58, temperature 98.1 F (36.7 C), temperature source Temporal, resp. rate 17, height '5\' 11"'$  (1.803 m), weight 153 lb 8 oz (69.6 kg), SpO2 98 %.    HEENT: No thrush or ulcers Resp: Lungs clear bilaterally Cardio: Regular rate and rhythm GI: No hepatomegaly, nontender Vascular: No leg edema Neuro: Very mild to mild loss of vibratory sense at the fingertips bilaterally Skin: Examination of the right nipple is unremarkable  Portacath/PICC-without erythema  Lab Results:  Lab Results  Component Value Date   WBC 5.7 01/17/2020   HGB 11.7 (L) 01/17/2020   HCT 35.3 (L) 01/17/2020   MCV 89.8 01/17/2020   PLT 150 01/17/2020   NEUTROABS 2.5 01/17/2020    CMP  Lab Results  Component Value Date   NA 136 01/17/2020   K 4.2 01/17/2020   CL 104 01/17/2020   CO2 22 01/17/2020   GLUCOSE 98 01/17/2020   BUN 10 01/17/2020   CREATININE 1.45 (H) 01/17/2020   CALCIUM 9.5 01/17/2020   PROT 6.7 01/17/2020   ALBUMIN 3.5 01/17/2020   AST 27 01/17/2020   ALT 22 01/17/2020   ALKPHOS 163 (H) 01/17/2020   BILITOT 0.7 01/17/2020   GFRNONAA 50 (L) 01/17/2020   GFRAA 58 (L) 01/17/2020    Lab Results  Component Value Date   CEA1 1.4 06/27/2019    Lab Results  Component Value Date   INR 0.9 06/26/2019    Imaging:  No results found.  Medications: I have reviewed the patient's current medications.   Assessment/Plan: 1. Colon cancer,  transverse colon,stage IIIc, [id number], status post a partial transverse colectomy 06/30/2019 ? Grade 3, lymphovascular and perineural invasion present, 4/18 lymph nodes positive, positive lymph nodes mesenteric margin, tumor invades into adherent omentum, MSI high,loss of MLH1 and PMS2 expression ? CT abdomen/pelvis 06/26/2019-thickening within the distal transverse colon, severely atrophic and calcified right kidney ? CT chest 06/28/2019-no evidence of metastatic disease, stable 3 mm right lower lobe nodule ? Cycle 1 FOLFOX 08/04/2019 (oxaliplatin 65 mg/m due to renal function) ? Plan for repeat CTs after he has completed 5 cycles of chemotherapy ? Cycle 2 FOLFOX 08/18/2019 ? Cycle 3 FOLFOX 08/31/2019, Udenyca added ? Cycle 4 FOLFOX 10/12/2019 ? Cycle 5 FOLFOX 10/25/2019 ? Stable right lower lobe nodule on chest CT 09/21/2019 ? CT abdomen/pelvis 11/07/2019-postoperative changes related to colectomy and anastomosis. No currentevidence of disease. No adenopathy. ? Cycle 6 FOLFOX 11/08/2019 ? Cycle 7 FOLFOX 11/23/2019 ? Cycle 8 FOLFOX 12/06/2019 ? Cycle 9 FOLFOX 12/20/2019 ? Case presented GI tumor conference 12/28/2018-tumor margin negative, positive lymph node at margin; no recommendation for further surgery or radiation; peripancreatic node on baseline CT not seen on April 2021 CT ? Cycle 10 FOLFOX 01/03/2020  ? Cycle 11 FOLFOX 01/17/2020-oxaliplatin and Udenyca held 2. Asthma 3. 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 Microcytic anemia, likely iron deficiency anemia secondary to #1 7. Port-A-Cath placement on 12/15/2019, Dr.  Ramirez 8. COVID-19 infection 09/21/2019-treated with bamlanivimab    Disposition: Joe Garcia appears stable.  He has completed 10 cycles of adjuvant FOLFOX.  He continues to tolerate the chemotherapy well.  He has mild peripheral neuropathy symptoms and progressive loss of taste.  We decided to hold oxaliplatin with chemotherapy today.  He  will return for an office visit and the final cycle of adjuvant chemotherapy in 2 weeks.  Betsy Coder, MD  01/17/2020  9:22 AM

## 2020-01-17 NOTE — Patient Instructions (Signed)
[place] Discharge Instructions for Patients Receiving Chemotherapy  Today you received the following chemotherapy agents Leucovorin, 5FU push and pump.  To help prevent nausea and vomiting after your treatment, we encourage you to take your nausea medication.   If you develop nausea and vomiting that is not controlled by your nausea medication, call the clinic.   BELOW ARE SYMPTOMS THAT SHOULD BE REPORTED IMMEDIATELY:  *FEVER GREATER THAN 100.5 F  *CHILLS WITH OR WITHOUT FEVER  NAUSEA AND VOMITING THAT IS NOT CONTROLLED WITH YOUR NAUSEA MEDICATION  *UNUSUAL SHORTNESS OF BREATH  *UNUSUAL BRUISING OR BLEEDING  TENDERNESS IN MOUTH AND THROAT WITH OR WITHOUT PRESENCE OF ULCERS  *URINARY PROBLEMS  *BOWEL PROBLEMS  UNUSUAL RASH Items with * indicate a potential emergency and should be followed up as soon as possible.  Feel free to call the clinic should you have any questions or concerns. The clinic phone number is [phone].  Please show the [place] at check-in to the Emergency Department and triage nurse.

## 2020-01-18 ENCOUNTER — Telehealth: Payer: Self-pay | Admitting: Oncology

## 2020-01-18 NOTE — Telephone Encounter (Signed)
No changes made to pt's schedule per 6/22 los.

## 2020-01-19 ENCOUNTER — Other Ambulatory Visit: Payer: Self-pay

## 2020-01-19 ENCOUNTER — Inpatient Hospital Stay: Payer: Medicare Other

## 2020-01-19 DIAGNOSIS — Z95828 Presence of other vascular implants and grafts: Secondary | ICD-10-CM

## 2020-01-19 DIAGNOSIS — Z5111 Encounter for antineoplastic chemotherapy: Secondary | ICD-10-CM | POA: Diagnosis not present

## 2020-01-19 DIAGNOSIS — C184 Malignant neoplasm of transverse colon: Secondary | ICD-10-CM

## 2020-01-19 MED ORDER — SODIUM CHLORIDE 0.9% FLUSH
10.0000 mL | Freq: Once | INTRAVENOUS | Status: AC
  Administered 2020-01-19: 10 mL
  Filled 2020-01-19: qty 10

## 2020-01-19 MED ORDER — HEPARIN SOD (PORK) LOCK FLUSH 100 UNIT/ML IV SOLN
500.0000 [IU] | Freq: Once | INTRAVENOUS | Status: AC
  Administered 2020-01-19: 500 [IU]
  Filled 2020-01-19: qty 5

## 2020-01-19 MED ORDER — PROCHLORPERAZINE MALEATE 10 MG PO TABS
ORAL_TABLET | ORAL | Status: AC
  Filled 2020-01-19: qty 1

## 2020-01-30 ENCOUNTER — Other Ambulatory Visit: Payer: Self-pay | Admitting: Oncology

## 2020-01-31 ENCOUNTER — Other Ambulatory Visit: Payer: Self-pay

## 2020-01-31 ENCOUNTER — Inpatient Hospital Stay (HOSPITAL_BASED_OUTPATIENT_CLINIC_OR_DEPARTMENT_OTHER): Payer: Medicare Other | Admitting: Oncology

## 2020-01-31 ENCOUNTER — Other Ambulatory Visit: Payer: Medicare Other

## 2020-01-31 ENCOUNTER — Inpatient Hospital Stay: Payer: Medicare Other | Attending: Nurse Practitioner

## 2020-01-31 ENCOUNTER — Encounter: Payer: Self-pay | Admitting: Dietician

## 2020-01-31 ENCOUNTER — Inpatient Hospital Stay: Payer: Medicare Other

## 2020-01-31 VITALS — BP 153/79 | HR 62 | Temp 98.1°F | Resp 18 | Ht 71.0 in | Wt 149.6 lb

## 2020-01-31 DIAGNOSIS — N261 Atrophy of kidney (terminal): Secondary | ICD-10-CM | POA: Insufficient documentation

## 2020-01-31 DIAGNOSIS — D649 Anemia, unspecified: Secondary | ICD-10-CM | POA: Insufficient documentation

## 2020-01-31 DIAGNOSIS — J45909 Unspecified asthma, uncomplicated: Secondary | ICD-10-CM | POA: Diagnosis not present

## 2020-01-31 DIAGNOSIS — Z5111 Encounter for antineoplastic chemotherapy: Secondary | ICD-10-CM | POA: Diagnosis not present

## 2020-01-31 DIAGNOSIS — T451X5A Adverse effect of antineoplastic and immunosuppressive drugs, initial encounter: Secondary | ICD-10-CM | POA: Insufficient documentation

## 2020-01-31 DIAGNOSIS — I5022 Chronic systolic (congestive) heart failure: Secondary | ICD-10-CM | POA: Insufficient documentation

## 2020-01-31 DIAGNOSIS — Z8616 Personal history of COVID-19: Secondary | ICD-10-CM | POA: Diagnosis not present

## 2020-01-31 DIAGNOSIS — G629 Polyneuropathy, unspecified: Secondary | ICD-10-CM | POA: Insufficient documentation

## 2020-01-31 DIAGNOSIS — C184 Malignant neoplasm of transverse colon: Secondary | ICD-10-CM

## 2020-01-31 DIAGNOSIS — Z9221 Personal history of antineoplastic chemotherapy: Secondary | ICD-10-CM | POA: Diagnosis not present

## 2020-01-31 DIAGNOSIS — Z79899 Other long term (current) drug therapy: Secondary | ICD-10-CM | POA: Diagnosis not present

## 2020-01-31 DIAGNOSIS — Z95828 Presence of other vascular implants and grafts: Secondary | ICD-10-CM

## 2020-01-31 LAB — CBC WITH DIFFERENTIAL (CANCER CENTER ONLY)
Abs Immature Granulocytes: 0.01 10*3/uL (ref 0.00–0.07)
Basophils Absolute: 0 10*3/uL (ref 0.0–0.1)
Basophils Relative: 1 %
Eosinophils Absolute: 0.4 10*3/uL (ref 0.0–0.5)
Eosinophils Relative: 7 %
HCT: 35.8 % — ABNORMAL LOW (ref 39.0–52.0)
Hemoglobin: 11.7 g/dL — ABNORMAL LOW (ref 13.0–17.0)
Immature Granulocytes: 0 %
Lymphocytes Relative: 33 %
Lymphs Abs: 1.8 10*3/uL (ref 0.7–4.0)
MCH: 28.7 pg (ref 26.0–34.0)
MCHC: 32.7 g/dL (ref 30.0–36.0)
MCV: 88 fL (ref 80.0–100.0)
Monocytes Absolute: 1.1 10*3/uL — ABNORMAL HIGH (ref 0.1–1.0)
Monocytes Relative: 21 %
Neutro Abs: 2.1 10*3/uL (ref 1.7–7.7)
Neutrophils Relative %: 38 %
Platelet Count: 202 10*3/uL (ref 150–400)
RBC: 4.07 MIL/uL — ABNORMAL LOW (ref 4.22–5.81)
RDW: 17.2 % — ABNORMAL HIGH (ref 11.5–15.5)
WBC Count: 5.5 10*3/uL (ref 4.0–10.5)
nRBC: 0 % (ref 0.0–0.2)

## 2020-01-31 LAB — CMP (CANCER CENTER ONLY)
ALT: 18 U/L (ref 0–44)
AST: 25 U/L (ref 15–41)
Albumin: 3.4 g/dL — ABNORMAL LOW (ref 3.5–5.0)
Alkaline Phosphatase: 134 U/L — ABNORMAL HIGH (ref 38–126)
Anion gap: 11 (ref 5–15)
BUN: 14 mg/dL (ref 8–23)
CO2: 22 mmol/L (ref 22–32)
Calcium: 9.7 mg/dL (ref 8.9–10.3)
Chloride: 105 mmol/L (ref 98–111)
Creatinine: 1.32 mg/dL — ABNORMAL HIGH (ref 0.61–1.24)
GFR, Est AFR Am: >60 mL/min (ref >60)
GFR, Estimated: 56 mL/min — ABNORMAL LOW (ref >60)
Glucose, Bld: 104 mg/dL — ABNORMAL HIGH (ref 70–99)
Potassium: 3.9 mmol/L (ref 3.5–5.1)
Sodium: 138 mmol/L (ref 135–145)
Total Bilirubin: 0.5 mg/dL (ref 0.3–1.2)
Total Protein: 7 g/dL (ref 6.5–8.1)

## 2020-01-31 LAB — CEA (IN HOUSE-CHCC): CEA (CHCC-In House): 3 ng/mL (ref 0.00–5.00)

## 2020-01-31 MED ORDER — SODIUM CHLORIDE 0.9% FLUSH
10.0000 mL | Freq: Once | INTRAVENOUS | Status: AC
  Administered 2020-01-31: 10 mL
  Filled 2020-01-31: qty 10

## 2020-01-31 MED ORDER — SODIUM CHLORIDE 0.9 % IV SOLN: 400.0000 mg/m2 | Freq: Once | INTRAVENOUS | Status: DC

## 2020-01-31 MED ORDER — SODIUM CHLORIDE 0.9 % IV SOLN
300.0000 mg/m2 | Freq: Once | INTRAVENOUS | Status: AC
  Administered 2020-01-31: 550 mg via INTRAVENOUS
  Filled 2020-01-31: qty 27.5

## 2020-01-31 MED ORDER — DEXTROSE 5 % IV SOLN
Freq: Once | INTRAVENOUS | Status: AC
  Filled 2020-01-31: qty 250

## 2020-01-31 MED ORDER — PALONOSETRON HCL INJECTION 0.25 MG/5ML
INTRAVENOUS | Status: AC
  Filled 2020-01-31: qty 5

## 2020-01-31 MED ORDER — SODIUM CHLORIDE 0.9 % IV SOLN
1800.0000 mg/m2 | INTRAVENOUS | Status: DC
  Administered 2020-01-31: 3300 mg via INTRAVENOUS
  Filled 2020-01-31: qty 66

## 2020-01-31 MED ORDER — FLUOROURACIL CHEMO INJECTION 2.5 GM/50ML
300.0000 mg/m2 | Freq: Once | INTRAVENOUS | Status: AC
  Administered 2020-01-31: 550 mg via INTRAVENOUS
  Filled 2020-01-31: qty 11

## 2020-01-31 NOTE — Progress Notes (Signed)
Nutrition  RD presented to infusion to see patient this afternoon as scheduled. RN reports infusion completed early, Oxaliplatin remains on hold per MD. Patient discharge before RD able to see him. Final cycle of adjuvant chemotherapy completed today. Will schedule nutrition follow-up via phone.    Lajuan Lines, RD, LDN Clinical Nutrition After Hours/Weekend Pager # in [place]

## 2020-01-31 NOTE — Progress Notes (Signed)
[place] OFFICE PROGRESS NOTE   Diagnosis: Colon cancer  INTERVAL HISTORY:   Mr. Faivre returns as scheduled.  He completed a cycle of 5-fluorouracil beginning 01/17/2020.  He reports developing sores and cracking of the lower lip beginning 3 to 4 days following chemotherapy.  This has resolved.  No sores elsewhere in the mouth.  No hand or foot pain.  He continues to have dryness of the hands and feet.  He has numbness in the left fingers and right thumb.  He reports altered taste.  No nausea.  He has intermittent diarrhea.  He relates this to his diet. Objective:  Vital signs in last 24 hours:  Blood pressure (!) 153/79, pulse 62, temperature 98.1 F (36.7 C), temperature source Temporal, resp. rate 18, height 5' 11" (1.803 m), weight 149 lb 9.6 oz (67.9 kg), SpO2 100 %.    HEENT: No thrush or ulcers Resp: Prolonged expiratory phase, no respiratory distress Cardio: Regular rate and rhythm GI: Nontender, no hepatosplenomegaly Vascular: No leg edema  Skin: Dryness and hyperpigmentation of the hands and feet  Portacath/PICC-without erythema  Lab Results:  Lab Results  Component Value Date   WBC 5.5 01/31/2020   HGB 11.7 (L) 01/31/2020   HCT 35.8 (L) 01/31/2020   MCV 88.0 01/31/2020   PLT 202 01/31/2020   NEUTROABS 2.1 01/31/2020    CMP  Lab Results  Component Value Date   NA 138 01/31/2020   K 3.9 01/31/2020   CL 105 01/31/2020   CO2 22 01/31/2020   GLUCOSE 104 (H) 01/31/2020   BUN 14 01/31/2020   CREATININE 1.32 (H) 01/31/2020   CALCIUM 9.7 01/31/2020   PROT 7.0 01/31/2020   ALBUMIN 3.4 (L) 01/31/2020   AST 25 01/31/2020   ALT 18 01/31/2020   ALKPHOS 134 (H) 01/31/2020   BILITOT 0.5 01/31/2020   GFRNONAA 56 (L) 01/31/2020   GFRAA >60 01/31/2020    Lab Results  Component Value Date   CEA1 1.4 06/27/2019    Medications: I have reviewed the patient's current medications.   Assessment/Plan: 1. 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 Grade 3, lymphovascular and perineural invasion present, 4/18 lymph nodes positive, positive lymph nodes mesenteric margin, tumor invades into adherent omentum, MSI high,loss of MLH1 and PMS2 expression ? CT abdomen/pelvis 06/26/2019-thickening within the distal transverse colon, severely atrophic and calcified right kidney ? CT chest 06/28/2019-no evidence of metastatic disease, stable 3 mm right lower lobe nodule ? Cycle 1 FOLFOX 08/04/2019 (oxaliplatin 65 mg/m due to renal function) ? Plan for repeat CTs after he has completed 5 cycles of chemotherapy ? Cycle 2 FOLFOX 08/18/2019 ? Cycle 3 FOLFOX 08/31/2019, Udenyca added ? Cycle 4 FOLFOX 10/12/2019 ? Cycle 5 FOLFOX 10/25/2019 ? Stable right lower lobe nodule on chest CT 09/21/2019 ? CT abdomen/pelvis 11/07/2019-postoperative changes related to colectomy and anastomosis. No currentevidence of disease. No adenopathy. ? Cycle 6 FOLFOX 11/08/2019 ? Cycle 7 FOLFOX 11/23/2019 ? Cycle 8 FOLFOX 12/06/2019 ? Cycle 9 FOLFOX 12/20/2019 ? Case presented GI tumor conference 12/28/2018-tumor margin negative, positive lymph node at margin; no recommendation for further surgery or radiation; peripancreatic node on baseline CT not seen on April 2021 CT ? Cycle 10 FOLFOX 01/03/2020  ? Cycle 11 FOLFOX 01/17/2020-oxaliplatin and Udenyca held ? Cycle 12 FOLFOX 01/31/2020-oxaliplatin and Udenyca held, 5-FU dose reduced secondary to mucositis 2. Asthma 3. 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  Microcytic anemia, likely iron deficiency anemia secondary to #1 7. Port-A-Cath placement on 12/15/2019, Dr. Rosendo Gros 8. COVID-19 infection 09/21/2019-treated with bamlanivimab 9. Oxaliplatin neuropathy      Disposition: Mr. Hippe has completed 11 cycles of adjuvant therapy.  He will complete the final cycle of adjuvant chemotherapy today.  Oxaliplatin will  remain on hold.  I will dose reduce the 5-fluorouracil secondary to mucositis at the lower lip following the recent cycle of chemotherapy.  Hopefully the altered taste and peripheral numbness will improve over the next few months.  Mr. Tisdel will return for an office visit and Port-A-Cath flush in approximately 6 weeks.  We will schedule surveillance CTs for late November and consider removing the Port-A-Cath if the CTs are negative.  Betsy Coder, MD  01/31/2020  10:29 AM

## 2020-02-02 ENCOUNTER — Inpatient Hospital Stay: Payer: Medicare Other

## 2020-02-02 ENCOUNTER — Other Ambulatory Visit: Payer: Self-pay

## 2020-02-02 ENCOUNTER — Telehealth: Payer: Self-pay | Admitting: Oncology

## 2020-02-02 VITALS — BP 127/63 | HR 66 | Temp 98.6°F | Resp 18

## 2020-02-02 DIAGNOSIS — C184 Malignant neoplasm of transverse colon: Secondary | ICD-10-CM

## 2020-02-02 DIAGNOSIS — Z5111 Encounter for antineoplastic chemotherapy: Secondary | ICD-10-CM | POA: Diagnosis not present

## 2020-02-02 MED ORDER — HEPARIN SOD (PORK) LOCK FLUSH 100 UNIT/ML IV SOLN
500.0000 [IU] | Freq: Once | INTRAVENOUS | Status: AC | PRN
  Administered 2020-02-02: 500 [IU]
  Filled 2020-02-02: qty 5

## 2020-02-02 MED ORDER — SODIUM CHLORIDE 0.9% FLUSH
10.0000 mL | INTRAVENOUS | Status: DC | PRN
  Administered 2020-02-02: 10 mL
  Filled 2020-02-02: qty 10

## 2020-02-02 NOTE — Telephone Encounter (Signed)
Scheduled appts per 7/6 los. Pt did not answer and did not have a voicemail set up. Mailed appt reminder and calendar.

## 2020-02-04 ENCOUNTER — Other Ambulatory Visit: Payer: Self-pay

## 2020-02-04 DIAGNOSIS — Z5321 Procedure and treatment not carried out due to patient leaving prior to being seen by health care provider: Secondary | ICD-10-CM | POA: Diagnosis not present

## 2020-02-04 DIAGNOSIS — R253 Fasciculation: Secondary | ICD-10-CM | POA: Diagnosis present

## 2020-02-05 ENCOUNTER — Telehealth (HOSPITAL_COMMUNITY): Payer: Self-pay

## 2020-02-05 ENCOUNTER — Other Ambulatory Visit: Payer: Self-pay

## 2020-02-05 ENCOUNTER — Encounter (HOSPITAL_COMMUNITY): Payer: Self-pay | Admitting: Emergency Medicine

## 2020-02-05 ENCOUNTER — Emergency Department (HOSPITAL_COMMUNITY)
Admission: EM | Admit: 2020-02-05 | Discharge: 2020-02-05 | Disposition: A | Discharge status: Home / Self Care | Payer: Medicare Other | Attending: Emergency Medicine | Admitting: Emergency Medicine

## 2020-02-05 ENCOUNTER — Ambulatory Visit (INDEPENDENT_AMBULATORY_CARE_PROVIDER_SITE_OTHER)
Admission: EM | Admit: 2020-02-05 | Discharge: 2020-02-05 | Disposition: A | Discharge status: Home / Self Care | Payer: Medicare Other | Source: Home / Self Care

## 2020-02-05 ENCOUNTER — Telehealth (HOSPITAL_COMMUNITY): Payer: Self-pay | Admitting: Emergency Medicine

## 2020-02-05 ENCOUNTER — Encounter (HOSPITAL_COMMUNITY): Payer: Self-pay

## 2020-02-05 DIAGNOSIS — M62838 Other muscle spasm: Secondary | ICD-10-CM

## 2020-02-05 DIAGNOSIS — R253 Fasciculation: Secondary | ICD-10-CM | POA: Diagnosis not present

## 2020-02-05 LAB — BASIC METABOLIC PANEL
Anion gap: 11 (ref 5–15)
BUN: 14 mg/dL (ref 8–23)
CO2: 20 mmol/L — ABNORMAL LOW (ref 22–32)
Calcium: 9.3 mg/dL (ref 8.9–10.3)
Chloride: 99 mmol/L (ref 98–111)
Creatinine, Ser: 1.19 mg/dL (ref 0.61–1.24)
GFR calc Af Amer: >60 mL/min (ref >60)
GFR calc non Af Amer: >60 mL/min (ref >60)
Glucose, Bld: 101 mg/dL — ABNORMAL HIGH (ref 70–99)
Potassium: 4.1 mmol/L (ref 3.5–5.1)
Sodium: 130 mmol/L — ABNORMAL LOW (ref 135–145)

## 2020-02-05 LAB — MAGNESIUM: Magnesium: 1.8 mg/dL (ref 1.7–2.4)

## 2020-02-05 MED ORDER — BACLOFEN 5 MG PO TABS: 5.0000 mg | ORAL_TABLET | Freq: Three times a day (TID) | ORAL | 0 refills | Status: DC

## 2020-02-05 MED ORDER — BACLOFEN 10 MG PO TABS
10.0000 mg | ORAL_TABLET | Freq: Three times a day (TID) | ORAL | 0 refills | Status: DC
Start: 2020-02-05 — End: 2020-05-25

## 2020-02-05 NOTE — ED Provider Notes (Signed)
[facility]    CSN: [id number] Arrival date & time: 02/05/20  1336      History   Chief Complaint Chief Complaint  Patient presents with  . Spasms    HPI Joe Garcia is a 69 y.o. male history of CKD, hypertension, metastatic colon cancer, COPD, presenting today for evaluation of muscle spasming.  Patient reports that beginning yesterday he began to develop muscle spasms in his upper extremities which is spread to his chest and lower extremities.  He cannot stop shaking due to this.  He denies any alcohol use, has been sober for 10 years.  Denies any recreational drugs.  Denies any new medicines.  He is currently on chemo.  Denies prior issues similar.  Denies any headache or vision changes associated with this.  Denies chest pain.  Denies associated pain with muscle spasming.  Denies spasming or twitching of face mouth or tongue.  He does report some numbness of fingers and in mouth.  On past oncology note it was noted that this symptom was present previously and it was attributed to likely side effect from chemo.  HPI  Past Medical History:  Diagnosis Date  . Alcoholism ([place])   . Arthritis    hips, L shoulder  . Asthma   . Cancer [facility])    colon cancer  . CHF (congestive heart failure) ([place])   . Chronic kidney disease    only has one kidney  . Depression    pt denies  . Dyspnea   . Dysrhythmia    PVC's  . Family history of anesthesia complication   . GERD (gastroesophageal reflux disease)   . Hyperlipidemia   . Hypertension   . Pneumonia     Patient Active Problem List   Diagnosis Date Noted  . Port-A-Cath in place 08/04/2019  . Chronic systolic heart failure ([place])   . Colonic obstruction ([place])   . 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 Nausea & vomiting 06/26/2019  . Colonic mass 06/26/2019  . COPD GOLD ?  11/24/2018  . Chest pain   . NSTEMI (non-ST elevated myocardial infarction) ([place]) 10/03/2018  . COPD with acute  exacerbation ([place]) 07/26/2018  . ARF (acute renal failure) ([place]) 07/26/2018  . Nausea and vomiting 07/26/2018  . Weight loss, unintentional 07/26/2018  . Malnutrition of moderate degree 07/16/2017  . 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 Solitary kidney, congenital 06/23/2017  . Nonischemic cardiomyopathy ([place])   . Multifocal PVCs   . Chronic combined systolic (congestive) and diastolic (congestive) heart failure ([place]) 06/11/2017  . Hypokalemia 05/05/2017  . DOE (dyspnea on exertion) 07/16/2013  . Alcohol abuse 09/25/2012  . Alcohol dependence ([place]) 09/22/2012  . Essential hypertension 03/22/2007  . DEGENERATIVE DISC DISEASE 03/22/2007  . 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 BIOPSY  06/27/2019   Procedure: BIOPSY;  Surgeon: Yetta Flock, MD;  Location: [facility] ENDOSCOPY;  Service: Gastroenterology;;  . COLONOSCOPY Left 06/27/2019   Procedure: COLONOSCOPY;  Surgeon: Yetta Flock, MD;  Location: [facility];  Service: Gastroenterology;  Laterality: Left;  . LAPAROTOMY N/A 06/30/2019   Procedure: EXPLORATORY LAPAROTOMY;  Surgeon: Ralene Ok, MD;  Location: [place];  Service: General;  Laterality: N/A;  . LEFT HEART CATH AND CORONARY ANGIOGRAPHY N/A 10/04/2018   Procedure: LEFT HEART CATH AND CORONARY ANGIOGRAPHY;  Surgeon: Jettie Booze, MD;  Location: [facility];  Service: Cardiovascular;  Laterality: N/A;  . PARTIAL COLECTOMY N/A 06/30/2019   Procedure: PARTIAL COLECTOMY;  Surgeon: Ralene Ok, MD;  Location: [place];  Service: General;  Laterality: N/A;  . PORTACATH PLACEMENT Left 08/02/2019   Procedure: INSERTION PORT-A-CATH LEFT CHEST;  Surgeon: Ralene Ok, MD;  Location: [place];  Service: General;  Laterality: Left;  . RIGHT/LEFT HEART CATH AND CORONARY ANGIOGRAPHY N/A 06/12/2017   Procedure: RIGHT/LEFT HEART CATH AND CORONARY ANGIOGRAPHY;  Surgeon: Jolaine Artist, MD;  Location:  [facility];  Service: Cardiovascular;  Laterality: N/A;       Home Medications    Prior to Admission medications   Medication Sig Start Date End Date Taking? Authorizing Provider  acetaminophen (TYLENOL) 500 MG tablet Take 2 tablets (1,000 mg total) by mouth 3 (three) times daily as needed for headache (pain). 07/28/18   Dhungel, Flonnie Overman, MD  albuterol (PROVENTIL) (2.5 MG/3ML) 0.083% nebulizer solution Take 3 mLs (2.5 mg total) by nebulization every 6 (six) hours as needed for wheezing or shortness of breath. 12/20/19   Owens Shark, NP  albuterol (VENTOLIN HFA) 108 (90 Base) MCG/ACT inhaler Inhale 2 puffs into the lungs every 4 (four) hours as needed for wheezing or shortness of breath. 12/20/19   Owens Shark, NP  amiodarone (PACERONE) 100 MG tablet Take 1 tablet (100 mg total) by mouth daily. 06/27/19   Larey Dresser, MD  baclofen (LIORESAL) 10 MG tablet Take 1 tablet (10 mg total) by mouth 3 (three) times daily. 02/05/20   Latoyia Tecson C, PA-C  Baclofen 5 MG TABS Take 5-10 mg by mouth 3 (three) times daily. 02/05/20   Alizon Schmeling C, PA-C  budesonide-formoterol (SYMBICORT) 160-4.5 MCG/ACT inhaler Inhale 2 puffs into the lungs 2 (two) times daily. 11/23/18   Tanda Rockers, MD  carvedilol (COREG) 3.125 MG tablet Take 1 tablet (3.125 mg total) by mouth 2 (two) times daily with a meal. 04/15/19   Bensimhon, Shaune Pascal, MD  fluticasone (FLONASE) 50 MCG/ACT nasal spray Place 2 sprays into both nostrils daily. 02/23/18   Melynda Ripple, MD  furosemide (LASIX) 20 MG tablet Take 1 tablet (20 mg total) by mouth daily. Needs appt for further refills 01/02/20   Clegg, Amy D, NP  isosorbide-hydrALAZINE (BIDIL) 20-37.5 MG tablet Take 0.5 tablets by mouth 3 (three) times daily.     [provider]  lidocaine-prilocaine (EMLA) cream Apply to portacath site 1-2 hours prior to use Patient not taking: Reported on 01/17/2020 08/04/19   Owens Shark, NP  loperamide (IMODIUM) 2 MG capsule  Take 2-4 mg by mouth 4 (four) times daily as needed. 11/14/19   [provider]  loratadine (CLARITIN) 10 MG tablet Take 10 mg by mouth daily. Patient not taking: Reported on 01/31/2020    [provider]  losartan (COZAAR) 25 MG tablet TAKE 1 TABLET BY MOUTH EVERY DAY Patient taking differently: Take 25 mg by mouth at bedtime.  05/30/19   Bensimhon, Shaune Pascal, MD  montelukast (SINGULAIR) 10 MG tablet Take 10 mg by mouth at bedtime.    [provider]  prochlorperazine (COMPAZINE) 10 MG tablet Take 10 mg by mouth every 6 (six) hours as needed for nausea or vomiting.    [provider]  spironolactone (ALDACTONE) 25 MG tablet TAKE 1 TABLET(25 MG) BY MOUTH DAILY 10/21/19   Clegg, Amy D, NP    Family History Family History  Problem Relation Age of Onset  . Heart disease Father   . Heart disease  Daughter        "fluid around heart"   . [place] disease Daughter   . Cancer Daughter        unsure of type  . Asthma Son   . Heart failure Mother   . Kidney failure Mother     Social History Social History   Tobacco Use  . Smoking status: Former Smoker    Packs/day: 0.10    Years: 45.00    Pack years: 4.50    Quit date: 2012    Years since quitting: 9.5  . Smokeless tobacco: Never Used  Vaping Use  . Vaping Use: Never used  Substance Use Topics  . Alcohol use: No    Comment: Pt states no etoh since April 2014  . Drug use: No    Comment: Prior use of crack cocaine, quit 2012     Allergies   Lisinopril   Review of Systems Review of Systems  Constitutional: Negative for fatigue and fever.  Eyes: Negative for redness, itching and visual disturbance.  Respiratory: Negative for shortness of breath.   Cardiovascular: Negative for chest pain and leg swelling.  Gastrointestinal: Negative for nausea and vomiting.  Musculoskeletal: Negative for arthralgias and myalgias.  Skin: Negative for color change, rash and wound.  Neurological: Negative for  dizziness, syncope, weakness, light-headedness and headaches.     Physical Exam Triage Vital Signs ED Triage Vitals  Enc Vitals Group     BP 02/05/20 1402 (!) 149/126     Pulse Rate 02/05/20 1402 81     Resp 02/05/20 1402 16     Temp 02/05/20 1402 98.9 F (37.2 C)     Temp Source 02/05/20 1402 Oral     SpO2 02/05/20 1402 100 %     Weight --      Height --      Head Circumference --      Peak Flow --      Pain Score 02/05/20 1401 0     Pain Loc --      Pain Edu? --      Excl. in [place]? --    No data found.  Updated Vital Signs BP (!) 144/79   Pulse 81   Temp 98.9 F (37.2 C) (Oral)   Resp 16   SpO2 100%   Visual Acuity Right Eye Distance:   Left Eye Distance:   Bilateral Distance:    Right Eye Near:   Left Eye Near:    Bilateral Near:     Physical Exam Vitals and nursing note reviewed.  Constitutional:      Appearance: He is well-developed.     Comments: No acute distress  HENT:     Head: Normocephalic and atraumatic.     Nose: Nose normal.     Mouth/Throat:     Comments: Oral mucosa pink and moist, no tonsillar enlargement or exudate. Posterior pharynx patent and nonerythematous, no uvula deviation or swelling. Normal phonation.  Eyes:     Extraocular Movements: Extraocular movements intact.     Conjunctiva/sclera: Conjunctivae normal.     Pupils: Pupils are equal, round, and reactive to light.  Cardiovascular:     Rate and Rhythm: Normal rate.  Pulmonary:     Effort: Pulmonary effort is normal. No respiratory distress.     Comments: Breathing comfortably at rest, CTABL, no wheezing, rales or other adventitious sounds auscultated Abdominal:     General: There is no distension.  Musculoskeletal:  General: Normal range of motion.     Cervical back: Neck supple.     Comments: Patient with a twitching of upper extremities and lower extremities.  Has difficulty sitting still.  Palpable spasming noted within the biceps and forearms Brachial reflex  2+ bilaterally, patellar reflex 2+ bilaterally Grip strength 5/5 and equal bilaterally  Skin:    General: Skin is warm and dry.  Neurological:     Mental Status: He is alert and oriented to person, place, and time.      UC Treatments / Results  Labs (all labs ordered are listed, but only abnormal results are displayed) Labs Reviewed  BASIC METABOLIC PANEL  MAGNESIUM    EKG   Radiology No results found.  Procedures Procedures (including critical care time)  Medications Ordered in UC Medications - No data to display  Initial Impression / Assessment and Plan / UC Course  I have reviewed the triage vital signs and the nursing notes.  Pertinent labs & imaging results that were available during my care of the patient were reviewed by me and considered in my medical decision making (see chart for details).     We will check electrolytes/magnesium to check for any abnormalities contributing to muscle spasticity.  Possible side effect of chemo.  Using Compazine for nausea, no medications identified to cause EPS.  Recommending to follow-up with oncology for further evaluation of possible cause of muscle spasticity.  Discussed strict return precautions. Patient verbalized understanding and is agreeable with plan.  Final Clinical Impressions(s) / UC Diagnoses   Final diagnoses:  Muscle spasm     Discharge Instructions     Blood work pending to check electrolytes Try baclofen every 8 hours for spasming Follow up with oncology   ED Prescriptions    Medication Sig Dispense Auth. Provider   baclofen 5 MG TABS Take 5-10 mg by mouth 3 (three) times daily. 30 tablet Margree Gimbel C, PA-C   baclofen (LIORESAL) 10 MG tablet Take 1 tablet (10 mg total) by mouth 3 (three) times daily. 30 each Darra Rosa C, PA-C     PDMP not reviewed this encounter.   Janith Lima, [place] 02/05/20 1610

## 2020-02-05 NOTE — Telephone Encounter (Signed)
[facility] to cancel Rx for baclofen, but no answer.  Pt had requested Rx be sent to [facility] on [address], which was completed.

## 2020-02-05 NOTE — ED Triage Notes (Signed)
Pt presents to ED POV. Pt c/o body spasms. Pt reports his whole body started twitching today. Pt says that his arms twitching and his legs do. Arm twitching observed in triage. Pt also reports, no appetite, he says has been noncompliant with meds for 4 days.

## 2020-02-05 NOTE — ED Notes (Signed)
Called twice for vitals. No answer

## 2020-02-05 NOTE — Telephone Encounter (Signed)
Unable to get in contact with patient directly with phone number on file, contacted daughter, daughter verified birthday, discussed low-sodium and recommended hydration/fluids, Gatorade.  Do not suspect this to be sole cause of muscle spasticity, continue muscle relaxer, follow-up in emergency room if developing any worsening or changing symptoms.  Follow-up with oncology as planned to discuss possible side effect related to chemo causing muscle spasming.  Answered questions, daughter expresses intent to relay message to her father.

## 2020-02-05 NOTE — ED Triage Notes (Signed)
Pt present muscle spasm. Symptoms started yesterday. Pt states that he cannot stop his body from shaking.

## 2020-02-05 NOTE — Discharge Instructions (Signed)
Blood work pending to check electrolytes Try baclofen every 8 hours for spasming Follow up with oncology

## 2020-02-06 ENCOUNTER — Other Ambulatory Visit (HOSPITAL_COMMUNITY): Payer: Self-pay | Admitting: Internal Medicine

## 2020-02-06 ENCOUNTER — Telehealth (HOSPITAL_COMMUNITY): Payer: Self-pay | Admitting: Licensed Clinical Social Worker

## 2020-02-06 ENCOUNTER — Other Ambulatory Visit (HOSPITAL_COMMUNITY): Payer: Self-pay | Admitting: Adult Health

## 2020-02-06 NOTE — Telephone Encounter (Signed)
CSW received email from [name] that pt has been approved for a free eye exam through their program.  CSW called pt and informed.  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  Pt reports no further needs at this time- will continue to follow and assist as needed  [name], [place] Worker [facility] Desk#: [phone] Cell#: [phone]

## 2020-02-11 ENCOUNTER — Encounter (HOSPITAL_COMMUNITY): Payer: Self-pay | Admitting: Emergency Medicine

## 2020-02-11 ENCOUNTER — Inpatient Hospital Stay (HOSPITAL_COMMUNITY)
Admission: EM | Admit: 2020-02-11 | Discharge: 2020-02-13 | DRG: 871 | Disposition: A | Discharge status: Home / Self Care | Payer: Medicare Other | Attending: Internal Medicine | Admitting: Internal Medicine

## 2020-02-11 ENCOUNTER — Other Ambulatory Visit: Payer: Self-pay

## 2020-02-11 ENCOUNTER — Encounter (HOSPITAL_COMMUNITY): Payer: Self-pay

## 2020-02-11 ENCOUNTER — Ambulatory Visit (INDEPENDENT_AMBULATORY_CARE_PROVIDER_SITE_OTHER): Payer: Medicare Other

## 2020-02-11 ENCOUNTER — Ambulatory Visit (INDEPENDENT_AMBULATORY_CARE_PROVIDER_SITE_OTHER)
Admission: EM | Admit: 2020-02-11 | Discharge: 2020-02-11 | Disposition: A | Discharge status: Home / Self Care | Payer: Medicare Other | Source: Home / Self Care

## 2020-02-11 DIAGNOSIS — Z87891 Personal history of nicotine dependence: Secondary | ICD-10-CM

## 2020-02-11 DIAGNOSIS — F1010 Alcohol abuse, uncomplicated: Secondary | ICD-10-CM | POA: Diagnosis not present

## 2020-02-11 DIAGNOSIS — R0602 Shortness of breath: Secondary | ICD-10-CM

## 2020-02-11 DIAGNOSIS — C184 Malignant neoplasm of transverse colon: Secondary | ICD-10-CM

## 2020-02-11 DIAGNOSIS — C169 Malignant neoplasm of stomach, unspecified: Secondary | ICD-10-CM

## 2020-02-11 DIAGNOSIS — Z841 Family history of disorders of kidney and ureter: Secondary | ICD-10-CM

## 2020-02-11 DIAGNOSIS — J449 Chronic obstructive pulmonary disease, unspecified: Secondary | ICD-10-CM

## 2020-02-11 DIAGNOSIS — R05 Cough: Secondary | ICD-10-CM | POA: Diagnosis not present

## 2020-02-11 DIAGNOSIS — J189 Pneumonia, unspecified organism: Secondary | ICD-10-CM

## 2020-02-11 DIAGNOSIS — N189 Chronic kidney disease, unspecified: Secondary | ICD-10-CM

## 2020-02-11 DIAGNOSIS — Z8709 Personal history of other diseases of the respiratory system: Secondary | ICD-10-CM

## 2020-02-11 DIAGNOSIS — R1110 Vomiting, unspecified: Secondary | ICD-10-CM

## 2020-02-11 DIAGNOSIS — Z20822 Contact with and (suspected) exposure to covid-19: Secondary | ICD-10-CM | POA: Diagnosis present

## 2020-02-11 DIAGNOSIS — Z79899 Other long term (current) drug therapy: Secondary | ICD-10-CM

## 2020-02-11 DIAGNOSIS — J441 Chronic obstructive pulmonary disease with (acute) exacerbation: Secondary | ICD-10-CM | POA: Diagnosis present

## 2020-02-11 DIAGNOSIS — C189 Malignant neoplasm of colon, unspecified: Secondary | ICD-10-CM

## 2020-02-11 DIAGNOSIS — D649 Anemia, unspecified: Secondary | ICD-10-CM | POA: Diagnosis present

## 2020-02-11 DIAGNOSIS — A419 Sepsis, unspecified organism: Principal | ICD-10-CM

## 2020-02-11 DIAGNOSIS — N179 Acute kidney failure, unspecified: Secondary | ICD-10-CM | POA: Diagnosis not present

## 2020-02-11 DIAGNOSIS — E871 Hypo-osmolality and hyponatremia: Secondary | ICD-10-CM | POA: Diagnosis present

## 2020-02-11 DIAGNOSIS — R062 Wheezing: Secondary | ICD-10-CM

## 2020-02-11 DIAGNOSIS — Z8249 Family history of ischemic heart disease and other diseases of the circulatory system: Secondary | ICD-10-CM

## 2020-02-11 DIAGNOSIS — Z825 Family history of asthma and other chronic lower respiratory diseases: Secondary | ICD-10-CM

## 2020-02-11 DIAGNOSIS — N182 Chronic kidney disease, stage 2 (mild): Secondary | ICD-10-CM

## 2020-02-11 DIAGNOSIS — D631 Anemia in chronic kidney disease: Secondary | ICD-10-CM | POA: Diagnosis present

## 2020-02-11 DIAGNOSIS — I5042 Chronic combined systolic (congestive) and diastolic (congestive) heart failure: Secondary | ICD-10-CM

## 2020-02-11 DIAGNOSIS — R059 Cough, unspecified: Secondary | ICD-10-CM

## 2020-02-11 DIAGNOSIS — Z7951 Long term (current) use of inhaled steroids: Secondary | ICD-10-CM

## 2020-02-11 DIAGNOSIS — Z9049 Acquired absence of other specified parts of digestive tract: Secondary | ICD-10-CM

## 2020-02-11 HISTORY — DX: COVID-19: U07.1

## 2020-02-11 LAB — URINALYSIS, ROUTINE W REFLEX MICROSCOPIC
Bilirubin Urine: NEGATIVE
Glucose, UA: NEGATIVE mg/dL
Hgb urine dipstick: NEGATIVE
Ketones, ur: NEGATIVE mg/dL
Leukocytes,Ua: NEGATIVE
Nitrite: NEGATIVE
Protein, ur: NEGATIVE mg/dL
Specific Gravity, Urine: 1.005 (ref 1.005–1.030)
pH: 6 (ref 5.0–8.0)

## 2020-02-11 LAB — COMPREHENSIVE METABOLIC PANEL
ALT: 31 U/L (ref 0–44)
AST: 43 U/L — ABNORMAL HIGH (ref 15–41)
Albumin: 3.3 g/dL — ABNORMAL LOW (ref 3.5–5.0)
Alkaline Phosphatase: 117 U/L (ref 38–126)
Anion gap: 11 (ref 5–15)
BUN: 12 mg/dL (ref 8–23)
CO2: 20 mmol/L — ABNORMAL LOW (ref 22–32)
Calcium: 9.3 mg/dL (ref 8.9–10.3)
Chloride: 102 mmol/L (ref 98–111)
Creatinine, Ser: 1.35 mg/dL — ABNORMAL HIGH (ref 0.61–1.24)
GFR calc Af Amer: >60 mL/min (ref >60)
GFR calc non Af Amer: 54 mL/min — ABNORMAL LOW (ref >60)
Glucose, Bld: 102 mg/dL — ABNORMAL HIGH (ref 70–99)
Potassium: 3.9 mmol/L (ref 3.5–5.1)
Sodium: 133 mmol/L — ABNORMAL LOW (ref 135–145)
Total Bilirubin: 0.6 mg/dL (ref 0.3–1.2)
Total Protein: 7 g/dL (ref 6.5–8.1)

## 2020-02-11 LAB — CBC
HCT: 36.6 % — ABNORMAL LOW (ref 39.0–52.0)
Hemoglobin: 11.9 g/dL — ABNORMAL LOW (ref 13.0–17.0)
MCH: 29.4 pg (ref 26.0–34.0)
MCHC: 32.5 g/dL (ref 30.0–36.0)
MCV: 90.4 fL (ref 80.0–100.0)
Platelets: 262 10*3/uL (ref 150–400)
RBC: 4.05 MIL/uL — ABNORMAL LOW (ref 4.22–5.81)
RDW: 16.9 % — ABNORMAL HIGH (ref 11.5–15.5)
WBC: 5.5 10*3/uL (ref 4.0–10.5)
nRBC: 0 % (ref 0.0–0.2)

## 2020-02-11 LAB — LACTIC ACID, PLASMA
Lactic Acid, Venous: 2.5 mmol/L (ref 0.5–1.9)
Lactic Acid, Venous: 1 mmol/L (ref 0.5–1.9)

## 2020-02-11 LAB — SARS CORONAVIRUS 2 BY RT PCR (HOSPITAL ORDER, PERFORMED IN ~~LOC~~ HOSPITAL LAB): SARS Coronavirus 2: NEGATIVE

## 2020-02-11 MED ORDER — SODIUM CHLORIDE 0.9 % IV SOLN: 1.0000 g | Freq: Once | INTRAVENOUS | Status: DC

## 2020-02-11 MED ORDER — ONDANSETRON HCL 4 MG/2ML IJ SOLN: 4.0000 mg | Freq: Four times a day (QID) | INTRAMUSCULAR | Status: DC | PRN

## 2020-02-11 MED ORDER — ACETAMINOPHEN 325 MG PO TABS
650.0000 mg | ORAL_TABLET | Freq: Four times a day (QID) | ORAL | Status: DC | PRN
  Administered 2020-02-13 (×2): 650 mg via ORAL
  Filled 2020-02-11 (×2): qty 2

## 2020-02-11 MED ORDER — LACTATED RINGERS IV BOLUS
1000.0000 mL | Freq: Once | INTRAVENOUS | Status: AC
  Administered 2020-02-11: 1000 mL via INTRAVENOUS

## 2020-02-11 MED ORDER — VANCOMYCIN HCL 1250 MG/250ML IV SOLN
1250.0000 mg | Freq: Once | INTRAVENOUS | Status: AC
  Administered 2020-02-11: 1250 mg via INTRAVENOUS
  Filled 2020-02-11: qty 250

## 2020-02-11 MED ORDER — SODIUM CHLORIDE 0.9 % IV SOLN
2.0000 g | Freq: Two times a day (BID) | INTRAVENOUS | Status: DC
  Administered 2020-02-11 – 2020-02-12 (×2): 2 g via INTRAVENOUS
  Filled 2020-02-11 (×3): qty 2

## 2020-02-11 MED ORDER — ACETAMINOPHEN 500 MG PO TABS
1000.0000 mg | ORAL_TABLET | Freq: Once | ORAL | Status: AC
  Administered 2020-02-11: 1000 mg via ORAL
  Filled 2020-02-11: qty 2

## 2020-02-11 MED ORDER — ENOXAPARIN SODIUM 40 MG/0.4ML ~~LOC~~ SOLN
40.0000 mg | SUBCUTANEOUS | Status: DC
  Administered 2020-02-11 – 2020-02-12 (×2): 40 mg via SUBCUTANEOUS
  Filled 2020-02-11 (×2): qty 0.4

## 2020-02-11 MED ORDER — VANCOMYCIN HCL 500 MG/100ML IV SOLN
500.0000 mg | Freq: Two times a day (BID) | INTRAVENOUS | Status: DC
  Administered 2020-02-12: 500 mg via INTRAVENOUS
  Filled 2020-02-11: qty 100

## 2020-02-11 MED ORDER — VANCOMYCIN HCL IN DEXTROSE 1-5 GM/200ML-% IV SOLN
1000.0000 mg | Freq: Once | INTRAVENOUS | Status: DC
  Filled 2020-02-11: qty 200

## 2020-02-11 MED ORDER — SODIUM CHLORIDE 0.9 % IV SOLN: 500.0000 mg | Freq: Once | INTRAVENOUS | Status: DC

## 2020-02-11 NOTE — ED Notes (Signed)
[phone] Sharod son would like an update on the pt

## 2020-02-11 NOTE — ED Notes (Signed)
Called RT

## 2020-02-11 NOTE — ED Provider Notes (Signed)
[facility] [facility]   MRN: [id number] DOB: 1951-01-03  Subjective:   Joe Garcia is a 69 y.o. male presenting for 4 day hx of persistent cough that is now eliciting posttussive emesis, chest congestion and shortness of breath.  Patient has history of COPD and is taking his Symbicort, Singulair consistently.  He was trying his albuterol and initially had some relief but this stopped working.  Has both the nebulizer and the albuterol inhaler, states that the inhaler provided very temporary relief better than the nebulized albuterol.  Has a history of chronic combined systolic and diastolic congestive heart failure. Last EF was 06/28/2019, 60-65%. Patient reports currently undergoing chemo therapy for colon cancer, had partial colectomy this past year. Denies chest pain, fevers.  Patient has a history of COVID-19, has also recently been vaccinated.  States that he had COVID-19 testing last week and was told it was negative but there is no record of this.  No current facility-administered medications for this encounter.  Current Outpatient Medications:    albuterol (PROVENTIL) (2.5 MG/3ML) 0.083% nebulizer solution, Take 3 mLs (2.5 mg total) by nebulization every 6 (six) hours as needed for wheezing or shortness of breath., Disp: 75 mL, Rfl: 0   albuterol (VENTOLIN HFA) 108 (90 Base) MCG/ACT inhaler, Inhale 2 puffs into the lungs every 4 (four) hours as needed for wheezing or shortness of breath., Disp: 18 g, Rfl: 0   amiodarone (PACERONE) 100 MG tablet, Take 1 tablet (100 mg total) by mouth daily., Disp: 30 tablet, Rfl: 6   baclofen (LIORESAL) 10 MG tablet, Take 1 tablet (10 mg total) by mouth 3 (three) times daily., Disp: 30 each, Rfl: 0   Baclofen 5 MG TABS, Take 5-10 mg by mouth 3 (three) times daily., Disp: 30 tablet, Rfl: 0   budesonide-formoterol (SYMBICORT) 160-4.5 MCG/ACT inhaler, Inhale 2 puffs into the lungs 2 (two) times daily., Disp: 1 Inhaler, Rfl: 11   carvedilol (COREG)  3.125 MG tablet, Take 1 tablet (3.125 mg total) by mouth 2 (two) times daily with a meal., Disp: 60 tablet, Rfl: 5   fluticasone (FLONASE) 50 MCG/ACT nasal spray, Place 2 sprays into both nostrils daily., Disp: 16 g, Rfl: 0   furosemide (LASIX) 20 MG tablet, Take 1 tablet (20 mg total) by mouth daily. Needs appt for further refills, Disp: 30 tablet, Rfl: 5   isosorbide-hydrALAZINE (BIDIL) 20-37.5 MG tablet, Take 0.5 tablets by mouth 3 (three) times daily. , Disp: , Rfl:    loperamide (IMODIUM) 2 MG capsule, Take 2-4 mg by mouth 4 (four) times daily as needed., Disp: , Rfl:    losartan (COZAAR) 25 MG tablet, Take 1 tablet (25 mg total) by mouth daily. NEEDS APPT FOR FURTHER REFILLS, Disp: 30 tablet, Rfl: 5   montelukast (SINGULAIR) 10 MG tablet, Take 10 mg by mouth at bedtime., Disp: , Rfl:    prochlorperazine (COMPAZINE) 10 MG tablet, Take 10 mg by mouth every 6 (six) hours as needed for nausea or vomiting., Disp: , Rfl:    spironolactone (ALDACTONE) 25 MG tablet, Take 1 tablet (25 mg total) by mouth daily. NEEDS APPT FOR FURTHER REFILLS, Disp: 30 tablet, Rfl: 5   Allergies  Allergen Reactions   Lisinopril Swelling    Facial and tongue swelling 10/2012    Past Medical History:  Diagnosis Date   Alcoholism ([place])    Arthritis    hips, L shoulder   Asthma    Cancer ([place])    colon cancer   CHF (congestive  heart failure) ([place])    Chronic kidney disease    only has one kidney   COVID-19    Depression    pt denies   Dyspnea    Dysrhythmia    PVC's   Family history of anesthesia complication    GERD (gastroesophageal reflux disease)    Hyperlipidemia    Hypertension    Pneumonia      Past Surgical History:  Procedure Laterality Date   BIOPSY  06/27/2019   Procedure: BIOPSY;  Surgeon: Yetta Flock, MD;  Location: [facility] ENDOSCOPY;  Service: Gastroenterology;;   COLONOSCOPY Left 06/27/2019   Procedure: COLONOSCOPY;  Surgeon: Yetta Flock, MD;   Location: [facility] ENDOSCOPY;  Service: Gastroenterology;  Laterality: Left;   LAPAROTOMY N/A 06/30/2019   Procedure: EXPLORATORY LAPAROTOMY;  Surgeon: Ralene Ok, MD;  Location: [place];  Service: General;  Laterality: N/A;   LEFT HEART CATH AND CORONARY ANGIOGRAPHY N/A 10/04/2018   Procedure: LEFT HEART CATH AND CORONARY ANGIOGRAPHY;  Surgeon: Jettie Booze, MD;  Location: [place] CV LAB;  Service: Cardiovascular;  Laterality: N/A;   PARTIAL COLECTOMY N/A 06/30/2019   Procedure: PARTIAL COLECTOMY;  Surgeon: Ralene Ok, MD;  Location: [place];  Service: General;  Laterality: N/A;   PORTACATH PLACEMENT Left 08/02/2019   Procedure: INSERTION PORT-A-CATH LEFT CHEST;  Surgeon: Ralene Ok, MD;  Location: [place];  Service: General;  Laterality: Left;   RIGHT/LEFT HEART CATH AND CORONARY ANGIOGRAPHY N/A 06/12/2017   Procedure: RIGHT/LEFT HEART CATH AND CORONARY ANGIOGRAPHY;  Surgeon: Jolaine Artist, MD;  Location: [place] CV LAB;  Service: Cardiovascular;  Laterality: N/A;    Family History  Problem Relation Age of Onset   Heart disease Father    Heart disease Daughter        "fluid around heart"    Canavan disease Daughter    Cancer Daughter        unsure of type   Asthma Son    Heart failure Mother    Kidney failure Mother     Social History   Tobacco Use   Smoking status: Former Smoker    Packs/day: 0.10    Years: 45.00    Pack years: 4.50    Quit date: 2012    Years since quitting: 9.5   Smokeless tobacco: Never Used  Vaping Use   Vaping Use: Never used  Substance Use Topics   Alcohol use: No    Comment: Pt states no etoh since April 2014   Drug use: No    Comment: Prior use of crack cocaine, quit 2012    ROS   Objective:   Vitals: BP (!) 128/103 (BP Location: Right Arm)    Pulse 83    Temp 98.2 F (36.8 C) (Oral)    Resp (!) 21    SpO2 99%   Physical Exam Constitutional:      General: He is not in acute distress.     Appearance: Normal appearance. He is well-developed. He is ill-appearing. He is not toxic-appearing or diaphoretic.  HENT:     Head: Normocephalic and atraumatic.     Right Ear: External ear normal.     Left Ear: External ear normal.     Nose: Nose normal.     Mouth/Throat:     Mouth: Mucous membranes are moist.     Pharynx: Oropharynx is clear.  Eyes:     General: No scleral icterus.    Extraocular Movements: Extraocular movements intact.  Pupils: Pupils are equal, round, and reactive to light.  Cardiovascular:     Rate and Rhythm: Normal rate and regular rhythm.     Heart sounds: Normal heart sounds. No murmur heard.  No friction rub. No gallop.   Pulmonary:     Effort: Pulmonary effort is normal. No respiratory distress.     Breath sounds: No stridor. Wheezing (mild superiorly) present. No rhonchi or rales.  Skin:    General: Skin is warm and dry.  Neurological:     Mental Status: He is alert and oriented to person, place, and time.  Psychiatric:        Mood and Affect: Mood normal.        Behavior: Behavior normal.        Thought Content: Thought content normal.     DG Chest 2 View  Result Date: 02/11/2020 CLINICAL DATA:  Per pt: cough for four days. Stomach/colon cancer, finish the treatments last week, had the shakes went to a cough. No fever, sore throat. History of COPD and Asthma. EXAM: CHEST - 2 VIEW COMPARISON:  Chest radiograph 09/20/2019 FINDINGS: Stable cardiomediastinal contours. Left chest port remains in place. There are diffuse bilateral patchy airspace opacities. The lungs are hyperinflated. No pneumothorax or pleural effusion. No acute finding in the visualized skeleton. IMPRESSION: Diffuse bilateral patchy airspace opacities which are nonspecific but suspicious for multifocal infection. Recommend follow-up to resolution. Electronically Signed   By: Audie Pinto M.D.   On: 02/11/2020 13:44    Assessment and Plan :   PDMP not reviewed this  encounter.  1. Cough   2. Shortness of breath   3. Wheezing   4. Chronic obstructive pulmonary disease, unspecified COPD type ([place])   5. Post-tussive emesis   6. Malignant neoplasm of colon, unspecified part of colon [facility])     Patient has multifocal pneumonia and is a very high risk patient given his ongoing colon cancer undergoing chemo treatments, COPD, nonischemic cardiomyopathy and chronic congestive heart failure.  I recommended patient report to the emergency room for consideration of admission and or monitoring, further management of his multifocal pneumonia given his risk factors.  Patient is in agreement and will report to the emergency room now.   Jaynee Eagles, PA-C 02/11/20 1413

## 2020-02-11 NOTE — H&P (Signed)
History and Physical    Joe Garcia HWE:[id number] DOB: 1950-08-04 DOA: 02/11/2020  PCP: Medicine, [facility]  Patient coming from: Home  I have personally briefly reviewed patient's old medical records in [place]  Chief Complaint: Shortness of breath, nausea vomiting diarrhea  HPI: Joe Garcia is a 69 y.o. male with medical history significant for colon cancer s/p partial colectomy on chemotherapy, COPD, combined systolic and diastolic CHF with EF of 60 to 65% in 06/2019, CKD stage II, history of alcohol abuse who presents with shortness of breath, nausea vomiting diarrhea and was sent by urgent care for concerns of multifocal pneumonia..  About 4 days ago, patient notes that he had continuous coughing as well as posttussive emesis.  Also had dyspnea with exertion.  He uses entire albuterol inhaler in the past few days.  Also notes nausea vomiting and diarrhea with food and some slight right lower abdominal pain that is worse with vomiting.  Denies any fevers although states that he had chills last week and was just prescribed muscle relaxer at urgent care.  Denies any bloody diarrhea or bloody vomitus.  He also notes dysuria for the past 4 days.  His last chemo session was 2 weeks ago. Denies any sick contact.  Patient presented to urgent care today and had chest x-ray showing bilateral patchy opacity suspicious for multifocal pneumonia and was sent to ED for further eval.  Denies tobacco, alcohol illicit drug use.  ED Course: He was febrile up to 101.7, tachycardic on room air. No leukocytosis.  Stable normocytic anemia with hemoglobin of 11.9. Lactate initially of 2.5 with improvement down to 1 following 1 L of LR bolus. Sodium of 133, glucose 102, creatinine of 1.35 with baseline around 1.2.  Mildly elevated AST of 43 but overall normal LFTs. Negative COVID PCR. UA is pending.   Review of Systems:  Constitutional: No Weight Change, No  Fever ENT/Mouth: No sore throat, No Rhinorrhea Eyes: No Eye Pain, No Vision Changes Cardiovascular: No Chest Pain,+ SOB Respiratory: + Cough, + Sputum, No Wheezing, + Dyspnea  Gastrointestinal: + Nausea, + Vomiting, + Diarrhea, No Constipation, + Pain Genitourinary: no Urinary Incontinence, No Urgency, No Flank Pain Musculoskeletal: No Arthralgias, No Myalgias Skin: No Skin Lesions, No Pruritus, Neuro: no Weakness, No Numbness,   Psych: No Anxiety/Panic, No Depression, no decrease appetite Heme/Lymph: No Bruising, No Bleeding  Past Medical History:  Diagnosis Date  . Alcoholism ([place])   . Arthritis    hips, L shoulder  . Asthma   . Cancer [facility])    colon cancer  . CHF (congestive heart failure) ([place])   . Chronic kidney disease    only has one kidney  . COVID-19   . Depression    pt denies  . Dyspnea   . Dysrhythmia    PVC's  . Family history of anesthesia complication   . GERD (gastroesophageal reflux disease)   . Hyperlipidemia   . Hypertension   . Pneumonia     Past Surgical History:  Procedure Laterality Date  . BIOPSY  06/27/2019   Procedure: BIOPSY;  Surgeon: Yetta Flock, MD;  Location: [facility] ENDOSCOPY;  Service: Gastroenterology;;  . COLONOSCOPY Left 06/27/2019   Procedure: COLONOSCOPY;  Surgeon: Yetta Flock, MD;  Location: [place];  Service: Gastroenterology;  Laterality: Left;  . LAPAROTOMY N/A 06/30/2019   Procedure: EXPLORATORY LAPAROTOMY;  Surgeon: Ralene Ok, MD;  Location: [place];  Service: General;  Laterality: N/A;  . LEFT  HEART CATH AND CORONARY ANGIOGRAPHY N/A 10/04/2018   Procedure: LEFT HEART CATH AND CORONARY ANGIOGRAPHY;  Surgeon: Jettie Booze, MD;  Location: [place] CV LAB;  Service: Cardiovascular;  Laterality: N/A;  . PARTIAL COLECTOMY N/A 06/30/2019   Procedure: PARTIAL COLECTOMY;  Surgeon: Ralene Ok, MD;  Location: [place];  Service: General;  Laterality: N/A;  . PORTACATH PLACEMENT Left 08/02/2019    Procedure: INSERTION PORT-A-CATH LEFT CHEST;  Surgeon: Ralene Ok, MD;  Location: [place];  Service: General;  Laterality: Left;  . RIGHT/LEFT HEART CATH AND CORONARY ANGIOGRAPHY N/A 06/12/2017   Procedure: RIGHT/LEFT HEART CATH AND CORONARY ANGIOGRAPHY;  Surgeon: Jolaine Artist, MD;  Location: [place] CV LAB;  Service: Cardiovascular;  Laterality: N/A;     reports that he quit smoking about 9 years ago. He has a 4.50 pack-year smoking history. He has never used smokeless tobacco. He reports that he does not drink alcohol and does not use drugs.  Allergies  Allergen Reactions  . Lisinopril Swelling    Facial and tongue swelling 10/2012    Family History  Problem Relation Age of Onset  . Heart disease Father   . Heart disease Daughter        "fluid around heart"   . Canavan disease Daughter   . Cancer Daughter        unsure of type  . Asthma Son   . Heart failure Mother   . Kidney failure Mother      Prior to Admission medications   Medication Sig Start Date End Date Taking? Authorizing Provider  albuterol (PROVENTIL) (2.5 MG/3ML) 0.083% nebulizer solution Take 3 mLs (2.5 mg total) by nebulization every 6 (six) hours as needed for wheezing or shortness of breath. 12/20/19   Owens Shark, NP  albuterol (VENTOLIN HFA) 108 (90 Base) MCG/ACT inhaler Inhale 2 puffs into the lungs every 4 (four) hours as needed for wheezing or shortness of breath. 12/20/19   Owens Shark, NP  amiodarone (PACERONE) 100 MG tablet Take 1 tablet (100 mg total) by mouth daily. 06/27/19   Larey Dresser, MD  baclofen (LIORESAL) 10 MG tablet Take 1 tablet (10 mg total) by mouth 3 (three) times daily. 02/05/20   Wieters, Hallie C, PA-C  Baclofen 5 MG TABS Take 5-10 mg by mouth 3 (three) times daily. 02/05/20   Wieters, Hallie C, PA-C  budesonide-formoterol (SYMBICORT) 160-4.5 MCG/ACT inhaler Inhale 2 puffs into the lungs 2 (two) times daily. 11/23/18   Tanda Rockers, MD  carvedilol (COREG) 3.125 MG  tablet Take 1 tablet (3.125 mg total) by mouth 2 (two) times daily with a meal. 04/15/19   Bensimhon, Shaune Pascal, MD  fluticasone (FLONASE) 50 MCG/ACT nasal spray Place 2 sprays into both nostrils daily. 02/23/18   Melynda Ripple, MD  furosemide (LASIX) 20 MG tablet Take 1 tablet (20 mg total) by mouth daily. Needs appt for further refills 01/02/20   Clegg, Amy D, NP  isosorbide-hydrALAZINE (BIDIL) 20-37.5 MG tablet Take 0.5 tablets by mouth 3 (three) times daily.     [provider]  loperamide (IMODIUM) 2 MG capsule Take 2-4 mg by mouth 4 (four) times daily as needed. 11/14/19   [provider]  losartan (COZAAR) 25 MG tablet Take 1 tablet (25 mg total) by mouth daily. NEEDS APPT FOR FURTHER REFILLS 02/07/20   Bensimhon, Shaune Pascal, MD  montelukast (SINGULAIR) 10 MG tablet Take 10 mg by mouth at bedtime.    [provider]  prochlorperazine (  COMPAZINE) 10 MG tablet Take 10 mg by mouth every 6 (six) hours as needed for nausea or vomiting.    [provider]  spironolactone (ALDACTONE) 25 MG tablet Take 1 tablet (25 mg total) by mouth daily. NEEDS APPT FOR FURTHER REFILLS 02/07/20   Darrick Grinder D, NP  loratadine (CLARITIN) 10 MG tablet Take 10 mg by mouth daily. Patient not taking: Reported on 01/31/2020  02/11/20  [provider]    Physical Exam: Vitals:   02/11/20 1622 02/11/20 1830 02/11/20 1850 02/11/20 1900  BP:  120/60  (!) 116/52  Pulse:  66  62  Resp:  (!) 21  18  Temp: (!) 101.7 F (38.7 C)  98.6 F (37 C)   TempSrc: Rectal  Oral   SpO2:  98%  99%  Height:        Constitutional: NAD, calm, comfortable, nontoxic appearing thin elderly male laying flat in bed Vitals:   02/11/20 1622 02/11/20 1830 02/11/20 1850 02/11/20 1900  BP:  120/60  (!) 116/52  Pulse:  66  62  Resp:  (!) 21  18  Temp: (!) 101.7 F (38.7 C)  98.6 F (37 C)   TempSrc: Rectal  Oral   SpO2:  98%  99%  Height:       Eyes: PERRL, lids and conjunctivae normal ENMT:  Mucous membranes are moist.  Neck: normal, supple Respiratory: Poor aeration with diffuse wheezing worse on the right upper and lower lobe. Normal respiratory effort on room air. No accessory muscle use.  Cardiovascular: Regular rate and rhythm, no murmurs / rubs / gallops. No extremity edema.  Left Port-A-Cath in place. Abdomen: no tenderness, no masses palpated.  No rebound tenderness, guarding or rigidity.  Bowel sounds positive.  Musculoskeletal: no clubbing / cyanosis. No joint deformity upper and lower extremities. Good ROM, no contractures. Normal muscle tone.  Skin: no rashes, lesions, ulcers. No induration Neurologic: CN 2-12 grossly intact. Sensation intact. Strength 5/5 in all 4.  Psychiatric: Normal judgment and insight. Alert and oriented x 3. Normal mood.     Labs on Admission: I have personally reviewed following labs and imaging studies  CBC: Recent Labs  Lab 02/11/20 1635  WBC 5.5  HGB 11.9*  HCT 36.6*  MCV 90.4  PLT 948   Basic Metabolic Panel: Recent Labs  Lab 02/05/20 1435 02/11/20 1635  NA 130* 133*  K 4.1 3.9  CL 99 102  CO2 20* 20*  GLUCOSE 101* 102*  BUN 14 12  CREATININE 1.19 1.35*  CALCIUM 9.3 9.3  MG 1.8  --    GFR: Estimated Creatinine Clearance: 51.7 mL/min (A) (by C-G formula based on SCr of 1.35 mg/dL (H)). Liver Function Tests: Recent Labs  Lab 02/11/20 1635  AST 43*  ALT 31  ALKPHOS 117  BILITOT 0.6  PROT 7.0  ALBUMIN 3.3*   No results for input(s): LIPASE, AMYLASE in the last 168 hours. No results for input(s): AMMONIA in the last 168 hours. Coagulation Profile: No results for input(s): INR, PROTIME in the last 168 hours. Cardiac Enzymes: No results for input(s): CKTOTAL, CKMB, CKMBINDEX, TROPONINI in the last 168 hours. BNP (last 3 results) No results for input(s): PROBNP in the last 8760 hours. HbA1C: No results for input(s): HGBA1C in the last 72 hours. CBG: No results for input(s): GLUCAP in the last 168  hours. Lipid Profile: No results for input(s): CHOL, HDL, LDLCALC, TRIG, CHOLHDL, LDLDIRECT in the last 72 hours. Thyroid Function Tests: No results for  input(s): TSH, T4TOTAL, FREET4, T3FREE, THYROIDAB in the last 72 hours. Anemia Panel: No results for input(s): VITAMINB12, FOLATE, FERRITIN, TIBC, IRON, RETICCTPCT in the last 72 hours. Urine analysis:    Component Value Date/Time   COLORURINE STRAW (A) 06/26/2019 0051   APPEARANCEUR CLEAR 06/26/2019 0051   LABSPEC 1.025 06/26/2019 0051   PHURINE 6.0 06/26/2019 0051   GLUCOSEU NEGATIVE 06/26/2019 0051   HGBUR NEGATIVE 06/26/2019 0051   BILIRUBINUR NEGATIVE 06/26/2019 0051   KETONESUR NEGATIVE 06/26/2019 0051   PROTEINUR NEGATIVE 06/26/2019 0051   UROBILINOGEN 1.0 07/16/2013 1404   NITRITE NEGATIVE 06/26/2019 0051   LEUKOCYTESUR NEGATIVE 06/26/2019 0051    Radiological Exams on Admission: DG Chest 2 View  Result Date: 02/11/2020 CLINICAL DATA:  Per pt: cough for four days. Stomach/colon cancer, finish the treatments last week, had the shakes went to a cough. No fever, sore throat. History of COPD and Asthma. EXAM: CHEST - 2 VIEW COMPARISON:  Chest radiograph 09/20/2019 FINDINGS: Stable cardiomediastinal contours. Left chest port remains in place. There are diffuse bilateral patchy airspace opacities. The lungs are hyperinflated. No pneumothorax or pleural effusion. No acute finding in the visualized skeleton. IMPRESSION: Diffuse bilateral patchy airspace opacities which are nonspecific but suspicious for multifocal infection. Recommend follow-up to resolution. Electronically Signed   By: Audie Pinto M.D.   On: 02/11/2020 13:44      Assessment/Plan Sepsis due to multifocal pneumonia with mild COPD exacerbation Patient also has GI complaints but had benign abdominal exam.  UA is pending but otherwise do not suspect an intra-abdominal process Continue IV vancomycin and cefepime.  Likely could transition to p.o. tomorrow if he  remains stable on room air and has clinical improvement Duoneb scheduled q6hr  AKI on CKD stage II creatinine of 1.35 with baseline around 1.2 Has received a liter of normal saline bolus.  Avoid nephrotoxic agent.  History of colon cancer s/p partial colectomy Follows with Dr. [name] with oncology Had last chemotherapty session on 01/31/20 and will go on scheduled surveillance    Combined chronic systolic and diastolic CHF Appears euvolemic on exam CHF with EF of 60 to 65% in 06/2019  History of alcohol abuse Patient reports that he is quit many years ago    Status is: Observation  The patient remains OBS appropriate and will d/c before 2 midnights.  Dispo: The patient is from: Home              Anticipated d/c is to: Home              Anticipated d/c date is: 1 day              Patient currently is not medically stable to d/c.         Orene Desanctis DO [facility]   If 7PM-7AM, please contact night-coverage [email or web address]   02/11/2020, 8:53 PM

## 2020-02-11 NOTE — ED Triage Notes (Addendum)
Pt reports having cough and vomiting at night for the past 4 nights. States albuterol inhaler and breathing treatment gives relief x 5 min.   Requested albuterol inhaler refill.

## 2020-02-11 NOTE — Discharge Instructions (Addendum)
You have multifocal pneumonia and are very high risk patient given your cancer treatment and your heart conditions.  I need you to report to the emergency room for consideration of admission and further management of your severe pneumonia.

## 2020-02-11 NOTE — Progress Notes (Signed)
Pharmacy Antibiotic Note  Joe Garcia is a 69 y.o. male admitted on 02/11/2020 with pneumonia and sepsis.  Pharmacy has been consulted for Cefepime and Vancomycin dosing.   Height: 5\' 11"  (180.3 cm) IBW/kg (Calculated) : 75.3  Temp (24hrs), Avg:99.3 F (37.4 C), Min:98.2 F (36.8 C), Max:101.7 F (38.7 C)  Recent Labs  Lab 02/05/20 1435 02/11/20 1635 02/11/20 2006  WBC  --  5.5  --   CREATININE 1.19 1.35*  --   LATICACIDVEN  --  2.5* 1.0    Estimated Creatinine Clearance: 51.7 mL/min (A) (by C-G formula based on SCr of 1.35 mg/dL (H)).    Allergies  Allergen Reactions   Lisinopril Swelling    Facial and tongue swelling 10/2012    Antimicrobials this admission: 7/17 Cefepime >>  7/17 Vancomycin >>   Dose adjustments this admission: N/a  Microbiology results: Pending   Plan:  - Cefepime 2g IV q12h - Vancomycin 1250mg  IV x 1 dose  - Followed by Vancomycin 500mg  IV q12h (nomogram dosing)  - Monitor patients renal function and urine output  - De-escalate ABX when appropriate   Thank you for allowing pharmacy to be a part of this patients care.  Duanne Limerick PharmD. BCPS 02/11/2020 9:03 PM

## 2020-02-11 NOTE — ED Notes (Signed)
RT at bedside.

## 2020-02-11 NOTE — ED Notes (Addendum)
3 attempts to start an IV. On final attempt this RN was able to obtain a blue culture bottle using Kurin device. IV consult placed prior for possible port access or IV. Will also update EDP on pt status. Phlebotomy collected LA and one set of cultures.

## 2020-02-11 NOTE — ED Provider Notes (Signed)
[name] EMERGENCY DEPARTMENT Provider Note   CSN: [id number] Arrival date & time: 02/11/20  1417     History Chief Complaint  Patient presents with  . Pneumonia  . Cough    Joe Garcia is a 69 y.o. male w PMHx significant for asthma, COPD, CHF (last EF 60-65%), HLD, HTN, Colon cancer on chemo who presents with cough, sob x4 days.  Patient endorses new productive cough with posttussive emesis.  Patient also endorses nausea with emesis that has led to decreased p.o. intake over the past 4 days.  Patient does note dizziness with standing.  Patient denies measuring fever but endorses chills/rigors requiring him to turn the heat on in his home.  Patient seen previously at urgent care earlier today with CXR concerning for multifocal pneumonia.  Due to patient chronic illness, on chemotherapy patient sent to ED for further evaluation.  The history is provided by the patient and medical records.       Past Medical History:  Diagnosis Date  . Alcoholism ([place])   . Arthritis    hips, L shoulder  . Asthma   . Cancer [facility])    colon cancer  . CHF (congestive heart failure) ([place])   . Chronic kidney disease    only has one kidney  . COVID-19   . Depression    pt denies  . Dyspnea   . Dysrhythmia    PVC's  . Family history of anesthesia complication   . GERD (gastroesophageal reflux disease)   . Hyperlipidemia   . Hypertension   . Pneumonia     Patient Active Problem List   Diagnosis Date Noted  . Multifocal pneumonia 02/11/2020  . Port-A-Cath in place 08/04/2019  . Chronic systolic heart failure ([place])   . Colonic obstruction ([place])   . 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 Nausea & vomiting 06/26/2019  . Colonic mass 06/26/2019  . COPD GOLD ?  11/24/2018  . Chest pain   . NSTEMI (non-ST elevated myocardial infarction) ([place]) 10/03/2018  . COPD with acute exacerbation ([place]) 07/26/2018  . ARF (acute renal failure) ([place])  07/26/2018  . Nausea and vomiting 07/26/2018  . Weight loss, unintentional 07/26/2018  . Malnutrition of moderate degree 07/16/2017  . 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 Solitary kidney, congenital 06/23/2017  . Nonischemic cardiomyopathy ([place])   . Multifocal PVCs   . Chronic combined systolic (congestive) and diastolic (congestive) heart failure ([place]) 06/11/2017  . Hypokalemia 05/05/2017  . DOE (dyspnea on exertion) 07/16/2013  . Alcohol abuse 09/25/2012  . Alcohol dependence ([place]) 09/22/2012  . Essential hypertension 03/22/2007  . DEGENERATIVE DISC DISEASE 03/22/2007  . 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 BIOPSY  06/27/2019   Procedure: BIOPSY;  Surgeon: Yetta Flock, MD;  Location: [facility] ENDOSCOPY;  Service: Gastroenterology;;  . COLONOSCOPY Left 06/27/2019   Procedure: COLONOSCOPY;  Surgeon: Yetta Flock, MD;  Location: [facility];  Service: Gastroenterology;  Laterality: Left;  . LAPAROTOMY N/A 06/30/2019   Procedure: EXPLORATORY LAPAROTOMY;  Surgeon: Ralene Ok, MD;  Location: [facility];  Service: General;  Laterality: N/A;  . LEFT HEART CATH AND CORONARY ANGIOGRAPHY N/A 10/04/2018   Procedure: LEFT HEART CATH AND CORONARY ANGIOGRAPHY;  Surgeon: Jettie Booze, MD;  Location: [facility] CV LAB;  Service: Cardiovascular;  Laterality: N/A;  . PARTIAL COLECTOMY N/A 06/30/2019   Procedure: PARTIAL COLECTOMY;  Surgeon: Ralene Ok,  MD;  Location: [facility];  Service: General;  Laterality: N/A;  . PORTACATH PLACEMENT Left 08/02/2019   Procedure: INSERTION PORT-A-CATH LEFT CHEST;  Surgeon: Ralene Ok, MD;  Location: [facility];  Service: General;  Laterality: Left;  . RIGHT/LEFT HEART CATH AND CORONARY ANGIOGRAPHY N/A 06/12/2017   Procedure: RIGHT/LEFT HEART CATH AND CORONARY ANGIOGRAPHY;  Surgeon: Jolaine Artist, MD;  Location: [facility] CV LAB;  Service: Cardiovascular;  Laterality: N/A;         Family History  Problem Relation Age of Onset  . Heart disease Father   . Heart disease Daughter        "fluid around heart"   . [place] disease Daughter   . Cancer Daughter        unsure of type  . Asthma Son   . Heart failure Mother   . Kidney failure Mother     Social History   Tobacco Use  . Smoking status: Former Smoker    Packs/day: 0.10    Years: 45.00    Pack years: 4.50    Quit date: 2012    Years since quitting: 9.5  . Smokeless tobacco: Never Used  Vaping Use  . Vaping Use: Never used  Substance Use Topics  . Alcohol use: No    Comment: Pt states no etoh since April 2014  . Drug use: No    Comment: Prior use of crack cocaine, quit 2012    Home Medications Prior to Admission medications   Medication Sig Start Date End Date Taking? Authorizing Provider  albuterol (PROVENTIL) (2.5 MG/3ML) 0.083% nebulizer solution Take 3 mLs (2.5 mg total) by nebulization every 6 (six) hours as needed for wheezing or shortness of breath. 12/20/19  Yes Owens Shark, NP  albuterol (VENTOLIN HFA) 108 (90 Base) MCG/ACT inhaler Inhale 2 puffs into the lungs every 4 (four) hours as needed for wheezing or shortness of breath. 12/20/19  Yes Owens Shark, NP  amiodarone (PACERONE) 100 MG tablet Take 1 tablet (100 mg total) by mouth daily. 06/27/19  Yes Larey Dresser, MD  baclofen (LIORESAL) 10 MG tablet Take 1 tablet (10 mg total) by mouth 3 (three) times daily. 02/05/20  Yes Wieters, Hallie C, PA-C  budesonide-formoterol (SYMBICORT) 160-4.5 MCG/ACT inhaler Inhale 2 puffs into the lungs 2 (two) times daily. 11/23/18  Yes Tanda Rockers, MD  carvedilol (COREG) 3.125 MG tablet Take 1 tablet (3.125 mg total) by mouth 2 (two) times daily with a meal. 04/15/19  Yes Bensimhon, Shaune Pascal, MD  fluticasone (FLONASE) 50 MCG/ACT nasal spray Place 2 sprays into both nostrils daily. Patient taking differently: Place 2 sprays into both nostrils daily as needed for allergies or rhinitis.   02/23/18  Yes Melynda Ripple, MD  furosemide (LASIX) 20 MG tablet Take 1 tablet (20 mg total) by mouth daily. Needs appt for further refills 01/02/20  Yes Clegg, Amy D, NP  isosorbide-hydrALAZINE (BIDIL) 20-37.5 MG tablet Take 0.5 tablets by mouth 3 (three) times daily.    Yes [provider]  losartan (COZAAR) 25 MG tablet Take 1 tablet (25 mg total) by mouth daily. NEEDS APPT FOR FURTHER REFILLS 02/07/20  Yes Bensimhon, Shaune Pascal, MD  montelukast (SINGULAIR) 10 MG tablet Take 10 mg by mouth at bedtime.   Yes [provider]  prochlorperazine (COMPAZINE) 10 MG tablet Take 10 mg by mouth every 6 (six) hours as needed for nausea or vomiting.   Yes [provider]  spironolactone (ALDACTONE) 25 MG tablet Take  1 tablet (25 mg total) by mouth daily. NEEDS APPT FOR FURTHER REFILLS 02/07/20  Yes Clegg, Amy D, NP  Baclofen 5 MG TABS Take 5-10 mg by mouth 3 (three) times daily. Patient not taking: Reported on 02/11/2020 02/05/20   Wieters, Hallie C, PA-C  loratadine (CLARITIN) 10 MG tablet Take 10 mg by mouth daily. Patient not taking: Reported on 01/31/2020  02/11/20  [provider]    Allergies    Lisinopril  Review of Systems   Review of Systems  Constitutional: Positive for appetite change, chills and diaphoresis. Negative for fever.  HENT: Negative for congestion and sore throat.   Eyes: Negative for redness and visual disturbance.  Respiratory: Positive for cough, shortness of breath and wheezing.   Cardiovascular: Negative for chest pain and leg swelling.  Gastrointestinal: Positive for nausea and vomiting. Negative for abdominal pain and blood in stool.  Genitourinary: Negative for dysuria and hematuria.  Skin: Negative for rash and wound.  Allergic/Immunologic: Positive for environmental allergies.  Neurological: Positive for light-headedness. Negative for headaches.       Lightheadedness with standing.  Psychiatric/Behavioral: Negative.     Physical  Exam Updated Vital Signs BP (!) 122/58 (BP Location: Right Arm)   Pulse 64   Temp 98.6 F (37 C) (Oral)   Resp 20   Ht 5\' 11"  (1.803 m)   SpO2 100%   BMI 20.86 kg/m   Physical Exam Vitals and nursing note reviewed.  Constitutional:      General: He is not in acute distress.    Appearance: He is underweight. He is ill-appearing. He is not toxic-appearing or diaphoretic.     Comments: Patient noted to have some chills during exam.  HENT:     Head: Normocephalic and atraumatic.     Nose: Nose normal.  Eyes:     Extraocular Movements: Extraocular movements intact.     Conjunctiva/sclera: Conjunctivae normal.  Cardiovascular:     Rate and Rhythm: Normal rate and regular rhythm.     Heart sounds: Normal heart sounds. No murmur heard.   Pulmonary:     Effort: No respiratory distress.     Breath sounds: Normal breath sounds. No wheezing or rales.  Abdominal:     Palpations: Abdomen is soft.     Tenderness: There is no abdominal tenderness. There is no guarding or rebound.  Musculoskeletal:     Right lower leg: No edema.     Left lower leg: No edema.  Skin:    General: Skin is warm.     Findings: No rash.  Neurological:     Mental Status: He is alert. Mental status is at baseline.  Psychiatric:        Mood and Affect: Mood normal.        Behavior: Behavior normal.     ED Results / Procedures / Treatments   Labs (all labs ordered are listed, but only abnormal results are displayed) Labs Reviewed  CBC - Abnormal; Notable for the following components:      Result Value   RBC 4.05 (*)    Hemoglobin 11.9 (*)    HCT 36.6 (*)    RDW 16.9 (*)    All other components within normal limits  COMPREHENSIVE METABOLIC PANEL - Abnormal; Notable for the following components:   Sodium 133 (*)    CO2 20 (*)    Glucose, Bld 102 (*)    Creatinine, Ser 1.35 (*)    Albumin 3.3 (*)    AST  43 (*)    GFR calc non Af Amer 54 (*)    All other components within normal limits   URINALYSIS, ROUTINE W REFLEX MICROSCOPIC - Abnormal; Notable for the following components:   Color, Urine STRAW (*)    All other components within normal limits  LACTIC ACID, PLASMA - Abnormal; Notable for the following components:   Lactic Acid, Venous 2.5 (*)    All other components within normal limits  SARS CORONAVIRUS 2 BY RT PCR (HOSPITAL ORDER, [facility])  CULTURE, BLOOD (ROUTINE X 2)  CULTURE, BLOOD (ROUTINE X 2)  URINE CULTURE  LACTIC ACID, PLASMA  HIV ANTIBODY (ROUTINE TESTING W REFLEX)    EKG None  Radiology DG Chest 2 View  Result Date: 02/11/2020 CLINICAL DATA:  Per pt: cough for four days. Stomach/colon cancer, finish the treatments last week, had the shakes went to a cough. No fever, sore throat. History of COPD and Asthma. EXAM: CHEST - 2 VIEW COMPARISON:  Chest radiograph 09/20/2019 FINDINGS: Stable cardiomediastinal contours. Left chest port remains in place. There are diffuse bilateral patchy airspace opacities. The lungs are hyperinflated. No pneumothorax or pleural effusion. No acute finding in the visualized skeleton. IMPRESSION: Diffuse bilateral patchy airspace opacities which are nonspecific but suspicious for multifocal infection. Recommend follow-up to resolution. Electronically Signed   By: Audie Pinto M.D.   On: 02/11/2020 13:44    Procedures Procedures (including critical care time)  Medications Ordered in ED Medications  ceFEPIme (MAXIPIME) 2 g in sodium chloride 0.9 % 100 mL IVPB (0 g Intravenous Stopped 02/11/20 2247)  enoxaparin (LOVENOX) injection 40 mg (40 mg Subcutaneous Given 02/11/20 2157)  ondansetron (ZOFRAN) injection 4 mg (has no administration in time range)  vancomycin (VANCOREADY) IVPB 500 mg/100 mL (has no administration in time range)  acetaminophen (TYLENOL) tablet 650 mg (has no administration in time range)  acetaminophen (TYLENOL) tablet 1,000 mg (1,000 mg Oral Given 02/11/20 1714)  lactated ringers  bolus 1,000 mL (0 mLs Intravenous Stopped 02/11/20 1932)  vancomycin (VANCOREADY) IVPB 1250 mg/250 mL (0 mg Intravenous Stopped 02/11/20 2041)    ED Course  I have reviewed the triage vital signs and the nursing notes.  Pertinent labs & imaging results that were available during my care of the patient were reviewed by me and considered in my medical decision making (see chart for details).    MDM Rules/Calculators/A&P                          Patient is a 69 year old male with past medical history significant for asthma, COPD, CHF (last EF 60-65%), HLD, HTN, Colon cancer on chemo who presents with cough, sob x4 days.  New productive cough.  Patient endorses posttussive emesis with nausea/vomiting and inability to tolerate p.o. for the last 4 days.  Patient denies measured fever however endorses chills/rigors.  Patient endorses wheezing that has not been improved with use of albuterol.  Patient said he used up entire albuterol canister in the past 2 days.  On arrival patient febrile to 101.7 rectally, tachycardic to HR 100s, blood pressure stable.  On exam patient ill-appearing.  Lungs clear to auscultation. No overlying skin changes. No LE edema.  Chest x-ray completed at UC earlier in the day reviewed and significant for diffuse bilateral airspace opacities concerning for multifocal pneumonia.  Tylenol, LR bolus, vanc/Zosyn initiated.  Infectious labs completed.  No leukocytosis. Cr elevated to 1.35 (baseline ~1.2). Initial lactic acid  elevated 2.5 with improvement to 1.0 following fluid administration. UA wo concern for infection. COVID negative.  Due to concern for immunosuppression on chemotherapy, AKI in the setting of decreased p.o. intake will admit patient for continued fluid rehydration, IV antibiotics.  Patient admitted in stable condition.   Final Clinical Impression(s) / ED Diagnoses Final diagnoses:  Multifocal pneumonia    Rx / DC Orders ED Discharge Orders    None        Kennyth Lose, MD 02/11/20 2303    Drenda Freeze, MD 02/12/20 2020

## 2020-02-11 NOTE — ED Triage Notes (Signed)
Pt. Stated, I was sent here by UC for pneumonia and cough. I might have to be admitted. I have colon cancer.

## 2020-02-12 ENCOUNTER — Observation Stay (HOSPITAL_COMMUNITY): Payer: Medicare Other

## 2020-02-12 DIAGNOSIS — N182 Chronic kidney disease, stage 2 (mild): Secondary | ICD-10-CM | POA: Diagnosis present

## 2020-02-12 DIAGNOSIS — Z841 Family history of disorders of kidney and ureter: Secondary | ICD-10-CM | POA: Diagnosis not present

## 2020-02-12 DIAGNOSIS — Z8249 Family history of ischemic heart disease and other diseases of the circulatory system: Secondary | ICD-10-CM | POA: Diagnosis not present

## 2020-02-12 DIAGNOSIS — J189 Pneumonia, unspecified organism: Secondary | ICD-10-CM | POA: Diagnosis present

## 2020-02-12 DIAGNOSIS — Z87891 Personal history of nicotine dependence: Secondary | ICD-10-CM | POA: Diagnosis not present

## 2020-02-12 DIAGNOSIS — Z7951 Long term (current) use of inhaled steroids: Secondary | ICD-10-CM | POA: Diagnosis not present

## 2020-02-12 DIAGNOSIS — Z20822 Contact with and (suspected) exposure to covid-19: Secondary | ICD-10-CM | POA: Diagnosis present

## 2020-02-12 DIAGNOSIS — N189 Chronic kidney disease, unspecified: Secondary | ICD-10-CM

## 2020-02-12 DIAGNOSIS — A419 Sepsis, unspecified organism: Secondary | ICD-10-CM

## 2020-02-12 DIAGNOSIS — J441 Chronic obstructive pulmonary disease with (acute) exacerbation: Secondary | ICD-10-CM | POA: Diagnosis present

## 2020-02-12 DIAGNOSIS — I5042 Chronic combined systolic (congestive) and diastolic (congestive) heart failure: Secondary | ICD-10-CM | POA: Diagnosis present

## 2020-02-12 DIAGNOSIS — C189 Malignant neoplasm of colon, unspecified: Secondary | ICD-10-CM | POA: Diagnosis present

## 2020-02-12 DIAGNOSIS — E871 Hypo-osmolality and hyponatremia: Secondary | ICD-10-CM | POA: Diagnosis present

## 2020-02-12 DIAGNOSIS — Z79899 Other long term (current) drug therapy: Secondary | ICD-10-CM | POA: Diagnosis not present

## 2020-02-12 DIAGNOSIS — N179 Acute kidney failure, unspecified: Secondary | ICD-10-CM | POA: Diagnosis present

## 2020-02-12 DIAGNOSIS — Z825 Family history of asthma and other chronic lower respiratory diseases: Secondary | ICD-10-CM | POA: Diagnosis not present

## 2020-02-12 DIAGNOSIS — D649 Anemia, unspecified: Secondary | ICD-10-CM | POA: Diagnosis present

## 2020-02-12 DIAGNOSIS — Z9049 Acquired absence of other specified parts of digestive tract: Secondary | ICD-10-CM | POA: Diagnosis not present

## 2020-02-12 DIAGNOSIS — C184 Malignant neoplasm of transverse colon: Secondary | ICD-10-CM | POA: Diagnosis not present

## 2020-02-12 DIAGNOSIS — D631 Anemia in chronic kidney disease: Secondary | ICD-10-CM | POA: Diagnosis present

## 2020-02-12 LAB — HIV ANTIBODY (ROUTINE TESTING W REFLEX): HIV Screen 4th Generation wRfx: NONREACTIVE

## 2020-02-12 LAB — GLUCOSE, CAPILLARY: Glucose-Capillary: 107 mg/dL — ABNORMAL HIGH (ref 70–99)

## 2020-02-12 MED ORDER — BACLOFEN 10 MG PO TABS: 10.0000 mg | ORAL_TABLET | Freq: Three times a day (TID) | ORAL | Status: DC

## 2020-02-12 MED ORDER — SPIRONOLACTONE 25 MG PO TABS
25.0000 mg | ORAL_TABLET | Freq: Every day | ORAL | Status: DC
  Administered 2020-02-12 – 2020-02-13 (×2): 25 mg via ORAL
  Filled 2020-02-12 (×2): qty 1

## 2020-02-12 MED ORDER — SODIUM CHLORIDE 0.9% FLUSH
10.0000 mL | INTRAVENOUS | Status: DC | PRN
  Administered 2020-02-13: 10 mL

## 2020-02-12 MED ORDER — CARVEDILOL 3.125 MG PO TABS
3.1250 mg | ORAL_TABLET | Freq: Two times a day (BID) | ORAL | Status: DC
  Administered 2020-02-12 – 2020-02-13 (×2): 3.125 mg via ORAL
  Filled 2020-02-12 (×3): qty 1

## 2020-02-12 MED ORDER — IPRATROPIUM-ALBUTEROL 0.5-2.5 (3) MG/3ML IN SOLN
3.0000 mL | Freq: Four times a day (QID) | RESPIRATORY_TRACT | Status: DC
  Administered 2020-02-12 (×2): 3 mL via RESPIRATORY_TRACT
  Filled 2020-02-12 (×5): qty 3

## 2020-02-12 MED ORDER — MONTELUKAST SODIUM 10 MG PO TABS
10.0000 mg | ORAL_TABLET | Freq: Every day | ORAL | Status: DC
  Filled 2020-02-12: qty 1

## 2020-02-12 MED ORDER — ISOSORB DINITRATE-HYDRALAZINE 20-37.5 MG PO TABS
0.5000 | ORAL_TABLET | Freq: Three times a day (TID) | ORAL | Status: DC
  Administered 2020-02-12 – 2020-02-13 (×3): 0.5 via ORAL
  Filled 2020-02-12 (×4): qty 1

## 2020-02-12 MED ORDER — CHLORHEXIDINE GLUCONATE CLOTH 2 % EX PADS
6.0000 | MEDICATED_PAD | Freq: Every day | CUTANEOUS | Status: DC
  Administered 2020-02-12 – 2020-02-13 (×2): 6 via TOPICAL

## 2020-02-12 MED ORDER — MOMETASONE FURO-FORMOTEROL FUM 200-5 MCG/ACT IN AERO
2.0000 | INHALATION_SPRAY | Freq: Two times a day (BID) | RESPIRATORY_TRACT | Status: DC
  Administered 2020-02-12: 2 via RESPIRATORY_TRACT
  Filled 2020-02-12: qty 8.8

## 2020-02-12 MED ORDER — ALBUTEROL SULFATE (2.5 MG/3ML) 0.083% IN NEBU
3.0000 mL | INHALATION_SOLUTION | RESPIRATORY_TRACT | Status: DC | PRN
  Administered 2020-02-13: 3 mL via RESPIRATORY_TRACT
  Filled 2020-02-12: qty 3

## 2020-02-12 MED ORDER — AMIODARONE HCL 200 MG PO TABS
100.0000 mg | ORAL_TABLET | Freq: Every day | ORAL | Status: DC
  Administered 2020-02-12 – 2020-02-13 (×2): 100 mg via ORAL
  Filled 2020-02-12 (×2): qty 1

## 2020-02-12 MED ORDER — AZITHROMYCIN 250 MG PO TABS
500.0000 mg | ORAL_TABLET | Freq: Every day | ORAL | Status: DC
  Administered 2020-02-12 – 2020-02-13 (×2): 500 mg via ORAL
  Filled 2020-02-12 (×2): qty 2

## 2020-02-12 MED ORDER — SODIUM CHLORIDE 0.9 % IV SOLN
1.0000 g | INTRAVENOUS | Status: DC
  Administered 2020-02-12: 1 g via INTRAVENOUS
  Filled 2020-02-12: qty 10
  Filled 2020-02-12: qty 1

## 2020-02-12 NOTE — Progress Notes (Signed)
Patient ID: Joe Garcia, male   DOB: October 06, 1950, 69 y.o.   MRN: [id number]  PROGRESS NOTE    Joe Garcia  QTM:[id number] DOB: Mar 30, 1951 DOA: 02/11/2020 PCP: Medicine, [facility]   Brief Narrative:  69 year old male with history of colon cancer status post partial colectomy on chemotherapy, COPD, chronic combined systolic and diastolic CHF with EF of 60 to 65% in 06/2019, chronic kidney disease stage II, history of alcohol abuse presented with shortness of breath, nausea, vomiting and diarrhea and was sent by urgent care for concerns for multifocal pneumonia seen on chest x-ray.  In the ED, he was febrile to 101.7 but was on room air.  There was no leukocytosis.  Covid testing was negative.  He was started on IV fluids and antibiotics.  Assessment & Plan:   Sepsis: Present on admission Multifocal pneumonia Probable COPD exacerbation -Chest x-ray on presentation showed multifocal pneumonia.  Patient was febrile with elevated lactic acid on presentation.  Currently on vancomycin and cefepime.  Will switch to oral Rocephin and Zithromax.  Follow cultures. -Continue Dulera and montelukast -Currently on room air.  Oxygen supplementation if needed. -Patient is nauseous and unable to tolerate oral this morning and is spitting up whatever he eats.  He does not think he is ready to be discharge today.  Will continue with IV antibiotics for the.  Acute kidney injury on chronic kidney disease stage II -Creatinine of 1.35 with baseline around 1.2.  No labs today.  Repeat a.m. labs.  Hyponatremia -Treated with IV fluids on presentation.  No labs today.  Monitor  Chronic combined systolic and diastolic heart failure -Appears euvolemic.  Strict input and output.  Daily weights.  Continue Coreg, isosorbide hydralazine and spironolactone  History of colon cancer status post partial colectomy -Follows with oncology/Dr. [name].  Had last chemotherapy session on 01/31/2020.   Outpatient follow-up with oncology  History of alcohol abuse -Reports that he quit years ago  Probable anemia of chronic disease -Hemoglobin stable on presentation.  Monitor.  Most likely from renal disease.     DVT prophylaxis: Lovenox Code Status: Full Family Communication: Patient at bedside Disposition Plan: Status is: Observation  The patient will require care spanning > 2 midnights and should be moved to inpatient because: IV treatments appropriate due to intensity of illness or inability to take PO  Dispo: The patient is from: Home              Anticipated d/c is to: Home              Anticipated d/c date is: 1 day              Patient currently is not medically stable to d/c.   Consultants: None  Procedures: None  Antimicrobials: Vancomycin and cefepime from 02/11/2020 onwards  Subjective: Patient seen and examined at bedside.  He feels nauseous and complains that he spits up what ever he eats this morning.  He does not feel ready to go home today.  No fever, chest pain, abdominal pain or diarrhea reported.  Feels short of breath with exertion.  Objective: Vitals:   02/11/20 2158 02/11/20 2359 02/12/20 0511 02/12/20 0803  BP: (!) 122/58 100/80 (!) 145/72 133/73  Pulse: 64 (!) 56 78 68  Resp: 20 16 17 18   Temp:   98.1 F (36.7 C) 100 F (37.8 C)  TempSrc:   Oral Oral  SpO2: 100% 96% 100% 100%  Height:  Intake/Output Summary (Last 24 hours) at 02/12/2020 0957 Last data filed at 02/12/2020 0100 Gross per 24 hour  Intake 1141.01 ml  Output 200 ml  Net 941.01 ml   There were no vitals filed for this visit.  Examination:  General exam: Appears calm and comfortable.  Looks older than stated age. Respiratory system: Bilateral decreased breath sounds at bases with some scattered crackles Cardiovascular system: S1 & S2 heard, intermittently bradycardic  gastrointestinal system: Abdomen is nondistended, soft and nontender. Normal bowel sounds  heard. Extremities: No cyanosis, clubbing, edema  Central nervous system: Alert and oriented. No focal neurological deficits. Moving extremities Skin: No rashes, lesions or ulcers Psychiatry: Flat affect   Data Reviewed: I have personally reviewed following labs and imaging studies  CBC: Recent Labs  Lab 02/11/20 1635  WBC 5.5  HGB 11.9*  HCT 36.6*  MCV 90.4  PLT 027   Basic Metabolic Panel: Recent Labs  Lab 02/05/20 1435 02/11/20 1635  NA 130* 133*  K 4.1 3.9  CL 99 102  CO2 20* 20*  GLUCOSE 101* 102*  BUN 14 12  CREATININE 1.19 1.35*  CALCIUM 9.3 9.3  MG 1.8  --    GFR: Estimated Creatinine Clearance: 51.7 mL/min (A) (by C-G formula based on SCr of 1.35 mg/dL (H)). Liver Function Tests: Recent Labs  Lab 02/11/20 1635  AST 43*  ALT 31  ALKPHOS 117  BILITOT 0.6  PROT 7.0  ALBUMIN 3.3*   No results for input(s): LIPASE, AMYLASE in the last 168 hours. No results for input(s): AMMONIA in the last 168 hours. Coagulation Profile: No results for input(s): INR, PROTIME in the last 168 hours. Cardiac Enzymes: No results for input(s): CKTOTAL, CKMB, CKMBINDEX, TROPONINI in the last 168 hours. BNP (last 3 results) No results for input(s): PROBNP in the last 8760 hours. HbA1C: No results for input(s): HGBA1C in the last 72 hours. CBG: No results for input(s): GLUCAP in the last 168 hours. Lipid Profile: No results for input(s): CHOL, HDL, LDLCALC, TRIG, CHOLHDL, LDLDIRECT in the last 72 hours. Thyroid Function Tests: No results for input(s): TSH, T4TOTAL, FREET4, T3FREE, THYROIDAB in the last 72 hours. Anemia Panel: No results for input(s): VITAMINB12, FOLATE, FERRITIN, TIBC, IRON, RETICCTPCT in the last 72 hours. Sepsis Labs: Recent Labs  Lab 02/11/20 1635 02/11/20 2006  LATICACIDVEN 2.5* 1.0    Recent Results (from the past 240 hour(s))  SARS Coronavirus 2 by RT PCR (hospital order, performed in [facility] hospital lab) Nasopharyngeal Nasopharyngeal  Swab     Status: None   Collection Time: 02/11/20  4:23 PM   Specimen: Nasopharyngeal Swab  Result Value Ref Range Status   SARS Coronavirus 2 NEGATIVE NEGATIVE Final    Comment: (NOTE) SARS-CoV-2 target nucleic acids are NOT DETECTED.  The SARS-CoV-2 RNA is generally detectable in upper and lower respiratory specimens during the acute phase of infection. The lowest concentration of SARS-CoV-2 viral copies this assay can detect is 250 copies / mL. A negative result does not preclude SARS-CoV-2 infection and should not be used as the sole basis for treatment or other patient management decisions.  A negative result may occur with improper specimen collection / handling, submission of specimen other than nasopharyngeal swab, presence of viral mutation(s) within the areas targeted by this assay, and inadequate number of viral copies (<250 copies / mL). A negative result must be combined with clinical observations, patient history, and epidemiological information.  Fact Sheet for Patients:   StrictlyIdeas.no  Fact Sheet for  Healthcare Providers: BankingDealers.co.za  This test is not yet approved or  cleared by the [place] and has been authorized for detection and/or diagnosis of SARS-CoV-2 by FDA under an Emergency Use Authorization (EUA).  This EUA will remain in effect (meaning this test can be used) for the duration of the COVID-19 declaration under Section 564(b)(1) of the Act, 21 U.S.C. section 360bbb-3(b)(1), unless the authorization is terminated or revoked sooner.  Performed at [facility], [place] [address], [place], [place] [address]          Radiology Studies: DG Chest 2 View  Result Date: 02/11/2020 CLINICAL DATA:  Per pt: cough for four days. Stomach/colon cancer, finish the treatments last week, had the shakes went to a cough. No fever, sore throat. History of COPD and Asthma. EXAM: CHEST - 2 VIEW  COMPARISON:  Chest radiograph 09/20/2019 FINDINGS: Stable cardiomediastinal contours. Left chest port remains in place. There are diffuse bilateral patchy airspace opacities. The lungs are hyperinflated. No pneumothorax or pleural effusion. No acute finding in the visualized skeleton. IMPRESSION: Diffuse bilateral patchy airspace opacities which are nonspecific but suspicious for multifocal infection. Recommend follow-up to resolution. Electronically Signed   By: Audie Pinto M.D.   On: 02/11/2020 13:44        Scheduled Meds: . amiodarone  100 mg Oral Daily  . carvedilol  3.125 mg Oral BID WC  . enoxaparin (LOVENOX) injection  40 mg Subcutaneous Q24H  . ipratropium-albuterol  3 mL Nebulization Q6H  . isosorbide-hydrALAZINE  0.5 tablet Oral TID  . mometasone-formoterol  2 puff Inhalation BID  . montelukast  10 mg Oral QHS  . spironolactone  25 mg Oral Daily   Continuous Infusions: . ceFEPime (MAXIPIME) IV 2 g (02/12/20 0939)  . vancomycin 500 mg (02/12/20 0800)          Aline August, MD [facility] 02/12/2020, 9:57 AM

## 2020-02-12 NOTE — Progress Notes (Signed)
°   02/12/20 1021  What Happened  Was fall witnessed? No  Was patient injured? Unsure  Patient found in bathroom  Found by Staff-comment ([place], NT)  Stated prior activity ambulating-unassisted  Follow Up  MD notified Dr. Starla Link  Time MD notified 1034  Family notified Yes - comment  Time family notified 1034  Additional tests Yes-comment  Simple treatment Other (comment)  Progress note created (see row info) Yes  Adult Fall Risk Assessment  Risk Factor Category (scoring not indicated) High fall risk per protocol (document High fall risk)  Patient Fall Risk Level High fall risk  Adult Fall Risk Interventions  Required Bundle Interventions *See Row Information* High fall risk - low, moderate, and high requirements implemented  Additional Interventions Other (Comment) (bed alarm)  Screening for Fall Injury Risk (To be completed on HIGH fall risk patients) - Assessing Need for Low Bed  Risk For Fall Injury- Low Bed Criteria None identified - Continue screening  Vitals  Temp 97.8 F (36.6 C)  Temp Source Oral  BP 106/60  MAP (mmHg) 73  BP Location Left Arm  BP Method Automatic  Patient Position (if appropriate) Sitting  Pulse Rate 67  Pulse Rate Source Monitor  Resp 18  Pain Assessment  Pain Scale 0-10  Pain Score 0  Neurological  Neuro (WDL) WDL  Level of Consciousness Alert

## 2020-02-12 NOTE — Progress Notes (Signed)
   02/12/20 1021  Vitals  Temp 97.8 F (36.6 C)  Temp Source Oral  BP 106/60  MAP (mmHg) 73  BP Location Left Arm  BP Method Automatic  Patient Position (if appropriate) Sitting  Pulse Rate 67  Pulse Rate Source Monitor  Resp 18  Level of Consciousness  Level of Consciousness Alert  Pain Assessment  Pain Scale 0-10  Pain Score 0  MEWS Score  MEWS Temp 0  MEWS Systolic 0  MEWS Pulse 0  MEWS RR 0  MEWS LOC 0  MEWS Score 0  MEWS Score Color Green

## 2020-02-13 LAB — URINE CULTURE: Culture: 10000 — AB

## 2020-02-13 LAB — CBC WITH DIFFERENTIAL/PLATELET
Abs Immature Granulocytes: 0.01 10*3/uL (ref 0.00–0.07)
Basophils Absolute: 0 10*3/uL (ref 0.0–0.1)
Basophils Relative: 1 %
Eosinophils Absolute: 0.2 10*3/uL (ref 0.0–0.5)
Eosinophils Relative: 6 %
HCT: 30.7 % — ABNORMAL LOW (ref 39.0–52.0)
Hemoglobin: 10.4 g/dL — ABNORMAL LOW (ref 13.0–17.0)
Immature Granulocytes: 0 %
Lymphocytes Relative: 25 %
Lymphs Abs: 1 10*3/uL (ref 0.7–4.0)
MCH: 30.1 pg (ref 26.0–34.0)
MCHC: 33.9 g/dL (ref 30.0–36.0)
MCV: 89 fL (ref 80.0–100.0)
Monocytes Absolute: 0.8 10*3/uL (ref 0.1–1.0)
Monocytes Relative: 19 %
Neutro Abs: 1.9 10*3/uL (ref 1.7–7.7)
Neutrophils Relative %: 49 %
Platelets: 181 10*3/uL (ref 150–400)
RBC: 3.45 MIL/uL — ABNORMAL LOW (ref 4.22–5.81)
RDW: 16.7 % — ABNORMAL HIGH (ref 11.5–15.5)
WBC: 3.9 10*3/uL — ABNORMAL LOW (ref 4.0–10.5)
nRBC: 0 % (ref 0.0–0.2)

## 2020-02-13 LAB — COMPREHENSIVE METABOLIC PANEL
ALT: 23 U/L (ref 0–44)
AST: 35 U/L (ref 15–41)
Albumin: 2.7 g/dL — ABNORMAL LOW (ref 3.5–5.0)
Alkaline Phosphatase: 94 U/L (ref 38–126)
Anion gap: 8 (ref 5–15)
BUN: 8 mg/dL (ref 8–23)
CO2: 21 mmol/L — ABNORMAL LOW (ref 22–32)
Calcium: 8.7 mg/dL — ABNORMAL LOW (ref 8.9–10.3)
Chloride: 105 mmol/L (ref 98–111)
Creatinine, Ser: 1.16 mg/dL (ref 0.61–1.24)
GFR calc Af Amer: >60 mL/min (ref >60)
GFR calc non Af Amer: >60 mL/min (ref >60)
Glucose, Bld: 104 mg/dL — ABNORMAL HIGH (ref 70–99)
Potassium: 4 mmol/L (ref 3.5–5.1)
Sodium: 134 mmol/L — ABNORMAL LOW (ref 135–145)
Total Bilirubin: 0.7 mg/dL (ref 0.3–1.2)
Total Protein: 5.9 g/dL — ABNORMAL LOW (ref 6.5–8.1)

## 2020-02-13 LAB — MAGNESIUM: Magnesium: 1.9 mg/dL (ref 1.7–2.4)

## 2020-02-13 MED ORDER — ALBUTEROL SULFATE HFA 108 (90 BASE) MCG/ACT IN AERS: 2.0000 | INHALATION_SPRAY | RESPIRATORY_TRACT | 0 refills | Status: DC | PRN

## 2020-02-13 MED ORDER — AZITHROMYCIN 500 MG PO TABS
500.0000 mg | ORAL_TABLET | Freq: Every day | ORAL | 0 refills | Status: AC
Start: 2020-02-13 — End: 2020-02-18

## 2020-02-13 MED ORDER — FLUTICASONE PROPIONATE 50 MCG/ACT NA SUSP
2.0000 | Freq: Every day | NASAL | Status: AC | PRN
Start: 2020-02-13 — End: ?

## 2020-02-13 MED ORDER — HEPARIN SOD (PORK) LOCK FLUSH 100 UNIT/ML IV SOLN
500.0000 [IU] | INTRAVENOUS | Status: AC | PRN
  Administered 2020-02-13: 500 [IU]
  Filled 2020-02-13: qty 5

## 2020-02-13 MED ORDER — CEFUROXIME AXETIL 500 MG PO TABS: 500.0000 mg | ORAL_TABLET | Freq: Two times a day (BID) | ORAL | 0 refills | Status: AC

## 2020-02-13 NOTE — Discharge Summary (Addendum)
Physician Discharge Summary  Joe Garcia TDD:[id number] DOB: [birth year] DOA: 02/11/2020  PCP: [facility]  Admit date: 02/11/2020 Discharge date: 02/13/2020  Admitted From: Home Disposition: Home  Recommendations for Outpatient Follow-up:  1. Follow up with PCP in 1 week with repeat CBC/BMP 2. Outpatient follow-up with cardiology and oncology 3. Follow up in ED if symptoms worsen or new appear   Home Health: No.  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  In the ED, he was febrile to 101.7 but was on room air.  There was no leukocytosis.  Covid testing was negative.  He was started on IV fluids and antibiotics.  In the hospitalization, his condition is improved.  He is still on room air with no leukocytosis.  Cultures have been negative so far.  He will be discharged on oral antibiotics.  Discharge Diagnoses:   Sepsis: Present on admission Multifocal pneumonia Probable COPD exacerbation -Chest x-ray on presentation showed multifocal pneumonia.  Patient was febrile with elevated lactic acid on presentation.    Started on vancomycin and cefepime on admission.  Subsequently switched to oral Rocephin and Zithromax.  Cultures negative so far. -Currently on room air.    No worsening leukocytosis or fever. -Feels much better.  Discharge patient home on oral Zithromax and Ceftin for 5 more days.  Continue home regimen for COPD.  Outpatient follow-up with PCP. -Sepsis  has resolved  Acute kidney injury on chronic kidney disease stage II -Creatinine of 1.35 with baseline around 1.2.    Creatinine 1.16 today.  Outpatient follow-up.  Hyponatremia -Treated with IV fluids on presentation.  Sodium 134 today.  Outpatient follow-up.  Chronic combined systolic and diastolic heart failure -Appears euvolemic.  Continue Coreg, isosorbide hydralazine and spironolactone.  Losartan and Lasix will remain on hold till reevaluation by PCP.  Outpatient follow-up with PCP/cardiology  History of colon cancer status post partial colectomy -Follows with oncology/Dr. Benay Garcia.  Had last chemotherapy session on 01/31/2020.  Outpatient follow-up with oncology  History of alcohol abuse -Reports that he quit years ago  Probable anemia of chronic disease -Hemoglobin stable on presentation.  Hemoglobin stable.  Outpatient follow-up.  Discharge Instructions  Discharge Instructions    Diet - low sodium heart healthy   Complete by: As directed    Increase activity slowly   Complete by: As directed      Allergies as of 02/13/2020      Reactions   Lisinopril Swelling   Facial and tongue swelling 10/2012      Medication List    STOP taking these medications   furosemide 20 MG tablet Commonly known as: LASIX   losartan 25 MG tablet Commonly known as: COZAAR     TAKE these medications   albuterol (2.5 MG/3ML) 0.083% nebulizer solution Commonly known as: PROVENTIL Take 3 mLs (2.5 mg total) by nebulization every 6 (six) hours as needed for wheezing or shortness of breath.   albuterol 108 (90 Base) MCG/ACT inhaler Commonly known as: VENTOLIN HFA Inhale 2 puffs into the lungs every 4 (four) hours  as needed for wheezing or shortness of breath.   amiodarone 100 MG tablet Commonly known as: PACERONE Take 1 tablet (100 mg total) by mouth daily.   azithromycin 500 MG tablet Commonly known as: Zithromax Take 1 tablet (500 mg total) by mouth daily for 5 days. Take 1  tablet daily for 3 days.   baclofen 10 MG tablet Commonly known as: LIORESAL Take 1 tablet (10 mg total) by mouth 3 (three) times daily. What changed: Another medication with the same name was removed. Continue taking this medication, and follow the directions you see here.   budesonide-formoterol 160-4.5 MCG/ACT inhaler Commonly known as: Symbicort Inhale 2 puffs into the lungs 2 (two) times daily.   carvedilol 3.125 MG tablet Commonly known as: COREG Take 1 tablet (3.125 mg total) by mouth 2 (two) times daily with a meal.   cefUROXime 500 MG tablet Commonly known as: CEFTIN Take 1 tablet (500 mg total) by mouth 2 (two) times daily with a meal for 5 days.   fluticasone 50 MCG/ACT nasal spray Commonly known as: FLONASE Place 2 sprays into both nostrils daily as needed for allergies or rhinitis.   isosorbide-hydrALAZINE 20-37.5 MG tablet Commonly known as: BIDIL Take 0.5 tablets by mouth 3 (three) times daily.   montelukast 10 MG tablet Commonly known as: SINGULAIR Take 10 mg by mouth at bedtime.   prochlorperazine 10 MG tablet Commonly known as: COMPAZINE Take 10 mg by mouth every 6 (six) hours as needed for nausea or vomiting.   spironolactone 25 MG tablet Commonly known as: ALDACTONE Take 1 tablet (25 mg total) by mouth daily. NEEDS APPT FOR FURTHER REFILLS       Follow-up Information    [facility]. Schedule an appointment as soon as possible for a visit in 1 week(s).   Specialty: Family Medicine Why: with repeat cbc/bmp Contact information: [address] [place] [address] [phone]              Allergies  Allergen Reactions  . Lisinopril Swelling    Facial and tongue swelling 10/2012    Consultations:  None   Procedures/Studies: DG Chest 2 View  Result Date: 02/11/2020 CLINICAL DATA:  Per pt: cough for four days. Stomach/colon cancer, finish the treatments last week, had the shakes went to a cough. No fever,  sore throat. History of COPD and Asthma. EXAM: CHEST - 2 VIEW COMPARISON:  Chest radiograph 09/20/2019 FINDINGS: Stable cardiomediastinal contours. Left chest port remains in place. There are diffuse bilateral patchy airspace opacities. The lungs are hyperinflated. No pneumothorax or pleural effusion. No acute finding in the visualized skeleton. IMPRESSION: Diffuse bilateral patchy airspace opacities which are nonspecific but suspicious for multifocal infection. Recommend follow-up to resolution. Electronically Signed   By: Audie Pinto M.D.   On: 02/11/2020 13:44   CT HEAD WO CONTRAST  Result Date: 02/12/2020 CLINICAL DATA:  Altered mental status EXAM: CT HEAD WITHOUT CONTRAST TECHNIQUE: Contiguous axial images were obtained from the base of the skull through the vertex without intravenous contrast. COMPARISON:  08/28/2007 FINDINGS: Brain: Mild cerebral atrophy. Negative for acute infarct, hemorrhage, mass. Vascular: Negative for hyperdense vessel. Atherosclerotic calcification in the cavernous carotid bilaterally. Skull: Negative Sinuses/Orbits: Mucosal edema paranasal sinuses.  Negative orbit Other: None IMPRESSION: Mild atrophy.  No acute abnormality. Electronically Signed   By: Franchot Gallo M.D.   On: 02/12/2020 11:41       Subjective: Patient seen and examined at bedside.  Denies worsening shortness of  breath, cough fever, nausea or vomiting.  Feels okay going home today. Discharge Exam: Vitals:   02/13/20 0432 02/13/20 0538  BP:  109/60  Pulse: 61 63  Resp: 18 18  Temp:  98.1 F (36.7 C)  SpO2: 100% 100%    General: Pt is alert, awake, not in acute distress.  Currently on room air. Cardiovascular: rate controlled, S1/S2 + Respiratory: bilateral decreased breath sounds at bases with some scattered crackles Abdominal: Soft, NT, ND, bowel sounds + Extremities: Trace lower extremity edema; no cyanosis    The results of significant diagnostics from this hospitalization  (including imaging, microbiology, ancillary and laboratory) are listed below for reference.     Microbiology: Recent Results (from the past 240 hour(s))  SARS Coronavirus 2 by RT PCR (hospital order, performed in [facility] hospital lab) Nasopharyngeal Nasopharyngeal Swab     Status: None   Collection Time: 02/11/20  4:23 PM   Specimen: Nasopharyngeal Swab  Result Value Ref Range Status   SARS Coronavirus 2 NEGATIVE NEGATIVE Final    Comment: (NOTE) SARS-CoV-2 target nucleic acids are NOT DETECTED.  The SARS-CoV-2 RNA is generally detectable in upper and lower respiratory specimens during the acute phase of infection. The lowest concentration of SARS-CoV-2 viral copies this assay can detect is 250 copies / mL. A negative result does not preclude SARS-CoV-2 infection and should not be used as the sole basis for treatment or other patient management decisions.  A negative result may occur with improper specimen collection / handling, submission of specimen other than nasopharyngeal swab, presence of viral mutation(s) within the areas targeted by this assay, and inadequate number of viral copies (<250 copies / mL). A negative result must be combined with clinical observations, patient history, and epidemiological information.  Fact Sheet for Patients:   StrictlyIdeas.no  Fact Sheet for Healthcare Providers: BankingDealers.co.za  This test is not yet approved or  cleared by the [place] FDA and has been authorized for detection and/or diagnosis of SARS-CoV-2 by FDA under an Emergency Use Authorization (EUA).  This EUA will remain in effect (meaning this test can be used) for the duration of the COVID-19 declaration under Section 564(b)(1) of the Act, 21 U.S.C. section 360bbb-3(b)(1), unless the authorization is terminated or revoked sooner.  Performed at [facility], [place] [address], [place], [place] [address]   Blood  culture (routine x 2)     Status: None (Preliminary result)   Collection Time: 02/11/20  4:28 PM   Specimen: BLOOD RIGHT FOREARM  Result Value Ref Range Status   Specimen Description BLOOD RIGHT FOREARM  Final   Special Requests   Final    BOTTLES DRAWN AEROBIC ONLY Blood Culture results may not be optimal due to an inadequate volume of blood received in culture bottles   Culture   Final    NO GROWTH < 24 HOURS Performed at [facility], [place] [address], [place],  [address]    Report Status PENDING  Incomplete  Blood culture (routine x 2)     Status: None (Preliminary result)   Collection Time: 02/11/20  4:34 PM   Specimen: BLOOD RIGHT HAND  Result Value Ref Range Status   Specimen Description BLOOD RIGHT HAND  Final   Special Requests   Final    BOTTLES DRAWN AEROBIC AND ANAEROBIC Blood Culture adequate volume   Culture   Final    NO GROWTH < 24 HOURS Performed at [facility], [place] [address],  [place], [place] [address]    Report Status PENDING  Incomplete  Urine Culture     Status: Abnormal   Collection Time: 02/11/20  9:59 PM   Specimen: Urine, Clean Catch  Result Value Ref Range Status   Specimen Description URINE, CLEAN CATCH  Final   Special Requests   Final    NONE Performed at [facility], [address], [place], [place] [address]    Culture (A)  Final    10,000 COLONIES/mL MULTIPLE SPECIES PRESENT, SUGGEST RECOLLECTION   Report Status 02/13/2020 FINAL  Final     Labs: BNP (last 3 results) No results for input(s): BNP in the last 8760 hours. Basic Metabolic Panel: Recent Labs  Lab 02/11/20 1635 02/13/20 0303  NA 133* 134*  K 3.9 4.0  CL 102 105  CO2 20* 21*  GLUCOSE 102* 104*  BUN 12 8  CREATININE 1.35* 1.16  CALCIUM 9.3 8.7*  MG  --  1.9   Liver Function Tests: Recent Labs  Lab 02/11/20 1635 02/13/20 0303  AST 43* 35  ALT 31 23  ALKPHOS 117 94  BILITOT 0.6 0.7  PROT 7.0 5.9*  ALBUMIN 3.3* 2.7*   No results for  input(s): LIPASE, AMYLASE in the last 168 hours. No results for input(s): AMMONIA in the last 168 hours. CBC: Recent Labs  Lab 02/11/20 1635 02/13/20 0303  WBC 5.5 3.9*  NEUTROABS  --  1.9  HGB 11.9* 10.4*  HCT 36.6* 30.7*  MCV 90.4 89.0  PLT 262 181   Cardiac Enzymes: No results for input(s): CKTOTAL, CKMB, CKMBINDEX, TROPONINI in the last 168 hours. BNP: Invalid input(s): POCBNP CBG: Recent Labs  Lab 02/12/20 1032  GLUCAP 107*   D-Dimer No results for input(s): DDIMER in the last 72 hours. Hgb A1c No results for input(s): HGBA1C in the last 72 hours. Lipid Profile No results for input(s): CHOL, HDL, LDLCALC, TRIG, CHOLHDL, LDLDIRECT in the last 72 hours. Thyroid function studies No results for input(s): TSH, T4TOTAL, T3FREE, THYROIDAB in the last 72 hours.  Invalid input(s): FREET3 Anemia work up No results for input(s): VITAMINB12, FOLATE, FERRITIN, TIBC, IRON, RETICCTPCT in the last 72 hours. Urinalysis    Component Value Date/Time   COLORURINE STRAW (A) 02/11/2020 2159   APPEARANCEUR CLEAR 02/11/2020 2159   LABSPEC 1.005 02/11/2020 2159   PHURINE 6.0 02/11/2020 2159   GLUCOSEU NEGATIVE 02/11/2020 2159   HGBUR NEGATIVE 02/11/2020 2159   BILIRUBINUR NEGATIVE 02/11/2020 2159   KETONESUR NEGATIVE 02/11/2020 2159   PROTEINUR NEGATIVE 02/11/2020 2159   UROBILINOGEN 1.0 07/16/2013 1404   NITRITE NEGATIVE 02/11/2020 2159   LEUKOCYTESUR NEGATIVE 02/11/2020 2159   Sepsis Labs Invalid input(s): PROCALCITONIN,  WBC,  LACTICIDVEN Microbiology Recent Results (from the past 240 hour(s))  SARS Coronavirus 2 by RT PCR (hospital order, performed in [facility] hospital lab) Nasopharyngeal Nasopharyngeal Swab     Status: None   Collection Time: 02/11/20  4:23 PM   Specimen: Nasopharyngeal Swab  Result Value Ref Range Status   SARS Coronavirus 2 NEGATIVE NEGATIVE Final    Comment: (NOTE) SARS-CoV-2 target nucleic acids are NOT DETECTED.  The SARS-CoV-2 RNA is  generally detectable in upper and lower respiratory specimens during the acute phase of infection. The lowest concentration of SARS-CoV-2 viral copies this assay can detect is 250 copies / mL. A negative result does not preclude SARS-CoV-2 infection and should not be used as the sole basis for treatment or other patient management decisions.  A negative result may  occur with improper specimen collection / handling, submission of specimen other than nasopharyngeal swab, presence of viral mutation(s) within the areas targeted by this assay, and inadequate number of viral copies (<250 copies / mL). A negative result must be combined with clinical observations, patient history, and epidemiological information.  Fact Sheet for Patients:   StrictlyIdeas.no  Fact Sheet for Healthcare Providers: BankingDealers.co.za  This test is not yet approved or  cleared by the [place] FDA and has been authorized for detection and/or diagnosis of SARS-CoV-2 by FDA under an Emergency Use Authorization (EUA).  This EUA will remain in effect (meaning this test can be used) for the duration of the COVID-19 declaration under Section 564(b)(1) of the Act, 21 U.S.C. section 360bbb-3(b)(1), unless the authorization is terminated or revoked sooner.  Performed at [facility], [place] [address] [place], [place] [address]   Blood culture (routine x 2)     Status: None (Preliminary result)   Collection Time: 02/11/20  4:28 PM   Specimen: BLOOD RIGHT FOREARM  Result Value Ref Range Status   Specimen Description BLOOD RIGHT FOREARM  Final   Special Requests   Final    BOTTLES DRAWN AEROBIC ONLY Blood Culture results may not be optimal due to an inadequate volume of blood received in culture bottles   Culture   Final    NO GROWTH < 24 HOURS Performed at [facility], [place] [address], [place], [place] [address]    Report Status PENDING  Incomplete   Blood culture (routine x 2)     Status: None (Preliminary result)   Collection Time: 02/11/20  4:34 PM   Specimen: BLOOD RIGHT HAND  Result Value Ref Range Status   Specimen Description BLOOD RIGHT HAND  Final   Special Requests   Final    BOTTLES DRAWN AEROBIC AND ANAEROBIC Blood Culture adequate volume   Culture   Final    NO GROWTH < 24 HOURS Performed at [facility], [place] [address], [place], [place] [address]    Report Status PENDING  Incomplete  Urine Culture     Status: Abnormal   Collection Time: 02/11/20  9:59 PM   Specimen: Urine, Clean Catch  Result Value Ref Range Status   Specimen Description URINE, CLEAN CATCH  Final   Special Requests   Final    NONE Performed at [facility], [place] [address], [place],  [address]    Culture (A)  Final    10,000 COLONIES/mL MULTIPLE SPECIES PRESENT, SUGGEST RECOLLECTION   Report Status 02/13/2020 FINAL  Final     Time coordinating discharge: 35 minutes  SIGNED:   Aline August, MD  [facility] 02/13/2020, 9:25 AM

## 2020-02-13 NOTE — Progress Notes (Signed)
Patient discharged to home with instructions. 

## 2020-02-13 NOTE — Evaluation (Signed)
Physical Therapy Evaluation Patient Details Name: Joe Garcia MRN: [id number] DOB: Aug 11, 1950 Today's Date: 02/13/2020   History of Present Illness  Joe Garcia is a 69 y.o. male with medical history significant for colon cancer s/p partial colectomy on chemotherapy, COPD, combined systolic and diastolic CHF with EF of 60 to 65% in 06/2019, CKD stage II, history of alcohol abuse who presents with shortness of breath, nausea vomiting diarrhea and was sent by urgent care for concerns of multifocal pneumonia..    Clinical Impression  Pt admitted with above. Pt with generalized weakness and deconditioning presenting with grossly 3+/5 strength x4 extremities. Pt states he was indep with AD and living alone. Pt now requires use of RW for safe ambulation. Pt did fall in the bathroom yesterday in which he doesn't recall why it happened. Pt stated 'I feel better with the walker. I'm just so weak from no eating and this PNA." Recommended HHPT as pt lives alone however pt deferred.     Follow Up Recommendations Home health PT;Supervision - Intermittent (pt deferred HHPT)    Equipment Recommendations  Rolling walker with 5" wheels (pt reports he has one)    Recommendations for Other Services       Precautions / Restrictions Precautions Precautions: Fall Precaution Comments: fell in bathroom yesterday in hospital      Mobility  Bed Mobility Overal bed mobility: Independent             General bed mobility comments: hob slightly elevated  Transfers Overall transfer level: Needs assistance Equipment used: None Transfers: Sit to/from Stand Sit to Stand: Min guard         General transfer comment: increased time, guarded  Ambulation/Gait Ambulation/Gait assistance: Min assist;Min guard Gait Distance (Feet): 150 Feet Assistive device: Rolling walker (2 wheeled);None Gait Pattern/deviations: Decreased stride length;Narrow base of support Gait velocity: slow Gait velocity  interpretation: <1.8 ft/sec, indicate of risk for recurrent falls General Gait Details: initially began amb without AD, pt with short shuffled steps holding onto bed. pt given RW, pt with improved step length and stability. Pt with significant dependence on bilat UEs, verbal cues to decrease WBing on UE and increase bilat LE WBing. vebal cues also to increase base of support as pt walking like hes on a tight rope. Pt reports 'I feel much better with the walker"  Stairs            Wheelchair Mobility    Modified Rankin (Stroke Patients Only)       Balance Overall balance assessment: Needs assistance Sitting-balance support: No upper extremity supported;Feet supported Sitting balance-Leahy Scale: Good Sitting balance - Comments: no difficulty   Standing balance support: Bilateral upper extremity supported;During functional activity Standing balance-Leahy Scale: Poor Standing balance comment: dependent on RW for safe ambulation                             Pertinent Vitals/Pain Pain Assessment: No/denies pain    Home Living Family/patient expects to be discharged to:: Private residence Living Arrangements: Alone Available Help at Discharge: Family;Available PRN/intermittently Type of Home: Apartment Home Access: Elevator     Home Layout: One level Home Equipment: Walker - 2 wheels;Cane - single point Additional Comments: pt reports "I can't depend on anyone"    Prior Function Level of Independence: Independent         Comments: reports taking a bus to grocery store     Hand Dominance  Dominant Hand: Right    Extremity/Trunk Assessment   Upper Extremity Assessment Upper Extremity Assessment: Generalized weakness (grossly 3+/5)    Lower Extremity Assessment Lower Extremity Assessment: Generalized weakness (grossly 3+/5)    Cervical / Trunk Assessment Cervical / Trunk Assessment: Kyphotic  Communication   Communication: No difficulties   Cognition Arousal/Alertness: Awake/alert Behavior During Therapy: WFL for tasks assessed/performed Overall Cognitive Status: Within Functional Limits for tasks assessed                                        General Comments General comments (skin integrity, edema, etc.): SpO2 >97% on RA s/p amb    Exercises Other Exercises Other Exercises: gave pt AROM to bilat LE exercises   Assessment/Plan    PT Assessment Patient needs continued PT services  PT Problem List Decreased strength;Decreased range of motion;Decreased activity tolerance;Decreased balance;Decreased mobility;Decreased coordination;Decreased cognition;Decreased knowledge of use of DME       PT Treatment Interventions DME instruction;Gait training;Functional mobility training;Therapeutic activities;Therapeutic exercise;Balance training    PT Goals (Current goals can be found in the Care Plan section)  Acute Rehab PT Goals PT Goal Formulation: With patient Time For Goal Achievement: 02/27/20 Potential to Achieve Goals: Good    Frequency Min 3X/week   Barriers to discharge Decreased caregiver support lives alone    Co-evaluation               AM-PAC PT "6 Clicks" Mobility  Outcome Measure Help needed turning from your back to your side while in a flat bed without using bedrails?: None Help needed moving from lying on your back to sitting on the side of a flat bed without using bedrails?: None Help needed moving to and from a bed to a chair (including a wheelchair)?: None Help needed standing up from a chair using your arms (e.g., wheelchair or bedside chair)?: None Help needed to walk in hospital room?: A Little Help needed climbing 3-5 steps with a railing? : A Little 6 Click Score: 22    End of Session Equipment Utilized During Treatment: Gait belt Activity Tolerance: Patient tolerated treatment well Patient left: in chair;with call bell/phone within reach;with chair alarm set Nurse  Communication: Mobility status PT Visit Diagnosis: Unsteadiness on feet (R26.81);Other abnormalities of gait and mobility (R26.89);Repeated falls (R29.6);Muscle weakness (generalized) (M62.81);History of falling (Z91.81);Difficulty in walking, not elsewhere classified (R26.2)    Time: 9381-0175 PT Time Calculation (min) (ACUTE ONLY): 24 min   Charges:   PT Evaluation $PT Eval Moderate Complexity: 1 Mod PT Treatments $Gait Training: 8-22 mins        Kittie Plater, PT, DPT Acute Rehabilitation Services Pager #: [phone] Office #: [phone]   Berline Lopes 02/13/2020, 8:54 AM

## 2020-02-13 NOTE — Social Work (Addendum)
10:25am- acknowledging pt defers HH PT and has rolling walker, discharging today.  8:32am- CSW acknowledging consult for SNF placement/HH/DME. Will follow for therapy recommendations needed to best determine disposition/for insurance authorization.   Westley Hummer, MSW, [facility]

## 2020-02-16 LAB — CULTURE, BLOOD (ROUTINE X 2)
Culture: NO GROWTH
Culture: NO GROWTH
Special Requests: ADEQUATE

## 2020-02-21 ENCOUNTER — Telehealth: Payer: Self-pay

## 2020-02-21 NOTE — Telephone Encounter (Signed)
Nutrition Follow-up:  Patient with colon cancer.  Has completed chemotherapy.  Noted recent hospital admission with pneumonia.   Spoke with patient via phone for nutrition follow-up.  Patient reports poor appetite.  Ate soup yesterday, few vienna sausages and piece of chicken.  Drinks water, juice and ensure when he has them.    Reports that he was planning on calling Dr [name] today and see if he could get an appointment.  Says that his hands, feet and legs are numb and have gotten worse since hospitalization.   Says that he is able to move around his house with help of walker.      Medications: reviewed  Labs: reviewed  Anthropometrics:   Weight 7/6 149 lb decreased from 157 lb on 6/8.    NUTRITION DIAGNOSIS: Unintentional weight loss continues   INTERVENTION:  Will send message to Manuela Schwartz, RN regarding patient's request to make an appointment Discussed strategies to increase calories and protein.  Discussed prepared foods that he can buy in the grocery store (chicken salad, pimento cheese, frozen dinners)     MONITORING, EVALUATION, GOAL: weight trends, intake   NEXT VISIT: as needed  Shakyla Nolley B. Zenia Resides, [place], [place] Registered Dietitian [phone] (mobile)

## 2020-02-23 ENCOUNTER — Telehealth: Payer: Self-pay | Admitting: *Deleted

## 2020-02-23 MED ORDER — GABAPENTIN 100 MG PO CAPS: 200.0000 mg | ORAL_CAPSULE | Freq: Three times a day (TID) | ORAL | 0 refills | Status: DC

## 2020-02-23 NOTE — Telephone Encounter (Addendum)
Reporting significant numbness,stinging,burning in feet and lower legs and hands. Difficult to feel his feet at times. Asking to see MD about issue. Informed him this is most likely related to his oxaliplatin w/last dose on 01/03/20.  Per Dr. [name]: Gabapentin 200 mg tid. Script called to pharmacy and his daughter was notified.

## 2020-02-24 ENCOUNTER — Other Ambulatory Visit: Payer: Self-pay | Admitting: Nurse Practitioner

## 2020-02-24 DIAGNOSIS — C184 Malignant neoplasm of transverse colon: Secondary | ICD-10-CM

## 2020-03-15 ENCOUNTER — Other Ambulatory Visit (HOSPITAL_COMMUNITY)
Admission: RE | Admit: 2020-03-15 | Discharge: 2020-03-15 | Disposition: A | Discharge status: Home / Self Care | Payer: Medicare Other | Source: Ambulatory Visit | Attending: Oncology | Admitting: Oncology

## 2020-03-15 ENCOUNTER — Inpatient Hospital Stay: Payer: Medicare Other | Attending: Nurse Practitioner

## 2020-03-15 ENCOUNTER — Inpatient Hospital Stay (HOSPITAL_BASED_OUTPATIENT_CLINIC_OR_DEPARTMENT_OTHER): Payer: Medicare Other | Admitting: Nurse Practitioner

## 2020-03-15 ENCOUNTER — Encounter: Payer: Self-pay | Admitting: Nutrition

## 2020-03-15 ENCOUNTER — Other Ambulatory Visit: Payer: Self-pay

## 2020-03-15 ENCOUNTER — Inpatient Hospital Stay: Payer: Medicare Other

## 2020-03-15 ENCOUNTER — Encounter: Payer: Self-pay | Admitting: Nurse Practitioner

## 2020-03-15 VITALS — BP 139/74 | HR 58 | Temp 97.5°F | Resp 18 | Ht 71.0 in | Wt 153.4 lb

## 2020-03-15 DIAGNOSIS — T451X5A Adverse effect of antineoplastic and immunosuppressive drugs, initial encounter: Secondary | ICD-10-CM | POA: Diagnosis not present

## 2020-03-15 DIAGNOSIS — D509 Iron deficiency anemia, unspecified: Secondary | ICD-10-CM | POA: Diagnosis not present

## 2020-03-15 DIAGNOSIS — C184 Malignant neoplasm of transverse colon: Secondary | ICD-10-CM | POA: Diagnosis present

## 2020-03-15 DIAGNOSIS — N644 Mastodynia: Secondary | ICD-10-CM | POA: Diagnosis not present

## 2020-03-15 DIAGNOSIS — Z79899 Other long term (current) drug therapy: Secondary | ICD-10-CM | POA: Diagnosis not present

## 2020-03-15 DIAGNOSIS — Z8616 Personal history of COVID-19: Secondary | ICD-10-CM | POA: Insufficient documentation

## 2020-03-15 DIAGNOSIS — N261 Atrophy of kidney (terminal): Secondary | ICD-10-CM | POA: Diagnosis not present

## 2020-03-15 DIAGNOSIS — G620 Drug-induced polyneuropathy: Secondary | ICD-10-CM | POA: Diagnosis not present

## 2020-03-15 DIAGNOSIS — Z95828 Presence of other vascular implants and grafts: Secondary | ICD-10-CM

## 2020-03-15 DIAGNOSIS — I5022 Chronic systolic (congestive) heart failure: Secondary | ICD-10-CM | POA: Insufficient documentation

## 2020-03-15 DIAGNOSIS — J45909 Unspecified asthma, uncomplicated: Secondary | ICD-10-CM | POA: Insufficient documentation

## 2020-03-15 LAB — CEA (IN HOUSE-CHCC): CEA (CHCC-In House): 1.19 ng/mL (ref 0.00–5.00)

## 2020-03-15 MED ORDER — HEPARIN SOD (PORK) LOCK FLUSH 100 UNIT/ML IV SOLN
500.0000 [IU] | Freq: Once | INTRAVENOUS | Status: AC
  Administered 2020-03-15: 500 [IU]
  Filled 2020-03-15: qty 5

## 2020-03-15 MED ORDER — SODIUM CHLORIDE 0.9% FLUSH
10.0000 mL | Freq: Once | INTRAVENOUS | Status: AC
  Administered 2020-03-15: 10 mL
  Filled 2020-03-15: qty 10

## 2020-03-15 NOTE — Progress Notes (Signed)
[place] OFFICE PROGRESS NOTE   Diagnosis: Colon cancer  INTERVAL HISTORY:   Joe Garcia returns as scheduled. He was hospitalized last month with pneumonia. Covid test negative. At present he feels well. He has persistent numbness in the hands and feet, stable. Painful at times. He continues gabapentin. He reports a good appetite. Bowels moving regularly. No bleeding. He complains of soreness at the right nipple. He recently noticed a "knot" in the right breast.  Objective:  Vital signs in last 24 hours:  Blood pressure 139/74, pulse (!) 58, temperature (!) 97.5 F (36.4 C), temperature source Oral, resp. rate 18, height _0  (1.803 m), weight 153 lb 6.4 oz (69.6 kg), SpO2 100 %.    HEENT: Neck without mass. Lymphatics: No palpable cervical, supraclavicular, axillary or inguinal lymph nodes. Resp: Lungs clear bilaterally. Cardio: Regular rate and rhythm. GI: Abdomen soft and nontender. No hepatomegaly. No mass. Vascular: No leg edema. Breast: Right breast has a slightly full appearance as compared to the left. No mass palpated in the right breast. Marked tenderness of the areola. Port-A-Cath without erythema.  Lab Results:  Lab Results  Component Value Date   WBC 3.9 (L) 02/13/2020   HGB 10.4 (L) 02/13/2020   HCT 30.7 (L) 02/13/2020   MCV 89.0 02/13/2020   PLT 181 02/13/2020   NEUTROABS 1.9 02/13/2020    Imaging:  No results found.  Medications: I have reviewed the patient's current medications.  Assessment/Plan: 1. 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 Grade 3, lymphovascular and perineural invasion present, 4/18 lymph nodes positive, positive lymph nodes mesenteric margin, tumor invades into adherent omentum, MSI high,loss of MLH1 and PMS2 expression ? CT abdomen/pelvis 06/26/2019-thickening within the distal transverse colon, severely atrophic and calcified right kidney ? CT  chest 06/28/2019-no evidence of metastatic disease, stable 3 mm right lower lobe nodule ? Cycle 1 FOLFOX 08/04/2019 (oxaliplatin 65 mg/m due to renal function) ? Plan for repeat CTs after he has completed 5 cycles of chemotherapy ? Cycle 2 FOLFOX 08/18/2019 ? Cycle 3 FOLFOX 08/31/2019, Udenyca added ? Cycle 4 FOLFOX 10/12/2019 ? Cycle 5 FOLFOX 10/25/2019 ? Stable right lower lobe nodule on chest CT 09/21/2019 ? CT abdomen/pelvis 11/07/2019-postoperative changes related to colectomy and anastomosis. No currentevidence of disease. No adenopathy. ? Cycle 6 FOLFOX 11/08/2019 ? Cycle 7 FOLFOX 11/23/2019 ? Cycle 8 FOLFOX 12/06/2019 ? Cycle 9 FOLFOX 12/20/2019 ? Case presented GI tumor conference 12/28/2018-tumor margin negative, positive lymph node at margin; no recommendation for further surgery or radiation; peripancreatic node on baseline CT not seen on April 2021 CT ? Cycle 10 FOLFOX 01/03/2020  ? Cycle 11 FOLFOX 01/17/2020-oxaliplatin and Udenyca held ? Cycle 12 FOLFOX 01/31/2020-oxaliplatin and Udenyca held, 5-FU dose reduced secondary to mucositis 2. Asthma 3. 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 Microcytic anemia, likely iron deficiency anemia secondary to #1 7. Port-A-Cath placement on 12/15/2019, Dr. Rosendo Gros 8. 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 Hospitalized with sepsis/multifocal pneumonia/probable COPD exacerbation July 2021   Disposition: Joe Garcia appears stable.  He completed the course of adjuvant chemotherapy approximately 6 weeks ago.  He has persistent neuropathy symptoms in the hands and feet related to oxaliplatin.  There is some associated pain.  He will continue gabapentin.  We referred him to Dr. Havery Moros for a colonoscopy.  He will undergo surveillance CT scans in November.  We referred him for a right  mammogram to evaluate breast complaints.  He will return for a  Port-A-Cath flush in 6 weeks.  Lab, Port-A-Cath flush, CT scans in 12 weeks.  We will see him a few days after the CT scans to review the results.  He will contact the office in the interim with any problems.  Plan reviewed with Dr. [name].    [name] ANP/GNP-BC   03/15/2020  10:10 AM

## 2020-03-15 NOTE — Patient Instructions (Signed)

## 2020-03-15 NOTE — Progress Notes (Signed)
Provided 1 complementary case of Ensure Enlive. 

## 2020-03-16 ENCOUNTER — Telehealth: Payer: Self-pay | Admitting: Nurse Practitioner

## 2020-03-16 ENCOUNTER — Other Ambulatory Visit: Payer: Medicare Other

## 2020-03-16 ENCOUNTER — Other Ambulatory Visit: Payer: Self-pay | Admitting: *Deleted

## 2020-03-16 DIAGNOSIS — Z20822 Contact with and (suspected) exposure to covid-19: Secondary | ICD-10-CM

## 2020-03-16 NOTE — Telephone Encounter (Signed)
Scheduled appointments per 8/19 los. Attempted to call patient, but voicemail was not set up. Will mail patient updated calendar with appointments dates and times.

## 2020-03-18 LAB — NOVEL CORONAVIRUS, NAA: SARS-CoV-2, NAA: NOT DETECTED

## 2020-03-18 LAB — SARS-COV-2, NAA 2 DAY TAT

## 2020-03-19 ENCOUNTER — Other Ambulatory Visit: Payer: Self-pay | Admitting: Nurse Practitioner

## 2020-03-19 DIAGNOSIS — N644 Mastodynia: Secondary | ICD-10-CM

## 2020-03-19 DIAGNOSIS — C184 Malignant neoplasm of transverse colon: Secondary | ICD-10-CM

## 2020-03-20 ENCOUNTER — Telehealth: Payer: Self-pay

## 2020-03-20 NOTE — Telephone Encounter (Signed)
Nutrition Follow-up:  Patient with colon cancer.  Patient has completed chemotherapy and followed by Dr. [name].    Spoke with patient via phone for nutrition follow-up.  Patient reports that appetite is better.  Reports that he is eating about 2 meals per day vs just few bites.  Patient has been drinking ensure, water, juice.  Reports that he has chicken salad, peanut butter, frozen dinners, canned meats and beans to eat.    Medications: reviewed  Labs: reviewed  Anthropometrics:   Weight 153 lb 6.4 oz on 8/19 increased from 149 lb on 7/6.     NUTRITION DIAGNOSIS: Unintentional weight loss improved   INTERVENTION:  Encouraged patient to continue eating high calorie, high protein, easy to prepare foods. Patient has RD contact information   NEXT VISIT: as needed  Geniva Lohnes B. Zenia Resides, [place], [place] Registered Dietitian [phone] (mobile)

## 2020-03-27 ENCOUNTER — Other Ambulatory Visit (HOSPITAL_COMMUNITY)
Admission: RE | Admit: 2020-03-27 | Discharge: 2020-03-27 | Disposition: A | Discharge status: Home / Self Care | Payer: Medicare Other | Source: Ambulatory Visit | Attending: Oncology | Admitting: Oncology

## 2020-03-27 DIAGNOSIS — C184 Malignant neoplasm of transverse colon: Secondary | ICD-10-CM | POA: Insufficient documentation

## 2020-04-04 ENCOUNTER — Other Ambulatory Visit: Payer: Self-pay

## 2020-04-04 ENCOUNTER — Ambulatory Visit
Admission: RE | Admit: 2020-04-04 | Discharge: 2020-04-04 | Disposition: A | Discharge status: Home / Self Care | Payer: Medicare Other | Source: Ambulatory Visit | Attending: Nurse Practitioner | Admitting: Nurse Practitioner

## 2020-04-04 ENCOUNTER — Ambulatory Visit: Payer: Medicare Other

## 2020-04-04 DIAGNOSIS — C184 Malignant neoplasm of transverse colon: Secondary | ICD-10-CM

## 2020-04-04 DIAGNOSIS — N644 Mastodynia: Secondary | ICD-10-CM

## 2020-04-09 ENCOUNTER — Encounter (HOSPITAL_COMMUNITY): Payer: Self-pay | Admitting: Oncology

## 2020-04-10 ENCOUNTER — Telehealth: Payer: Self-pay | Admitting: Gastroenterology

## 2020-04-10 NOTE — Telephone Encounter (Signed)
Hey Dr Havery Moros this pt had a colonoscopy done on 05/2019, pt is being referred by [facility] to have another colonoscopy done, pt is not due for one until 2030, Please advise on scheduling

## 2020-04-10 NOTE — Telephone Encounter (Signed)
This patient had a colon cancer diagnosed last year and underwent surgery and chemotherapy.  He is due for colonoscopy at this time, please proceed with scheduling him for colonoscopy at the [facility].  Thanks

## 2020-04-11 NOTE — Telephone Encounter (Signed)
[place] this is the patient I sent you a message about. Can you please book him for direct colon in the [place]? Thanks

## 2020-04-12 ENCOUNTER — Encounter: Payer: Self-pay | Admitting: Gastroenterology

## 2020-04-12 NOTE — Telephone Encounter (Signed)
Spoke with patient, patient unable to talk at this time he asked for me to call him back this afternoon.

## 2020-04-12 NOTE — Telephone Encounter (Signed)
Spoke to patient regarding colonoscopy appointment and pre-visit appointment, will mail updated letter. Pt is aware

## 2020-04-18 ENCOUNTER — Encounter: Payer: Self-pay | Admitting: Gastroenterology

## 2020-04-23 ENCOUNTER — Other Ambulatory Visit: Payer: Medicare Other

## 2020-04-23 DIAGNOSIS — Z20822 Contact with and (suspected) exposure to covid-19: Secondary | ICD-10-CM

## 2020-04-24 LAB — SARS-COV-2, NAA 2 DAY TAT

## 2020-04-24 LAB — NOVEL CORONAVIRUS, NAA: SARS-CoV-2, NAA: NOT DETECTED

## 2020-04-26 ENCOUNTER — Other Ambulatory Visit: Payer: Self-pay

## 2020-04-26 ENCOUNTER — Inpatient Hospital Stay: Payer: Medicare Other | Attending: Nurse Practitioner

## 2020-04-26 DIAGNOSIS — C184 Malignant neoplasm of transverse colon: Secondary | ICD-10-CM | POA: Insufficient documentation

## 2020-04-26 DIAGNOSIS — Z95828 Presence of other vascular implants and grafts: Secondary | ICD-10-CM

## 2020-04-26 DIAGNOSIS — Z452 Encounter for adjustment and management of vascular access device: Secondary | ICD-10-CM | POA: Insufficient documentation

## 2020-04-26 MED ORDER — SODIUM CHLORIDE 0.9% FLUSH
10.0000 mL | Freq: Once | INTRAVENOUS | Status: AC
  Administered 2020-04-26: 10 mL
  Filled 2020-04-26: qty 10

## 2020-04-26 MED ORDER — HEPARIN SOD (PORK) LOCK FLUSH 100 UNIT/ML IV SOLN
500.0000 [IU] | Freq: Once | INTRAVENOUS | Status: AC
  Administered 2020-04-26: 500 [IU]
  Filled 2020-04-26: qty 5

## 2020-05-01 ENCOUNTER — Telehealth: Payer: Self-pay

## 2020-05-02 NOTE — Telephone Encounter (Signed)
Joe Garcia,  This pt is cleared for anesthetic care at [facility].  Thanks,  Emit Kuenzel 

## 2020-05-02 NOTE — Telephone Encounter (Signed)
Joe Garcia,

## 2020-05-02 NOTE — Telephone Encounter (Signed)
Noted  

## 2020-05-09 ENCOUNTER — Other Ambulatory Visit: Payer: Self-pay

## 2020-05-09 ENCOUNTER — Ambulatory Visit (AMBULATORY_SURGERY_CENTER): Payer: Self-pay | Admitting: *Deleted

## 2020-05-09 VITALS — Ht 71.0 in | Wt 166.4 lb

## 2020-05-09 DIAGNOSIS — C184 Malignant neoplasm of transverse colon: Secondary | ICD-10-CM

## 2020-05-09 DIAGNOSIS — Z85038 Personal history of other malignant neoplasm of large intestine: Secondary | ICD-10-CM

## 2020-05-09 MED ORDER — SUPREP BOWEL PREP KIT 17.5-3.13-1.6 GM/177ML PO SOLN: 1.0000 | Freq: Once | ORAL | 0 refills | Status: AC

## 2020-05-09 NOTE — Progress Notes (Signed)
Completed covid vaccines 10-11-19  Pt is aware that care partner will wait in the car during procedure; if they feel like they will be too hot or cold to wait in the car; they may wait in the 4 th floor lobby. Patient is aware to bring only one care partner. We want them to wear a mask (we do not have any that we can provide them), practice social distancing, and we will check their temperatures when they get here.  I did remind the patient that their care partner needs to stay in the parking lot the entire time and have a cell phone available, we will call them when the pt is ready for discharge. Patient will wear mask into building.   No trouble with anesthesia, difficulty with intubation or hx/fam hx of malignant hyperthermia per pt    No egg or soy allergy  No home oxygen use   No medications for weight loss taken  Pt denies constipation issues

## 2020-05-11 ENCOUNTER — Encounter: Payer: Self-pay | Admitting: *Deleted

## 2020-05-11 NOTE — Progress Notes (Signed)
Email to [facility] to request BRAF mutation testing on case #[id number] dated 06/30/2019. 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

## 2020-05-14 ENCOUNTER — Telehealth: Payer: Self-pay

## 2020-05-14 NOTE — Telephone Encounter (Signed)
-----   Message from Owens Shark, NP sent at 05/14/2020  8:05 AM EDT ----- Please forward mammogram 04/04/20 to PCP

## 2020-05-14 NOTE — Telephone Encounter (Signed)
Faxed mammogram to PCP- [facility] per Lattie Haw NP.

## 2020-05-25 ENCOUNTER — Encounter: Payer: Self-pay | Admitting: Gastroenterology

## 2020-05-25 ENCOUNTER — Other Ambulatory Visit: Payer: Self-pay

## 2020-05-25 ENCOUNTER — Encounter: Payer: Medicare Other | Admitting: Gastroenterology

## 2020-05-25 ENCOUNTER — Ambulatory Visit (AMBULATORY_SURGERY_CENTER): Payer: Medicare Other | Admitting: Gastroenterology

## 2020-05-25 VITALS — BP 138/68 | HR 61 | Temp 97.5°F | Resp 10 | Ht 71.0 in | Wt 166.0 lb

## 2020-05-25 DIAGNOSIS — Z85038 Personal history of other malignant neoplasm of large intestine: Secondary | ICD-10-CM | POA: Diagnosis not present

## 2020-05-25 MED ORDER — SODIUM CHLORIDE 0.9 % IV SOLN: 500.0000 mL | Freq: Once | INTRAVENOUS | Status: DC

## 2020-05-25 NOTE — Patient Instructions (Signed)
Handout provided on hemorrhoids.   No cancer or polyps seen today.   Next Colonoscopy in 3 years.   YOU HAD AN ENDOSCOPIC PROCEDURE TODAY AT [facility]:   Refer to the procedure report that was given to you for any specific questions about what was found during the examination.  If the procedure report does not answer your questions, please call your gastroenterologist to clarify.  If you requested that your care partner not be given the details of your procedure findings, then the procedure report has been included in a sealed envelope for you to review at your convenience later.  YOU SHOULD EXPECT: Some feelings of bloating in the abdomen. Passage of more gas than usual.  Walking can help get rid of the air that was put into your GI tract during the procedure and reduce the bloating. If you had a lower endoscopy (such as a colonoscopy or flexible sigmoidoscopy) you may notice spotting of blood in your stool or on the toilet paper. If you underwent a bowel prep for your procedure, you may not have a normal bowel movement for a few days.  Please Note:  You might notice some irritation and congestion in your nose or some drainage.  This is from the oxygen used during your procedure.  There is no need for concern and it should clear up in a day or so.  SYMPTOMS TO REPORT IMMEDIATELY:   Following lower endoscopy (colonoscopy or flexible sigmoidoscopy):  Excessive amounts of blood in the stool  Significant tenderness or worsening of abdominal pains  Swelling of the abdomen that is new, acute  Fever of 100F or higher  For urgent or emergent issues, a gastroenterologist can be reached at any hour by calling [phone]. Do not use MyChart messaging for urgent concerns.    DIET:  We do recommend a small meal at first, but then you may proceed to your regular diet.  Drink plenty of fluids but you should avoid alcoholic beverages for 24 hours.  ACTIVITY:  You should plan to  take it easy for the rest of today and you should NOT DRIVE or use heavy machinery until tomorrow (because of the sedation medicines used during the test).    FOLLOW UP: Our staff will call the number listed on your records 48-72 hours following your procedure to check on you and address any questions or concerns that you may have regarding the information given to you following your procedure. If we do not reach you, we will leave a message.  We will attempt to reach you two times.  During this call, we will ask if you have developed any symptoms of COVID 19. If you develop any symptoms (ie: fever, flu-like symptoms, shortness of breath, cough etc.) before then, please call [phone].  If you test positive for Covid 19 in the 2 weeks post procedure, please call and report this information to [place].    If any biopsies were taken you will be contacted by phone or by letter within the next 1-3 weeks.  Please call us at [phone] if you have not heard about the biopsies in 3 weeks.    SIGNATURES/CONFIDENTIALITY: You and/or your care partner have signed paperwork which will be entered into your electronic medical record.  These signatures attest to the fact that that the information above on your After Visit Summary has been reviewed and is understood.  Full responsibility of the confidentiality of this discharge information lies with you and/or  your care-partner.

## 2020-05-25 NOTE — Progress Notes (Signed)
A/ox3, pleased with MAC, report to RN 

## 2020-05-25 NOTE — Progress Notes (Signed)
Vital signs checked by:WR  The patient states no changes in medical or surgical history since pre-visit screening on 05/09/20.

## 2020-05-25 NOTE — Op Note (Signed)
[place] Patient Name: Cloyde Oregel Procedure Date: 05/25/2020 3:23 PM MRN: [id number] Endoscopist: Remo Lipps P. Havery Moros , MD Age: 69 Referring MD:  Date of Birth: 08/19/1950 Gender: Male Account #: [id number] Procedure:                Colonoscopy Indications:              High risk colon cancer surveillance: Personal                            history of colon cancer diagnosed 05/2019. Now s/p                            surgical resection and chemotherapy Medicines:                Monitored Anesthesia Care Procedure:                Pre-Anesthesia Assessment:                           - Prior to the procedure, a History and Physical                            was performed, and patient medications and                            allergies were reviewed. The patient's tolerance of                            previous anesthesia was also reviewed. The risks                            and benefits of the procedure and the sedation                            options and risks were discussed with the patient.                            All questions were answered, and informed consent                            was obtained. Prior Anticoagulants: The patient has                            taken no previous anticoagulant or antiplatelet                            agents. ASA Grade Assessment: III - A patient with                            severe systemic disease. After reviewing the risks                            and benefits, the patient was deemed in  satisfactory condition to undergo the procedure.                           After obtaining informed consent, the colonoscope                            was passed under direct vision. Throughout the                            procedure, the patient's blood pressure, pulse, and                            oxygen saturations were monitored continuously. The                            Colonoscope was  introduced through the anus and                            advanced to the the terminal ileum, with                            identification of the appendiceal orifice and IC                            valve. The colonoscopy was performed without                            difficulty. The patient tolerated the procedure                            well. The quality of the bowel preparation was                            good. The terminal ileum, ileocecal valve,                            appendiceal orifice, and rectum were photographed. Scope In: 3:30:27 PM Scope Out: 3:45:51 PM Scope Withdrawal Time: 0 hours 14 minutes 1 second  Total Procedure Duration: 0 hours 15 minutes 24 seconds  Findings:                 The perianal and digital rectal examinations were                            normal.                           The terminal ileum appeared normal.                           There was evidence of a prior end-to-end                            colo-colonic anastomosis in the transverse colon.  This was patent and was characterized by healthy                            appearing mucosa. A portion of a buried staple was                            visualized at the anastomosis.                           Internal hemorrhoids were found during                            retroflexion. The hemorrhoids were small.                           The exam was otherwise without abnormality. No                            polyps. Complications:            No immediate complications. Estimated blood loss:                            None. Estimated Blood Loss:     Estimated blood loss: none. Impression:               - The examined portion of the ileum was normal.                           - Patent end-to-end colo-colonic anastomosis,                            characterized by healthy appearing mucosa.                           - Internal hemorrhoids.                            - The examination was otherwise normal.                           - No polyps Recommendation:           - Patient has a contact number available for                            emergencies. The signs and symptoms of potential                            delayed complications were discussed with the                            patient. Return to normal activities tomorrow.                            Written discharge instructions were provided to the  patient.                           - Resume previous diet.                           - Continue present medications.                           - Repeat colonoscopy in 3 years for surveillance. Remo Lipps P. Mikena Masoner, MD 05/25/2020 3:51:36 PM This report has been signed electronically.

## 2020-05-29 ENCOUNTER — Telehealth: Payer: Self-pay | Admitting: *Deleted

## 2020-05-29 ENCOUNTER — Telehealth: Payer: Self-pay

## 2020-05-29 NOTE — Telephone Encounter (Signed)
  Follow up Call-  Call back number 05/25/2020  Post procedure Call Back phone  # [phone]  Permission to leave phone message No  Some recent data might be hidden     No answer at 2nd attempt follow up phone call. No answer, unable to leave message.

## 2020-05-29 NOTE — Telephone Encounter (Signed)
First attempt follow up call to pt, lm on vm 

## 2020-06-06 ENCOUNTER — Telehealth: Payer: Self-pay

## 2020-06-07 ENCOUNTER — Other Ambulatory Visit: Payer: Self-pay

## 2020-06-07 ENCOUNTER — Ambulatory Visit: Payer: Medicare Other | Attending: Internal Medicine

## 2020-06-07 ENCOUNTER — Inpatient Hospital Stay: Payer: Medicare Other

## 2020-06-07 ENCOUNTER — Inpatient Hospital Stay: Payer: Medicare Other | Attending: Nurse Practitioner

## 2020-06-07 DIAGNOSIS — Z23 Encounter for immunization: Secondary | ICD-10-CM

## 2020-06-07 DIAGNOSIS — C184 Malignant neoplasm of transverse colon: Secondary | ICD-10-CM

## 2020-06-07 DIAGNOSIS — Z95828 Presence of other vascular implants and grafts: Secondary | ICD-10-CM

## 2020-06-07 LAB — CBC WITH DIFFERENTIAL (CANCER CENTER ONLY)
Abs Immature Granulocytes: 0.02 10*3/uL (ref 0.00–0.07)
Basophils Absolute: 0 10*3/uL (ref 0.0–0.1)
Basophils Relative: 1 %
Eosinophils Absolute: 0.2 10*3/uL (ref 0.0–0.5)
Eosinophils Relative: 4 %
HCT: 37.1 % — ABNORMAL LOW (ref 39.0–52.0)
Hemoglobin: 12.1 g/dL — ABNORMAL LOW (ref 13.0–17.0)
Immature Granulocytes: 0 %
Lymphocytes Relative: 39 %
Lymphs Abs: 2.3 10*3/uL (ref 0.7–4.0)
MCH: 28.9 pg (ref 26.0–34.0)
MCHC: 32.6 g/dL (ref 30.0–36.0)
MCV: 88.5 fL (ref 80.0–100.0)
Monocytes Absolute: 0.4 10*3/uL (ref 0.1–1.0)
Monocytes Relative: 7 %
Neutro Abs: 2.8 10*3/uL (ref 1.7–7.7)
Neutrophils Relative %: 49 %
Platelet Count: 205 10*3/uL (ref 150–400)
RBC: 4.19 MIL/uL — ABNORMAL LOW (ref 4.22–5.81)
RDW: 14.6 % (ref 11.5–15.5)
WBC Count: 5.7 10*3/uL (ref 4.0–10.5)
nRBC: 0 % (ref 0.0–0.2)

## 2020-06-07 LAB — CMP (CANCER CENTER ONLY)
ALT: 20 U/L (ref 0–44)
AST: 20 U/L (ref 15–41)
Albumin: 3.5 g/dL (ref 3.5–5.0)
Alkaline Phosphatase: 116 U/L (ref 38–126)
Anion gap: 7 (ref 5–15)
BUN: 24 mg/dL — ABNORMAL HIGH (ref 8–23)
CO2: 25 mmol/L (ref 22–32)
Calcium: 9 mg/dL (ref 8.9–10.3)
Chloride: 108 mmol/L (ref 98–111)
Creatinine: 1.49 mg/dL — ABNORMAL HIGH (ref 0.61–1.24)
GFR, Estimated: 51 mL/min — ABNORMAL LOW (ref >60)
Glucose, Bld: 91 mg/dL (ref 70–99)
Potassium: 4 mmol/L (ref 3.5–5.1)
Sodium: 140 mmol/L (ref 135–145)
Total Bilirubin: 0.4 mg/dL (ref 0.3–1.2)
Total Protein: 6.8 g/dL (ref 6.5–8.1)

## 2020-06-07 LAB — CEA (IN HOUSE-CHCC): CEA (CHCC-In House): 1.13 ng/mL (ref 0.00–5.00)

## 2020-06-07 MED ORDER — HEPARIN SOD (PORK) LOCK FLUSH 100 UNIT/ML IV SOLN
500.0000 [IU] | Freq: Once | INTRAVENOUS | Status: AC
  Administered 2020-06-07: 500 [IU]
  Filled 2020-06-07: qty 5

## 2020-06-07 MED ORDER — SODIUM CHLORIDE 0.9% FLUSH
10.0000 mL | Freq: Once | INTRAVENOUS | Status: AC
  Administered 2020-06-07: 10 mL
  Filled 2020-06-07: qty 10

## 2020-06-07 MED ORDER — PROCHLORPERAZINE MALEATE 10 MG PO TABS
ORAL_TABLET | ORAL | Status: AC
  Filled 2020-06-07: qty 1

## 2020-06-07 NOTE — Progress Notes (Signed)
   Covid-19 Vaccination Clinic  Name:  Joe Garcia    MRN: [id number] DOB: 03/28/1951  06/07/2020  Joe Garcia was observed post Covid-19 immunization for 15 minutes without incident. He was provided with Vaccine Information Sheet and instruction to access the V-Safe system.   Joe Garcia was instructed to call 911 with any severe reactions post vaccine: Marland Kitchen Difficulty breathing  . Swelling of face and throat  . A fast heartbeat  . A bad rash all over body  . Dizziness and weakness

## 2020-06-08 ENCOUNTER — Encounter: Payer: Self-pay | Admitting: *Deleted

## 2020-06-08 NOTE — Progress Notes (Signed)
Call to [facility] CT department to alert of elevated creatinine and MD request to administer lower dose of IV contrast Monday. Spoke w/Sierra--she will add note to his case.

## 2020-06-11 ENCOUNTER — Inpatient Hospital Stay: Payer: Medicare Other | Admitting: Oncology

## 2020-06-11 ENCOUNTER — Other Ambulatory Visit: Payer: Self-pay

## 2020-06-11 ENCOUNTER — Encounter (HOSPITAL_COMMUNITY): Payer: Self-pay

## 2020-06-11 ENCOUNTER — Ambulatory Visit (HOSPITAL_COMMUNITY)
Admission: RE | Admit: 2020-06-11 | Discharge: 2020-06-11 | Disposition: A | Discharge status: Home / Self Care | Payer: Medicare Other | Source: Ambulatory Visit | Attending: Nurse Practitioner | Admitting: Nurse Practitioner

## 2020-06-11 DIAGNOSIS — C184 Malignant neoplasm of transverse colon: Secondary | ICD-10-CM | POA: Diagnosis not present

## 2020-06-11 MED ORDER — IOHEXOL 300 MG/ML  SOLN
100.0000 mL | Freq: Once | INTRAMUSCULAR | Status: AC | PRN
  Administered 2020-06-11: 100 mL via INTRAVENOUS

## 2020-06-13 ENCOUNTER — Encounter: Payer: Self-pay | Admitting: *Deleted

## 2020-06-13 ENCOUNTER — Telehealth: Payer: Self-pay | Admitting: Oncology

## 2020-06-13 NOTE — Progress Notes (Signed)
MD reviewed CT scan today. Schedule for OV in ~ 2 weeks with him or NP. Scheduling message sent.

## 2020-06-13 NOTE — Telephone Encounter (Signed)
Scheduled appt per 11/17 sch msg - pt is aware of appt date and time

## 2020-06-16 ENCOUNTER — Other Ambulatory Visit: Payer: Self-pay | Admitting: Cardiology

## 2020-06-16 ENCOUNTER — Other Ambulatory Visit (HOSPITAL_COMMUNITY): Payer: Self-pay | Admitting: Internal Medicine

## 2020-06-27 ENCOUNTER — Encounter: Payer: Self-pay | Admitting: Nurse Practitioner

## 2020-06-27 ENCOUNTER — Other Ambulatory Visit: Payer: Self-pay

## 2020-06-27 ENCOUNTER — Inpatient Hospital Stay: Payer: Medicare Other | Attending: Nurse Practitioner | Admitting: Nurse Practitioner

## 2020-06-27 VITALS — HR 61 | Temp 97.3°F | Resp 16 | Ht 71.0 in | Wt 171.1 lb

## 2020-06-27 DIAGNOSIS — D509 Iron deficiency anemia, unspecified: Secondary | ICD-10-CM | POA: Insufficient documentation

## 2020-06-27 DIAGNOSIS — J45909 Unspecified asthma, uncomplicated: Secondary | ICD-10-CM | POA: Diagnosis not present

## 2020-06-27 DIAGNOSIS — G620 Drug-induced polyneuropathy: Secondary | ICD-10-CM | POA: Diagnosis not present

## 2020-06-27 DIAGNOSIS — I5022 Chronic systolic (congestive) heart failure: Secondary | ICD-10-CM | POA: Diagnosis not present

## 2020-06-27 DIAGNOSIS — Z79899 Other long term (current) drug therapy: Secondary | ICD-10-CM | POA: Insufficient documentation

## 2020-06-27 DIAGNOSIS — T451X5A Adverse effect of antineoplastic and immunosuppressive drugs, initial encounter: Secondary | ICD-10-CM | POA: Diagnosis not present

## 2020-06-27 DIAGNOSIS — C184 Malignant neoplasm of transverse colon: Secondary | ICD-10-CM | POA: Insufficient documentation

## 2020-06-27 DIAGNOSIS — R918 Other nonspecific abnormal finding of lung field: Secondary | ICD-10-CM | POA: Diagnosis not present

## 2020-06-27 DIAGNOSIS — C778 Secondary and unspecified malignant neoplasm of lymph nodes of multiple regions: Secondary | ICD-10-CM | POA: Diagnosis not present

## 2020-06-27 DIAGNOSIS — Z8616 Personal history of COVID-19: Secondary | ICD-10-CM | POA: Insufficient documentation

## 2020-06-27 DIAGNOSIS — N261 Atrophy of kidney (terminal): Secondary | ICD-10-CM | POA: Diagnosis not present

## 2020-06-27 NOTE — Progress Notes (Addendum)
[place] OFFICE PROGRESS NOTE   Diagnosis: Colon cancer  INTERVAL HISTORY:   Joe Garcia returns for follow-up.  He feels well.  Bowels moving regularly.  No bloody or black stools.  No pain with bowel movements.  He describes his appetite as "great".  He is no longer having neuropathy symptoms in the hands.  Neuropathy symptoms in the feet have improved.  No fever or cough.  No change in baseline mild dyspnea.  Objective:  Vital signs in last 24 hours:  Pulse 61, temperature (!) 97.3 F (36.3 C), temperature source Tympanic, resp. rate 16, height _0  (1.803 m), weight 171 lb 1.6 oz (77.6 kg), SpO2 100 %.    HEENT: Neck without mass. Lymphatics: No palpable cervical, supraclavicular, axillary or inguinal lymph nodes. Resp: Lungs clear bilaterally. Cardio: Regular rate and rhythm. GI: Abdomen soft and nontender.  No hepatomegaly. Vascular: No leg edema. Port-A-Cath without erythema.  Lab Results:  Lab Results  Component Value Date   WBC 5.7 06/07/2020   HGB 12.1 (L) 06/07/2020   HCT 37.1 (L) 06/07/2020   MCV 88.5 06/07/2020   PLT 205 06/07/2020   NEUTROABS 2.8 06/07/2020    Imaging:  No results found.  Medications: I have reviewed the patient's current medications.  Assessment/Plan: 1. 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 Grade 3, lymphovascular and perineural invasion present, 4/18 lymph nodes positive, positive lymph nodes mesenteric margin, tumor invades into adherent omentum, MSI high,loss of MLH1 and PMS2 expression; MLH1 methylation not detected; BRAF mutation analysis-negative ? CT abdomen/pelvis 06/26/2019-thickening within the distal transverse colon, severely atrophic and calcified right kidney ? CT chest 06/28/2019-no evidence of metastatic disease, stable 3 mm right lower lobe nodule ? Cycle 1 FOLFOX 08/04/2019 (oxaliplatin 65 mg/m due to renal function) ? Plan for  repeat CTs after he has completed 5 cycles of chemotherapy ? Cycle 2 FOLFOX 08/18/2019 ? Cycle 3 FOLFOX 08/31/2019, Udenyca added ? Cycle 4 FOLFOX 10/12/2019 ? Cycle 5 FOLFOX 10/25/2019 ? Stable right lower lobe nodule on chest CT 09/21/2019 ? CT abdomen/pelvis 11/07/2019-postoperative changes related to colectomy and anastomosis. No currentevidence of disease. No adenopathy. ? Cycle 6 FOLFOX 11/08/2019 ? Cycle 7 FOLFOX 11/23/2019 ? Cycle 8 FOLFOX 12/06/2019 ? Cycle 9 FOLFOX 12/20/2019 ? Case presented GI tumor conference 12/28/2018-tumor margin negative, positive lymph node at margin; no recommendation for further surgery or radiation; peripancreatic node on baseline CT not seen on April 2021 CT ? Cycle 10 FOLFOX 01/03/2020 ? Cycle 11 FOLFOX 01/17/2020-oxaliplatin and Udenyca held ? Cycle 12 FOLFOX 01/31/2020-oxaliplatin and Udenyca held, 5-FU dose reduced secondary to mucositis ? 05/25/2020 colonoscopy-internal hemorrhoids, repeat colonoscopy in 3 years for surveillance ? CTs 06/11/2020-new trace right pelvic fluid, nonspecific; new mild left upper lobe interstitial/groundglass opacity suspicious for minimal infection or inflammation; similar 2 mm right lower lobe pulmonary nodule 2. Asthma 3. 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 Microcytic anemia, likely iron deficiency anemia secondary to #1 7. Port-A-Cath placement on 12/15/2019, Dr. Rosendo Gros 8. 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 Hospitalized with sepsis/multifocal pneumonia/probable COPD exacerbation July 2021 11. Mammogram 04/04/2020-mild to moderate right gynecomastia  Disposition: Joe Garcia appears stable.  Recent surveillance CT scans show no evidence of metastatic disease.  He is up-to-date on surveillance colonoscopy.  He would like to keep the Port-A-Cath in place for now.  He will return for a port flush in 6 weeks.  He will return for  CEA and follow-up visit in 3 months.  He will contact the office in the interim with any problems.  Plan reviewed with Dr. [name].    [name] ANP/GNP-BC   06/27/2020  10:58 AM

## 2020-06-28 ENCOUNTER — Telehealth: Payer: Self-pay | Admitting: Nurse Practitioner

## 2020-06-28 NOTE — Telephone Encounter (Signed)
Scheduled appointments per 12/1 los. Mailed updated calendar to patient.

## 2020-07-05 ENCOUNTER — Other Ambulatory Visit: Payer: Self-pay | Admitting: Nurse Practitioner

## 2020-07-05 DIAGNOSIS — C184 Malignant neoplasm of transverse colon: Secondary | ICD-10-CM

## 2020-07-06 ENCOUNTER — Telehealth: Payer: Self-pay | Admitting: Oncology

## 2020-07-06 NOTE — Telephone Encounter (Signed)
Scheduled appt per 12/09 sch msg- called pt . Unable to leave message - no vmail set up . Mailed letter with appt date and time

## 2020-07-09 ENCOUNTER — Encounter (HOSPITAL_COMMUNITY): Payer: Self-pay

## 2020-07-09 ENCOUNTER — Ambulatory Visit (HOSPITAL_COMMUNITY)
Admission: EM | Admit: 2020-07-09 | Discharge: 2020-07-09 | Disposition: A | Discharge status: Home / Self Care | Payer: Medicare Other | Attending: Internal Medicine | Admitting: Internal Medicine

## 2020-07-09 ENCOUNTER — Ambulatory Visit (INDEPENDENT_AMBULATORY_CARE_PROVIDER_SITE_OTHER): Payer: Medicare Other

## 2020-07-09 ENCOUNTER — Other Ambulatory Visit: Payer: Self-pay

## 2020-07-09 DIAGNOSIS — J209 Acute bronchitis, unspecified: Secondary | ICD-10-CM

## 2020-07-09 DIAGNOSIS — R040 Epistaxis: Secondary | ICD-10-CM

## 2020-07-09 DIAGNOSIS — K5909 Other constipation: Secondary | ICD-10-CM | POA: Diagnosis not present

## 2020-07-09 DIAGNOSIS — C184 Malignant neoplasm of transverse colon: Secondary | ICD-10-CM

## 2020-07-09 DIAGNOSIS — R109 Unspecified abdominal pain: Secondary | ICD-10-CM | POA: Diagnosis not present

## 2020-07-09 DIAGNOSIS — J0100 Acute maxillary sinusitis, unspecified: Secondary | ICD-10-CM

## 2020-07-09 MED ORDER — CLARITHROMYCIN ER 500 MG PO TB24: 1000.0000 mg | ORAL_TABLET | Freq: Every day | ORAL | 0 refills | Status: DC

## 2020-07-09 MED ORDER — ALBUTEROL SULFATE HFA 108 (90 BASE) MCG/ACT IN AERS: 2.0000 | INHALATION_SPRAY | RESPIRATORY_TRACT | 0 refills | Status: AC | PRN

## 2020-07-09 MED ORDER — DOCUSATE SODIUM 100 MG PO CAPS: 100.0000 mg | ORAL_CAPSULE | Freq: Two times a day (BID) | ORAL | 0 refills | Status: DC

## 2020-07-09 NOTE — Discharge Instructions (Signed)
You still have a lot of stool in your colon, so I sent colace which is a stool softner for you to take daily. For the nose bleeds: Get a saline gel called Ayr and apply into your nose at bed time with a Q-tip Monitor your blood pressure and if it stays more then 140/90, call your family doctor. Dry heat and persistent blowing of your nose and high blood pressuer and causes of nose bleeds.

## 2020-07-09 NOTE — ED Triage Notes (Addendum)
Pt in with c/o epistaxis that has been happening since Saturday. States he has a bad head cold and every morning when he gets up his nose bleeds for about 10 minutes.  Pt has not had medication for sxs  States he has congestion and productive cough, and some sob

## 2020-07-09 NOTE — ED Provider Notes (Signed)
[place]    CSN: [id number] Arrival date & time: 07/09/20  1313      History   Chief Complaint Chief Complaint  Patient presents with   Epistaxis    HPI Joe Garcia is a 69 y.o. male who has had nose congestion and productive cough with yellow mucous x 1.5 weeks. Has been taking his BP med, but any sickness or stress makes his BP go up. The systolic normally runs in the 140's States that every morning in the past week, his nose starts bleeding when he gets up and is bending over and takes a bout 10 minutes to resolve. He did take his BP med this am.  Denies fever, chills, SOB, CP, HA, ringing in his ears, or ear pain.   2- has hx of chronic constipation and has had mild lower abdominal pain. Has a small BM this am    Past Medical History:  Diagnosis Date   Alcoholism ([place])    Allergy    Arthritis    hips, L shoulder   Asthma    Cancer ([place])    colon cancer   CHF (congestive heart failure) (HCC)    Chronic kidney disease    only has one kidney   COPD (chronic obstructive pulmonary disease) ([place])    COVID-19    Depression    pt denies   Dyspnea    Dysrhythmia    PVC's   Family history of anesthesia complication    GERD (gastroesophageal reflux disease)    Heart murmur    Hyperlipidemia    Hypertension    Myocardial infarction [facility])    pt is unsure of date- believes early 2020   Osteoporosis    Pneumonia     Patient Active Problem List   Diagnosis Date Noted   Acute-on-chronic kidney injury ([place]) 02/12/2020   Sepsis ([place]) 02/12/2020   Pneumonia 02/12/2020   Multifocal pneumonia 02/11/2020   Port-A-Cath in place 35/45/6256   Chronic systolic heart failure ([place])    Colonic obstruction ([place])    Cancer of transverse colon s/p partial colectomy 06/30/2019    Nausea & vomiting 06/26/2019   Colonic mass 06/26/2019   COPD GOLD ?  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   Solitary kidney, congenital 06/23/2017   Nonischemic cardiomyopathy ([place])    Multifocal PVCs    Chronic combined systolic (congestive) and diastolic (congestive) heart failure ([place]) 06/11/2017   Hypokalemia 05/05/2017   DOE (dyspnea on exertion) 07/16/2013   Alcohol abuse 09/25/2012   Alcohol dependence ([place]) 09/22/2012   Essential hypertension 03/22/2007   DEGENERATIVE [place] DISEASE 03/22/2007   AVASCULAR NECROSIS 03/22/2007    Past Surgical History:  Procedure Laterality Date   BIOPSY  06/27/2019   Procedure: BIOPSY;  Surgeon: Yetta Flock, MD;  Location: [facility];  Service: Gastroenterology;;   CATARACT EXTRACTION     COLONOSCOPY Left 06/27/2019   Procedure: COLONOSCOPY;  Surgeon: Yetta Flock, MD;  Location: [facility];  Service: Gastroenterology;  Laterality: Left;   COLONOSCOPY     LAPAROTOMY N/A 06/30/2019   Procedure: EXPLORATORY LAPAROTOMY;  Surgeon: Ralene Ok, MD;  Location: [facility];  Service: General;  Laterality: N/A;  LEFT HEART CATH AND CORONARY ANGIOGRAPHY N/A 10/04/2018   Procedure: LEFT HEART CATH AND CORONARY ANGIOGRAPHY;  Surgeon: Jettie Booze, MD;  Location: [facility];  Service: Cardiovascular;  Laterality: N/A;   PARTIAL COLECTOMY N/A 06/30/2019   Procedure: PARTIAL COLECTOMY;  Surgeon: Ralene Ok, MD;  Location: [facility];  Service: General;  Laterality: N/A;   PORTACATH PLACEMENT Left 08/02/2019   Procedure: INSERTION PORT-A-CATH LEFT CHEST;  Surgeon: Ralene Ok, MD;  Location: [facility];  Service: General;  Laterality: Left;   RIGHT/LEFT HEART CATH AND CORONARY ANGIOGRAPHY N/A 06/12/2017   Procedure: RIGHT/LEFT  HEART CATH AND CORONARY ANGIOGRAPHY;  Surgeon: Jolaine Artist, MD;  Location: [facility];  Service: Cardiovascular;  Laterality: N/A;       Home Medications    Prior to Admission medications   Medication Sig Start Date End Date Taking? Authorizing Provider  albuterol (VENTOLIN HFA) 108 (90 Base) MCG/ACT inhaler Inhale 2 puffs into the lungs every 4 (four) hours as needed for wheezing or shortness of breath. 07/09/20   Rodriguez-Southworth, Sunday Spillers, PA-C  amiodarone (PACERONE) 100 MG tablet TAKE ONE TABLET BY MOUTH DAILY. 06/19/20   Larey Dresser, MD  budesonide-formoterol [facility]) 160-4.5 MCG/ACT inhaler Inhale 2 puffs into the lungs 2 (two) times daily. 11/23/18   Tanda Rockers, MD  carvedilol (COREG) 3.125 MG tablet TAKE 1 TABLET BY MOUTH TWICE DAILY WITH A MEAL. 06/19/20   Bensimhon, Shaune Pascal, MD  Cholecalciferol (VITAMIN D3 PO) Take by mouth daily.    [provider]  clarithromycin (BIAXIN XL) 500 MG 24 hr tablet Take 2 tablets (1,000 mg total) by mouth daily. 07/09/20   Rodriguez-Southworth, Sunday Spillers, PA-C  docusate sodium (COLACE) 100 MG capsule Take 1 capsule (100 mg total) by mouth 2 (two) times daily. 07/09/20   Rodriguez-Southworth, Sunday Spillers, PA-C  fluticasone (FLONASE) 50 MCG/ACT nasal spray Place 2 sprays into both nostrils daily as needed for allergies or rhinitis. 02/13/20   Aline August, MD  gabapentin (NEURONTIN) 100 MG capsule Take 2 capsules (200 mg total) by mouth 3 (three) times daily. 02/23/20   Ladell Pier, MD  isosorbide-hydrALAZINE (BIDIL) 20-37.5 MG tablet Take 0.5 tablets by mouth 3 (three) times daily.     [provider]  montelukast (SINGULAIR) 10 MG tablet Take 10 mg by mouth at bedtime.    [provider]  spironolactone (ALDACTONE) 25 MG tablet Take 1 tablet (25 mg total) by mouth daily. NEEDS APPT FOR FURTHER REFILLS 02/07/20   Darrick Grinder D, NP  loratadine (CLARITIN) 10 MG tablet Take 10 mg by mouth daily. Patient  not taking: Reported on 01/31/2020  02/11/20  [provider]    Family History Family History  Problem Relation Age of Onset   Heart disease Father    Heart disease Daughter        "fluid around heart"    Canavan disease Daughter    Cancer Daughter        unsure of type   Asthma Son    Heart failure Mother    Kidney failure Mother    Colon cancer Neg Hx    Esophageal cancer Neg Hx    Rectal cancer Neg Hx    Stomach cancer Neg Hx     Social History Social History   Tobacco Use   Smoking status: Former Smoker    Packs/day: 0.10    Years: 45.00    Pack years: 4.50    Quit date: 2012  Years since quitting: 9.9   Smokeless tobacco: Never Used  Vaping Use   Vaping Use: Never used  Substance Use Topics   Alcohol use: No    Comment: Pt states no etoh since April 2014   Drug use: Not Currently    Comment: Prior use of crack cocaine, quit 2012     Allergies   Lisinopril   Review of Systems Review of Systems  Constitutional: Positive for fatigue. Negative for activity change, appetite change, chills, diaphoresis and fever.  HENT: Positive for congestion, nosebleeds, postnasal drip, rhinorrhea and sinus pressure. Negative for sore throat and trouble swallowing.   Eyes: Negative for discharge.  Respiratory: Positive for cough. Negative for shortness of breath and wheezing.   Cardiovascular: Negative for chest pain and leg swelling.  Gastrointestinal: Positive for abdominal pain and constipation. Negative for blood in stool, diarrhea, nausea and vomiting.  Genitourinary: Negative for difficulty urinating.  Musculoskeletal: Positive for myalgias. Negative for gait problem.  Skin: Negative for rash.  Hematological: Negative for adenopathy.     Physical Exam Triage Vital Signs ED Triage Vitals  Enc Vitals Group     BP 07/09/20 1506 (!) 180/84     Pulse Rate 07/09/20 1506 (!) 54     Resp 07/09/20 1506 20     Temp 07/09/20 1506 97.7 F (36.5  C)     Temp Source 07/09/20 1506 Oral     SpO2 07/09/20 1506 100 %     Weight --      Height --      Head Circumference --      Peak Flow --      Pain Score 07/09/20 1503 0     Pain Loc --      Pain Edu? --      Excl. in [place]? --    No data found.  Updated Vital Signs BP (!) 180/84 (BP Location: Right Arm)    Pulse (!) 54    Temp 97.7 F (36.5 C) (Oral)    Resp 20    SpO2 100%   Visual Acuity Right Eye Distance:   Left Eye Distance:   Bilateral Distance:    Right Eye Near:   Left Eye Near:    Bilateral Near:     Physical Exam  Physical Exam Vitals signs and nursing note reviewed.  Constitutional:      General: he is not in acute distress.    Appearance: Normal appearance. he is not ill-appearing, toxic-appearing or diaphoretic.  HENT:     Head: Normocephalic.     Right Ear: Tympanic membrane, ear canal and external ear normal.     Left Ear: Tympanic membrane, ear canal and external ear normal.     Nose: with moderate congestion. No signs of blood noted. All his sinuses are tender to palpation.     Mouth/Throat: clear    Mouth: Mucous membranes are moist.  Eyes:     General: No scleral icterus.       Right eye: No discharge.        Left eye: No discharge.     Conjunctiva/sclera: Conjunctivae normal.  Neck:     Musculoskeletal: Neck supple. No neck rigidity.  Cardiovascular:     Rate and Rhythm: Normal rate and regular rhythm.     Heart sounds: No murmur.  Pulmonary:     Effort: Pulmonary effort is normal.     Breath sounds: Normal breath sounds.  Abdominal:     General: Bowel sounds are  normal. There is no distension.     Palpations: Abdomen is soft. There is no mass.     Tenderness: has moderate tenderness on RLQ and mild on LLQ. There is no guarding or rebound.     Hernia: No hernia is present.  Musculoskeletal: Normal range of motion.  Lymphadenopathy:     Cervical: No cervical adenopathy.  Skin:    General: Skin is warm and dry.     Coloration: Skin  is not jaundiced.     Findings: No rash.  Neurological:     Mental Status: he is alert and oriented to person, place, and time.     Gait: Gait normal.  Psychiatric:        Mood and Affect: Mood normal.        Behavior: Behavior normal.        Thought Content: Thought content normal.        Judgment: Judgment normal.    UC Treatments / Results  Labs (all labs ordered are listed, but only abnormal results are displayed) Labs Reviewed - No data to display  EKG   Radiology DG Abd 2 Views  Result Date: 07/09/2020 CLINICAL DATA:  Epitaxis.  Abdominal pain. EXAM: ABDOMEN - 2 VIEW COMPARISON:  June 18, 2017. FINDINGS: The bowel gas pattern is normal. There is no evidence of free air. Stable calcifications seen in right upper quadrant consistent with calcified atrophic right kidney. IMPRESSION: No evidence of bowel obstruction or ileus. Electronically Signed   By: Marijo Conception M.D.   On: 07/09/2020 15:30    Procedures Procedures (including critical care time)  Medications Ordered in UC Medications - No data to display  Initial Impression / Assessment and Plan / UC Course  I have reviewed the triage vital signs and the nursing notes. Has chronic constipation and I placed him on colace as noted. Has bronchitis and sinusitis and I placed him on Biaxen XL as noted.  Pertinent labs imaging results that were available during my care of the patient were reviewed by me and considered in my medical decision making (see chart for details). See instructions.  Final Clinical Impressions(s) / UC Diagnoses   Final diagnoses:  Epistaxis  Acute non-recurrent maxillary sinusitis  Chronic constipation  Acute bronchitis, unspecified organism     Discharge Instructions     You still have a lot of stool in your colon, so I sent colace which is a stool softner for you to take daily. For the nose bleeds: Get a saline gel called Ayr and apply into your nose at bed time with a Q-tip Monitor  your blood pressure and if it stays more then 140/90, call your family doctor. Dry heat and persistent blowing of your nose and high blood pressuer and causes of nose bleeds.     ED Prescriptions    Medication Sig Dispense Auth. Provider   albuterol (VENTOLIN HFA) 108 (90 Base) MCG/ACT inhaler Inhale 2 puffs into the lungs every 4 (four) hours as needed for wheezing or shortness of breath. 18 g Rodriguez-Southworth, Sunday Spillers, PA-C   clarithromycin (BIAXIN XL) 500 MG 24 hr tablet Take 2 tablets (1,000 mg total) by mouth daily. 7 tablet Rodriguez-Southworth, Mikayla Chiusano, PA-C   docusate sodium (COLACE) 100 MG capsule Take 1 capsule (100 mg total) by mouth 2 (two) times daily. 60 capsule Rodriguez-Southworth, Sunday Spillers, PA-C     PDMP not reviewed this encounter.   Shelby Mattocks, PA-C 07/09/20 1700

## 2020-07-26 ENCOUNTER — Other Ambulatory Visit: Payer: Medicare Other

## 2020-07-26 ENCOUNTER — Other Ambulatory Visit: Payer: Self-pay

## 2020-07-26 DIAGNOSIS — Z20822 Contact with and (suspected) exposure to covid-19: Secondary | ICD-10-CM

## 2020-07-27 LAB — SARS-COV-2, NAA 2 DAY TAT

## 2020-07-27 LAB — NOVEL CORONAVIRUS, NAA: SARS-CoV-2, NAA: NOT DETECTED

## 2020-08-01 ENCOUNTER — Telehealth: Payer: Self-pay | Admitting: General Practice

## 2020-08-01 NOTE — Telephone Encounter (Signed)
Patient called to get COVID results. Made him aware they were negative. 

## 2020-08-06 ENCOUNTER — Other Ambulatory Visit: Payer: Medicare Other

## 2020-08-06 ENCOUNTER — Encounter (HOSPITAL_COMMUNITY): Payer: Medicare Other

## 2020-08-08 ENCOUNTER — Telehealth: Payer: Self-pay | Admitting: *Deleted

## 2020-08-08 ENCOUNTER — Inpatient Hospital Stay: Payer: Medicare Other

## 2020-08-08 ENCOUNTER — Inpatient Hospital Stay (HOSPITAL_BASED_OUTPATIENT_CLINIC_OR_DEPARTMENT_OTHER): Payer: Medicare Other | Admitting: Genetic Counselor

## 2020-08-08 ENCOUNTER — Other Ambulatory Visit: Payer: Self-pay | Admitting: Genetic Counselor

## 2020-08-08 ENCOUNTER — Other Ambulatory Visit: Payer: Self-pay

## 2020-08-08 ENCOUNTER — Inpatient Hospital Stay: Payer: Medicare Other | Attending: Nurse Practitioner

## 2020-08-08 DIAGNOSIS — C184 Malignant neoplasm of transverse colon: Secondary | ICD-10-CM

## 2020-08-08 DIAGNOSIS — Z79899 Other long term (current) drug therapy: Secondary | ICD-10-CM | POA: Insufficient documentation

## 2020-08-08 DIAGNOSIS — T451X5A Adverse effect of antineoplastic and immunosuppressive drugs, initial encounter: Secondary | ICD-10-CM | POA: Insufficient documentation

## 2020-08-08 DIAGNOSIS — G620 Drug-induced polyneuropathy: Secondary | ICD-10-CM | POA: Insufficient documentation

## 2020-08-08 DIAGNOSIS — J45909 Unspecified asthma, uncomplicated: Secondary | ICD-10-CM | POA: Diagnosis not present

## 2020-08-08 DIAGNOSIS — N261 Atrophy of kidney (terminal): Secondary | ICD-10-CM | POA: Diagnosis not present

## 2020-08-08 DIAGNOSIS — D649 Anemia, unspecified: Secondary | ICD-10-CM | POA: Insufficient documentation

## 2020-08-08 DIAGNOSIS — Z809 Family history of malignant neoplasm, unspecified: Secondary | ICD-10-CM | POA: Diagnosis not present

## 2020-08-08 DIAGNOSIS — Z87891 Personal history of nicotine dependence: Secondary | ICD-10-CM | POA: Diagnosis not present

## 2020-08-08 DIAGNOSIS — Z8616 Personal history of COVID-19: Secondary | ICD-10-CM | POA: Insufficient documentation

## 2020-08-08 DIAGNOSIS — Z95828 Presence of other vascular implants and grafts: Secondary | ICD-10-CM

## 2020-08-08 DIAGNOSIS — I5022 Chronic systolic (congestive) heart failure: Secondary | ICD-10-CM | POA: Diagnosis not present

## 2020-08-08 DIAGNOSIS — Z452 Encounter for adjustment and management of vascular access device: Secondary | ICD-10-CM | POA: Diagnosis not present

## 2020-08-08 LAB — GENETIC SCREENING ORDER

## 2020-08-08 MED ORDER — HEPARIN SOD (PORK) LOCK FLUSH 100 UNIT/ML IV SOLN
500.0000 [IU] | Freq: Once | INTRAVENOUS | Status: AC
  Administered 2020-08-08: 500 [IU]
  Filled 2020-08-08: qty 5

## 2020-08-08 MED ORDER — SODIUM CHLORIDE 0.9% FLUSH
10.0000 mL | Freq: Once | INTRAVENOUS | Status: AC
  Administered 2020-08-08: 10 mL
  Filled 2020-08-08: qty 10

## 2020-08-08 NOTE — Telephone Encounter (Signed)
Called patient at the request of genetics counselor. Patient reports 1 month H/O mid to low abdominal pain after eating. Having some nausea and excess belching. Getting constipated again and needs to take ExLax and prune juice for bowels to move. Denies any blood in stool. He is asking if he could be seen soon? Will review w/MD and call him tomorrow.

## 2020-08-08 NOTE — Progress Notes (Signed)
REFERRING PROVIDER: Medicine, [facility] [place],  [place] [address]  PRIMARY PROVIDER:  Medicine, [facility]  PRIMARY REASON FOR VISIT:  1. 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, a 70 y.o. male, was seen for a [place] cancer genetics consultation at the request of Dr. [name] due to a personal history of colon cancer.  [name] presents to clinic today to discuss the possibility of a hereditary predisposition to cancer, genetic testing, and to further clarify his future cancer risks, as well as potential cancer risks for family members.   In November 2021, at the age of 77, [name] was diagnosed with cancer of the transverse colon. The treatment plan included surgery and chemotherapy. MSI is high, IHC showed loss of MLH1 and PMS2.  Additionally, there is no methylation and BRAF was negative.    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   07/21/2019 Cancer Staging   Staging form: Colon and Rectum, AJCC 8th Edition - Clinical: Stage IIIC (cT4b, cN2, cM0) - Signed by Ladell Pier, MD on 07/21/2019   08/04/2019 -  Chemotherapy   The patient had palonosetron (ALOXI) injection 0.25 mg, 0.25 mg, Intravenous,  Once, 10 of 10 cycles Administration: 0.25 mg (08/04/2019), 0.25 mg (08/18/2019), 0.25 mg (08/31/2019), 0.25 mg (10/12/2019), 0.25 mg (10/25/2019), 0.25 mg (11/08/2019), 0.25 mg (11/23/2019), 0.25 mg (12/06/2019), 0.25 mg (12/20/2019), 0.25 mg (01/03/2020) pegfilgrastim-cbqv (UDENYCA) injection 6 mg, 6 mg, Subcutaneous, Once, 8 of 8 cycles Administration: 6 mg (09/02/2019), 6 mg (10/14/2019), 6 mg (10/27/2019), 6 mg (11/10/2019), 6 mg (11/25/2019), 6 mg (12/08/2019), 6 mg (12/22/2019), 6 mg (01/05/2020) leucovorin 732 mg in dextrose 5 % 250 mL  infusion, 400 mg/m2 = 732 mg, Intravenous,  Once, 12 of 12 cycles Dose modification: 300 mg/m2 (original dose 400 mg/m2, Cycle 12, Reason: Provider Judgment) Administration: 732 mg (08/04/2019), 732 mg (08/18/2019), 732 mg (08/31/2019), 732 mg (10/12/2019), 732 mg (10/25/2019), 732 mg (11/08/2019), 732 mg (11/23/2019), 732 mg (12/06/2019), 732 mg (12/20/2019), 732 mg (01/03/2020), 732 mg (01/17/2020), 550 mg (01/31/2020) oxaliplatin (ELOXATIN) 120 mg in dextrose 5 % 500 mL chemo infusion, 65 mg/m2 = 120 mg (76.5 % of original dose 85 mg/m2), Intravenous,  Once, 10 of 10 cycles Dose modification: 65 mg/m2 (original dose 85 mg/m2, Cycle 1, Reason: Change in SCr/CrCl) Administration: 120 mg (08/04/2019), 120 mg (08/18/2019), 120 mg (08/31/2019), 120 mg (10/12/2019), 120 mg (10/25/2019), 120 mg (11/08/2019), 120 mg (11/23/2019), 120 mg (12/06/2019), 120 mg (12/20/2019), 120 mg (01/03/2020) fluorouracil (ADRUCIL) chemo injection 750 mg, 400 mg/m2 = 750 mg, Intravenous,  Once, 12 of 12 cycles Dose modification: 300 mg/m2 (original dose 400 mg/m2, Cycle 12, Reason: Provider Judgment) Administration: 750 mg (08/04/2019), 750 mg (08/18/2019), 750 mg (08/31/2019), 750 mg (10/12/2019), 750 mg (10/25/2019), 750 mg (11/08/2019), 750 mg (11/23/2019), 750 mg (12/06/2019), 750 mg (12/20/2019), 750 mg (01/03/2020), 750 mg (01/17/2020), 550 mg (01/31/2020) fluorouracil (ADRUCIL) 4,400 mg in sodium chloride 0.9 % 62 mL chemo infusion, 2,400 mg/m2 = 4,400 mg, Intravenous, 1 Day/Dose, 12 of 12 cycles Dose modification: 1,800 mg/m2 (original dose 2,400 mg/m2, Cycle 12, Reason: Provider Judgment) Administration: 4,400 mg (08/04/2019), 4,400 mg (08/18/2019), 4,400 mg (08/31/2019), 4,400 mg (10/12/2019), 4,400 mg (10/25/2019), 4,400 mg (11/08/2019), 4,400 mg (11/23/2019), 4,400 mg (12/06/2019), 4,400 mg (  12/20/2019), 4,400 mg (01/03/2020), 4,400 mg (01/17/2020), 3,300 mg (01/31/2020)  for chemotherapy treatment.       Past Medical History:  Diagnosis Date  . Alcoholism ([place])   .  Allergy   . Arthritis    hips, L shoulder  . Asthma   . Cancer [facility])    colon cancer  . CHF (congestive heart failure) ([place])   . Chronic kidney disease    only has one kidney  . COPD (chronic obstructive pulmonary disease) ([place])   . COVID-19   . Depression    pt denies  . Dyspnea   . Dysrhythmia    PVC's  . Family history of anesthesia complication   . GERD (gastroesophageal reflux disease)   . Heart murmur   . Hyperlipidemia   . Hypertension   . Myocardial infarction [facility])    pt is unsure of date- believes early 2020  . Osteoporosis   . Pneumonia     Past Surgical History:  Procedure Laterality Date  . BIOPSY  06/27/2019   Procedure: BIOPSY;  Surgeon: Yetta Flock, MD;  Location: [facility] ENDOSCOPY;  Service: Gastroenterology;;  . CATARACT EXTRACTION    . COLONOSCOPY Left 06/27/2019   Procedure: COLONOSCOPY;  Surgeon: Yetta Flock, MD;  [facility];  Service: Gastroenterology;  Laterality: Left;  . COLONOSCOPY    . LAPAROTOMY N/A 06/30/2019   Procedure: EXPLORATORY LAPAROTOMY;  Surgeon: Ralene Ok, MD;  Location: [place];  Service: General;  Laterality: N/A;  . LEFT HEART CATH AND CORONARY ANGIOGRAPHY N/A 10/04/2018   Procedure: LEFT HEART CATH AND CORONARY ANGIOGRAPHY;  Surgeon: Jettie Booze, MD;  Location: [place] CV LAB;  Service: Cardiovascular;  Laterality: N/A;  . PARTIAL COLECTOMY N/A 06/30/2019   Procedure: PARTIAL COLECTOMY;  Surgeon: Ralene Ok, MD;  Location: [place];  Service: General;  Laterality: N/A;  . PORTACATH PLACEMENT Left 08/02/2019   Procedure: INSERTION PORT-A-CATH LEFT CHEST;  Surgeon: Ralene Ok, MD;  Location: [place];  Service: General;  Laterality: Left;  . RIGHT/LEFT HEART CATH AND CORONARY ANGIOGRAPHY N/A 06/12/2017   Procedure: RIGHT/LEFT HEART CATH AND CORONARY ANGIOGRAPHY;  Surgeon: Jolaine Artist, MD;  Location: [place] CV LAB;  Service: Cardiovascular;  Laterality: N/A;    Social History    Socioeconomic History  . Marital status: Single    Spouse name: Not on file  . Number of children: Not on file  . Years of education: Not on file  . Highest education level: Not on file  Occupational History  . Occupation: "it don't work for me", on disability  Tobacco Use  . Smoking status: Former Smoker    Packs/day: 0.10    Years: 45.00    Pack years: 4.50    Quit date: 2012    Years since quitting: 10.0  . Smokeless tobacco: Never Used  Vaping Use  . Vaping Use: Never used  Substance and Sexual Activity  . Alcohol use: No    Comment: Pt states no etoh since April 2014  . Drug use: Not Currently    Comment: Prior use of crack cocaine, quit 2012  . Sexual activity: Never  Other Topics Concern  . Not on file  Social History Narrative  . Not on file   Social Determinants of Health   Financial Resource Strain: Not on file  Food Insecurity: Not on file  Transportation Needs: Not on file  Physical Activity: Not on file  Stress: Not on file  Social Connections: Not on file  FAMILY HISTORY:  We obtained a detailed, 4-generation family history.  Significant diagnoses are listed below: Family History  Problem Relation Age of Onset  . Heart disease Father   . Heart disease Daughter        "fluid around heart"   . Canavan disease Daughter   . Cancer Daughter        unsure of type  . Asthma Son   . Heart failure Mother   . Kidney failure Mother   . Colon cancer Neg Hx   . Esophageal cancer Neg Hx   . Rectal cancer Neg Hx   . Stomach cancer Neg Hx     The patient has eight children, one was diagnosed with cancer in her calf muscle at age 20 and died at 39.  He has one brother and six sisters who are all cancer free.  Both parents are deceased.  The patient's father died in his 41's from congestive heart failure.  He does not know the paternal side of the family.  The patient's mother died of a heart attack and kidney failure at 23.  She had multiple siblings  who were reportedly cancer free.  The maternal grandparents died of old age.  Joe Garcia is unaware of previous family history of genetic testing for hereditary cancer risks. Patient's maternal ancestors are of African American descent, and paternal ancestors are of African American descent. There is no reported Ashkenazi Jewish ancestry. There is no known consanguinity.  GENETIC COUNSELING ASSESSMENT: [name] is a 70 y.o. male with a personal and family history of cancer which is somewhat suggestive of a hereditary cancer syndrome and predisposition to cancer given the tumor testing that was performed. We, therefore, discussed and recommended the following at today's visit.   DISCUSSION: We discussed that 5 - 7% of colon cancer is hereditary, with most cases associated with Lynch syndrome.  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  There are other genes that can be associated with hereditary colon cancer syndromes.  These include POLD1, POLE, APC and MUTYH.  We discussed that testing is beneficial for several reasons including knowing how to follow individuals after completing their treatment, and understand if other family members could be at risk for cancer and allow them to undergo genetic testing.   We reviewed the characteristics, features and inheritance patterns of hereditary cancer syndromes. We also discussed genetic testing, including the appropriate family members to test, the process of testing, insurance coverage and turn-around-time for results. We discussed the implications of a negative, positive, carrier and/or variant of uncertain significant result. We recommended [name] pursue genetic testing for the CancerNext-Expanded+RNAinsight gene panel. The CancerNext-Expanded gene panel offered by [facility] and includes sequencing and  rearrangement analysis for the following 77 genes: AIP, ALK, APC*, ATM*, AXIN2, BAP1, BARD1, BLM, BMPR1A, BRCA1*, BRCA2*, BRIP1*, CDC73, CDH1*, CDK4, CDKN1B, CDKN2A, CHEK2*, CTNNA1, DICER1, FANCC, FH, FLCN, GALNT12, KIF1B, LZTR1, MAX, MEN1, MET, MLH1*, MSH2*, MSH3, MSH6*, MUTYH*, NBN, NF1*, NF2, NTHL1, PALB2*, PHOX2B, PMS2*, POT1, PRKAR1A, PTCH1, PTEN*, RAD51C*, RAD51D*, RB1, RECQL, RET, SDHA, SDHAF2, SDHB, SDHC, SDHD, SMAD4, SMARCA4, SMARCB1, SMARCE1, STK11, SUFU, TMEM127, TP53*, TSC1, TSC2, VHL and XRCC2 (sequencing and deletion/duplication); EGFR, EGLN1, HOXB13, KIT, MITF, PDGFRA, POLD1, and POLE (sequencing only); EPCAM and GREM1 (deletion/duplication only). DNA and RNA analyses performed for * genes.   Based on [name]  personal and family history of cancer, he meets medical criteria for genetic testing. Despite that he meets criteria, he may still have an out of pocket cost. We discussed that if his out of pocket cost for testing is over $100, the laboratory will call and confirm whether he wants to proceed with testing.  If the out of pocket cost of testing is less than $100 he will be billed by the genetic testing laboratory.   PLAN: After considering the risks, benefits, and limitations, [name] provided informed consent to pursue genetic testing and the blood sample was sent to [facility] for analysis of the CancerNext-Expanded+RNAinsight. Results should be available within approximately 2-3 weeks' time, at which point they will be disclosed by telephone to [name], as will any additional recommendations warranted by these results. [name] will receive a summary of his genetic counseling visit and a copy of his results once available. This information will also be available in Epic.   Lastly, we encouraged [name] to remain in contact with cancer genetics annually so that we can continuously update the family history and inform him of any changes in cancer genetics  and testing that may be of benefit for this family.   [name] questions were answered to his satisfaction today. Our contact information was provided should additional questions or concerns arise. Thank you for the referral and allowing [place] to share in the care of your patient.   Pariss Hommes P. Florene [place], [place], [facility] Licensed, Insurance risk surveyor [email or web address] phone: [phone]  The patient was seen for a total of 35 minutes in face-to-face genetic counseling.  This patient was discussed with Drs. Magrinat, Lindi Adie and/or Burr Medico who agrees with the above.    _______________________________________________________________________ For Office Staff:  Number of people involved in session: 1 Was an Intern/ student involved with case: no

## 2020-08-08 NOTE — Patient Instructions (Signed)

## 2020-08-09 ENCOUNTER — Telehealth: Payer: Self-pay

## 2020-08-09 NOTE — Telephone Encounter (Signed)
Pt contacted in reference to message about abdominal pain , nausea and constipation. Offered several appointment times. Pt to come in 08/10/20 at 02:45 pm.

## 2020-08-10 ENCOUNTER — Inpatient Hospital Stay: Payer: Medicare Other | Admitting: Nurse Practitioner

## 2020-08-10 ENCOUNTER — Telehealth: Payer: Self-pay

## 2020-08-10 NOTE — Telephone Encounter (Signed)
Pt called about appointment cancellation for today. Pt states he has a head cold, sore throat and abd pain. Per Dr [name] pt needs to get tested for COVID , pt stated he had phone number of [facility] testing site. Will follow up on pt's results

## 2020-08-10 NOTE — Telephone Encounter (Signed)
Call attempt to assess reason for not being able to come in today. He needs to be evaluated in person to assess if issue of abd pain.

## 2020-08-13 ENCOUNTER — Other Ambulatory Visit (HOSPITAL_COMMUNITY): Payer: Self-pay | Admitting: Adult Health

## 2020-08-13 ENCOUNTER — Other Ambulatory Visit (HOSPITAL_COMMUNITY): Payer: Self-pay | Admitting: Internal Medicine

## 2020-08-28 ENCOUNTER — Other Ambulatory Visit (HOSPITAL_COMMUNITY): Payer: Self-pay | Admitting: *Deleted

## 2020-08-28 ENCOUNTER — Telehealth (HOSPITAL_COMMUNITY): Payer: Self-pay | Admitting: Pharmacy Technician

## 2020-08-28 MED ORDER — AMIODARONE HCL 200 MG PO TABS: 100.0000 mg | ORAL_TABLET | Freq: Every day | ORAL | 3 refills | Status: DC

## 2020-08-28 NOTE — Telephone Encounter (Signed)
Received a PA request from the patient's insurance for Amiodarone 100mg , (1 tablet daily).  Insurance covers the 200mg  tablet, the patient would take one-half of a tablet (100mg ) daily.  Asked CMA to send the new RX to the patient's pharmacy. Called and updated patient regarding medication strength change.  Charlann Boxer, CPhT

## 2020-08-29 ENCOUNTER — Other Ambulatory Visit (HOSPITAL_COMMUNITY): Payer: Self-pay | Admitting: *Deleted

## 2020-08-29 MED ORDER — AMIODARONE HCL 100 MG PO TABS: 100.0000 mg | ORAL_TABLET | Freq: Every day | ORAL | 3 refills | Status: DC

## 2020-09-07 ENCOUNTER — Encounter: Payer: Self-pay | Admitting: Genetic Counselor

## 2020-09-07 DIAGNOSIS — Z1379 Encounter for other screening for genetic and chromosomal anomalies: Secondary | ICD-10-CM | POA: Insufficient documentation

## 2020-09-09 NOTE — Progress Notes (Incomplete)
Advanced Heart Failure Clinic Note   Date:  09/10/2020   ID:  Joe Garcia, DOB Oct 07, 1950, MRN [id number]  Location: Home  Provider location: [place] Advanced Heart Failure Type of Visit: Established patient   PCP:  Medicine, [facility]  Cardiologist:  No primary care provider on file. Primary HF: Dr [name].   Chief Complaint: Heart Failure.    History of Present Illness: Joe Garcia is a 70 y.o. 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 Former crack cocaine abuse.   Admitted 11/18 with CP and DOE. Echo with EF 30-35%.R/LHC with normal cors and compensated hemodynamics. 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    Readmitted 3/20 with chest pain. Cardiac enzymes minimally elevated but with EKG changes he was set up for LHC. LHC No CAD; On cath there was takatsubo ECHO completed and showed EF 30-35% and normal RV function. .  Found to have colon CA and underwent partial colectomy 06/30/2019   Echo 12/20 EF 60-65%   Here for routine f/u. We have not seen him since 9/20. He was referred back by his PCP to further evaluate his ab swelling and gas. Says his belly swells up in the morning and has to eat small meals. Says he is walking a lot. No CP. Does get SOB with activity. No edema, orthopnea or PND.     ECHO 10/04/2018 EF 30-35%.  The cavity size was mildly dilated. There is no increase in left ventricular wall thickness. Left ventricular diastolic Doppler  parameters are consistent with impaired relaxation. Right Ventricle: The right ventricle has normal systolic function. The cavity was normal. There is no increase in right ventricular wall thickness. Left Atrium: left atrial size was mildly dilated Right Atrium: right atrial size was normal in size. Right atrial pressure is estimated at 3 mmHg. Interatrial Septum: No atrial level shunt detected by color flow  Doppler. Pericardium: There is no evidence of pericardial effusion. Mitral Valve: The mitral valve is normal in structure. Mitral valve regurgitation is mild by color flow Doppler. Tricuspid Valve: The tricuspid valve is normal in structure. Tricuspid valve regurgitation is trivial by color flow Doppler. Aortic Valve: The aortic valve is tricuspid Mild calcification of the aortic valve. Aortic valve regurgitation is trivial by color flow Doppler. There is no stenosis of the aortic valve. Pulmonic Valve: The pulmonic valve was normal in structure. Pulmonic valve regurgitation is trivial by color flow Doppler. Aorta: The aortic root and ascending aorta are normal in size and structure. Venous: The inferior vena cava is normal in size with greater than 50% respiratory variability.     Past Medical History:  Diagnosis Date  . Alcoholism ([place])   . Allergy   . Arthritis    hips, L shoulder  . Asthma   . Cancer [facility])    colon cancer  . CHF (congestive heart failure) ([place])   . Chronic kidney disease    only has one kidney  . COPD (chronic obstructive pulmonary disease) ([place])   . COVID-19   . Depression    pt denies  . Dyspnea   . Dysrhythmia    PVC's  . Family history of anesthesia complication   . GERD (gastroesophageal reflux disease)   . Heart murmur   . Hyperlipidemia   . Hypertension   . Myocardial infarction [facility])    pt is unsure of date- believes early 2020  .  Osteoporosis   . Pneumonia    Past Surgical History:  Procedure Laterality Date  . BIOPSY  06/27/2019   Procedure: BIOPSY;  Surgeon: Yetta Flock, MD;  Location: [facility] ENDOSCOPY;  Service: Gastroenterology;;  . CATARACT EXTRACTION    . COLONOSCOPY Left 06/27/2019   Procedure: COLONOSCOPY;  Surgeon: Yetta Flock, MD;  Location: [facility] ENDOSCOPY;  Service: Gastroenterology;  Laterality: Left;  . COLONOSCOPY    . LAPAROTOMY N/A 06/30/2019   Procedure: EXPLORATORY LAPAROTOMY;  Surgeon: Ralene Ok, MD;   Location: [place];  Service: General;  Laterality: N/A;  . LEFT HEART CATH AND CORONARY ANGIOGRAPHY N/A 10/04/2018   Procedure: LEFT HEART CATH AND CORONARY ANGIOGRAPHY;  Surgeon: Jettie Booze, MD;  Location: [place] CV LAB;  Service: Cardiovascular;  Laterality: N/A;  . PARTIAL COLECTOMY N/A 06/30/2019   Procedure: PARTIAL COLECTOMY;  Surgeon: Ralene Ok, MD;  Location: [place];  Service: General;  Laterality: N/A;  . PORTACATH PLACEMENT Left 08/02/2019   Procedure: INSERTION PORT-A-CATH LEFT CHEST;  Surgeon: Ralene Ok, MD;  Location: [place];  Service: General;  Laterality: Left;  . RIGHT/LEFT HEART CATH AND CORONARY ANGIOGRAPHY N/A 06/12/2017   Procedure: RIGHT/LEFT HEART CATH AND CORONARY ANGIOGRAPHY;  Surgeon: Jolaine Artist, MD;  Location: [place] CV LAB;  Service: Cardiovascular;  Laterality: N/A;     Current Outpatient Medications  Medication Sig Dispense Refill  . albuterol (VENTOLIN HFA) 108 (90 Base) MCG/ACT inhaler Inhale 2 puffs into the lungs every 4 (four) hours as needed for wheezing or shortness of breath. 18 g 0  . amiodarone (PACERONE) 100 MG tablet Take 1 tablet (100 mg total) by mouth daily. 30 tablet 3  . budesonide-formoterol (SYMBICORT) 160-4.5 MCG/ACT inhaler Inhale 2 puffs into the lungs 2 (two) times daily. 1 Inhaler 11  . carvedilol (COREG) 3.125 MG tablet TAKE 1 TABLET BY MOUTH TWICE DAILY WITH A MEAL. 60 tablet 5  . clarithromycin (BIAXIN XL) 500 MG 24 hr tablet Take 2 tablets (1,000 mg total) by mouth daily. 7 tablet 0  . docusate sodium (COLACE) 100 MG capsule Take 1 capsule (100 mg total) by mouth 2 (two) times daily. 60 capsule 0  . famotidine (PEPCID) 20 MG tablet Take 40 mg by mouth 2 (two) times daily.    . fluticasone (FLONASE) 50 MCG/ACT nasal spray Place 2 sprays into both nostrils daily as needed for allergies or rhinitis.    Marland Kitchen isosorbide-hydrALAZINE (BIDIL) 20-37.5 MG tablet Take 0.5 tablets by mouth 3 (three) times daily.     .  montelukast (SINGULAIR) 10 MG tablet Take 10 mg by mouth at bedtime.    Marland Kitchen omeprazole (PRILOSEC) 40 MG capsule Take 40 mg by mouth daily. For 8 weeks     No current facility-administered medications for this encounter.    Allergies:   Lisinopril   Social History:  The patient  reports that he quit smoking about 10 years ago. He has a 4.50 pack-year smoking history. He has never used smokeless tobacco. He reports previous drug use. He reports that he does not drink alcohol.   Family History:  The patient's family history includes Asthma in his son; Canavan disease in his daughter; Cancer in his daughter; Heart disease in his daughter and father; Heart failure in his mother; Kidney failure in his mother.   Vitals:   09/10/20 1012  BP: (!) 158/88  Pulse: 66  SpO2: 98%   General:  Well appearing. No resp difficulty HEENT: normal Neck: supple. no JVD.  Carotids 2+ bilat; no bruits. No lymphadenopathy or thryomegaly appreciated. Cor: PMI nondisplaced. Regular rate & rhythm. No rubs, gallops or murmurs. Lungs: clear Abdomen: soft, nontender, nondistended.  Midline scar. No hepatosplenomegaly. No bruits or masses. Good bowel sounds. Extremities: no cyanosis, clubbing, rash, edema Neuro: alert & orientedx3, cranial nerves grossly intact. moves all 4 extremities w/o difficulty. Affect pleasant  NSR 70 nonspecific ST sagging. 1 PVC. Personally reviewed   Recent Labs: 02/13/2020: Magnesium 1.9 06/07/2020: ALT 20; BUN 24; Creatinine 1.49; Hemoglobin 12.1; Platelet Count 205; Potassium 4.0; Sodium 140  Personally reviewed   Wt Readings from Last 3 Encounters:  09/10/20 74.6 kg (164 lb 6.4 oz)  06/27/20 77.6 kg (171 lb 1.6 oz)  05/25/20 75.3 kg (166 lb)      ASSESSMENT AND PLAN:  1. Chronic Systolic Heart Failure with recovered EF - NICM. 09/2018 LHC no CAD. ECHO 09/2018 EF 30-35%. - Echo 12/20 EF 60-65%   - Stable NYHA II-III. Volume status stable. Not on lasix any more - Now off  spiro. Unclear why.  - Continue bidil 1/2 tab twice a day. Unable to tolerate higher dose due to HAs and dizziness - Continue carvedilol 3.125 mg twice a day. - Increase losartan 50 daily. - Not on entresto due to angioedema noted on lisinopril.  - Repeat echo - Can graduate from [facility]. F/u [facility]   2. COPD, Former Smoker  - Quit almost 10 years ago.  - Inhalers per PCP   3. HTN  - BP has been high. 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 Continue for now as he may have had PVC CM.  - Check labs    6. Solitary Kidney with CKD 3a - check labs    7. Colon CA - s/p partial colectomy 06/30/2019   Signed, Glori Bickers, MD  09/10/2020 10:37 AM  [place] [address] [phone] (office) [phone] (fax)

## 2020-09-10 ENCOUNTER — Encounter (HOSPITAL_COMMUNITY): Payer: Self-pay | Admitting: Internal Medicine

## 2020-09-10 ENCOUNTER — Other Ambulatory Visit: Payer: Self-pay

## 2020-09-10 ENCOUNTER — Ambulatory Visit (HOSPITAL_COMMUNITY)
Admission: RE | Admit: 2020-09-10 | Discharge: 2020-09-10 | Disposition: A | Discharge status: Home / Self Care | Payer: Medicare Other | Source: Ambulatory Visit | Attending: Internal Medicine | Admitting: Internal Medicine

## 2020-09-10 VITALS — BP 158/88 | HR 66 | Wt 164.4 lb

## 2020-09-10 DIAGNOSIS — J449 Chronic obstructive pulmonary disease, unspecified: Secondary | ICD-10-CM | POA: Insufficient documentation

## 2020-09-10 DIAGNOSIS — Z8249 Family history of ischemic heart disease and other diseases of the circulatory system: Secondary | ICD-10-CM | POA: Insufficient documentation

## 2020-09-10 DIAGNOSIS — Z87891 Personal history of nicotine dependence: Secondary | ICD-10-CM | POA: Diagnosis not present

## 2020-09-10 DIAGNOSIS — Z888 Allergy status to other drugs, medicaments and biological substances status: Secondary | ICD-10-CM | POA: Insufficient documentation

## 2020-09-10 DIAGNOSIS — I428 Other cardiomyopathies: Secondary | ICD-10-CM | POA: Diagnosis not present

## 2020-09-10 DIAGNOSIS — Z79899 Other long term (current) drug therapy: Secondary | ICD-10-CM | POA: Insufficient documentation

## 2020-09-10 DIAGNOSIS — Z85038 Personal history of other malignant neoplasm of large intestine: Secondary | ICD-10-CM | POA: Diagnosis not present

## 2020-09-10 DIAGNOSIS — Q600 Renal agenesis, unilateral: Secondary | ICD-10-CM

## 2020-09-10 DIAGNOSIS — I5042 Chronic combined systolic (congestive) and diastolic (congestive) heart failure: Secondary | ICD-10-CM | POA: Diagnosis not present

## 2020-09-10 DIAGNOSIS — N182 Chronic kidney disease, stage 2 (mild): Secondary | ICD-10-CM | POA: Diagnosis not present

## 2020-09-10 DIAGNOSIS — N1831 Chronic kidney disease, stage 3a: Secondary | ICD-10-CM | POA: Insufficient documentation

## 2020-09-10 DIAGNOSIS — Z9049 Acquired absence of other specified parts of digestive tract: Secondary | ICD-10-CM | POA: Insufficient documentation

## 2020-09-10 DIAGNOSIS — I130 Hypertensive heart and chronic kidney disease with heart failure and stage 1 through stage 4 chronic kidney disease, or unspecified chronic kidney disease: Secondary | ICD-10-CM | POA: Diagnosis not present

## 2020-09-10 DIAGNOSIS — I493 Ventricular premature depolarization: Secondary | ICD-10-CM | POA: Insufficient documentation

## 2020-09-10 DIAGNOSIS — I5022 Chronic systolic (congestive) heart failure: Secondary | ICD-10-CM | POA: Diagnosis not present

## 2020-09-10 DIAGNOSIS — R634 Abnormal weight loss: Secondary | ICD-10-CM

## 2020-09-10 DIAGNOSIS — Z7951 Long term (current) use of inhaled steroids: Secondary | ICD-10-CM | POA: Insufficient documentation

## 2020-09-10 LAB — COMPREHENSIVE METABOLIC PANEL
ALT: 20 U/L (ref 0–44)
AST: 16 U/L (ref 15–41)
Albumin: 3.8 g/dL (ref 3.5–5.0)
Alkaline Phosphatase: 78 U/L (ref 38–126)
Anion gap: 11 (ref 5–15)
BUN: 17 mg/dL (ref 8–23)
CO2: 22 mmol/L (ref 22–32)
Calcium: 9.2 mg/dL (ref 8.9–10.3)
Chloride: 107 mmol/L (ref 98–111)
Creatinine, Ser: 1.53 mg/dL — ABNORMAL HIGH (ref 0.61–1.24)
GFR, Estimated: 50 mL/min — ABNORMAL LOW (ref >60)
Glucose, Bld: 104 mg/dL — ABNORMAL HIGH (ref 70–99)
Potassium: 3.7 mmol/L (ref 3.5–5.1)
Sodium: 140 mmol/L (ref 135–145)
Total Bilirubin: 0.9 mg/dL (ref 0.3–1.2)
Total Protein: 6.9 g/dL (ref 6.5–8.1)

## 2020-09-10 LAB — T4, FREE: Free T4: 1.27 ng/dL — ABNORMAL HIGH (ref 0.61–1.12)

## 2020-09-10 LAB — TSH: TSH: 1.664 u[IU]/mL (ref 0.350–4.500)

## 2020-09-10 MED ORDER — LOSARTAN POTASSIUM 50 MG PO TABS: 50.0000 mg | ORAL_TABLET | Freq: Every day | ORAL | 3 refills | Status: DC

## 2020-09-10 NOTE — Addendum Note (Signed)
Encounter addended by: Shonna Chock, CMA on: 8/59/0931 10:57 AM  Actions taken: Visit diagnoses modified, Charge Capture section accepted, Order list changed, Diagnosis association updated, Clinical Note Signed

## 2020-09-10 NOTE — Addendum Note (Signed)
Encounter addended by: Jolaine Artist, MD on: 09/10/2020 10:46 AM  Actions taken: Clinical Note Signed

## 2020-09-10 NOTE — Patient Instructions (Addendum)
Labs done today. We will contact you only if your labs are abnormal.  INCREASE Losartan to 50mg  (1 tablet) by mouth daily.  No other medication changes were made. Please continue all current medications as prescribed.  CONGRATULATIONS YOU HAVE GRADUATED FROM THE [facility]!!! Please follow up with [facility] at [facility] in 6 months.    Your physician recommends that you schedule a follow-up appointment soon for an echo.  Your physician has requested that you have an echocardiogram. Echocardiography is a painless test that uses sound waves to create images of your heart. It provides your doctor with information about the size and shape of your heart and how well your heart's chambers and valves are working. This procedure takes approximately one hour. There are no restrictions for this procedure.   If you have any questions or concerns before your next appointment please send [place] a message through [place] or call our office at [phone].    TO LEAVE A MESSAGE FOR THE NURSE SELECT OPTION 2, PLEASE LEAVE A MESSAGE INCLUDING: . YOUR NAME . DATE OF BIRTH . CALL BACK NUMBER . REASON FOR CALL**this is important as we prioritize the call backs  YOU WILL RECEIVE A CALL BACK THE SAME DAY AS LONG AS YOU CALL BEFORE 4:00 PM   Do the following things EVERYDAY: 1) Weigh yourself in the morning before breakfast. Write it down and keep it in a log. 2) Take your medicines as prescribed 3) Eat low salt foods--Limit salt (sodium) to 2000 mg per day.  4) Stay as active as you can everyday 5) Limit all fluids for the day to less than 2 liters   At the [facility], you and your health needs are our priority. As part of our continuing mission to provide you with exceptional heart care, we have created designated Provider Care Teams. These Care Teams include your primary Cardiologist (physician) and Advanced Practice Providers (APPs- Physician Assistants and Nurse  Practitioners) who all work together to provide you with the care you need, when you need it.   You may see any of the following providers on your designated Care Team at your next follow up: Marland Kitchen Dr Glori Bickers . Dr Loralie Champagne . Darrick Grinder, NP . Lyda Jester, PA . Audry Riles, PharmD   Please be sure to bring in all your medications bottles to every appointment.

## 2020-09-11 ENCOUNTER — Ambulatory Visit: Payer: Self-pay | Admitting: Genetic Counselor

## 2020-09-11 ENCOUNTER — Telehealth: Payer: Self-pay | Admitting: Genetic Counselor

## 2020-09-11 DIAGNOSIS — Z1379 Encounter for other screening for genetic and chromosomal anomalies: Secondary | ICD-10-CM

## 2020-09-11 NOTE — Progress Notes (Signed)
HPI:  [name] was previously seen in the [place] clinic due to a personal history of colon cancer and concerns regarding a hereditary predisposition to cancer. Please refer to our prior cancer genetics clinic note for more information regarding our discussion, assessment and recommendations, at the time. [name] recent genetic test results were disclosed to him, as were recommendations warranted by these results. These results and recommendations are discussed in more detail below.  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   07/21/2019 Cancer Staging   Staging form: Colon and Rectum, AJCC 8th Edition - Clinical: Stage IIIC (cT4b, cN2, cM0) - Signed by Ladell Pier, MD on 07/21/2019   08/04/2019 -  Chemotherapy   The patient had palonosetron (ALOXI) injection 0.25 mg, 0.25 mg, Intravenous,  Once, 10 of 10 cycles Administration: 0.25 mg (08/04/2019), 0.25 mg (08/18/2019), 0.25 mg (08/31/2019), 0.25 mg (10/12/2019), 0.25 mg (10/25/2019), 0.25 mg (11/08/2019), 0.25 mg (11/23/2019), 0.25 mg (12/06/2019), 0.25 mg (12/20/2019), 0.25 mg (01/03/2020) pegfilgrastim-cbqv (UDENYCA) injection 6 mg, 6 mg, Subcutaneous, Once, 8 of 8 cycles Administration: 6 mg (09/02/2019), 6 mg (10/14/2019), 6 mg (10/27/2019), 6 mg (11/10/2019), 6 mg (11/25/2019), 6 mg (12/08/2019), 6 mg (12/22/2019), 6 mg (01/05/2020) leucovorin 732 mg in dextrose 5 % 250 mL infusion, 400 mg/m2 = 732 mg, Intravenous,  Once, 12 of 12 cycles Dose modification: 300 mg/m2 (original dose 400 mg/m2, Cycle 12, Reason: Provider Judgment) Administration: 732 mg (08/04/2019), 732 mg (08/18/2019), 732 mg (08/31/2019), 732 mg (10/12/2019), 732 mg (10/25/2019), 732 mg (11/08/2019), 732 mg (11/23/2019), 732 mg (12/06/2019), 732 mg (12/20/2019), 732 mg (01/03/2020), 732 mg (01/17/2020), 550 mg (01/31/2020) oxaliplatin  (ELOXATIN) 120 mg in dextrose 5 % 500 mL chemo infusion, 65 mg/m2 = 120 mg (76.5 % of original dose 85 mg/m2), Intravenous,  Once, 10 of 10 cycles Dose modification: 65 mg/m2 (original dose 85 mg/m2, Cycle 1, Reason: Change in SCr/CrCl) Administration: 120 mg (08/04/2019), 120 mg (08/18/2019), 120 mg (08/31/2019), 120 mg (10/12/2019), 120 mg (10/25/2019), 120 mg (11/08/2019), 120 mg (11/23/2019), 120 mg (12/06/2019), 120 mg (12/20/2019), 120 mg (01/03/2020) fluorouracil (ADRUCIL) chemo injection 750 mg, 400 mg/m2 = 750 mg, Intravenous,  Once, 12 of 12 cycles Dose modification: 300 mg/m2 (original dose 400 mg/m2, Cycle 12, Reason: Provider Judgment) Administration: 750 mg (08/04/2019), 750 mg (08/18/2019), 750 mg (08/31/2019), 750 mg (10/12/2019), 750 mg (10/25/2019), 750 mg (11/08/2019), 750 mg (11/23/2019), 750 mg (12/06/2019), 750 mg (12/20/2019), 750 mg (01/03/2020), 750 mg (01/17/2020), 550 mg (01/31/2020) fluorouracil (ADRUCIL) 4,400 mg in sodium chloride 0.9 % 62 mL chemo infusion, 2,400 mg/m2 = 4,400 mg, Intravenous, 1 Day/Dose, 12 of 12 cycles Dose modification: 1,800 mg/m2 (original dose 2,400 mg/m2, Cycle 12, Reason: Provider Judgment) Administration: 4,400 mg (08/04/2019), 4,400 mg (08/18/2019), 4,400 mg (08/31/2019), 4,400 mg (10/12/2019), 4,400 mg (10/25/2019), 4,400 mg (11/08/2019), 4,400 mg (11/23/2019), 4,400 mg (12/06/2019), 4,400 mg (12/20/2019), 4,400 mg (01/03/2020), 4,400 mg (01/17/2020), 3,300 mg (01/31/2020)  for chemotherapy treatment.      FAMILY HISTORY:  We obtained a detailed, 4-generation family history.  Significant diagnoses are listed below: Family History  Problem Relation Age of Onset  . Heart disease Father   . Heart disease Daughter        "fluid around heart"   . Canavan disease Daughter   . Cancer Daughter        unsure of type  .  Asthma Son   . Heart failure Mother   . Kidney failure Mother   . Colon cancer Neg Hx   . Esophageal cancer Neg Hx   . Rectal cancer Neg Hx   . Stomach cancer Neg Hx      The patient has eight children, one was diagnosed with cancer in her calf muscle at age 40 and died at 15.  He has one brother and six sisters who are all cancer free.  Both parents are deceased.  The patient's father died in his 35's from congestive heart failure.  He does not know the paternal side of the family.  The patient's mother died of a heart attack and kidney failure at 35.  She had multiple siblings who were reportedly cancer free.  The maternal grandparents died of old age.  [name] is unaware of previous family history of genetic testing for hereditary cancer risks. Patient's maternal ancestors are of African American descent, and paternal ancestors are of African American descent. There is no reported Ashkenazi Jewish ancestry. There is no known consanguinity.  GENETIC TEST RESULTS: Genetic testing reported out on September 06, 2020 through the CancerNext-Expanded+RNAinsight cancer panel found no pathogenic mutations. The CancerNext-Expanded gene panel offered by [facility] and includes sequencing and rearrangement analysis for the following 77 genes: AIP, ALK, APC*, ATM*, AXIN2, BAP1, BARD1, BLM, BMPR1A, BRCA1*, BRCA2*, BRIP1*, CDC73, CDH1*, CDK4, CDKN1B, CDKN2A, CHEK2*, CTNNA1, DICER1, FANCC, FH, FLCN, GALNT12, KIF1B, LZTR1, MAX, MEN1, MET, MLH1*, MSH2*, MSH3, MSH6*, MUTYH*, NBN, NF1*, NF2, NTHL1, PALB2*, PHOX2B, PMS2*, POT1, PRKAR1A, PTCH1, PTEN*, RAD51C*, RAD51D*, RB1, RECQL, RET, SDHA, SDHAF2, SDHB, SDHC, SDHD, SMAD4, SMARCA4, SMARCB1, SMARCE1, STK11, SUFU, TMEM127, TP53*, TSC1, TSC2, VHL and XRCC2 (sequencing and deletion/duplication); EGFR, EGLN1, HOXB13, KIT, MITF, PDGFRA, POLD1, and POLE (sequencing only); EPCAM and GREM1 (deletion/duplication only). DNA and RNA analyses performed for * genes. The test report has been scanned into EPIC and is located under the Molecular Pathology section of the Results Review tab.  A portion of the result report is included below for  reference.     We discussed with [name] that because current genetic testing is not perfect, it is possible there may be a gene mutation in one of these genes that current testing cannot detect, but that chance is small.  We also discussed, that there could be another gene that has not yet been discovered, or that we have not yet tested, that is responsible for the cancer diagnoses in the family. It is also possible there is a hereditary cause for the cancer in the family that [name] did not inherit and therefore was not identified in his testing.  Therefore, it is important to remain in touch with cancer genetics in the future so that we can continue to offer [name] the most up to date genetic testing.   Genetic testing did identify two Variants of uncertain significance (VUS) - one in the DICER1 gene called p.N588K and a second in the RET gene called p.A513G.  At this time, it is unknown if these variants are associated with increased cancer risk or if they are normal findings, but most variants such as these get reclassified to being inconsequential. They should not be used to make medical management decisions. With time, we suspect the lab will determine the significance of these variants, if any. If we do learn more about them, we will try to contact [name] to discuss it further. However, it is important to stay in  touch with [place] periodically and keep the address and phone number up to date.  ADDITIONAL GENETIC TESTING: We discussed with [name] that his genetic testing was fairly extensive.  If there are genes identified to increase cancer risk that can be analyzed in the future, we would be happy to discuss and coordinate this testing at that time.    CANCER SCREENING RECOMMENDATIONS: [name] test result is considered negative (normal).  This means that we have not identified a hereditary cause for his personal history of colon cancer at this time. Most cancers  happen by chance and this negative test suggests that his cancer may fall into this category.    While reassuring, this does not definitively rule out a hereditary predisposition to cancer. It is still possible that there could be genetic mutations that are undetectable by current technology. There could be genetic mutations in genes that have not been tested or identified to increase cancer risk.  Therefore, it is recommended he continue to follow the cancer management and screening guidelines provided by his oncology and primary healthcare provider.   An individual's cancer risk and medical management are not determined by genetic test results alone. Overall cancer risk assessment incorporates additional factors, including personal medical history, family history, and any available genetic information that may result in a personalized plan for cancer prevention and surveillance  RECOMMENDATIONS FOR FAMILY MEMBERS:  Individuals in this family might be at some increased risk of developing cancer, over the general population risk, simply due to the family history of cancer.  We recommended women in this family have a yearly mammogram beginning at age 34, or 59 years younger than the earliest onset of cancer, an annual clinical breast exam, and perform monthly breast self-exams. Women in this family should also have a gynecological exam as recommended by their primary provider. All family members should be referred for colonoscopy starting at age 52.  FOLLOW-UP: Lastly, we discussed with [name] that cancer genetics is a rapidly advancing field and it is possible that new genetic tests will be appropriate for him and/or his family members in the future. We encouraged him to remain in contact with cancer genetics on an annual basis so we can update his personal and family histories and let him know of advances in cancer genetics that may benefit this family.   Our contact number was provided. Mr.  Joe Garcia questions were answered to his satisfaction, and he knows he is welcome to call us at anytime with additional questions or concerns.   Joe Garcia, [place], [facility] Licensed, Certified Genetic Counselor [email or web address]

## 2020-09-11 NOTE — Telephone Encounter (Signed)
Revealed negative genetic testing.  Discussed that we do not know why he has colon cancer. It could be due to a different gene that we are not testing, or maybe our current technology may not be able to pick something up.  It will be important for him to keep in contact with genetics to keep up with whether additional testing may be needed.  He has two VUS that will not change medical management.

## 2020-09-12 ENCOUNTER — Other Ambulatory Visit (HOSPITAL_COMMUNITY): Payer: Self-pay | Admitting: Internal Medicine

## 2020-09-12 ENCOUNTER — Other Ambulatory Visit (HOSPITAL_COMMUNITY): Payer: Self-pay | Admitting: Adult Health

## 2020-09-12 LAB — T3, FREE: T3, Free: 2.5 pg/mL (ref 2.0–4.4)

## 2020-09-20 ENCOUNTER — Ambulatory Visit (HOSPITAL_COMMUNITY)
Admission: RE | Admit: 2020-09-20 | Discharge: 2020-09-20 | Disposition: A | Discharge status: Home / Self Care | Payer: Medicare Other | Source: Ambulatory Visit | Attending: Internal Medicine | Admitting: Internal Medicine

## 2020-09-20 ENCOUNTER — Other Ambulatory Visit: Payer: Self-pay

## 2020-09-20 DIAGNOSIS — N189 Chronic kidney disease, unspecified: Secondary | ICD-10-CM | POA: Insufficient documentation

## 2020-09-20 DIAGNOSIS — R011 Cardiac murmur, unspecified: Secondary | ICD-10-CM | POA: Insufficient documentation

## 2020-09-20 DIAGNOSIS — I5042 Chronic combined systolic (congestive) and diastolic (congestive) heart failure: Secondary | ICD-10-CM | POA: Diagnosis not present

## 2020-09-20 DIAGNOSIS — Z87891 Personal history of nicotine dependence: Secondary | ICD-10-CM | POA: Diagnosis not present

## 2020-09-20 DIAGNOSIS — J449 Chronic obstructive pulmonary disease, unspecified: Secondary | ICD-10-CM | POA: Insufficient documentation

## 2020-09-20 DIAGNOSIS — I351 Nonrheumatic aortic (valve) insufficiency: Secondary | ICD-10-CM | POA: Insufficient documentation

## 2020-09-20 DIAGNOSIS — E785 Hyperlipidemia, unspecified: Secondary | ICD-10-CM | POA: Diagnosis not present

## 2020-09-20 DIAGNOSIS — I252 Old myocardial infarction: Secondary | ICD-10-CM | POA: Insufficient documentation

## 2020-09-20 LAB — ECHOCARDIOGRAM COMPLETE
Area-P 1/2: 2.83 cm2
Calc EF: 44.7 %
S' Lateral: 4.7 cm
Single Plane A2C EF: 41.4 %
Single Plane A4C EF: 49.7 %

## 2020-09-20 NOTE — Progress Notes (Signed)
  Echocardiogram 2D Echocardiogram has been performed.  Michiel Cowboy 09/20/2020, 9:40 AM

## 2020-09-24 ENCOUNTER — Other Ambulatory Visit: Payer: Self-pay | Admitting: General Surgery

## 2020-09-24 DIAGNOSIS — Z95828 Presence of other vascular implants and grafts: Secondary | ICD-10-CM

## 2020-09-27 ENCOUNTER — Inpatient Hospital Stay: Payer: Medicare Other

## 2020-09-27 ENCOUNTER — Inpatient Hospital Stay: Payer: Medicare Other | Attending: Nurse Practitioner

## 2020-09-27 ENCOUNTER — Inpatient Hospital Stay: Payer: Medicare Other | Admitting: Oncology

## 2020-09-27 ENCOUNTER — Other Ambulatory Visit: Payer: Self-pay

## 2020-09-27 DIAGNOSIS — T451X5A Adverse effect of antineoplastic and immunosuppressive drugs, initial encounter: Secondary | ICD-10-CM | POA: Insufficient documentation

## 2020-09-27 DIAGNOSIS — C772 Secondary and unspecified malignant neoplasm of intra-abdominal lymph nodes: Secondary | ICD-10-CM | POA: Diagnosis not present

## 2020-09-27 DIAGNOSIS — D509 Iron deficiency anemia, unspecified: Secondary | ICD-10-CM | POA: Insufficient documentation

## 2020-09-27 DIAGNOSIS — G620 Drug-induced polyneuropathy: Secondary | ICD-10-CM | POA: Insufficient documentation

## 2020-09-27 DIAGNOSIS — Z95828 Presence of other vascular implants and grafts: Secondary | ICD-10-CM

## 2020-09-27 DIAGNOSIS — C184 Malignant neoplasm of transverse colon: Secondary | ICD-10-CM

## 2020-09-27 DIAGNOSIS — Z8616 Personal history of COVID-19: Secondary | ICD-10-CM | POA: Insufficient documentation

## 2020-09-27 DIAGNOSIS — I5022 Chronic systolic (congestive) heart failure: Secondary | ICD-10-CM | POA: Insufficient documentation

## 2020-09-27 DIAGNOSIS — J45909 Unspecified asthma, uncomplicated: Secondary | ICD-10-CM | POA: Diagnosis not present

## 2020-09-27 DIAGNOSIS — Z79899 Other long term (current) drug therapy: Secondary | ICD-10-CM | POA: Diagnosis not present

## 2020-09-27 MED ORDER — SODIUM CHLORIDE 0.9% FLUSH
10.0000 mL | Freq: Once | INTRAVENOUS | Status: AC
  Administered 2020-09-27: 10 mL
  Filled 2020-09-27: qty 10

## 2020-09-27 MED ORDER — HEPARIN SOD (PORK) LOCK FLUSH 100 UNIT/ML IV SOLN
500.0000 [IU] | Freq: Once | INTRAVENOUS | Status: AC
  Administered 2020-09-27: 500 [IU]
  Filled 2020-09-27: qty 5

## 2020-09-27 NOTE — Patient Instructions (Signed)
Implanted Port Insertion, Care After This sheet gives you information about how to care for yourself after your procedure. Your health care provider may also give you more specific instructions. If you have problems or questions, contact your health care provider. What can I expect after the procedure? After the procedure, it is common to have:  Discomfort at the port insertion site.  Bruising on the skin over the port. This should improve over 3-4 days. Follow these instructions at home: Port care  After your port is placed, you will get a manufacturer's information card. The card has information about your port. Keep this card with you at all times.  Take care of the port as told by your health care provider. Ask your health care provider if you or a family member can get training for taking care of the port at home. A home health care nurse may also take care of the port.  Make sure to remember what type of port you have. Incision care  Follow instructions from your health care provider about how to take care of your port insertion site. Make sure you: ? Wash your hands with soap and water before and after you change your bandage (dressing). If soap and water are not available, use hand sanitizer. ? Change your dressing as told by your health care provider. ? Leave stitches (sutures), skin glue, or adhesive strips in place. These skin closures may need to stay in place for 2 weeks or longer. If adhesive strip edges start to loosen and curl up, you may trim the loose edges. Do not remove adhesive strips completely unless your health care provider tells you to do that.  Check your port insertion site every day for signs of infection. Check for: ? Redness, swelling, or pain. ? Fluid or blood. ? Warmth. ? Pus or a bad smell.      Activity  Return to your normal activities as told by your health care provider. Ask your health care provider what activities are safe for you.  Do not  lift anything that is heavier than 10 lb (4.5 kg), or the limit that you are told, until your health care provider says that it is safe. General instructions  Take over-the-counter and prescription medicines only as told by your health care provider.  Do not take baths, swim, or use a hot tub until your health care provider approves. Ask your health care provider if you may take showers. You may only be allowed to take sponge baths.  Do not drive for 24 hours if you were given a sedative during your procedure.  Wear a medical alert bracelet in case of an emergency. This will tell any health care providers that you have a port.  Keep all follow-up visits as told by your health care provider. This is important. Contact a health care provider if:  You cannot flush your port with saline as directed, or you cannot draw blood from the port.  You have a fever or chills.  You have redness, swelling, or pain around your port insertion site.  You have fluid or blood coming from your port insertion site.  Your port insertion site feels warm to the touch.  You have pus or a bad smell coming from the port insertion site. Get help right away if:  You have chest pain or shortness of breath.  You have bleeding from your port that you cannot control. Summary  Take care of the port as told by your   health care provider. Keep the manufacturer's information card with you at all times.  Change your dressing as told by your health care provider.  Contact a health care provider if you have a fever or chills or if you have redness, swelling, or pain around your port insertion site.  Keep all follow-up visits as told by your health care provider. This information is not intended to replace advice given to you by your health care provider. Make sure you discuss any questions you have with your health care provider. Document Revised: 02/09/2018 Document Reviewed: 02/09/2018 Elsevier Patient Education   2021 Elsevier Inc.  

## 2020-09-28 ENCOUNTER — Telehealth: Payer: Self-pay | Admitting: *Deleted

## 2020-09-28 LAB — CEA (IN HOUSE-CHCC): CEA (CHCC-In House): 1.12 ng/mL (ref 0.00–5.00)

## 2020-09-28 NOTE — Telephone Encounter (Signed)
Called patient and rescheduled him for 10/16/20 at 0830. He will call for his [facility] transport. He reports his PCP with [facility] had a Cologuard test done and it returned positive. Informed him that his CEA was stable/normal. Called PCP office and requested they fax this report to [phone].

## 2020-10-03 NOTE — Telephone Encounter (Signed)
Notified via fax from PCP office nurse that patient tested positive on a FIT test and not Cologuard test.

## 2020-10-10 ENCOUNTER — Other Ambulatory Visit (HOSPITAL_COMMUNITY): Payer: Self-pay | Admitting: Internal Medicine

## 2020-10-15 ENCOUNTER — Other Ambulatory Visit (HOSPITAL_COMMUNITY): Payer: Self-pay | Admitting: *Deleted

## 2020-10-16 ENCOUNTER — Other Ambulatory Visit: Payer: Self-pay

## 2020-10-16 ENCOUNTER — Telehealth: Payer: Self-pay | Admitting: Oncology

## 2020-10-16 ENCOUNTER — Inpatient Hospital Stay (HOSPITAL_BASED_OUTPATIENT_CLINIC_OR_DEPARTMENT_OTHER): Payer: Medicare Other | Admitting: Oncology

## 2020-10-16 VITALS — BP 131/63 | HR 60 | Temp 97.7°F | Resp 14 | Ht 71.0 in | Wt 164.4 lb

## 2020-10-16 DIAGNOSIS — C184 Malignant neoplasm of transverse colon: Secondary | ICD-10-CM | POA: Diagnosis not present

## 2020-10-16 NOTE — Telephone Encounter (Signed)
Called pt per 3/22 los - unable to reach pt. No answer. Mailed letter with appt date and time

## 2020-10-16 NOTE — Progress Notes (Signed)
[facility] OFFICE PROGRESS NOTE   Diagnosis: Colon cancer  INTERVAL HISTORY:   Ms. Housman returns as scheduled.  He continues to have "gas "at the upper abdomen.  He describes belching and reflux symptoms.  Neuropathy symptoms have improved in the hands.  He is scheduled to see Dr. Havery Moros next week. He had a recent positive "fit "test with his primary provider. Objective:  Vital signs in last 24 hours:  Blood pressure 131/63, pulse 60, temperature 97.7 F (36.5 C), temperature source Tympanic, resp. rate 14, height '5\' 11"'  (1.803 m), weight 164 lb 6.4 oz (74.6 kg), SpO2 98 %.     Lymphatics: Pea-sized left cervical node, no supraclavicular, axillary, or inguinal nodes Resp: Lungs clear bilaterally Cardio: Regular rate and rhythm GI: No hepatosplenomegaly, nontender, no mass Vascular: No leg edema  Portacath/PICC-without erythema  Lab Results:  Lab Results  Component Value Date   WBC 5.7 06/07/2020   HGB 12.1 (L) 06/07/2020   HCT 37.1 (L) 06/07/2020   MCV 88.5 06/07/2020   PLT 205 06/07/2020   NEUTROABS 2.8 06/07/2020    CMP  Lab Results  Component Value Date   NA 140 09/10/2020   K 3.7 09/10/2020   CL 107 09/10/2020   CO2 22 09/10/2020   GLUCOSE 104 (H) 09/10/2020   BUN 17 09/10/2020   CREATININE 1.53 (H) 09/10/2020   CALCIUM 9.2 09/10/2020   PROT 6.9 09/10/2020   ALBUMIN 3.8 09/10/2020   AST 16 09/10/2020   ALT 20 09/10/2020   ALKPHOS 78 09/10/2020   BILITOT 0.9 09/10/2020   GFRNONAA 50 (L) 09/10/2020   GFRAA >60 02/13/2020    Lab Results  Component Value Date   CEA1 1.12 09/27/2020     Medications: I have reviewed the patient's current medications.   Assessment/Plan:  Medications: I have reviewed the patient's current medications.  Assessment/Plan: 1. 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 Grade 3, lymphovascular and perineural invasion present, 4/18  lymph nodes positive, positive lymph nodes mesenteric margin, tumor invades into adherent omentum, MSI high,loss of MLH1 and PMS2 expression; MLH1 methylation not detected; BRAF mutation analysis-negative ? CT abdomen/pelvis 06/26/2019-thickening within the distal transverse colon, severely atrophic and calcified right kidney ? CT chest 06/28/2019-no evidence of metastatic disease, stable 3 mm right lower lobe nodule ? Cycle 1 FOLFOX 08/04/2019 (oxaliplatin 65 mg/m due to renal function) ? Plan for repeat CTs after he has completed 5 cycles of chemotherapy ? Cycle 2 FOLFOX 08/18/2019 ? Cycle 3 FOLFOX 08/31/2019, Udenyca added ? Cycle 4 FOLFOX 10/12/2019 ? Cycle 5 FOLFOX 10/25/2019 ? Stable right lower lobe nodule on chest CT 09/21/2019 ? CT abdomen/pelvis 11/07/2019-postoperative changes related to colectomy and anastomosis. No currentevidence of disease. No adenopathy. ? Cycle 6 FOLFOX 11/08/2019 ? Cycle 7 FOLFOX 11/23/2019 ? Cycle 8 FOLFOX 12/06/2019 ? Cycle 9 FOLFOX 12/20/2019 ? Case presented GI tumor conference 12/28/2018-tumor margin negative, positive lymph node at margin; no recommendation for further surgery or radiation; peripancreatic node on baseline CT not seen on April 2021 CT ? Cycle 10 FOLFOX 01/03/2020 ? Cycle 11 FOLFOX 01/17/2020-oxaliplatin and Udenyca held ? Cycle 12 FOLFOX 01/31/2020-oxaliplatin and Udenyca held, 5-FU dose reduced secondary to mucositis ? 05/25/2020 colonoscopy-internal hemorrhoids, repeat colonoscopy in 3 years for surveillance ? CTs 06/11/2020-new trace right pelvic fluid, nonspecific; new mild left upper lobe interstitial/groundglass opacity suspicious for minimal infection or inflammation; similar 2 mm right lower lobe pulmonary nodule 2. Asthma 3. Chronic systolic heart  failure 4. History of PVCs 5. Atrophic right kidney 6. Microcytic anemia, likely iron deficiency anemia secondary to #1 7. Port-A-Cath placement on 12/15/2019, Dr. Rosendo Gros 8. 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 Hospitalized with sepsis/multifocal pneumonia/probable COPD exacerbation July 2021 11. Mammogram 04/04/2020-mild to moderate right gynecomastia  Disposition: Mr. Bridge appears stable.  He is in clinical remission from colon cancer.  He will follow-up with gastroenterology for the occult positive stool and gastritis symptoms.  He would like to keep the Port-A-Cath in place.  He will return for an office visit and CEA in 7-8 weeks.     Betsy Coder MD  10/16/2020  8:39 AM

## 2020-10-20 NOTE — Progress Notes (Signed)
Cardiology Office Note   Date:  10/22/2020   ID:  Joe, Garcia Mar 09, 1951, MRN [id number]  PCP:  [facility]  Cardiologist:  Quay Burow, MD EP: None  Chief Complaint  Patient presents with  . Follow-up    HTN, CHF      History of Present Illness: Joe Garcia is a 70 y.o. male with a PMH of chronic combine CHF/NICM, HTN, COPD, solitary kidney with CKD stage 3a, and colon cancer s/p partial colectomy 06/2019, who presents for follow-up of his CHF.   Patient was last seen by cardiology at an outpatient visit with Dr. [name] 09/10/2020 at which time he had complaints of abdominal swelling, but overall was doing well from a CHF standpoint. His losartan was increased to 50 mg daily for improved blood pressure control.  He was recommended to undergo a repeat echocardiogram which occurred 09/20/2020 showing EF 55 to 60% with very frequent PVCs, G1 DD, and mild MR. He was graduated from the [facility] at that visit.   He presents today to re-establish care with general cardiology after graduating from the HF clinic. He has been doing fairly well from a cardiac standpoint. He has been struggling with gastritis recently and is anticipating an EGD 10/24/20. He does have some burning chest discomfort related to his gastritis, though no exertional chest pain. He reports chronic stable SOB due to his history of asthma. He states he has been out of his lasix for the past week and noticed increased abdominal bloating and mild orthopnea but no PND or LE edema. He reports SBP is typically in the 140s-160s at home despite medication compliance.     Past Medical History:  Diagnosis Date  . Alcoholism ([place])   . Allergy   . Arthritis    hips, L shoulder  . Asthma   . Cancer ([place]) 05/2019   colon cancer  . CHF (congestive heart failure) ([place])   . Chronic kidney disease    only has one kidney  . COPD (chronic obstructive pulmonary disease) ([place])   .  COVID-19   . Depression    pt denies  . Dyspnea   . Dysrhythmia    PVC's  . Family history of anesthesia complication   . GERD (gastroesophageal reflux disease)   . Heart murmur   . Hyperlipidemia   . Hypertension   . Myocardial infarction [facility])    pt is unsure of date- believes early 2020  . Osteoporosis   . Pneumonia     Past Surgical History:  Procedure Laterality Date  . BIOPSY  06/27/2019   Procedure: BIOPSY;  Surgeon: Yetta Flock, MD;  Location: [facility] ENDOSCOPY;  Service: Gastroenterology;;  . CATARACT EXTRACTION    . COLONOSCOPY Left 06/27/2019   Procedure: COLONOSCOPY;  Surgeon: Yetta Flock, MD;  Location[facility] ENDOSCOPY;  Service: Gastroenterology;  Laterality: Left;  . COLONOSCOPY    . LAPAROTOMY N/A 06/30/2019   Procedure: EXPLORATORY LAPAROTOMY;  Surgeon: Ralene Ok, MD;  Location: [place];  Service: General;  Laterality: N/A;  . LEFT HEART CATH AND CORONARY ANGIOGRAPHY N/A 10/04/2018   Procedure: LEFT HEART CATH AND CORONARY ANGIOGRAPHY;  Surgeon: Jettie Booze, MD;  Location: [place] CV LAB;  Service: Cardiovascular;  Laterality: N/A;  . PARTIAL COLECTOMY N/A 06/30/2019   Procedure: PARTIAL COLECTOMY;  Surgeon: Ralene Ok, MD;  Location: [place];  Service: General;  Laterality: N/A;  . PORTACATH PLACEMENT Left 08/02/2019   Procedure: INSERTION  PORT-A-CATH LEFT CHEST;  Surgeon: Ralene Ok, MD;  Location: [place];  Service: General;  Laterality: Left;  . RIGHT/LEFT HEART CATH AND CORONARY ANGIOGRAPHY N/A 06/12/2017   Procedure: RIGHT/LEFT HEART CATH AND CORONARY ANGIOGRAPHY;  Surgeon: Jolaine Artist, MD;  Location: [place] CV LAB;  Service: Cardiovascular;  Laterality: N/A;     Current Outpatient Medications  Medication Sig Dispense Refill  . albuterol (VENTOLIN HFA) 108 (90 Base) MCG/ACT inhaler Inhale 2 puffs into the lungs every 4 (four) hours as needed for wheezing or shortness of breath. 18 g 0  . amiodarone (PACERONE)  100 MG tablet Take 1 tablet (100 mg total) by mouth daily. 30 tablet 3  . amLODipine (NORVASC) 5 MG tablet Take 1 tablet (5 mg total) by mouth daily. 180 tablet 3  . budesonide-formoterol (SYMBICORT) 160-4.5 MCG/ACT inhaler Inhale 2 puffs into the lungs 2 (two) times daily. 1 Inhaler 11  . carvedilol (COREG) 3.125 MG tablet TAKE 1 TABLET BY MOUTH TWICE DAILY WITH A MEAL. 60 tablet 5  . clarithromycin (BIAXIN XL) 500 MG 24 hr tablet Take 2 tablets (1,000 mg total) by mouth daily. 7 tablet 0  . famotidine (PEPCID) 20 MG tablet Take 40 mg by mouth 2 (two) times daily.    . fluticasone (FLONASE) 50 MCG/ACT nasal spray Place 2 sprays into both nostrils daily as needed for allergies or rhinitis.    Marland Kitchen isosorbide-hydrALAZINE (BIDIL) 20-37.5 MG tablet Take 1 tablet by mouth 3 (three) times daily.    Marland Kitchen losartan (COZAAR) 50 MG tablet Take 1 tablet (50 mg total) by mouth daily. 90 tablet 3  . montelukast (SINGULAIR) 10 MG tablet Take 10 mg by mouth at bedtime.    Marland Kitchen omeprazole (PRILOSEC) 40 MG capsule Take 40 mg by mouth daily. For 8 weeks    . furosemide (LASIX) 20 MG tablet Take 1 tablet (20 mg total) by mouth daily. 90 tablet 3   No current facility-administered medications for this visit.    Allergies:   Clarithromycin and Lisinopril    Social History:  The patient  reports that he quit smoking about 10 years ago. He has a 4.50 pack-year smoking history. He has never used smokeless tobacco. He reports previous drug use. He reports that he does not drink alcohol.   Family History:  The patient's family history includes Asthma in his son; Canavan disease in his daughter; Cancer in his daughter; Heart disease in his daughter and father; Heart failure in his mother; Kidney failure in his mother.    ROS:  Please see the history of present illness.   Otherwise, review of systems are positive for none.   All other systems are reviewed and negative.    PHYSICAL EXAM: VS:  BP (!) 160/90   Pulse (!) 56    Ht 5\' 11"  (1.803 m)   Wt 155 lb 6.4 oz (70.5 kg)   SpO2 99%   BMI 21.67 kg/m  , BMI Body mass index is 21.67 kg/m. GEN: Well nourished, well developed, in no acute distress HEENT: sclera anicteric Neck: no JVD, carotid bruits, or masses Cardiac: RRR; no murmurs, rubs, or gallops, no peripheral edema  Respiratory:  clear to auscultation bilaterally, normal work of breathing GI: soft, nontender, mild bloating, + BS MS: no deformity or atrophy Skin: warm and dry, no rash Neuro:  Strength and sensation are intact Psych: euthymic mood, full affect   EKG:  EKG is not ordered today.   Recent Labs: 02/13/2020: Magnesium 1.9 06/07/2020:  Hemoglobin 12.1; Platelet Count 205 09/10/2020: ALT 20; BUN 17; Creatinine, Ser 1.53; Potassium 3.7; Sodium 140; TSH 1.664    Lipid Panel    Component Value Date/Time   CHOL 154 10/04/2018 0218   TRIG 39 10/04/2018 0218   HDL 48 10/04/2018 0218   CHOLHDL 3.2 10/04/2018 0218   VLDL 8 10/04/2018 0218   LDLCALC 98 10/04/2018 0218      Wt Readings from Last 3 Encounters:  10/22/20 155 lb 6.4 oz (70.5 kg)  10/16/20 164 lb 6.4 oz (74.6 kg)  09/10/20 164 lb 6.4 oz (74.6 kg)      Other studies Reviewed: Additional studies/ records that were reviewed today include:   Echocardiogram 09/20/20: 1. LVEF 55-60%, very frequent PVCs during the acquisition.  2. Left ventricular ejection fraction, by estimation, is 55 to 60%. The  left ventricle has normal function. The left ventricle has no regional  wall motion abnormalities. The left ventricular internal cavity size was  mildly dilated. Left ventricular  diastolic parameters are consistent with Grade I diastolic dysfunction  (impaired relaxation). Elevated left atrial pressure.  3. Right ventricular systolic function is normal. The right ventricular  size is normal. There is normal pulmonary artery systolic pressure.  4. The mitral valve is normal in structure. Mild mitral valve  regurgitation. No  evidence of mitral stenosis.  5. The aortic valve is normal in structure. Aortic valve regurgitation is  mild. No aortic stenosis is present.  6. The inferior vena cava is normal in size with greater than 50%  respiratory variability, suggesting right atrial pressure of 3 mmHg.   LHC 0/1601:  LV end diastolic pressure is low.  There is no aortic valve stenosis.  No significant CAD.   Medical therapy for nonischemic cardiomyopathy, LV dysfunction.     ASSESSMENT AND PLAN:   1. Chronic combined CHF/NICM: EF as low as 30-35% 09/2018 with normal coronary arteries on cath at that time. No with recovery of EF 55-60% on echo 08/2020. Not on ARNI due to history of angioedema on lisinopril, though tolerating losartan. He has some recent abdominal bloating and orthopnea in the setting of being out of his lasix for the past week - Will resume lasix - patient instructed to take 40mg  x3 days, then resume 20mg  daily - Continue bidil, carvedilol, and losartan - Continue to limit salt intake and monitor daily weights  2. HTN: BP 160/90 today. He has bradycardia limiting titration of carvedilol and solitary kidney with CKD stage 3 limiting titration of losartan.  - Will start amlodipine 5mg  daily  - Continue bidil, carvedilol, and losartan - Patient to keep a BP log and plan to follow-up for pharmacy HTN clinic visit in 1 month for medication titration  3. COPD: former smoker - Continue inhalers per PCP  4. 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 Thought to be contributing to his cardiomyopathy. CT Chest 05/2020 without pulmonary fibrosis. TSH and LFTs wnl on recent labs 08/2020 - Continue amiodarone 100mg  daily  5. Solitary kidney with CKD stage 3a: Cr stable at 1.5 08/2020 which appears to be about baseline.  - Will repeat BMET today given increase in losartan since lab work last month.   Current medicines are reviewed at length with the patient today.  The patient does  not have concerns regarding medicines.  The following changes have been made:  As above  Labs/ tests ordered today include:   Orders Placed This Encounter  Procedures  . Basic metabolic  panel     Disposition:   FU with pharmacy for HTN visit in 1 month and Dr. Gwenlyn Found in 6 months  Signed, Abigail Butts, PA-C  10/22/2020 12:30 PM

## 2020-10-22 ENCOUNTER — Ambulatory Visit (INDEPENDENT_AMBULATORY_CARE_PROVIDER_SITE_OTHER): Payer: Medicare Other | Admitting: Medical

## 2020-10-22 ENCOUNTER — Encounter: Payer: Self-pay | Admitting: Medical

## 2020-10-22 ENCOUNTER — Other Ambulatory Visit: Payer: Self-pay

## 2020-10-22 VITALS — BP 160/90 | HR 56 | Ht 71.0 in | Wt 155.4 lb

## 2020-10-22 DIAGNOSIS — N1831 Chronic kidney disease, stage 3a: Secondary | ICD-10-CM

## 2020-10-22 DIAGNOSIS — Q600 Renal agenesis, unilateral: Secondary | ICD-10-CM

## 2020-10-22 DIAGNOSIS — I428 Other cardiomyopathies: Secondary | ICD-10-CM | POA: Diagnosis not present

## 2020-10-22 DIAGNOSIS — I10 Essential (primary) hypertension: Secondary | ICD-10-CM

## 2020-10-22 DIAGNOSIS — I493 Ventricular premature depolarization: Secondary | ICD-10-CM

## 2020-10-22 DIAGNOSIS — J449 Chronic obstructive pulmonary disease, unspecified: Secondary | ICD-10-CM

## 2020-10-22 DIAGNOSIS — Z79899 Other long term (current) drug therapy: Secondary | ICD-10-CM

## 2020-10-22 DIAGNOSIS — I5042 Chronic combined systolic (congestive) and diastolic (congestive) heart failure: Secondary | ICD-10-CM

## 2020-10-22 LAB — BASIC METABOLIC PANEL
BUN/Creatinine Ratio: 8 — ABNORMAL LOW (ref 10–24)
BUN: 11 mg/dL (ref 8–27)
CO2: 21 mmol/L (ref 20–29)
Calcium: 9.4 mg/dL (ref 8.6–10.2)
Chloride: 105 mmol/L (ref 96–106)
Creatinine, Ser: 1.34 mg/dL — ABNORMAL HIGH (ref 0.76–1.27)
Glucose: 87 mg/dL (ref 65–99)
Potassium: 4.1 mmol/L (ref 3.5–5.2)
Sodium: 140 mmol/L (ref 134–144)
eGFR: 58 mL/min/{1.73_m2} — ABNORMAL LOW (ref >59)

## 2020-10-22 MED ORDER — FUROSEMIDE 20 MG PO TABS: 20.0000 mg | ORAL_TABLET | Freq: Every day | ORAL | 3 refills | Status: DC

## 2020-10-22 MED ORDER — AMLODIPINE BESYLATE 5 MG PO TABS: 5.0000 mg | ORAL_TABLET | Freq: Every day | ORAL | 3 refills | Status: DC

## 2020-10-22 NOTE — Patient Instructions (Addendum)
Medication Instructions:   START Amlodipine 5 mg daily  TAKE Lasix 40 mg (2 tablets) daily for 3 days, on day 4 resume 20 mg (1 tablet) daily  *If you need a refill on your cardiac medications before your next appointment, please call your pharmacy*  Lab Work: Your physician recommends that you return for lab work TODAY:   BMET  If you have labs (blood work) drawn today and your tests are completely normal, you will receive your results only by: Marland Kitchen MyChart Message (if you have MyChart) OR . A paper copy in the mail If you have any lab test that is abnormal or we need to change your treatment, we will call you to review the results.  Testing/Procedures: NONE ordered at this time of appointment   Follow-Up: At [facility], you and your health needs are our priority.  As part of our continuing mission to provide you with exceptional heart care, we have created designated Provider Care Teams.  These Care Teams include your primary Cardiologist (physician) and Advanced Practice Providers (APPs -  Physician Assistants and Nurse Practitioners) who all work together to provide you with the care you need, when you need it.  We recommend signing up for the patient portal called "MyChart".  Sign up information is provided on this After Visit Summary.  MyChart is used to connect with patients for Virtual Visits (Telemedicine).  Patients are able to view lab/test results, encounter notes, upcoming appointments, etc.  Non-urgent messages can be sent to your provider as well.   To learn more about what you can do with MyChart, go to [email or web address].    Your next appointment:   1 month(s)  6 month(s)  The format for your next appointment:   In Person In Person  Provider:   Pharm D Quay Burow, MD  Other Instructions  MONITOR Blood pressure (BP) at home 2 times a day. The 1st reading should be done 2 hours after taking morning medications and the 2nd BP reading should be taken at  a time of your choosing at the same time daily.  Bring BP log to your 1 month appointment with the Pharmacist in Hypertension clinic    If your BP is consistently greater than 130/80 please give our office a call.     Low-Sodium Eating Plan Sodium, which is an element that makes up salt, helps you maintain a healthy balance of fluids in your body. Too much sodium can increase your blood pressure and cause fluid and waste to be held in your body. Your health care provider or dietitian may recommend following this plan if you have high blood pressure (hypertension), kidney disease, liver disease, or heart failure. Eating less sodium can help lower your blood pressure, reduce swelling, and protect your heart, liver, and kidneys. What are tips for following this plan? Reading food labels  The Nutrition Facts label lists the amount of sodium in one serving of the food. If you eat more than one serving, you must multiply the listed amount of sodium by the number of servings.  Choose foods with less than 140 mg of sodium per serving.  Avoid foods with 300 mg of sodium or more per serving. Shopping  Look for lower-sodium products, often labeled as "low-sodium" or "no salt added."  Always check the sodium content, even if foods are labeled as "unsalted" or "no salt added."  Buy fresh foods. ? Avoid canned foods and pre-made or frozen meals. ? Avoid canned, cured, or processed  meats.  Buy breads that have less than 80 mg of sodium per slice.   Cooking  Eat more home-cooked food and less restaurant, buffet, and fast food.  Avoid adding salt when cooking. Use salt-free seasonings or herbs instead of table salt or sea salt. Check with your health care provider or pharmacist before using salt substitutes.  Cook with plant-based oils, such as canola, sunflower, or olive oil.   Meal planning  When eating at a restaurant, ask that your food be prepared with less salt or no salt, if possible.  Avoid dishes labeled as brined, pickled, cured, smoked, or made with soy sauce, miso, or teriyaki sauce.  Avoid foods that contain MSG (monosodium glutamate). MSG is sometimes added to [place] food, bouillon, and some canned foods.  Make meals that can be grilled, baked, poached, roasted, or steamed. These are generally made with less sodium. General information Most people on this plan should limit their sodium intake to 1,500-2,000 mg (milligrams) of sodium each day. What foods should I eat? Fruits Fresh, frozen, or canned fruit. Fruit juice. Vegetables Fresh or frozen vegetables. "No salt added" canned vegetables. "No salt added" tomato sauce and paste. Low-sodium or reduced-sodium tomato and vegetable juice. Grains Low-sodium cereals, including oats, puffed wheat and rice, and shredded wheat. Low-sodium crackers. Unsalted rice. Unsalted pasta. Low-sodium bread. Whole-grain breads and whole-grain pasta. Meats and other proteins Fresh or frozen (no salt added) meat, poultry, seafood, and fish. Low-sodium canned tuna and salmon. Unsalted nuts. Dried peas, beans, and lentils without added salt. Unsalted canned beans. Eggs. Unsalted nut butters. Dairy Milk. Soy milk. Cheese that is naturally low in sodium, such as ricotta cheese, fresh mozzarella, or Swiss cheese. Low-sodium or reduced-sodium cheese. Cream cheese. Yogurt. Seasonings and condiments Fresh and dried herbs and spices. Salt-free seasonings. Low-sodium mustard and ketchup. Sodium-free salad dressing. Sodium-free light mayonnaise. Fresh or refrigerated horseradish. Lemon juice. Vinegar. Other foods Homemade, reduced-sodium, or low-sodium soups. Unsalted popcorn and pretzels. Low-salt or salt-free chips. The items listed above may not be a complete list of foods and beverages you can eat. Contact a dietitian for more information. What foods should I avoid? Vegetables Sauerkraut, pickled vegetables, and relishes. Olives. [place] fries.  Onion rings. Regular canned vegetables (not low-sodium or reduced-sodium). Regular canned tomato sauce and paste (not low-sodium or reduced-sodium). Regular tomato and vegetable juice (not low-sodium or reduced-sodium). Frozen vegetables in sauces. Grains Instant hot cereals. Bread stuffing, pancake, and biscuit mixes. Croutons. Seasoned rice or pasta mixes. Noodle soup cups. Boxed or frozen macaroni and cheese. Regular salted crackers. Self-rising flour. Meats and other proteins Meat or fish that is salted, canned, smoked, spiced, or pickled. Precooked or cured meat, such as sausages or meat loaves. [place] Salines. Ham. Pepperoni. Hot dogs. Corned beef. Chipped beef. Salt pork. Jerky. Pickled herring. Anchovies and sardines. Regular canned tuna. Salted nuts. Dairy Processed cheese and cheese spreads. Hard cheeses. Cheese curds. Blue cheese. Feta cheese. String cheese. Regular cottage cheese. Buttermilk. Canned milk. Fats and oils Salted butter. Regular margarine. Ghee. Bacon fat. Seasonings and condiments Onion salt, garlic salt, seasoned salt, table salt, and sea salt. Canned and packaged gravies. Worcestershire sauce. Tartar sauce. Barbecue sauce. Teriyaki sauce. Soy sauce, including reduced-sodium. Steak sauce. Fish sauce. Oyster sauce. Cocktail sauce. Horseradish that you find on the shelf. Regular ketchup and mustard. Meat flavorings and tenderizers. Bouillon cubes. Hot sauce. Pre-made or packaged marinades. Pre-made or packaged taco seasonings. Relishes. Regular salad dressings. Salsa. Other foods Salted popcorn and pretzels. Corn chips and  puffs. Potato and tortilla chips. Canned or dried soups. Pizza. Frozen entrees and pot pies. The items listed above may not be a complete list of foods and beverages you should avoid. Contact a dietitian for more information. Summary  Eating less sodium can help lower your blood pressure, reduce swelling, and protect your heart, liver, and kidneys.  Most people on  this plan should limit their sodium intake to 1,500-2,000 mg (milligrams) of sodium each day.  Canned, boxed, and frozen foods are high in sodium. Restaurant foods, fast foods, and pizza are also very high in sodium. You also get sodium by adding salt to food.  Try to cook at home, eat more fresh fruits and vegetables, and eat less fast food and canned, processed, or prepared foods. This information is not intended to replace advice given to you by your health care provider. Make sure you discuss any questions you have with your health care provider. Document Revised: 08/19/2019 Document Reviewed: 06/15/2019 Elsevier Patient Education  2021 [employer].

## 2020-10-25 ENCOUNTER — Ambulatory Visit (INDEPENDENT_AMBULATORY_CARE_PROVIDER_SITE_OTHER): Payer: Medicare Other | Admitting: Gastroenterology

## 2020-10-25 ENCOUNTER — Encounter: Payer: Self-pay | Admitting: Gastroenterology

## 2020-10-25 ENCOUNTER — Encounter: Payer: Medicare Other | Admitting: Gastroenterology

## 2020-10-25 VITALS — BP 174/70 | HR 64 | Ht 70.0 in | Wt 163.0 lb

## 2020-10-25 DIAGNOSIS — R140 Abdominal distension (gaseous): Secondary | ICD-10-CM

## 2020-10-25 DIAGNOSIS — R1013 Epigastric pain: Secondary | ICD-10-CM

## 2020-10-25 DIAGNOSIS — R195 Other fecal abnormalities: Secondary | ICD-10-CM

## 2020-10-25 DIAGNOSIS — K5900 Constipation, unspecified: Secondary | ICD-10-CM | POA: Diagnosis not present

## 2020-10-25 DIAGNOSIS — K219 Gastro-esophageal reflux disease without esophagitis: Secondary | ICD-10-CM

## 2020-10-25 MED ORDER — POLYETHYLENE GLYCOL 3350 17 G PO PACK: 17.0000 g | PACK | Freq: Two times a day (BID) | ORAL | 0 refills | Status: DC

## 2020-10-25 NOTE — Progress Notes (Signed)
HPI :  70 year old male here for follow-up visit for abdominal pain and constipation.  He is known to me for history of colon cancer which was diagnosed in November 2020, he is status post surgery and chemotherapy and thought to be in remission currently.  He states for the past 3 months his stomach is bothering him.  He complains of epigastric discomfort that can come and go.  He feels a discomfort in his epigastric area, associated with pyrosis and regurgitation.  He states it actually worse when he is not eating, eating can help settle his stomach at times.  He does have some nausea and previously had a few episodes of vomiting but has not had vomiting recently.  He has had some constipation during this time.  He is having a bowel movement about once every 3 days.  Associated with this he has some abdominal bloating.  Sometimes he does pass a hard stool and in the setting will have scant amount of red blood.  He denies any dark or black stool.  His primary care did Hemoccult testing which was positive.  The patient had a colonoscopy with me on October 29 of last year which showed no polyps and internal hemorrhoids.  He is currently not taking anything for constipation. He had a negative H. pylori serology test with his primary care.  His hemoglobin was stable at 13.4.  He was given a trial of omeprazole 40 mg once daily and states this has provided some benefit and things have improved but not yet resolved.  He denies any NSAID use, uses only Tylenol as needed.  He has never had a prior upper endoscopy.  He had surveillance CT scan in light of his colon cancer back in November which did not show any concerning lesions.  He recently had an echocardiogram which shows normal ejection fraction as outlined below.  Cardiac cath also done in 2020 without any significant CAD.   Labs 08/16/20 Hgb 13.4, plt 211, WBC 6.4, MCV 87 H pylori IgG negative Fecal occult blood (+)    Colonoscopy 05/25/2020 - The  perianal and digital rectal examinations were normal. - The terminal ileum appeared normal. - There was evidence of a prior end-to-end colo-colonic anastomosis in the transverse colon. This was patent and was characterized by healthy appearing mucosa. A portion of a buried staple was visualized at the anastomosis. - Internal hemorrhoids were found during retroflexion. The hemorrhoids were small. - The exam was otherwise without abnormality. No polyps.  Repeat colonoscopy in 3 years   Colonoscopy 06/27/2019 - Preparation of the colon was poor, exam inadequate for ensuring no other polyps noted, however none visualized. - Likely malignant partially obstructing tumor in the distal transverse colon, could not traverse it due to luminal narrowing. Biopsied. - Internal hemorrhoids.   Echo 09/20/20 - EF 55-60%, grade I DD   CT chest / abdomen / pelvis 06/11/20 - IMPRESSION: 1. Surgical changes within the transverse colon, without recurrent or metastatic disease. 2. New trace right pelvic fluid, nonspecific. 3. New mild left upper lobe interstitial/ground-glass opacity, suspicious for minimal infection or inflammation. Correlate with infectious symptoms. 4. Aortic Atherosclerosis (ICD10-I70.0).  Past Medical History:  Diagnosis Date  . Alcoholism ([place])   . Allergy   . Arthritis    hips, L shoulder  . Asthma   . Cancer ([place]) 05/2019   colon cancer  . CHF (congestive heart failure) ([place])   . Chronic kidney disease    only has one kidney  .  COPD (chronic obstructive pulmonary disease) ([place])   . COVID-19   . Depression    pt denies  . Dyspnea   . Dysrhythmia    PVC's  . Family history of anesthesia complication   . GERD (gastroesophageal reflux disease)   . Heart murmur   . Hyperlipidemia   . Hypertension   . Myocardial infarction [facility])    pt is unsure of date- believes early 2020  . Osteoporosis   . Pneumonia      Past Surgical History:  Procedure Laterality Date  .  BIOPSY  06/27/2019   Procedure: BIOPSY;  Surgeon: Yetta Flock, MD;  Location: [facility] ENDOSCOPY;  Service: Gastroenterology;;  . CATARACT EXTRACTION    . COLONOSCOPY Left 06/27/2019   Procedure: COLONOSCOPY;  Surgeon: Yetta Flock, MD;  Location: [facility] ENDOSCOPY;  Service: Gastroenterology;  Laterality: Left;  . COLONOSCOPY    . LAPAROTOMY N/A 06/30/2019   Procedure: EXPLORATORY LAPAROTOMY;  Surgeon: Ralene Ok, MD;  Location: [place];  Service: General;  Laterality: N/A;  . LEFT HEART CATH AND CORONARY ANGIOGRAPHY N/A 10/04/2018   Procedure: LEFT HEART CATH AND CORONARY ANGIOGRAPHY;  Surgeon: Jettie Booze, MD;  Location: [place] CV LAB;  Service: Cardiovascular;  Laterality: N/A;  . PARTIAL COLECTOMY N/A 06/30/2019   Procedure: PARTIAL COLECTOMY;  Surgeon: Ralene Ok, MD;  Location: [place];  Service: General;  Laterality: N/A;  . PORTACATH PLACEMENT Left 08/02/2019   Procedure: INSERTION PORT-A-CATH LEFT CHEST;  Surgeon: Ralene Ok, MD;  Location: [place];  Service: General;  Laterality: Left;  . RIGHT/LEFT HEART CATH AND CORONARY ANGIOGRAPHY N/A 06/12/2017   Procedure: RIGHT/LEFT HEART CATH AND CORONARY ANGIOGRAPHY;  Surgeon: Jolaine Artist, MD;  Location: [place] CV LAB;  Service: Cardiovascular;  Laterality: N/A;   Family History  Problem Relation Age of Onset  . Heart disease Father   . Heart disease Daughter        "fluid around heart"   . [place] disease Daughter   . Cancer Daughter        unsure of type  . Asthma Son   . Heart failure Mother   . Kidney failure Mother   . Colon cancer Neg Hx   . Esophageal cancer Neg Hx   . Rectal cancer Neg Hx   . Stomach cancer Neg Hx    Social History   Tobacco Use  . Smoking status: Former Smoker    Packs/day: 0.10    Years: 45.00    Pack years: 4.50    Quit date: 2012    Years since quitting: 10.2  . Smokeless tobacco: Never Used  Vaping Use  . Vaping Use: Never used  Substance Use Topics   . Alcohol use: No    Comment: Pt states no etoh since April 2014  . Drug use: Not Currently    Comment: Prior use of crack cocaine, quit 2012   Current Outpatient Medications  Medication Sig Dispense Refill  . albuterol (VENTOLIN HFA) 108 (90 Base) MCG/ACT inhaler Inhale 2 puffs into the lungs every 4 (four) hours as needed for wheezing or shortness of breath. 18 g 0  . amiodarone (PACERONE) 100 MG tablet Take 1 tablet (100 mg total) by mouth daily. 30 tablet 3  . amLODipine (NORVASC) 5 MG tablet Take 1 tablet (5 mg total) by mouth daily. 180 tablet 3  . budesonide-formoterol (SYMBICORT) 160-4.5 MCG/ACT inhaler Inhale 2 puffs into the lungs 2 (two) times daily. 1 Inhaler 11  . carvedilol (  COREG) 3.125 MG tablet TAKE 1 TABLET BY MOUTH TWICE DAILY WITH A MEAL. 60 tablet 5  . clarithromycin (BIAXIN XL) 500 MG 24 hr tablet Take 2 tablets (1,000 mg total) by mouth daily. 7 tablet 0  . famotidine (PEPCID) 20 MG tablet Take 40 mg by mouth 2 (two) times daily.    . fluticasone (FLONASE) 50 MCG/ACT nasal spray Place 2 sprays into both nostrils daily as needed for allergies or rhinitis.    . furosemide (LASIX) 20 MG tablet Take 1 tablet (20 mg total) by mouth daily. 90 tablet 3  . isosorbide-hydrALAZINE (BIDIL) 20-37.5 MG tablet Take 1 tablet by mouth 3 (three) times daily.    Marland Kitchen losartan (COZAAR) 50 MG tablet Take 1 tablet (50 mg total) by mouth daily. 90 tablet 3  . montelukast (SINGULAIR) 10 MG tablet Take 10 mg by mouth at bedtime.    Marland Kitchen omeprazole (PRILOSEC) 40 MG capsule Take 40 mg by mouth daily. For 8 weeks     No current facility-administered medications for this visit.   Allergies  Allergen Reactions  . Clarithromycin Shortness Of Breath  . Lisinopril Swelling    Facial and tongue swelling 10/2012     Review of Systems: All systems reviewed and negative except where noted in HPI.   Lab Results  Component Value Date   WBC 5.7 06/07/2020   HGB 12.1 (L) 06/07/2020   HCT 37.1 (L)  06/07/2020   MCV 88.5 06/07/2020   PLT 205 06/07/2020    Lab Results  Component Value Date   CREATININE 1.34 (H) 10/22/2020   BUN 11 10/22/2020   NA 140 10/22/2020   K 4.1 10/22/2020   CL 105 10/22/2020   CO2 21 10/22/2020    Lab Results  Component Value Date   ALT 20 09/10/2020   AST 16 09/10/2020   ALKPHOS 78 09/10/2020   BILITOT 0.9 09/10/2020   More recent labs per HPI, from PCP's office  Physical Exam: BP (!) 174/70 (BP Location: Left Arm, Patient Position: Sitting, Cuff Size: Normal)   Pulse 64   Ht 5\' 10"  (1.778 m) Comment: height measured without shoes  Wt 163 lb (73.9 kg)   BMI 23.39 kg/m  Constitutional: Pleasant,well-developed, male in no acute distress. HEENT: Normocephalic and atraumatic. Conjunctivae are normal. No scleral icterus. Neck supple.  Cardiovascular: Normal rate, regular rhythm.  Pulmonary/chest: Effort normal and breath sounds normal.  Abdominal: Soft, nondistended, nontender. There are no masses palpable.  Extremities: no edema Lymphadenopathy: No cervical adenopathy noted. Neurological: Alert and oriented to person place and time. Skin: Skin is warm and dry. No rashes noted. Psychiatric: Normal mood and affect. Behavior is normal.   ASSESSMENT AND PLAN: 70 year old male here for reassessment of the following:  Epigastric pain GERD Heme positive stool Constipation Bloating  3 months worth of intermittent epigastric discomfort along with worsening pyrosis and regurgitation.  H. pylori serology negative.  He is also having worsening constipation with scant rectal bleeding which is likely due to hemorrhoids, although stool is heme positive on a random stool check.  Hemoglobin normal.  Discussed the situation with him.  His colonoscopy is up-to-date from just a few months ago and looked normal, suspect his rectal bleeding is due to hemorrhoids noted on that exam.  Recommend treating constipation with MiraLAX twice daily to start he can  titrate that up or down as needed.  In regards to his upper tract symptoms, I am recommending an upper endoscopy to further evaluate this.  I  discussed risks and benefits of endoscopy and anesthesia with him and he wants to proceed.  In the interim we will increase his omeprazole empirically to 40 mg twice daily and see if that provides any additional benefit while we are waiting to do the EGD.  He should continue to avoid NSAIDs.   Plan: - schedule for upper endoscopy - increase omeprazole to 40 mg twice daily until endoscopy - treat constipation with MiraLAX twice daily, titrate up or down as needed  Further recommendations pending the results of his exam and his course  Pattison Cellar, MD [facility]

## 2020-10-25 NOTE — Patient Instructions (Addendum)
If you are age 67 or older, your body mass index should be between 23-30. Your Body mass index is 23.39 kg/m. If this is out of the aforementioned range listed, please consider follow up with your Primary Care Provider.   You have been scheduled for an endoscopy. Please follow written instructions given to you at your visit today. If you use inhalers (even only as needed), please bring them with you on the day of your procedure.  Increase your omeprazole to twice a day.    Please purchase the following medications over the counter and take as directed: Miralax: Take twice a day as directed.  Thank you for entrusting me with your care and for choosing [facility], Dr. Langlade Cellar

## 2020-10-31 ENCOUNTER — Other Ambulatory Visit: Payer: Self-pay

## 2020-10-31 ENCOUNTER — Ambulatory Visit (AMBULATORY_SURGERY_CENTER): Payer: Medicare Other | Admitting: Gastroenterology

## 2020-10-31 ENCOUNTER — Encounter: Payer: Self-pay | Admitting: Gastroenterology

## 2020-10-31 VITALS — BP 111/57 | HR 65 | Temp 96.9°F | Resp 21 | Ht 70.0 in | Wt 163.0 lb

## 2020-10-31 DIAGNOSIS — K219 Gastro-esophageal reflux disease without esophagitis: Secondary | ICD-10-CM | POA: Diagnosis not present

## 2020-10-31 DIAGNOSIS — R1013 Epigastric pain: Secondary | ICD-10-CM

## 2020-10-31 DIAGNOSIS — K449 Diaphragmatic hernia without obstruction or gangrene: Secondary | ICD-10-CM | POA: Diagnosis not present

## 2020-10-31 MED ORDER — SODIUM CHLORIDE 0.9 % IV SOLN: 500.0000 mL | Freq: Once | INTRAVENOUS | Status: DC

## 2020-10-31 MED ORDER — METOCLOPRAMIDE HCL 5 MG PO TABS: ORAL_TABLET | ORAL | 0 refills | Status: DC

## 2020-10-31 NOTE — Op Note (Signed)
[facility] Patient Name: Joe Garcia Procedure Date: 10/31/2020 10:05 AM MRN: [id number] Endoscopist: Remo Lipps P. Havery Moros , MD Age: 70 Referring MD:  Date of Birth: 03-24-1951 Gender: Male Account #: [id number] Procedure:                Upper GI endoscopy Indications:              Epigastric abdominal pain, Follow-up of                            gastro-esophageal reflux disease - recently                            increased omeprazole to 40mg  twice daily with some                            improvement Medicines:                Monitored Anesthesia Care Procedure:                Pre-Anesthesia Assessment:                           - Prior to the procedure, a History and Physical                            was performed, and patient medications and                            allergies were reviewed. The patient's tolerance of                            previous anesthesia was also reviewed. The risks                            and benefits of the procedure and the sedation                            options and risks were discussed with the patient.                            All questions were answered, and informed consent                            was obtained. Prior Anticoagulants: The patient has                            taken no previous anticoagulant or antiplatelet                            agents. ASA Grade Assessment: III - A patient with                            severe systemic disease. After reviewing the risks  and benefits, the patient was deemed in                            satisfactory condition to undergo the procedure.                           After obtaining informed consent, the endoscope was                            passed under direct vision. Throughout the                            procedure, the patient's blood pressure, pulse, and                            oxygen saturations were monitored continuously. The                             Endoscope was introduced through the mouth, and                            advanced to the antrum of the stomach. The upper GI                            endoscopy was accomplished without difficulty. The                            patient tolerated the procedure well. Scope In: Scope Out: Findings:                 A small hiatal hernia was present.                           The Z-line was regular but there appeared to be a                            short segment of circumferential salmon colored                            mucosa above the GEJ, concerning for possible short                            segment Barrett's. Biopsies not taken given                            significant food burden in the stomach.                           The exam of the esophagus was otherwise normal.                           A large amount of food was found in the gastric  fundus, in the gastric body and in the gastric                            antrum. This prohibited full exam of the stomach                            and given risk for aspiration the procedure was                            aborted quickly once this was observed. Complications:            No immediate complications. Estimated blood loss:                            None. Estimated Blood Loss:     Estimated blood loss: none. Impression:               - Small hiatal hernia.                           - Possible short segment Barrett's, biopsies not                            obtained as above.                           - A large amount of food (residue) in the stomach.                            Procedure aborted as above. Recommendation:           - Patient has a contact number available for                            emergencies. The signs and symptoms of potential                            delayed complications were discussed with the                            patient. Return to  normal activities tomorrow.                            Written discharge instructions were provided to the                            patient.                           - Resume previous diet.                           - Continue present medications.                           - Continue omeprazole 40mg  twice daily as that has  provided benefit                           - Repeat upper endoscopy to re-evaluate this issue.                            Ensure no solid food prior to the exam, clear                            liquid diet night prior, with dose of Reglan in AM                            a few hours prior to the exam Remo Lipps P. Giordano Getman, MD 10/31/2020 10:26:06 AM This report has been signed electronically.

## 2020-10-31 NOTE — Progress Notes (Signed)
Vs by n Campbell,LPN in adm

## 2020-10-31 NOTE — Progress Notes (Signed)
Report to PACU, RN, vss, BBS= Clear.  

## 2020-10-31 NOTE — Patient Instructions (Signed)
YOU HAD AN ENDOSCOPIC PROCEDURE TODAY AT [facility]:   Refer to the procedure report that was given to you for any specific questions about what was found during the examination.  If the procedure report does not answer your questions, please call your gastroenterologist to clarify.  If you requested that your care partner not be given the details of your procedure findings, then the procedure report has been included in a sealed envelope for you to review at your convenience later.  YOU SHOULD EXPECT: Some feelings of bloating in the abdomen. Passage of more gas than usual.  Walking can help get rid of the air that was put into your GI tract during the procedure and reduce the bloating. If you had a lower endoscopy (such as a colonoscopy or flexible sigmoidoscopy) you may notice spotting of blood in your stool or on the toilet paper. If you underwent a bowel prep for your procedure, you may not have a normal bowel movement for a few days.  Please Note:  You might notice some irritation and congestion in your nose or some drainage.  This is from the oxygen used during your procedure.  There is no need for concern and it should clear up in a day or so.  SYMPTOMS TO REPORT IMMEDIATELY:     Following upper endoscopy (EGD)  Vomiting of blood or coffee ground material  New chest pain or pain under the shoulder blades  Painful or persistently difficult swallowing  New shortness of breath  Fever of 100F or higher  Black, tarry-looking stools  For urgent or emergent issues, a gastroenterologist can be reached at any hour by calling [phone]. Do not use MyChart messaging for urgent concerns.    DIET:  We do recommend a small meal at first, but then you may proceed to your regular diet.  Drink plenty of fluids but you should avoid alcoholic beverages for 24 hours.  ACTIVITY:  You should plan to take it easy for the rest of today and you should NOT DRIVE or use heavy machinery  until tomorrow (because of the sedation medicines used during the test).    FOLLOW UP: Our staff will call the number listed on your records 48-72 hours following your procedure to check on you and address any questions or concerns that you may have regarding the information given to you following your procedure. If we do not reach you, we will leave a message.  We will attempt to reach you two times.  During this call, we will ask if you have developed any symptoms of COVID 19. If you develop any symptoms (ie: fever, flu-like symptoms, shortness of breath, cough etc.) before then, please call [phone].  If you test positive for Covid 19 in the 2 weeks post procedure, please call and report this information to [place].    If any biopsies were taken you will be contacted by phone or by letter within the next 1-3 weeks.  Please call us at [phone] if you have not heard about the biopsies in 3 weeks.    SIGNATURES/CONFIDENTIALITY: You and/or your care partner have signed paperwork which will be entered into your electronic medical record.  These signatures attest to the fact that that the information above on your After Visit Summary has been reviewed and is understood.  Full responsibility of the confidentiality of this discharge information lies with you and/or your care-partner.  Resume medications.

## 2020-10-31 NOTE — Progress Notes (Signed)
Verbal order received for patient to take 10mg  3 hours prior to procedure. Sent rx into pharmacy on file day of procedure.

## 2020-11-02 ENCOUNTER — Telehealth: Payer: Self-pay | Admitting: *Deleted

## 2020-11-02 NOTE — Telephone Encounter (Signed)
Follow up call made. No voice mailbox set up

## 2020-11-22 ENCOUNTER — Other Ambulatory Visit: Payer: Self-pay

## 2020-11-22 ENCOUNTER — Ambulatory Visit (INDEPENDENT_AMBULATORY_CARE_PROVIDER_SITE_OTHER): Payer: Medicare Other | Admitting: Pharmacist

## 2020-11-22 VITALS — BP 148/86 | HR 62 | Resp 17 | Ht 71.0 in | Wt 164.0 lb

## 2020-11-22 DIAGNOSIS — I5022 Chronic systolic (congestive) heart failure: Secondary | ICD-10-CM

## 2020-11-22 DIAGNOSIS — I10 Essential (primary) hypertension: Secondary | ICD-10-CM | POA: Diagnosis not present

## 2020-11-22 MED ORDER — LOSARTAN POTASSIUM 50 MG PO TABS: 50.0000 mg | ORAL_TABLET | Freq: Every day | ORAL | 3 refills | Status: DC

## 2020-11-22 MED ORDER — ISOSORB DINITRATE-HYDRALAZINE 20-37.5 MG PO TABS: 1.0000 | ORAL_TABLET | Freq: Three times a day (TID) | ORAL | 1 refills | Status: DC

## 2020-11-22 MED ORDER — CARVEDILOL 3.125 MG PO TABS: 3.1250 mg | ORAL_TABLET | Freq: Two times a day (BID) | ORAL | 5 refills | Status: DC

## 2020-11-22 MED ORDER — AMLODIPINE BESYLATE 5 MG PO TABS: 5.0000 mg | ORAL_TABLET | Freq: Every evening | ORAL | 3 refills | Status: DC

## 2020-11-22 MED ORDER — FUROSEMIDE 20 MG PO TABS: 20.0000 mg | ORAL_TABLET | Freq: Every day | ORAL | 3 refills | Status: DC

## 2020-11-22 NOTE — Patient Instructions (Addendum)
Return for a  follow up appointment in 4 weeks  Check your blood pressure at home daily (if able) and keep record of the readings.  Take your BP meds as follows: *CHANGE AMLODIPINE 5MG  EVERY EVENING DOSE* *KEEP ALL OTHER MEDICATION UNCHANGED UNTIL NEXT PRE-PACKED PICK UP*  Bring all of your meds, your BP cuff and your record of home blood pressures to your next appointment.  Exercise as you're able, try to walk approximately 30 minutes per day.  Keep salt intake to a minimum, especially watch canned and prepared boxed foods.  Eat more fresh fruits and vegetables and fewer canned items.  Avoid eating in fast food restaurants.    HOW TO TAKE YOUR BLOOD PRESSURE: . Rest 5 minutes before taking your blood pressure. .  Don't smoke or drink caffeinated beverages for at least 30 minutes before. . Take your blood pressure before (not after) you eat. . Sit comfortably with your back supported and both feet on the floor (don't cross your legs). . Elevate your arm to heart level on a table or a desk. . Use the proper sized cuff. It should fit smoothly and snugly around your bare upper arm. There should be enough room to slip a fingertip under the cuff. The bottom edge of the cuff should be 1 inch above the crease of the elbow. . Ideally, take 3 measurements at one sitting and record the average.

## 2020-11-22 NOTE — Progress Notes (Signed)
Patient ID: Joe Garcia                 DOB: February 16, 1951                      MRN: [id number]     HPI: Joe Garcia is a 70 y.o. male patient of Dr Joe Garcia referred by Joe Lofts PA  to HTN clinic. PMH includes asthma, HF, CKD, COPD, hypertension, hyperlipidemia, HF, and depression. He reports frequent dizziness, and lightheaded but deniesno falls. No ARNI d/t history of angioedema with lisinopril.  Patient has very little knowledge of his medication. Get pre-pack medication from his pharmacy and identifies tablets by color. Today he is requesting his "orange pill" but name and done unknown.   Current HTN meds:  Amlodipine 5mg  daily  BiDil Carvedilol 3.125mg  twice daily - (7am - 6pm) Furosemide 20mg  daily Losartan 50mg  daily  Previously tried:  Lisinopril - swelling  BP goal: 130/80  Family History: family history includes Asthma in his son; Canavan disease in his daughter; Cancer in his daughter; Heart disease in his daughter and father; Heart failure in his mother; Kidney failure in his mother  Social History:  quit smoking about 10 years ago. He has a 4.50 pack-year smoking history. He has never used smokeless tobacco. He reports previous drug use. He reports that he does not drink alcohol.   Exercise: activities of daily living  Home BP readings: none provided  Wt Readings from Last 3 Encounters:  11/22/20 164 lb (74.4 kg)  10/31/20 163 lb (73.9 kg)  10/25/20 163 lb (73.9 kg)   BP Readings from Last 3 Encounters:  11/22/20 (!) 148/86  10/31/20 (!) 111/57  10/25/20 (!) 174/70   Pulse Readings from Last 3 Encounters:  11/22/20 62  10/31/20 65  10/25/20 64    Past Medical History:  Diagnosis Date  . Alcoholism ([place])   . Allergy   . Arthritis    hips, L shoulder  . Asthma   . Cancer ([place]) 05/2019   colon cancer  . CHF (congestive heart failure) ([place])   . Chronic kidney disease    only has one kidney  . COPD (chronic obstructive pulmonary disease)  ([place])   . COVID-19   . Depression    pt denies  . Dyspnea   . Dysrhythmia    PVC's  . Family history of anesthesia complication   . GERD (gastroesophageal reflux disease)   . Heart murmur   . Hyperlipidemia   . Hypertension   . Myocardial infarction [facility])    pt is unsure of date- believes early 2020  . Osteoporosis   . Pneumonia   . Substance abuse ([place])    alcoholism over 10 yrs ago    Current Outpatient Medications on File Prior to Visit  Medication Sig Dispense Refill  . albuterol (VENTOLIN HFA) 108 (90 Base) MCG/ACT inhaler Inhale 2 puffs into the lungs every 4 (four) hours as needed for wheezing or shortness of breath. 18 g 0  . amiodarone (PACERONE) 100 MG tablet Take 1 tablet (100 mg total) by mouth daily. 30 tablet 3  . budesonide-formoterol (SYMBICORT) 160-4.5 MCG/ACT inhaler Inhale 2 puffs into the lungs 2 (two) times daily. 1 Inhaler 11  . famotidine (PEPCID) 20 MG tablet Take 20 mg by mouth daily.    . fluticasone (FLONASE) 50 MCG/ACT nasal spray Place 2 sprays into both nostrils daily as needed for allergies or rhinitis.    Marland Kitchen metoCLOPramide (  REGLAN) 5 MG tablet Take 10mg  3 hours prior to procedure on June 1st. 2 tablet 0  . montelukast (SINGULAIR) 10 MG tablet Take 10 mg by mouth at bedtime.    Marland Kitchen omeprazole (PRILOSEC) 40 MG capsule Take 40 mg by mouth in the morning and at bedtime.    . polyethylene glycol (MIRALAX) 17 g packet Take 17 g by mouth 2 (two) times daily. 14 each 0   No current facility-administered medications on file prior to visit.    Allergies  Allergen Reactions  . Clarithromycin Shortness Of Breath  . Lisinopril Swelling    Facial and tongue swelling 10/2012    Blood pressure (!) 148/86, pulse 62, resp. rate 17, height 5\' 11"  (1.803 m), weight 164 lb (74.4 kg), SpO2 99 %.  Chronic systolic heart failure (HCC) Blood pressure is above goal today, but patient unable to provide accurate information about his medication. I called his pharmacy and  discovered his BiDil was never filled, spironolactone last dispensed 2 months ago, and carvedilol recently refilled.  Send new Rx for BiDil, explained patient that his "orange pill" was spironolactone and it was discontinued by his doctor. Refill for all other medication was sent as well and discussed with pharmacist at [facility]. Alos changed his amlodipine dose to evening dosing with next bubble pack.  Will follow up in 4 weeks and titrate medication as needed.   Joe Garcia PharmD, BCPS, CPP [place] [id number] 11/28/2020 1:29 PM

## 2020-11-28 NOTE — Assessment & Plan Note (Signed)
Blood pressure is above goal today, but patient unable to provide accurate information about his medication. I called his pharmacy and discovered his BiDil was never filled, spironolactone last dispensed 2 months ago, and carvedilol recently refilled.  Send new Rx for BiDil, explained patient that his "orange pill" was spironolactone and it was discontinued by his doctor. Refill for all other medication was sent as well and discussed with pharmacist at [facility]. Alos changed his amlodipine dose to evening dosing with next bubble pack.  Will follow up in 4 weeks and titrate medication as needed.

## 2020-12-04 ENCOUNTER — Inpatient Hospital Stay: Payer: Medicare Other

## 2020-12-04 ENCOUNTER — Inpatient Hospital Stay: Payer: Medicare Other | Attending: Nurse Practitioner | Admitting: Nurse Practitioner

## 2020-12-05 ENCOUNTER — Encounter: Payer: Self-pay | Admitting: *Deleted

## 2020-12-05 NOTE — Progress Notes (Signed)
Was "no show" for his follow-up on 5/10. Noted he is scheduled for colonoscopy on 12/26/20. Scheduling message sent to reschedule for week of 6/13.

## 2020-12-11 ENCOUNTER — Ambulatory Visit (AMBULATORY_SURGERY_CENTER): Payer: Self-pay | Admitting: *Deleted

## 2020-12-11 ENCOUNTER — Other Ambulatory Visit: Payer: Self-pay

## 2020-12-11 DIAGNOSIS — R1013 Epigastric pain: Secondary | ICD-10-CM

## 2020-12-11 DIAGNOSIS — K219 Gastro-esophageal reflux disease without esophagitis: Secondary | ICD-10-CM

## 2020-12-11 MED ORDER — METOCLOPRAMIDE HCL 5 MG PO TABS: ORAL_TABLET | ORAL | 0 refills | Status: DC

## 2020-12-11 NOTE — Progress Notes (Signed)
Patient is here in-person for PV.  Patient denies any changes to medical, surgical, family history or medications from last GI visit in April 2022.   Patient denies any allergies to eggs or soy. Patient denies any problems with anesthesia/sedation. Patient denies any oxygen use at home. Patient denies taking any diet/weight loss medications or blood thinners. Patient is not being treated for MRSA or C-diff. Patient is aware of our care-partner policy and PETKK-44 safety protocol.  Patient is COVID-19 vaccinated, per patient.    Reglan 5 mg - #2 tabs reordered sent to [facility].

## 2020-12-14 ENCOUNTER — Ambulatory Visit: Payer: Medicare Other

## 2020-12-20 ENCOUNTER — Ambulatory Visit: Payer: Medicare Other

## 2020-12-26 ENCOUNTER — Encounter: Payer: Medicare Other | Admitting: Gastroenterology

## 2020-12-26 ENCOUNTER — Telehealth: Payer: Self-pay | Admitting: Gastroenterology

## 2020-12-26 NOTE — Telephone Encounter (Signed)
Okay thanks for letting me know, this patient should call back to reschedule when he can

## 2020-12-26 NOTE — Telephone Encounter (Signed)
Good morning,  Patient calling to cancel EGD appt today at 10:00AM with Dr. Havery Moros.  Patient states he is not feeling well. Patient states he will call back to reschedule when he's feeling better.  Thank you

## 2021-01-01 ENCOUNTER — Ambulatory Visit (INDEPENDENT_AMBULATORY_CARE_PROVIDER_SITE_OTHER): Payer: Medicare Other | Admitting: Pharmacist Clinician (PhC)/ Clinical Pharmacy Specialist

## 2021-01-01 ENCOUNTER — Other Ambulatory Visit: Payer: Self-pay

## 2021-01-01 DIAGNOSIS — I10 Essential (primary) hypertension: Secondary | ICD-10-CM

## 2021-01-01 NOTE — Patient Instructions (Signed)
Return for a a follow up appointment August 9 at 10:30 am  Check your blood pressure at home daily and keep record of the readings.  Take your BP meds as follows:  Continue with all current medications  Bring all of your meds, your BP cuff and your record of home blood pressures to your next appointment.  Exercise as you're able, try to walk approximately 30 minutes per day.  Keep salt intake to a minimum, especially watch canned and prepared boxed foods.  Eat more fresh fruits and vegetables and fewer canned items.  Avoid eating in fast food restaurants.    HOW TO TAKE YOUR BLOOD PRESSURE: . Rest 5 minutes before taking your blood pressure. .  Don't smoke or drink caffeinated beverages for at least 30 minutes before. . Take your blood pressure before (not after) you eat. . Sit comfortably with your back supported and both feet on the floor (don't cross your legs). . Elevate your arm to heart level on a table or a desk. . Use the proper sized cuff. It should fit smoothly and snugly around your bare upper arm. There should be enough room to slip a fingertip under the cuff. The bottom edge of the cuff should be 1 inch above the crease of the elbow. . Ideally, take 3 measurements at one sitting and record the average.

## 2021-01-01 NOTE — Progress Notes (Signed)
Patient ID: Joe Garcia                 DOB: 1951-01-20                      MRN: [id number]     HPI: Joe Garcia is a 70 y.o. male patient of Dr Gwenlyn Found referred by Roby Lofts PA  to HTN clinic. PMH includes asthma, HF, CKD, COPD, hypertension, hyperlipidemia, HF, and depression. He reports frequent dizziness, and lightheaded but denies falls. No ARNI d/t history of angioedema with lisinopril.  He does not know his medications by name but rather associates them by color.  His pharmacy was called to verify medications and fill dates.  It was noted that he had never filled his BiDil and spironolactone was last filled 2 months ago. At his last visit his pressure was noted to be 148/86.  BiDil was refilled and information was reviewed with [facility].  Patient getting medications in bubble packs.   He returns today for follow up.  Does not have any readings with him, but states they have all been good at home.  He is wondering if he can stop the one medication that can "damage the kidney", as he only has one functioning kidney.  I assume that he is talking about the losartan and we discusses the benefits of this medication for him.   States that he does like getting his medications in the bubble packs and has been compliant.  Has no side effect issues at this time.    Current HTN meds:  Amlodipine 5mg  daily  BiDil tid Carvedilol 3.125mg  twice daily - (7am - 6pm) Furosemide 20mg  daily Losartan 50mg  daily  Previously tried:  Lisinopril - swelling  BP goal: 130/80  Family History: family history includes Asthma in his son; Canavan disease in his daughter; Cancer in his daughter; Heart disease in his daughter and father; Heart failure in his mother; Kidney failure in his mother  Social History:  quit smoking about 10 years ago. He has a 4.50 pack-year smoking history. He has never used smokeless tobacco. He reports previous drug use. He reports that he does not drink alcohol.   Occasional 7-up or ginger ale, nothing with caffeine  Exercise: activities of daily living  Home BP readings: none provided 123/69 this morning at home.    Wt Readings from Last 3 Encounters:  01/01/21 160 lb 9.6 oz (72.8 kg)  12/11/20 160 lb (72.6 kg)  11/22/20 164 lb (74.4 kg)   BP Readings from Last 3 Encounters:  01/01/21 132/84  11/22/20 (!) 148/86  10/31/20 (!) 111/57   Pulse Readings from Last 3 Encounters:  01/01/21 (!) 50  11/22/20 62  10/31/20 65    Past Medical History:  Diagnosis Date  . Alcoholism ([place])   . Allergy   . Arthritis    hips, L shoulder  . Asthma   . Cancer ([place]) 05/2019   colon cancer  . CHF (congestive heart failure) ([place])   . Chronic kidney disease    only has one kidney  . COPD (chronic obstructive pulmonary disease) ([place])   . COVID-19   . Depression    pt denies  . Dyspnea   . Dysrhythmia    PVC's  . Family history of anesthesia complication   . GERD (gastroesophageal reflux disease)   . Heart murmur   . Hyperlipidemia   . Hypertension   . Myocardial infarction ([place])  pt is unsure of date- believes early 2020  . Osteoporosis   . Pneumonia   . Substance abuse ([place])    alcoholism over 10 yrs ago    Current Outpatient Medications on File Prior to Visit  Medication Sig Dispense Refill  . albuterol (VENTOLIN HFA) 108 (90 Base) MCG/ACT inhaler Inhale 2 puffs into the lungs every 4 (four) hours as needed for wheezing or shortness of breath. 18 g 0  . amiodarone (PACERONE) 100 MG tablet Take 1 tablet (100 mg total) by mouth daily. 30 tablet 3  . amLODipine (NORVASC) 5 MG tablet Take 1 tablet (5 mg total) by mouth every evening. 90 tablet 3  . budesonide-formoterol (SYMBICORT) 160-4.5 MCG/ACT inhaler Inhale 2 puffs into the lungs 2 (two) times daily. 1 Inhaler 11  . carvedilol (COREG) 3.125 MG tablet Take 1 tablet (3.125 mg total) by mouth 2 (two) times daily with a meal. 180 tablet 5  . famotidine (PEPCID) 20 MG tablet Take 20 mg  by mouth daily.    . fluticasone (FLONASE) 50 MCG/ACT nasal spray Place 2 sprays into both nostrils daily as needed for allergies or rhinitis.    . furosemide (LASIX) 20 MG tablet Take 1 tablet (20 mg total) by mouth daily. 90 tablet 3  . isosorbide-hydrALAZINE (BIDIL) 20-37.5 MG tablet Take 1 tablet by mouth 3 (three) times daily. 270 tablet 1  . losartan (COZAAR) 50 MG tablet Take 1 tablet (50 mg total) by mouth daily. 90 tablet 3  . montelukast (SINGULAIR) 10 MG tablet Take 10 mg by mouth at bedtime.    Marland Kitchen omeprazole (PRILOSEC) 40 MG capsule Take 40 mg by mouth in the morning and at bedtime.    . metoCLOPramide (REGLAN) 5 MG tablet Take 10mg  3 hours prior to procedure on June 1st. (Patient not taking: Reported on 01/01/2021) 2 tablet 0   No current facility-administered medications on file prior to visit.    Allergies  Allergen Reactions  . Clarithromycin Shortness Of Breath  . Lisinopril Swelling    Facial and tongue swelling 10/2012    Blood pressure 132/84, pulse (!) 50, resp. rate 14, height 5\' 11"  (1.803 m), weight 160 lb 9.6 oz (72.8 kg), SpO2 96 %.  Essential hypertension Patient with essential hypertension, much improved now that his medications have been sorted out and he is using pre-packs from [employer].   He believes home readings are much better than in the office and would like to know about stopping losartan, as he is afraid it can damage his good kidney.  Explained the protective benefit these medications can have for the kidneys and assured him that he is not causing any added damage by continuing with the medication.  We will have him return in 2 months and stressed that he needs to record home BP readings and bring that information, along with his device, to his next appointment.    Tommy Medal PharmD CPP [facility] [address] [place] [id number] 01/01/2021 11:55 AM

## 2021-01-01 NOTE — Assessment & Plan Note (Signed)
Patient with essential hypertension, much improved now that his medications have been sorted out and he is using pre-packs from [employer].   He believes home readings are much better than in the office and would like to know about stopping losartan, as he is afraid it can damage his good kidney.  Explained the protective benefit these medications can have for the kidneys and assured him that he is not causing any added damage by continuing with the medication.  We will have him return in 2 months and stressed that he needs to record home BP readings and bring that information, along with his device, to his next appointment.

## 2021-01-07 ENCOUNTER — Inpatient Hospital Stay: Payer: Medicare Other

## 2021-01-07 ENCOUNTER — Inpatient Hospital Stay: Payer: Medicare Other | Admitting: Nurse Practitioner

## 2021-01-22 ENCOUNTER — Inpatient Hospital Stay: Payer: Medicare Other

## 2021-01-22 ENCOUNTER — Inpatient Hospital Stay (HOSPITAL_BASED_OUTPATIENT_CLINIC_OR_DEPARTMENT_OTHER): Payer: Medicare Other | Admitting: Physician Assistant

## 2021-01-22 ENCOUNTER — Inpatient Hospital Stay: Payer: Medicare Other | Attending: Nurse Practitioner

## 2021-01-22 ENCOUNTER — Other Ambulatory Visit: Payer: Self-pay

## 2021-01-22 VITALS — BP 149/78 | HR 66 | Temp 97.8°F | Resp 18 | Ht 71.0 in | Wt 159.6 lb

## 2021-01-22 DIAGNOSIS — R110 Nausea: Secondary | ICD-10-CM | POA: Insufficient documentation

## 2021-01-22 DIAGNOSIS — C184 Malignant neoplasm of transverse colon: Secondary | ICD-10-CM | POA: Diagnosis present

## 2021-01-22 DIAGNOSIS — G620 Drug-induced polyneuropathy: Secondary | ICD-10-CM | POA: Insufficient documentation

## 2021-01-22 DIAGNOSIS — R918 Other nonspecific abnormal finding of lung field: Secondary | ICD-10-CM | POA: Diagnosis not present

## 2021-01-22 DIAGNOSIS — R0609 Other forms of dyspnea: Secondary | ICD-10-CM | POA: Diagnosis not present

## 2021-01-22 DIAGNOSIS — Z79899 Other long term (current) drug therapy: Secondary | ICD-10-CM | POA: Diagnosis not present

## 2021-01-22 DIAGNOSIS — Z809 Family history of malignant neoplasm, unspecified: Secondary | ICD-10-CM | POA: Insufficient documentation

## 2021-01-22 DIAGNOSIS — J45909 Unspecified asthma, uncomplicated: Secondary | ICD-10-CM | POA: Diagnosis not present

## 2021-01-22 DIAGNOSIS — N182 Chronic kidney disease, stage 2 (mild): Secondary | ICD-10-CM | POA: Insufficient documentation

## 2021-01-22 DIAGNOSIS — I130 Hypertensive heart and chronic kidney disease with heart failure and stage 1 through stage 4 chronic kidney disease, or unspecified chronic kidney disease: Secondary | ICD-10-CM | POA: Diagnosis not present

## 2021-01-22 DIAGNOSIS — I5022 Chronic systolic (congestive) heart failure: Secondary | ICD-10-CM | POA: Diagnosis not present

## 2021-01-22 DIAGNOSIS — Z95828 Presence of other vascular implants and grafts: Secondary | ICD-10-CM

## 2021-01-22 DIAGNOSIS — T451X5A Adverse effect of antineoplastic and immunosuppressive drugs, initial encounter: Secondary | ICD-10-CM | POA: Insufficient documentation

## 2021-01-22 DIAGNOSIS — D509 Iron deficiency anemia, unspecified: Secondary | ICD-10-CM | POA: Diagnosis not present

## 2021-01-22 DIAGNOSIS — Z8249 Family history of ischemic heart disease and other diseases of the circulatory system: Secondary | ICD-10-CM | POA: Insufficient documentation

## 2021-01-22 DIAGNOSIS — K5900 Constipation, unspecified: Secondary | ICD-10-CM | POA: Diagnosis not present

## 2021-01-22 DIAGNOSIS — K921 Melena: Secondary | ICD-10-CM | POA: Insufficient documentation

## 2021-01-22 DIAGNOSIS — Z8616 Personal history of COVID-19: Secondary | ICD-10-CM | POA: Insufficient documentation

## 2021-01-22 DIAGNOSIS — Z87891 Personal history of nicotine dependence: Secondary | ICD-10-CM | POA: Insufficient documentation

## 2021-01-22 DIAGNOSIS — Z841 Family history of disorders of kidney and ureter: Secondary | ICD-10-CM | POA: Diagnosis not present

## 2021-01-22 LAB — CEA (ACCESS): CEA (CHCC): 1.77 ng/mL (ref 0.00–5.00)

## 2021-01-22 MED ORDER — HEPARIN SOD (PORK) LOCK FLUSH 100 UNIT/ML IV SOLN
500.0000 [IU] | Freq: Once | INTRAVENOUS | Status: AC
Start: 2021-01-22 — End: 2021-01-22
  Administered 2021-01-22: 500 [IU]
  Filled 2021-01-22: qty 5

## 2021-01-22 MED ORDER — SODIUM CHLORIDE 0.9% FLUSH
10.0000 mL | Freq: Once | INTRAVENOUS | Status: AC
  Administered 2021-01-22: 10 mL
  Filled 2021-01-22: qty 10

## 2021-01-22 NOTE — Progress Notes (Signed)
[facility] OFFICE PROGRESS NOTE   Diagnosis: Colon cancer  INTERVAL HISTORY:  Joe Garcia returns today for a follow up visit. He is doing well with stable energy levels. He notes that he can complete his ADLs on his own. He has a good appetite without any noticeable weight loss. Patient has mild nausea without any vomiting episodes. He denies any abdominal pain. He has constipation with a bowel movement every other day. He currently takes miralax without improvement of symptoms. Patient notes occasional episodes of hematochezia, mainly when he strains. 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 He has dyspnea on exertion secondary to asthma. Patient denies any fevers, chills, night sweats, chest pain or cough. He has no other complaints.   Past Medical History:  Diagnosis Date   Alcoholism ([facility])    Allergy    Arthritis    hips, L shoulder   Asthma    Cancer ([place]) 05/2019   colon cancer   CHF (congestive heart failure) ([facility])    Chronic kidney disease    only has one kidney   COPD (chronic obstructive pulmonary disease) ([place])    COVID-19    Depression    pt denies   Dyspnea    Dysrhythmia    PVC's   Family history of anesthesia complication    GERD (gastroesophageal reflux disease)    Heart murmur    Hyperlipidemia    Hypertension    Myocardial infarction [facility])    pt is unsure of date- believes early 2020   Osteoporosis    Pneumonia    Substance abuse ([place])    alcoholism over 10 yrs ago   Family History  Problem Relation Age of Onset   Heart disease Father    Heart disease Daughter        "fluid around heart"    Canavan disease Daughter    Cancer Daughter        unsure of type   Asthma Son    Heart failure Mother    Kidney failure Mother    Colon cancer Neg Hx    Esophageal cancer Neg Hx    Rectal cancer Neg Hx    Stomach cancer Neg Hx    Social History   Socioeconomic History   Marital status: Single    Spouse  name: Not on file   Number of children: Not on file   Years of education: Not on file   Highest education level: Not on file  Occupational History   Occupation: "it don't work for me", on disability  Tobacco Use   Smoking status: Former    Packs/day: 0.10    Years: 45.00    Pack years: 4.50    Types: Cigarettes    Quit date: 2012    Years since quitting: 10.5   Smokeless tobacco: Never  Vaping Use   Vaping Use: Never used  Substance and Sexual Activity   Alcohol use: No    Comment: Pt states no etoh since April 2014   Drug use: Not Currently    Comment: Prior use of crack cocaine, quit 2012   Sexual activity: Never  Other Topics Concern   Not on file  Social History Narrative   Not on file   Social Determinants of Health   Financial Resource Strain: Not on file  Food Insecurity: Not on file  Transportation Needs: Not on file  Physical Activity: Not on file  Stress: Not on file  Social Connections: Not  on file  Intimate Partner Violence: Not on file   Past Surgical History:  Procedure Laterality Date   BIOPSY  06/27/2019   Procedure: BIOPSY;  Surgeon: Yetta Flock, MD;  Location: [facility] ENDOSCOPY;  Service: Gastroenterology;;   CATARACT EXTRACTION     COLONOSCOPY Left 06/27/2019   Procedure: COLONOSCOPY;  Surgeon: Yetta Flock, MD;  Location: [place];  Service: Gastroenterology;  Laterality: Left;   COLONOSCOPY     LAPAROTOMY N/A 06/30/2019   Procedure: EXPLORATORY LAPAROTOMY;  Surgeon: Ralene Ok, MD;  Location: [place];  Service: General;  Laterality: N/A;   LEFT HEART CATH AND CORONARY ANGIOGRAPHY N/A 10/04/2018   Procedure: LEFT HEART CATH AND CORONARY ANGIOGRAPHY;  Surgeon: Jettie Booze, MD;  Location: [place] CV LAB;  Service: Cardiovascular;  Laterality: N/A;   PARTIAL COLECTOMY N/A 06/30/2019   Procedure: PARTIAL COLECTOMY;  Surgeon: Ralene Ok, MD;  Location: [place];  Service: General;  Laterality: N/A;   PORTACATH  PLACEMENT Left 08/02/2019   Procedure: INSERTION PORT-A-CATH LEFT CHEST;  Surgeon: Ralene Ok, MD;  Location: [place];  Service: General;  Laterality: Left;   RIGHT/LEFT HEART CATH AND CORONARY ANGIOGRAPHY N/A 06/12/2017   Procedure: RIGHT/LEFT HEART CATH AND CORONARY ANGIOGRAPHY;  Surgeon: Jolaine Artist, MD;  Location: [place] CV LAB;  Service: Cardiovascular;  Laterality: N/A;   Current Outpatient Medications  Medication Instructions   albuterol (VENTOLIN HFA) 108 (90 Base) MCG/ACT inhaler 2 puffs, Inhalation, Every 4 hours PRN   amiodarone (PACERONE) 100 mg, Oral, Daily   amLODipine (NORVASC) 5 mg, Oral, Every evening   budesonide-formoterol (SYMBICORT) 160-4.5 MCG/ACT inhaler 2 puffs, Inhalation, 2 times daily   carvedilol (COREG) 3.125 mg, Oral, 2 times daily with meals   famotidine (PEPCID) 20 mg, Oral, Daily   fluticasone (FLONASE) 50 MCG/ACT nasal spray 2 sprays, Each Nare, Daily PRN   furosemide (LASIX) 20 mg, Oral, Daily   isosorbide-hydrALAZINE (BIDIL) 20-37.5 MG tablet 1 tablet, Oral, 3 times daily   losartan (COZAAR) 50 mg, Oral, Daily   metoCLOPramide (REGLAN) 5 MG tablet Take 37m 3 hours prior to procedure on June 1st.   montelukast (SINGULAIR) 10 mg, Oral, Daily at bedtime   omeprazole (PRILOSEC) 40 mg, Oral, 2 times daily   REVIEW OF SYSTEMS: Review of Systems  Constitutional:  Negative for chills, fever, malaise/fatigue and weight loss.  Respiratory:  Negative for cough, hemoptysis and wheezing.        Dyspnea on exertion   Cardiovascular:  Negative for chest pain and leg swelling.  Gastrointestinal:  Positive for constipation and nausea. Negative for abdominal pain, diarrhea and vomiting.  Skin:  Negative for rash.  Neurological:  Positive for tingling (Neuropathy in feet). Negative for dizziness and headaches.  Endo/Heme/Allergies:  Does not bruise/bleed easily.   Objective: Performance Status: ECOG 1 VITALS: Blood pressure (!) 149/78, pulse 66,  temperature 97.8 F (36.6 C), temperature source Oral, resp. rate 18, height _0  (1.803 m), weight 159 lb 9.6 oz (72.4 kg), SpO2 98 %.  Lymphatics: No cervical, supraclavicular, axillary, or inguinal nodes Resp: Lungs clear bilaterally Cardio: Regular rate and rhythm GI: No hepatosplenomegaly, nontender, no mass Vascular: No leg edema  Portacath/PICC-without erythema  Lab Results:  Lab Results  Component Value Date   WBC 5.7 06/07/2020   HGB 12.1 (L) 06/07/2020   HCT 37.1 (L) 06/07/2020   MCV 88.5 06/07/2020   PLT 205 06/07/2020   NEUTROABS 2.8 06/07/2020    CMP  Lab Results  Component Value  Date   NA 140 10/22/2020   K 4.1 10/22/2020   CL 105 10/22/2020   CO2 21 10/22/2020   GLUCOSE 87 10/22/2020   BUN 11 10/22/2020   CREATININE 1.34 (H) 10/22/2020   CALCIUM 9.4 10/22/2020   PROT 6.9 09/10/2020   ALBUMIN 3.8 09/10/2020   AST 16 09/10/2020   ALT 20 09/10/2020   ALKPHOS 78 09/10/2020   BILITOT 0.9 09/10/2020   GFRNONAA 50 (L) 09/10/2020   GFRAA >60 02/13/2020    Lab Results  Component Value Date   CEA1 1.12 09/27/2020    Assessment/Plan: Colon cancer, transverse colon, stage IIIc, T4bN2, status post a partial transverse colectomy 06/30/2019 Grade 3, lymphovascular and perineural invasion present, 4/18 lymph nodes positive, positive lymph nodes mesenteric margin, tumor invades into adherent omentum, MSI high, loss of MLH1 and PMS2 expression; MLH1 methylation not detected; BRAF mutation analysis-negative CT abdomen/pelvis 06/26/2019-thickening within the distal transverse colon, severely atrophic and calcified right kidney CT chest 06/28/2019-no evidence of metastatic disease, stable 3 mm right lower lobe nodule Cycle 1 FOLFOX 08/04/2019 (oxaliplatin 65 mg/m due to renal function) Plan for repeat CTs after he has completed 5 cycles of chemotherapy Cycle 2 FOLFOX 08/18/2019 Cycle 3 FOLFOX 08/31/2019, Udenyca added Cycle 4 FOLFOX 10/12/2019 Cycle 5 FOLFOX  10/25/2019 Stable right lower lobe nodule on chest CT 09/21/2019 CT abdomen/pelvis 11/07/2019-postoperative changes related to colectomy and anastomosis.  No current evidence of disease.  No adenopathy. Cycle 6 FOLFOX 11/08/2019 Cycle 7 FOLFOX 11/23/2019 Cycle 8 FOLFOX 12/06/2019 Cycle 9 FOLFOX 12/20/2019 Case presented GI tumor conference 12/28/2018-tumor margin negative, positive lymph node at margin; no recommendation for further surgery or radiation; peripancreatic node on baseline CT not seen on April 2021 CT Cycle 10 FOLFOX 01/03/2020  Cycle 11 FOLFOX 01/17/2020-oxaliplatin and Udenyca held Cycle 12 FOLFOX 01/31/2020-oxaliplatin and Udenyca held, 5-FU dose reduced secondary to mucositis 05/25/2020 colonoscopy-internal hemorrhoids, repeat colonoscopy in 3 years for surveillance CTs 06/11/2020-new trace right pelvic fluid, nonspecific; new mild left upper lobe interstitial/groundglass opacity suspicious for minimal infection or inflammation; similar 2 mm right lower lobe pulmonary nodule Asthma Chronic systolic heart failure History of PVCs Atrophic right kidney Microcytic anemia, likely iron deficiency anemia secondary to #1 Port-A-Cath placement on 12/15/2019, Dr. 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  Constipation: Currently on miralax with mild improvement.  Recommend to add senakot to bowel regimen.   Disposition: Mr. Sherod returns today for a follow up. He is doing well without any significant limitations. CEA from today was within normal limits. He continues to be in clinical remission from colon cancer.   He would like to keep the Port-A-Cath in place so we  will schedule port flush q 6 weeks until return appt. Last colonoscopy was on 05/25/2020 that was unremarkable except for internal hemorrhoids. Next colonoscopy due in 3 years in 2024. Last CT scan was 06/11/2020 without evidence of recurrence or metastatic disease. He will return for an office visit, labs and CT scan in November 2022.   Patient expressed understanding and satisfaction with the plan provided.   I have spent a total of 25 minutes minutes of face-to-face and non-face-to-face time, preparing to see the patient, obtaining and/or reviewing separately obtained history, performing a medically appropriate examination, counseling and educating the patient, ordering tests,  documenting clinical information in the electronic health record, and care coordination.    Lincoln Brigham PA-C Hematology and [place]  01/22/2021  11:52 AM

## 2021-01-23 LAB — CEA (IN HOUSE-CHCC): CEA (CHCC-In House): 1.3 ng/mL (ref 0.00–5.00)

## 2021-01-24 ENCOUNTER — Encounter: Payer: Self-pay | Admitting: Oncology

## 2021-02-04 ENCOUNTER — Encounter: Payer: Self-pay | Admitting: Certified Registered Nurse Anesthetist

## 2021-02-05 ENCOUNTER — Encounter: Payer: Self-pay | Admitting: Gastroenterology

## 2021-02-05 ENCOUNTER — Ambulatory Visit (AMBULATORY_SURGERY_CENTER): Payer: Medicare Other | Admitting: Gastroenterology

## 2021-02-05 ENCOUNTER — Other Ambulatory Visit: Payer: Self-pay

## 2021-02-05 VITALS — BP 121/61 | HR 76 | Temp 98.0°F | Resp 13 | Ht 71.0 in | Wt 160.0 lb

## 2021-02-05 DIAGNOSIS — R1013 Epigastric pain: Secondary | ICD-10-CM

## 2021-02-05 DIAGNOSIS — K449 Diaphragmatic hernia without obstruction or gangrene: Secondary | ICD-10-CM | POA: Diagnosis not present

## 2021-02-05 DIAGNOSIS — K219 Gastro-esophageal reflux disease without esophagitis: Secondary | ICD-10-CM | POA: Diagnosis not present

## 2021-02-05 DIAGNOSIS — K319 Disease of stomach and duodenum, unspecified: Secondary | ICD-10-CM

## 2021-02-05 DIAGNOSIS — K2950 Unspecified chronic gastritis without bleeding: Secondary | ICD-10-CM

## 2021-02-05 MED ORDER — SODIUM CHLORIDE 0.9 % IV SOLN: 500.0000 mL | Freq: Once | INTRAVENOUS | Status: DC

## 2021-02-05 NOTE — Patient Instructions (Signed)
YOU HAD AN ENDOSCOPIC PROCEDURE TODAY AT THE [facility]:   Refer to the procedure report that was given to you for any specific questions about what was found during the examination.  If the procedure report does not answer your questions, please call your gastroenterologist to clarify.  If you requested that your care partner not be given the details of your procedure findings, then the procedure report has been included in a sealed envelope for you to review at your convenience later.  YOU SHOULD EXPECT: Some feelings of bloating in the abdomen. Passage of more gas than usual.  Walking can help get rid of the air that was put into your GI tract during the procedure and reduce the bloating. If you had a lower endoscopy (such as a colonoscopy or flexible sigmoidoscopy) you may notice spotting of blood in your stool or on the toilet paper. If you underwent a bowel prep for your procedure, you may not have a normal bowel movement for a few days.  Please Note:  You might notice some irritation and congestion in your nose or some drainage.  This is from the oxygen used during your procedure.  There is no need for concern and it should clear up in a day or so.  SYMPTOMS TO REPORT IMMEDIATELY:   Following lower endoscopy (colonoscopy or flexible sigmoidoscopy):  Excessive amounts of blood in the stool  Significant tenderness or worsening of abdominal pains  Swelling of the abdomen that is new, acute  Fever of 100F or higher   Following upper endoscopy (EGD)  Vomiting of blood or coffee ground material  New chest pain or pain under the shoulder blades  Painful or persistently difficult swallowing  New shortness of breath  Fever of 100F or higher  Black, tarry-looking stools  For urgent or emergent issues, a gastroenterologist can be reached at any hour by calling [phone]. Do not use MyChart messaging for urgent concerns.    DIET:  We do recommend a small meal at first, but  then you may proceed to your regular diet.  Drink plenty of fluids but you should avoid alcoholic beverages for 24 hours.  ACTIVITY:  You should plan to take it easy for the rest of today and you should NOT DRIVE or use heavy machinery until tomorrow (because of the sedation medicines used during the test).    FOLLOW UP: Our staff will call the number listed on your records 48-72 hours following your procedure to check on you and address any questions or concerns that you may have regarding the information given to you following your procedure. If we do not reach you, we will leave a message.  We will attempt to reach you two times.  During this call, we will ask if you have developed any symptoms of COVID 19. If you develop any symptoms (ie: fever, flu-like symptoms, shortness of breath, cough etc.) before then, please call [phone].  If you test positive for Covid 19 in the 2 weeks post procedure, please call and report this information to us.    If any biopsies were taken you will be contacted by phone or by letter within the next 1-3 weeks.  Please call us at [phone] if you have not heard about the biopsies in 3 weeks.    SIGNATURES/CONFIDENTIALITY: You and/or your care partner have signed paperwork which will be entered into your electronic medical record.  These signatures attest to the fact that that the information above on   your After Visit Summary has been reviewed and is understood.  Full responsibility of the confidentiality of this discharge information lies with you and/or your care-partner. 

## 2021-02-05 NOTE — Progress Notes (Signed)
Medical history reviewed with no changes noted. VS assessed by N.C 

## 2021-02-05 NOTE — Progress Notes (Signed)
Called to room to assist during endoscopic procedure.  Patient ID and intended procedure confirmed with present staff. Received instructions for my participation in the procedure from the performing physician.  

## 2021-02-05 NOTE — Op Note (Signed)
[facility] Patient Name: Fleming Prill Procedure Date: 02/05/2021 8:26 AM MRN: [id number] Endoscopist: Remo Lipps P. Havery Moros , MD Age: 70 Referring MD:  Date of Birth: 11/10/1950 Gender: Male Account #: [id number] Procedure:                Upper GI endoscopy Indications:              postprandial epigastric abdominal pain, Follow-up                            of gastro-esophageal reflux disease - on omeprazole                            and pepcid with some improvement but symptoms                            persist to some extent. CT scan negative 05/2020.                            EGD 10/2020 limited by significant amount of                            retained food in the stomach and incomplete exam.                            Reglan given prior to this exam. Medicines:                Monitored Anesthesia Care Procedure:                Pre-Anesthesia Assessment:                           - Prior to the procedure, a History and Physical                            was performed, and patient medications and                            allergies were reviewed. The patient's tolerance of                            previous anesthesia was also reviewed. The risks                            and benefits of the procedure and the sedation                            options and risks were discussed with the patient.                            All questions were answered, and informed consent                            was obtained. Prior Anticoagulants: The patient has  taken no previous anticoagulant or antiplatelet                            agents. ASA Grade Assessment: III - A patient with                            severe systemic disease. After reviewing the risks                            and benefits, the patient was deemed in                            satisfactory condition to undergo the procedure.                           After obtaining  informed consent, the endoscope was                            passed under direct vision. Throughout the                            procedure, the patient's blood pressure, pulse, and                            oxygen saturations were monitored continuously. The                            Endoscope was introduced through the mouth, and                            advanced to the second part of duodenum. The upper                            GI endoscopy was accomplished without difficulty.                            The patient tolerated the procedure well. Scope In: Scope Out: Findings:                 Esophagogastric landmarks were identified: the                            Z-line was found at 39 cm, the gastroesophageal                            junction was found at 40 cm and the upper extent of                            the gastric folds was found at 42 cm from the                            incisors.  The Z-line was irregular with a tongue of salmon                            colored mucosa and was found 39 cm from the                            incisors. Biopsies were taken with a cold forceps                            for histology.                           The exam of the esophagus was otherwise normal.                           Patchy mildly erythematous mucosa was found in the                            gastric body.                           The proximal stomach did not retain air well at                            all, retroflexed views of the cardia quite                            difficult to obtain due to this issue. The exam of                            the stomach was otherwise normal.                           Biopsies were taken with a cold forceps in the                            gastric body, at the incisura and in the gastric                            antrum for Helicobacter pylori testing.                           The duodenal bulb  and second portion of the                            duodenum were normal. Complications:            No immediate complications. Estimated blood loss:                            Minimal. Estimated Blood Loss:     Estimated blood loss was minimal. Impression:               - Esophagogastric landmarks identified.                           -  Z-line irregular, 39 cm from the incisors.                            Biopsied.                           - Normal esophagus otherwise                           - Erythematous mucosa in the gastric body.                           - Poor air retention of the proximal stomach as                            above                           - Normal stomach otherwise, biopsies taken to rule                            out H pylori                           - Normal duodenal bulb and second portion of the                            duodenum. Recommendation:           - Patient has a contact number available for                            emergencies. The signs and symptoms of potential                            delayed complications were discussed with the                            patient. Return to normal activities tomorrow.                            Written discharge instructions were provided to the                            patient.                           - Resume previous diet.                           - Continue present medications.                           - Await pathology results.                           - Consideration for gastric emptying study vs.  empiric trial of Reglan if symptoms persist, will                            discuss with patient Carlota Raspberry. Tonya Carlile, MD 02/05/2021 8:55:22 AM This report has been signed electronically.

## 2021-02-05 NOTE — Progress Notes (Signed)
Report given to PACU, vss 

## 2021-02-05 NOTE — Progress Notes (Signed)
0832 Robinul 0.1 mg IV given due large amount of secretions upon assessment.  MD made aware, vss  

## 2021-02-07 ENCOUNTER — Telehealth: Payer: Self-pay

## 2021-02-07 NOTE — Telephone Encounter (Signed)
  Follow up Call-  Call back number 02/05/2021 10/31/2020 05/25/2020  Post procedure Call Back phone  # [phone]  Permission to leave phone message No Yes No  comments - not sure if voice mail works -  Some recent data might be hidden     Patient questions:  Do you have a fever, pain , or abdominal swelling? No. Pain Score  0 *  Have you tolerated food without any problems? Yes.    Have you been able to return to your normal activities? Yes.    Do you have any questions about your discharge instructions: Diet   No. Medications  No. Follow up visit  No.  Do you have questions or concerns about your Care? No.  Actions: * If pain score is 4 or above: No action needed, pain <4.  Have you developed a fever since your procedure? no  2.   Have you had an respiratory symptoms (SOB or cough) since your procedure? no  3.   Have you tested positive for COVID 19 since your procedure no  4.   Have you had any family members/close contacts diagnosed with the COVID 19 since your procedure?  no   If yes to any of these questions please route to Joylene John, RN and Joella Prince, RN

## 2021-02-13 ENCOUNTER — Other Ambulatory Visit: Payer: Self-pay

## 2021-02-13 DIAGNOSIS — R1013 Epigastric pain: Secondary | ICD-10-CM

## 2021-02-13 DIAGNOSIS — K219 Gastro-esophageal reflux disease without esophagitis: Secondary | ICD-10-CM

## 2021-02-13 MED ORDER — METOCLOPRAMIDE HCL 5 MG PO TABS: 5.0000 mg | ORAL_TABLET | Freq: Three times a day (TID) | ORAL | 0 refills | Status: DC

## 2021-03-05 ENCOUNTER — Ambulatory Visit: Payer: Medicare Other

## 2021-03-05 ENCOUNTER — Other Ambulatory Visit: Payer: Self-pay

## 2021-03-05 ENCOUNTER — Inpatient Hospital Stay: Payer: Medicare Other | Attending: Nurse Practitioner

## 2021-03-05 VITALS — BP 140/81 | HR 60 | Temp 98.2°F | Resp 20

## 2021-03-05 DIAGNOSIS — C184 Malignant neoplasm of transverse colon: Secondary | ICD-10-CM | POA: Insufficient documentation

## 2021-03-05 DIAGNOSIS — Z452 Encounter for adjustment and management of vascular access device: Secondary | ICD-10-CM | POA: Insufficient documentation

## 2021-03-05 DIAGNOSIS — Z95828 Presence of other vascular implants and grafts: Secondary | ICD-10-CM

## 2021-03-05 MED ORDER — HEPARIN SOD (PORK) LOCK FLUSH 100 UNIT/ML IV SOLN
500.0000 [IU] | Freq: Once | INTRAVENOUS | Status: AC
  Administered 2021-03-05: 500 [IU]
  Filled 2021-03-05: qty 5

## 2021-03-05 MED ORDER — SODIUM CHLORIDE 0.9% FLUSH
10.0000 mL | Freq: Once | INTRAVENOUS | Status: AC
  Administered 2021-03-05: 10 mL
  Filled 2021-03-05: qty 10

## 2021-03-08 ENCOUNTER — Ambulatory Visit (INDEPENDENT_AMBULATORY_CARE_PROVIDER_SITE_OTHER): Payer: Medicare Other | Admitting: Pharmacist

## 2021-03-08 ENCOUNTER — Other Ambulatory Visit: Payer: Self-pay

## 2021-03-08 ENCOUNTER — Telehealth: Payer: Self-pay

## 2021-03-08 VITALS — BP 138/76 | HR 62 | Resp 16 | Ht 71.0 in | Wt 158.6 lb

## 2021-03-08 DIAGNOSIS — I10 Essential (primary) hypertension: Secondary | ICD-10-CM

## 2021-03-08 DIAGNOSIS — I5022 Chronic systolic (congestive) heart failure: Secondary | ICD-10-CM | POA: Diagnosis not present

## 2021-03-08 NOTE — Telephone Encounter (Signed)
Called and spoke w/pt who stated that their grandchild got ahold of the papers they was witting their bp readings down to bring to their pharmd appt and that they also do not have their machine. I was just calling per kristin alvstad's request as this information is vital to the pharmd providers during their appt

## 2021-03-08 NOTE — Patient Instructions (Addendum)
It was nice meeting you today  We would like to keep your blood pressure less than 130/80 but since you feel dizzy when your blood pressure is in th 120s, we will not make any changes today  Continue all your current medications supplied to you by your pharmacy  We will check your lab work today to check your kidney function  We will see you back in 1 month.  Please remember to bring your blood pressure cuff  Karren Cobble, PharmD, BCACP, CDCES, [place] [id number] [address] [place], [place] [address] Phone: [phone]; Fax: [phone] 03/08/2021 12:03 PM

## 2021-03-08 NOTE — Progress Notes (Signed)
Patient ID: Joe Garcia                 DOB: 06/14/1951                      MRN: [id number]     HPI: Joe Garcia is a 70 y.o. male referred by Roby Lofts to pharmacy clinic for HF medication management. PMH is significant for HTN, CHF, NSTEMI, COPD, CKD, and alcohol abuse. Most recent LVEF 55-60% on 09/20/20.  Today she returns to pharmacy clinic for further medication titration. Symptomatically, he is feeling great. Reports this is is the best he has felt in years.  Denies dizziness, lightheadedness, and fatigue. Denies chest pain or palpitations. Feels SOB only when walking up long hills.  However he says now he can go for longer walks then he has been able to in years,  Able to complete all ADLs. Denies LEE, PND, or orthopnea. Reports he does not have the strongest appetite.  Does not adhere to a low salt diet however.  Eats many microwave dinners.  Drinks 1 sprite a day but usually drinks water.  .  Patient was instructed to take BP at home and keep logs.  However he reports his grandchildren destroyed his log and broke his BP cuff.  Purchased a new cuff 3 days ago but did not bring it.  Reports BP readings typically in 130s.  Occasionally in the 140s.  Feels lightheaded and dizzy when BP in 120s so he prefers systolic BP in the [id number].  Reports BP this morning was 136/70.  Receives medications prepackaged from pharmacy so he is not sure exactly what he is taking but he reports compliance.  Current CHF meds:  BiDiL  20-37.'5mg'$  TID Carvedilol 3.'125mg'$  BID Losartan '50mg'$  daily Furosemide '20mg'$  daily  HTN meds: Amlodipine '10mg'$  daily  Previously tried: Entresto BP goal: <130/80, however patient prefers systolic [id number]   Wt Readings from Last 3 Encounters:  03/08/21 158 lb 9.6 oz (71.9 kg)  02/05/21 160 lb (72.6 kg)  01/22/21 159 lb 9.6 oz (72.4 kg)   BP Readings from Last 3 Encounters:  03/08/21 120/80  03/05/21 140/81  02/05/21 121/61   Pulse Readings from Last 3  Encounters:  03/08/21 62  03/05/21 60  02/05/21 76    Renal function: CrCl cannot be calculated (Patient's most recent lab result is older than the maximum 21 days allowed.).  Past Medical History:  Diagnosis Date   Alcoholism ([place])    Allergy    Arthritis    hips, L shoulder   Asthma    Cancer ([place]) 05/2019   colon cancer   CHF (congestive heart failure) ([facility])    Chronic kidney disease    only has one kidney   COPD (chronic obstructive pulmonary disease) ([place])    COVID-19    Depression    pt denies   Dyspnea    Dysrhythmia    PVC's   Family history of anesthesia complication    GERD (gastroesophageal reflux disease)    Heart murmur    Hyperlipidemia    Hypertension    Myocardial infarction [facility])    pt is unsure of date- believes early 2020   Osteoporosis    Pneumonia    Substance abuse ([place])    alcoholism over 10 yrs ago    Current Outpatient Medications on File Prior to Visit  Medication Sig Dispense Refill   albuterol (VENTOLIN HFA) 108 (90 Base) MCG/ACT inhaler Inhale 2  puffs into the lungs every 4 (four) hours as needed for wheezing or shortness of breath. 18 g 0   amiodarone (PACERONE) 100 MG tablet Take 1 tablet (100 mg total) by mouth daily. 30 tablet 3   amLODipine (NORVASC) 5 MG tablet Take 1 tablet (5 mg total) by mouth every evening. 90 tablet 3   budesonide-formoterol (SYMBICORT) 160-4.5 MCG/ACT inhaler Inhale 2 puffs into the lungs 2 (two) times daily. 1 Inhaler 11   fluticasone (FLONASE) 50 MCG/ACT nasal spray Place 2 sprays into both nostrils daily as needed for allergies or rhinitis.     furosemide (LASIX) 20 MG tablet Take 1 tablet (20 mg total) by mouth daily. 90 tablet 3   montelukast (SINGULAIR) 10 MG tablet Take 10 mg by mouth at bedtime.     omeprazole (PRILOSEC) 40 MG capsule Take 40 mg by mouth in the morning and at bedtime.     carvedilol (COREG) 3.125 MG tablet Take 1 tablet (3.125 mg total) by mouth 2 (two) times daily with a meal. 180  tablet 5   famotidine (PEPCID) 20 MG tablet Take 20 mg by mouth daily. (Patient not taking: Reported on 03/08/2021)     isosorbide-hydrALAZINE (BIDIL) 20-37.5 MG tablet Take 1 tablet by mouth 3 (three) times daily. 270 tablet 1   losartan (COZAAR) 50 MG tablet Take 1 tablet (50 mg total) by mouth daily. 90 tablet 3   metoCLOPramide (REGLAN) 5 MG tablet Take 1 tablet (5 mg total) by mouth 3 (three) times daily before meals. 90 tablet 0   No current facility-administered medications on file prior to visit.    Allergies  Allergen Reactions   Clarithromycin Shortness Of Breath   Lisinopril Swelling    Facial and tongue swelling 10/2012     Assessment/Plan:  1. CHF - Patient BP in room today 120/80, however checked at end of appointment and was 142/82.  Verified with machine and received value of 138/76 which is similar to his home reading this morning.  Patient reports he feels great and does not want to increase or change any medications at this point.  Has follow up with Dr. Gwenlyn Found in 1 month.  Needs updated BMP however which patient is agreeable to updating today.  Continue amlodipine '5mg'$  daily Continue carvedilol 3.'125mg'$  BID Continue furosemide '20mg'$  daily Continue Bidil 20-37.5 TID Continue losartan '50mg'$  daily Check BMP Recheck with Dr Gwenlyn Found in 1 month  Karren Cobble, PharmD, BCACP, [place], [place] [id number] [place], [place] [address] Phone: [phone]; Fax: [phone] 03/08/2021 3:49 PM

## 2021-03-15 ENCOUNTER — Encounter: Payer: Self-pay | Admitting: Oncology

## 2021-03-15 ENCOUNTER — Telehealth: Payer: Self-pay | Admitting: Pharmacist

## 2021-03-15 LAB — BASIC METABOLIC PANEL
BUN/Creatinine Ratio: 12 (ref 10–24)
BUN: 17 mg/dL (ref 8–27)
CO2: 22 mmol/L (ref 20–29)
Calcium: 9 mg/dL (ref 8.6–10.2)
Chloride: 106 mmol/L (ref 96–106)
Creatinine, Ser: 1.4 mg/dL — ABNORMAL HIGH (ref 0.76–1.27)
Glucose: 95 mg/dL (ref 65–99)
Potassium: 4.4 mmol/L (ref 3.5–5.2)
Sodium: 143 mmol/L (ref 134–144)
eGFR: 55 mL/min/{1.73_m2} — ABNORMAL LOW (ref >59)

## 2021-03-15 NOTE — Telephone Encounter (Signed)
Attempted to call patient regarding labs.  No answer, VM not set up

## 2021-03-19 ENCOUNTER — Telehealth: Payer: Self-pay

## 2021-03-19 NOTE — Telephone Encounter (Addendum)
Could not leave voice message for patient has not set up voice mailbox.  ----- Message from Abigail Butts, PA-C sent at 03/15/2021  5:02 PM EDT ----- Please notify the patient that his kidney function and electrolyte levels appear stable from 4 months ago. Would continue his current medications and plans to follow-up with Dr. Gwenlyn Found as scheduled. Thank you!

## 2021-04-09 ENCOUNTER — Other Ambulatory Visit: Payer: Self-pay | Admitting: Cardiovascular Disease

## 2021-04-16 ENCOUNTER — Other Ambulatory Visit: Payer: Self-pay

## 2021-04-16 ENCOUNTER — Inpatient Hospital Stay: Payer: Medicare Other | Attending: Nurse Practitioner

## 2021-04-16 VITALS — BP 124/70 | HR 61 | Temp 97.5°F | Resp 18

## 2021-04-16 DIAGNOSIS — C184 Malignant neoplasm of transverse colon: Secondary | ICD-10-CM | POA: Insufficient documentation

## 2021-04-16 DIAGNOSIS — Z452 Encounter for adjustment and management of vascular access device: Secondary | ICD-10-CM | POA: Diagnosis not present

## 2021-04-16 DIAGNOSIS — Z95828 Presence of other vascular implants and grafts: Secondary | ICD-10-CM

## 2021-04-16 MED ORDER — SODIUM CHLORIDE 0.9% FLUSH
10.0000 mL | Freq: Once | INTRAVENOUS | Status: AC
  Administered 2021-04-16: 10 mL

## 2021-04-16 MED ORDER — HEPARIN SOD (PORK) LOCK FLUSH 100 UNIT/ML IV SOLN
500.0000 [IU] | Freq: Once | INTRAVENOUS | Status: AC
  Administered 2021-04-16: 500 [IU]

## 2021-04-16 NOTE — Patient Instructions (Signed)
Implanted Port Home Guide An implanted port is a device that is placed under the skin. It is usually placed in the chest. The device can be used to give IV medicine, to take blood, or for dialysis. You may have an implanted port if: You need IV medicine that would be irritating to the small veins in your hands or arms. You need IV medicines, such as antibiotics, for a long period of time. You need IV nutrition for a long period of time. You need dialysis. When you have a port, your health care provider can choose to use the port instead of veins in your arms for these procedures. You may have fewer limitations when using a port than you would if you used other types of long-term IVs, and you will likely be able to return to normal activities after your incision heals. An implanted port has two main parts: Reservoir. The reservoir is the part where a needle is inserted to give medicines or draw blood. The reservoir is round. After it is placed, it appears as a small, raised area under your skin. Catheter. The catheter is a thin, flexible tube that connects the reservoir to a vein. Medicine that is inserted into the reservoir goes into the catheter and then into the vein. How is my port accessed? To access your port: A numbing cream may be placed on the skin over the port site. Your health care provider will put on a mask and sterile gloves. The skin over your port will be cleaned carefully with a germ-killing soap and allowed to dry. Your health care provider will gently pinch the port and insert a needle into it. Your health care provider will check for a blood return to make sure the port is in the vein and is not clogged. If your port needs to remain accessed to get medicine continuously (constant infusion), your health care provider will place a clear bandage (dressing) over the needle site. The dressing and needle will need to be changed every week, or as told by your health care provider. What  is flushing? Flushing helps keep the port from getting clogged. Follow instructions from your health care provider about how and when to flush the port. Ports are usually flushed with saline solution or a medicine called heparin. The need for flushing will depend on how the port is used: If the port is only used from time to time to give medicines or draw blood, the port may need to be flushed: Before and after medicines have been given. Before and after blood has been drawn. As part of routine maintenance. Flushing may be recommended every 4-6 weeks. If a constant infusion is running, the port may not need to be flushed. Throw away any syringes in a disposal container that is meant for sharp items (sharps container). You can buy a sharps container from a pharmacy, or you can make one by using an empty hard plastic bottle with a cover. How long will my port stay implanted? The port can stay in for as long as your health care provider thinks it is needed. When it is time for the port to come out, a surgery will be done to remove it. The surgery will be similar to the procedure that was done to put the port in. Follow these instructions at home:  Flush your port as told by your health care provider. If you need an infusion over several days, follow instructions from your health care provider about how   to take care of your port site. Make sure you: Wash your hands with soap and water before you change your dressing. If soap and water are not available, use alcohol-based hand sanitizer. Change your dressing as told by your health care provider. Place any used dressings or infusion bags into a plastic bag. Throw that bag in the trash. Keep the dressing that covers the needle clean and dry. Do not get it wet. Do not use scissors or sharp objects near the tube. Keep the tube clamped, unless it is being used. Check your port site every day for signs of infection. Check for: Redness, swelling, or  pain. Fluid or blood. Pus or a bad smell. Protect the skin around the port site. Avoid wearing bra straps that rub or irritate the site. Protect the skin around your port from seat belts. Place a soft pad over your chest if needed. Bathe or shower as told by your health care provider. The site may get wet as long as you are not actively receiving an infusion. Return to your normal activities as told by your health care provider. Ask your health care provider what activities are safe for you. Carry a medical alert card or wear a medical alert bracelet at all times. This will let health care providers know that you have an implanted port in case of an emergency. Get help right away if: You have redness, swelling, or pain at the port site. You have fluid or blood coming from your port site. You have pus or a bad smell coming from the port site. You have a fever. Summary Implanted ports are usually placed in the chest for long-term IV access. Follow instructions from your health care provider about flushing the port and changing bandages (dressings). Take care of the area around your port by avoiding clothing that puts pressure on the area, and by watching for signs of infection. Protect the skin around your port from seat belts. Place a soft pad over your chest if needed. Get help right away if you have a fever or you have redness, swelling, pain, drainage, or a bad smell at the port site. This information is not intended to replace advice given to you by your health care provider. Make sure you discuss any questions you have with your health care provider. Document Revised: 10/03/2020 Document Reviewed: 11/28/2019 Elsevier Patient Education  2022 Elsevier Inc.  

## 2021-04-26 ENCOUNTER — Ambulatory Visit: Payer: Medicare Other | Admitting: Cardiovascular Disease

## 2021-05-15 ENCOUNTER — Telehealth: Payer: Self-pay | Admitting: Cardiovascular Disease

## 2021-05-15 NOTE — Telephone Encounter (Signed)
Renal function has been stable from previous year.    Medications that could be affecting his kidney function include his losartan and furosemide.  Due to CHF, would not recommend discontinuing losartan.  May be able to discontinue furosemide however he still occasionally has SOB so also would not recommend discontinuing until seen by cardiologist.   Of note, patient had appt with Dr Gwenlyn Found on 9/30 but canceled.

## 2021-05-15 NOTE — Telephone Encounter (Signed)
Spoke with pt regarding recent appointment with PCP. Pt's PCP has told pt that his chronic kidney disease is worsening and PCP attributes this to blood pressure medication. PCP would like for pt medications to be reviewed and possibly switched to some that have less effects on kidneys. Will forward to Pharmacy for recommendations.

## 2021-05-15 NOTE — Telephone Encounter (Signed)
Pt c/o medication issue:  1. Name of Medication: n/a  2. How are you currently taking this medication (dosage and times per day)? N/a  3. Are you having a reaction (difficulty breathing--STAT)? Kidney issues   4. What is your medication issue? Pt states that his kidneys are failing due to blood pressure medicine.. he says pcp tried to explain to him what it was but he didnt understand.. please advise pt.

## 2021-05-20 ENCOUNTER — Ambulatory Visit (HOSPITAL_BASED_OUTPATIENT_CLINIC_OR_DEPARTMENT_OTHER): Admission: RE | Admit: 2021-05-20 | Payer: Medicare Other | Source: Ambulatory Visit

## 2021-05-23 ENCOUNTER — Other Ambulatory Visit: Payer: Self-pay

## 2021-05-23 ENCOUNTER — Ambulatory Visit (HOSPITAL_BASED_OUTPATIENT_CLINIC_OR_DEPARTMENT_OTHER)
Admission: RE | Admit: 2021-05-23 | Discharge: 2021-05-23 | Disposition: A | Discharge status: Home / Self Care | Payer: Medicare Other | Source: Ambulatory Visit | Attending: Physician Assistant | Admitting: Physician Assistant

## 2021-05-23 ENCOUNTER — Encounter (HOSPITAL_BASED_OUTPATIENT_CLINIC_OR_DEPARTMENT_OTHER): Payer: Self-pay

## 2021-05-23 DIAGNOSIS — C184 Malignant neoplasm of transverse colon: Secondary | ICD-10-CM | POA: Diagnosis present

## 2021-05-23 LAB — POCT I-STAT CREATININE: Creatinine, Ser: 1.3 mg/dL — ABNORMAL HIGH (ref 0.61–1.24)

## 2021-05-23 MED ORDER — SODIUM CHLORIDE 0.9% FLUSH
10.0000 mL | INTRAVENOUS | Status: DC | PRN
  Filled 2021-05-23: qty 10

## 2021-05-23 MED ORDER — HEPARIN SOD (PORK) LOCK FLUSH 100 UNIT/ML IV SOLN
500.0000 [IU] | Freq: Once | INTRAVENOUS | Status: AC
  Administered 2021-05-23: 500 [IU] via INTRAVENOUS

## 2021-05-23 MED ORDER — IOHEXOL 300 MG/ML  SOLN
80.0000 mL | Freq: Once | INTRAMUSCULAR | Status: AC | PRN
  Administered 2021-05-23: 80 mL via INTRAVENOUS

## 2021-05-27 ENCOUNTER — Other Ambulatory Visit: Payer: Self-pay | Admitting: Student in an Organized Health Care Education/Training Program

## 2021-05-27 ENCOUNTER — Ambulatory Visit
Admission: RE | Admit: 2021-05-27 | Discharge: 2021-05-27 | Disposition: A | Discharge status: Home / Self Care | Payer: Medicare Other | Source: Ambulatory Visit | Attending: Student in an Organized Health Care Education/Training Program | Admitting: Student in an Organized Health Care Education/Training Program

## 2021-05-27 ENCOUNTER — Other Ambulatory Visit: Payer: Self-pay

## 2021-05-27 DIAGNOSIS — S91302A Unspecified open wound, left foot, initial encounter: Secondary | ICD-10-CM

## 2021-05-27 DIAGNOSIS — C184 Malignant neoplasm of transverse colon: Secondary | ICD-10-CM

## 2021-05-27 NOTE — Telephone Encounter (Signed)
Unable to leave VM

## 2021-05-28 ENCOUNTER — Other Ambulatory Visit: Payer: Self-pay

## 2021-05-28 ENCOUNTER — Inpatient Hospital Stay: Payer: Medicare Other | Attending: Nurse Practitioner

## 2021-05-28 ENCOUNTER — Inpatient Hospital Stay: Payer: Medicare Other

## 2021-05-28 DIAGNOSIS — T451X5A Adverse effect of antineoplastic and immunosuppressive drugs, initial encounter: Secondary | ICD-10-CM | POA: Diagnosis not present

## 2021-05-28 DIAGNOSIS — D509 Iron deficiency anemia, unspecified: Secondary | ICD-10-CM | POA: Diagnosis not present

## 2021-05-28 DIAGNOSIS — G620 Drug-induced polyneuropathy: Secondary | ICD-10-CM | POA: Diagnosis not present

## 2021-05-28 DIAGNOSIS — C184 Malignant neoplasm of transverse colon: Secondary | ICD-10-CM | POA: Insufficient documentation

## 2021-05-28 DIAGNOSIS — I5022 Chronic systolic (congestive) heart failure: Secondary | ICD-10-CM | POA: Insufficient documentation

## 2021-05-28 DIAGNOSIS — N261 Atrophy of kidney (terminal): Secondary | ICD-10-CM | POA: Diagnosis not present

## 2021-05-28 DIAGNOSIS — Z79899 Other long term (current) drug therapy: Secondary | ICD-10-CM | POA: Insufficient documentation

## 2021-05-28 DIAGNOSIS — J45909 Unspecified asthma, uncomplicated: Secondary | ICD-10-CM | POA: Insufficient documentation

## 2021-05-28 DIAGNOSIS — R630 Anorexia: Secondary | ICD-10-CM | POA: Insufficient documentation

## 2021-05-28 DIAGNOSIS — K5909 Other constipation: Secondary | ICD-10-CM | POA: Diagnosis not present

## 2021-05-28 DIAGNOSIS — Z8616 Personal history of COVID-19: Secondary | ICD-10-CM | POA: Diagnosis not present

## 2021-05-28 LAB — CMP (CANCER CENTER ONLY)
ALT: 9 U/L (ref 0–44)
AST: 12 U/L — ABNORMAL LOW (ref 15–41)
Albumin: 3.9 g/dL (ref 3.5–5.0)
Alkaline Phosphatase: 75 U/L (ref 38–126)
Anion gap: 8 (ref 5–15)
BUN: 19 mg/dL (ref 8–23)
CO2: 25 mmol/L (ref 22–32)
Calcium: 8.4 mg/dL — ABNORMAL LOW (ref 8.9–10.3)
Chloride: 103 mmol/L (ref 98–111)
Creatinine: 1.5 mg/dL — ABNORMAL HIGH (ref 0.61–1.24)
GFR, Estimated: 51 mL/min — ABNORMAL LOW (ref >60)
Glucose, Bld: 77 mg/dL (ref 70–99)
Potassium: 3.7 mmol/L (ref 3.5–5.1)
Sodium: 136 mmol/L (ref 135–145)
Total Bilirubin: 0.4 mg/dL (ref 0.3–1.2)
Total Protein: 7.1 g/dL (ref 6.5–8.1)

## 2021-05-28 LAB — CBC WITH DIFFERENTIAL (CANCER CENTER ONLY)
Abs Immature Granulocytes: 0.01 10*3/uL (ref 0.00–0.07)
Basophils Absolute: 0 10*3/uL (ref 0.0–0.1)
Basophils Relative: 1 %
Eosinophils Absolute: 0.2 10*3/uL (ref 0.0–0.5)
Eosinophils Relative: 4 %
HCT: 39 % (ref 39.0–52.0)
Hemoglobin: 12.7 g/dL — ABNORMAL LOW (ref 13.0–17.0)
Immature Granulocytes: 0 %
Lymphocytes Relative: 40 %
Lymphs Abs: 2 10*3/uL (ref 0.7–4.0)
MCH: 27.7 pg (ref 26.0–34.0)
MCHC: 32.6 g/dL (ref 30.0–36.0)
MCV: 85.2 fL (ref 80.0–100.0)
Monocytes Absolute: 0.4 10*3/uL (ref 0.1–1.0)
Monocytes Relative: 7 %
Neutro Abs: 2.4 10*3/uL (ref 1.7–7.7)
Neutrophils Relative %: 48 %
Platelet Count: 196 10*3/uL (ref 150–400)
RBC: 4.58 MIL/uL (ref 4.22–5.81)
RDW: 15.4 % (ref 11.5–15.5)
WBC Count: 5.1 10*3/uL (ref 4.0–10.5)
nRBC: 0 % (ref 0.0–0.2)

## 2021-05-28 LAB — CEA (ACCESS): CEA (CHCC): 1.45 ng/mL (ref 0.00–5.00)

## 2021-05-30 ENCOUNTER — Other Ambulatory Visit: Payer: Self-pay

## 2021-05-30 ENCOUNTER — Encounter: Payer: Self-pay | Admitting: *Deleted

## 2021-05-30 ENCOUNTER — Inpatient Hospital Stay (HOSPITAL_BASED_OUTPATIENT_CLINIC_OR_DEPARTMENT_OTHER): Payer: Medicare Other | Admitting: Oncology

## 2021-05-30 VITALS — BP 141/68 | HR 58 | Temp 97.7°F | Resp 18 | Wt 155.2 lb

## 2021-05-30 DIAGNOSIS — C184 Malignant neoplasm of transverse colon: Secondary | ICD-10-CM

## 2021-05-30 NOTE — Progress Notes (Signed)
Referral order and chart information sent to [facility], Dr. Emogene Morgan for port removal.

## 2021-05-30 NOTE — Progress Notes (Signed)
[facility] OFFICE PROGRESS NOTE   Diagnosis: Colon cancer  INTERVAL HISTORY:   Ms. Kovacevic returns as scheduled.  He feels well.  He reports chronic constipation.  He has a chronic poor appetite.  No pain.  Objective:  Vital signs in last 24 hours:  Blood pressure (!) 141/68, pulse (!) 58, temperature 97.7 F (36.5 C), temperature source Oral, resp. rate 18, weight 155 lb 3.2 oz (70.4 kg), SpO2 100 %.    Lymphatics: No cervical, supraclavicular, axillary, or inguinal nodes Resp: Left greater than right inspiratory/expiratory wheezes, no respiratory distress Cardio: Regular rate and rhythm GI: No mass, no hepatosplenomegaly, nontender Vascular: No leg edema   Portacath/PICC-without erythema  Lab Results:  Lab Results  Component Value Date   WBC 5.1 05/28/2021   HGB 12.7 (L) 05/28/2021   HCT 39.0 05/28/2021   MCV 85.2 05/28/2021   PLT 196 05/28/2021   NEUTROABS 2.4 05/28/2021    CMP  Lab Results  Component Value Date   NA 136 05/28/2021   K 3.7 05/28/2021   CL 103 05/28/2021   CO2 25 05/28/2021   GLUCOSE 77 05/28/2021   BUN 19 05/28/2021   CREATININE 1.50 (H) 05/28/2021   CALCIUM 8.4 (L) 05/28/2021   PROT 7.1 05/28/2021   ALBUMIN 3.9 05/28/2021   AST 12 (L) 05/28/2021   ALT 9 05/28/2021   ALKPHOS 75 05/28/2021   BILITOT 0.4 05/28/2021   GFRNONAA 51 (L) 05/28/2021   GFRAA >60 02/13/2020    Lab Results  Component Value Date   CEA1 1.30 01/22/2021   CEA 1.45 05/28/2021    Lab Results  Component Value Date   INR 0.9 06/26/2019   LABPROT 12.3 06/26/2019    Imaging:  DG Foot Complete Left  Result Date: 05/28/2021 CLINICAL DATA:  Wound.  Evaluation for osteomyelitis. EXAM: LEFT FOOT - COMPLETE 3+ VIEW COMPARISON:  No recent prior. FINDINGS: Diffuse mild degenerative change, most prominent about the first MTP joint. No acute or focal bony abnormality identified. No bony erosions noted. No radiopaque foreign bodies. IMPRESSION:  Diffuse mild degenerative change, most prominent about the first MTP joint. No acute or focal bony abnormality. No bony erosions noted. Electronically Signed   By: Marcello Moores  Register M.D.   On: 05/28/2021 08:32    Medications: I have reviewed the patient's current medications.   Assessment/Plan: Colon cancer, transverse colon, stage IIIc, T4bN2, status post a partial transverse colectomy 06/30/2019 Grade 3, lymphovascular and perineural invasion present, 4/18 lymph nodes positive, positive lymph nodes mesenteric margin, tumor invades into adherent omentum, MSI high, loss of MLH1 and PMS2 expression; MLH1 methylation not detected; BRAF mutation analysis-negative CT abdomen/pelvis 06/26/2019-thickening within the distal transverse colon, severely atrophic and calcified right kidney CT chest 06/28/2019-no evidence of metastatic disease, stable 3 mm right lower lobe nodule Cycle 1 FOLFOX 08/04/2019 (oxaliplatin 65 mg/m due to renal function) Plan for repeat CTs after he has completed 5 cycles of chemotherapy Cycle 2 FOLFOX 08/18/2019 Cycle 3 FOLFOX 08/31/2019, Udenyca added Cycle 4 FOLFOX 10/12/2019 Cycle 5 FOLFOX 10/25/2019 Stable right lower lobe nodule on chest CT 09/21/2019 CT abdomen/pelvis 11/07/2019-postoperative changes related to colectomy and anastomosis.  No current evidence of disease.  No adenopathy. Cycle 6 FOLFOX 11/08/2019 Cycle 7 FOLFOX 11/23/2019 Cycle 8 FOLFOX 12/06/2019 Cycle 9 FOLFOX 12/20/2019 Case presented GI tumor conference 12/28/2018-tumor margin negative, positive lymph node at margin; no recommendation for further surgery or radiation; peripancreatic node on baseline CT not seen on April 2021 CT Cycle 10 FOLFOX  01/03/2020  Cycle 11 FOLFOX 01/17/2020-oxaliplatin and Udenyca held Cycle 12 FOLFOX 01/31/2020-oxaliplatin and Udenyca held, 5-FU dose reduced secondary to mucositis 05/25/2020 colonoscopy-internal hemorrhoids, repeat colonoscopy in 3 years for surveillance CTs 06/11/2020-new  trace right pelvic fluid, nonspecific; new mild left upper lobe interstitial/groundglass opacity suspicious for minimal infection or inflammation; similar 2 mm right lower lobe pulmonary nodule CTs 05/23/2021-no evidence of recurrent colon cancer Asthma Chronic systolic heart failure History of PVCs Atrophic right kidney Microcytic anemia, likely iron deficiency anemia secondary to #1 Port-A-Cath placement on 12/15/2019, Dr. 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 Wickliff is in clinical remission from colon cancer.  He is now almost 2 years out from diagnosis.  He will be referred for Port-A-Cath removal.  Mr. Casanova will return for an office visit and CEA in 6 months.  Betsy Coder, MD  05/30/2021  9:06 AM

## 2021-07-31 ENCOUNTER — Other Ambulatory Visit: Payer: Self-pay

## 2021-07-31 ENCOUNTER — Ambulatory Visit (INDEPENDENT_AMBULATORY_CARE_PROVIDER_SITE_OTHER): Payer: Commercial Managed Care - HMO | Admitting: Cardiovascular Disease

## 2021-07-31 ENCOUNTER — Encounter: Payer: Self-pay | Admitting: Oncology

## 2021-07-31 ENCOUNTER — Encounter: Payer: Self-pay | Admitting: Cardiovascular Disease

## 2021-07-31 VITALS — BP 158/72 | HR 59 | Ht 71.0 in | Wt 153.4 lb

## 2021-07-31 DIAGNOSIS — I5042 Chronic combined systolic (congestive) and diastolic (congestive) heart failure: Secondary | ICD-10-CM | POA: Diagnosis not present

## 2021-07-31 DIAGNOSIS — I10 Essential (primary) hypertension: Secondary | ICD-10-CM

## 2021-07-31 DIAGNOSIS — I5022 Chronic systolic (congestive) heart failure: Secondary | ICD-10-CM

## 2021-07-31 NOTE — Assessment & Plan Note (Signed)
History of essential hypertension a blood pressure measured today at 158/72.  He is on amlodipine, carvedilol and losartan.

## 2021-07-31 NOTE — Assessment & Plan Note (Signed)
Joe Garcia has had nonischemic cardiomyopathy in the past.  I last saw him 10/04/2018 when he came in with chest pain, anterolateral T wave inversion and positive enzymes.  Cardiac catheterization performed by Dr. Irish Lack revealed clean coronary arteries with wall motion consistent with "Takotsubo syndrome".  His EF ultimately improved to normal by 2D echo performed 09/20/2020 on guideline directed optimal medical therapy.

## 2021-07-31 NOTE — Assessment & Plan Note (Signed)
Remote history of ethanol abuse

## 2021-07-31 NOTE — Patient Instructions (Signed)

## 2021-07-31 NOTE — Progress Notes (Signed)
07/31/2021 Joe Garcia   [birth year]  [id number]  Primary Physician Medicine, [facility] Primary Cardiologist: Lorretta Harp MD Joe Garcia, [place]  HPI:  Joe Garcia is a 71 y.o. thin appearing single African-American male father of a children, grandfather to 7 grandchildren who is retired from Paediatric nurse work.  I last saw him in the hospital 10/04/2018 when he was admitted with a non-STEMI.  He underwent cardiac catheterization by Dr. Irish Garcia revealing clean coronary arteries and LV dysfunction consistent with Takotsubo syndrome.  He had had nonischemic cardiomyopathy in the past and was followed by Dr. Haroldine Garcia.  His most recent 2D echo performed 09/20/2020 revealed normalization of his LV function.  Other problems include treated hypertension.  He smoked remotely as well as drank alcohol remotely all of which she stopped.  He currently is asymptomatic and denies chest pain or shortness of breath.   Current Meds  Medication Sig   albuterol (VENTOLIN HFA) 108 (90 Base) MCG/ACT inhaler Inhale 2 puffs into the lungs every 4 (four) hours as needed for wheezing or shortness of breath.   amiodarone (PACERONE) 100 MG tablet Take 1 tablet (100 mg total) by mouth daily.   amLODipine (NORVASC) 5 MG tablet Take 1 tablet (5 mg total) by mouth every evening.   budesonide-formoterol (SYMBICORT) 160-4.5 MCG/ACT inhaler Inhale 2 puffs into the lungs 2 (two) times daily.   carvedilol (COREG) 3.125 MG tablet Take 1 tablet (3.125 mg total) by mouth 2 (two) times daily with a meal.   doxycycline (VIBRA-TABS) 100 MG tablet Take 100 mg by mouth 2 (two) times daily.   fluticasone (FLONASE) 50 MCG/ACT nasal spray Place 2 sprays into both nostrils daily as needed for allergies or rhinitis.   furosemide (LASIX) 20 MG tablet Take 1 tablet (20 mg total) by mouth daily.   isosorbide-hydrALAZINE (BIDIL) 20-37.5 MG tablet TAKE 1 TABLET BY MOUTH 3 (THREE) TIMES DAILY  (AM+NOON+BEDTIME)   losartan (COZAAR) 50 MG tablet Take 1 tablet (50 mg total) by mouth daily.   metoCLOPramide (REGLAN) 5 MG tablet Take 1 tablet (5 mg total) by mouth 3 (three) times daily before meals.   montelukast (SINGULAIR) 10 MG tablet Take 10 mg by mouth at bedtime.   omeprazole (PRILOSEC) 40 MG capsule Take 40 mg by mouth in the morning and at bedtime.     Allergies  Allergen Reactions   Clarithromycin Shortness Of Breath   Lisinopril Swelling    Facial and tongue swelling 10/2012    Social History   Socioeconomic History   Marital status: Single    Spouse name: Not on file   Number of children: Not on file   Years of education: Not on file   Highest education level: Not on file  Occupational History   Occupation: "it don't work for me", on disability  Tobacco Use   Smoking status: Former    Packs/day: 0.10    Years: 45.00    Pack years: 4.50    Types: Cigarettes    Quit date: 2012    Years since quitting: 11.0   Smokeless tobacco: Never  Vaping Use   Vaping Use: Never used  Substance and Sexual Activity   Alcohol use: No    Comment: Pt states no etoh since April 2014   Drug use: Not Currently    Comment: Prior use of crack cocaine, quit 2012   Sexual activity: Never  Other Topics Concern   Not on file  Social  History Narrative   Not on file   Social Determinants of Health   Financial Resource Strain: Not on file  Food Insecurity: Not on file  Transportation Needs: Not on file  Physical Activity: Not on file  Stress: Not on file  Social Connections: Not on file  Intimate Partner Violence: Not on file     Review of Systems: General: negative for chills, fever, night sweats or weight changes.  Cardiovascular: negative for chest pain, dyspnea on exertion, edema, orthopnea, palpitations, paroxysmal nocturnal dyspnea or shortness of breath Dermatological: negative for rash Respiratory: negative for cough or wheezing Urologic: negative for  hematuria Abdominal: negative for nausea, vomiting, diarrhea, bright red blood per rectum, melena, or hematemesis Neurologic: negative for visual changes, syncope, or dizziness All other systems reviewed and are otherwise negative except as noted above.    Blood pressure (!) 158/72, pulse (!) 59, height 5\' 11"  (1.803 m), weight 153 lb 6.4 oz (69.6 kg), SpO2 98 %.  General appearance: alert and no distress Neck: no adenopathy, no carotid bruit, no JVD, supple, symmetrical, trachea midline, and thyroid not enlarged, symmetric, no tenderness/mass/nodules Lungs: clear to auscultation bilaterally Heart: regular rate and rhythm, S1, S2 normal, no murmur, click, rub or gallop Extremities: extremities normal, atraumatic, no cyanosis or edema Pulses: 2+ and symmetric Skin: Skin color, texture, turgor normal. No rashes or lesions Neurologic: Grossly normal  EKG sinus bradycardia 59 without ST or T wave changes.  I personally reviewed this EKG.  ASSESSMENT AND PLAN:   Essential hypertension History of essential hypertension a blood pressure measured today at 158/72.  He is on amlodipine, carvedilol and losartan.  Alcohol abuse Remote history of ethanol abuse  Chronic combined systolic (congestive) and diastolic (congestive) heart failure [facility]) [name] has had nonischemic cardiomyopathy in the past.  I last saw him 10/04/2018 when he came in with chest pain, anterolateral T wave inversion and positive enzymes.  Cardiac catheterization performed by Dr. Irish Garcia revealed clean coronary arteries with wall motion consistent with "Takotsubo syndrome".  His EF ultimately improved to normal by 2D echo performed 09/20/2020 on guideline directed optimal medical therapy.     Lorretta Harp MD FACP,FACC,FAHA, [facility] 07/31/2021 3:35 PM

## 2021-08-15 ENCOUNTER — Telehealth: Payer: Self-pay

## 2021-08-15 ENCOUNTER — Other Ambulatory Visit (HOSPITAL_COMMUNITY): Payer: Self-pay | Admitting: Internal Medicine

## 2021-08-15 NOTE — Telephone Encounter (Signed)
Called and spoke with the patient regarding his voicemail. The patient want to spoke with the nurse about his port flush. Patient port will be removal on 09/09/21. Patient does not need to have a flush at this time. Patient voiced understanding.

## 2021-08-16 NOTE — Telephone Encounter (Signed)
This is Dr. Berry's pt 

## 2021-09-04 ENCOUNTER — Telehealth: Payer: Self-pay | Admitting: *Deleted

## 2021-09-04 NOTE — Telephone Encounter (Signed)
Called patient to f/u on port removal. He reports it is being removed on Monday 09/09/21.

## 2021-09-11 ENCOUNTER — Other Ambulatory Visit (HOSPITAL_COMMUNITY): Payer: Self-pay | Admitting: Internal Medicine

## 2021-09-11 NOTE — Telephone Encounter (Signed)
This is Dr. Berry's pt 

## 2021-10-20 IMAGING — CT CT ABD-PELV W/ CM
2 of 5 series · 16 of 46 positions shown, 18 images · IV contrast (Omni 300)
Comparison: 10/03/2018

CLINICAL DATA: Generalized abdominal pain

EXAM:
CT ABDOMEN AND PELVIS WITH CONTRAST
TECHNIQUE: Multidetector CT imaging of the abdomen and pelvis was performed
using the standard protocol following bolus administration of
intravenous contrast.
CONTRAST:  100mL OMNIPAQUE IOHEXOL 300 MG/ML  SOLN

[Series 3: a/p w/ 5mm · axial · 0.84mm/px · z∈[-568,-148]mm · 13 of 96 slices shown, 15 images]
[im 6/96  soft-tissue]
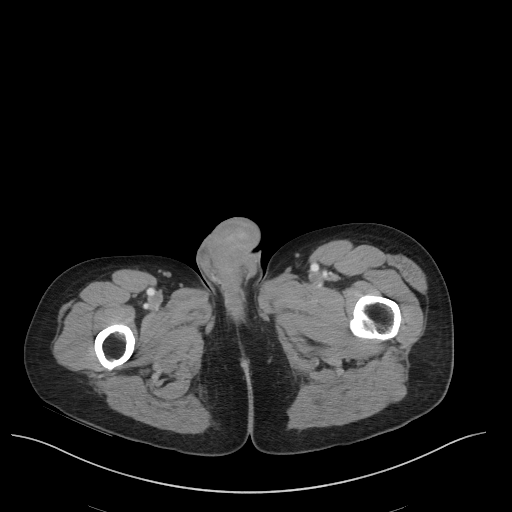
[im 6/96  bone]
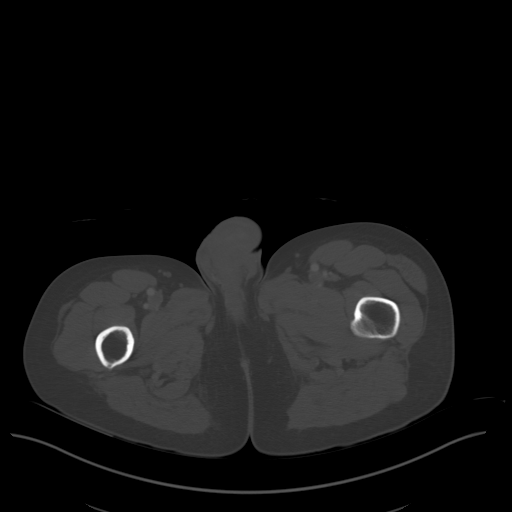
[im 11/96  soft-tissue]
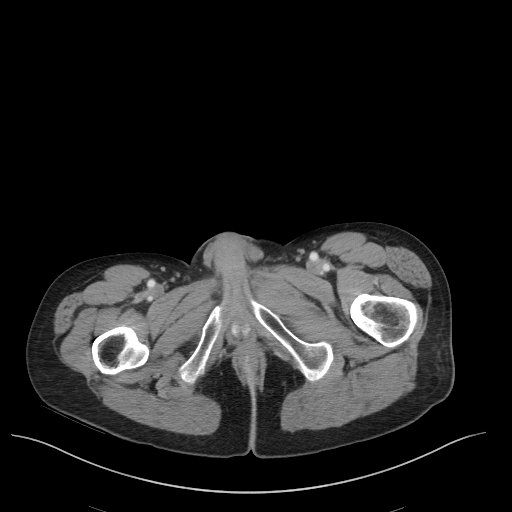
[im 22/96  soft-tissue]
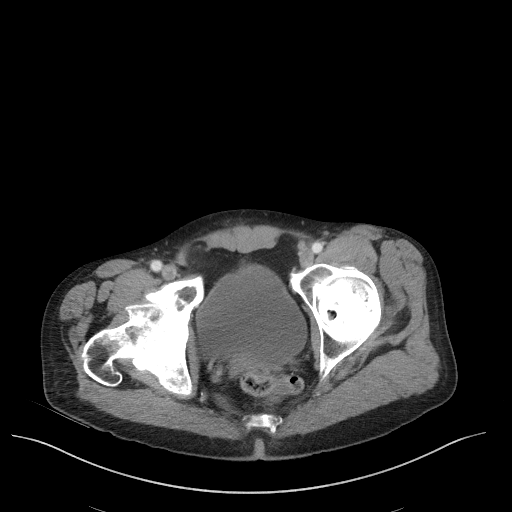
[im 27/96  soft-tissue]
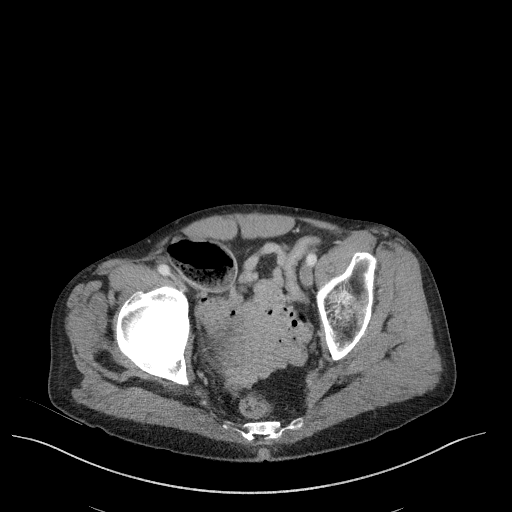
[im 32/96  soft-tissue]
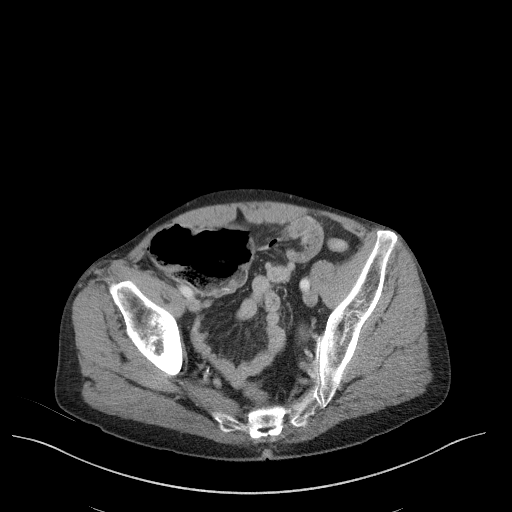
[im 43/96  soft-tissue]
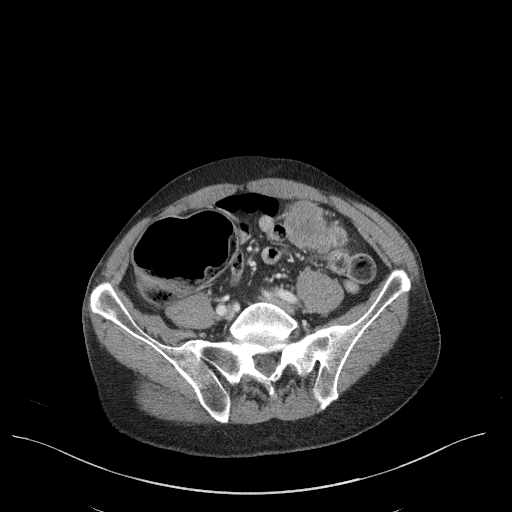
[im 48/96  soft-tissue]
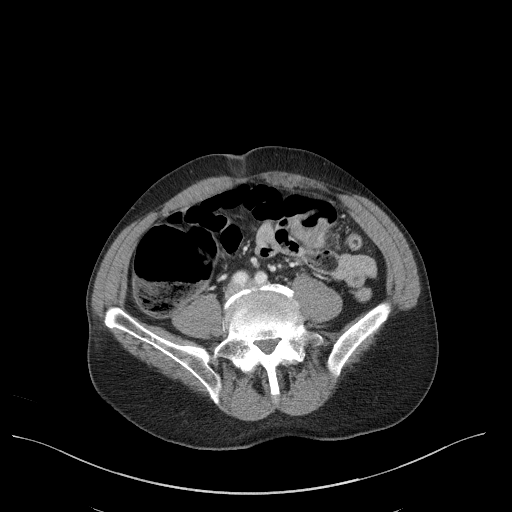
[im 53/96  soft-tissue]
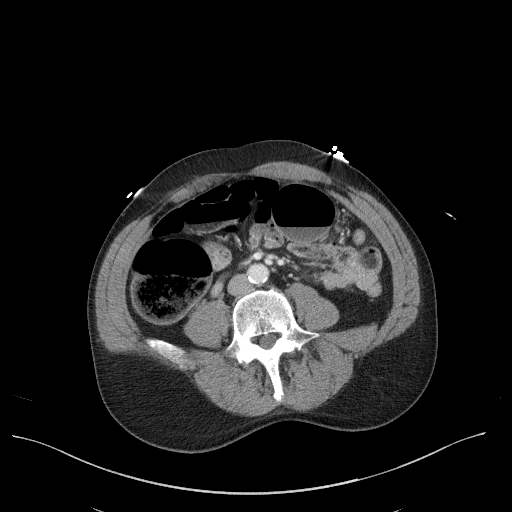
[im 64/96  soft-tissue]
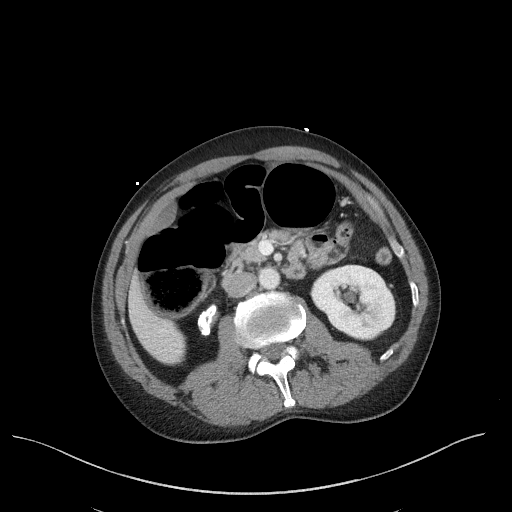
[im 64/96  bone]
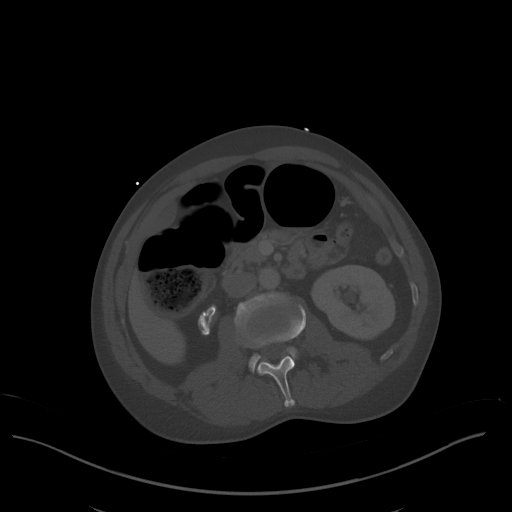
[im 69/96  soft-tissue]
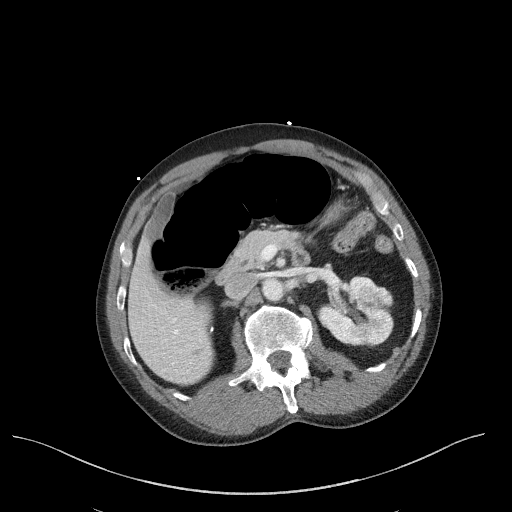
[im 74/96  soft-tissue]
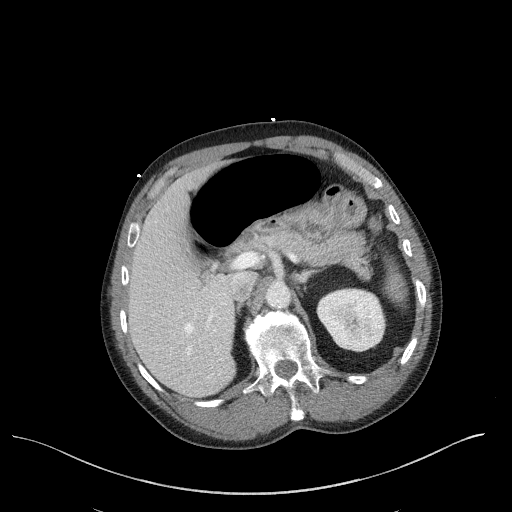
[im 85/96  soft-tissue]
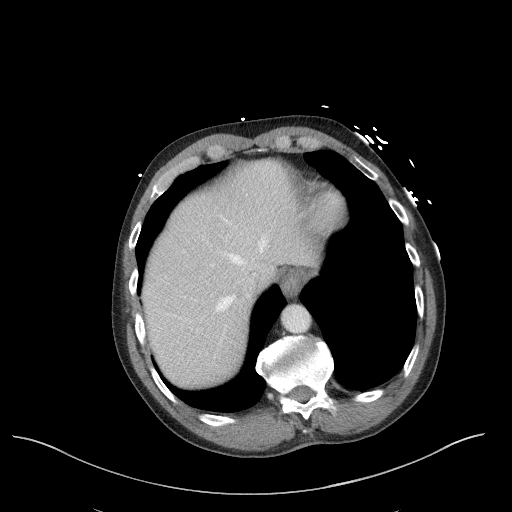
[im 90/96  soft-tissue]
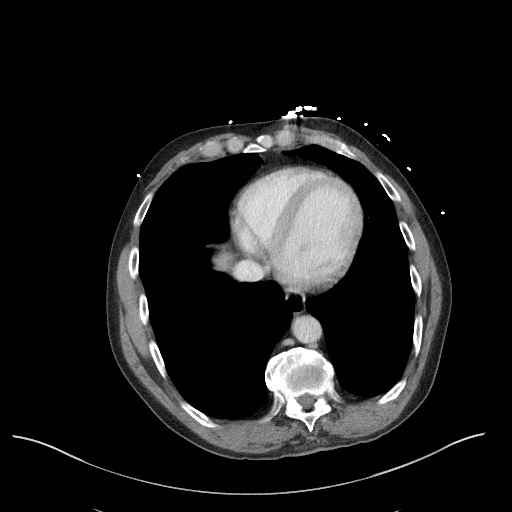

[Series 6: a/p w/ cor · coronal · 0.68mm/px · 3 of 145 slices shown]
[im 49/145  soft-tissue]
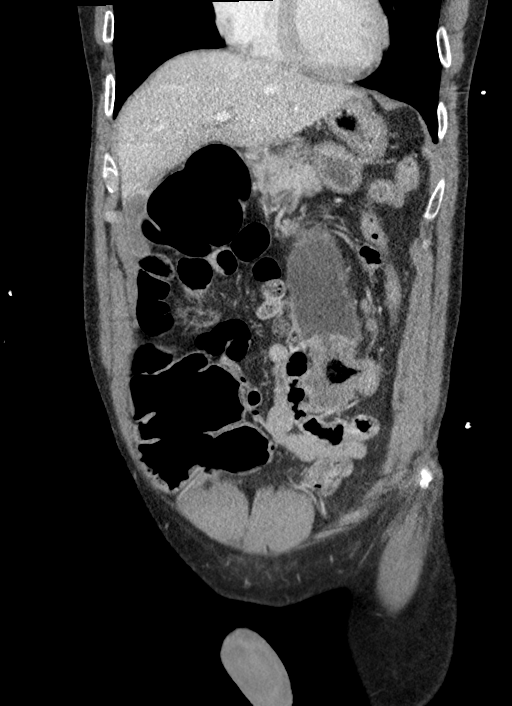
[im 65/145  soft-tissue]
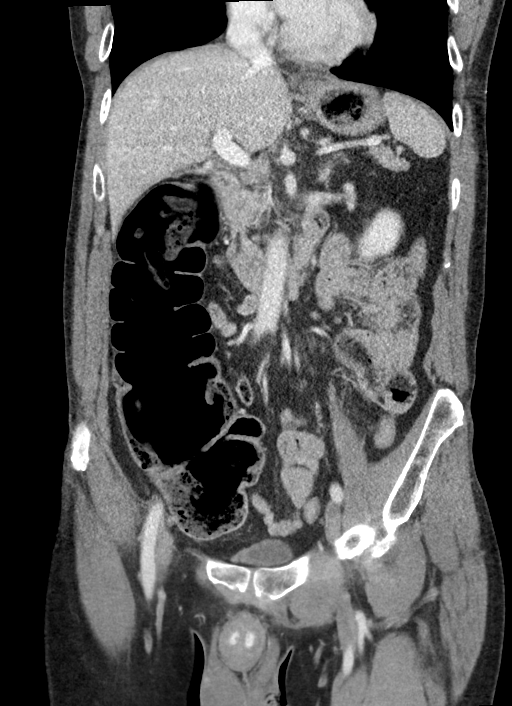
[im 81/145  soft-tissue]
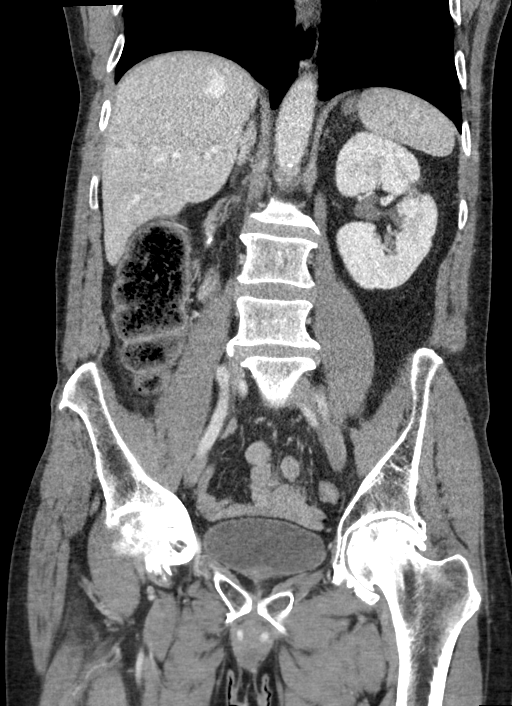

[16 of 46 positions shown; findings below may reference images not displayed]

FINDINGS: Lower chest: Lung bases are clear. No effusions. Heart is normal
size.

Hepatobiliary: No focal hepatic abnormality. Gallbladder
unremarkable.

Pancreas: No focal abnormality or ductal dilatation.

Spleen: No focal abnormality.  Normal size.

Adrenals/Urinary Tract: No renal or adrenal mass. Right kidney is
severely atrophic and calcified. No hydronephrosis. Urinary bladder
unremarkable.

Stomach/Bowel: There is an abnormal segment of colon in the distal
transverse colon with marked wall thickening. Colon proximal to this
is dilated. Appearance is concerning for apple core annular colon
cancer. Small bowel is decompressed. Stomach is unremarkable.

Vascular/Lymphatic: No evidence of aneurysm or adenopathy. Scattered
calcifications.

Reproductive: No visible focal abnormality.

Other: No free fluid or free air.

Musculoskeletal: Degenerative changes in the hips. No acute bony
abnormality.
IMPRESSION: Circumferential area of short segment wall thickening within the
distal transverse colon extending for approximately 6 cm with
dilated, distended colon proximal to this level. Appearance is
concerning for annular apple core malignancy.

This appears partially obstructive.

Aortic atherosclerosis.

Severely atrophic, calcified right kidney.

## 2021-10-20 IMAGING — DX DG CHEST 1V PORT
1 series · 1 of 1 positions shown · non-contrast
Comparison: 06/20/2019

CLINICAL DATA: Nausea and vomiting.

EXAM:
PORTABLE CHEST 1 VIEW

[chest ap]
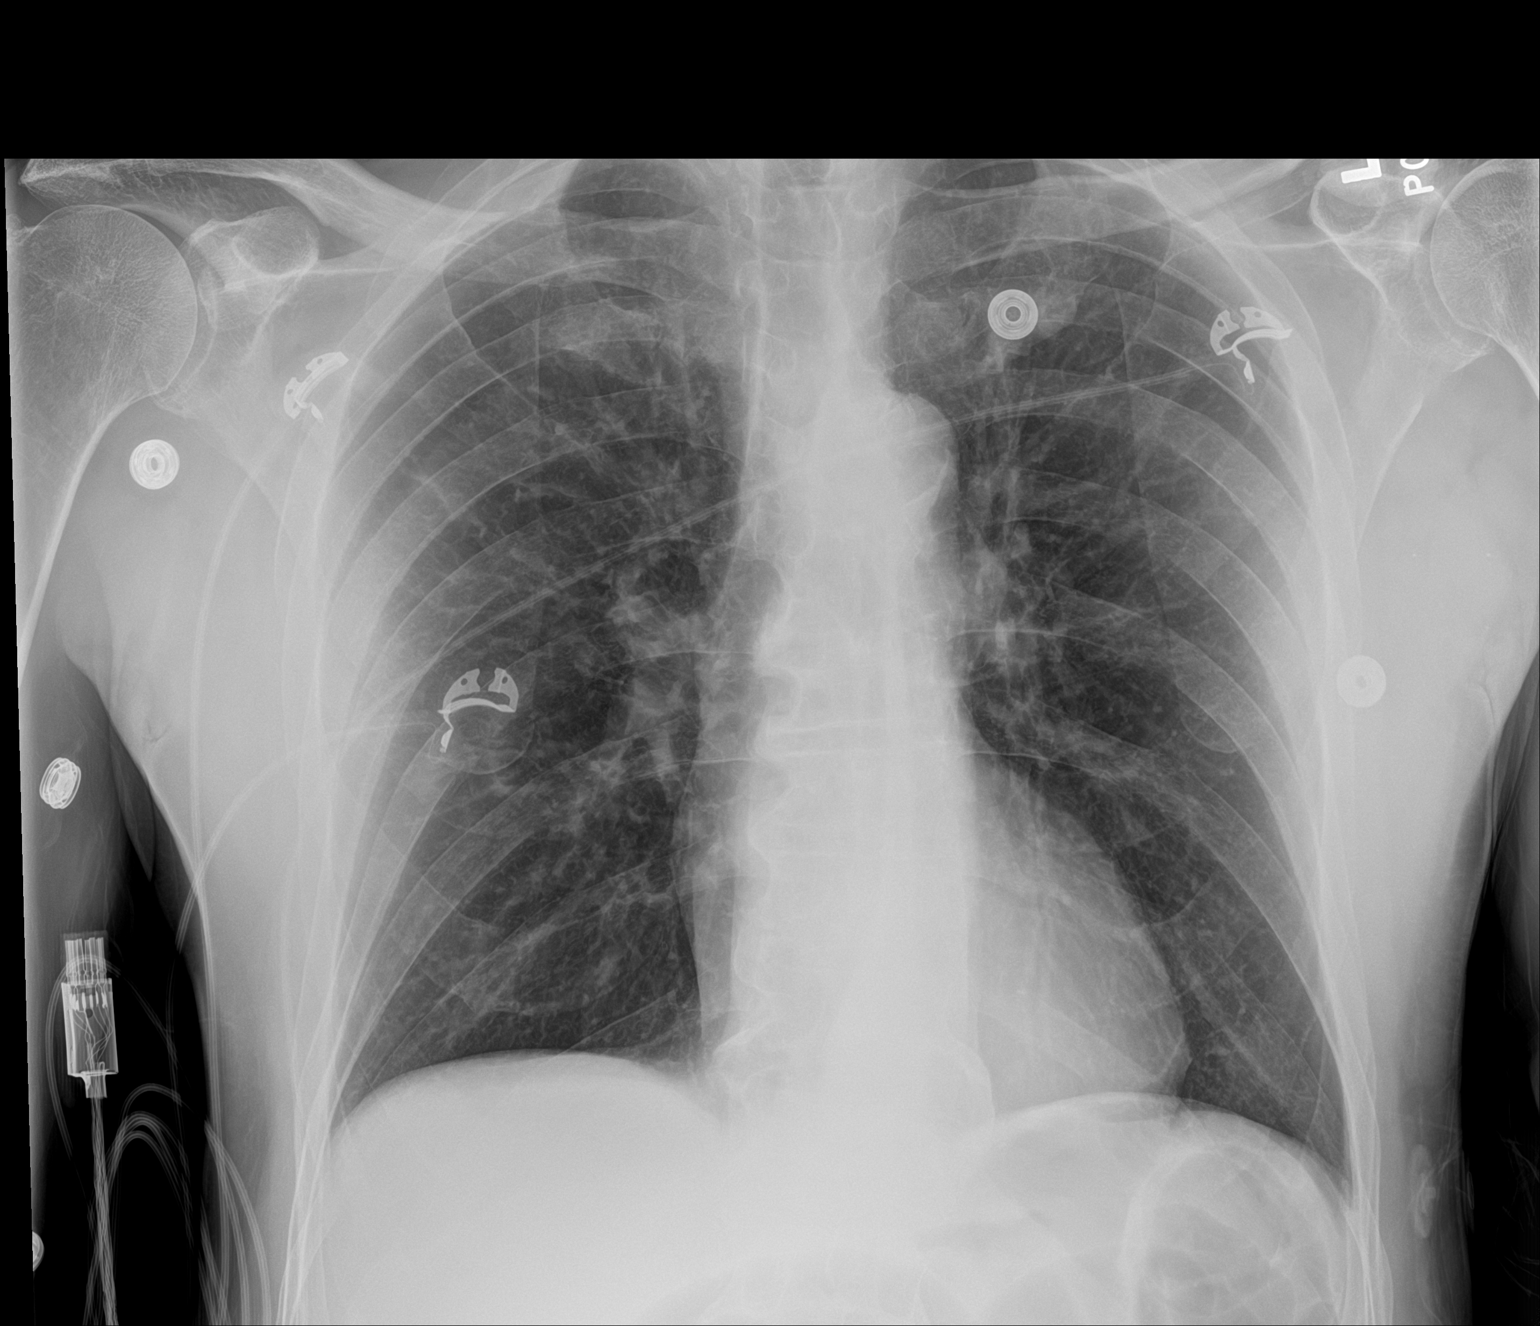

[1 of 1 positions shown; findings below may reference images not displayed]

FINDINGS: The cardiomediastinal silhouette is unchanged with normal heart
size. No airspace consolidation, edema, pleural effusion,
pneumothorax is identified. Aortic atherosclerosis and thoracic
spondylosis are noted.
IMPRESSION: No active disease.

## 2021-11-20 ENCOUNTER — Encounter: Payer: Self-pay | Admitting: Oncology

## 2021-11-26 ENCOUNTER — Other Ambulatory Visit: Payer: Self-pay | Admitting: Medical

## 2021-11-26 ENCOUNTER — Other Ambulatory Visit: Payer: Self-pay | Admitting: Cardiovascular Disease

## 2021-11-27 ENCOUNTER — Inpatient Hospital Stay: Payer: Medicare Other

## 2021-11-27 ENCOUNTER — Inpatient Hospital Stay: Payer: Medicare Other | Attending: Nurse Practitioner | Admitting: Nurse Practitioner

## 2021-11-27 ENCOUNTER — Encounter: Payer: Self-pay | Admitting: Nurse Practitioner

## 2021-11-27 VITALS — BP 122/74 | HR 63 | Temp 98.2°F | Resp 18 | Ht 71.0 in | Wt 158.2 lb

## 2021-11-27 DIAGNOSIS — Z79899 Other long term (current) drug therapy: Secondary | ICD-10-CM | POA: Diagnosis not present

## 2021-11-27 DIAGNOSIS — N261 Atrophy of kidney (terminal): Secondary | ICD-10-CM | POA: Diagnosis not present

## 2021-11-27 DIAGNOSIS — Z8679 Personal history of other diseases of the circulatory system: Secondary | ICD-10-CM | POA: Diagnosis not present

## 2021-11-27 DIAGNOSIS — G620 Drug-induced polyneuropathy: Secondary | ICD-10-CM | POA: Diagnosis not present

## 2021-11-27 DIAGNOSIS — D509 Iron deficiency anemia, unspecified: Secondary | ICD-10-CM | POA: Insufficient documentation

## 2021-11-27 DIAGNOSIS — T451X5A Adverse effect of antineoplastic and immunosuppressive drugs, initial encounter: Secondary | ICD-10-CM | POA: Diagnosis not present

## 2021-11-27 DIAGNOSIS — J45909 Unspecified asthma, uncomplicated: Secondary | ICD-10-CM | POA: Insufficient documentation

## 2021-11-27 DIAGNOSIS — C184 Malignant neoplasm of transverse colon: Secondary | ICD-10-CM | POA: Diagnosis present

## 2021-11-27 DIAGNOSIS — I5022 Chronic systolic (congestive) heart failure: Secondary | ICD-10-CM | POA: Diagnosis not present

## 2021-11-27 LAB — CEA (ACCESS): CEA (CHCC): 1.98 ng/mL (ref 0.00–5.00)

## 2021-11-27 NOTE — Progress Notes (Signed)
?  [facility] ?OFFICE PROGRESS NOTE ? ? ?Diagnosis: Colon cancer ? ?INTERVAL HISTORY:  ? ?Joe Garcia returns as scheduled.  He feels well.  No change in bowel habits.  No rectal bleeding.  No abdominal pain.  No nausea/vomiting.  Stable appetite.  Mild numbness in the feet. ? ?Objective: ? ?Vital signs in last 24 hours: ? ?Blood pressure 122/74, pulse 63, temperature 98.2 ?F (36.8 ?C), temperature source Oral, resp. rate 18, height _0  (1.803 m), weight 158 lb 3.2 oz (71.8 kg), SpO2 100 %. ?  ?Lymphatics: No palpable cervical, supraclavicular, axillary or inguinal lymph nodes. ?Resp: Lungs clear bilaterally. ?Cardio: Regular rate and rhythm. ?GI: Abdomen soft and nontender.  No hepatomegaly.  No mass. ?Vascular: No leg edema. ?Port-A-Cath without erythema. ? ?Lab Results: ? ?Lab Results  ?Component Value Date  ? WBC 5.1 05/28/2021  ? HGB 12.7 (L) 05/28/2021  ? HCT 39.0 05/28/2021  ? MCV 85.2 05/28/2021  ? PLT 196 05/28/2021  ? NEUTROABS 2.4 05/28/2021  ? ? ?Imaging: ? ?No results found. ? ?Medications: I have reviewed the patient's current medications. ? 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 due to renal function) ?Plan for repeat CTs after he has completed 5 cycles of chemotherapy ?Cycle 2 FOLFOX 08/18/2019 ?Cycle 3 FOLFOX 08/31/2019, Udenyca added ?Cycle 4 FOLFOX 10/12/2019 ?Cycle 5 FOLFOX  10/25/2019 ?Stable right lower lobe nodule on chest CT 09/21/2019 ?CT abdomen/pelvis 11/07/2019-postoperative changes related to colectomy and anastomosis.  No current evidence of disease.  No adenopathy. ?Cycle 6 FOLFOX 11/08/2019 ?Cycle 7 FOLFOX 11/23/2019 ?Cycle 8 FOLFOX 12/06/2019 ?Cycle 9 FOLFOX 12/20/2019 ?Case presented GI tumor conference 12/28/2018-tumor margin negative, positive lymph node at margin; no recommendation for further surgery or radiation; peripancreatic node on baseline CT not seen on April 2021 CT ?Cycle 10 FOLFOX 01/03/2020  ?Cycle 11 FOLFOX 01/17/2020-oxaliplatin and Udenyca held ?Cycle 12 FOLFOX 01/31/2020-oxaliplatin and Udenyca held, 5-FU dose reduced secondary to mucositis ?05/25/2020 colonoscopy-internal hemorrhoids, repeat colonoscopy in 3 years for surveillance ?CTs 06/11/2020-new trace right pelvic fluid, nonspecific; new mild left upper lobe interstitial/groundglass opacity suspicious for minimal infection or inflammation; similar 2 mm right lower lobe pulmonary nodule ?CTs 05/23/2021-no evidence of recurrent colon cancer ?Asthma ?Chronic systolic heart failure ?History of PVCs ?Atrophic right kidney ?Microcytic anemia, likely iron deficiency anemia secondary to #1 ?Port-A-Cath placement on 12/15/2019, Dr. 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  ? ?Disposition: Mr. Irion appears stable.  He remains in clinical remission from colon cancer.  We will follow-up on the CEA from today.  Plan for surveillance CT scans in 6 months.  We will see him in follow-up a few days later. ? ? ? ?[name] ANP/GNP-BC  ? ?11/27/2021  ?9:08 AM ? ? ? ? ? ? ? ?

## 2021-12-23 ENCOUNTER — Other Ambulatory Visit: Payer: Self-pay

## 2021-12-23 ENCOUNTER — Emergency Department (HOSPITAL_COMMUNITY)
Admission: EM | Admit: 2021-12-23 | Discharge: 2021-12-23 | Disposition: A | Discharge status: Home / Self Care | Payer: Medicare Other | Attending: Emergency Medicine | Admitting: Emergency Medicine

## 2021-12-23 ENCOUNTER — Encounter (HOSPITAL_COMMUNITY): Payer: Self-pay | Admitting: Emergency Medicine

## 2021-12-23 DIAGNOSIS — R1013 Epigastric pain: Secondary | ICD-10-CM | POA: Diagnosis not present

## 2021-12-23 DIAGNOSIS — I129 Hypertensive chronic kidney disease with stage 1 through stage 4 chronic kidney disease, or unspecified chronic kidney disease: Secondary | ICD-10-CM | POA: Diagnosis not present

## 2021-12-23 DIAGNOSIS — Z79899 Other long term (current) drug therapy: Secondary | ICD-10-CM | POA: Insufficient documentation

## 2021-12-23 DIAGNOSIS — N1831 Chronic kidney disease, stage 3a: Secondary | ICD-10-CM | POA: Diagnosis not present

## 2021-12-23 DIAGNOSIS — R1010 Upper abdominal pain, unspecified: Secondary | ICD-10-CM | POA: Diagnosis present

## 2021-12-23 DIAGNOSIS — I10 Essential (primary) hypertension: Secondary | ICD-10-CM

## 2021-12-23 LAB — COMPREHENSIVE METABOLIC PANEL
ALT: 21 U/L (ref 0–44)
AST: 20 U/L (ref 15–41)
Albumin: 3.8 g/dL (ref 3.5–5.0)
Alkaline Phosphatase: 96 U/L (ref 38–126)
Anion gap: 7 (ref 5–15)
BUN: 19 mg/dL (ref 8–23)
CO2: 21 mmol/L — ABNORMAL LOW (ref 22–32)
Calcium: 9.2 mg/dL (ref 8.9–10.3)
Chloride: 111 mmol/L (ref 98–111)
Creatinine, Ser: 1.54 mg/dL — ABNORMAL HIGH (ref 0.61–1.24)
GFR, Estimated: 49 mL/min — ABNORMAL LOW (ref >60)
Glucose, Bld: 99 mg/dL (ref 70–99)
Potassium: 3.9 mmol/L (ref 3.5–5.1)
Sodium: 139 mmol/L (ref 135–145)
Total Bilirubin: 0.1 mg/dL — ABNORMAL LOW (ref 0.3–1.2)
Total Protein: 7 g/dL (ref 6.5–8.1)

## 2021-12-23 LAB — CBC WITH DIFFERENTIAL/PLATELET
Abs Immature Granulocytes: 0.01 10*3/uL (ref 0.00–0.07)
Basophils Absolute: 0 10*3/uL (ref 0.0–0.1)
Basophils Relative: 0 %
Eosinophils Absolute: 0.1 10*3/uL (ref 0.0–0.5)
Eosinophils Relative: 2 %
HCT: 44.2 % (ref 39.0–52.0)
Hemoglobin: 14 g/dL (ref 13.0–17.0)
Immature Granulocytes: 0 %
Lymphocytes Relative: 34 %
Lymphs Abs: 2.2 10*3/uL (ref 0.7–4.0)
MCH: 27.9 pg (ref 26.0–34.0)
MCHC: 31.7 g/dL (ref 30.0–36.0)
MCV: 88 fL (ref 80.0–100.0)
Monocytes Absolute: 0.4 10*3/uL (ref 0.1–1.0)
Monocytes Relative: 6 %
Neutro Abs: 3.8 10*3/uL (ref 1.7–7.7)
Neutrophils Relative %: 58 %
Platelets: 212 10*3/uL (ref 150–400)
RBC: 5.02 MIL/uL (ref 4.22–5.81)
RDW: 15.9 % — ABNORMAL HIGH (ref 11.5–15.5)
WBC: 6.6 10*3/uL (ref 4.0–10.5)
nRBC: 0 % (ref 0.0–0.2)

## 2021-12-23 LAB — URINALYSIS, ROUTINE W REFLEX MICROSCOPIC
Bilirubin Urine: NEGATIVE
Glucose, UA: NEGATIVE mg/dL
Hgb urine dipstick: NEGATIVE
Ketones, ur: NEGATIVE mg/dL
Leukocytes,Ua: NEGATIVE
Nitrite: NEGATIVE
Protein, ur: NEGATIVE mg/dL
Specific Gravity, Urine: 1.019 (ref 1.005–1.030)
pH: 5 (ref 5.0–8.0)

## 2021-12-23 LAB — LIPASE, BLOOD: Lipase: 27 U/L (ref 11–51)

## 2021-12-23 MED ORDER — ALUM & MAG HYDROXIDE-SIMETH 200-200-20 MG/5ML PO SUSP
30.0000 mL | Freq: Once | ORAL | Status: AC
  Administered 2021-12-23: 30 mL via ORAL
  Filled 2021-12-23: qty 30

## 2021-12-23 MED ORDER — OMEPRAZOLE 40 MG PO CPDR: 40.0000 mg | DELAYED_RELEASE_CAPSULE | Freq: Every day | ORAL | 0 refills | Status: DC

## 2021-12-23 MED ORDER — FAMOTIDINE 20 MG PO TABS
20.0000 mg | ORAL_TABLET | Freq: Once | ORAL | Status: AC
  Administered 2021-12-23: 20 mg via ORAL
  Filled 2021-12-23: qty 1

## 2021-12-23 NOTE — ED Provider Notes (Signed)
[facility] EMERGENCY DEPARTMENT Provider Note   CSN: [id number] Arrival date & time: 12/23/21  2022     History  Chief Complaint  Patient presents with   Abdominal Pain    Joe Garcia is a 71 y.o. male.  Pt c/o upper abd pain. Symptoms acute onset this evening, moderate, dull. Pt indicates felt as if due to gas, but then was burping a lot, and now notes resolution of his symptoms. No vomiting. No abd distension. Indicates had normal bm today. No dysuria or gu c/o. No back or flank pain. Denies chest pain or discomfort. No sob or unusual doe. No fever or chills.   The history is provided by the patient and medical records.  Abdominal Pain Associated symptoms: no chest pain, no chills, no cough, no diarrhea, no dysuria, no fever, no shortness of breath, no sore throat and no vomiting       Home Medications Prior to Admission medications   Medication Sig Start Date End Date Taking? Authorizing Provider  albuterol (VENTOLIN HFA) 108 (90 Base) MCG/ACT inhaler Inhale 2 puffs into the lungs every 4 (four) hours as needed for wheezing or shortness of breath. 07/09/20   Rodriguez-Southworth, Sunday Spillers, PA-C  amiodarone (PACERONE) 100 MG tablet Take 1 tablet (100 mg total) by mouth daily. 08/29/20   Bensimhon, Shaune Pascal, MD  amiodarone (PACERONE) 200 MG tablet TAKE 1/2 TABLET (100 MG TOTAL) BY MOUTH DAILY. 08/16/21   Lorretta Harp, MD  amLODipine (NORVASC) 5 MG tablet TAKE 1 TABLET (5 MG TOTAL) BY MOUTH DAILY (AM) 11/26/21   Lorretta Harp, MD  budesonide-formoterol [facility]) 160-4.5 MCG/ACT inhaler Inhale 2 puffs into the lungs 2 (two) times daily. 11/23/18   Tanda Rockers, MD  carvedilol (COREG) 3.125 MG tablet TAKE 1 TABLET (3.125 MG TOTAL) BY MOUTH 2 (TWO) TIMES DAILY WITH A MEAL. 11/26/21   Lorretta Harp, MD  doxycycline (VIBRA-TABS) 100 MG tablet Take 100 mg by mouth 2 (two) times daily. 05/24/21   [provider]  fluticasone (FLONASE) 50 MCG/ACT  nasal spray Place 2 sprays into both nostrils daily as needed for allergies or rhinitis. 02/13/20   Aline August, MD  furosemide (LASIX) 20 MG tablet TAKE 1 TABLET (20 MG TOTAL) BY MOUTH DAILY (AM) 11/26/21   Lorretta Harp, MD  isosorbide-hydrALAZINE (BIDIL) 20-37.5 MG tablet TAKE 1 TABLET BY MOUTH 3 (THREE) TIMES DAILY (AM+NOON+BEDTIME) 11/26/21   Lorretta Harp, MD  losartan (COZAAR) 50 MG tablet TAKE 1 TABLET (50 MG TOTAL) BY MOUTH DAILY (AM) 09/11/21   Lorretta Harp, MD  metoCLOPramide (REGLAN) 5 MG tablet Take 1 tablet (5 mg total) by mouth 3 (three) times daily before meals. 02/13/21   Armbruster, Carlota Raspberry, MD  montelukast (SINGULAIR) 10 MG tablet Take 10 mg by mouth at bedtime.    [provider]  omeprazole (PRILOSEC) 40 MG capsule Take 40 mg by mouth in the morning and at bedtime.    [provider]      Allergies    Clarithromycin and Lisinopril    Review of Systems   Review of Systems  Constitutional:  Negative for chills and fever.  HENT:  Negative for sore throat.   Eyes:  Negative for redness.  Respiratory:  Negative for cough and shortness of breath.   Cardiovascular:  Negative for chest pain, palpitations and leg swelling.  Gastrointestinal:  Positive for abdominal pain. Negative for diarrhea and vomiting.  Genitourinary:  Negative for dysuria and  flank pain.  Musculoskeletal:  Negative for back pain and neck pain.  Skin:  Negative for rash.  Neurological:  Negative for headaches.  Hematological:  Does not bruise/bleed easily.  Psychiatric/Behavioral:  Negative for confusion.    Physical Exam Updated Vital Signs BP (!) 184/89 (BP Location: Left Arm)   Pulse 69   Temp 97.8 F (36.6 C) (Oral)   Resp (!) 22   SpO2 100%  Physical Exam Vitals and nursing note reviewed.  Constitutional:      Appearance: Normal appearance. He is well-developed.  HENT:     Head: Atraumatic.     Nose: Nose normal.     Mouth/Throat:     Mouth: Mucous membranes  are moist.  Eyes:     General: No scleral icterus.    Conjunctiva/sclera: Conjunctivae normal.  Neck:     Trachea: No tracheal deviation.  Cardiovascular:     Rate and Rhythm: Normal rate and regular rhythm.     Pulses: Normal pulses.     Heart sounds: Normal heart sounds. No murmur heard.   No friction rub. No gallop.  Pulmonary:     Effort: Pulmonary effort is normal. No accessory muscle usage or respiratory distress.     Breath sounds: Normal breath sounds.  Abdominal:     General: Bowel sounds are normal. There is no distension.     Palpations: Abdomen is soft. There is no mass.     Tenderness: There is no abdominal tenderness. There is no guarding or rebound.     Hernia: No hernia is present.  Genitourinary:    Comments: No cva tenderness. Musculoskeletal:        General: No swelling or tenderness.     Cervical back: Normal range of motion and neck supple. No rigidity.     Right lower leg: No edema.     Left lower leg: No edema.  Skin:    General: Skin is warm and dry.     Findings: No rash.  Neurological:     Mental Status: He is alert.     Comments: Alert, speech clear.   Psychiatric:        Mood and Affect: Mood normal.    ED Results / Procedures / Treatments   Labs (all labs ordered are listed, but only abnormal results are displayed) Results for orders placed or performed during the hospital encounter of 12/23/21  Lipase, blood  Result Value Ref Range   Lipase 27 11 - 51 U/L  CBC with Differential  Result Value Ref Range   WBC 6.6 4.0 - 10.5 K/uL   RBC 5.02 4.22 - 5.81 MIL/uL   Hemoglobin 14.0 13.0 - 17.0 g/dL   HCT 44.2 39.0 - 52.0 %   MCV 88.0 80.0 - 100.0 fL   MCH 27.9 26.0 - 34.0 pg   MCHC 31.7 30.0 - 36.0 g/dL   RDW 15.9 (H) 11.5 - 15.5 %   Platelets 212 150 - 400 K/uL   nRBC 0.0 0.0 - 0.2 %   Neutrophils Relative % 58 %   Neutro Abs 3.8 1.7 - 7.7 K/uL   Lymphocytes Relative 34 %   Lymphs Abs 2.2 0.7 - 4.0 K/uL   Monocytes Relative 6 %    Monocytes Absolute 0.4 0.1 - 1.0 K/uL   Eosinophils Relative 2 %   Eosinophils Absolute 0.1 0.0 - 0.5 K/uL   Basophils Relative 0 %   Basophils Absolute 0.0 0.0 - 0.1 K/uL   Immature Granulocytes 0 %  Abs Immature Granulocytes 0.01 0.00 - 0.07 K/uL  Comprehensive metabolic panel  Result Value Ref Range   Sodium 139 135 - 145 mmol/L   Potassium 3.9 3.5 - 5.1 mmol/L   Chloride 111 98 - 111 mmol/L   CO2 21 (L) 22 - 32 mmol/L   Glucose, Bld 99 70 - 99 mg/dL   BUN 19 8 - 23 mg/dL   Creatinine, Ser 1.54 (H) 0.61 - 1.24 mg/dL   Calcium 9.2 8.9 - 10.3 mg/dL   Total Protein 7.0 6.5 - 8.1 g/dL   Albumin 3.8 3.5 - 5.0 g/dL   AST 20 15 - 41 U/L   ALT 21 0 - 44 U/L   Alkaline Phosphatase 96 38 - 126 U/L   Total Bilirubin 0.1 (L) 0.3 - 1.2 mg/dL   GFR, Estimated 49 (L) >60 mL/min   Anion gap 7 5 - 15  Urinalysis, Routine w reflex microscopic  Result Value Ref Range   Color, Urine YELLOW YELLOW   APPearance CLEAR CLEAR   Specific Gravity, Urine 1.019 1.005 - 1.030   pH 5.0 5.0 - 8.0   Glucose, UA NEGATIVE NEGATIVE mg/dL   Hgb urine dipstick NEGATIVE NEGATIVE   Bilirubin Urine NEGATIVE NEGATIVE   Ketones, ur NEGATIVE NEGATIVE mg/dL   Protein, ur NEGATIVE NEGATIVE mg/dL   Nitrite NEGATIVE NEGATIVE   Leukocytes,Ua NEGATIVE NEGATIVE    EKG None  Radiology No results found.  Procedures Procedures    Medications Ordered in ED Medications  famotidine (PEPCID) tablet 20 mg (has no administration in time range)  alum & mag hydroxide-simeth (MAALOX/MYLANTA) 200-200-20 MG/5ML suspension 30 mL (has no administration in time range)    ED Course/ Medical Decision Making/ A&P                           Medical Decision Making Problems Addressed: Dyspepsia: acute illness or injury with systemic symptoms Essential hypertension: chronic illness or injury with exacerbation, progression, or side effects of treatment that poses a threat to life or bodily functions Stage 3a chronic kidney  disease ([place]): chronic illness or injury Upper abdominal pain: acute illness or injury with systemic symptoms that poses a threat to life or bodily functions  Amount and/or Complexity of Data Reviewed External Data Reviewed: notes. Labs: ordered. Decision-making details documented in ED Course.  Risk OTC drugs. Prescription drug management.  Iv ns. Continuous pulse ox and cardiac monitoring. Labs ordered/sent.  Reviewed nursing notes and prior charts for additional history. External reports reviewed.   Cardiac monitor: sinus rhythm, rate 68.  Labs reviewed/interpreted by me - wbc and hgb normal. Lipase normal.   Pepcid po, maalox po.  Pt has noted resolution of his earlier symptoms. No abd pain. No nausea or vomiting. Abd soft and non tender. Tolerating po.  Pt appears stable for d/c.  Return precautions provided.            Final Clinical Impression(s) / ED Diagnoses Final diagnoses:  None    Rx / DC Orders ED Discharge Orders     None         Lajean Saver, MD 12/23/21 2320

## 2021-12-23 NOTE — ED Notes (Signed)
Fluids given to pt

## 2021-12-23 NOTE — ED Triage Notes (Addendum)
Patient reports mid abdominal pain today , no emesis or diarrhea , denies fever or chills , burping a lot.

## 2021-12-23 NOTE — ED Provider Triage Note (Signed)
Emergency Medicine Provider Triage Evaluation Note  Joe Garcia , a 71 y.o. male  was evaluated in triage.  Pt complains of sharp abdominal pain as well as belching. Pain started 1 hours ago and is mainly periumbilical. He did have one episode of vomiting. He reports that he feels much better after belching. No chest pain or SOB.   Review of Systems  Positive:  Negative:   Physical Exam  BP (!) 184/89 (BP Location: Left Arm)   Pulse 69   Temp 97.8 F (36.6 C) (Oral)   Resp (!) 22   SpO2 100%  Gen:   Awake, no distress   Resp:  Normal effort  MSK:   Moves extremities without difficulty  Other:  Non tender to palpation, soft, NBS.   Medical Decision Making  Medically screening exam initiated at 8:56 PM.  Appropriate orders placed.  Joe Garcia was informed that the remainder of the evaluation will be completed by another provider, this initial triage assessment does not replace that evaluation, and the importance of remaining in the ED until their evaluation is complete.  The patient is repeatedly belching and reports that he is feeling much better. Will order basic lab work and defer imaging until the patient is evaluated in the back.    Sherrell Puller, PA-C 12/23/21 2059

## 2021-12-23 NOTE — Discharge Instructions (Addendum)
It was our pleasure to provide your ER care today - we hope that you feel better.  Continue prilosec. You may also try pepcid, maalox or gas-x as need for symptom relief if GI symptoms.   Follow up closely with primary care doctor this week. Also have your blood pressure rechecked then as it is high today.   Return to ER right away if worse, new symptoms, fevers, new, worsening or severe abdominal pain, persistent vomiting, chest pain, trouble breathing, or other concern.

## 2022-01-30 ENCOUNTER — Ambulatory Visit
Admission: RE | Admit: 2022-01-30 | Discharge: 2022-01-30 | Disposition: A | Discharge status: Home / Self Care | Payer: Medicare Other | Source: Ambulatory Visit | Attending: Student | Admitting: Student

## 2022-01-30 ENCOUNTER — Other Ambulatory Visit: Payer: Self-pay | Admitting: Student

## 2022-01-30 DIAGNOSIS — M25551 Pain in right hip: Secondary | ICD-10-CM

## 2022-02-12 ENCOUNTER — Other Ambulatory Visit: Payer: Self-pay | Admitting: Student

## 2022-02-12 DIAGNOSIS — R079 Chest pain, unspecified: Secondary | ICD-10-CM

## 2022-02-12 DIAGNOSIS — Z9889 Other specified postprocedural states: Secondary | ICD-10-CM

## 2022-02-14 ENCOUNTER — Ambulatory Visit
Admission: RE | Admit: 2022-02-14 | Discharge: 2022-02-14 | Disposition: A | Discharge status: Home / Self Care | Payer: Medicare Other | Source: Ambulatory Visit | Attending: Student | Admitting: Student

## 2022-02-14 DIAGNOSIS — R079 Chest pain, unspecified: Secondary | ICD-10-CM

## 2022-02-14 DIAGNOSIS — Z9889 Other specified postprocedural states: Secondary | ICD-10-CM

## 2022-02-17 ENCOUNTER — Other Ambulatory Visit: Payer: Self-pay

## 2022-05-19 ENCOUNTER — Other Ambulatory Visit: Payer: Self-pay | Admitting: Student

## 2022-05-19 DIAGNOSIS — Z87891 Personal history of nicotine dependence: Secondary | ICD-10-CM

## 2022-05-23 ENCOUNTER — Ambulatory Visit: Payer: Medicare Other | Admitting: General Practice

## 2022-05-30 ENCOUNTER — Ambulatory Visit (HOSPITAL_BASED_OUTPATIENT_CLINIC_OR_DEPARTMENT_OTHER)
Admission: RE | Admit: 2022-05-30 | Discharge: 2022-05-30 | Disposition: A | Discharge status: Home / Self Care | Payer: Medicare Other | Source: Ambulatory Visit | Attending: Nurse Practitioner | Admitting: Nurse Practitioner

## 2022-05-30 ENCOUNTER — Inpatient Hospital Stay: Payer: Medicare Other | Attending: Oncology

## 2022-05-30 DIAGNOSIS — Z79899 Other long term (current) drug therapy: Secondary | ICD-10-CM | POA: Insufficient documentation

## 2022-05-30 DIAGNOSIS — G620 Drug-induced polyneuropathy: Secondary | ICD-10-CM | POA: Insufficient documentation

## 2022-05-30 DIAGNOSIS — R070 Pain in throat: Secondary | ICD-10-CM | POA: Insufficient documentation

## 2022-05-30 DIAGNOSIS — N261 Atrophy of kidney (terminal): Secondary | ICD-10-CM | POA: Insufficient documentation

## 2022-05-30 DIAGNOSIS — D509 Iron deficiency anemia, unspecified: Secondary | ICD-10-CM | POA: Insufficient documentation

## 2022-05-30 DIAGNOSIS — C184 Malignant neoplasm of transverse colon: Secondary | ICD-10-CM | POA: Diagnosis present

## 2022-05-30 DIAGNOSIS — T451X5A Adverse effect of antineoplastic and immunosuppressive drugs, initial encounter: Secondary | ICD-10-CM | POA: Insufficient documentation

## 2022-05-30 DIAGNOSIS — Z8679 Personal history of other diseases of the circulatory system: Secondary | ICD-10-CM | POA: Insufficient documentation

## 2022-05-30 DIAGNOSIS — J45909 Unspecified asthma, uncomplicated: Secondary | ICD-10-CM | POA: Insufficient documentation

## 2022-05-30 DIAGNOSIS — I5022 Chronic systolic (congestive) heart failure: Secondary | ICD-10-CM | POA: Insufficient documentation

## 2022-05-30 MED ORDER — IOHEXOL 300 MG/ML  SOLN
100.0000 mL | Freq: Once | INTRAMUSCULAR | Status: AC | PRN
  Administered 2022-05-30: 100 mL via INTRAVENOUS

## 2022-06-04 ENCOUNTER — Inpatient Hospital Stay (HOSPITAL_BASED_OUTPATIENT_CLINIC_OR_DEPARTMENT_OTHER): Payer: Medicare Other | Admitting: Oncology

## 2022-06-04 VITALS — BP 150/72 | HR 63 | Temp 98.2°F | Resp 18 | Ht 71.0 in | Wt 160.2 lb

## 2022-06-04 DIAGNOSIS — C184 Malignant neoplasm of transverse colon: Secondary | ICD-10-CM

## 2022-06-04 DIAGNOSIS — R070 Pain in throat: Secondary | ICD-10-CM | POA: Diagnosis not present

## 2022-06-04 DIAGNOSIS — I5022 Chronic systolic (congestive) heart failure: Secondary | ICD-10-CM | POA: Diagnosis not present

## 2022-06-04 DIAGNOSIS — T451X5A Adverse effect of antineoplastic and immunosuppressive drugs, initial encounter: Secondary | ICD-10-CM | POA: Diagnosis not present

## 2022-06-04 DIAGNOSIS — J45909 Unspecified asthma, uncomplicated: Secondary | ICD-10-CM | POA: Diagnosis not present

## 2022-06-04 DIAGNOSIS — Z8679 Personal history of other diseases of the circulatory system: Secondary | ICD-10-CM | POA: Diagnosis not present

## 2022-06-04 DIAGNOSIS — D509 Iron deficiency anemia, unspecified: Secondary | ICD-10-CM | POA: Diagnosis not present

## 2022-06-04 DIAGNOSIS — N261 Atrophy of kidney (terminal): Secondary | ICD-10-CM | POA: Diagnosis not present

## 2022-06-04 DIAGNOSIS — Z79899 Other long term (current) drug therapy: Secondary | ICD-10-CM | POA: Diagnosis not present

## 2022-06-04 DIAGNOSIS — G620 Drug-induced polyneuropathy: Secondary | ICD-10-CM | POA: Diagnosis not present

## 2022-06-04 NOTE — Progress Notes (Signed)
Joe Garcia OFFICE PROGRESS NOTE   Diagnosis: Colon cancer  INTERVAL HISTORY:   Mr Joe Garcia returns as scheduled.  He generally feels well.  He reports "throat "pain for the past month.  No dysphagia or odynophagia.  Antacids have not helped.  He feels pain posterior to the cricoid.  Objective:  Vital signs in last 24 hours:  Blood pressure (!) 150/72, pulse 63, temperature 98.2 F (36.8 C), temperature source Oral, resp. rate 18, height _0  (1.803 m), weight 160 lb 3.2 oz (72.7 kg), SpO2 99 %.    HEENT: Oropharynx without visible mass, neck without mass Lymphatics: No cervical, supraclavicular, axillary, or inguinal nodes Resp: Lungs clear bilaterally Cardio: Regular rate and rhythm GI: Hepatosplenomegaly, no mass, nontender Vascular: Leg edema   Lab Results:  Lab Results  Component Value Date   WBC 6.6 12/23/2021   HGB 14.0 12/23/2021   HCT 44.2 12/23/2021   MCV 88.0 12/23/2021   PLT 212 12/23/2021   NEUTROABS 3.8 12/23/2021    CMP  Lab Results  Component Value Date   NA 139 12/23/2021   K 3.9 12/23/2021   CL 111 12/23/2021   CO2 21 (L) 12/23/2021   GLUCOSE 99 12/23/2021   BUN 19 12/23/2021   CREATININE 1.54 (H) 12/23/2021   CALCIUM 9.2 12/23/2021   PROT 7.0 12/23/2021   ALBUMIN 3.8 12/23/2021   AST 20 12/23/2021   ALT 21 12/23/2021   ALKPHOS 96 12/23/2021   BILITOT 0.1 (L) 12/23/2021   GFRNONAA 49 (L) 12/23/2021   GFRAA >60 02/13/2020    Lab Results  Component Value Date   CEA1 1.30 01/22/2021   CEA 1.98 11/27/2021    Medications: I have reviewed the patient's current medications.   Assessment/Plan: Colon cancer, transverse colon, stage IIIc, T4bN2, status post a partial transverse colectomy 06/30/2019 Grade 3, lymphovascular and perineural invasion present, 4/18 lymph nodes positive, positive lymph nodes mesenteric margin, tumor invades into adherent omentum, MSI high, loss of MLH1 and PMS2 expression; MLH1 methylation not  detected; BRAF mutation analysis-negative CT abdomen/pelvis 06/26/2019-thickening within the distal transverse colon, severely atrophic and calcified right kidney CT chest 06/28/2019-no evidence of metastatic disease, stable 3 mm right lower lobe nodule Cycle 1 FOLFOX 08/04/2019 (oxaliplatin 65 mg/m due to renal function) Plan for repeat CTs after he has completed 5 cycles of chemotherapy Cycle 2 FOLFOX 08/18/2019 Cycle 3 FOLFOX 08/31/2019, Udenyca added Cycle 4 FOLFOX 10/12/2019 Cycle 5 FOLFOX 10/25/2019 Stable right lower lobe nodule on chest CT 09/21/2019 CT abdomen/pelvis 11/07/2019-postoperative changes related to colectomy and anastomosis.  No current evidence of disease.  No adenopathy. Cycle 6 FOLFOX 11/08/2019 Cycle 7 FOLFOX 11/23/2019 Cycle 8 FOLFOX 12/06/2019 Cycle 9 FOLFOX 12/20/2019 Case presented GI tumor conference 12/28/2018-tumor margin negative, positive lymph node at margin; no recommendation for further surgery or radiation; peripancreatic node on baseline CT not seen on April 2021 CT Cycle 10 FOLFOX 01/03/2020  Cycle 11 FOLFOX 01/17/2020-oxaliplatin and Udenyca held Cycle 12 FOLFOX 01/31/2020-oxaliplatin and Udenyca held, 5-FU dose reduced secondary to mucositis 05/25/2020 colonoscopy-internal hemorrhoids, repeat colonoscopy in 3 years for surveillance CTs 06/11/2020-new trace right pelvic fluid, nonspecific; new mild left upper lobe interstitial/groundglass opacity suspicious for minimal infection or inflammation; similar 2 mm right lower lobe pulmonary nodule CTs 05/23/2021-no evidence of recurrent colon cancer CTs 05/30/2022-nonspecific groundglass opacities in the bilateral upper lobes, new 4 mm groundglass right upper lobe nodule, no evidence of metastatic disease Asthma Chronic systolic heart failure History of PVCs Atrophic right kidney Microcytic anemia,  likely iron deficiency anemia secondary to #1 Port-A-Cath placement on 12/15/2019, Dr. 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 is in clinical remission from colon cancer.  We reviewed the chest CT images.  The right upper lobe nodule is likely inflammatory.  He will be scheduled for a follow-up chest CT in 3 months. He complains of throat pain that has not improved with omeprazole or Carafate.  I will refer him to ENT.  [name] will return for an office visit, CEA, and chest CT in 3 months. Betsy Coder, MD  06/04/2022  6:05 PM

## 2022-06-11 ENCOUNTER — Encounter: Payer: Self-pay | Admitting: *Deleted

## 2022-06-11 NOTE — Progress Notes (Signed)
Multiple unsuccessful attempts to fax referral to [facility] ENT at [phone] and [phone]. Called and was provided email: [email or web address] and referral sent.

## 2022-08-01 ENCOUNTER — Encounter (HOSPITAL_COMMUNITY): Payer: Self-pay | Admitting: *Deleted

## 2022-08-01 ENCOUNTER — Ambulatory Visit (HOSPITAL_COMMUNITY)
Admission: EM | Admit: 2022-08-01 | Discharge: 2022-08-01 | Disposition: A | Discharge status: Home / Self Care | Payer: Medicare Other

## 2022-08-01 ENCOUNTER — Other Ambulatory Visit (HOSPITAL_COMMUNITY): Payer: Self-pay | Admitting: Cardiovascular Disease

## 2022-08-01 DIAGNOSIS — J439 Emphysema, unspecified: Secondary | ICD-10-CM | POA: Diagnosis not present

## 2022-08-01 MED ORDER — BENZONATATE 100 MG PO CAPS: 100.0000 mg | ORAL_CAPSULE | Freq: Two times a day (BID) | ORAL | 0 refills | Status: DC | PRN

## 2022-08-01 MED ORDER — AMOXICILLIN-POT CLAVULANATE 875-125 MG PO TABS: 1.0000 | ORAL_TABLET | Freq: Two times a day (BID) | ORAL | 0 refills | Status: AC

## 2022-08-01 MED ORDER — IPRATROPIUM-ALBUTEROL 0.5-2.5 (3) MG/3ML IN SOLN
3.0000 mL | Freq: Once | RESPIRATORY_TRACT | Status: AC
  Administered 2022-08-01: 3 mL via RESPIRATORY_TRACT

## 2022-08-01 MED ORDER — IPRATROPIUM-ALBUTEROL 0.5-2.5 (3) MG/3ML IN SOLN
RESPIRATORY_TRACT | Status: AC
  Filled 2022-08-01: qty 3

## 2022-08-01 MED ORDER — IPRATROPIUM-ALBUTEROL 0.5-2.5 (3) MG/3ML IN SOLN: 3.0000 mL | RESPIRATORY_TRACT | 0 refills | Status: DC | PRN

## 2022-08-01 NOTE — Discharge Instructions (Addendum)
You are experiencing an acute exacerbation of your emphysema. Please use the DuoNeb solution in your nebulizer machine called in today in place of your albuterol. Do a treatment every 4-6 hours to help with your cough and shortness of breath. I have also called in Augmentin, an antibiotic to help prevent respiratory infection. Take this twice daily with food until gone. Use the cough pills called in today only at nighttime to help you sleep if needed.  Please schedule follow-up with your PCP next week for a recheck. Consider getting a script from PCP for Spiriva for long term use

## 2022-08-01 NOTE — ED Provider Notes (Signed)
[place]    CSN: [id number] Arrival date & time: 08/01/22  1044      History   Chief Complaint Chief Complaint  Patient presents with   Nasal Congestion   Cough   Shortness of Breath    HPI Joe Garcia is a 72 y.o. male.   72 year old male with a known history of emphysema and asthma presents today due to concerns of nasal congestion, postnasal drip, and mucopurulent cough for the past 4 days.  States he has been using his albuterol inhaler/nebulizer without improvement.  Last dose of nebulizer was at 6 AM this morning.  States he feels tight in the chest and wheezy.  Denies any ear pain, sinus pressure.  Endorses postnasal drip.  States when he gets into a coughing spell he will cough up greenish-yellow mucopurulent drainage, but no hemoptysis.  He denies chest pain or palpitations.  No fever.  Patient does also use Symbicort daily, states he has continued this.   Cough Associated symptoms: shortness of breath   Shortness of Breath Associated symptoms: cough     Past Medical History:  Diagnosis Date   Alcoholism ([place])    Allergy    Arthritis    hips, L shoulder   Asthma    Cancer ([place]) 05/2019   colon cancer   CHF (congestive heart failure) (HCC)    Chronic kidney disease    only has one kidney   COPD (chronic obstructive pulmonary disease) ([place])    COVID-19    Depression    pt denies   Dyspnea    Dysrhythmia    PVC's   Family history of anesthesia complication    GERD (gastroesophageal reflux disease)    Heart murmur    Hyperlipidemia    Hypertension    Myocardial infarction [facility])    pt is unsure of date- believes early 2020   Osteoporosis    Pneumonia    Substance abuse ([place])    alcoholism over 10 yrs ago    Patient Active Problem List   Diagnosis Date Noted   Genetic testing 09/07/2020   Acute-on-chronic kidney injury ([place]) 02/12/2020   Sepsis ([place]) 02/12/2020   Pneumonia 02/12/2020   Multifocal pneumonia 02/11/2020    Port-A-Cath in place 40/98/1191   Chronic systolic heart failure ([place])    Colonic obstruction ([place])    Cancer of transverse colon s/p partial colectomy 06/30/2019    Nausea & vomiting 06/26/2019   Colonic mass 06/26/2019   COPD GOLD ?  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   Solitary kidney, congenital 06/23/2017   Nonischemic cardiomyopathy ([place])    Multifocal PVCs    Chronic combined systolic (congestive) and diastolic (congestive) heart failure ([place]) 06/11/2017   Hypokalemia 05/05/2017   DOE (dyspnea on exertion) 07/16/2013   Alcohol abuse 09/25/2012   Alcohol dependence ([place]) 09/22/2012   Essential hypertension 03/22/2007   DEGENERATIVE [place] DISEASE 03/22/2007   AVASCULAR NECROSIS 03/22/2007    Past Surgical History:  Procedure Laterality Date   BIOPSY  06/27/2019   Procedure: BIOPSY;  Surgeon: Yetta Flock, MD;  Location: [place];  Service: Gastroenterology;;   CATARACT EXTRACTION     COLONOSCOPY Left 06/27/2019  Procedure: COLONOSCOPY;  Surgeon: Yetta Flock, MD;  Location: [facility] ENDOSCOPY;  Service: Gastroenterology;  Laterality: Left;   COLONOSCOPY     LAPAROTOMY N/A 06/30/2019   Procedure: EXPLORATORY LAPAROTOMY;  Surgeon: Ralene Ok, MD;  Location: [place];  Service: General;  Laterality: N/A;   LEFT HEART CATH AND CORONARY ANGIOGRAPHY N/A 10/04/2018   Procedure: LEFT HEART CATH AND CORONARY ANGIOGRAPHY;  Surgeon: Jettie Booze, MD;  Location: [place] CV LAB;  Service: Cardiovascular;  Laterality: N/A;   PARTIAL COLECTOMY N/A 06/30/2019   Procedure: PARTIAL COLECTOMY;  Surgeon: Ralene Ok, MD;  Location: [place];  Service:  General;  Laterality: N/A;   PORTACATH PLACEMENT Left 08/02/2019   Procedure: INSERTION PORT-A-CATH LEFT CHEST;  Surgeon: Ralene Ok, MD;  Location: [place];  Service: General;  Laterality: Left;   RIGHT/LEFT HEART CATH AND CORONARY ANGIOGRAPHY N/A 06/12/2017   Procedure: RIGHT/LEFT HEART CATH AND CORONARY ANGIOGRAPHY;  Surgeon: Jolaine Artist, MD;  Location: [place] CV LAB;  Service: Cardiovascular;  Laterality: N/A;       Home Medications    Prior to Admission medications   Medication Sig Start Date End Date Taking? Authorizing Provider  albuterol (VENTOLIN HFA) 108 (90 Base) MCG/ACT inhaler Inhale 2 puffs into the lungs every 4 (four) hours as needed for wheezing or shortness of breath. 07/09/20  Yes Rodriguez-Southworth, Sunday Spillers, PA-C  amiodarone (PACERONE) 200 MG tablet TAKE 1/2 TABLET (100 MG TOTAL) BY MOUTH DAILY(AM) 08/01/22  Yes Lorretta Harp, MD  amLODipine (NORVASC) 5 MG tablet TAKE 1 TABLET (5 MG TOTAL) BY MOUTH DAILY (AM) 11/26/21  Yes Lorretta Harp, MD  amoxicillin-clavulanate (AUGMENTIN) 875-125 MG tablet Take 1 tablet by mouth 2 (two) times daily with a meal for 5 days. 08/01/22 08/06/22 Yes Carmine Carrozza L, PA  atorvastatin (LIPITOR) 10 MG tablet Take 10 mg by mouth at bedtime.   Yes [provider]  benzonatate (TESSALON) 100 MG capsule Take 1 capsule (100 mg total) by mouth 2 (two) times daily as needed for cough. 08/01/22  Yes Auden Wettstein L, PA  budesonide-formoterol (SYMBICORT) 160-4.5 MCG/ACT inhaler Inhale 2 puffs into the lungs 2 (two) times daily. 11/23/18  Yes Tanda Rockers, MD  carvedilol (COREG) 3.125 MG tablet TAKE 1 TABLET (3.125 MG TOTAL) BY MOUTH 2 (TWO) TIMES DAILY WITH A MEAL. 11/26/21  Yes Lorretta Harp, MD  fluticasone [facility]) 50 MCG/ACT nasal spray Place 2 sprays into both nostrils daily as needed for allergies or rhinitis. 02/13/20  Yes Aline August, MD  furosemide (LASIX) 20 MG tablet TAKE 1 TABLET (20 MG TOTAL) BY MOUTH DAILY  (AM) 11/26/21  Yes Lorretta Harp, MD  ipratropium-albuterol (DUONEB) 0.5-2.5 (3) MG/3ML SOLN Take 3 mLs by nebulization every 4 (four) hours as needed. 08/01/22  Yes Leshia Kope L, PA  isosorbide-hydrALAZINE (BIDIL) 20-37.5 MG tablet TAKE 1 TABLET BY MOUTH 3 (THREE) TIMES DAILY (AM+NOON+BEDTIME) 11/26/21  Yes Lorretta Harp, MD  losartan (COZAAR) 50 MG tablet TAKE 1 TABLET (50 MG TOTAL) BY MOUTH DAILY (AM) 09/11/21  Yes Lorretta Harp, MD  metoCLOPramide (REGLAN) 5 MG tablet Take 1 tablet (5 mg total) by mouth 3 (three) times daily before meals. 02/13/21  Yes Armbruster, Carlota Raspberry, MD  montelukast (SINGULAIR) 10 MG tablet Take 10 mg by mouth at bedtime.   Yes [provider]  omeprazole (PRILOSEC) 40 MG capsule Take 1 capsule (40 mg total) by mouth daily. 12/23/21  Yes Lajean Saver, MD  sucralfate ([place]) 1  g tablet Take 1 g by mouth every 8 (eight) hours. 05/22/22  Yes [provider]    Family History Family History  Problem Relation Age of Onset   Heart disease Father    Heart disease Daughter        "fluid around heart"    Canavan disease Daughter    Cancer Daughter        unsure of type   Asthma Son    Heart failure Mother    Kidney failure Mother    Colon cancer Neg Hx    Esophageal cancer Neg Hx    Rectal cancer Neg Hx    Stomach cancer Neg Hx     Social History Social History   Tobacco Use   Smoking status: Former    Packs/day: 0.10    Years: 45.00    Total pack years: 4.50    Types: Cigarettes    Quit date: 2012    Years since quitting: 12.0   Smokeless tobacco: Never  Vaping Use   Vaping Use: Never used  Substance Use Topics   Alcohol use: No    Comment: Pt states no etoh since April 2014   Drug use: Not Currently    Comment: Prior use of crack cocaine, quit 2012     Allergies   Clarithromycin and Lisinopril   Review of Systems Review of Systems  Respiratory:  Positive for cough and shortness of breath.   As per  HPI   Physical Exam Triage Vital Signs ED Triage Vitals  Enc Vitals Group     BP 08/01/22 1250 131/73     Pulse Rate 08/01/22 1250 67     Resp 08/01/22 1250 20     Temp 08/01/22 1250 97.9 F (36.6 C)     Temp Source 08/01/22 1250 Oral     SpO2 08/01/22 1250 97 %     Weight --      Height --      Head Circumference --      Peak Flow --      Pain Score 08/01/22 1248 0     Pain Loc --      Pain Edu? --      Excl. in [place]? --    No data found.  Updated Vital Signs BP 131/73 (BP Location: Right Arm)   Pulse 67   Temp 97.9 F (36.6 C) (Oral)   Resp 20   SpO2 97%   Visual Acuity Right Eye Distance:   Left Eye Distance:   Bilateral Distance:    Right Eye Near:   Left Eye Near:    Bilateral Near:     Physical Exam Vitals and nursing note reviewed.  Constitutional:      General: He is not in acute distress.    Appearance: He is well-developed. He is not ill-appearing, toxic-appearing or diaphoretic.  HENT:     Head: Normocephalic and atraumatic.     Mouth/Throat:     Mouth: Mucous membranes are moist.     Pharynx: Oropharynx is clear. No pharyngeal swelling or oropharyngeal exudate.  Eyes:     Extraocular Movements: Extraocular movements intact.     Conjunctiva/sclera: Conjunctivae normal.     Pupils: Pupils are equal, round, and reactive to light.  Neck:     Thyroid: No thyromegaly.  Cardiovascular:     Rate and Rhythm: Normal rate and regular rhythm.     Heart sounds: No murmur heard. Pulmonary:     Effort: Pulmonary effort  is normal. No respiratory distress.     Breath sounds: Examination of the right-upper field reveals wheezing. Examination of the left-upper field reveals wheezing. Examination of the right-middle field reveals wheezing. Examination of the left-middle field reveals wheezing. Examination of the right-lower field reveals wheezing. Examination of the left-lower field reveals wheezing. Wheezing present. No decreased breath sounds, rhonchi or rales.   Chest:     Chest wall: No mass, deformity, tenderness or crepitus.  Abdominal:     Palpations: Abdomen is soft.     Tenderness: There is no abdominal tenderness.  Musculoskeletal:        General: No swelling.     Cervical back: Normal range of motion and neck supple.  Lymphadenopathy:     Cervical: No cervical adenopathy.  Skin:    General: Skin is warm and dry.     Capillary Refill: Capillary refill takes less than 2 seconds.     Findings: No erythema.  Neurological:     General: No focal deficit present.     Mental Status: He is alert and oriented to person, place, and time.  Psychiatric:        Mood and Affect: Mood normal.      UC Treatments / Results  Labs (all labs ordered are listed, but only abnormal results are displayed) Labs Reviewed - No data to display  EKG   Radiology No results found.  Procedures Procedures (including critical care time)  Medications Ordered in UC Medications  ipratropium-albuterol (DUONEB) 0.5-2.5 (3) MG/3ML nebulizer solution 3 mL (3 mLs Nebulization Given 08/01/22 1339)    Initial Impression / Assessment and Plan / UC Course  I have reviewed the triage vital signs and the nursing notes.  Pertinent labs & imaging results that were available during my care of the patient were reviewed by me and considered in my medical decision making (see chart for details).     Acute exacerbation of emphysema -patient with remarkable improvement in his symptoms status post 1 single dose of DuoNeb in clinic.  Will therefore send patient home with prescription for DuoNeb every 4-6 hours and his home nebulizer machine to take place of his current albuterol.  Will also add Augmentin for 5 days twice daily.  Given significant improvement with nebulizer alone, p.o. steroids not indicated at present time.  Patient's vital signs are stable.  I did recommend that he follow-up with his PCP to possibly discussed Spiriva for long-term, daily use.  Will also give  patient Tessalon for as needed use.  Return to clinic precautions reviewed. Amiodarone appears to have been called in by pt's cardiologist at time of his encounter today. This script will also be ready at pharmacy today, but was called in by Dr. Gwenlyn Found rather than UC.   Final Clinical Impressions(s) / UC Diagnoses   Final diagnoses:  Acute exacerbation of emphysema ([place])     Discharge Instructions      You are experiencing an acute exacerbation of your emphysema. Please use the DuoNeb solution in your nebulizer machine called in today in place of your albuterol. Do a treatment every 4-6 hours to help with your cough and shortness of breath. I have also called in Augmentin, an antibiotic to help prevent respiratory infection. Take this twice daily with food until gone. Use the cough pills called in today only at nighttime to help you sleep if needed.  Please schedule follow-up with your PCP next week for a recheck. Consider getting a script from PCP for Spiriva  for long term use     ED Prescriptions     Medication Sig Dispense Auth. Provider   amoxicillin-clavulanate (AUGMENTIN) 875-125 MG tablet Take 1 tablet by mouth 2 (two) times daily with a meal for 5 days. 10 tablet Suellyn Meenan L, PA   ipratropium-albuterol (DUONEB) 0.5-2.5 (3) MG/3ML SOLN Take 3 mLs by nebulization every 4 (four) hours as needed. 120 mL Aniesha Haughn L, PA   benzonatate (TESSALON) 100 MG capsule Take 1 capsule (100 mg total) by mouth 2 (two) times daily as needed for cough. 20 capsule Prather Failla L, PA      PDMP not reviewed this encounter.   Chaney Malling, [place] 08/01/22 1400

## 2022-08-01 NOTE — ED Triage Notes (Signed)
Pt states he has cough and congestion x 3-4 days, he has asthma he said his asthma meds don't work anymore and he is currently out of the inhalers. He has been doing his neb treatments his last one was at [facility]. He has been doing back to back to try to help but states that they aren't helping.

## 2022-08-05 ENCOUNTER — Ambulatory Visit: Payer: Medicare Other | Attending: Cardiovascular Disease | Admitting: Cardiovascular Disease

## 2022-08-05 ENCOUNTER — Encounter: Payer: Self-pay | Admitting: Cardiovascular Disease

## 2022-08-05 VITALS — BP 138/72 | HR 56 | Ht 71.0 in | Wt 161.0 lb

## 2022-08-05 DIAGNOSIS — I10 Essential (primary) hypertension: Secondary | ICD-10-CM | POA: Diagnosis not present

## 2022-08-05 DIAGNOSIS — I5042 Chronic combined systolic (congestive) and diastolic (congestive) heart failure: Secondary | ICD-10-CM | POA: Diagnosis not present

## 2022-08-05 DIAGNOSIS — E782 Mixed hyperlipidemia: Secondary | ICD-10-CM

## 2022-08-05 DIAGNOSIS — E785 Hyperlipidemia, unspecified: Secondary | ICD-10-CM | POA: Insufficient documentation

## 2022-08-05 NOTE — Assessment & Plan Note (Signed)
History of essential hypertension blood pressure measured today at 138/72.  He is on amlodipine, carvedilol, BiDil and losartan.

## 2022-08-05 NOTE — Assessment & Plan Note (Signed)
History of hyperlipidemia on statin therapy with lipid profile performed 3 months ago revealing total cholesterol 145, LDL 66 and HDL of 59.

## 2022-08-05 NOTE — Patient Instructions (Signed)
Medication Instructions:  Your physician recommends that you continue on your current medications as directed. Please refer to the Current Medication list given to you today.  *If you need a refill on your cardiac medications before your next appointment, please call your pharmacy*   Follow-Up: At [facility], you and your health needs are our priority.  As part of our continuing mission to provide you with exceptional heart care, we have created designated Provider Care Teams.  These Care Teams include your primary Cardiologist (physician) and Advanced Practice Providers (APPs -  Physician Assistants and Nurse Practitioners) who all work together to provide you with the care you need, when you need it.  We recommend signing up for the patient portal called "MyChart".  Sign up information is provided on this After Visit Summary.  MyChart is used to connect with patients for Virtual Visits (Telemedicine).  Patients are able to view lab/test results, encounter notes, upcoming appointments, etc.  Non-urgent messages can be sent to your provider as well.   To learn more about what you can do with MyChart, go to [email or web address].    Your next appointment:   12 month(s)  The format for your next appointment:   In Person  Provider:   Quay Burow, MD     Other Instructions Phone: [phone] Fax: [phone]

## 2022-08-05 NOTE — Progress Notes (Signed)
08/05/2022 ERICO STAN   1950-10-07  [id number]  Primary Physician Medicine, [facility] Primary Cardiologist: Lorretta Harp MD [name], [place]  HPI:  Joe Garcia is a 72 y.o.  thin appearing single African-American male father of a children, grandfather to 7 grandchildren who is retired from Paediatric nurse work. I last saw him in the hospital 1//23 when he was admitted with a non-STEMI. He underwent cardiac catheterization by Dr. Irish Lack revealing clean coronary arteries and LV dysfunction consistent with Takotsubo syndrome. He had had nonischemic cardiomyopathy in the past and was followed by Dr. [name]. His most recent 2D echo performed 09/20/2020 revealed normalization of his LV function. Other problems include treated hypertension. He smoked remotely as well as drank alcohol remotely all of which she stopped.   Since I saw him a year ago he is continue to improve.  He denies chest pain but does get short of breath because of reactive airways disease.  He did have a 2D echocardiogram performed 09/16/2020 that was essentially normal.  He remains on guideline directed optimal medical therapy.  Current Meds  Medication Sig   albuterol (VENTOLIN HFA) 108 (90 Base) MCG/ACT inhaler Inhale 2 puffs into the lungs every 4 (four) hours as needed for wheezing or shortness of breath.   amiodarone (PACERONE) 200 MG tablet TAKE 1/2 TABLET (100 MG TOTAL) BY MOUTH DAILY(AM)   amLODipine (NORVASC) 5 MG tablet TAKE 1 TABLET (5 MG TOTAL) BY MOUTH DAILY (AM)   amoxicillin-clavulanate (AUGMENTIN) 875-125 MG tablet Take 1 tablet by mouth 2 (two) times daily with a meal for 5 days.   atorvastatin (LIPITOR) 10 MG tablet Take 10 mg by mouth at bedtime.   benzonatate (TESSALON) 100 MG capsule Take 1 capsule (100 mg total) by mouth 2 (two) times daily as needed for cough.   budesonide-formoterol (SYMBICORT) 160-4.5 MCG/ACT inhaler Inhale 2 puffs into the lungs 2 (two)  times daily.   carvedilol (COREG) 3.125 MG tablet TAKE 1 TABLET (3.125 MG TOTAL) BY MOUTH 2 (TWO) TIMES DAILY WITH A MEAL.   fluticasone (FLONASE) 50 MCG/ACT nasal spray Place 2 sprays into both nostrils daily as needed for allergies or rhinitis.   furosemide (LASIX) 20 MG tablet TAKE 1 TABLET (20 MG TOTAL) BY MOUTH DAILY (AM)   ipratropium-albuterol (DUONEB) 0.5-2.5 (3) MG/3ML SOLN Take 3 mLs by nebulization every 4 (four) hours as needed.   isosorbide-hydrALAZINE (BIDIL) 20-37.5 MG tablet TAKE 1 TABLET BY MOUTH 3 (THREE) TIMES DAILY (AM+NOON+BEDTIME)   losartan (COZAAR) 50 MG tablet TAKE 1 TABLET (50 MG TOTAL) BY MOUTH DAILY (AM)   metoCLOPramide (REGLAN) 5 MG tablet Take 1 tablet (5 mg total) by mouth 3 (three) times daily before meals.   montelukast (SINGULAIR) 10 MG tablet Take 10 mg by mouth at bedtime.   omeprazole (PRILOSEC) 40 MG capsule Take 1 capsule (40 mg total) by mouth daily.   sucralfate (CARAFATE) 1 g tablet Take 1 g by mouth every 8 (eight) hours.     Allergies  Allergen Reactions   Clarithromycin Shortness Of Breath   Lisinopril Swelling    Facial and tongue swelling 10/2012    Social History   Socioeconomic History   Marital status: Single    Spouse name: Not on file   Number of children: Not on file   Years of education: Not on file   Highest education level: Not on file  Occupational History   Occupation: "it don't work for me", on  disability  Tobacco Use   Smoking status: Former    Packs/day: 0.10    Years: 45.00    Total pack years: 4.50    Types: Cigarettes    Quit date: 2012    Years since quitting: 12.0   Smokeless tobacco: Never  Vaping Use   Vaping Use: Never used  Substance and Sexual Activity   Alcohol use: No    Comment: Pt states no etoh since April 2014   Drug use: Not Currently    Comment: Prior use of crack cocaine, quit 2012   Sexual activity: Never  Other Topics Concern   Not on file  Social History Narrative   Not on file    Social Determinants of Health   Financial Resource Strain: Not on file  Food Insecurity: Not on file  Transportation Needs: Not on file  Physical Activity: Not on file  Stress: Not on file  Social Connections: Not on file  Intimate Partner Violence: Not on file     Review of Systems: General: negative for chills, fever, night sweats or weight changes.  Cardiovascular: negative for chest pain, dyspnea on exertion, edema, orthopnea, palpitations, paroxysmal nocturnal dyspnea or shortness of breath Dermatological: negative for rash Respiratory: negative for cough or wheezing Urologic: negative for hematuria Abdominal: negative for nausea, vomiting, diarrhea, bright red blood per rectum, melena, or hematemesis Neurologic: negative for visual changes, syncope, or dizziness All other systems reviewed and are otherwise negative except as noted above.    Blood pressure 138/72, pulse (!) 56, height '5\' 11"'$  (1.803 m), weight 161 lb (73 kg), SpO2 96 %.  General appearance: alert and no distress Neck: no adenopathy, no carotid bruit, no JVD, supple, symmetrical, trachea midline, and thyroid not enlarged, symmetric, no tenderness/mass/nodules Lungs: clear to auscultation bilaterally Heart: regular rate and rhythm, S1, S2 normal, no murmur, click, rub or gallop Extremities: extremities normal, atraumatic, no cyanosis or edema Pulses: 2+ and symmetric Skin: Skin color, texture, turgor normal. No rashes or lesions Neurologic: Grossly normal  EKG sinus bradycardia 56 without ST or T wave changes.  Personally reviewed this EKG.  ASSESSMENT AND PLAN:   Essential hypertension History of essential hypertension blood pressure measured today at 138/72.  He is on amlodipine, carvedilol, BiDil and losartan.  Chronic combined systolic (congestive) and diastolic (congestive) heart failure (HCC) History of combined chronic systolic and diastolic heart failure with Takotsubo syndrome in the past.  He  underwent cardiac catheterization by Dr. Irish Lack revealing normal coronary arteries.  Ultimately, with guideline directed optimal medical therapy his LV function improved to normal by 2D echo performed 09/16/2020.  He does have grade 1 diastolic dysfunction.  He does complain shortness of breath but which he attributes to his "asthma".  Hyperlipidemia History of hyperlipidemia on statin therapy with lipid profile performed 3 months ago revealing total cholesterol 145, LDL 66 and HDL of 59.     Lorretta Harp MD FACP,FACC,FAHA, [facility] 08/05/2022 11:53 AM

## 2022-08-05 NOTE — Assessment & Plan Note (Signed)
History of combined chronic systolic and diastolic heart failure with Takotsubo syndrome in the past.  He underwent cardiac catheterization by Dr. Irish Lack revealing normal coronary arteries.  Ultimately, with guideline directed optimal medical therapy his LV function improved to normal by 2D echo performed 09/16/2020.  He does have grade 1 diastolic dysfunction.  He does complain shortness of breath but which he attributes to his "asthma".

## 2022-08-18 ENCOUNTER — Ambulatory Visit (INDEPENDENT_AMBULATORY_CARE_PROVIDER_SITE_OTHER): Payer: 59

## 2022-08-18 ENCOUNTER — Ambulatory Visit (HOSPITAL_COMMUNITY)
Admission: EM | Admit: 2022-08-18 | Discharge: 2022-08-18 | Disposition: A | Discharge status: Home / Self Care | Payer: 59 | Attending: Internal Medicine | Admitting: Internal Medicine

## 2022-08-18 ENCOUNTER — Encounter (HOSPITAL_COMMUNITY): Payer: Self-pay | Admitting: Emergency Medicine

## 2022-08-18 ENCOUNTER — Encounter: Payer: Self-pay | Admitting: Oncology

## 2022-08-18 DIAGNOSIS — R110 Nausea: Secondary | ICD-10-CM

## 2022-08-18 DIAGNOSIS — R059 Cough, unspecified: Secondary | ICD-10-CM | POA: Diagnosis not present

## 2022-08-18 DIAGNOSIS — R0602 Shortness of breath: Secondary | ICD-10-CM | POA: Diagnosis not present

## 2022-08-18 DIAGNOSIS — J441 Chronic obstructive pulmonary disease with (acute) exacerbation: Secondary | ICD-10-CM | POA: Insufficient documentation

## 2022-08-18 LAB — CBC WITH DIFFERENTIAL/PLATELET
Abs Immature Granulocytes: 0.03 10*3/uL (ref 0.00–0.07)
Basophils Absolute: 0 10*3/uL (ref 0.0–0.1)
Basophils Relative: 1 %
Eosinophils Absolute: 0.3 10*3/uL (ref 0.0–0.5)
Eosinophils Relative: 6 %
HCT: 43.9 % (ref 39.0–52.0)
Hemoglobin: 14.9 g/dL (ref 13.0–17.0)
Immature Granulocytes: 1 %
Lymphocytes Relative: 31 %
Lymphs Abs: 1.5 10*3/uL (ref 0.7–4.0)
MCH: 29.1 pg (ref 26.0–34.0)
MCHC: 33.9 g/dL (ref 30.0–36.0)
MCV: 85.7 fL (ref 80.0–100.0)
Monocytes Absolute: 0.4 10*3/uL (ref 0.1–1.0)
Monocytes Relative: 8 %
Neutro Abs: 2.6 10*3/uL (ref 1.7–7.7)
Neutrophils Relative %: 53 %
Platelets: 221 10*3/uL (ref 150–400)
RBC: 5.12 MIL/uL (ref 4.22–5.81)
RDW: 15.1 % (ref 11.5–15.5)
WBC: 4.8 10*3/uL (ref 4.0–10.5)
nRBC: 0 % (ref 0.0–0.2)

## 2022-08-18 LAB — COMPREHENSIVE METABOLIC PANEL
ALT: 12 U/L (ref 0–44)
AST: 16 U/L (ref 15–41)
Albumin: 3.8 g/dL (ref 3.5–5.0)
Alkaline Phosphatase: 91 U/L (ref 38–126)
Anion gap: 14 (ref 5–15)
BUN: 18 mg/dL (ref 8–23)
CO2: 20 mmol/L — ABNORMAL LOW (ref 22–32)
Calcium: 9.4 mg/dL (ref 8.9–10.3)
Chloride: 104 mmol/L (ref 98–111)
Creatinine, Ser: 1.54 mg/dL — ABNORMAL HIGH (ref 0.61–1.24)
GFR, Estimated: 49 mL/min — ABNORMAL LOW (ref >60)
Glucose, Bld: 66 mg/dL — ABNORMAL LOW (ref 70–99)
Potassium: 4.2 mmol/L (ref 3.5–5.1)
Sodium: 138 mmol/L (ref 135–145)
Total Bilirubin: 0.5 mg/dL (ref 0.3–1.2)
Total Protein: 7.4 g/dL (ref 6.5–8.1)

## 2022-08-18 MED ORDER — ONDANSETRON HCL 4 MG/2ML IJ SOLN
INTRAMUSCULAR | Status: AC
  Filled 2022-08-18: qty 2

## 2022-08-18 MED ORDER — PREDNISONE 20 MG PO TABS: 40.0000 mg | ORAL_TABLET | Freq: Every day | ORAL | 0 refills | Status: AC

## 2022-08-18 MED ORDER — IPRATROPIUM-ALBUTEROL 0.5-2.5 (3) MG/3ML IN SOLN
3.0000 mL | Freq: Once | RESPIRATORY_TRACT | Status: AC
  Administered 2022-08-18: 3 mL via RESPIRATORY_TRACT

## 2022-08-18 MED ORDER — ONDANSETRON 4 MG PO TBDP: 4.0000 mg | ORAL_TABLET | Freq: Three times a day (TID) | ORAL | 0 refills | Status: DC | PRN

## 2022-08-18 MED ORDER — ONDANSETRON 4 MG PO TBDP: 4.0000 mg | ORAL_TABLET | Freq: Once | ORAL | Status: DC

## 2022-08-18 MED ORDER — METHYLPREDNISOLONE SODIUM SUCC 125 MG IJ SOLR
60.0000 mg | Freq: Once | INTRAMUSCULAR | Status: AC
  Administered 2022-08-18: 60 mg via INTRAMUSCULAR

## 2022-08-18 MED ORDER — METHYLPREDNISOLONE SODIUM SUCC 125 MG IJ SOLR
INTRAMUSCULAR | Status: AC
  Filled 2022-08-18: qty 2

## 2022-08-18 MED ORDER — IPRATROPIUM-ALBUTEROL 0.5-2.5 (3) MG/3ML IN SOLN
RESPIRATORY_TRACT | Status: AC
  Filled 2022-08-18: qty 3

## 2022-08-18 MED ORDER — DOXYCYCLINE HYCLATE 100 MG PO CAPS: 100.0000 mg | ORAL_CAPSULE | Freq: Two times a day (BID) | ORAL | 0 refills | Status: AC

## 2022-08-18 MED ORDER — ONDANSETRON HCL 4 MG/2ML IJ SOLN
4.0000 mg | Freq: Once | INTRAMUSCULAR | Status: AC
  Administered 2022-08-18: 4 mg via INTRAMUSCULAR

## 2022-08-18 NOTE — ED Triage Notes (Signed)
Pt reports was seen here couple weeks ago for his asthma and cough. Reports given medications that helped little but symptoms still persistent. Reports hasn't made PCP aware.

## 2022-08-18 NOTE — Discharge Instructions (Addendum)
Please use your albuterol inhaler and nebulizer machine at home Please take medications as prescribed Chest x-ray is negative for pneumonia Will call you with recommendations if labs are abnormal Return to urgent care if symptoms worsen.

## 2022-08-18 NOTE — ED Provider Notes (Signed)
[facility]    CSN: [id number] Arrival date & time: 08/18/22  1211      History   Chief Complaint Chief Complaint  Patient presents with   Cough   Shortness of Breath    HPI Joe Garcia is a 72 y.o. male with a history of COPD, sweats and CAD with worsening shortness of breath, wheezing and cough productive of clear sputum.  Patient was seen about 3 weeks ago for COPD exacerbation.  Patient was treated with some antibiotics, steroids and bronchodilator use was recommended.  It appears the patient has not been compliant to the bronchodilator use and steroids.  Over the past several days he has had worsening shortness of breath, chest tightness and cough which is productive of clear sputum.  No fever or chills.  He endorses shortness of breath with chest tightness.  No chest pain.  No fever or chills.  Patient denies any dizziness, near syncope or syncopal episodes.   HPI  Past Medical History:  Diagnosis Date   Alcoholism ([place])    Allergy    Arthritis    hips, L shoulder   Asthma    Cancer ([place]) 05/2019   colon cancer   CHF (congestive heart failure) (HCC)    Chronic kidney disease    only has one kidney   COPD (chronic obstructive pulmonary disease) ([place])    COVID-19    Depression    pt denies   Dyspnea    Dysrhythmia    PVC's   Family history of anesthesia complication    GERD (gastroesophageal reflux disease)    Heart murmur    Hyperlipidemia    Hypertension    Myocardial infarction [facility])    pt is unsure of date- believes early 2020   Osteoporosis    Pneumonia    Substance abuse ([place])    alcoholism over 10 yrs ago    Patient Active Problem List   Diagnosis Date Noted   Hyperlipidemia 08/05/2022   Genetic testing 09/07/2020   Acute-on-chronic kidney injury ([place]) 02/12/2020   Sepsis ([place]) 02/12/2020   Pneumonia 02/12/2020   Multifocal pneumonia 02/11/2020   Port-A-Cath in place 92/05/9416   Chronic systolic heart failure ([place])    Colonic  obstruction ([place])    Cancer of transverse colon s/p partial colectomy 06/30/2019    Nausea & vomiting 06/26/2019   Colonic mass 06/26/2019   COPD GOLD ?  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   Solitary kidney, congenital 06/23/2017   Nonischemic cardiomyopathy ([place])    Multifocal PVCs    Chronic combined systolic (congestive) and diastolic (congestive) heart failure ([place]) 06/11/2017   Hypokalemia 05/05/2017   DOE (dyspnea on exertion) 07/16/2013   Alcohol abuse 09/25/2012   Alcohol dependence ([place]) 09/22/2012   Essential hypertension 03/22/2007   DEGENERATIVE [place] DISEASE 03/22/2007   AVASCULAR NECROSIS 03/22/2007    Past Surgical History:  Procedure Laterality Date   BIOPSY  06/27/2019   Procedure: BIOPSY;  Surgeon: Yetta Flock, MD;  Location: [place];  Service: Gastroenterology;;   CATARACT EXTRACTION     COLONOSCOPY Left 06/27/2019   Procedure: COLONOSCOPY;  Surgeon: Yetta Flock, MD;  Location: [facility] [facility];  Service: Gastroenterology;  Laterality: Left;   COLONOSCOPY     LAPAROTOMY N/A 06/30/2019   Procedure: EXPLORATORY LAPAROTOMY;  Surgeon: Ralene Ok, MD;  Location: [place];  Service: General;  Laterality: N/A;   LEFT HEART CATH AND CORONARY ANGIOGRAPHY N/A 10/04/2018   Procedure: LEFT HEART CATH AND CORONARY ANGIOGRAPHY;  Surgeon: Jettie Booze, MD;  Location: [place] [facility];  Service: Cardiovascular;  Laterality: N/A;   PARTIAL COLECTOMY N/A 06/30/2019   Procedure: PARTIAL COLECTOMY;  Surgeon: Ralene Ok, MD;  Location: [place];  Service: General;  Laterality: N/A;   PORTACATH PLACEMENT Left 08/02/2019   Procedure:  INSERTION PORT-A-CATH LEFT CHEST;  Surgeon: Ralene Ok, MD;  Location: [facility];  Service: General;  Laterality: Left;   RIGHT/LEFT HEART CATH AND CORONARY ANGIOGRAPHY N/A 06/12/2017   Procedure: RIGHT/LEFT HEART CATH AND CORONARY ANGIOGRAPHY;  Surgeon: Jolaine Artist, MD;  Location: [place] [facility];  Service: Cardiovascular;  Laterality: N/A;       Home Medications    Prior to Admission medications   Medication Sig Start Date End Date Taking? Authorizing Provider  doxycycline (VIBRAMYCIN) 100 MG capsule Take 1 capsule (100 mg total) by mouth 2 (two) times daily for 7 days. 08/18/22 08/25/22 Yes Auriana Scalia, Myrene Galas, MD  ondansetron (ZOFRAN-ODT) 4 MG disintegrating tablet Take 1 tablet (4 mg total) by mouth every 8 (eight) hours as needed for nausea or vomiting. 08/18/22  Yes Lecia Esperanza, Myrene Galas, MD  predniSONE (DELTASONE) 20 MG tablet Take 2 tablets (40 mg total) by mouth daily for 5 days. 08/18/22 08/23/22 Yes Arian Mcquitty, Myrene Galas, MD  albuterol (VENTOLIN HFA) 108 (90 Base) MCG/ACT inhaler Inhale 2 puffs into the lungs every 4 (four) hours as needed for wheezing or shortness of breath. 07/09/20   Rodriguez-Southworth, Sunday Spillers, PA-C  amiodarone (PACERONE) 200 MG tablet TAKE 1/2 TABLET (100 MG TOTAL) BY MOUTH DAILY(AM) 08/01/22   Lorretta Harp, MD  amLODipine (NORVASC) 5 MG tablet TAKE 1 TABLET (5 MG TOTAL) BY MOUTH DAILY (AM) 11/26/21   Lorretta Harp, MD  atorvastatin (LIPITOR) 10 MG tablet Take 10 mg by mouth at bedtime.    [provider]  benzonatate (TESSALON) 100 MG capsule Take 1 capsule (100 mg total) by mouth 2 (two) times daily as needed for cough. 08/01/22   Crain, Whitney L, PA  budesonide-formoterol (SYMBICORT) 160-4.5 MCG/ACT inhaler Inhale 2 puffs into the lungs 2 (two) times daily. 11/23/18   Tanda Rockers, MD  carvedilol (COREG) 3.125 MG tablet TAKE 1 TABLET (3.125 MG TOTAL) BY MOUTH 2 (TWO) TIMES DAILY WITH A MEAL. 11/26/21   Lorretta Harp, MD  fluticasone (FLONASE)  50 MCG/ACT nasal spray Place 2 sprays into both nostrils daily as needed for allergies or rhinitis. 02/13/20   Aline August, MD  furosemide (LASIX) 20 MG tablet TAKE 1 TABLET (20 MG TOTAL) BY MOUTH DAILY (AM) 11/26/21   Lorretta Harp, MD  ipratropium-albuterol (DUONEB) 0.5-2.5 (3) MG/3ML SOLN Take 3 mLs by nebulization every 4 (four) hours as needed. 08/01/22   Crain, Whitney L, PA  isosorbide-hydrALAZINE (BIDIL) 20-37.5 MG tablet TAKE 1 TABLET BY MOUTH 3 (THREE) TIMES DAILY (AM+NOON+BEDTIME) 11/26/21   Lorretta Harp, MD  losartan (COZAAR) 50 MG tablet TAKE 1 TABLET (50 MG TOTAL) BY MOUTH DAILY (AM) 09/11/21   Lorretta Harp, MD  metoCLOPramide (REGLAN) 5 MG tablet Take 1 tablet (5 mg total) by mouth 3 (three) times daily before meals. 02/13/21   Armbruster, Carlota Raspberry, MD  montelukast (SINGULAIR) 10 MG tablet Take 10 mg by mouth at bedtime.    [provider]  omeprazole (PRILOSEC) 40 MG capsule Take 1 capsule (40 mg total) by mouth daily. 12/23/21   Lajean Saver, MD  sucralfate (CARAFATE) 1 g tablet Take 1 g by mouth every 8 (eight) hours. 05/22/22   [provider]    Family History Family History  Problem Relation Age of Onset   Heart disease Father    Heart disease Daughter        "fluid around heart"    Canavan disease Daughter    Cancer Daughter        unsure of type   Asthma Son    Heart failure Mother    Kidney failure Mother    Colon cancer Neg Hx    Esophageal cancer Neg Hx    Rectal cancer Neg Hx    Stomach cancer Neg Hx     Social History Social History   Tobacco Use   Smoking status: Former    Packs/day: 0.10    Years: 45.00    Total pack years: 4.50    Types: Cigarettes    Quit date: 2012    Years since quitting: 12.0   Smokeless tobacco: Never  Vaping Use   Vaping Use: Never used  Substance Use Topics   Alcohol use: No    Comment: Pt states no etoh since April 2014   Drug use: Not Currently    Comment: Prior use of crack cocaine, quit  2012     Allergies   Clarithromycin and Lisinopril   Review of Systems Review of Systems  Constitutional:  Negative for chills and fever.  HENT:  Negative for congestion and sore throat.   Eyes: Negative.   Respiratory:  Positive for cough, chest tightness, shortness of breath and wheezing.   Cardiovascular:  Negative for chest pain and palpitations.  Gastrointestinal:  Positive for nausea. Negative for vomiting.  Neurological: Negative.      Physical Exam Triage Vital Signs ED Triage Vitals  Enc Vitals Group     BP 08/18/22 1226 (!) 158/94     Pulse Rate 08/18/22 1226 (!) 110     Resp 08/18/22 1226 (!) 22     Temp 08/18/22 1226 (!) 97.2 F (36.2 C)     Temp Source 08/18/22 1226 Oral     SpO2 08/18/22 1226 98 %     Weight --      Height --      Head Circumference --      Peak Flow --      Pain Score 08/18/22 1225 0     Pain Loc --      Pain Edu? --      Excl. in [place]? --    No data found.  Updated Vital Signs BP (!) 158/94 (BP Location: Left Arm)   Pulse (!) 110   Temp (!) 97.2 F (36.2 C) (Oral)   Resp (!) 22   SpO2 98%   Visual Acuity Right Eye Distance:   Left Eye Distance:   Bilateral Distance:    Right Eye Near:   Left Eye Near:    Bilateral Near:     Physical Exam Vitals and nursing note reviewed.  Constitutional:      General: He is in acute distress.     Appearance: He is ill-appearing.  Cardiovascular:     Rate and Rhythm: Regular rhythm.  Pulmonary:     Effort: Pulmonary effort  is normal.     Breath sounds: Decreased breath sounds present. No wheezing, rhonchi or rales.     Comments: Decreased air entry in the lung bases bilaterally.  Abdominal:     General: Bowel sounds are normal.     Palpations: Abdomen is soft.  Musculoskeletal:     Cervical back: Normal range of motion and neck supple.  Neurological:     Mental Status: He is alert.      UC Treatments / Results  Labs (all labs ordered are listed, but only abnormal results  are displayed) Labs Reviewed  CBC WITH DIFFERENTIAL/PLATELET  COMPREHENSIVE METABOLIC PANEL    EKG   Radiology DG Chest 2 View  Result Date: 08/18/2022 CLINICAL DATA:  Cough, shortness of breath, history asthma EXAM: CHEST - 2 VIEW COMPARISON:  02/11/2020 FINDINGS: Normal heart size, mediastinal contours, and pulmonary vascularity. Atherosclerotic calcification aorta. Bronchitic changes and hyperinflation which may reflect asthma or COPD. No acute infiltrate, pleural effusion, or pneumothorax. Scattered endplate spur formation thoracic spine. IMPRESSION: Hyperinflated lungs with peribronchial thickening which could reflect asthma or COPD. No acute abnormalities. Aortic Atherosclerosis (ICD10-I70.0). Electronically Signed   By: Lavonia Dana M.D.   On: 08/18/2022 13:18    Procedures Procedures (including critical care time)  Medications Ordered in UC Medications  methylPREDNISolone sodium succinate (SOLU-MEDROL) 125 mg/2 mL injection 60 mg (has no administration in time range)  ipratropium-albuterol (DUONEB) 0.5-2.5 (3) MG/3ML nebulizer solution 3 mL (has no administration in time range)  ondansetron (ZOFRAN) injection 4 mg (4 mg Intramuscular Given 08/18/22 1314)    Initial Impression / Assessment and Plan / UC Course  I have reviewed the triage vital signs and the nursing notes.  Pertinent labs & imaging results that were available during my care of the patient were reviewed by me and considered in my medical decision making (see chart for details).     1.  COPD with acute exacerbation: Chest x-ray is negative for acute lung infiltrate Solu-Medrol 60 mg IM x 1 dose DuoNeb nebulizer solution Prednisone 40 mg orally daily for 5 days Doxycycline 100 mg twice daily for 7 days Zofran ODT 4 mg every 6 hours as needed for nausea/vomiting Maintain adequate hydration Patient encouraged to use medications as prescribed Return precautions given. Final Clinical Impressions(s) / UC  Diagnoses   Final diagnoses:  COPD with acute exacerbation [facility])     Discharge Instructions      Please use your albuterol inhaler and nebulizer machine at home Please take medications as prescribed Chest x-ray is negative for pneumonia Will call you with recommendations if labs are abnormal Return to urgent care if symptoms worsen.   ED Prescriptions     Medication Sig Dispense Auth. Provider   predniSONE (DELTASONE) 20 MG tablet Take 2 tablets (40 mg total) by mouth daily for 5 days. 10 tablet Shavonta Gossen, Myrene Galas, MD   doxycycline (VIBRAMYCIN) 100 MG capsule Take 1 capsule (100 mg total) by mouth 2 (two) times daily for 7 days. 14 capsule Rontae Inglett, Myrene Galas, MD   ondansetron (ZOFRAN-ODT) 4 MG disintegrating tablet Take 1 tablet (4 mg total) by mouth every 8 (eight) hours as needed for nausea or vomiting. 20 tablet Joanie Duprey, Myrene Galas, MD      PDMP not reviewed this encounter.   Chase Picket, MD 08/18/22 [phone]

## 2022-09-02 ENCOUNTER — Ambulatory Visit (HOSPITAL_BASED_OUTPATIENT_CLINIC_OR_DEPARTMENT_OTHER): Payer: 59

## 2022-09-04 ENCOUNTER — Inpatient Hospital Stay: Payer: 59

## 2022-09-04 ENCOUNTER — Inpatient Hospital Stay: Payer: 59 | Admitting: Nurse Practitioner

## 2022-09-13 ENCOUNTER — Ambulatory Visit (HOSPITAL_BASED_OUTPATIENT_CLINIC_OR_DEPARTMENT_OTHER)
Admission: RE | Admit: 2022-09-13 | Discharge: 2022-09-13 | Disposition: A | Discharge status: Home / Self Care | Payer: 59 | Source: Ambulatory Visit | Attending: Oncology | Admitting: Oncology

## 2022-09-13 DIAGNOSIS — C184 Malignant neoplasm of transverse colon: Secondary | ICD-10-CM | POA: Diagnosis present

## 2022-09-15 ENCOUNTER — Encounter: Payer: Self-pay | Admitting: Nurse Practitioner

## 2022-09-15 ENCOUNTER — Inpatient Hospital Stay (HOSPITAL_BASED_OUTPATIENT_CLINIC_OR_DEPARTMENT_OTHER): Payer: 59 | Admitting: Nurse Practitioner

## 2022-09-15 ENCOUNTER — Inpatient Hospital Stay: Payer: 59 | Attending: Oncology

## 2022-09-15 VITALS — BP 134/79 | HR 90 | Temp 98.2°F | Resp 18 | Ht 71.0 in | Wt 154.0 lb

## 2022-09-15 DIAGNOSIS — C184 Malignant neoplasm of transverse colon: Secondary | ICD-10-CM

## 2022-09-15 DIAGNOSIS — G620 Drug-induced polyneuropathy: Secondary | ICD-10-CM | POA: Insufficient documentation

## 2022-09-15 DIAGNOSIS — N261 Atrophy of kidney (terminal): Secondary | ICD-10-CM | POA: Diagnosis not present

## 2022-09-15 DIAGNOSIS — D509 Iron deficiency anemia, unspecified: Secondary | ICD-10-CM | POA: Insufficient documentation

## 2022-09-15 DIAGNOSIS — R918 Other nonspecific abnormal finding of lung field: Secondary | ICD-10-CM | POA: Insufficient documentation

## 2022-09-15 DIAGNOSIS — Z79899 Other long term (current) drug therapy: Secondary | ICD-10-CM | POA: Insufficient documentation

## 2022-09-15 DIAGNOSIS — T451X5A Adverse effect of antineoplastic and immunosuppressive drugs, initial encounter: Secondary | ICD-10-CM | POA: Diagnosis not present

## 2022-09-15 DIAGNOSIS — M40209 Unspecified kyphosis, site unspecified: Secondary | ICD-10-CM | POA: Insufficient documentation

## 2022-09-15 DIAGNOSIS — J45909 Unspecified asthma, uncomplicated: Secondary | ICD-10-CM | POA: Diagnosis not present

## 2022-09-15 DIAGNOSIS — R0600 Dyspnea, unspecified: Secondary | ICD-10-CM | POA: Insufficient documentation

## 2022-09-15 DIAGNOSIS — I700 Atherosclerosis of aorta: Secondary | ICD-10-CM | POA: Diagnosis not present

## 2022-09-15 DIAGNOSIS — Z8679 Personal history of other diseases of the circulatory system: Secondary | ICD-10-CM | POA: Insufficient documentation

## 2022-09-15 DIAGNOSIS — I5022 Chronic systolic (congestive) heart failure: Secondary | ICD-10-CM | POA: Insufficient documentation

## 2022-09-15 DIAGNOSIS — R0789 Other chest pain: Secondary | ICD-10-CM | POA: Diagnosis not present

## 2022-09-15 LAB — CEA (ACCESS): CEA (CHCC): 1.22 ng/mL (ref 0.00–5.00)

## 2022-09-15 NOTE — Progress Notes (Signed)
[facility] OFFICE PROGRESS NOTE   Diagnosis: Colon cancer  INTERVAL HISTORY:   Mr. Bufkin returns as scheduled.  He reports shortness of breath and a cough about a month ago.  The cough persisted for about 3 weeks.  Appetite was poor during this time.  Cough has now resolved.  Dyspnea has returned to baseline.  Appetite is improving.  He confirms he has not smoked in 11 years.  No change in bowel habits.  No bleeding.  The throat pain he was experiencing at the time of his last office visit in November 2023 has resolved.  Objective:  Vital signs in last 24 hours:  Blood pressure 134/79, pulse 90, temperature 98.2 F (36.8 C), temperature source Oral, resp. rate 18, height 5' 11"$  (1.803 m), weight 154 lb (69.9 kg), SpO2 98 %.    HEENT: No thrush or ulcers. Lymphatics: No palpable cervical, supraclavicular or axillary lymph nodes.   Resp: Distant breath sounds, lungs are clear. Cardio: Regular rate and rhythm. GI: No hepatosplenomegaly. Vascular: No leg edema.   Lab Results:  Lab Results  Component Value Date   WBC 4.8 08/18/2022   HGB 14.9 08/18/2022   HCT 43.9 08/18/2022   MCV 85.7 08/18/2022   PLT 221 08/18/2022   NEUTROABS 2.6 08/18/2022    Imaging:  CT CHEST WO CONTRAST  Result Date: 09/15/2022 CLINICAL DATA:  Lung nodule, colon cancer. Shortness of breath and chest tightness. EXAM: CT CHEST WITHOUT CONTRAST TECHNIQUE: Multidetector CT imaging of the chest was performed following the standard protocol without IV contrast. RADIATION DOSE REDUCTION: This exam was performed according to the departmental dose-optimization program which includes automated exposure control, adjustment of the mA and/or kV according to patient size and/or use of iterative reconstruction technique. COMPARISON:  05/30/2022, 05/27/2021 and 06/11/2020. FINDINGS: Cardiovascular: Atherosclerotic calcification of the aorta. Heart size normal. No pericardial effusion.  Mediastinum/Nodes: No pathologically enlarged mediastinal or axillary lymph nodes. Hilar regions are difficult to definitively evaluate without IV contrast. Esophagus is grossly unremarkable. Lungs/Pleura: Biapical pleuroparenchymal scarring. Markedly increased ill-defined centrilobular nodularity, somewhat upper and midlung zone predominant. Previously questioned 4 mm right upper lobe ground-glass nodule is not appreciated. Areas of subsegmental volume loss in the lung bases. 3 mm lateral right lower lobe nodule (4/99), new from 05/30/2022. No pleural fluid. Airway is unremarkable. Upper Abdomen: Visualized portions of the liver and adrenal glands are grossly unremarkable. Partially imaged atrophic right kidney. Visualized portions of the left kidney, spleen, pancreas and stomach are grossly unremarkable. No upper abdominal adenopathy. Musculoskeletal: Degenerative changes in the spine. No worrisome lytic or sclerotic lesions. Kyphosis. IMPRESSION: 1. Markedly increased upper and midlung zone predominant ill-defined centrilobular nodularity which may be due to smoking related respiratory bronchiolitis (if the patient has resumed smoking) or nonfibrotic hypersensitivity pneumonitis. 2. 3 mm lateral right lower lobe nodule, new in the interval. Recommend continued attention on follow-up. 3. Previously questioned 4 mm ground-glass right upper lobe nodule is not appreciated. 4.  Aortic atherosclerosis (ICD10-I70.0). Electronically Signed   By: Lorin Picket M.D.   On: 09/15/2022 08:46    Medications: I have reviewed the patient's current medications.  Assessment/Plan: Colon cancer, transverse colon, stage IIIc, T4bN2, status post a partial transverse colectomy 06/30/2019 Grade 3, lymphovascular and perineural invasion present, 4/18 lymph nodes positive, positive lymph nodes mesenteric margin, tumor invades into adherent omentum, MSI high, loss of MLH1 and PMS2 expression; MLH1 methylation not detected; BRAF  mutation analysis-negative CT abdomen/pelvis 06/26/2019-thickening within the distal transverse colon, severely  atrophic and calcified right kidney CT chest 06/28/2019-no evidence of metastatic disease, stable 3 mm right lower lobe nodule Cycle 1 FOLFOX 08/04/2019 (oxaliplatin 65 mg/m due to renal function) Plan for repeat CTs after he has completed 5 cycles of chemotherapy Cycle 2 FOLFOX 08/18/2019 Cycle 3 FOLFOX 08/31/2019, Udenyca added Cycle 4 FOLFOX 10/12/2019 Cycle 5 FOLFOX 10/25/2019 Stable right lower lobe nodule on chest CT 09/21/2019 CT abdomen/pelvis 11/07/2019-postoperative changes related to colectomy and anastomosis.  No current evidence of disease.  No adenopathy. Cycle 6 FOLFOX 11/08/2019 Cycle 7 FOLFOX 11/23/2019 Cycle 8 FOLFOX 12/06/2019 Cycle 9 FOLFOX 12/20/2019 Case presented GI tumor conference 12/28/2018-tumor margin negative, positive lymph node at margin; no recommendation for further surgery or radiation; peripancreatic node on baseline CT not seen on April 2021 CT Cycle 10 FOLFOX 01/03/2020  Cycle 11 FOLFOX 01/17/2020-oxaliplatin and Udenyca held Cycle 12 FOLFOX 01/31/2020-oxaliplatin and Udenyca held, 5-FU dose reduced secondary to mucositis 05/25/2020 colonoscopy-internal hemorrhoids, repeat colonoscopy in 3 years for surveillance CTs 06/11/2020-new trace right pelvic fluid, nonspecific; new mild left upper lobe interstitial/groundglass opacity suspicious for minimal infection or inflammation; similar 2 mm right lower lobe pulmonary nodule CTs 05/23/2021-no evidence of recurrent colon cancer CTs 05/30/2022-nonspecific groundglass opacities in the bilateral upper lobes, new 4 mm groundglass right upper lobe nodule, no evidence of metastatic disease CT chest 09/13/2022-markedly increased upper and mid lung zone predominant ill-defined centrilobular nodularity.  New 3 mm lateral right lower lobe nodule.  Previously questioned 4 mm groundglass right upper lobe nodule not  appreciated. Asthma Chronic systolic heart failure History of PVCs Atrophic right kidney Microcytic anemia, likely iron deficiency anemia secondary to #1 Port-A-Cath placement on 12/15/2019, Dr. 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 appears well.  The recent follow-up chest CT showed resolution of the previously questioned 4 mm right upper lobe nodule.  A new 3 mm right lower lobe nodule was noted in addition to an increase in ill-defined centrilobular nodularity upper and midlung zone.  Report/images reviewed with Mr. Siam at today's visit.  Plan for repeat noncontrast chest CT in 6 months.  Some of the changes on the CT scan may be related to a recent respiratory illness.  CEA from today is stable in normal range.  He will return for follow-up in 6 months, CT chest a few days prior.  He will contact the office in the interim with any problems.  Patient seen with Dr. [name].  [name] ANP/GNP-BC   09/15/2022  11:14 AM  This was a shared visit with [name].  We reviewed the CT findings and images with Mr. Elizardo.  He is in clinical remission from colon cancer.  The parenchymal lung changes on CT are likely related to inflammation.  He will return for an office visit and chest CT in 6 months.  I was present for greater than 50% of today's visit.  I performed medical decision making.  Julieanne Manson, MD

## 2022-09-29 ENCOUNTER — Other Ambulatory Visit (HOSPITAL_COMMUNITY): Payer: Self-pay | Admitting: Cardiovascular Disease

## 2022-09-30 ENCOUNTER — Encounter: Payer: Self-pay | Admitting: Oncology

## 2022-10-07 ENCOUNTER — Ambulatory Visit: Payer: 59 | Admitting: Gastroenterology

## 2022-10-07 NOTE — Progress Notes (Deleted)
HPI : last seen [id number]/6250  72 year old male here for follow-up visit for abdominal pain and constipation.  He is known to me for history of colon cancer which was diagnosed in November 2020, he is status post surgery and chemotherapy and thought to be in remission currently.   He states for the past 3 months his stomach is bothering him.  He complains of epigastric discomfort that can come and go.  He feels a discomfort in his epigastric area, associated with pyrosis and regurgitation.  He states it actually worse when he is not eating, eating can help settle his stomach at times.  He does have some nausea and previously had a few episodes of vomiting but has not had vomiting recently.  He has had some constipation during this time.  He is having a bowel movement about once every 3 days.  Associated with this he has some abdominal bloating.  Sometimes he does pass a hard stool and in the setting will have scant amount of red blood.  He denies any dark or black stool.  His primary care did Hemoccult testing which was positive.  The patient had a colonoscopy with me on October 29 of last year which showed no polyps and internal hemorrhoids.  He is currently not taking anything for constipation. He had a negative H. pylori serology test with his primary care.  His hemoglobin was stable at 13.4.  He was given a trial of omeprazole 40 mg once daily and states this has provided some benefit and things have improved but not yet resolved.  He denies any NSAID use, uses only Tylenol as needed.  He has never had a prior upper endoscopy.  He had surveillance CT scan in light of his colon cancer back in November which did not show any concerning lesions.  He recently had an echocardiogram which shows normal ejection fraction as outlined below.  Cardiac cath also done in 2020 without any significant CAD.    Labs 08/16/20 Hgb 13.4, plt 211, WBC 6.4, MCV 87 H pylori IgG negative Fecal occult blood (+)        Colonoscopy 05/25/2020 - The perianal and digital rectal examinations were normal. - The terminal ileum appeared normal. - There was evidence of a prior end-to-end colo-colonic anastomosis in the transverse colon. This was patent and was characterized by healthy appearing mucosa. A portion of a buried staple was visualized at the anastomosis. - Internal hemorrhoids were found during retroflexion. The hemorrhoids were small. - The exam was otherwise without abnormality. No polyps.   Repeat colonoscopy in 3 years     Colonoscopy 06/27/2019 - Preparation of the colon was poor, exam inadequate for ensuring no other polyps noted, however none visualized. - Likely malignant partially obstructing tumor in the distal transverse colon, could not traverse it due to luminal narrowing. Biopsied. - Internal hemorrhoids.     Echo 09/20/20 - EF 55-60%, grade I DD     CT chest / abdomen / pelvis 06/11/20 - IMPRESSION: 1. Surgical changes within the transverse colon, without recurrent or metastatic disease. 2. New trace right pelvic fluid, nonspecific. 3. New mild left upper lobe interstitial/ground-glass opacity, suspicious for minimal infection or inflammation. Correlate with infectious symptoms. 4. Aortic Atherosclerosis (ICD10-I70.0).   EGD 10/31/20: - A small hiatal hernia was present. - The Z-line was regular but there appeared to be a short segment of circumferential salmon colored mucosa above the GEJ, concerning for possible short segment Barrett's. Biopsies not taken given significant  food burden in the stomach. - The exam of the esophagus was otherwise normal. - A large amount of food was found in the gastric fundus, in the gastric body and in the gastric antrum. This prohibited full exam of the stomach and given risk for aspiration the procedure was aborted quickly once this was observed.  EGD 02/05/21: - Esophagogastric landmarks were identified: the Z-line was found at 39 cm, the  gastroesophageal junction was found at 40 cm and the upper extent of the gastric folds was found at 42 cm from the incisors. Findings: - The Z-line was irregular with a tongue of salmon colored mucosa and was found 39 cm from the incisors. Biopsies were taken with a cold forceps for histology. - The exam of the esophagus was otherwise normal. - Patchy mildly erythematous mucosa was found in the gastric body. - The proximal stomach did not retain air well at all, retroflexed views of the cardia quite difficult to obtain due to this issue. The exam of the stomach was otherwise normal. - Biopsies were taken with a cold forceps in the gastric body, at the incisura and in the gastric antrum for Helicobacter pylori testing. - The duodenal bulb and second portion of the duodenum were normal.  Diagnosis 1. Surgical [P], gastric antrum and gastric body - MILD REACTIVE GASTROPATHY AND CHRONIC INFLAMMATION. [place] IS NEGATIVE FOR HELICOBACTER PYLORI. - NO INTESTINAL METAPLASIA, DYSPLASIA, OR MALIGNANCY. 2. Surgical [P], distal esophagus - REACTIVE GASTRIC TYPE MUCOSA WITH CHRONIC INFLAMMATION CONSISTENT WITH REFLUX. - NO INTESTINAL METAPLASIA, DYSPLASIA, OR MALIGNANCY.  Tried empiric Reglan   CT scan abdomen / pelvis 05/30/22: IMPRESSION: 1. Prior partial transverse colonic resection with reanastomosis without evidence of local recurrence. 2. Similar scattered nonspecific ground-glass opacities in the bilateral upper lobes with a new 4 mm ground-glass pulmonary nodule in the right upper lobe, favored infectious or inflammatory. Consider attention on dedicated short-term interval follow-up chest CT. 3. No convincing evidence of metastatic disease in the chest, abdomen or pelvis. 4. Aortic Atherosclerosis (ICD10-I70.0) and Emphysema (ICD10-J43.9).    Past Medical History:  Diagnosis Date   Alcoholism ([facility])    Allergy    Arthritis    hips, L shoulder   Asthma    Cancer ([facility]) 05/2019    colon cancer   CHF (congestive heart failure) ([facility])    Chronic kidney disease    only has one kidney   COPD (chronic obstructive pulmonary disease) ([facility])    COVID-19    Depression    pt denies   Dyspnea    Dysrhythmia    PVC's   Family history of anesthesia complication    GERD (gastroesophageal reflux disease)    Heart murmur    Hyperlipidemia    Hypertension    Myocardial infarction [facility])    pt is unsure of date- believes early 2020   Osteoporosis    Pneumonia    Substance abuse ([facility])    alcoholism over 10 yrs ago     Past Surgical History:  Procedure Laterality Date   BIOPSY  06/27/2019   Procedure: BIOPSY;  Surgeon: Yetta Flock, MD;  Location: [facility] ENDOSCOPY;  Service: Gastroenterology;;   CATARACT EXTRACTION     COLONOSCOPY Left 06/27/2019   Procedure: COLONOSCOPY;  Surgeon: Yetta Flock, MD;  Location: [facility] ENDOSCOPY;  Service: Gastroenterology;  Laterality: Left;   COLONOSCOPY     LAPAROTOMY N/A 06/30/2019   Procedure: EXPLORATORY LAPAROTOMY;  Surgeon: Ralene Ok, MD;  Location: [facility];  Service: General;  Laterality: N/A;   LEFT HEART CATH AND CORONARY ANGIOGRAPHY N/A 10/04/2018   Procedure: LEFT HEART CATH AND CORONARY ANGIOGRAPHY;  Surgeon: Jettie Booze, MD;  Location: [facility] CV LAB;  Service: Cardiovascular;  Laterality: N/A;   PARTIAL COLECTOMY N/A 06/30/2019   Procedure: PARTIAL COLECTOMY;  Surgeon: Ralene Ok, MD;  Location: [facility];  Service: General;  Laterality: N/A;   PORTACATH PLACEMENT Left 08/02/2019   Procedure: INSERTION PORT-A-CATH LEFT CHEST;  Surgeon: Ralene Ok, MD;  Location: [facility];  Service: General;  Laterality: Left;   RIGHT/LEFT HEART CATH AND CORONARY ANGIOGRAPHY N/A 06/12/2017   Procedure: RIGHT/LEFT HEART CATH AND CORONARY ANGIOGRAPHY;  Surgeon: Jolaine Artist, MD;  Location: [facility] CV LAB;  Service: Cardiovascular;  Laterality: N/A;   Family History  Problem Relation Age of Onset   Heart  disease Father    Heart disease Daughter        "fluid around heart"    Canavan disease Daughter    Cancer Daughter        unsure of type   Asthma Son    Heart failure Mother    Kidney failure Mother    Colon cancer Neg Hx    Esophageal cancer Neg Hx    Rectal cancer Neg Hx    Stomach cancer Neg Hx    Social History   Tobacco Use   Smoking status: Former    Packs/day: 0.10    Years: 45.00    Total pack years: 4.50    Types: Cigarettes    Quit date: 2012    Years since quitting: 12.2   Smokeless tobacco: Never  Vaping Use   Vaping Use: Never used  Substance Use Topics   Alcohol use: No    Comment: Pt states no etoh since April 2014   Drug use: Not Currently    Comment: Prior use of crack cocaine, quit 2012   Current Outpatient Medications  Medication Sig Dispense Refill   albuterol (VENTOLIN HFA) 108 (90 Base) MCG/ACT inhaler Inhale 2 puffs into the lungs every 4 (four) hours as needed for wheezing or shortness of breath. 18 g 0   amiodarone (PACERONE) 200 MG tablet TAKE 1/2 TABLET (100 MG TOTAL) BY MOUTH DAILY(AM) 45 tablet 3   amLODipine (NORVASC) 5 MG tablet TAKE 1 TABLET (5 MG TOTAL) BY MOUTH DAILY (AM) 180 tablet 3   atorvastatin (LIPITOR) 10 MG tablet Take 10 mg by mouth at bedtime.     benzonatate (TESSALON) 100 MG capsule Take 1 capsule (100 mg total) by mouth 2 (two) times daily as needed for cough. (Patient not taking: Reported on 09/15/2022) 20 capsule 0   budesonide-formoterol (SYMBICORT) 160-4.5 MCG/ACT inhaler Inhale 2 puffs into the lungs 2 (two) times daily. 1 Inhaler 11   carvedilol (COREG) 3.125 MG tablet TAKE 1 TABLET (3.125 MG TOTAL) BY MOUTH 2 (TWO) TIMES DAILY WITH A MEAL. 180 tablet 3   fluticasone (FLONASE) 50 MCG/ACT nasal spray Place 2 sprays into both nostrils daily as needed for allergies or rhinitis.     furosemide (LASIX) 20 MG tablet TAKE 1 TABLET (20 MG TOTAL) BY MOUTH DAILY (AM) 90 tablet 3   ipratropium-albuterol (DUONEB) 0.5-2.5 (3) MG/3ML  SOLN Take 3 mLs by nebulization every 4 (four) hours as needed. 120 mL 0   isosorbide-hydrALAZINE (BIDIL) 20-37.5 MG tablet TAKE 1 TABLET BY MOUTH 3 (THREE) TIMES DAILY (AM+NOON+BEDTIME) 270 tablet 3   losartan (COZAAR) 50 MG tablet TAKE 1 TABLET (50 MG TOTAL) BY  MOUTH DAILY (AM) 90 tablet 3   montelukast (SINGULAIR) 10 MG tablet Take 10 mg by mouth at bedtime.     omeprazole (PRILOSEC) 40 MG capsule Take 1 capsule (40 mg total) by mouth daily. (Patient not taking: Reported on 09/15/2022) 30 capsule 0   ondansetron (ZOFRAN-ODT) 4 MG disintegrating tablet Take 1 tablet (4 mg total) by mouth every 8 (eight) hours as needed for nausea or vomiting. (Patient not taking: Reported on 09/15/2022) 20 tablet 0   TRELEGY ELLIPTA 100-62.5-25 MCG/ACT AEPB Inhale 1 puff into the lungs daily.     No current facility-administered medications for this visit.   Allergies  Allergen Reactions   Clarithromycin Shortness Of Breath   Lisinopril Swelling    Facial and tongue swelling 10/2012     Review of Systems: All systems reviewed and negative except where noted in HPI.    CT CHEST WO CONTRAST  Result Date: 09/15/2022 CLINICAL DATA:  Lung nodule, colon cancer. Shortness of breath and chest tightness. EXAM: CT CHEST WITHOUT CONTRAST TECHNIQUE: Multidetector CT imaging of the chest was performed following the standard protocol without IV contrast. RADIATION DOSE REDUCTION: This exam was performed according to the departmental dose-optimization program which includes automated exposure control, adjustment of the mA and/or kV according to patient size and/or use of iterative reconstruction technique. COMPARISON:  05/30/2022, 05/27/2021 and 06/11/2020. FINDINGS: Cardiovascular: Atherosclerotic calcification of the aorta. Heart size normal. No pericardial effusion. Mediastinum/Nodes: No pathologically enlarged mediastinal or axillary lymph nodes. Hilar regions are difficult to definitively evaluate without IV contrast.  Esophagus is grossly unremarkable. Lungs/Pleura: Biapical pleuroparenchymal scarring. Markedly increased ill-defined centrilobular nodularity, somewhat upper and midlung zone predominant. Previously questioned 4 mm right upper lobe ground-glass nodule is not appreciated. Areas of subsegmental volume loss in the lung bases. 3 mm lateral right lower lobe nodule (4/99), new from 05/30/2022. No pleural fluid. Airway is unremarkable. Upper Abdomen: Visualized portions of the liver and adrenal glands are grossly unremarkable. Partially imaged atrophic right kidney. Visualized portions of the left kidney, spleen, pancreas and stomach are grossly unremarkable. No upper abdominal adenopathy. Musculoskeletal: Degenerative changes in the spine. No worrisome lytic or sclerotic lesions. Kyphosis. IMPRESSION: 1. Markedly increased upper and midlung zone predominant ill-defined centrilobular nodularity which may be due to smoking related respiratory bronchiolitis (if the patient has resumed smoking) or nonfibrotic hypersensitivity pneumonitis. 2. 3 mm lateral right lower lobe nodule, new in the interval. Recommend continued attention on follow-up. 3. Previously questioned 4 mm ground-glass right upper lobe nodule is not appreciated. 4.  Aortic atherosclerosis (ICD10-I70.0). Electronically Signed   By: Lorin Picket M.D.   On: 09/15/2022 08:46    Physical Exam: There were no vitals taken for this visit. Constitutional: Pleasant,well-developed, ***male in no acute distress. HEENT: Normocephalic and atraumatic. Conjunctivae are normal. No scleral icterus. Neck supple.  Cardiovascular: Normal rate, regular rhythm.  Pulmonary/chest: Effort normal and breath sounds normal. No wheezing, rales or rhonchi. Abdominal: Soft, nondistended, nontender. Bowel sounds active throughout. There are no masses palpable. No hepatomegaly. Extremities: no edema Lymphadenopathy: No cervical adenopathy noted. Neurological: Alert and  oriented to person place and time. Skin: Skin is warm and dry. No rashes noted. Psychiatric: Normal mood and affect. Behavior is normal.   ASSESSMENT: 72 y.o. male here for assessment of the following  No diagnosis found.  PLAN:   Cipriano Mile, NP

## 2022-10-28 ENCOUNTER — Other Ambulatory Visit: Payer: Self-pay | Admitting: Cardiovascular Disease

## 2022-11-03 ENCOUNTER — Encounter (HOSPITAL_COMMUNITY): Payer: Self-pay

## 2022-11-03 ENCOUNTER — Ambulatory Visit (HOSPITAL_COMMUNITY)
Admission: EM | Admit: 2022-11-03 | Discharge: 2022-11-03 | Disposition: A | Discharge status: Home / Self Care | Payer: 59 | Attending: Internal Medicine | Admitting: Internal Medicine

## 2022-11-03 ENCOUNTER — Encounter: Payer: Self-pay | Admitting: Oncology

## 2022-11-03 DIAGNOSIS — K047 Periapical abscess without sinus: Secondary | ICD-10-CM | POA: Diagnosis not present

## 2022-11-03 MED ORDER — AMOXICILLIN-POT CLAVULANATE 875-125 MG PO TABS: 1.0000 | ORAL_TABLET | Freq: Two times a day (BID) | ORAL | 0 refills | Status: DC

## 2022-11-03 MED ORDER — TRAMADOL HCL 50 MG PO TABS: 50.0000 mg | ORAL_TABLET | Freq: Two times a day (BID) | ORAL | 0 refills | Status: DC | PRN

## 2022-11-03 NOTE — ED Triage Notes (Signed)
Pt is here for dental pain x 3days causing pain and swelling . Pt has been taking tylenol for pain , pt stated it did not helped.

## 2022-11-03 NOTE — Discharge Instructions (Addendum)
Please rinse mouth with antiseptic mouthwash Please take medications as prescribed Please make sure you complete the course of antibiotics Return to urgent care if you have any other concerns Please make an appointment with the dentist to have dental evaluation and further management.

## 2022-11-03 NOTE — ED Provider Notes (Signed)
[facility]    CSN: [id number] Arrival date & time: 11/03/22  1552      History   Chief Complaint Chief Complaint  Patient presents with   Dental Pain    HPI Joe Garcia is a 72 y.o. male comes to the urgent care with 4-day history of 7 out of 10 throbbing dental pain.  Pain started insidiously and has worsened over the past few days.  Pain is constant.  Aggravated by palpation of the right lower jaw.  It is not relieved by hydrogen peroxide mouthwash.  No fever or chills.  It is associated with swelling of the right lower jaw.  Patient called 3 different dental practices but was unable to get an appointment.  HPI  Past Medical History:  Diagnosis Date   Alcoholism    Allergy    Arthritis    hips, L shoulder   Asthma    Cancer 05/2019   colon cancer   CHF (congestive heart failure)    Chronic kidney disease    only has one kidney   COPD (chronic obstructive pulmonary disease)    COVID-19    Depression    pt denies   Dyspnea    Dysrhythmia    PVC's   Family history of anesthesia complication    GERD (gastroesophageal reflux disease)    Heart murmur    Hyperlipidemia    Hypertension    Myocardial infarction    pt is unsure of date- believes early 2020   Osteoporosis    Pneumonia    Substance abuse    alcoholism over 10 yrs ago    Patient Active Problem List   Diagnosis Date Noted   Hyperlipidemia 08/05/2022   Genetic testing 09/07/2020   Acute-on-chronic kidney injury 02/12/2020   Sepsis 02/12/2020   Pneumonia 02/12/2020   Multifocal pneumonia 02/11/2020   Port-A-Cath in place 08/04/2019   Chronic systolic heart failure    Colonic obstruction    Cancer of transverse colon s/p partial colectomy 06/30/2019    Nausea & vomiting 06/26/2019   Colonic mass 06/26/2019   COPD GOLD ?  11/24/2018   Chest pain    NSTEMI (non-ST elevated myocardial infarction) 10/03/2018   COPD with acute exacerbation 07/26/2018   ARF (acute renal failure)  07/26/2018   Nausea and vomiting 07/26/2018   Weight loss, unintentional 07/26/2018   Malnutrition of moderate degree 07/16/2017   CKD (chronic kidney disease), stage II 07/14/2017   Solitary kidney, congenital 06/23/2017   Nonischemic cardiomyopathy    Multifocal PVCs    Chronic combined systolic (congestive) and diastolic (congestive) heart failure 06/11/2017   Hypokalemia 05/05/2017   DOE (dyspnea on exertion) 07/16/2013   Alcohol abuse 09/25/2012   Alcohol dependence (HCC) 09/22/2012   Essential hypertension 03/22/2007   DEGENERATIVE DISC DISEASE 03/22/2007   AVASCULAR NECROSIS 03/22/2007    Past Surgical History:  Procedure Laterality Date   BIOPSY  06/27/2019   Procedure: BIOPSY;  Surgeon: Benancio Deeds, MD;  Location: [facility] ENDOSCOPY;  Service: Gastroenterology;;   CATARACT EXTRACTION     COLONOSCOPY Left 06/27/2019   Procedure: COLONOSCOPY;  Surgeon: Benancio Deeds, MD;  Location: [facility] ENDOSCOPY;  Service: Gastroenterology;  Laterality: Left;   COLONOSCOPY     LAPAROTOMY N/A 06/30/2019   Procedure: EXPLORATORY LAPAROTOMY;  Surgeon: Axel Filler, MD;  Location: [facility] OR;  Service: General;  Laterality: N/A;   LEFT HEART CATH AND CORONARY ANGIOGRAPHY N/A 10/04/2018   Procedure: LEFT HEART CATH AND CORONARY ANGIOGRAPHY;  Surgeon: Corky Crafts, MD;  Location: [facility] INVASIVE CV LAB;  Service: Cardiovascular;  Laterality: N/A;   PARTIAL COLECTOMY N/A 06/30/2019   Procedure: PARTIAL COLECTOMY;  Surgeon: Axel Filler, MD;  Location: [facility] OR;  Service: General;  Laterality: N/A;   PORTACATH PLACEMENT Left 08/02/2019   Procedure: INSERTION PORT-A-CATH LEFT CHEST;  Surgeon: Axel Filler, MD;  Location: [facility] OR;  Service: General;  Laterality: Left;   RIGHT/LEFT HEART CATH AND CORONARY ANGIOGRAPHY N/A 06/12/2017   Procedure: RIGHT/LEFT HEART CATH AND CORONARY ANGIOGRAPHY;  Surgeon: Dolores Patty, MD;  Location: [facility] INVASIVE CV LAB;  Service: Cardiovascular;   Laterality: N/A;       Home Medications    Prior to Admission medications   Medication Sig Start Date End Date Taking? Authorizing Provider  albuterol (VENTOLIN HFA) 108 (90 Base) MCG/ACT inhaler Inhale 2 puffs into the lungs every 4 (four) hours as needed for wheezing or shortness of breath. 07/09/20  Yes Rodriguez-Southworth, Nettie Elm, PA-C  amiodarone (PACERONE) 200 MG tablet TAKE 1/2 TABLET (100 MG TOTAL) BY MOUTH DAILY(AM) 08/01/22  Yes Runell Gess, MD  amLODipine (NORVASC) 5 MG tablet TAKE 1 TABLET (5 MG TOTAL) BY MOUTH DAILY (AM) 10/28/22  Yes Runell Gess, MD  amoxicillin-clavulanate (AUGMENTIN) 875-125 MG tablet Take 1 tablet by mouth every 12 (twelve) hours. 11/03/22  Yes Lillion Elbert, Britta Mccreedy, MD  atorvastatin (LIPITOR) 10 MG tablet Take 10 mg by mouth at bedtime.   Yes [provider]  budesonide-formoterol (SYMBICORT) 160-4.5 MCG/ACT inhaler Inhale 2 puffs into the lungs 2 (two) times daily. 11/23/18  Yes Nyoka Cowden, MD  carvedilol (COREG) 3.125 MG tablet TAKE 1 TABLET (3.125 MG TOTAL) BY MOUTH 2 (TWO) TIMES DAILY WITH A MEAL. (AM+BEDTIME) 10/28/22  Yes Runell Gess, MD  fluticasone [facility]) 50 MCG/ACT nasal spray Place 2 sprays into both nostrils daily as needed for allergies or rhinitis. 02/13/20  Yes Glade Lloyd, MD  furosemide (LASIX) 20 MG tablet TAKE 1 TABLET (20 MG TOTAL) BY MOUTH DAILY (AM) 10/28/22  Yes Runell Gess, MD  ipratropium-albuterol (DUONEB) 0.5-2.5 (3) MG/3ML SOLN Take 3 mLs by nebulization every 4 (four) hours as needed. 08/01/22  Yes Crain, Whitney L, PA  isosorbide-hydrALAZINE (BIDIL) 20-37.5 MG tablet TAKE 1 TABLET BY MOUTH 3 (THREE) TIMES DAILY (AM+NOON+BEDTIME) 10/28/22  Yes Runell Gess, MD  losartan (COZAAR) 50 MG tablet TAKE 1 TABLET (50 MG TOTAL) BY MOUTH DAILY (AM) 09/29/22  Yes Runell Gess, MD  montelukast (SINGULAIR) 10 MG tablet Take 10 mg by mouth at bedtime.   Yes [provider]  traMADol (ULTRAM) 50 MG tablet  Take 1 tablet (50 mg total) by mouth every 12 (twelve) hours as needed. 11/03/22  Yes Esli Clements, Britta Mccreedy, MD  benzonatate (TESSALON) 100 MG capsule Take 1 capsule (100 mg total) by mouth 2 (two) times daily as needed for cough. Patient not taking: Reported on 09/15/2022 08/01/22   Guy Sandifer L, PA  omeprazole (PRILOSEC) 40 MG capsule Take 1 capsule (40 mg total) by mouth daily. Patient not taking: Reported on 09/15/2022 12/23/21   Cathren Laine, MD  ondansetron (ZOFRAN-ODT) 4 MG disintegrating tablet Take 1 tablet (4 mg total) by mouth every 8 (eight) hours as needed for nausea or vomiting. Patient not taking: Reported on 09/15/2022 08/18/22   Merrilee Jansky, MD  TRELEGY ELLIPTA 100-62.5-25 MCG/ACT AEPB Inhale 1 puff into the lungs daily. 09/11/22   [provider]    Family History Family History  Problem Relation Age of Onset   Heart disease Father    Heart disease Daughter        "fluid around heart"    Canavan disease Daughter    Cancer Daughter        unsure of type   Asthma Son    Heart failure Mother    Kidney failure Mother    Colon cancer Neg Hx    Esophageal cancer Neg Hx    Rectal cancer Neg Hx    Stomach cancer Neg Hx     Social History Social History   Tobacco Use   Smoking status: Former    Packs/day: 0.10    Years: 45.00    Additional pack years: 0.00    Total pack years: 4.50    Types: Cigarettes    Quit date: 2012    Years since quitting: 12.2   Smokeless tobacco: Never  Vaping Use   Vaping Use: Never used  Substance Use Topics   Alcohol use: No    Comment: Pt states no etoh since April 2014   Drug use: Not Currently    Comment: Prior use of crack cocaine, quit 2012     Allergies   Clarithromycin and Lisinopril   Review of Systems Review of Systems As per HPI  Physical Exam Triage Vital Signs ED Triage Vitals  Enc Vitals Group     BP 11/03/22 1041 (!) 145/78     Pulse Rate 11/03/22 1041 (!) 52     Resp 11/03/22 1041 12     Temp  11/03/22 1041 98 F (36.7 C)     Temp Source 11/03/22 1041 Oral     SpO2 11/03/22 1041 98 %     Weight --      Height --      Head Circumference --      Peak Flow --      Pain Score 11/03/22 1043 7     Pain Loc --      Pain Edu? --      Excl. in GC? --    No data found.  Updated Vital Signs BP (!) 145/78 (BP Location: Right Arm)   Pulse (!) 52   Temp 98 F (36.7 C) (Oral)   Resp 12   SpO2 98%   Visual Acuity Right Eye Distance:   Left Eye Distance:   Bilateral Distance:    Right Eye Near:   Left Eye Near:    Bilateral Near:     Physical Exam Vitals and nursing note reviewed.  Constitutional:      General: He is in acute distress.  HENT:     Right Ear: Tympanic membrane normal.     Left Ear: Tympanic membrane normal.     Mouth/Throat:     Mouth: Mucous membranes are moist.     Comments: Poor dental hygiene.  Right second mandibular molar has significant amount of caries.  No obvious cavities seen.  Around that tooth is tender and boggy.  No fluctuance of the gum. Neurological:     Mental Status: He is alert.      UC Treatments / Results  Labs (all labs ordered are listed, but only abnormal results are displayed) Labs Reviewed - No data to display  EKG   Radiology No results found.  Procedures Procedures (including critical care time)  Medications Ordered in UC Medications - No data to display  Initial Impression / Assessment and Plan / UC Course  I have reviewed the  triage vital signs and the nursing notes.  Pertinent labs & imaging results that were available during my care of the patient were reviewed by me and considered in my medical decision making (see chart for details).     1.  Dental infection: Augmentin 875/125 mg twice daily for 7 days Tramadol 50 mg twice daily as needed for pain Warm salt water mouth rinse and antiseptic mouthwash use as needed Follow-up with dentist recommended. Final Clinical Impressions(s) / UC Diagnoses    Final diagnoses:  Dental infection     Discharge Instructions      Please rinse mouth with antiseptic mouthwash Please take medications as prescribed Please make sure you complete the course of antibiotics Return to urgent care if you have any other concerns Please make an appointment with the dentist to have dental evaluation and further management.   ED Prescriptions     Medication Sig Dispense Auth. Provider   amoxicillin-clavulanate (AUGMENTIN) 875-125 MG tablet Take 1 tablet by mouth every 12 (twelve) hours. 14 tablet Nanette Wirsing, Britta Mccreedy, MD   traMADol (ULTRAM) 50 MG tablet Take 1 tablet (50 mg total) by mouth every 12 (twelve) hours as needed. 10 tablet Aayan Haskew, Britta Mccreedy, MD      I have reviewed the PDMP during this encounter.   Merrilee Jansky, MD 11/03/22 [phone]

## 2022-11-11 ENCOUNTER — Ambulatory Visit: Payer: 59 | Admitting: Orthopaedic Surgery

## 2022-11-13 ENCOUNTER — Other Ambulatory Visit: Payer: Self-pay

## 2022-11-13 ENCOUNTER — Other Ambulatory Visit (INDEPENDENT_AMBULATORY_CARE_PROVIDER_SITE_OTHER): Payer: 59

## 2022-11-13 ENCOUNTER — Ambulatory Visit (INDEPENDENT_AMBULATORY_CARE_PROVIDER_SITE_OTHER): Payer: 59 | Admitting: Orthopaedic Surgery

## 2022-11-13 ENCOUNTER — Encounter: Payer: Self-pay | Admitting: Orthopaedic Surgery

## 2022-11-13 VITALS — Ht 71.0 in | Wt 158.0 lb

## 2022-11-13 DIAGNOSIS — M1612 Unilateral primary osteoarthritis, left hip: Secondary | ICD-10-CM

## 2022-11-13 DIAGNOSIS — M1611 Unilateral primary osteoarthritis, right hip: Secondary | ICD-10-CM

## 2022-11-13 NOTE — Progress Notes (Signed)
Office Visit Note   Patient: Joe Garcia           Date of Birth: 1950-12-30           MRN: [id number] Visit Date: 11/13/2022              Requested by: Medicine, [facility] [address] [place],  [place] [address] PCP: Hillery Aldo, NP   Assessment & Plan: Visit Diagnoses:  1. Primary osteoarthritis of right hip   2. Primary osteoarthritis of left hip     Plan: Impression is severe right hip degenerative joint disease secondary to Osteoarthritis.  Imaging shows bone on bone joint space narrowing with protrusio.  At this point, conservative treatments fail to provide any significant relief and the pain is severely affecting ADLs and quality of life.  Based on treatment options, the patient has elected to move forward with a hip replacement.  We have discussed the surgical risks that include but are not limited to infection, DVT, leg length discrepancy, numbness, tingling, incomplete relief of pain.  Recovery and prognosis were also reviewed.    Current anticoagulants: No antithrombotic Postop anticoagulation: Xarelto x  4 weeks due to h/o colon cancer Diabetic: No  Prior DVT/PE: No Tobacco use: No Clearances needed for surgery: Nanetta Batty cardiology, Hillery Aldo - PCP Anticipate discharge dispo: home with Garcia   Follow-Up Instructions: No follow-ups on file.   Orders:  Orders Placed This Encounter  Procedures   XR HIP UNILAT W OR W/O PELVIS 2-3 VIEWS RIGHT   XR HIP UNILAT W OR W/O PELVIS 2-3 VIEWS LEFT   No orders of the defined types were placed in this encounter.     Procedures: No procedures performed   Clinical Data: No additional findings.   Subjective: Chief Complaint  Patient presents with   Right Hip - Pain   Left Hip - Pain    HPI  [name] is a very pleasant 72 year old gentleman here for severe bilateral hip pain worse on the right.  Uses a cane for ambulation.  Has had pain for a long time but was not  psychologically ready for hip replacement surgery.  He is now experiencing severe pain and lack of quality of life as a result of the hips.  Review of Systems  Constitutional: Negative.   HENT: Negative.    Eyes: Negative.   Respiratory: Negative.    Cardiovascular: Negative.   Gastrointestinal: Negative.   Endocrine: Negative.   Genitourinary: Negative.   Skin: Negative.   Allergic/Immunologic: Negative.   Neurological: Negative.   Hematological: Negative.   Psychiatric/Behavioral: Negative.    All other systems reviewed and are negative.    Objective: Vital Signs: Ht  (1.803 m)   Wt 158 lb (71.7 kg)   BMI 22.04 kg/m   Physical Exam Vitals and nursing note reviewed.  Constitutional:      Appearance: He is well-developed.  HENT:     Head: Normocephalic and atraumatic.  Eyes:     Pupils: Pupils are equal, round, and reactive to light.  Pulmonary:     Effort: Pulmonary effort is normal.  Abdominal:     Palpations: Abdomen is soft.  Musculoskeletal:        General: Normal range of motion.     Cervical back: Neck supple.  Skin:    General: Skin is warm.  Neurological:     Mental Status: He is alert and oriented to person, place, and time.  Psychiatric:  Behavior: Behavior normal.        Thought Content: Thought content normal.        Judgment: Judgment normal.     Ortho Exam  Examination of bilateral hips show basically no internal or external rotation.  He can flex up to about 90 degrees with severe pain.  Antalgic gait.  Specialty Comments:  No specialty comments available.  Imaging: XR HIP UNILAT W OR W/O PELVIS 2-3 VIEWS RIGHT  Result Date: 11/13/2022 Advanced degenerative joint disease with bone-on-bone joint space narrowing.  There is protrusio deformity.  XR HIP UNILAT W OR W/O PELVIS 2-3 VIEWS LEFT  Result Date: 11/13/2022 Advanced degenerative joint disease with bone-on-bone joint space narrowing.  Mild protrusio    PMFS  History: Patient Active Problem List   Diagnosis Date Noted   Hyperlipidemia 08/05/2022   Genetic testing 09/07/2020   Acute-on-chronic kidney injury 02/12/2020   Sepsis 02/12/2020   Pneumonia 02/12/2020   Multifocal pneumonia 02/11/2020   Port-A-Cath in place 08/04/2019   Chronic systolic heart failure    Colonic obstruction    Cancer of transverse colon s/p partial colectomy 06/30/2019    Nausea & vomiting 06/26/2019   Colonic mass 06/26/2019   COPD GOLD ?  11/24/2018   Chest pain    NSTEMI (non-ST elevated myocardial infarction) 10/03/2018   COPD with acute exacerbation 07/26/2018   ARF (acute renal failure) 07/26/2018   Nausea and vomiting 07/26/2018   Weight loss, unintentional 07/26/2018   Malnutrition of moderate degree 07/16/2017   CKD (chronic kidney disease), stage II 07/14/2017   Solitary kidney, congenital 06/23/2017   Nonischemic cardiomyopathy    Multifocal PVCs    Chronic combined systolic (congestive) and diastolic (congestive) heart failure 06/11/2017   Hypokalemia 05/05/2017   DOE (dyspnea on exertion) 07/16/2013   Alcohol abuse 09/25/2012   Alcohol dependence (HCC) 09/22/2012   Essential hypertension 03/22/2007   DEGENERATIVE DISC DISEASE 03/22/2007   AVASCULAR NECROSIS 03/22/2007   Past Medical History:  Diagnosis Date   Alcoholism    Allergy    Arthritis    hips, L shoulder   Asthma    Cancer 05/2019   colon cancer   CHF (congestive heart failure)    Chronic kidney disease    only has one kidney   COPD (chronic obstructive pulmonary disease)    COVID-19    Depression    pt denies   Dyspnea    Dysrhythmia    PVC's   Family history of anesthesia complication    GERD (gastroesophageal reflux disease)    Heart murmur    Hyperlipidemia    Hypertension    Myocardial infarction    pt is unsure of date- believes early 2020   Osteoporosis    Pneumonia    Substance abuse    alcoholism over 10 yrs ago    Family History  Problem Relation Age  of Onset   Heart disease Father    Heart disease Daughter        "fluid around heart"    Canavan disease Daughter    Cancer Daughter        unsure of type   Asthma Son    Heart failure Mother    Kidney failure Mother    Colon cancer Neg Hx    Esophageal cancer Neg Hx    Rectal cancer Neg Hx    Stomach cancer Neg Hx     Past Surgical History:  Procedure Laterality Date   BIOPSY  06/27/2019  Procedure: BIOPSY;  Surgeon: Benancio Deeds, MD;  Location: [facility] ENDOSCOPY;  Service: Gastroenterology;;   CATARACT EXTRACTION     COLONOSCOPY Left 06/27/2019   Procedure: COLONOSCOPY;  Surgeon: Benancio Deeds, MD;  Location: [facility] ENDOSCOPY;  Service: Gastroenterology;  Laterality: Left;   COLONOSCOPY     LAPAROTOMY N/A 06/30/2019   Procedure: EXPLORATORY LAPAROTOMY;  Surgeon: Axel Filler, MD;  Location: [facility] OR;  Service: General;  Laterality: N/A;   LEFT HEART CATH AND CORONARY ANGIOGRAPHY N/A 10/04/2018   Procedure: LEFT HEART CATH AND CORONARY ANGIOGRAPHY;  Surgeon: Corky Crafts, MD;  Location: [facility] INVASIVE CV LAB;  Service: Cardiovascular;  Laterality: N/A;   PARTIAL COLECTOMY N/A 06/30/2019   Procedure: PARTIAL COLECTOMY;  Surgeon: Axel Filler, MD;  Location: [facility] OR;  Service: General;  Laterality: N/A;   PORTACATH PLACEMENT Left 08/02/2019   Procedure: INSERTION PORT-A-CATH LEFT CHEST;  Surgeon: Axel Filler, MD;  Location: [facility] OR;  Service: General;  Laterality: Left;   RIGHT/LEFT HEART CATH AND CORONARY ANGIOGRAPHY N/A 06/12/2017   Procedure: RIGHT/LEFT HEART CATH AND CORONARY ANGIOGRAPHY;  Surgeon: Dolores Patty, MD;  [facility] INVASIVE CV LAB;  Service: Cardiovascular;  Laterality: N/A;   Social History   Occupational History   Occupation: "it don't work for me", on disability  Tobacco Use   Smoking status: Former    Packs/day: 0.10    Years: 45.00    Additional pack years: 0.00    Total pack years: 4.50    Types: Cigarettes    Quit date: 2012     Years since quitting: 12.3   Smokeless tobacco: Never  Vaping Use   Vaping Use: Never used  Substance and Sexual Activity   Alcohol use: No    Comment: Pt states no etoh since April 2014   Drug use: Not Currently    Comment: Prior use of crack cocaine, quit 2012   Sexual activity: Never

## 2022-11-21 ENCOUNTER — Telehealth: Payer: Self-pay | Admitting: *Deleted

## 2022-11-21 NOTE — Telephone Encounter (Signed)
   Name: Joe Garcia  DOB: [birth year]  MRN: [id number]  Primary Cardiologist: Nanetta Batty, MD   Preoperative team, please contact this patient and set up a phone call appointment for further preoperative risk assessment. Please obtain consent and complete medication review. Thank you for your help.  I confirm that guidance regarding antiplatelet and oral anticoagulation therapy has been completed and, if necessary, noted below (none requested).    Joylene Grapes, NP 11/21/2022, 1:22 PM [facility]

## 2022-11-21 NOTE — Telephone Encounter (Signed)
No vm set up will try later.

## 2022-11-21 NOTE — Telephone Encounter (Addendum)
   Pre-operative Risk Assessment    Patient Name: Joe Garcia  DOB: [birth year] MRN: [id number]      Request for Surgical Clearance    Procedure:   right total hip  Date of Surgery:  Clearance TBD                                 Surgeon:  Dr Cheral Almas Surgeon's Group or Practice Name:   [facility] number:   [phone] Fax number:  [phone]  ATTN Debbie   Type of Clearance Requested:   - Medical    Type of Anesthesia:  Spinal   Additional requests/questions:  n/a  Cala Bradford   11/21/2022, 9:23 AM

## 2022-11-24 ENCOUNTER — Telehealth: Payer: Self-pay

## 2022-11-24 NOTE — Telephone Encounter (Signed)
Patient agreeable with telehealth appointment.   Med list and consent given.

## 2022-11-24 NOTE — Telephone Encounter (Signed)
  Patient Consent for Virtual Visit         Joe Garcia has provided verbal consent on 11/24/2022 for a virtual visit (video or telephone).   CONSENT FOR VIRTUAL VISIT FOR:  Joe Garcia  By participating in this virtual visit I agree to the following:  I hereby voluntarily request, consent and authorize [facility] and its employed or contracted physicians, physician assistants, nurse practitioners or other licensed health care professionals (the Practitioner), to provide me with telemedicine health care services (the "Services") as deemed necessary by the treating Practitioner. I acknowledge and consent to receive the Services by the Practitioner via telemedicine. I understand that the telemedicine visit will involve communicating with the Practitioner through live audiovisual communication technology and the disclosure of certain medical information by electronic transmission. I acknowledge that I have been given the opportunity to request an in-person assessment or other available alternative prior to the telemedicine visit and am voluntarily participating in the telemedicine visit.  I understand that I have the right to withhold or withdraw my consent to the use of telemedicine in the course of my care at any time, without affecting my right to future care or treatment, and that the Practitioner or I may terminate the telemedicine visit at any time. I understand that I have the right to inspect all information obtained and/or recorded in the course of the telemedicine visit and may receive copies of available information for a reasonable fee.  I understand that some of the potential risks of receiving the Services via telemedicine include:  Delay or interruption in medical evaluation due to technological equipment failure or disruption; Information transmitted may not be sufficient (e.g. poor resolution of images) to allow for appropriate medical decision making by the  Practitioner; and/or  In rare instances, security protocols could fail, causing a breach of personal health information.  Furthermore, I acknowledge that it is my responsibility to provide information about my medical history, conditions and care that is complete and accurate to the best of my ability. I acknowledge that Practitioner's advice, recommendations, and/or decision may be based on factors not within their control, such as incomplete or inaccurate data provided by me or distortions of diagnostic images or specimens that may result from electronic transmissions. I understand that the practice of medicine is not an exact science and that Practitioner makes no warranties or guarantees regarding treatment outcomes. I acknowledge that a copy of this consent can be made available to me via my patient portal [facility] MyChart), or I can request a printed copy by calling the office of [facility].    I understand that my insurance will be billed for this visit.   I have read or had this consent read to me. I understand the contents of this consent, which adequately explains the benefits and risks of the Services being provided via telemedicine.  I have been provided ample opportunity to ask questions regarding this consent and the Services and have had my questions answered to my satisfaction. I give my informed consent for the services to be provided through the use of telemedicine in my medical care

## 2022-11-26 NOTE — Progress Notes (Signed)
Virtual Visit via Telephone Note   Because of ALLANMICHAEL PREISER co-morbid illnesses, he is at least at moderate risk for complications without adequate follow up.  This format is felt to be most appropriate for this patient at this time.  The patient did not have access to video technology/had technical difficulties with video requiring transitioning to audio format only (telephone).  All issues noted in this document were discussed and addressed.  No physical exam could be performed with this format.  Please refer to the patient's chart for his consent to telehealth for [facility].  Evaluation Performed:  Preoperative cardiovascular risk assessment _____________   Date:  11/26/2022   Patient ID:  Joe Garcia, DOB [birth year], MRN [id number] Patient Location:  Home Provider location:   Office  Primary Care Provider:  Hillery Aldo, NP Primary Cardiologist:  Nanetta Batty, MD  Chief Complaint / Patient Profile   72 y.o. y/o male with a h/o HTN, chronic combined systolic and diastolic CHF, HLD who is pending Right total hip arthroplasty and presents today for telephonic preoperative cardiovascular risk assessment.  History of Present Illness    Joe Garcia is a 72 y.o. male who presents via audio/video conferencing for a telehealth visit today.  Pt was last seen in cardiology clinic on 08/05/2022 by Dr.Berry.  At that time Joe Garcia was doing well .  The patient is now pending procedure as outlined above. Since his last visit, he remains stable from a cardiac standpoint.  Today he denies chest pain, shortness of breath, lower extremity edema, fatigue, palpitations, melena, hematuria, hemoptysis, diaphoresis, weakness, presyncope, syncope, orthopnea, and PND.   Past Medical History    Past Medical History:  Diagnosis Date   Alcoholism (HCC)    Allergy    Arthritis    hips, L shoulder   Asthma    Cancer (HCC) 05/2019   colon cancer   CHF (congestive  heart failure) (HCC)    Chronic kidney disease    only has one kidney   COPD (chronic obstructive pulmonary disease) (HCC)    COVID-19    Depression    pt denies   Dyspnea    Dysrhythmia    PVC's   Family history of anesthesia complication    GERD (gastroesophageal reflux disease)    Heart murmur    Hyperlipidemia    Hypertension    Myocardial infarction [facility])    pt is unsure of date- believes early 2020   Osteoporosis    Pneumonia    Substance abuse (HCC)    alcoholism over 10 yrs ago   Past Surgical History:  Procedure Laterality Date   BIOPSY  06/27/2019   Procedure: BIOPSY;  Surgeon: Benancio Deeds, MD;  Location: [facility] ENDOSCOPY;  Service: Gastroenterology;;   CATARACT EXTRACTION     COLONOSCOPY Left 06/27/2019   Procedure: COLONOSCOPY;  Surgeon: Benancio Deeds, MD;  Location: [facility] ENDOSCOPY;  Service: Gastroenterology;  Laterality: Left;   COLONOSCOPY     LAPAROTOMY N/A 06/30/2019   Procedure: EXPLORATORY LAPAROTOMY;  Surgeon: Axel Filler, MD;  Location: [facility] OR;  Service: General;  Laterality: N/A;   LEFT HEART CATH AND CORONARY ANGIOGRAPHY N/A 10/04/2018   Procedure: LEFT HEART CATH AND CORONARY ANGIOGRAPHY;  Surgeon: Corky Crafts, MD;  Location: [facility] INVASIVE CV LAB;  Service: Cardiovascular;  Laterality: N/A;   PARTIAL COLECTOMY N/A 06/30/2019   Procedure: PARTIAL COLECTOMY;  Surgeon: Axel Filler, MD;  Location: [facility] OR;  Service: General;  Laterality:  N/A;   PORTACATH PLACEMENT Left 08/02/2019   Procedure: INSERTION PORT-A-CATH LEFT CHEST;  Surgeon: Axel Filler, MD;  Location: [facility] OR;  Service: General;  Laterality: Left;   RIGHT/LEFT HEART CATH AND CORONARY ANGIOGRAPHY N/A 06/12/2017   Procedure: RIGHT/LEFT HEART CATH AND CORONARY ANGIOGRAPHY;  Surgeon: Dolores Patty, MD;  Location: [facility] INVASIVE CV LAB;  Service: Cardiovascular;  Laterality: N/A;    Allergies  Allergies  Allergen Reactions   Clarithromycin Shortness Of Breath    Lisinopril Swelling    Facial and tongue swelling 10/2012    Home Medications    Prior to Admission medications   Medication Sig Start Date End Date Taking? Authorizing Provider  albuterol (VENTOLIN HFA) 108 (90 Base) MCG/ACT inhaler Inhale 2 puffs into the lungs every 4 (four) hours as needed for wheezing or shortness of breath. 07/09/20   Rodriguez-Southworth, Nettie Elm, PA-C  amiodarone (PACERONE) 200 MG tablet TAKE 1/2 TABLET (100 MG TOTAL) BY MOUTH DAILY(AM) 08/01/22   Runell Gess, MD  amLODipine (NORVASC) 5 MG tablet TAKE 1 TABLET (5 MG TOTAL) BY MOUTH DAILY (AM) 10/28/22   Runell Gess, MD  atorvastatin (LIPITOR) 10 MG tablet Take 10 mg by mouth at bedtime.    [provider]  budesonide-formoterol (SYMBICORT) 160-4.5 MCG/ACT inhaler Inhale 2 puffs into the lungs 2 (two) times daily. 11/23/18   Nyoka Cowden, MD  carvedilol (COREG) 3.125 MG tablet TAKE 1 TABLET (3.125 MG TOTAL) BY MOUTH 2 (TWO) TIMES DAILY WITH A MEAL. (AM+BEDTIME) 10/28/22   Runell Gess, MD  fluticasone [facility]) 50 MCG/ACT nasal spray Place 2 sprays into both nostrils daily as needed for allergies or rhinitis. 02/13/20   Glade Lloyd, MD  furosemide (LASIX) 20 MG tablet TAKE 1 TABLET (20 MG TOTAL) BY MOUTH DAILY (AM) 10/28/22   Runell Gess, MD  ipratropium-albuterol (DUONEB) 0.5-2.5 (3) MG/3ML SOLN Take 3 mLs by nebulization every 4 (four) hours as needed. 08/01/22   Crain, Whitney L, PA  isosorbide-hydrALAZINE (BIDIL) 20-37.5 MG tablet TAKE 1 TABLET BY MOUTH 3 (THREE) TIMES DAILY (AM+NOON+BEDTIME) 10/28/22   Runell Gess, MD  losartan (COZAAR) 50 MG tablet TAKE 1 TABLET (50 MG TOTAL) BY MOUTH DAILY (AM) 09/29/22   Runell Gess, MD  montelukast (SINGULAIR) 10 MG tablet Take 10 mg by mouth at bedtime.    [provider]  omeprazole (PRILOSEC) 40 MG capsule Take 1 capsule (40 mg total) by mouth daily. 12/23/21   Cathren Laine, MD  TRELEGY ELLIPTA 100-62.5-25 MCG/ACT AEPB Inhale 1 puff  into the lungs daily. 09/11/22   [provider]    Physical Exam    Vital Signs:  Joe Garcia does not have vital signs available for review today.  Given telephonic nature of communication, physical exam is limited. AAOx3. NAD. Normal affect.  Speech and respirations are unlabored.  Accessory Clinical Findings    None  Assessment & Plan    1.  Preoperative Cardiovascular Risk Assessment: Right total hip arthroplasty, Dr.Xu, Ortho care at [place], [phone]      Primary Cardiologist: Nanetta Batty, MD  Chart reviewed as part of pre-operative protocol coverage. Given past medical history and time since last visit, based on ACC/AHA guidelines, Joe Garcia would be at acceptable risk for the planned procedure without further cardiovascular testing.   His RCRI is a class II risk, 0.9% risk of major cardiac event.  He is able to complete greater than 4 METS of physical activity.  Patient was advised  that if he develops new symptoms prior to surgery to contact our office to arrange a follow-up appointment.  He verbalized understanding.  I will route this recommendation to the requesting party via Epic fax function and remove from pre-op pool.  Please call with questions.       Time:   Today, I have spent  5  minutes with the patient with telehealth technology discussing medical history, symptoms, and management plan.  Prior to his phone evaluation I spent greater than 10 minutes reviewing his past medical history and cardiac medications.   Ronney Asters, NP  11/26/2022, 2:21 PM

## 2022-11-28 ENCOUNTER — Ambulatory Visit: Payer: 59 | Attending: Cardiovascular Disease

## 2022-11-28 DIAGNOSIS — Z01810 Encounter for preprocedural cardiovascular examination: Secondary | ICD-10-CM

## 2023-01-14 ENCOUNTER — Telehealth: Payer: Self-pay | Admitting: *Deleted

## 2023-01-14 NOTE — Telephone Encounter (Signed)
Called Mr. Barrentine with the lab/CT scan appointment on 03/16/23. Mailed August appointment calendar to his home after confirming his address.

## 2023-01-26 DEATH — deceased

## 2023-03-16 ENCOUNTER — Inpatient Hospital Stay: Payer: 59 | Attending: Oncology

## 2023-03-16 ENCOUNTER — Ambulatory Visit (HOSPITAL_BASED_OUTPATIENT_CLINIC_OR_DEPARTMENT_OTHER)
Admission: RE | Admit: 2023-03-16 | Discharge: 2023-03-16 | Disposition: A | Discharge status: Home / Self Care | Payer: 59 | Source: Ambulatory Visit | Attending: Nurse Practitioner | Admitting: Nurse Practitioner

## 2023-03-16 DIAGNOSIS — E042 Nontoxic multinodular goiter: Secondary | ICD-10-CM | POA: Insufficient documentation

## 2023-03-16 DIAGNOSIS — G8929 Other chronic pain: Secondary | ICD-10-CM | POA: Insufficient documentation

## 2023-03-16 DIAGNOSIS — I3139 Other pericardial effusion (noninflammatory): Secondary | ICD-10-CM | POA: Insufficient documentation

## 2023-03-16 DIAGNOSIS — Z79899 Other long term (current) drug therapy: Secondary | ICD-10-CM | POA: Insufficient documentation

## 2023-03-16 DIAGNOSIS — J45909 Unspecified asthma, uncomplicated: Secondary | ICD-10-CM | POA: Insufficient documentation

## 2023-03-16 DIAGNOSIS — C184 Malignant neoplasm of transverse colon: Secondary | ICD-10-CM | POA: Insufficient documentation

## 2023-03-16 DIAGNOSIS — M25551 Pain in right hip: Secondary | ICD-10-CM | POA: Insufficient documentation

## 2023-03-16 DIAGNOSIS — Z8679 Personal history of other diseases of the circulatory system: Secondary | ICD-10-CM | POA: Insufficient documentation

## 2023-03-16 DIAGNOSIS — K069 Disorder of gingiva and edentulous alveolar ridge, unspecified: Secondary | ICD-10-CM | POA: Insufficient documentation

## 2023-03-16 DIAGNOSIS — Z9221 Personal history of antineoplastic chemotherapy: Secondary | ICD-10-CM | POA: Insufficient documentation

## 2023-03-16 DIAGNOSIS — I5022 Chronic systolic (congestive) heart failure: Secondary | ICD-10-CM | POA: Insufficient documentation

## 2023-03-16 DIAGNOSIS — I700 Atherosclerosis of aorta: Secondary | ICD-10-CM | POA: Insufficient documentation

## 2023-03-16 LAB — CEA (ACCESS): CEA (CHCC): 1.36 ng/mL (ref 0.00–5.00)

## 2023-03-19 ENCOUNTER — Inpatient Hospital Stay (HOSPITAL_BASED_OUTPATIENT_CLINIC_OR_DEPARTMENT_OTHER): Payer: 59 | Admitting: Oncology

## 2023-03-19 ENCOUNTER — Other Ambulatory Visit: Payer: Self-pay | Admitting: *Deleted

## 2023-03-19 VITALS — BP 145/74 | HR 60 | Temp 97.8°F | Resp 18 | Ht 71.0 in | Wt 158.0 lb

## 2023-03-19 DIAGNOSIS — Z8679 Personal history of other diseases of the circulatory system: Secondary | ICD-10-CM | POA: Diagnosis not present

## 2023-03-19 DIAGNOSIS — I700 Atherosclerosis of aorta: Secondary | ICD-10-CM | POA: Diagnosis not present

## 2023-03-19 DIAGNOSIS — C184 Malignant neoplasm of transverse colon: Secondary | ICD-10-CM | POA: Diagnosis not present

## 2023-03-19 DIAGNOSIS — E042 Nontoxic multinodular goiter: Secondary | ICD-10-CM | POA: Diagnosis not present

## 2023-03-19 DIAGNOSIS — K069 Disorder of gingiva and edentulous alveolar ridge, unspecified: Secondary | ICD-10-CM | POA: Diagnosis not present

## 2023-03-19 DIAGNOSIS — I3139 Other pericardial effusion (noninflammatory): Secondary | ICD-10-CM | POA: Diagnosis not present

## 2023-03-19 DIAGNOSIS — M25551 Pain in right hip: Secondary | ICD-10-CM | POA: Diagnosis not present

## 2023-03-19 DIAGNOSIS — G8929 Other chronic pain: Secondary | ICD-10-CM | POA: Diagnosis not present

## 2023-03-19 DIAGNOSIS — I5022 Chronic systolic (congestive) heart failure: Secondary | ICD-10-CM | POA: Diagnosis not present

## 2023-03-19 DIAGNOSIS — Z9221 Personal history of antineoplastic chemotherapy: Secondary | ICD-10-CM | POA: Diagnosis not present

## 2023-03-19 DIAGNOSIS — J45909 Unspecified asthma, uncomplicated: Secondary | ICD-10-CM | POA: Diagnosis not present

## 2023-03-19 DIAGNOSIS — Z79899 Other long term (current) drug therapy: Secondary | ICD-10-CM | POA: Diagnosis not present

## 2023-03-19 NOTE — Progress Notes (Signed)
[facility] OFFICE PROGRESS NOTE   Diagnosis: Colon cancer  INTERVAL HISTORY:   Mr. Mcclymont returns as scheduled.  He generally feels well.  He has chronic pain in the right hip.  He is planning to schedule hip replacement surgery with orthopedics.  No difficulty with bowel function.  No rectal bleeding.  He has intermittent dyspnea.  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  Objective:  Vital signs in last 24 hours:  Blood pressure (!) 145/74, pulse 60, temperature 97.8 F (36.6 C), temperature source Oral, resp. rate 18, height 5\' 11"  (1.803 m), weight 158 lb (71.7 kg), SpO2 100%.     Lymphatics: No cervical, supraclavicular, axillary, or inguinal nodes Resp: Good air movement bilaterally, mild expiratory wheeze Cardio: Regular rate and rhythm GI: Nontender, no mass, no hepatosplenomegaly Vascular: No leg edema   Lab Results:  Lab Results  Component Value Date   WBC 4.8 08/18/2022   HGB 14.9 08/18/2022   HCT 43.9 08/18/2022   MCV 85.7 08/18/2022   PLT 221 08/18/2022   NEUTROABS 2.6 08/18/2022    CMP  Lab Results  Component Value Date   NA 138 08/18/2022   K 4.2 08/18/2022   CL 104 08/18/2022   CO2 20 (L) 08/18/2022   GLUCOSE 66 (L) 08/18/2022   BUN 18 08/18/2022   CREATININE 1.54 (H) 08/18/2022   CALCIUM 9.4 08/18/2022   PROT 7.4 08/18/2022   ALBUMIN 3.8 08/18/2022   AST 16 08/18/2022   ALT 12 08/18/2022   ALKPHOS 91 08/18/2022   BILITOT 0.5 08/18/2022   GFRNONAA 49 (L) 08/18/2022   GFRAA >60 02/13/2020    Lab Results  Component Value Date   CEA1 1.30 01/22/2021   CEA 1.36 03/16/2023    Lab Results  Component Value Date   INR 0.9 06/26/2019   LABPROT 12.3 06/26/2019    Imaging:  CT Chest Wo Contrast  Result Date: 03/18/2023 CLINICAL DATA:  Colon cancer staging; * Tracking Code: BO * EXAM: CT CHEST WITHOUT CONTRAST TECHNIQUE: Multidetector CT imaging of the chest was performed following the standard protocol without IV  contrast. RADIATION DOSE REDUCTION: This exam was performed according to the departmental dose-optimization program which includes automated exposure control, adjustment of the mA and/or kV according to patient size and/or use of iterative reconstruction technique. COMPARISON:  Chest CT dated September 13, 2022 FINDINGS: Cardiovascular: Normal heart size. Trace pericardial effusion. Normal caliber thoracic aorta with mild calcified plaque. No coronary artery calcifications. Mediastinum/Nodes: Esophagus and thyroid are unremarkable. No enlarged lymph nodes seen in the chest. Lungs/Pleura: Central airways are patent. Stable biapical pleural-parenchymal scarring. Interval resolution of centrilobular ground-glass nodules. Subtle reticular and ground-glass opacities of the left-greater-than-right upper lobes. Solid right lower lobe pulmonary nodule which was new on prior exam has resolved. No new or enlarging pulmonary nodules. No pleural effusion. Upper Abdomen: Postsurgical changes of the stomach. No acute abnormality. Musculoskeletal: No chest wall mass or suspicious bone lesions identified. IMPRESSION: 1. No evidence of metastatic disease in the chest. 2. Interval resolution of centrilobular ground-glass nodules. Subtle reticular and ground-glass opacities of the left-greater-than-right upper lobes, likely scarring. 3. Solid right lower lobe pulmonary nodule which was new on prior exam has resolved. 4. Aortic Atherosclerosis (ICD10-I70.0). Electronically Signed   By: Allegra Lai M.D.   On: 03/18/2023 17:53    Medications: I have reviewed the patient's current medications.   Assessment/Plan:  Colon cancer, transverse colon, stage IIIc, T4bN2, status post a partial transverse colectomy 06/30/2019 Grade  3, lymphovascular and perineural invasion present, 4/18 lymph nodes positive, positive lymph nodes mesenteric margin, tumor invades into adherent omentum, MSI high, loss of MLH1 and PMS2 expression; MLH1  methylation not detected; BRAF mutation analysis-negative CT abdomen/pelvis 06/26/2019-thickening within the distal transverse colon, severely atrophic and calcified right kidney CT chest 06/28/2019-no evidence of metastatic disease, stable 3 mm right lower lobe nodule Cycle 1 FOLFOX 08/04/2019 (oxaliplatin 65 mg/m due to renal function) Plan for repeat CTs after he has completed 5 cycles of chemotherapy Cycle 2 FOLFOX 08/18/2019 Cycle 3 FOLFOX 08/31/2019, Udenyca added Cycle 4 FOLFOX 10/12/2019 Cycle 5 FOLFOX 10/25/2019 Stable right lower lobe nodule on chest CT 09/21/2019 CT abdomen/pelvis 11/07/2019-postoperative changes related to colectomy and anastomosis.  No current evidence of disease.  No adenopathy. Cycle 6 FOLFOX 11/08/2019 Cycle 7 FOLFOX 11/23/2019 Cycle 8 FOLFOX 12/06/2019 Cycle 9 FOLFOX 12/20/2019 Case presented GI tumor conference 12/28/2018-tumor margin negative, positive lymph node at margin; no recommendation for further surgery or radiation; peripancreatic node on baseline CT not seen on April 2021 CT Cycle 10 FOLFOX 01/03/2020  Cycle 11 FOLFOX 01/17/2020-oxaliplatin and Udenyca held Cycle 12 FOLFOX 01/31/2020-oxaliplatin and Udenyca held, 5-FU dose reduced secondary to mucositis 05/25/2020 colonoscopy-internal hemorrhoids, repeat colonoscopy in 3 years for surveillance CTs 06/11/2020-new trace right pelvic fluid, nonspecific; new mild left upper lobe interstitial/groundglass opacity suspicious for minimal infection or inflammation; similar 2 mm right lower lobe pulmonary nodule CTs 05/23/2021-no evidence of recurrent colon cancer CTs 05/30/2022-nonspecific groundglass opacities in the bilateral upper lobes, new 4 mm groundglass right upper lobe nodule, no evidence of metastatic disease CT chest 09/13/2022-markedly increased upper and mid lung zone predominant ill-defined centrilobular nodularity.  New 3 mm lateral right lower lobe nodule.  Previously questioned 4 mm groundglass right upper lobe  nodule not appreciated. CT chest 03/16/2023-no evidence of metastatic disease, interval resolution of centrilobular groundglass nodules, solid right lower lobe pulmonary nodule which was new on 09/13/2022 has resolved Asthma Chronic systolic heart failure History of PVCs Atrophic right kidney Microcytic anemia, likely iron deficiency anemia secondary to #1 Port-A-Cath placement on 12/15/2019, Dr. 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 Kubick is in clinical remission from colon cancer.  He will be scheduled for a surveillance CT abdomen/pelvis and follow-up office visit in November.  We will refer him for a surveillance colonoscopy.  Thornton Papas, MD  03/19/2023  10:20 AM

## 2023-04-02 ENCOUNTER — Encounter: Payer: 59 | Admitting: *Deleted

## 2023-04-02 VITALS — BP 146/59 | HR 81 | Temp 98.4°F | Resp 18 | Ht 71.0 in | Wt 158.1 lb

## 2023-04-02 DIAGNOSIS — Z006 Encounter for examination for normal comparison and control in clinical research program: Secondary | ICD-10-CM

## 2023-04-02 NOTE — Progress Notes (Signed)
Patient is a very nice 73 year old male patient of Dr. Truett Perna and Allyson Sabal.  He has stage III colon cancer, but was told if his next scan is negative he will not need to be seen for a year.  Followed by Dr. Allyson Sabal for hypertension.   No chest pain.  Has known normal CA. Last EF 55-60%.  He was thought to have a Takotsubo MI in the past.    Alert, oriented male in NAD BP       P       R       T Alert,oriented male in NAD No JVD No carotid bruits Lungs clear Cor regular without murmur Abd soft without masses Ext  no edema.  Excellent pulses.    Impression  Hypertension Stage III colon cancer, stable appears in remission   Plan   Objectives of study discussed  Informed consent obtained  Patient enrolled in AA heart registry (there is no IP in this study) - control patient.   Arturo Morton. Riley Kill, MD [facility]

## 2023-04-02 NOTE — Research (Addendum)
AA HEART  Informed Consent   Subject Name: Joe Garcia  Subject met inclusion and exclusion criteria.  The informed consent form, study requirements and expectations were reviewed with the subject and questions and concerns were addressed prior to the signing of the consent form.  The subject verbalized understanding of the trial requirements.  The subject agreed to participate in the AA HEART  trial and signed the informed consent at 12:37 PM on 04-02-2023.  The informed consent was obtained prior to performance of any protocol-specific procedures for the subject.  A copy of the signed informed consent was given to the subject and a copy was placed in the subject's medical record.   [place] Haylyn Halberg, Research Coordinator    Are there any labs that are clinically significant?  Yes []  OR No[]   Please FORWARD back to me with any changes or follow up!     ACCESSION NO. [id number]                                             Page 1 of 1                                                        INVESTIGATOR: ([id number])                          PROTOCOL   [id number]                     Thomasene Ripple, M.D.                              INVESTIGATOR NO.: [id number]                     c/o [facility]                         SUBJECT NUMBER: [id number]                     [facility]                 SUBJECT INITIALS NOT COLLECTED:                     [address]                          VISIT: D0                     [place], [place]                   SPONSOR REPORT TO:                 COLLECTION TIME:13:12 DATE:02-Apr-2023                     Cruzita Lederer                 DATE RECEIVED  IN LABORATORY: 03-Apr-2023                     c/o Sponsor [employer] access         DATE REPORTED BY LABORATORY: 03-Apr-2023                     [place]                          SEX: M  AGE: 62A                     [id number]                   [place], [place]  [place] [phone]                                                                                        Ref. Ranges               Clinical    Comments                                                                          Significance                                                                            Yes*  No                    HEMATOLOGY&DIFFERENTIAL PANEL                      HGB            13.1         12.5-17.0 g/dL                      HCT            40           37-51 %                      RBC            4.4          4.0-5.8 x106/uL                      MCH            30           26-34 pg  MCHC           33           31-38 g/dL                      RDW            15.9    H    12.0-15.0 %                      RBC Morph      No Review Required                        MCV            91           80-100 fL                      WBC            4.35         3.80-10.70 x103/uL                      Neutrophil     2.30         1.96-7.23 x103/uL                      Lymphocyte     1.76         0.80-3.00 x103/uL                      Monocytes      0.19         0.12-0.92 x103/uL                      Eosinophil     0.09         0.00-0.57 x103/uL                      Basophils      0.01         0.00-0.20 x103/uL                      Neutrophil     52.9         40.5-75.0 %                      Lymphocyte     40.4         15.4-48.5%                      Monocytes      4.4          2.6-10.1 %                      Eosinophil     2.1          0.0-6.8 %                      Basophils      0.2          0.0-2.0 %                      Platelets      201  130-394 x103/uL  RETICULOCYTES                      Retic %        0.8          0.6-2.5 %                      Retic Abs      0.036        0.030-0.130 x106/uL  ANC                      ANC            2.30         1.96-7.23 x103/uL                    HBA1C                      Fast HbA1c     5.8           <6.5%  [id number] LPA COLLECTION D/T                      Untimed D      02-Apr-2023                        Untimed T      13:12                            SM/PLASMA PROTEO & BMS D/T                      Untimed D      02-Apr-2023                        Untimed T      13:12                            SM/WB DNA COLLECTION D/T                      Untimed D      02-Apr-2023                        Untimed T      13:12                            SM/WB PAXGENE RNA COLLECT D/T                      Untimed D      02-Apr-2023                        Untimed T      13:12                            SUBJECT FASTED AT LEAST 9 HRS?                      Pt fast 9h     No  Participant ID: [id number]  LP(a) Result: : 236.4 nmol/L  Date Resulted:  May 01, 2023  Date of Coaching: December 30,2024   [x]  (Please check once complete) Discussed Lp(a) result with participant:    What is Lipoprotein(a) Lp(a)?  What is a "normal" Lp(a) level, and when should I be concerned?  How is Lp(a) related to "bad cholesterol?"  Does a high Lp(a) level increase my risk of heart disease?  How are Lp(a) levels inherited?  Do Lp(a) levels vary by race or ethnicity?  Diagnosing High Lp(a)  What are the signs of high Lp(a)?  How to get an Lp(a) test?  What Lp(a) results are considered high? How do I get a diagnosis of elevated lipoprotein(a)?  How can I lower my Lp(a)?    During the return of results coaching session, all the topics listed above were reviewed with the participant as per the documents: Guidance for Clinician-Patient Lp(a) Risk Assessment Discussion African American Heart Study and Micro-Learning Session: High Lipoprotein(a) 101.  Participant verbalized understanding of test results and what to do next: to ask their healthcare practitioner to test Lp(a) level, advise first-degree relatives to be screened, and work to reduce all cardiovascular risk factors within their control, especially  LDL cholesterol.

## 2023-04-06 ENCOUNTER — Other Ambulatory Visit: Payer: Self-pay

## 2023-04-20 NOTE — Pre-Procedure Instructions (Signed)
Surgical Instructions   Your procedure is scheduled on Friday, October 4th. Report to [facility] Main Entrance "A" at 07:40 A.M., then check in with the Admitting office. Any questions or running late day of surgery: call [phone]  Questions prior to your surgery date: call [phone], Monday-Friday, 8am-4pm. If you experience any cold or flu symptoms such as cough, fever, chills, shortness of breath, etc. between now and your scheduled surgery, please notify us at the above number.     Remember:  Do not eat after midnight the night before your surgery  You may drink clear liquids until 07:10 AM the morning of your surgery.   Clear liquids allowed are: Water, Non-Citrus Juices (without pulp), Carbonated Beverages, Clear Tea, Black Coffee Only (NO MILK, CREAM OR POWDERED CREAMER of any kind), and Gatorade.  Patient Instructions  The night before surgery:  No food after midnight. ONLY clear liquids after midnight  The day of surgery (if you do NOT have diabetes):  Drink ONE (1) Pre-Surgery Clear Ensure by 07:10 AM the morning of surgery. Drink in one sitting. Do not sip.  This drink was given to you during your hospital  pre-op appointment visit.  Nothing else to drink after completing the  Pre-Surgery Clear Ensure.          If you have questions, please contact your surgeon's office.    Take these medicines the morning of surgery with A SIP OF WATER  amiodarone (PACERONE)  amLODipine (NORVASC)  budesonide-formoterol (SYMBICORT)  carvedilol (COREG)  isosorbide-hydrALAZINE (BIDIL)  TRELEGY ELLIPTA    May take these medicines IF NEEDED: albuterol (VENTOLIN HFA)- bring inhaler with you on day of surgery fluticasone (FLONASE)  ipratropium-albuterol (DUONEB)     One week prior to surgery, STOP taking any Aspirin (unless otherwise instructed by your surgeon) Aleve, Naproxen, Ibuprofen, Motrin, Advil, Goody's, BC's, all herbal medications, fish oil, and non-prescription  vitamins.                     Do NOT Smoke (Tobacco/Vaping) for 24 hours prior to your procedure.  If you use a CPAP at night, you may bring your mask/headgear for your overnight stay.   You will be asked to remove any contacts, glasses, piercing's, hearing aid's, dentures/partials prior to surgery. Please bring cases for these items if needed.    Patients discharged the day of surgery will not be allowed to drive home, and someone needs to stay with them for 24 hours.  SURGICAL WAITING ROOM VISITATION Patients may have no more than 2 support people in the waiting area - these visitors may rotate.   Pre-op nurse will coordinate an appropriate time for 1 ADULT support person, who may not rotate, to accompany patient in pre-op.  Children under the age of 51 must have an adult with them who is not the patient and must remain in the main waiting area with an adult.  If the patient needs to stay at the hospital during part of their recovery, the visitor guidelines for inpatient rooms apply.  Please refer to the [facility] website for the visitor guidelines for any additional information.   If you received a COVID test during your pre-op visit  it is requested that you wear a mask when out in public, stay away from anyone that may not be feeling well and notify your surgeon if you develop symptoms. If you have been in contact with anyone that has tested positive in the last 10 days please  notify you surgeon.      Pre-operative 5 CHG Bathing Instructions   You can play a key role in reducing the risk of infection after surgery. Your skin needs to be as free of germs as possible. You can reduce the number of germs on your skin by washing with CHG (chlorhexidine gluconate) soap before surgery. CHG is an antiseptic soap that kills germs and continues to kill germs even after washing.   DO NOT use if you have an allergy to chlorhexidine/CHG or antibacterial soaps. If your skin becomes reddened or  irritated, stop using the CHG and notify one of our RNs at [phone].   Please shower with the CHG soap starting 4 days before surgery using the following schedule:     Please keep in mind the following:  DO NOT shave, including legs and underarms, starting the day of your first shower.   You may shave your face at any point before/day of surgery.  Place clean sheets on your bed the day you start using CHG soap. Use a clean washcloth (not used since being washed) for each shower. DO NOT sleep with pets once you start using the CHG.   CHG Shower Instructions:  Wash your face and private area with normal soap. If you choose to wash your hair, wash first with your normal shampoo.  After you use shampoo/soap, rinse your hair and body thoroughly to remove shampoo/soap residue.  Turn the water OFF and apply about 3 tablespoons (45 ml) of CHG soap to a CLEAN washcloth.  Apply CHG soap ONLY FROM YOUR NECK DOWN TO YOUR TOES (washing for 3-5 minutes)  DO NOT use CHG soap on face, private areas, open wounds, or sores.  Pay special attention to the area where your surgery is being performed.  If you are having back surgery, having someone wash your back for you may be helpful. Wait 2 minutes after CHG soap is applied, then you may rinse off the CHG soap.  Pat dry with a clean towel  Put on clean clothes/pajamas   If you choose to wear lotion, please use ONLY the CHG-compatible lotions on the back of this paper.   Additional instructions for the day of surgery: DO NOT APPLY any lotions, deodorants, cologne, or perfumes.   Do not bring valuables to the hospital. [facility] is not responsible for any belongings/valuables. Do not wear nail polish, gel polish, artificial nails, or any other type of covering on natural nails (fingers and toes) Do not wear jewelry or makeup Put on clean/comfortable clothes.  Please brush your teeth.  Ask your nurse before applying any prescription medications to  the skin.     CHG Compatible Lotions   Aveeno Moisturizing lotion  Cetaphil Moisturizing Cream  Cetaphil Moisturizing Lotion  Clairol Herbal Essence Moisturizing Lotion, Dry Skin  Clairol Herbal Essence Moisturizing Lotion, Extra Dry Skin  Clairol Herbal Essence Moisturizing Lotion, Normal Skin  Curel Age Defying Therapeutic Moisturizing Lotion with Alpha Hydroxy  Curel Extreme Care Body Lotion  Curel Soothing Hands Moisturizing Hand Lotion  Curel Therapeutic Moisturizing Cream, Fragrance-Free  Curel Therapeutic Moisturizing Lotion, Fragrance-Free  Curel Therapeutic Moisturizing Lotion, Original Formula  Eucerin Daily Replenishing Lotion  Eucerin Dry Skin Therapy Plus Alpha Hydroxy Crme  Eucerin Dry Skin Therapy Plus Alpha Hydroxy Lotion  Eucerin Original Crme  Eucerin Original Lotion  Eucerin Plus Crme Eucerin Plus Lotion  Eucerin TriLipid Replenishing Lotion  Keri Anti-Bacterial Hand Lotion  Keri Deep Conditioning Original Lotion Dry Skin Formula  Softly Scented  Keri Deep Conditioning Original Lotion, Fragrance Free Sensitive Skin Formula  Keri Lotion Fast Absorbing Fragrance Free Sensitive Skin Formula  Keri Lotion Fast Absorbing Softly Scented Dry Skin Formula  Keri Original Lotion  Keri Skin Renewal Lotion Keri Silky Smooth Lotion  Keri Silky Smooth Sensitive Skin Lotion  Nivea Body Creamy Conditioning Oil  Nivea Body Extra Enriched Lotion  Nivea Body Original Lotion  Nivea Body Sheer Moisturizing Lotion Nivea Crme  Nivea Skin Firming Lotion  NutraDerm 30 Skin Lotion  NutraDerm Skin Lotion  NutraDerm Therapeutic Skin Cream  NutraDerm Therapeutic Skin Lotion  ProShield Protective Hand Cream  Provon moisturizing lotion  Please read over the following fact sheets that you were given.

## 2023-04-21 ENCOUNTER — Other Ambulatory Visit: Payer: Self-pay

## 2023-04-21 ENCOUNTER — Encounter (HOSPITAL_COMMUNITY): Payer: Self-pay

## 2023-04-21 ENCOUNTER — Encounter (HOSPITAL_COMMUNITY)
Admission: RE | Admit: 2023-04-21 | Discharge: 2023-04-21 | Disposition: A | Discharge status: Home / Self Care | Payer: 59 | Source: Ambulatory Visit | Attending: Orthopaedic Surgery | Admitting: Orthopaedic Surgery

## 2023-04-21 VITALS — BP 154/76 | HR 54 | Temp 98.3°F | Resp 18 | Ht 71.0 in | Wt 158.8 lb

## 2023-04-21 DIAGNOSIS — Z85038 Personal history of other malignant neoplasm of large intestine: Secondary | ICD-10-CM | POA: Diagnosis not present

## 2023-04-21 DIAGNOSIS — E785 Hyperlipidemia, unspecified: Secondary | ICD-10-CM | POA: Insufficient documentation

## 2023-04-21 DIAGNOSIS — Z9049 Acquired absence of other specified parts of digestive tract: Secondary | ICD-10-CM | POA: Insufficient documentation

## 2023-04-21 DIAGNOSIS — J4489 Other specified chronic obstructive pulmonary disease: Secondary | ICD-10-CM | POA: Insufficient documentation

## 2023-04-21 DIAGNOSIS — M1611 Unilateral primary osteoarthritis, right hip: Secondary | ICD-10-CM | POA: Diagnosis not present

## 2023-04-21 DIAGNOSIS — Z87448 Personal history of other diseases of urinary system: Secondary | ICD-10-CM | POA: Diagnosis not present

## 2023-04-21 DIAGNOSIS — I10 Essential (primary) hypertension: Secondary | ICD-10-CM | POA: Insufficient documentation

## 2023-04-21 DIAGNOSIS — Z87891 Personal history of nicotine dependence: Secondary | ICD-10-CM | POA: Insufficient documentation

## 2023-04-21 DIAGNOSIS — G620 Drug-induced polyneuropathy: Secondary | ICD-10-CM | POA: Insufficient documentation

## 2023-04-21 DIAGNOSIS — Z01812 Encounter for preprocedural laboratory examination: Secondary | ICD-10-CM | POA: Diagnosis present

## 2023-04-21 DIAGNOSIS — Z01818 Encounter for other preprocedural examination: Secondary | ICD-10-CM

## 2023-04-21 DIAGNOSIS — Z9221 Personal history of antineoplastic chemotherapy: Secondary | ICD-10-CM | POA: Diagnosis not present

## 2023-04-21 LAB — CBC
HCT: 43.1 % (ref 39.0–52.0)
Hemoglobin: 14.1 g/dL (ref 13.0–17.0)
MCH: 29.1 pg (ref 26.0–34.0)
MCHC: 32.7 g/dL (ref 30.0–36.0)
MCV: 88.9 fL (ref 80.0–100.0)
Platelets: 222 10*3/uL (ref 150–400)
RBC: 4.85 MIL/uL (ref 4.22–5.81)
RDW: 15.3 % (ref 11.5–15.5)
WBC: 5.4 10*3/uL (ref 4.0–10.5)
nRBC: 0 % (ref 0.0–0.2)

## 2023-04-21 LAB — BASIC METABOLIC PANEL
Anion gap: 8 (ref 5–15)
BUN: 17 mg/dL (ref 8–23)
CO2: 24 mmol/L (ref 22–32)
Calcium: 8.9 mg/dL (ref 8.9–10.3)
Chloride: 105 mmol/L (ref 98–111)
Creatinine, Ser: 1.31 mg/dL — ABNORMAL HIGH (ref 0.61–1.24)
GFR, Estimated: 59 mL/min — ABNORMAL LOW (ref >60)
Glucose, Bld: 91 mg/dL (ref 70–99)
Potassium: 3.9 mmol/L (ref 3.5–5.1)
Sodium: 137 mmol/L (ref 135–145)

## 2023-04-21 LAB — SURGICAL PCR SCREEN
MRSA, PCR: NEGATIVE
Staphylococcus aureus: NEGATIVE

## 2023-04-21 LAB — TYPE AND SCREEN
ABO/RH(D): A POS
Antibody Screen: NEGATIVE

## 2023-04-21 NOTE — Progress Notes (Signed)
PCP - Hillery Aldo, NP Cardiologist - Dr. Nanetta Batty Oncologist- Dr. Thornton Papas  PPM/ICD - denies    Chest x-ray - 08/18/22 EKG - 08/05/22 Stress Test - 20+ years ago per pt, in [place] per pt ECHO - 09/20/20 Cardiac Cath - 10/04/18  Sleep Study - denies   DM- denies  ASA/Blood Thinner Instructions: n/a   ERAS Protcol - yes PRE-SURGERY Ensure given at PAT  COVID TEST- n/a   Anesthesia review: yes, cardiac hx  Patient denies shortness of breath, fever, cough and chest pain at PAT appointment   All instructions explained to the patient, with a verbal understanding of the material. Patient agrees to go over the instructions while at home for a better understanding.  The opportunity to ask questions was provided.

## 2023-04-22 NOTE — Progress Notes (Addendum)
Anesthesia Chart Review:  Follows cardiology for history of HTN, HLD, prior history of systolic HF secondary to high PVC burden - EF normalized with suppression of PVCs.  Cath 09/2018 showed no significant CAD.  Echo 08/2020 shows EF 55 to 60%, grade 1 DD mild aortic regurgitation, very frequent PVCs.  Seen by Joe Fabian, NP on 11/28/2022 for preop evaluation.  Per note, "Chart reviewed as part of pre-operative protocol coverage. Given past medical history and time since last visit, based on ACC/AHA guidelines, Joe Garcia would be at acceptable risk for the planned procedure without further cardiovascular testing. His RCRI is a class II risk, 0.9% risk of major cardiac event.  He is able to complete greater than 4 METS of physical activity."  Follows with oncology for hx of colon cancer, transverse colon, stage IIIc, T4bN2, status post a partial transverse colectomy 06/2019 followed by chemotherapy. 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   History of atrophic R kidney and CKD.   Remote history of EtOH abuse.  Former smoker with history of asthma/COPD, maintained on Symbicort Singulair, and as needed albuterol.  Trelegy Ellipta is also listed as an as-needed medication.  Preop labs reviewed, creatinine mildly elevated 1.31, otherwise WNL.  EKG 08/05/2022: Sinus bradycardia.  Rate 56.  CT Chest 03/18/23: IMPRESSION: 1. No evidence of metastatic disease in the chest. 2. Interval resolution of centrilobular ground-glass nodules. Subtle reticular and ground-glass opacities of the left-greater-than-right upper lobes, likely scarring. 3. Solid right lower lobe pulmonary nodule which was new on prior exam has resolved. 4. Aortic Atherosclerosis (ICD10-I70.0).  TTE 09/20/2020: 1. LVEF 55-60%, very frequent PVCs during the acquisition.   2. Left ventricular ejection fraction, by estimation, is 55 to 60%. The  left ventricle has normal function. The left ventricle has no regional  wall motion  abnormalities. The left ventricular internal cavity size was  mildly dilated. Left ventricular  diastolic parameters are consistent with Grade I diastolic dysfunction  (impaired relaxation). Elevated left atrial pressure.   3. Right ventricular systolic function is normal. The right ventricular  size is normal. There is normal pulmonary artery systolic pressure.   4. The mitral valve is normal in structure. Mild mitral valve  regurgitation. No evidence of mitral stenosis.   5. The aortic valve is normal in structure. Aortic valve regurgitation is  mild. No aortic stenosis is present.   6. The inferior vena cava is normal in size with greater than 50%  respiratory variability, suggesting right atrial pressure of 3 mmHg.   Cath 10/04/2018: LV end diastolic pressure is low. There is no aortic valve stenosis. No significant CAD.   Medical therapy for nonischemic cardiomyopathy, LV dysfunction.    Joe Garcia, Joe Garcia Joe Garcia/Anesthesiology Phone [phone] 04/22/2023 1:57 PM

## 2023-04-22 NOTE — Anesthesia Preprocedure Evaluation (Addendum)
Anesthesia Evaluation  Patient identified by MRN, date of birth, ID band Patient awake    Reviewed: Allergy & Precautions, NPO status , Patient's Chart, lab work & pertinent test results  Airway Mallampati: I  TM Distance: >3 FB Neck ROM: Full    Dental  (+) Dental Advisory Given, Poor Dentition, Missing, Chipped,    Pulmonary asthma , COPD,  COPD inhaler, former smoker   breath sounds clear to auscultation       Cardiovascular hypertension, Pt. on medications and Pt. on home beta blockers + Past MI, +CHF and + DOE  + dysrhythmias + Valvular Problems/Murmurs  Rhythm:Regular Rate:Normal  Echo: 1. LVEF 55-60%, very frequent PVCs during the acquisition.   2. Left ventricular ejection fraction, by estimation, is 55 to 60%. The  left ventricle has normal function. The left ventricle has no regional  wall motion abnormalities. The left ventricular internal cavity size was  mildly dilated. Left ventricular  diastolic parameters are consistent with Grade I diastolic dysfunction  (impaired relaxation). Elevated left atrial pressure.   3. Right ventricular systolic function is normal. The right ventricular  size is normal. There is normal pulmonary artery systolic pressure.   4. The mitral valve is normal in structure. Mild mitral valve  regurgitation. No evidence of mitral stenosis.   5. The aortic valve is normal in structure. Aortic valve regurgitation is  mild. No aortic stenosis is present.   6. The inferior vena cava is normal in size with greater than 50%  respiratory variability, suggesting right atrial pressure of 3 mmHg.     Neuro/Psych  PSYCHIATRIC DISORDERS  Depression    negative neurological ROS     GI/Hepatic Neg liver ROS,GERD  ,,  Endo/Other  negative endocrine ROS    Renal/GU Renal disease     Musculoskeletal  (+) Arthritis ,    Abdominal   Peds  Hematology negative hematology ROS (+)   Anesthesia Other  Findings   Reproductive/Obstetrics                             Anesthesia Physical Anesthesia Plan  ASA: 3  Anesthesia Plan: Spinal   Post-op Pain Management: Tylenol PO (pre-op)*   Induction: Intravenous  PONV Risk Score and Plan: 2 and Ondansetron, Propofol infusion and Midazolam  Airway Management Planned: Natural Airway and Nasal Cannula  Additional Equipment: None  Intra-op Plan:   Post-operative Plan:   Informed Consent: I have reviewed the patients History and Physical, chart, labs and discussed the procedure including the risks, benefits and alternatives for the proposed anesthesia with the patient or authorized representative who has indicated his/her understanding and acceptance.       Plan Discussed with:   Anesthesia Plan Comments: (Lab Results      Component                Value               Date                      WBC                      5.4                 04/21/2023                HGB  14.1                04/21/2023                HCT                      43.1                04/21/2023                MCV                      88.9                04/21/2023                PLT                      222                 04/21/2023             PAT note by Antionette Poles, PA-C: Follows cardiology for history of HTN, HLD, prior history of systolic HF secondary to high PVC burden - EF normalized with suppression of PVCs.  Cath 09/2018 showed no significant CAD.  Echo 08/2020 shows EF 55 to 60%, grade 1 DD mild aortic regurgitation, very frequent PVCs.  Seen by Edd Fabian, NP on 11/28/2022 for preop evaluation.  Per note, "Chart reviewed as part of pre-operative protocol coverage. Given past medical history and time since last visit, based on ACC/AHA guidelines, STALIN GRUENBERG would be at acceptable risk for the planned procedure without further cardiovascular testing. His RCRI is a class II risk, 0.9% risk of major cardiac  event.  He is able to complete greater than 4 METS of physical activity."  Follows with oncology for hx of colon cancer, transverse colon,stage IIIc, T4bN2, status post a partial transverse colectomy 06/2019 followed by chemotherapy. 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   History of atrophic R kidney and CKD.   Remote history of EtOH abuse.  Former smoker with history of asthma/COPD, maintained on Symbicort Singulair, and as needed albuterol.  Trelegy Ellipta is also listed as an as-needed medication.  Preop labs reviewed, creatinine mildly elevated 1.31, otherwise WNL.  EKG 08/05/2022: Sinus bradycardia.  Rate 56.  CT Chest 03/18/23: IMPRESSION: 1. No evidence of metastatic disease in the chest. 2. Interval resolution of centrilobular ground-glass nodules. Subtle reticular and ground-glass opacities of the left-greater-than-right upper lobes, likely scarring. 3. Solid right lower lobe pulmonary nodule which was new on prior exam has resolved. 4. Aortic Atherosclerosis (ICD10-I70.0).  TTE 09/20/2020: 1. LVEF 55-60%, very frequent PVCs during the acquisition.  2. Left ventricular ejection fraction, by estimation, is 55 to 60%. The  left ventricle has normal function. The left ventricle has no regional  wall motion abnormalities. The left ventricular internal cavity size was  mildly dilated. Left ventricular  diastolic parameters are consistent with Grade I diastolic dysfunction  (impaired relaxation). Elevated left atrial pressure.  3. Right ventricular systolic function is normal. The right ventricular  size is normal. There is normal pulmonary artery systolic pressure.  4. The mitral valve is normal in structure. Mild mitral valve  regurgitation. No evidence of mitral stenosis.  5. The aortic valve is normal in structure. Aortic valve regurgitation is  mild. No aortic stenosis is present.  6. The inferior vena cava is  normal in size with greater than 50%  respiratory  variability, suggesting right atrial pressure of 3 mmHg.   Cath 10/04/2018:  LV end diastolic pressure is low.  There is no aortic valve stenosis.  No significant CAD.   Medical therapy for nonischemic cardiomyopathy, LV dysfunction.   )        Anesthesia Quick Evaluation

## 2023-04-28 ENCOUNTER — Other Ambulatory Visit: Payer: Self-pay | Admitting: Physician Assistant

## 2023-04-28 MED ORDER — METHOCARBAMOL 750 MG PO TABS: 750.0000 mg | ORAL_TABLET | Freq: Two times a day (BID) | ORAL | 2 refills | Status: DC | PRN

## 2023-04-28 MED ORDER — DOCUSATE SODIUM 100 MG PO CAPS: 100.0000 mg | ORAL_CAPSULE | Freq: Every day | ORAL | 2 refills | Status: DC | PRN

## 2023-04-28 MED ORDER — RIVAROXABAN 10 MG PO TABS: 10.0000 mg | ORAL_TABLET | Freq: Every day | ORAL | 0 refills | Status: DC

## 2023-04-28 MED ORDER — ONDANSETRON HCL 4 MG PO TABS: 4.0000 mg | ORAL_TABLET | Freq: Three times a day (TID) | ORAL | 0 refills | Status: DC | PRN

## 2023-04-28 MED ORDER — OXYCODONE-ACETAMINOPHEN 5-325 MG PO TABS
1.0000 | ORAL_TABLET | Freq: Four times a day (QID) | ORAL | 0 refills | Status: DC | PRN
Start: 2023-04-28 — End: 2023-05-04

## 2023-04-30 DIAGNOSIS — M1611 Unilateral primary osteoarthritis, right hip: Secondary | ICD-10-CM | POA: Diagnosis present

## 2023-04-30 MED ORDER — TRANEXAMIC ACID 1000 MG/10ML IV SOLN
2000.0000 mg | INTRAVENOUS | Status: AC
  Filled 2023-04-30 (×2): qty 20

## 2023-05-01 ENCOUNTER — Ambulatory Visit (HOSPITAL_COMMUNITY): Payer: 59

## 2023-05-01 ENCOUNTER — Encounter (HOSPITAL_COMMUNITY)
Admission: RE | Disposition: A | Discharge status: Skilled Nursing Facility | Payer: Self-pay | Source: Home / Self Care | Attending: Orthopaedic Surgery

## 2023-05-01 ENCOUNTER — Ambulatory Visit (HOSPITAL_COMMUNITY): Payer: 59 | Admitting: Physician Assistant

## 2023-05-01 ENCOUNTER — Observation Stay (HOSPITAL_COMMUNITY)
Admission: RE | Admit: 2023-05-01 | Discharge: 2023-05-04 | Disposition: A | Discharge status: Skilled Nursing Facility | Payer: 59 | Attending: Orthopaedic Surgery | Admitting: Orthopaedic Surgery

## 2023-05-01 ENCOUNTER — Observation Stay (HOSPITAL_COMMUNITY): Payer: 59

## 2023-05-01 ENCOUNTER — Ambulatory Visit (HOSPITAL_COMMUNITY): Payer: 59 | Admitting: Anesthesiology

## 2023-05-01 ENCOUNTER — Other Ambulatory Visit: Payer: Self-pay

## 2023-05-01 DIAGNOSIS — I130 Hypertensive heart and chronic kidney disease with heart failure and stage 1 through stage 4 chronic kidney disease, or unspecified chronic kidney disease: Secondary | ICD-10-CM | POA: Diagnosis not present

## 2023-05-01 DIAGNOSIS — N9989 Other postprocedural complications and disorders of genitourinary system: Secondary | ICD-10-CM | POA: Diagnosis not present

## 2023-05-01 DIAGNOSIS — R338 Other retention of urine: Secondary | ICD-10-CM | POA: Diagnosis not present

## 2023-05-01 DIAGNOSIS — J45909 Unspecified asthma, uncomplicated: Secondary | ICD-10-CM | POA: Diagnosis not present

## 2023-05-01 DIAGNOSIS — J449 Chronic obstructive pulmonary disease, unspecified: Secondary | ICD-10-CM | POA: Insufficient documentation

## 2023-05-01 DIAGNOSIS — I509 Heart failure, unspecified: Secondary | ICD-10-CM | POA: Diagnosis not present

## 2023-05-01 DIAGNOSIS — N189 Chronic kidney disease, unspecified: Secondary | ICD-10-CM | POA: Insufficient documentation

## 2023-05-01 DIAGNOSIS — Z79899 Other long term (current) drug therapy: Secondary | ICD-10-CM | POA: Insufficient documentation

## 2023-05-01 DIAGNOSIS — Z87891 Personal history of nicotine dependence: Secondary | ICD-10-CM | POA: Diagnosis not present

## 2023-05-01 DIAGNOSIS — Z85038 Personal history of other malignant neoplasm of large intestine: Secondary | ICD-10-CM | POA: Insufficient documentation

## 2023-05-01 DIAGNOSIS — M247 Protrusio acetabuli: Secondary | ICD-10-CM | POA: Insufficient documentation

## 2023-05-01 DIAGNOSIS — M1611 Unilateral primary osteoarthritis, right hip: Principal | ICD-10-CM | POA: Diagnosis present

## 2023-05-01 DIAGNOSIS — Z7901 Long term (current) use of anticoagulants: Secondary | ICD-10-CM | POA: Diagnosis not present

## 2023-05-01 DIAGNOSIS — Z96641 Presence of right artificial hip joint: Secondary | ICD-10-CM

## 2023-05-01 HISTORY — PX: TOTAL HIP ARTHROPLASTY: SHX124

## 2023-05-01 SURGERY — ARTHROPLASTY, HIP, TOTAL, ANTERIOR APPROACH
Anesthesia: Spinal | Site: Hip | Laterality: Right

## 2023-05-01 MED ORDER — HYDROMORPHONE HCL 1 MG/ML IJ SOLN
0.5000 mg | INTRAMUSCULAR | Status: DC | PRN
  Administered 2023-05-01: 1 mg via INTRAVENOUS
  Filled 2023-05-01 (×2): qty 1

## 2023-05-01 MED ORDER — ACETAMINOPHEN 500 MG PO TABS
1000.0000 mg | ORAL_TABLET | Freq: Once | ORAL | Status: AC
  Administered 2023-05-01: 1000 mg via ORAL
  Filled 2023-05-01: qty 2

## 2023-05-01 MED ORDER — SODIUM CHLORIDE 0.9 % IR SOLN
Status: DC | PRN
  Administered 2023-05-01: 1000 mL

## 2023-05-01 MED ORDER — METHOCARBAMOL 500 MG PO TABS
500.0000 mg | ORAL_TABLET | Freq: Four times a day (QID) | ORAL | Status: DC | PRN
  Administered 2023-05-01: 500 mg via ORAL
  Filled 2023-05-01: qty 1

## 2023-05-01 MED ORDER — VANCOMYCIN HCL 1 G IV SOLR
INTRAVENOUS | Status: DC | PRN
  Administered 2023-05-01: 1000 mg via TOPICAL

## 2023-05-01 MED ORDER — PHENYLEPHRINE HCL-NACL 20-0.9 MG/250ML-% IV SOLN
INTRAVENOUS | Status: DC | PRN
Start: 2023-05-01 — End: 2023-05-01
  Administered 2023-05-01: 15 ug/min via INTRAVENOUS

## 2023-05-01 MED ORDER — ONDANSETRON HCL 4 MG PO TABS: 4.0000 mg | ORAL_TABLET | Freq: Four times a day (QID) | ORAL | Status: DC | PRN

## 2023-05-01 MED ORDER — PANTOPRAZOLE SODIUM 40 MG PO TBEC
40.0000 mg | DELAYED_RELEASE_TABLET | Freq: Every day | ORAL | Status: DC
  Administered 2023-05-01 – 2023-05-04 (×4): 40 mg via ORAL
  Filled 2023-05-01 (×4): qty 1

## 2023-05-01 MED ORDER — GLYCOPYRROLATE PF 0.2 MG/ML IJ SOSY
PREFILLED_SYRINGE | INTRAMUSCULAR | Status: AC
  Filled 2023-05-01: qty 1

## 2023-05-01 MED ORDER — BUPIVACAINE-MELOXICAM ER 400-12 MG/14ML IJ SOLN
INTRAMUSCULAR | Status: DC | PRN
  Administered 2023-05-01: 400 mg

## 2023-05-01 MED ORDER — LOSARTAN POTASSIUM 50 MG PO TABS
50.0000 mg | ORAL_TABLET | Freq: Every day | ORAL | Status: DC
  Administered 2023-05-02 – 2023-05-04 (×3): 50 mg via ORAL
  Filled 2023-05-01 (×3): qty 1

## 2023-05-01 MED ORDER — MIDAZOLAM HCL 2 MG/2ML IJ SOLN
INTRAMUSCULAR | Status: DC | PRN
Start: 2023-05-01 — End: 2023-05-01
  Administered 2023-05-01: 2 mg via INTRAVENOUS

## 2023-05-01 MED ORDER — TRANEXAMIC ACID-NACL 1000-0.7 MG/100ML-% IV SOLN: 1000.0000 mg | INTRAVENOUS | Status: DC

## 2023-05-01 MED ORDER — TRANEXAMIC ACID-NACL 1000-0.7 MG/100ML-% IV SOLN
1000.0000 mg | Freq: Once | INTRAVENOUS | Status: AC
  Administered 2023-05-01: 1000 mg via INTRAVENOUS
  Filled 2023-05-01: qty 100

## 2023-05-01 MED ORDER — ACETAMINOPHEN 10 MG/ML IV SOLN: 1000.0000 mg | Freq: Once | INTRAVENOUS | Status: DC | PRN

## 2023-05-01 MED ORDER — FENTANYL CITRATE (PF) 250 MCG/5ML IJ SOLN
INTRAMUSCULAR | Status: AC
  Filled 2023-05-01: qty 5

## 2023-05-01 MED ORDER — DEXAMETHASONE SODIUM PHOSPHATE 4 MG/ML IJ SOLN
INTRAMUSCULAR | Status: DC | PRN
Start: 2023-05-01 — End: 2023-05-01
  Administered 2023-05-01: 4 mg via INTRAVENOUS

## 2023-05-01 MED ORDER — CEFAZOLIN SODIUM-DEXTROSE 2-4 GM/100ML-% IV SOLN
2.0000 g | INTRAVENOUS | Status: AC
  Administered 2023-05-01: 2 g via INTRAVENOUS

## 2023-05-01 MED ORDER — MIDAZOLAM HCL 2 MG/2ML IJ SOLN
INTRAMUSCULAR | Status: AC
  Filled 2023-05-01: qty 2

## 2023-05-01 MED ORDER — OXYCODONE HCL 5 MG PO TABS
10.0000 mg | ORAL_TABLET | ORAL | Status: DC | PRN
  Administered 2023-05-01: 10 mg via ORAL
  Filled 2023-05-01: qty 2

## 2023-05-01 MED ORDER — ACETAMINOPHEN 160 MG/5ML PO SOLN: 325.0000 mg | Freq: Once | ORAL | Status: DC | PRN

## 2023-05-01 MED ORDER — ONDANSETRON HCL 4 MG/2ML IJ SOLN
INTRAMUSCULAR | Status: DC | PRN
  Administered 2023-05-01: 4 mg via INTRAVENOUS

## 2023-05-01 MED ORDER — PROPOFOL 500 MG/50ML IV EMUL
INTRAVENOUS | Status: DC | PRN
Start: 2023-05-01 — End: 2023-05-01
  Administered 2023-05-01: 75 ug/kg/min via INTRAVENOUS

## 2023-05-01 MED ORDER — FENTANYL CITRATE (PF) 250 MCG/5ML IJ SOLN
INTRAMUSCULAR | Status: DC | PRN
Start: 2023-05-01 — End: 2023-05-01
  Administered 2023-05-01: 50 ug via INTRAVENOUS

## 2023-05-01 MED ORDER — AMIODARONE HCL 200 MG PO TABS
100.0000 mg | ORAL_TABLET | Freq: Every day | ORAL | Status: DC
  Administered 2023-05-02 – 2023-05-04 (×3): 100 mg via ORAL
  Filled 2023-05-01 (×3): qty 1

## 2023-05-01 MED ORDER — METOCLOPRAMIDE HCL 5 MG PO TABS: 5.0000 mg | ORAL_TABLET | Freq: Three times a day (TID) | ORAL | Status: DC | PRN

## 2023-05-01 MED ORDER — DROPERIDOL 2.5 MG/ML IJ SOLN: 0.6250 mg | Freq: Once | INTRAMUSCULAR | Status: DC | PRN

## 2023-05-01 MED ORDER — ACETAMINOPHEN 325 MG PO TABS
325.0000 mg | ORAL_TABLET | Freq: Four times a day (QID) | ORAL | Status: DC | PRN
  Administered 2023-05-02: 325 mg via ORAL
  Administered 2023-05-03: 650 mg via ORAL
  Filled 2023-05-01 (×2): qty 2

## 2023-05-01 MED ORDER — METHOCARBAMOL 1000 MG/10ML IJ SOLN: 500.0000 mg | Freq: Four times a day (QID) | INTRAVENOUS | Status: DC | PRN

## 2023-05-01 MED ORDER — EPHEDRINE SULFATE-NACL 50-0.9 MG/10ML-% IV SOSY
PREFILLED_SYRINGE | INTRAVENOUS | Status: DC | PRN
Start: 2023-05-01 — End: 2023-05-01
  Administered 2023-05-01 (×2): 5 mg via INTRAVENOUS

## 2023-05-01 MED ORDER — CEFAZOLIN SODIUM-DEXTROSE 2-4 GM/100ML-% IV SOLN
INTRAVENOUS | Status: AC
  Filled 2023-05-01: qty 100

## 2023-05-01 MED ORDER — VANCOMYCIN HCL 1000 MG IV SOLR
INTRAVENOUS | Status: AC
  Filled 2023-05-01: qty 20

## 2023-05-01 MED ORDER — ONDANSETRON HCL 4 MG/2ML IJ SOLN: 4.0000 mg | Freq: Four times a day (QID) | INTRAMUSCULAR | Status: DC | PRN

## 2023-05-01 MED ORDER — ACETAMINOPHEN 325 MG PO TABS: 325.0000 mg | ORAL_TABLET | Freq: Once | ORAL | Status: DC | PRN

## 2023-05-01 MED ORDER — EPHEDRINE 5 MG/ML INJ
INTRAVENOUS | Status: AC
  Filled 2023-05-01: qty 5

## 2023-05-01 MED ORDER — GLYCOPYRROLATE 0.2 MG/ML IJ SOLN
INTRAMUSCULAR | Status: DC | PRN
Start: 2023-05-01 — End: 2023-05-01
  Administered 2023-05-01: .2 mg via INTRAVENOUS

## 2023-05-01 MED ORDER — BUPIVACAINE IN DEXTROSE 0.75-8.25 % IT SOLN
INTRATHECAL | Status: DC | PRN
  Administered 2023-05-01: 2 mL via INTRATHECAL

## 2023-05-01 MED ORDER — TRANEXAMIC ACID 1000 MG/10ML IV SOLN
INTRAVENOUS | Status: DC | PRN
  Administered 2023-05-01: 2000 mg via TOPICAL

## 2023-05-01 MED ORDER — CHLORHEXIDINE GLUCONATE 0.12 % MT SOLN
OROMUCOSAL | Status: AC
  Administered 2023-05-01: 15 mL via OROMUCOSAL
  Filled 2023-05-01: qty 15

## 2023-05-01 MED ORDER — LACTATED RINGERS IV SOLN: INTRAVENOUS | Status: DC

## 2023-05-01 MED ORDER — CHLORHEXIDINE GLUCONATE 0.12 % MT SOLN: 15.0000 mL | Freq: Once | OROMUCOSAL | Status: AC

## 2023-05-01 MED ORDER — FERROUS SULFATE 325 (65 FE) MG PO TABS
325.0000 mg | ORAL_TABLET | Freq: Three times a day (TID) | ORAL | Status: DC
  Administered 2023-05-01 – 2023-05-04 (×10): 325 mg via ORAL
  Filled 2023-05-01 (×10): qty 1

## 2023-05-01 MED ORDER — BUPIVACAINE-MELOXICAM ER 400-12 MG/14ML IJ SOLN
INTRAMUSCULAR | Status: AC
  Filled 2023-05-01: qty 1

## 2023-05-01 MED ORDER — DEXAMETHASONE SODIUM PHOSPHATE 10 MG/ML IJ SOLN
INTRAMUSCULAR | Status: AC
  Filled 2023-05-01: qty 2

## 2023-05-01 MED ORDER — OXYCODONE HCL 5 MG PO TABS: 5.0000 mg | ORAL_TABLET | ORAL | Status: DC | PRN

## 2023-05-01 MED ORDER — POVIDONE-IODINE 10 % EX SWAB
2.0000 | Freq: Once | CUTANEOUS | Status: AC
  Administered 2023-05-01: 2 via TOPICAL

## 2023-05-01 MED ORDER — SORBITOL 70 % SOLN: 30.0000 mL | Freq: Every day | Status: DC | PRN

## 2023-05-01 MED ORDER — AMLODIPINE BESYLATE 5 MG PO TABS
5.0000 mg | ORAL_TABLET | Freq: Every day | ORAL | Status: DC
  Administered 2023-05-02 – 2023-05-04 (×3): 5 mg via ORAL
  Filled 2023-05-01 (×3): qty 1

## 2023-05-01 MED ORDER — POLYETHYLENE GLYCOL 3350 17 G PO PACK
17.0000 g | PACK | Freq: Every day | ORAL | Status: DC
  Administered 2023-05-01 – 2023-05-04 (×4): 17 g via ORAL
  Filled 2023-05-01 (×4): qty 1

## 2023-05-01 MED ORDER — SODIUM CHLORIDE 0.9 % IV SOLN: INTRAVENOUS | Status: DC

## 2023-05-01 MED ORDER — MAGNESIUM CITRATE PO SOLN
1.0000 | Freq: Once | ORAL | Status: AC | PRN
  Administered 2023-05-01: 1 via ORAL

## 2023-05-01 MED ORDER — MENTHOL 3 MG MT LOZG: 1.0000 | LOZENGE | OROMUCOSAL | Status: DC | PRN

## 2023-05-01 MED ORDER — DEXAMETHASONE SODIUM PHOSPHATE 10 MG/ML IJ SOLN
10.0000 mg | Freq: Once | INTRAMUSCULAR | Status: AC
  Administered 2023-05-02: 10 mg via INTRAVENOUS
  Filled 2023-05-01: qty 1

## 2023-05-01 MED ORDER — DIPHENHYDRAMINE HCL 12.5 MG/5ML PO ELIX: 25.0000 mg | ORAL_SOLUTION | ORAL | Status: DC | PRN

## 2023-05-01 MED ORDER — PRONTOSAN WOUND IRRIGATION OPTIME
TOPICAL | Status: DC | PRN
  Administered 2023-05-01: 350 mL via TOPICAL

## 2023-05-01 MED ORDER — OXYCODONE HCL ER 10 MG PO T12A
10.0000 mg | EXTENDED_RELEASE_TABLET | Freq: Two times a day (BID) | ORAL | Status: DC
  Administered 2023-05-01 – 2023-05-04 (×7): 10 mg via ORAL
  Filled 2023-05-01 (×7): qty 1

## 2023-05-01 MED ORDER — ONDANSETRON HCL 4 MG/2ML IJ SOLN
INTRAMUSCULAR | Status: AC
  Filled 2023-05-01: qty 4

## 2023-05-01 MED ORDER — CEFAZOLIN SODIUM-DEXTROSE 2-4 GM/100ML-% IV SOLN
2.0000 g | Freq: Four times a day (QID) | INTRAVENOUS | Status: AC
  Administered 2023-05-01 (×2): 2 g via INTRAVENOUS
  Filled 2023-05-01 (×2): qty 100

## 2023-05-01 MED ORDER — ACETAMINOPHEN 500 MG PO TABS
1000.0000 mg | ORAL_TABLET | Freq: Four times a day (QID) | ORAL | Status: AC
  Administered 2023-05-01 – 2023-05-02 (×3): 1000 mg via ORAL
  Filled 2023-05-01 (×3): qty 2

## 2023-05-01 MED ORDER — CARVEDILOL 3.125 MG PO TABS
3.1250 mg | ORAL_TABLET | Freq: Two times a day (BID) | ORAL | Status: DC
  Administered 2023-05-01 – 2023-05-04 (×7): 3.125 mg via ORAL
  Filled 2023-05-01 (×7): qty 1

## 2023-05-01 MED ORDER — ORAL CARE MOUTH RINSE: 15.0000 mL | Freq: Once | OROMUCOSAL | Status: AC

## 2023-05-01 MED ORDER — RIVAROXABAN 10 MG PO TABS
10.0000 mg | ORAL_TABLET | Freq: Every day | ORAL | Status: DC
  Administered 2023-05-02 – 2023-05-04 (×3): 10 mg via ORAL
  Filled 2023-05-01 (×3): qty 1

## 2023-05-01 MED ORDER — 0.9 % SODIUM CHLORIDE (POUR BTL) OPTIME
TOPICAL | Status: DC | PRN
  Administered 2023-05-01: 1000 mL

## 2023-05-01 MED ORDER — HYDROMORPHONE HCL 1 MG/ML IJ SOLN: 0.2500 mg | INTRAMUSCULAR | Status: DC | PRN

## 2023-05-01 MED ORDER — ALUM & MAG HYDROXIDE-SIMETH 200-200-20 MG/5ML PO SUSP: 30.0000 mL | ORAL | Status: DC | PRN

## 2023-05-01 MED ORDER — METOCLOPRAMIDE HCL 5 MG/ML IJ SOLN: 5.0000 mg | Freq: Three times a day (TID) | INTRAMUSCULAR | Status: DC | PRN

## 2023-05-01 MED ORDER — DOCUSATE SODIUM 100 MG PO CAPS
100.0000 mg | ORAL_CAPSULE | Freq: Two times a day (BID) | ORAL | Status: DC
  Administered 2023-05-01 – 2023-05-04 (×6): 100 mg via ORAL
  Filled 2023-05-01 (×6): qty 1

## 2023-05-01 MED ORDER — PHENOL 1.4 % MT LIQD: 1.0000 | OROMUCOSAL | Status: DC | PRN

## 2023-05-01 SURGICAL SUPPLY — 65 items
ADH SKN CLS APL DERMABOND .7 (GAUZE/BANDAGES/DRESSINGS) ×1
BAG COUNTER SPONGE SURGICOUNT (BAG) ×2 IMPLANT
BAG DECANTER FOR FLEXI CONT (MISCELLANEOUS) ×2 IMPLANT
BAG SPNG CNTER NS LX DISP (BAG) ×1
BLADE SAG 18X100X1.27 (BLADE) ×2 IMPLANT
COVER PERINEAL POST (MISCELLANEOUS) ×2 IMPLANT
COVER SURGICAL LIGHT HANDLE (MISCELLANEOUS) ×2 IMPLANT
CUP SECTOR GRIPTON 58MM (Orthopedic Implant) IMPLANT
DERMABOND ADVANCED .7 DNX12 (GAUZE/BANDAGES/DRESSINGS) IMPLANT
DRAPE C-ARM 42X72 X-RAY (DRAPES) ×2 IMPLANT
DRAPE POUCH INSTRU U-SHP 10X18 (DRAPES) ×2 IMPLANT
DRAPE STERI IOBAN 125X83 (DRAPES) ×2 IMPLANT
DRAPE U-SHAPE 47X51 STRL (DRAPES) ×4 IMPLANT
DRESSING AQUACEL AG SP 3.5X10 (GAUZE/BANDAGES/DRESSINGS) IMPLANT
DRSG AQUACEL AG ADV 3.5X10 (GAUZE/BANDAGES/DRESSINGS) ×2 IMPLANT
DRSG AQUACEL AG SP 3.5X10 (GAUZE/BANDAGES/DRESSINGS) ×1
DURAPREP 26ML APPLICATOR (WOUND CARE) ×4 IMPLANT
ELECT BLADE 4.0 EZ CLEAN MEGAD (MISCELLANEOUS) ×1
ELECT REM PT RETURN 9FT ADLT (ELECTROSURGICAL) ×1
ELECTRODE BLDE 4.0 EZ CLN MEGD (MISCELLANEOUS) ×2 IMPLANT
ELECTRODE REM PT RTRN 9FT ADLT (ELECTROSURGICAL) ×2 IMPLANT
GLOVE BIOGEL PI IND STRL 7.0 (GLOVE) ×4 IMPLANT
GLOVE BIOGEL PI IND STRL 7.5 (GLOVE) ×10 IMPLANT
GLOVE ECLIPSE 7.0 STRL STRAW (GLOVE) ×4 IMPLANT
GLOVE SKINSENSE STRL SZ7.5 (GLOVE) ×2 IMPLANT
GLOVE SURG SYN 7.5 E (GLOVE) ×2 IMPLANT
GLOVE SURG SYN 7.5 PF PI (GLOVE) ×4 IMPLANT
GLOVE SURG UNDER POLY LF SZ7 (GLOVE) ×6 IMPLANT
GLOVE SURG UNDER POLY LF SZ7.5 (GLOVE) ×4 IMPLANT
GOWN STRL REUS W/ TWL LRG LVL3 (GOWN DISPOSABLE) IMPLANT
GOWN STRL REUS W/ TWL XL LVL3 (GOWN DISPOSABLE) ×2 IMPLANT
GOWN STRL REUS W/TWL LRG LVL3 (GOWN DISPOSABLE)
GOWN STRL REUS W/TWL XL LVL3 (GOWN DISPOSABLE) ×1
GOWN STRL SURGICAL XL XLNG (GOWN DISPOSABLE) ×2 IMPLANT
GOWN TOGA ZIPPER T7+ PEEL AWAY (MISCELLANEOUS) ×4 IMPLANT
HANDPIECE INTERPULSE COAX TIP (DISPOSABLE) ×1
HEAD CERAMIC BIOLOX 32 +1 (Hips) IMPLANT
HOOD PEEL AWAY T7 (MISCELLANEOUS) ×2 IMPLANT
INSERT NEUT HIP ALTRX 40 58 +4 (Insert) IMPLANT
IV NS IRRIG 3000ML ARTHROMATIC (IV SOLUTION) ×2 IMPLANT
KIT BASIN OR (CUSTOM PROCEDURE TRAY) ×2 IMPLANT
MARKER SKIN DUAL TIP RULER LAB (MISCELLANEOUS) ×2 IMPLANT
NDL SPNL 18GX3.5 QUINCKE PK (NEEDLE) ×2 IMPLANT
NEEDLE SPNL 18GX3.5 QUINCKE PK (NEEDLE) ×1 IMPLANT
PACK TOTAL JOINT (CUSTOM PROCEDURE TRAY) ×2 IMPLANT
PACK UNIVERSAL I (CUSTOM PROCEDURE TRAY) ×2 IMPLANT
SET HNDPC FAN SPRY TIP SCT (DISPOSABLE) ×2 IMPLANT
SOLUTION PRONTOSAN WOUND 350ML (IRRIGATION / IRRIGATOR) ×2 IMPLANT
STAPLER VISISTAT 35W (STAPLE) IMPLANT
STEM FEMORAL SZ8 STD ACTIS (Stem) IMPLANT
SUT ETHIBOND 2 V 37 (SUTURE) ×2 IMPLANT
SUT ETHILON 2 0 FS 18 (SUTURE) IMPLANT
SUT VIC AB 0 CT1 27 (SUTURE) ×1
SUT VIC AB 0 CT1 27XBRD ANBCTR (SUTURE) ×2 IMPLANT
SUT VIC AB 1 CTX 36 (SUTURE) ×1
SUT VIC AB 1 CTX36XBRD ANBCTR (SUTURE) ×2 IMPLANT
SUT VIC AB 2-0 CT1 27 (SUTURE) ×2
SUT VIC AB 2-0 CT1 TAPERPNT 27 (SUTURE) ×4 IMPLANT
SYR 50ML LL SCALE MARK (SYRINGE) ×2 IMPLANT
TOWEL GREEN STERILE (TOWEL DISPOSABLE) ×2 IMPLANT
TRAY CATH INTERMITTENT SS 16FR (CATHETERS) IMPLANT
TRAY FOLEY W/BAG SLVR 16FR (SET/KITS/TRAYS/PACK)
TRAY FOLEY W/BAG SLVR 16FR ST (SET/KITS/TRAYS/PACK) IMPLANT
TUBE SUCT ARGYLE STRL (TUBING) ×2 IMPLANT
YANKAUER SUCT BULB TIP NO VENT (SUCTIONS) ×2 IMPLANT

## 2023-05-01 NOTE — Progress Notes (Signed)
Patient requested that foley catheter be removed at this time due to burning at penis.  Foley removed with no complications. Patient is due to void.

## 2023-05-01 NOTE — Evaluation (Signed)
Physical Therapy Evaluation Patient Details Name: Joe Garcia MRN: [id number] DOB: September 13, 1950 Today's Date: 05/01/2023  History of Present Illness  Pt is 72 year old presented to [facility] on  05/01/23 for rt THR. PMH - HTN, colon CA, neuropathy, asthma/copd  Clinical Impression  Pt admitted with above diagnosis and presents to PT with functional limitations due to deficits listed below (See PT problem list). Pt needs skilled PT to maximize independence and safety. Pt reports he has no assist at home so will need to be fairly independent before return. Today had had pain meds which he reports have made him a little dizzy so limited mobility to standing and side stepping up side of bed. Expect he will make good progress.           If plan is discharge home, recommend the following: A little help with walking and/or transfers;A little help with bathing/dressing/bathroom   Can travel by private vehicle        Equipment Recommendations None recommended by PT  Recommendations for Other Services       Functional Status Assessment Patient has had a recent decline in their functional status and demonstrates the ability to make significant improvements in function in a reasonable and predictable amount of time.     Precautions / Restrictions Precautions Precautions: Fall Restrictions Weight Bearing Restrictions: No      Mobility  Bed Mobility Overal bed mobility: Needs Assistance Bed Mobility: Supine to Sit, Sit to Supine     Supine to sit: Min assist, HOB elevated Sit to supine: Min assist   General bed mobility comments: Assist to bring RLE off/on to bed    Transfers Overall transfer level: Needs assistance Equipment used: Rolling walker (2 wheels) Transfers: Sit to/from Stand Sit to Stand: Min assist           General transfer comment: Assist to power up. Verbal cues for hand placement    Ambulation/Gait               General Gait Details: Pt  dizzy/lightheaded in standing so limited to side stepping up side of bed 3' with min assist  Stairs            Wheelchair Mobility     Tilt Bed    Modified Rankin (Stroke Patients Only)       Balance Overall balance assessment: Mild deficits observed, not formally tested                                           Pertinent Vitals/Pain Pain Assessment Pain Assessment: 0-10 Pain Score: 6  Pain Location: rt hip Pain Descriptors / Indicators: Aching Pain Intervention(s): Limited activity within patient's tolerance, Monitored during session, Premedicated before session, Repositioned, Ice applied    Home Living Family/patient expects to be discharged to:: Private residence Living Arrangements: Alone   Type of Home: Apartment Home Access: Elevator       Home Layout: One level Home Equipment: Agricultural consultant (2 wheels);Cane - single point Additional Comments: Pt reports no assistance at home post DC    Prior Function Prior Level of Function : Independent/Modified Independent             Mobility Comments: Using cane       Extremity/Trunk Assessment   Upper Extremity Assessment Upper Extremity Assessment: Overall WFL for tasks assessed    Lower Extremity Assessment  Lower Extremity Assessment: RLE deficits/detail RLE Deficits / Details: limited by post op pain       Communication   Communication Communication: No apparent difficulties  Cognition Arousal: Alert Behavior During Therapy: WFL for tasks assessed/performed Overall Cognitive Status: Within Functional Limits for tasks assessed                                          General Comments      Exercises     Assessment/Plan    PT Assessment Patient needs continued PT services  PT Problem List Decreased strength;Decreased activity tolerance;Decreased mobility;Pain;Decreased knowledge of use of DME       PT Treatment Interventions DME instruction;Gait  training;Functional mobility training;Therapeutic activities;Therapeutic exercise;Patient/family education    PT Goals (Current goals can be found in the Care Plan section)  Acute Rehab PT Goals Patient Stated Goal: return home PT Goal Formulation: With patient Time For Goal Achievement: 05/08/23 Potential to Achieve Goals: Good    Frequency 7X/week     Co-evaluation               AM-PAC PT "6 Clicks" Mobility  Outcome Measure Help needed turning from your back to your side while in a flat bed without using bedrails?: A Little Help needed moving from lying on your back to sitting on the side of a flat bed without using bedrails?: A Little Help needed moving to and from a bed to a chair (including a wheelchair)?: A Little Help needed standing up from a chair using your arms (e.g., wheelchair or bedside chair)?: A Little Help needed to walk in hospital room?: Total Help needed climbing 3-5 steps with a railing? : Total 6 Click Score: 14    End of Session Equipment Utilized During Treatment: Gait belt Activity Tolerance: Other (comment) (limited by dizziness/lightheadedness) Patient left: in bed;with call bell/phone within reach;with bed alarm set Nurse Communication: Mobility status PT Visit Diagnosis: Other abnormalities of gait and mobility (R26.89);Pain Pain - Right/Left: Right Pain - part of body: Hip    Time: 1610-9604 PT Time Calculation (min) (ACUTE ONLY): 16 min   Charges:   PT Evaluation $PT Eval Low Complexity: 1 Low   PT General Charges $$ ACUTE PT VISIT: 1 Visit         [facility] PT Acute Rehabilitation Services Office [phone]   Angelina Ok [facility] 05/01/2023, 4:58 PM

## 2023-05-01 NOTE — H&P (Signed)
PREOPERATIVE H&P  Chief Complaint: right hip osteoarthritis  HPI: Joe Garcia is a 72 y.o. male who presents for surgical treatment of right hip osteoarthritis.  He denies any changes in medical history.  Past Surgical History:  Procedure Laterality Date   BIOPSY  06/27/2019   Procedure: BIOPSY;  Surgeon: Benancio Deeds, MD;  Location: [facility] ENDOSCOPY;  Service: Gastroenterology;;   CATARACT EXTRACTION     COLONOSCOPY Left 06/27/2019   Procedure: COLONOSCOPY;  Surgeon: Benancio Deeds, MD;  Location: [facility] ENDOSCOPY;  Service: Gastroenterology;  Laterality: Left;   COLONOSCOPY     LAPAROTOMY N/A 06/30/2019   Procedure: EXPLORATORY LAPAROTOMY;  Surgeon: Axel Filler, MD;  Location: [facility] OR;  Service: General;  Laterality: N/A;   LEFT HEART CATH AND CORONARY ANGIOGRAPHY N/A 10/04/2018   Procedure: LEFT HEART CATH AND CORONARY ANGIOGRAPHY;  Surgeon: Corky Crafts, MD;  Location: [facility] INVASIVE CV LAB;  Service: Cardiovascular;  Laterality: N/A;   PARTIAL COLECTOMY N/A 06/30/2019   Procedure: PARTIAL COLECTOMY;  Surgeon: Axel Filler, MD;  Location: [facility] OR;  Service: General;  Laterality: N/A;   PORTACATH PLACEMENT Left 08/02/2019   Procedure: INSERTION PORT-A-CATH LEFT CHEST;  Surgeon: Axel Filler, MD;  Location: [facility] OR;  Service: General;  Laterality: Left;   RIGHT/LEFT HEART CATH AND CORONARY ANGIOGRAPHY N/A 06/12/2017   Procedure: RIGHT/LEFT HEART CATH AND CORONARY ANGIOGRAPHY;  Surgeon: Dolores Patty, MD;  Location: [facility] INVASIVE CV LAB;  Service: Cardiovascular;  Laterality: N/A;   Social History   Socioeconomic History   Marital status: Single    Spouse name: Not on file   Number of children: 8   Years of education: Not on file   Highest education level: Not on file  Occupational History   Occupation: "it don't work for me", on disability  Tobacco Use   Smoking status: Former    Current packs/day: 0.00    Average packs/day: 0.1 packs/day for 45.0  years (4.5 ttl pk-yrs)    Types: Cigarettes    Start date: 77    Quit date: 2012    Years since quitting: 12.7   Smokeless tobacco: Never  Vaping Use   Vaping status: Never Used  Substance and Sexual Activity   Alcohol use: No    Comment: Pt states no etoh since April 2014   Drug use: Not Currently    Comment: Prior use of crack cocaine, quit 2012   Sexual activity: Never  Other Topics Concern   Not on file  Social History Narrative   Not on file   Social Determinants of Health   Financial Resource Strain: Not on File (04/21/2022)   Received from [employer]    Financial Resource Strain: 0  Food Insecurity: Not on File (04/21/2022)   Received from [facility] Insecurity    Food: 0  Transportation Needs: Not on File (04/21/2022)   Received from [employer] Needs    Transportation: 0  Physical Activity: Not on File (11/14/2021)   Received from [place], [place]   Physical Activity    Physical Activity: 0  Stress: Not on File (11/14/2021)   Received from [facility], [place]   Stress    Stress: 0  Social Connections: Not on File (04/06/2023)   Received from [facility]   Social Connections    Connectedness: 0   Family History  Problem Relation Age of Onset   Heart disease Father    Heart disease Daughter        "  fluid around heart"    Canavan disease Daughter    Cancer Daughter        unsure of type   Asthma Son    Heart failure Mother    Kidney failure Mother    Colon cancer Neg Hx    Esophageal cancer Neg Hx    Rectal cancer Neg Hx    Stomach cancer Neg Hx    Allergies  Allergen Reactions   Clarithromycin Shortness Of Breath   Lisinopril Swelling    Facial and tongue swelling 10/2012   Prior to Admission medications   Medication Sig Start Date End Date Taking? Authorizing Provider  albuterol (VENTOLIN HFA) 108 (90 Base) MCG/ACT inhaler Inhale 2 puffs into the lungs every 4 (four) hours as needed for wheezing or shortness of breath.  07/09/20  Yes Rodriguez-Southworth, Nettie Elm, PA-C  amiodarone (PACERONE) 200 MG tablet TAKE 1/2 TABLET (100 MG TOTAL) BY MOUTH DAILY(AM) 08/01/22  Yes Runell Gess, MD  amLODipine (NORVASC) 5 MG tablet TAKE 1 TABLET (5 MG TOTAL) BY MOUTH DAILY (AM) 10/28/22  Yes Runell Gess, MD  atorvastatin (LIPITOR) 10 MG tablet Take 10 mg by mouth at bedtime.   Yes [provider]  budesonide-formoterol (SYMBICORT) 160-4.5 MCG/ACT inhaler Inhale 2 puffs into the lungs 2 (two) times daily. 11/23/18  Yes Nyoka Cowden, MD  carvedilol (COREG) 3.125 MG tablet TAKE 1 TABLET (3.125 MG TOTAL) BY MOUTH 2 (TWO) TIMES DAILY WITH A MEAL. (AM+BEDTIME) 10/28/22  Yes Runell Gess, MD  docusate sodium (COLACE) 100 MG capsule Take 1 capsule (100 mg total) by mouth daily as needed. 04/28/23 04/27/24  Cristie Hem, PA-C  fluticasone (FLONASE) 50 MCG/ACT nasal spray Place 2 sprays into both nostrils daily as needed for allergies or rhinitis. 02/13/20  Yes Glade Lloyd, MD  furosemide (LASIX) 20 MG tablet TAKE 1 TABLET (20 MG TOTAL) BY MOUTH DAILY (AM) 10/28/22  Yes Runell Gess, MD  ipratropium-albuterol (DUONEB) 0.5-2.5 (3) MG/3ML SOLN Take 3 mLs by nebulization every 4 (four) hours as needed. 08/01/22  Yes Crain, Whitney L, PA  isosorbide-hydrALAZINE (BIDIL) 20-37.5 MG tablet TAKE 1 TABLET BY MOUTH 3 (THREE) TIMES DAILY (AM+NOON+BEDTIME) 10/28/22  Yes Runell Gess, MD  losartan (COZAAR) 50 MG tablet TAKE 1 TABLET (50 MG TOTAL) BY MOUTH DAILY (AM) 09/29/22  Yes Runell Gess, MD  methocarbamol (ROBAXIN-750) 750 MG tablet Take 1 tablet (750 mg total) by mouth 2 (two) times daily as needed for muscle spasms. 04/28/23   Cristie Hem, PA-C  montelukast (SINGULAIR) 10 MG tablet Take 10 mg by mouth at bedtime.   Yes [provider]  ondansetron (ZOFRAN) 4 MG tablet Take 1 tablet (4 mg total) by mouth every 8 (eight) hours as needed for nausea or vomiting. 04/28/23   Cristie Hem, PA-C   oxyCODONE-acetaminophen (PERCOCET) 5-325 MG tablet Take 1-2 tablets by mouth every 6 (six) hours as needed. To be taken after surgery 04/28/23   Cristie Hem, PA-C  rivaroxaban (XARELTO) 10 MG TABS tablet Take 1 tablet (10 mg total) by mouth daily. To be taken after surgery to prevent blood clots 04/28/23   Jari Sportsman L, PA-C  TRELEGY ELLIPTA 100-62.5-25 MCG/ACT AEPB Inhale 1 puff into the lungs daily. 09/11/22  Yes [provider]     Positive ROS: All other systems have been reviewed and were otherwise negative with the exception of those mentioned in the HPI and as above.  Physical Exam: General: Alert, no  acute distress Cardiovascular: No pedal edema Respiratory: No cyanosis, no use of accessory musculature GI: abdomen soft Skin: No lesions in the area of chief complaint Neurologic: Sensation intact distally Psychiatric: Patient is competent for consent with normal mood and affect Lymphatic: no lymphedema  MUSCULOSKELETAL: exam stable  Assessment: right hip osteoarthritis  Plan: Plan for Procedure(s): TOTAL HIP ARTHROPLASTY ANTERIOR APPROACH  The risks benefits and alternatives were discussed with the patient including but not limited to the risks of nonoperative treatment, versus surgical intervention including infection, bleeding, nerve injury,  blood clots, cardiopulmonary complications, morbidity, mortality, among others, and they were willing to proceed.   Glee Arvin, MD 05/01/2023 7:39 AM

## 2023-05-01 NOTE — Anesthesia Procedure Notes (Addendum)
Spinal  Patient location during procedure: OR Start time: 05/01/2023 10:17 AM End time: 05/01/2023 10:20 AM Reason for block: surgical anesthesia Staffing Performed: anesthesiologist  Anesthesiologist: Shelton Silvas, MD Performed by: Shelton Silvas, MD Authorized by: Shelton Silvas, MD   Preanesthetic Checklist Completed: patient identified, IV checked, site marked, risks and benefits discussed, surgical consent, monitors and equipment checked, pre-op evaluation and timeout performed Spinal Block Patient position: sitting Prep: DuraPrep and site prepped and draped Location: L3-4 Injection technique: single-shot Needle Needle type: Pencan  Needle gauge: 24 G Needle length: 10 cm Needle insertion depth: 10 cm Additional Notes Patient tolerated well. No immediate complications.  Functioning IV was confirmed and monitors were applied. Sterile prep and drape, including hand hygiene and sterile gloves were used. The patient was positioned and the back was prepped. The skin was anesthetized with lidocaine. Free flow of clear CSF was obtained prior to injecting local anesthetic into the CSF. The spinal needle aspirated freely following injection. The needle was carefully withdrawn. The patient tolerated the procedure well.

## 2023-05-01 NOTE — Transfer of Care (Signed)
Immediate Anesthesia Transfer of Care Note  Patient: Joe Garcia  Procedure(s) Performed: TOTAL HIP ARTHROPLASTY ANTERIOR APPROACH (Right: Hip)  Patient Location: PACU  Anesthesia Type: Spinal  Level of Consciousness: awake, alert , and oriented  Airway & Oxygen Therapy: Patient Spontanous Breathing and Patient connected to nasal cannula oxygen  Post-op Assessment: Report given to RN and Post -op Vital signs reviewed and stable  Post vital signs: Reviewed and stable        Patients Stated Pain Goal: 3 (05/01/23 1415)  Complications: No notable events documented.

## 2023-05-01 NOTE — Discharge Instructions (Signed)

## 2023-05-01 NOTE — Anesthesia Procedure Notes (Deleted)
Spinal  Staffing Performed by: Shelton Silvas, MD Authorized by: Shelton Silvas, MD

## 2023-05-01 NOTE — Progress Notes (Signed)
Arrived to 5N room 5N20 from [facility].  Oriented to room and call bell. Vitals obtained.    05/01/23 1607  Vitals  Temp 98.4 F (36.9 C)  Temp Source Oral  BP (!) 158/77  MAP (mmHg) 95  BP Location Left Arm  BP Method Automatic  Patient Position (if appropriate) Lying  Pulse Rate (!) 54  Pulse Rate Source Dinamap  Resp 17  Level of Consciousness  Level of Consciousness Alert  MEWS COLOR  MEWS Score Color Green  Oxygen Therapy  SpO2 100 %  O2 Device Room Air  MEWS Score  MEWS Temp 0  MEWS Systolic 0  MEWS Pulse 0  MEWS RR 0  MEWS LOC 0  MEWS Score 0

## 2023-05-01 NOTE — Op Note (Signed)
TOTAL HIP ARTHROPLASTY ANTERIOR APPROACH  Procedure Note DELEON PASSE   [id number]  Pre-op Diagnosis: right hip osteoarthritis, protrusio     Post-op Diagnosis: same  Operative Findings Protrusio deformity   Operative Procedures  1. Total hip replacement; Right hip; uncemented [id number]   Surgeon: Gershon Mussel, M.D.  Assist: Oneal Grout, PA-C   Anesthesia: spinal  Prosthesis: Depuy Acetabulum: Pinnacle 58 mm Femur: Actis 8 STD Head: 40 mm size: +1.5 Liner: +4 Bearing Type: ceramic/poly  Total Hip Arthroplasty (Anterior Approach) Op Note:  After informed consent was obtained and the operative extremity marked in the holding area, the patient was brought back to the operating room and placed supine on the HANA table. Next, the operative extremity was prepped and draped in normal sterile fashion. Surgical timeout occurred verifying patient identification, surgical site, surgical procedure and administration of antibiotics.  A 10 cm longitudinal incision was made starting from 2 fingerbreadths lateral and inferior to the ASIS towards the lateral aspect of the patella.  A Hueter approach to the hip was performed, using the interval between tensor fascia lata and sartorius.  Dissection was carried bluntly down onto the anterior hip capsule. The lateral femoral circumflex vessels were identified and coagulated. A capsulotomy was performed and the capsular flaps tagged for later repair.  The neck osteotomy was performed. The femoral head was removed which showed severe wear and denuded cartilage, the acetabular rim was cleared of soft tissue and osteophytes and attention was turned to reaming the acetabulum.  Sequential reaming was performed under fluoroscopic guidance.  I was careful not to ream medial to the ilioischial line due to protrusio.  I was able to find excellent bleeding cancellous bony circumferentially.  We reamed to a size 57 mm, and then impacted the acetabular  shell which gave excellent rim fit.    The liner was then placed after irrigation and attention turned to the femur.  After placing the femoral hook, the leg was taken to externally rotated, extended and adducted position taking care to perform soft tissue releases to allow for adequate mobilization of the femur. Soft tissue was cleared from the shoulder of the greater trochanter and the hook elevator used to improve exposure of the proximal femur. Sequential broaching performed up to a size 8. Trial neck and head were placed. The leg was brought back up to neutral and the construct reduced.  Antibiotic irrigation was placed in the surgical wound.  The position and sizing of components, offset and leg lengths were checked using fluoroscopy. Stability of the construct was checked in extension and external rotation without any subluxation, shuck or impingement of prosthesis. We dislocated the prosthesis, dropped the leg back into position, removed trial components, and irrigated copiously. The final stem and head was then placed, the leg brought back up, the system reduced and fluoroscopy used to verify positioning.  We irrigated, obtained hemostasis and closed the capsule using #2 ethibond suture.  One gram of vancomycin powder was placed in the surgical bed.   One gram of topical tranexamic acid was injected into the joint.  The fascia was closed with #1 vicryl plus, the deep fat layer was closed with 0 vicryl, the subcutaneous layers closed with 2.0 Vicryl Plus and the skin closed with 2.0 nylon and dermabond. A sterile dressing was applied. The patient was awakened in the operating room and taken to recovery in stable condition.  All sponge, needle, and instrument counts were correct at the end of the case.  Tessa Lerner, my PA, was a medical necessity for opening, closing, limb positioning, retracting, exposing, and overall facilitation and timely completion of the surgery.  Position: supine   Complications: see description of procedure.  Time Out: performed   Drains/Packing: none  Estimated blood loss: see anesthesia record  Returned to Recovery Room: in good condition.   Antibiotics: yes   Mechanical VTE (DVT) Prophylaxis: sequential compression devices, TED thigh-high  Chemical VTE (DVT) Prophylaxis: xarelto POD 1   Fluid Replacement: see anesthesia record  Specimens Removed: 1 to pathology   Sponge and Instrument Count Correct? yes   PACU: portable radiograph - low AP   Plan/RTC: Return in 2 weeks for staple removal. Weight Bearing/Load Lower Extremity: full  Hip precautions: none Suture Removal: 2 weeks   N. Glee Arvin, MD [facility] 11:35 AM   Implant Name Type Inv. Item Serial No. Manufacturer Lot No. LRB No. Used Action  CUP SECTOR GRIPTON - [id number] Orthopedic Implant CUP SECTOR GRIPTON  [employer]PU[employer]TH[employer] [id number] Right 1 Implanted  INSERT NEUT HIP ALTRX 40 58 +4 - [id number] Insert INSERT NEUT HIP ALTRX 40 58 +4  DEPUY ORTHOPAEDICS [id number] Right 1 Implanted  STEM FEMORAL SZ8 STD ACTIS - [id number] Stem STEM FEMORAL SZ8 STD ACTIS  DEPUY ORTHOPAEDICS [id number] Right 1 Implanted  HEAD CERAMIC BIOLOX 32 +1 - [id number] Hips HEAD CERAMIC BIOLOX 32 +1  DEPUY ORTHOPA[employer] [id number] Right 1 Implanted

## 2023-05-01 NOTE — Progress Notes (Signed)
Patient alert and oriented, surgical site clean and dry. Report given to 5N nurse all questions answered. Patient transfer to 5N per order.

## 2023-05-01 NOTE — Anesthesia Postprocedure Evaluation (Signed)
Anesthesia Post Note  Patient: Joe Garcia  Procedure(s) Performed: TOTAL HIP ARTHROPLASTY ANTERIOR APPROACH (Right: Hip)     Patient location during evaluation: PACU Anesthesia Type: Spinal Level of consciousness: oriented and awake and alert Pain management: pain level controlled Vital Signs Assessment: post-procedure vital signs reviewed and stable Respiratory status: spontaneous breathing, respiratory function stable and patient connected to nasal cannula oxygen Cardiovascular status: blood pressure returned to baseline and stable Postop Assessment: no headache, no backache and no apparent nausea or vomiting Anesthetic complications: no  No notable events documented.  Last Vitals:  Vitals:   05/01/23 1330 05/01/23 1400  BP: 115/61 115/67  Pulse: 60 (!) 52  Resp: 17 20  Temp:  36.7 C  SpO2: 99% 100%    Last Pain:  Vitals:   05/01/23 1415  TempSrc:   PainSc: 7                  Shelton Silvas

## 2023-05-02 DIAGNOSIS — M1611 Unilateral primary osteoarthritis, right hip: Secondary | ICD-10-CM | POA: Diagnosis not present

## 2023-05-02 DIAGNOSIS — N9989 Other postprocedural complications and disorders of genitourinary system: Secondary | ICD-10-CM | POA: Diagnosis not present

## 2023-05-02 LAB — CBC
HCT: 42.6 % (ref 39.0–52.0)
Hemoglobin: 14.2 g/dL (ref 13.0–17.0)
MCH: 28.7 pg (ref 26.0–34.0)
MCHC: 33.3 g/dL (ref 30.0–36.0)
MCV: 86.2 fL (ref 80.0–100.0)
Platelets: 197 10*3/uL (ref 150–400)
RBC: 4.94 MIL/uL (ref 4.22–5.81)
RDW: 14.6 % (ref 11.5–15.5)
WBC: 13.1 10*3/uL — ABNORMAL HIGH (ref 4.0–10.5)
nRBC: 0 % (ref 0.0–0.2)

## 2023-05-02 MED ORDER — ALBUTEROL SULFATE (2.5 MG/3ML) 0.083% IN NEBU
2.5000 mg | INHALATION_SOLUTION | RESPIRATORY_TRACT | Status: DC | PRN
  Administered 2023-05-02 – 2023-05-03 (×2): 2.5 mg via RESPIRATORY_TRACT
  Filled 2023-05-02 (×2): qty 3

## 2023-05-02 MED ORDER — ALBUTEROL SULFATE HFA 108 (90 BASE) MCG/ACT IN AERS: 2.0000 | INHALATION_SPRAY | RESPIRATORY_TRACT | Status: DC | PRN

## 2023-05-02 MED ORDER — TAMSULOSIN HCL 0.4 MG PO CAPS
0.4000 mg | ORAL_CAPSULE | Freq: Every day | ORAL | Status: DC
  Administered 2023-05-02 – 2023-05-04 (×3): 0.4 mg via ORAL
  Filled 2023-05-02 (×3): qty 1

## 2023-05-02 NOTE — Plan of Care (Signed)

## 2023-05-02 NOTE — Progress Notes (Addendum)
Patient ID: Joe Garcia, male   DOB: 03-12-1951, 72 y.o.   MRN: [id number]   Subjective: 1 Day Post-Op Procedure(s) (LRB): TOTAL HIP ARTHROPLASTY ANTERIOR APPROACH (Right) Patient reports pain as mild and moderate.   Patient asked to have foley removed. RN took it out , then he could not void. I and O cath times 2. Has some drip incontinence now. Will start flomax.  Objective: Vital signs in last 24 hours: Temp:  [97 F (36.1 C)-98.4 F (36.9 C)] 97.8 F (36.6 C) (10/05 0833) Pulse Rate:  [52-69] 64 (10/05 0833) Resp:  [12-20] 12 (10/05 0833) BP: (93-158)/(49-87) 153/87 (10/05 0833) SpO2:  [98 %-100 %] 100 % (10/05 0833)  Intake/Output from previous day: 10/04 0701 - 10/05 0700 In: 1211.9 [P.O.:240; I.V.:771.9; IV Piggyback:200] Out: 1925 [Urine:1775; Blood:150] Intake/Output this shift: Total I/O In: -  Out: 700 [Urine:700]  No results for input(s): "HGB" in the last 72 hours. No results for input(s): "WBC", "RBC", "HCT", "PLT" in the last 72 hours. No results for input(s): "NA", "K", "CL", "CO2", "BUN", "CREATININE", "GLUCOSE", "CALCIUM" in the last 72 hours. No results for input(s): "LABPT", "INR" in the last 72 hours.  Dressing intact. DG Pelvis Portable  Result Date: 05/01/2023 CLINICAL DATA:  Status post right hip arthroplasty. EXAM: PORTABLE PELVIS 1-2 VIEWS COMPARISON:  None Available. FINDINGS: Right hip arthroplasty in expected alignment. No periprosthetic lucency or fracture. Recent postsurgical change includes air and edema in the soft tissues. Chronic left hip arthropathy. IMPRESSION: Right hip arthroplasty without immediate postoperative complication. Electronically Signed   By: Narda Rutherford M.D.   On: 05/01/2023 13:00   DG HIP UNILAT WITH PELVIS 1V RIGHT  Result Date: 05/01/2023 CLINICAL DATA:  Elective surgery. EXAM: DG HIP (WITH OR WITHOUT PELVIS) 1V RIGHT COMPARISON:  None Available. FINDINGS: Eleven fluoroscopic spot views of the pelvis and right hip  obtained in the operating room. Sequential images during hip arthroplasty. Fluoroscopy time 25.8 seconds. Dose 2.7 mGy. IMPRESSION: Intraoperative fluoroscopy for right hip arthroplasty. Electronically Signed   By: Narda Rutherford M.D.   On: 05/01/2023 12:59   DG C-Arm 1-60 Min-No Report  Result Date: 05/01/2023 Fluoroscopy was utilized by the requesting physician.  No radiographic interpretation.   DG C-Arm 1-60 Min-No Report  Result Date: 05/01/2023 Fluoroscopy was utilized by the requesting physician.  No radiographic interpretation.    Assessment/Plan: 1 Day Post-Op Procedure(s) (LRB): TOTAL HIP ARTHROPLASTY ANTERIOR APPROACH (Right) 2. Post op urinary retention. In and out times 2 , if not voiding in 6 hrs then foley placment.  Plan: Patient was going to go home with son but he has moved. CNA daughter works at [employer] and she says they have 5 beds available. Start flomax,  place catheter if he is unable to void. Will not be discharged since family not available for him to go home. SNF   Eldred Manges 05/02/2023, 8:39 AM

## 2023-05-02 NOTE — Evaluation (Signed)
Occupational Therapy Evaluation Patient Details Name: Joe Garcia MRN: [id number] DOB: 10-03-1950 Today's Date: 05/02/2023   History of Present Illness Pt is 72 year old presented to [facility] on  05/01/23 for rt THR. PMH - HTN, colon CA, neuropathy, asthma/copd   Clinical Impression   Pt presents with decline in function and safety with ADLs and ADL mobility with impaired balance and endurance. PTA pt lived alone and was Ind with ADLs/selfcare, IADLs and used a cane for mobility. Pt currently required min A with LB ADLs, set up with UB ADLs, CGA with toilet transfers and toileting tasks, CGA with functional mobility using RW. Pt educated on ADL A/E and tub bench for home use. Pt would benefit from acute OT services to address impairments to maximize level of function and safety        If plan is discharge home, recommend the following: A little help with bathing/dressing/bathroom;A little help with walking and/or transfers;Assistance with cooking/housework;Assist for transportation    Functional Status Assessment  Patient has had a recent decline in their functional status and demonstrates the ability to make significant improvements in function in a reasonable and predictable amount of time.  Equipment Recommendations  Tub/shower bench;Other (comment) (reacher, long handle bath sponge, sock aid)    Recommendations for Other Services       Precautions / Restrictions Precautions Precautions: Fall Restrictions Weight Bearing Restrictions: No      Mobility Bed Mobility               General bed mobility comments: Pt in recliner upon arrival    Transfers Overall transfer level: Needs assistance Equipment used: Rolling walker (2 wheels) Transfers: Sit to/from Stand Sit to Stand: Contact guard assist                  Balance Overall balance assessment: Mild deficits observed, not formally tested                                         ADL  either performed or assessed with clinical judgement   ADL Overall ADL's : Needs assistance/impaired     Grooming: Wash/dry hands;Wash/dry face;Contact guard assist;Standing   Upper Body Bathing: Set up   Lower Body Bathing: Minimal assistance   Upper Body Dressing : Set up   Lower Body Dressing: Minimal assistance   Toilet Transfer: Contact guard assist;Ambulation;Rolling walker (2 wheels);BSC/3in1   Toileting- Clothing Manipulation and Hygiene: Contact guard assist;Sit to/from stand       Functional mobility during ADLs: Contact guard assist;Rolling walker (2 wheels) General ADL Comments: pt educated on ADL A/E for home use, tub bench     Vision Ability to See in Adequate Light: 0 Adequate Patient Visual Report: No change from baseline       Perception         Praxis         Pertinent Vitals/Pain Pain Assessment Pain Assessment: 0-10 Pain Score: 7  Pain Location: R hip Pain Descriptors / Indicators: Aching, Sore Pain Intervention(s): Premedicated before session, Monitored during session, Repositioned     Extremity/Trunk Assessment Upper Extremity Assessment Upper Extremity Assessment: Overall WFL for tasks assessed   Lower Extremity Assessment Lower Extremity Assessment: Defer to PT evaluation       Communication     Cognition Arousal: Alert Behavior During Therapy: WFL for tasks assessed/performed Overall Cognitive Status: Within Functional Limits  for tasks assessed                                       General Comments       Exercises     Shoulder Instructions      Home Living Family/patient expects to be discharged to:: Unsure Living Arrangements: Alone   Type of Home: Apartment Home Access: Elevator     Home Layout: One level     Bathroom Shower/Tub: Chief Strategy Officer: Standard     Home Equipment: Agricultural consultant (2 wheels);Cane - single point          Prior Functioning/Environment Prior  Level of Function : Independent/Modified Independent             Mobility Comments: Using cane ADLs Comments: Ind with ADLs/selfcare, IADLs, did not drive        OT Problem List: Impaired balance (sitting and/or standing);Pain;Decreased activity tolerance;Decreased knowledge of use of DME or AE      OT Treatment/Interventions: Self-care/ADL training;DME and/or AE instruction;Therapeutic activities;Patient/family education    OT Goals(Current goals can be found in the care plan section) Acute Rehab OT Goals Patient Stated Goal: go home OT Goal Formulation: With patient Time For Goal Achievement: 05/16/23 Potential to Achieve Goals: Good  OT Frequency: Min 2X/week    Co-evaluation              AM-PAC OT "6 Clicks" Daily Activity     Outcome Measure Help from another person eating meals?: None Help from another person taking care of personal grooming?: A Little Help from another person toileting, which includes using toliet, bedpan, or urinal?: A Little Help from another person bathing (including washing, rinsing, drying)?: A Little Help from another person to put on and taking off regular upper body clothing?: A Little Help from another person to put on and taking off regular lower body clothing?: A Little 6 Click Score: 19   End of Session Equipment Utilized During Treatment: Gait belt;Rolling walker (2 wheels);Other (comment) (3 in 1)  Activity Tolerance: Patient tolerated treatment well Patient left: in chair;with call bell/phone within reach;with family/visitor present  OT Visit Diagnosis: Other abnormalities of gait and mobility (R26.89);Pain Pain - Right/Left: Right Pain - part of body: Hip                Time: 1041-1105 OT Time Calculation (min): 24 min Charges:  OT General Charges $OT Visit: 1 Visit OT Evaluation $OT Eval Low Complexity: 1 Low OT Treatments $Self Care/Home Management : 8-22 mins   Galen Manila 05/02/2023, 11:30 AM

## 2023-05-02 NOTE — Progress Notes (Signed)
   05/02/23 0740  Urine Characteristics  Bladder Scan Volume (mL) 582 mL     Patient reports fullness, and urgency. Primary team informed.

## 2023-05-02 NOTE — Care Management Obs Status (Signed)
MEDICARE OBSERVATION STATUS NOTIFICATION   Patient Details  Name: Joe Garcia MRN: [id number] Date of Birth: 11-22-1950   Medicare Observation Status Notification Given:  Yes    Ronny Bacon, RN 05/02/2023, 7:17 AM

## 2023-05-02 NOTE — Progress Notes (Signed)
   05/02/23 0825  Urine Characteristics  Urinary Interventions Intermittent/Straight cath  Intermittent/Straight Cath (mL) 700 mL     I/O performed per order/policy.

## 2023-05-02 NOTE — Progress Notes (Addendum)
Physical Therapy Treatment Patient Details Name: Joe Garcia MRN: [id number] DOB: 09/15/1950 Today's Date: 05/02/2023   History of Present Illness Pt is 72 year old presented to [facility] on  05/01/23 for rt THR. PMH - HTN, colon CA, neuropathy, asthma/copd    PT Comments  Pt greeted resting in bed and agreeable to session, however limited due to nausea/emesis. Pt requiring grossly CGA for bed mobility, transfers and short gait in room with RW support. Pt needing light cues for gait mechanics throughout. Pt able to complete standing RLE therex for increased ROM before needing to sit due to nausea. Plan to progress gait distance, activity tolerance and ROM/strength in PM session. Pt continues to benefit from skilled PT services to progress toward functional mobility goals.      If plan is discharge home, recommend the following: A little help with walking and/or transfers;A little help with bathing/dressing/bathroom   Can travel by private vehicle        Equipment Recommendations  None recommended by PT    Recommendations for Other Services       Precautions / Restrictions Precautions Precautions: Fall Restrictions Weight Bearing Restrictions: No     Mobility  Bed Mobility Overal bed mobility: Needs Assistance Bed Mobility: Supine to Sit, Sit to Supine     Supine to sit: Contact guard     General bed mobility comments: CGA for safety, no physical assist needed    Transfers Overall transfer level: Needs assistance Equipment used: Rolling walker (2 wheels) Transfers: Sit to/from Stand Sit to Stand: Contact guard assist           General transfer comment: good hand placement    Ambulation/Gait Ambulation/Gait assistance: Contact guard assist Gait Distance (Feet): 20 Feet Assistive device: Rolling walker (2 wheels) Gait Pattern/deviations: Step-through pattern, Decreased stride length, Decreased weight shift to right Gait velocity: decr     General Gait  Details: short gait in room as pt with nausea, cues for closer RW proximity and upright trunk   Stairs             Wheelchair Mobility     Tilt Bed    Modified Rankin (Stroke Patients Only)       Balance Overall balance assessment: Mild deficits observed, not formally tested                                          Cognition Arousal: Alert Behavior During Therapy: WFL for tasks assessed/performed Overall Cognitive Status: Within Functional Limits for tasks assessed                                          Exercises Total Joint Exercises Marching in Standing: AROM, Right, 10 reps, Standing    General Comments        Pertinent Vitals/Pain Pain Assessment Pain Assessment: Faces Faces Pain Scale: Hurts little more Pain Location: rt hip Pain Descriptors / Indicators: Aching, Sore Pain Intervention(s): Monitored during session, Limited activity within patient's tolerance    Home Living                          Prior Function            PT Goals (current goals can now be found  in the care plan section) Acute Rehab PT Goals Patient Stated Goal: return home PT Goal Formulation: With patient Time For Goal Achievement: 05/08/23 Progress towards PT goals: Progressing toward goals    Frequency    7X/week      PT Plan      Co-evaluation              AM-PAC PT "6 Clicks" Mobility   Outcome Measure  Help needed turning from your back to your side while in a flat bed without using bedrails?: A Little Help needed moving from lying on your back to sitting on the side of a flat bed without using bedrails?: A Little Help needed moving to and from a bed to a chair (including a wheelchair)?: A Little Help needed standing up from a chair using your arms (e.g., wheelchair or bedside chair)?: A Little Help needed to walk in hospital room?: A Little Help needed climbing 3-5 steps with a railing? : Total 6 Click  Score: 16    End of Session   Activity Tolerance: Other (comment) (limited by nausea/emesis) Patient left: with call bell/phone within reach;in chair;with family/visitor present Nurse Communication: Mobility status;Patient requests pain meds PT Visit Diagnosis: Other abnormalities of gait and mobility (R26.89);Pain Pain - Right/Left: Right Pain - part of body: Hip     Time: 0902-0919 PT Time Calculation (min) (ACUTE ONLY): 17 min  Charges:    $Gait Training: 8-22 mins PT General Charges $$ ACUTE PT VISIT: 1 Visit                     Teresia Myint R. PTA Acute Rehabilitation Services Office: [phone]   Catalina Antigua 05/02/2023, 10:53 AM

## 2023-05-02 NOTE — Progress Notes (Signed)
Physical Therapy Treatment Patient Details Name: Joe Garcia MRN: [id number] DOB: 06/05/1951 Today's Date: 05/02/2023   History of Present Illness Pt is 72 year old presented to [facility] on  05/01/23 for rt THR. PMH - HTN, colon CA, neuropathy, asthma/copd    PT Comments  Pt greeted up in chair on arrival and agreeable to session with continued progress towards acute goals. Pt able to progress gait distance with CGA for safety and RW for support. Pt requiring cues during gait for wider BOS and for upright trunk and increased heel strike on R with pt able to correct. Pt was educated on continued walker use to maximize functional independence, safety, and decrease risk for falls as well as appropriate activity progression. Pt performing seated LE therex with cues for technique at end of session. Pt continues to benefit from skilled PT services to progress toward functional mobility goals.     If plan is discharge home, recommend the following: A little help with walking and/or transfers;A little help with bathing/dressing/bathroom   Can travel by private vehicle        Equipment Recommendations  None recommended by PT    Recommendations for Other Services       Precautions / Restrictions Precautions Precautions: Fall Restrictions Weight Bearing Restrictions: No     Mobility  Bed Mobility Overal bed mobility: Needs Assistance Bed Mobility: Supine to Sit, Sit to Supine     Supine to sit: Contact guard     General bed mobility comments: up in recliner on arrival    Transfers Overall transfer level: Needs assistance Equipment used: Rolling walker (2 wheels) Transfers: Sit to/from Stand Sit to Stand: Contact guard assist           General transfer comment: good hand placement and steady rise    Ambulation/Gait Ambulation/Gait assistance: Contact guard assist Gait Distance (Feet): 160 Feet Assistive device: Rolling walker (2 wheels) Gait Pattern/deviations:  Step-through pattern, Decreased stride length, Decreased weight shift to right, Step-to pattern, Narrow base of support Gait velocity: decr     General Gait Details: cues for wider BOS as pt keeping feet touching initially, step-to pattern progressing to step-through pattern   Stairs             Wheelchair Mobility     Tilt Bed    Modified Rankin (Stroke Patients Only)       Balance Overall balance assessment: Mild deficits observed, not formally tested                                          Cognition Arousal: Alert Behavior During Therapy: WFL for tasks assessed/performed Overall Cognitive Status: Within Functional Limits for tasks assessed                                          Exercises Total Joint Exercises Ankle Circles/Pumps: AROM, Right, 10 reps, Seated Heel Slides: AROM, Right, 10 reps, Seated Marching in Standing: AROM, Right, 10 reps, Standing    General Comments General comments (skin integrity, edema, etc.): VSS on RA, pt with mild c/o SOB at end of ambulation and reporting asthma at baseline, RN aware      Pertinent Vitals/Pain Pain Assessment Pain Assessment: Faces Faces Pain Scale: Hurts even more Pain Location: rt hip Pain  Descriptors / Indicators: Aching, Sore Pain Intervention(s): Monitored during session, Limited activity within patient's tolerance    Home Living Family/patient expects to be discharged to:: Unsure Living Arrangements: Alone   Type of Home: Apartment Home Access: Elevator       Home Layout: One level Home Equipment: Agricultural consultant (2 wheels);Cane - single point      Prior Function            PT Goals (current goals can now be found in the care plan section) Acute Rehab PT Goals Patient Stated Goal: return home PT Goal Formulation: With patient Time For Goal Achievement: 05/08/23 Progress towards PT goals: Progressing toward goals    Frequency    7X/week       PT Plan      Co-evaluation              AM-PAC PT "6 Clicks" Mobility   Outcome Measure  Help needed turning from your back to your side while in a flat bed without using bedrails?: A Little Help needed moving from lying on your back to sitting on the side of a flat bed without using bedrails?: A Little Help needed moving to and from a bed to a chair (including a wheelchair)?: A Little Help needed standing up from a chair using your arms (e.g., wheelchair or bedside chair)?: A Little Help needed to walk in hospital room?: A Little Help needed climbing 3-5 steps with a railing? : Total 6 Click Score: 16    End of Session Equipment Utilized During Treatment: Gait belt Activity Tolerance: Patient tolerated treatment well Patient left: with call bell/phone within reach;in chair;with family/visitor present Nurse Communication: Mobility status;Other (comment) (pt reeqesting inhaler) PT Visit Diagnosis: Other abnormalities of gait and mobility (R26.89);Pain Pain - Right/Left: Right Pain - part of body: Hip     Time: 1610-9604 PT Time Calculation (min) (ACUTE ONLY): 21 min  Charges:    $Gait Training: 8-22 mins PT General Charges $$ ACUTE PT VISIT: 1 Visit                     Sumer Moorehouse R. PTA Acute Rehabilitation Services Office: [phone]   Catalina Antigua 05/02/2023, 1:44 PM

## 2023-05-03 ENCOUNTER — Other Ambulatory Visit: Payer: Self-pay

## 2023-05-03 ENCOUNTER — Encounter (HOSPITAL_COMMUNITY): Payer: Self-pay | Admitting: Orthopaedic Surgery

## 2023-05-03 DIAGNOSIS — M1611 Unilateral primary osteoarthritis, right hip: Secondary | ICD-10-CM | POA: Diagnosis not present

## 2023-05-03 NOTE — Progress Notes (Signed)
Physical Therapy Treatment Patient Details Name: Joe Garcia MRN: [id number] DOB: 01-Mar-1951 Today's Date: 05/03/2023   History of Present Illness Pt is 72 year old presented to [facility] on  05/01/23 for rt THR. PMH - HTN, colon CA, neuropathy, asthma/copd    PT Comments  Continuing work on functional mobility and activity tolerance;  session focused on therapeutic exercise for R hip strengthening, with very good participation; plan to return in the afternoon for progressive amb    If plan is discharge home, recommend the following: A little help with walking and/or transfers;A little help with bathing/dressing/bathroom   Can travel by private vehicle        Equipment Recommendations  None recommended by PT    Recommendations for Other Services Other (comment) (TOC for post-acute rehab)     Precautions / Restrictions Precautions Precautions: Fall Restrictions Weight Bearing Restrictions: No     Mobility  Bed Mobility                    Transfers Overall transfer level: Needs assistance Equipment used: Rolling walker (2 wheels) Transfers: Sit to/from Stand Sit to Stand: Contact guard assist           General transfer comment: good hand placement and steady rise    Ambulation/Gait                   Stairs             Wheelchair Mobility     Tilt Bed    Modified Rankin (Stroke Patients Only)       Balance Overall balance assessment: Mild deficits observed, not formally tested                                          Cognition Arousal: Alert Behavior During Therapy: WFL for tasks assessed/performed Overall Cognitive Status: Within Functional Limits for tasks assessed                                          Exercises Total Joint Exercises Ankle Circles/Pumps: AROM, Right, 10 reps, Seated Quad Sets: AROM, Right, 10 reps Gluteal Sets: AROM, Both, 10 reps Towel Squeeze: AROM, Both, 10  reps Short Arc Quad: AROM, Right, 10 reps Heel Slides: AROM, Right, 10 reps, Seated Hip ABduction/ADduction: AROM, Right, 10 reps Other Exercises Other Exercises: Standing open chair R hip flex, abduction, extension x 10 reps; L single limb stance with RW    General Comments        Pertinent Vitals/Pain Pain Assessment Pain Assessment: Faces Faces Pain Scale: Hurts little more Pain Location: rt hip Pain Descriptors / Indicators: Aching, Sore Pain Intervention(s): Limited activity within patient's tolerance    Home Living                          Prior Function            PT Goals (current goals can now be found in the care plan section) Acute Rehab PT Goals Patient Stated Goal: return home PT Goal Formulation: With patient Time For Goal Achievement: 05/08/23 Potential to Achieve Goals: Good Progress towards PT goals: Progressing toward goals    Frequency    7X/week  PT Plan      Co-evaluation              AM-PAC PT "6 Clicks" Mobility   Outcome Measure  Help needed turning from your back to your side while in a flat bed without using bedrails?: A Little Help needed moving from lying on your back to sitting on the side of a flat bed without using bedrails?: A Little Help needed moving to and from a bed to a chair (including a wheelchair)?: A Little Help needed standing up from a chair using your arms (e.g., wheelchair or bedside chair)?: A Little Help needed to walk in hospital room?: A Little Help needed climbing 3-5 steps with a railing? : Total 6 Click Score: 16    End of Session   Activity Tolerance: Patient tolerated treatment well Patient left: with call bell/phone within reach;in chair;with family/visitor present Nurse Communication: Mobility status PT Visit Diagnosis: Other abnormalities of gait and mobility (R26.89);Pain Pain - Right/Left: Right Pain - part of body: Hip     Time: 1158-1207 PT Time Calculation (min)  (ACUTE ONLY): 9 min  Charges:    $Therapeutic Exercise: 8-22 mins PT General Charges $$ ACUTE PT VISIT: 1 Visit                     Joe Garcia, PT  Acute Rehabilitation Services Office [phone] Secure Chat welcomed    Joe Garcia 05/03/2023, 2:08 PM

## 2023-05-03 NOTE — Progress Notes (Signed)
Patient ID: Joe Garcia, male   DOB: 1951-05-10, 72 y.o.   MRN: [id number]   Subjective: 2 Days Post-Op Procedure(s) (LRB): TOTAL HIP ARTHROPLASTY ANTERIOR APPROACH (Right) Patient reports pain as mild.  In Recliner talking on the phone.   Objective: Vital signs in last 24 hours: Temp:  [97.8 F (36.6 C)-98.2 F (36.8 C)] 98.2 F (36.8 C) (10/06 0810) Pulse Rate:  [56-70] 61 (10/06 0810) Resp:  [16-18] 18 (10/06 0810) BP: (116-144)/(53-64) 144/53 (10/06 0810) SpO2:  [95 %-99 %] 95 % (10/06 0810)  Intake/Output from previous day: 10/05 0701 - 10/06 0700 In: 240 [P.O.:240] Out: 1070 [Urine:1070] Intake/Output this shift: No intake/output data recorded.  Recent Labs    05/02/23 1351  HGB 14.2   Recent Labs    05/02/23 1351  WBC 13.1*  RBC 4.94  HCT 42.6  PLT 197   No results for input(s): "NA", "K", "CL", "CO2", "BUN", "CREATININE", "GLUCOSE", "CALCIUM" in the last 72 hours. No results for input(s): "LABPT", "INR" in the last 72 hours.  Neurologically intact No results found.  Assessment/Plan: 2 Days Post-Op Procedure(s) (LRB): TOTAL HIP ARTHROPLASTY ANTERIOR APPROACH (Right) Up with therapy Discharge to SNF when bed available. Plan was for going home until his son moved.   Eldred Manges 05/03/2023, 10:18 AM

## 2023-05-03 NOTE — Progress Notes (Signed)
Physical Therapy Treatment Patient Details Name: Joe Garcia MRN: [id number] DOB: 05-Apr-1951 Today's Date: 05/03/2023   History of Present Illness Pt is 72 year old presented to [facility] on  05/01/23 for rt THR. PMH - HTN, colon CA, neuropathy, asthma/copd    PT Comments  Continuing work on functional mobility and activity tolerance;  Session focused on progressive amb, and pt continues to need cues to increase his narrow step width; Worked on R LE stance stability, with multimodal cues to activate hip extensors in R stance and keep chest up, shoulders back; Overall progressing well; Anticipate continuing good progress at post-acute rehabilitation.    If plan is discharge home, recommend the following: A little help with walking and/or transfers;A little help with bathing/dressing/bathroom   Can travel by private vehicle        Equipment Recommendations  None recommended by PT    Recommendations for Other Services Other (comment) (TOC for post-acute rehab)     Precautions / Restrictions Precautions Precautions: Fall Restrictions Weight Bearing Restrictions: No     Mobility  Bed Mobility Overal bed mobility: Needs Assistance Bed Mobility: Supine to Sit, Sit to Supine     Supine to sit: Contact guard Sit to supine: Contact guard assist   General bed mobility comments: Smooth motion getting up    Transfers Overall transfer level: Needs assistance Equipment used: Rolling walker (2 wheels) Transfers: Sit to/from Stand Sit to Stand: Contact guard assist           General transfer comment: good hand placement and steady rise    Ambulation/Gait Ambulation/Gait assistance: Contact guard assist, Min assist Gait Distance (Feet): 120 Feet Assistive device: Rolling walker (2 wheels) Gait Pattern/deviations: Step-through pattern, Decreased stride length, Decreased weight shift to right, Step-to pattern, Narrow base of support Gait velocity: decr     General Gait  Details: Min assist to manage RW, helping pt go from "inch-worm" type RW progression to smooth progression, allowing for longer steps; cues for wider BOS as pt keeping feet touching initially, step-to pattern progressing to step-through pattern; verbal adn tactile cues to stand tall on RLE in stance, and activate hip extensors more   Stairs             Wheelchair Mobility     Tilt Bed    Modified Rankin (Stroke Patients Only)       Balance Overall balance assessment: Mild deficits observed, not formally tested                                          Cognition Arousal: Alert Behavior During Therapy: WFL for tasks assessed/performed Overall Cognitive Status: Within Functional Limits for tasks assessed                                          Exercises Total Joint Exercises Ankle Circles/Pumps: AROM, Right, 10 reps, Seated Quad Sets: AROM, Right, 10 reps Gluteal Sets: AROM, Both, 10 reps Towel Squeeze: AROM, Both, 10 reps Short Arc Quad: AROM, Right, 10 reps Heel Slides: AROM, Right, 10 reps, Seated Hip ABduction/ADduction: AROM, Right, 10 reps Other Exercises Other Exercises: Standing open chair R hip flex, abduction, extension x 10 reps; L single limb stance with RW    General Comments General comments (skin integrity, edema, etc.):  No SOB noted      Pertinent Vitals/Pain Pain Assessment Pain Assessment: Faces Faces Pain Scale: Hurts little more Pain Location: rt hip Pain Descriptors / Indicators: Aching, Sore Pain Intervention(s): Monitored during session    Home Living                          Prior Function            PT Goals (current goals can now be found in the care plan section) Acute Rehab PT Goals Patient Stated Goal: return home PT Goal Formulation: With patient Time For Goal Achievement: 05/08/23 Potential to Achieve Goals: Good Progress towards PT goals: Progressing toward goals     Frequency    7X/week      PT Plan      Co-evaluation              AM-PAC PT "6 Clicks" Mobility   Outcome Measure  Help needed turning from your back to your side while in a flat bed without using bedrails?: A Little Help needed moving from lying on your back to sitting on the side of a flat bed without using bedrails?: A Little Help needed moving to and from a bed to a chair (including a wheelchair)?: A Little Help needed standing up from a chair using your arms (e.g., wheelchair or bedside chair)?: A Little Help needed to walk in hospital room?: A Little Help needed climbing 3-5 steps with a railing? : Total 6 Click Score: 16    End of Session Equipment Utilized During Treatment: Gait belt Activity Tolerance: Patient tolerated treatment well Patient left: in bed;with call bell/phone within reach Nurse Communication: Mobility status PT Visit Diagnosis: Other abnormalities of gait and mobility (R26.89);Pain Pain - Right/Left: Right Pain - part of body: Hip     Time: 4696-2952 PT Time Calculation (min) (ACUTE ONLY): 9 min  Charges:    $Gait Training: 8-22 mins $Therapeutic Exercise: 8-22 mins PT General Charges $$ ACUTE PT VISIT: 1 Visit                     Van Clines, PT  Acute Rehabilitation Services Office [phone] Secure Chat welcomed    Joe Garcia 05/03/2023, 5:09 PM

## 2023-05-03 NOTE — Progress Notes (Signed)
Mobility Specialist: Progress Note   05/03/23 1221  Mobility  Activity Ambulated with assistance in hallway  Level of Assistance Standby assist, set-up cues, supervision of patient - no hands on  Assistive Device Front wheel walker  Distance Ambulated (ft) 300 ft  Activity Response Tolerated well  Mobility Referral Yes  $Mobility charge 1 Mobility  Mobility Specialist Start Time (ACUTE ONLY) 1126  Mobility Specialist Stop Time (ACUTE ONLY) 1137  Mobility Specialist Time Calculation (min) (ACUTE ONLY) 11 min    Pt was agreeable to mobility session - received in chair. Stated he had pain but it was tolerable as he is used to it. Returned to room without fault. Left in chair with all needs met, call bell in reach.   Maurene Capes Mobility Specialist Please contact via SecureChat or Rehab office at [phone]

## 2023-05-03 NOTE — Plan of Care (Signed)

## 2023-05-04 ENCOUNTER — Encounter (HOSPITAL_COMMUNITY): Payer: Self-pay | Admitting: Orthopaedic Surgery

## 2023-05-04 DIAGNOSIS — M1611 Unilateral primary osteoarthritis, right hip: Secondary | ICD-10-CM | POA: Diagnosis not present

## 2023-05-04 MED ORDER — RIVAROXABAN 10 MG PO TABS: 10.0000 mg | ORAL_TABLET | Freq: Every day | ORAL | 0 refills | Status: DC

## 2023-05-04 MED ORDER — OXYCODONE-ACETAMINOPHEN 5-325 MG PO TABS
1.0000 | ORAL_TABLET | Freq: Four times a day (QID) | ORAL | 0 refills | Status: DC | PRN
Start: 2023-05-04 — End: 2023-06-23

## 2023-05-04 NOTE — [place] FL2 (Signed)
[place] MEDICAID FL2 LEVEL OF CARE FORM     IDENTIFICATION  Patient Name: Joe Garcia Birthdate: Sep 28, 1950 Sex: male Admission Date (Current Location): 05/01/2023  [place] and [place] Number:  Haynes Bast [id number] M Facility and Address:  [facility] [place], [address], [place], [place] [address]      Provider Number: [id number]  Attending Physician Name and Address:  Tarry Kos, MD  Relative Name and Phone Number:  Marchia Bond Daughter [phone]    Current Level of Care: Hospital Recommended Level of Care: Skilled Nursing Facility Prior Approval Number:    Date Approved/Denied:   PASRR Number: [id number]  Discharge Plan: SNF    Current Diagnoses: Patient Active Problem List   Diagnosis Date Noted   Postoperative urinary retention 05/02/2023   Status post total replacement of right hip 05/01/2023   Primary osteoarthritis of right hip 04/30/2023   Hyperlipidemia 08/05/2022   Genetic testing 09/07/2020   Acute-on-chronic kidney injury (HCC) 02/12/2020   Sepsis (HCC) 02/12/2020   Pneumonia 02/12/2020   Multifocal pneumonia 02/11/2020   Port-A-Cath in place 08/04/2019   Chronic systolic heart failure (HCC)    Colonic obstruction (HCC)    Cancer of transverse colon s/p partial colectomy 06/30/2019    Nausea & vomiting 06/26/2019   Colonic mass 06/26/2019   COPD GOLD ?  11/24/2018   Chest pain    NSTEMI (non-ST elevated myocardial infarction) (HCC) 10/03/2018   COPD with acute exacerbation (HCC) 07/26/2018   ARF (acute renal failure) (HCC) 07/26/2018   Nausea and vomiting 07/26/2018   Weight loss, unintentional 07/26/2018   Malnutrition of moderate degree 07/16/2017   CKD (chronic kidney disease), stage II 07/14/2017   Solitary kidney, congenital 06/23/2017   Nonischemic cardiomyopathy (HCC)    Multifocal PVCs    Chronic combined systolic (congestive) and diastolic (congestive) heart failure (HCC) 06/11/2017   Hypokalemia  05/05/2017   DOE (dyspnea on exertion) 07/16/2013   Alcohol abuse 09/25/2012   Alcohol dependence (HCC) 09/22/2012   Essential hypertension 03/22/2007   DEGENERATIVE DISC DISEASE 03/22/2007   AVASCULAR NECROSIS 03/22/2007    Orientation RESPIRATION BLADDER Height & Weight     Self, Time, Situation, Place  Normal Continent Weight: 160 lb (72.6 kg) Height:  5\' 11"  (180.3 cm)  BEHAVIORAL SYMPTOMS/MOOD NEUROLOGICAL BOWEL NUTRITION STATUS      Continent Diet (see discharge summary)  AMBULATORY STATUS COMMUNICATION OF NEEDS Skin   Limited Assist Verbally Surgical wounds                       Personal Care Assistance Level of Assistance  Bathing, Feeding, Dressing Bathing Assistance: Limited assistance Feeding assistance: Independent Dressing Assistance: Limited assistance     Functional Limitations Info  Sight, Hearing, Speech Sight Info: Adequate Hearing Info: Adequate Speech Info: Adequate    SPECIAL CARE FACTORS FREQUENCY  PT (By licensed PT), OT (By licensed OT)     PT Frequency: 5x week OT Frequency: 5x week            Contractures Contractures Info: Not present    Additional Factors Info  Code Status, Allergies Code Status Info: full Allergies Info: Clarithromycin, Lisinopril           Current Medications (05/04/2023):  This is the current hospital active medication list Current Facility-Administered Medications  Medication Dose Route Frequency Provider Last Rate Last Admin   acetaminophen (TYLENOL) tablet 325-650 mg  325-650 mg Oral Q6H PRN Tarry Kos, MD   [phone]  mg at 05/03/23 1717   albuterol (PROVENTIL) (2.5 MG/3ML) 0.083% nebulizer solution 2.5 mg  2.5 mg Nebulization Q4H PRN Tarry Kos, MD   2.5 mg at 05/03/23 1325   alum & mag hydroxide-simeth (MAALOX/MYLANTA) 200-200-20 MG/5ML suspension 30 mL  30 mL Oral Q4H PRN Tarry Kos, MD       amiodarone (PACERONE) tablet 100 mg  100 mg Oral Daily Tarry Kos, MD   100 mg at 05/03/23 0949    amLODipine (NORVASC) tablet 5 mg  5 mg Oral Daily Tarry Kos, MD   5 mg at 05/03/23 0948   carvedilol (COREG) tablet 3.125 mg  3.125 mg Oral BID WC Tarry Kos, MD   3.125 mg at 05/03/23 1716   diphenhydrAMINE (BENADRYL) 12.5 MG/5ML elixir 25 mg  25 mg Oral Q4H PRN Tarry Kos, MD       docusate sodium (COLACE) capsule 100 mg  100 mg Oral BID Tarry Kos, MD   100 mg at 05/03/23 2128   ferrous sulfate tablet 325 mg  325 mg Oral TID PC Tarry Kos, MD   325 mg at 05/03/23 1716   HYDROmorphone (DILAUDID) injection 0.5-1 mg  0.5-1 mg Intravenous Q4H PRN Tarry Kos, MD   1 mg at 05/01/23 1619   losartan (COZAAR) tablet 50 mg  50 mg Oral Daily Tarry Kos, MD   50 mg at 05/03/23 1610   menthol-cetylpyridinium (CEPACOL) lozenge 3 mg  1 lozenge Oral PRN Tarry Kos, MD       Or   phenol (CHLORASEPTIC) mouth spray 1 spray  1 spray Mouth/Throat PRN Tarry Kos, MD       methocarbamol (ROBAXIN) tablet 500 mg  500 mg Oral Q6H PRN Tarry Kos, MD   500 mg at 05/01/23 1514   Or   methocarbamol (ROBAXIN) 500 mg in dextrose 5 % 50 mL IVPB  500 mg Intravenous Q6H PRN Tarry Kos, MD       metoCLOPramide (REGLAN) tablet 5-10 mg  5-10 mg Oral Q8H PRN Tarry Kos, MD       Or   metoCLOPramide (REGLAN) injection 5-10 mg  5-10 mg Intravenous Q8H PRN Tarry Kos, MD       ondansetron [facility]) tablet 4 mg  4 mg Oral Q6H PRN Tarry Kos, MD       Or   ondansetron [facility]) injection 4 mg  4 mg Intravenous Q6H PRN Tarry Kos, MD       oxyCODONE (Oxy IR/ROXICODONE) immediate release tablet 10-15 mg  10-15 mg Oral Q4H PRN Tarry Kos, MD   10 mg at 05/01/23 1514   oxyCODONE (Oxy IR/ROXICODONE) immediate release tablet 5-10 mg  5-10 mg Oral Q4H PRN Tarry Kos, MD       oxyCODONE (OXYCONTIN) 12 hr tablet 10 mg  10 mg Oral Q12H Tarry Kos, MD   10 mg at 05/03/23 2128   pantoprazole (PROTONIX) EC tablet 40 mg  40 mg Oral Daily Tarry Kos, MD   40 mg at 05/03/23 0948   polyethylene  glycol (MIRALAX / GLYCOLAX) packet 17 g  17 g Oral Daily Tarry Kos, MD   17 g at 05/04/23 1008   rivaroxaban (XARELTO) tablet 10 mg  10 mg Oral Q breakfast Tarry Kos, MD   10 mg at 05/03/23 0948   sorbitol 70 % solution 30 mL  30 mL Oral Daily PRN  Tarry Kos, MD       tamsulosin [facility]) capsule 0.4 mg  0.4 mg Oral Daily Eldred Manges, MD   0.4 mg at 05/03/23 1610     Discharge Medications: Please see discharge summary for a list of discharge medications.  Relevant Imaging Results:  Relevant Lab Results:   Additional Information SSN: [id number]  Lorri Frederick, LCSW

## 2023-05-04 NOTE — Progress Notes (Signed)
Discharged to [facility] as prescribed.  Pt left unit in wheelchair accompanied by his son [place].  Pt AO x 4.  No distress noted/reported.  Daytime RN provided detailed education & instructions on how to care for self.  S/S to report or to return to PCP/hospital for such as Pain, swelling to Rt hip or difficulty urinating, etc.  Understanding verbalized.

## 2023-05-04 NOTE — Progress Notes (Signed)
Physical Therapy Treatment Patient Details Name: Joe Garcia MRN: [id number] DOB: April 12, 1951 Today's Date: 05/04/2023   History of Present Illness Pt is 72 year old presented to [facility] on  05/01/23 for rt THR. PMH - HTN, colon CA, neuropathy, asthma/copd    PT Comments  Pt continues to require assist and cues for safe mobility. No home support. Session time limited due to pt preparing to transfer to SNF. Needs to continue to work on independence and safety and a more normal gait pattern.     If plan is discharge home, recommend the following: A little help with walking and/or transfers;A little help with bathing/dressing/bathroom;Assist for transportation;Help with stairs or ramp for entrance   Can travel by private vehicle        Equipment Recommendations  None recommended by PT    Recommendations for Other Services       Precautions / Restrictions Precautions Precautions: Fall Restrictions Weight Bearing Restrictions: No     Mobility  Bed Mobility Overal bed mobility: Needs Assistance Bed Mobility: Supine to Sit     Supine to sit: Supervision     General bed mobility comments: Incr time    Transfers Overall transfer level: Needs assistance Equipment used: Rolling walker (2 wheels) Transfers: Sit to/from Stand Sit to Stand: Contact guard assist           General transfer comment: Assist for safety    Ambulation/Gait Ambulation/Gait assistance: Contact guard assist, Min assist Gait Distance (Feet): 150 Feet Assistive device: Rolling walker (2 wheels) Gait Pattern/deviations: Step-through pattern, Decreased stride length, Decreased weight shift to right, Step-to pattern, Narrow base of support Gait velocity: decr Gait velocity interpretation: 1.31 - 2.62 ft/sec, indicative of limited community ambulator   General Gait Details: Assist for balance when pt stepping past front of walker. Verbal cues to keep toes pointing forward, widen base and not step too  far forward.   Stairs             Wheelchair Mobility     Tilt Bed    Modified Rankin (Stroke Patients Only)       Balance Overall balance assessment: Mild deficits observed, not formally tested                                          Cognition Arousal: Alert Behavior During Therapy: WFL for tasks assessed/performed Overall Cognitive Status: Within Functional Limits for tasks assessed                                          Exercises      General Comments        Pertinent Vitals/Pain Pain Assessment Pain Assessment: Faces Faces Pain Scale: Hurts little more Pain Location: rt hip Pain Descriptors / Indicators: Aching, Sore Pain Intervention(s): Limited activity within patient's tolerance, Monitored during session    Home Living                          Prior Function            PT Goals (current goals can now be found in the care plan section) Progress towards PT goals: Progressing toward goals    Frequency    7X/week      PT  Plan      Co-evaluation              AM-PAC PT "6 Clicks" Mobility   Outcome Measure  Help needed turning from your back to your side while in a flat bed without using bedrails?: A Little Help needed moving from lying on your back to sitting on the side of a flat bed without using bedrails?: A Little Help needed moving to and from a bed to a chair (including a wheelchair)?: A Little Help needed standing up from a chair using your arms (e.g., wheelchair or bedside chair)?: A Little Help needed to walk in hospital room?: A Little Help needed climbing 3-5 steps with a railing? : Total 6 Click Score: 16    End of Session   Activity Tolerance: Patient tolerated treatment well Patient left: in bed;with call bell/phone within reach (sitting EOB with transporter in room)   PT Visit Diagnosis: Other abnormalities of gait and mobility (R26.89);Pain Pain - Right/Left:  Right Pain - part of body: Hip     Time: 1610-9604 PT Time Calculation (min) (ACUTE ONLY): 10 min  Charges:    $Gait Training: 8-22 mins PT General Charges $$ ACUTE PT VISIT: 1 Visit                     [facility] PT Acute Rehabilitation Services Office [phone]    Angelina Ok [facility] 05/04/2023, 4:51 PM

## 2023-05-04 NOTE — TOC Initial Note (Addendum)
Transition of Care [facility]) - Initial/Assessment Note    Patient Details  Name: Joe Garcia MRN: [id number] Date of Birth: [birth year]  [facility] CM/SW Contact:    Lorri Frederick, LCSW Phone Number: 05/04/2023, 10:14 AM  Clinical Narrative:    CSW met with pt regarding DC recommendation for SNF.  Pt is agreeable to SNF, first choice would [facility], permission given to send referral out on hub.  Permission given to speak with daughters Erskine Speed, and son Haydee Salter.  Pt from home alone, no current services.  CSW spoke with daughter Asher Muir and she is in agreement with plan for SNF at [facility] if possible.  Referral sent to [facility], CSW reached out to [place] to review.                1145: [facility] does extend bed offer.  Per Holly/PT, pt does not require ambulance transport.   SNF auth request submitted in [place].   [facility] [place] approved: [id number],  3 days: 10/7-10/9.  CSW confirmed with Nelma Rothman that [facility] can receive today.  CSW spoke with Asher Muir, she called and brother Haydee Salter will transport and do paperwork.    Expected Discharge Plan: Skilled Nursing Facility Barriers to Discharge: SNF Pending bed offer   Patient Goals and CMS Choice Patient states their goals for this hospitalization and ongoing recovery are:: be able to go places   Choice offered to / list presented to : Patient (requesting [facility])      Expected Discharge Plan and Services In-house Referral: Clinical Social Work   Post Acute Care Choice: Skilled Nursing Facility Living arrangements for the past 2 months: Apartment Expected Discharge Date: 05/04/23                                    Prior Living Arrangements/Services Living arrangements for the past 2 months: Apartment Lives with:: Self Patient language and need for interpreter reviewed:: Yes Do you feel safe going back to the place where you live?: Yes      Need for Family Participation in Patient Care: Yes  (Comment) Care giver support system in place?: Yes (comment) Current home services: Other (comment) (none) Criminal Activity/Legal Involvement Pertinent to Current Situation/Hospitalization: No - Comment as needed  Activities of Daily Living   ADL Screening (condition at time of admission) Independently performs ADLs?: No Does the patient have a NEW difficulty with bathing/dressing/toileting/self-feeding that is expected to last >3 days?: Yes (Initiates electronic notice to provider for possible OT consult) Does the patient have a NEW difficulty with getting in/out of bed, walking, or climbing stairs that is expected to last >3 days?: Yes (Initiates electronic notice to provider for possible PT consult) Does the patient have a NEW difficulty with communication that is expected to last >3 days?: No Is the patient deaf or have difficulty hearing?: No Does the patient have difficulty seeing, even when wearing glasses/contacts?: No Does the patient have difficulty concentrating, remembering, or making decisions?: No  Permission Sought/Granted Permission sought to share information with : Family Supports Permission granted to share information with : Yes, Verbal Permission Granted  Share Information with NAME: daughters Erskine Speed, son Haydee Salter  Permission granted to share info w AGENCY: SNF        Emotional Assessment Appearance:: Appears stated age Attitude/Demeanor/Rapport: Engaged Affect (typically observed): Appropriate, Pleasant Orientation: : Oriented to Self, Oriented to Place, Oriented to  Time, Oriented to Situation  Admission diagnosis:  Status post total replacement of right hip [Z96.641] Patient Active Problem List   Diagnosis Date Noted   Postoperative urinary retention 05/02/2023   Status post total replacement of right hip 05/01/2023   Primary osteoarthritis of right hip 04/30/2023   Hyperlipidemia 08/05/2022   Genetic testing 09/07/2020   Acute-on-chronic  kidney injury (HCC) 02/12/2020   Sepsis (HCC) 02/12/2020   Pneumonia 02/12/2020   Multifocal pneumonia 02/11/2020   Port-A-Cath in place 08/04/2019   Chronic systolic heart failure (HCC)    Colonic obstruction (HCC)    Cancer of transverse colon s/p partial colectomy 06/30/2019    Nausea & vomiting 06/26/2019   Colonic mass 06/26/2019   COPD GOLD ?  11/24/2018   Chest pain    NSTEMI (non-ST elevated myocardial infarction) (HCC) 10/03/2018   COPD with acute exacerbation (HCC) 07/26/2018   ARF (acute renal failure) (HCC) 07/26/2018   Nausea and vomiting 07/26/2018   Weight loss, unintentional 07/26/2018   Malnutrition of moderate degree 07/16/2017   CKD (chronic kidney disease), stage II 07/14/2017   Solitary kidney, congenital 06/23/2017   Nonischemic cardiomyopathy (HCC)    Multifocal PVCs    Chronic combined systolic (congestive) and diastolic (congestive) heart failure (HCC) 06/11/2017   Hypokalemia 05/05/2017   DOE (dyspnea on exertion) 07/16/2013   Alcohol abuse 09/25/2012   Alcohol dependence (HCC) 09/22/2012   Essential hypertension 03/22/2007   DEGENERATIVE DISC DISEASE 03/22/2007   AVASCULAR NECROSIS 03/22/2007   PCP:  Hillery Aldo, NP Pharmacy:   [facility] STORE - [place], [place] - [address] [place] [address] Phone: [phone] Fax: [phone]  [facility] - [place], [place] - [address] [place] [address] Phone: [phone] Fax: [phone]     Social Determinants of Health (SDOH) Social History: SDOH Screenings   Food Insecurity: Not on File (04/21/2022)   Received from [employer] Needs: Not on File (04/21/2022)   Received from [employer] Strain: Not on File (04/21/2022)   Received from [facility]  Physical Activity: Not on File (11/14/2021)   Received from [place], [place]  Social Connections: Not on File (04/06/2023)   Received from [facility]  Stress: Not on  File (11/14/2021)   Received from [place], [place]  Tobacco Use: Medium Risk (05/03/2023)   SDOH Interventions:     Readmission Risk Interventions     No data to display

## 2023-05-04 NOTE — Progress Notes (Signed)
Mobility Specialist: Progress Note   05/04/23 1549  Mobility  Activity Ambulated with assistance in hallway  Level of Assistance Standby assist, set-up cues, supervision of patient - no hands on  Assistive Device Front wheel walker  Distance Ambulated (ft) 400 ft  Activity Response Tolerated well  Mobility Referral Yes  $Mobility charge 1 Mobility  Mobility Specialist Start Time (ACUTE ONLY) 1240  Mobility Specialist Stop Time (ACUTE ONLY) 1250  Mobility Specialist Time Calculation (min) (ACUTE ONLY) 10 min    Pt was agreeable to mobility session - received in bed. Observed pt ambulating with narrow BOS, but pt stated that was his baseline. Had c/o pain rated 6/10 but tolerable. Returned to room without fault. Left in bed with all needs met, call bell in reach.   Maurene Capes Mobility Specialist Please contact via SecureChat or Rehab office at [phone]

## 2023-05-04 NOTE — Progress Notes (Signed)
Report given to Erica.

## 2023-05-04 NOTE — Discharge Summary (Signed)
Patient ID: Joe Garcia MRN: [id number] DOB/AGE: 09-22-1950 72 y.o.  Admit date: 05/01/2023 Discharge date: 05/04/2023  Admission Diagnoses:  Principal Problem:   Primary osteoarthritis of right hip Active Problems:   Status post total replacement of right hip   Postoperative urinary retention   Discharge Diagnoses:  Same  Past Medical History:  Diagnosis Date   Alcoholism (HCC)    hx- hasn't drank since 2014   Allergy    Arthritis    hips, L shoulder   Asthma    Cancer (HCC) 05/2019   colon cancer   CHF (congestive heart failure) (HCC)    Chronic kidney disease    only has one kidney   COPD (chronic obstructive pulmonary disease) (HCC)    COVID-19    Depression    pt denies   Dyspnea    Dysrhythmia    PVC's   GERD (gastroesophageal reflux disease)    Heart murmur    Hyperlipidemia    Hypertension    Myocardial infarction [facility])    pt is unsure of date- believes early 2020   Osteoporosis    Pneumonia    Substance abuse (HCC)    alcoholism over 10 yrs ago    Surgeries: Procedure(s): TOTAL HIP ARTHROPLASTY ANTERIOR APPROACH on 05/01/2023   Consultants:   Discharged Condition: Improved  Hospital Course: Joe Garcia is an 72 y.o. male who was admitted 05/01/2023 for operative treatment ofPrimary osteoarthritis of right hip. Patient has severe unremitting pain that affects sleep, daily activities, and work/hobbies. After pre-op clearance the patient was taken to the operating room on 05/01/2023 and underwent  Procedure(s): TOTAL HIP ARTHROPLASTY ANTERIOR APPROACH.    Patient was given perioperative antibiotics:  Anti-infectives (From admission, onward)    Start     Dose/Rate Route Frequency Ordered Stop   05/01/23 1630  ceFAZolin (ANCEF) IVPB 2g/100 mL premix        2 g 200 mL/hr over 30 Minutes Intravenous Every 6 hours 05/01/23 1431 05/01/23 2220   05/01/23 1057  vancomycin (VANCOCIN) powder  Status:  Discontinued          As needed 05/01/23 1057  05/01/23 1204   05/01/23 0815  ceFAZolin (ANCEF) IVPB 2g/100 mL premix        2 g 200 mL/hr over 30 Minutes Intravenous On call to O.R. 05/01/23 0809 05/01/23 1030   05/01/23 0813  ceFAZolin (ANCEF) 2-4 GM/100ML-% IVPB       Note to Pharmacy: Gleason, Ginger E: cabinet override      05/01/23 0813 05/01/23 1040        Patient was given sequential compression devices, early ambulation, and chemoprophylaxis to prevent DVT.  Patient benefited maximally from hospital stay and there were no complications.    Recent vital signs: Patient Vitals for the past 24 hrs:  BP Temp Temp src Pulse Resp SpO2  05/04/23 0807 130/63 98.4 F (36.9 C) Oral 66 16 95 %  05/03/23 1947 (!) 134/58 98.5 F (36.9 C) Oral (!) 58 17 95 %  05/03/23 1556 (!) 143/62 98.3 F (36.8 C) -- 67 18 98 %     Recent laboratory studies:  Recent Labs    05/02/23 1351  WBC 13.1*  HGB 14.2  HCT 42.6  PLT 197     Discharge Medications:   Allergies as of 05/04/2023       Reactions   Clarithromycin Shortness Of Breath   Lisinopril Swelling   Facial and tongue swelling 10/2012  Medication List     TAKE these medications    albuterol 108 (90 Base) MCG/ACT inhaler Commonly known as: VENTOLIN HFA Inhale 2 puffs into the lungs every 4 (four) hours as needed for wheezing or shortness of breath.   amiodarone 200 MG tablet Commonly known as: PACERONE TAKE 1/2 TABLET (100 MG TOTAL) BY MOUTH DAILY(AM)   amLODipine 5 MG tablet Commonly known as: NORVASC TAKE 1 TABLET (5 MG TOTAL) BY MOUTH DAILY (AM)   atorvastatin 10 MG tablet Commonly known as: LIPITOR Take 10 mg by mouth at bedtime.   budesonide-formoterol 160-4.5 MCG/ACT inhaler Commonly known as: Symbicort Inhale 2 puffs into the lungs 2 (two) times daily.   carvedilol 3.125 MG tablet Commonly known as: COREG TAKE 1 TABLET (3.125 MG TOTAL) BY MOUTH 2 (TWO) TIMES DAILY WITH A MEAL. (AM+BEDTIME)   docusate sodium 100 MG capsule Commonly known  as: Colace Take 1 capsule (100 mg total) by mouth daily as needed.   fluticasone 50 MCG/ACT nasal spray Commonly known as: FLONASE Place 2 sprays into both nostrils daily as needed for allergies or rhinitis.   furosemide 20 MG tablet Commonly known as: LASIX TAKE 1 TABLET (20 MG TOTAL) BY MOUTH DAILY (AM)   ipratropium-albuterol 0.5-2.5 (3) MG/3ML Soln Commonly known as: DUONEB Take 3 mLs by nebulization every 4 (four) hours as needed.   isosorbide-hydrALAZINE 20-37.5 MG tablet Commonly known as: BIDIL TAKE 1 TABLET BY MOUTH 3 (THREE) TIMES DAILY (AM+NOON+BEDTIME)   losartan 50 MG tablet Commonly known as: COZAAR TAKE 1 TABLET (50 MG TOTAL) BY MOUTH DAILY (AM)   methocarbamol 750 MG tablet Commonly known as: Robaxin-750 Take 1 tablet (750 mg total) by mouth 2 (two) times daily as needed for muscle spasms.   montelukast 10 MG tablet Commonly known as: SINGULAIR Take 10 mg by mouth at bedtime.   ondansetron 4 MG tablet Commonly known as: Zofran Take 1 tablet (4 mg total) by mouth every 8 (eight) hours as needed for nausea or vomiting.   oxyCODONE-acetaminophen 5-325 MG tablet Commonly known as: Percocet Take 1-2 tablets by mouth every 6 (six) hours as needed. To be taken after surgery   rivaroxaban 10 MG Tabs tablet Commonly known as: XARELTO Take 1 tablet (10 mg total) by mouth daily. To be taken after surgery to prevent blood clots   Trelegy Ellipta 100-62.5-25 MCG/ACT Aepb Generic drug: Fluticasone-Umeclidin-Vilant Inhale 1 puff into the lungs daily.               Durable Medical Equipment  (From admission, onward)           Start     Ordered   05/01/23 1432  DME Walker rolling  Once       Question:  Patient needs a walker to treat with the following condition  Answer:  History of hip replacement   05/01/23 1431   05/01/23 1432  DME 3 n 1  Once        05/01/23 1431   05/01/23 1432  DME Bedside commode  Once       Question:  Patient needs a bedside  commode to treat with the following condition  Answer:  History of hip replacement   05/01/23 1431            Diagnostic Studies: DG Pelvis Portable  Result Date: 05/01/2023 CLINICAL DATA:  Status post right hip arthroplasty. EXAM: PORTABLE PELVIS 1-2 VIEWS COMPARISON:  None Available. FINDINGS: Right hip arthroplasty in expected alignment. No periprosthetic lucency  or fracture. Recent postsurgical change includes air and edema in the soft tissues. Chronic left hip arthropathy. IMPRESSION: Right hip arthroplasty without immediate postoperative complication. Electronically Signed   By: Narda Rutherford M.D.   On: 05/01/2023 13:00   DG HIP UNILAT WITH PELVIS 1V RIGHT  Result Date: 05/01/2023 CLINICAL DATA:  Elective surgery. EXAM: DG HIP (WITH OR WITHOUT PELVIS) 1V RIGHT COMPARISON:  None Available. FINDINGS: Eleven fluoroscopic spot views of the pelvis and right hip obtained in the operating room. Sequential images during hip arthroplasty. Fluoroscopy time 25.8 seconds. Dose 2.7 mGy. IMPRESSION: Intraoperative fluoroscopy for right hip arthroplasty. Electronically Signed   By: Narda Rutherford M.D.   On: 05/01/2023 12:59   DG C-Arm 1-60 Min-No Report  Result Date: 05/01/2023 Fluoroscopy was utilized by the requesting physician.  No radiographic interpretation.   DG C-Arm 1-60 Min-No Report  Result Date: 05/01/2023 Fluoroscopy was utilized by the requesting physician.  No radiographic interpretation.    Disposition: Discharge disposition: 03-Skilled Nursing Facility          Follow-up Information     Cristie Hem, [place]. Schedule an appointment as soon as possible for a visit in 2 week(s).   Specialty: Orthopedic Surgery Contact information: [address] [place] [address] [phone]                  Signed: Cristie Hem 05/04/2023, 9:20 AM

## 2023-05-04 NOTE — Progress Notes (Signed)
Occupational Therapy Treatment Patient Details Name: Joe Garcia MRN: [id number] DOB: [birth year] Today's Date: 05/04/2023   History of present illness Pt is 72 year old presented to [facility] on  05/01/23 for rt THR. PMH - HTN, colon CA, neuropathy, asthma/copd   OT comments  Pt progressing well in OT.  Completing transfers and mobility using RW with min guard, ADLs with min guard to supervision assist.  Intermittent cueing for safety and fall prevention.  Intermittent rest breaks as needed.  Will follow acutely and follow d/c recommendations per surgeon.       If plan is discharge home, recommend the following:  A little help with bathing/dressing/bathroom;A little help with walking and/or transfers;Assistance with cooking/housework;Assist for transportation   Equipment Recommendations  Tub/shower bench;Other (comment) (LB AE as needed)    Recommendations for Other Services      Precautions / Restrictions Precautions Precautions: Fall Restrictions Weight Bearing Restrictions: No       Mobility Bed Mobility Overal bed mobility: Needs Assistance Bed Mobility: Supine to Sit, Sit to Supine     Supine to sit: Supervision Sit to supine: Supervision        Transfers Overall transfer level: Needs assistance Equipment used: Rolling walker (2 wheels) Transfers: Sit to/from Stand Sit to Stand: Contact guard assist           General transfer comment: good hand placement and steady rise     Balance Overall balance assessment: Mild deficits observed, not formally tested                                         ADL either performed or assessed with clinical judgement   ADL Overall ADL's : Needs assistance/impaired     Grooming: Wash/dry hands;Supervision/safety;Standing           Upper Body Dressing : Set up;Sitting   Lower Body Dressing: Contact guard assist;Sit to/from stand   Toilet Transfer: Contact guard assist;Ambulation;Rolling walker (2  wheels)           Functional mobility during ADLs: Contact guard assist;Rolling walker (2 wheels);Cueing for safety General ADL Comments: pt educated on safety and ADL compensatory techniques    Extremity/Trunk Assessment              Vision       Perception     Praxis      Cognition Arousal: Alert Behavior During Therapy: WFL for tasks assessed/performed Overall Cognitive Status: Within Functional Limits for tasks assessed                                          Exercises      Shoulder Instructions       General Comments      Pertinent Vitals/ Pain       Pain Assessment Pain Assessment: Faces Faces Pain Scale: Hurts a little bit Pain Location: rt hip Pain Descriptors / Indicators: Aching, Sore Pain Intervention(s): Limited activity within patient's tolerance, Monitored during session, Repositioned  Home Living                                          Prior Functioning/Environment  Frequency  Min 1X/week        Progress Toward Goals  OT Goals(current goals can now be found in the care plan section)  Progress towards OT goals: Progressing toward goals  Acute Rehab OT Goals Patient Stated Goal: get better OT Goal Formulation: With patient Time For Goal Achievement: 05/16/23 Potential to Achieve Goals: Good  Plan      Co-evaluation                 AM-PAC OT "6 Clicks" Daily Activity     Outcome Measure   Help from another person eating meals?: None Help from another person taking care of personal grooming?: A Little Help from another person toileting, which includes using toliet, bedpan, or urinal?: A Little Help from another person bathing (including washing, rinsing, drying)?: A Little Help from another person to put on and taking off regular upper body clothing?: A Little Help from another person to put on and taking off regular lower body clothing?: A Little 6 Click Score:  19    End of Session Equipment Utilized During Treatment: Rolling walker (2 wheels)  OT Visit Diagnosis: Other abnormalities of gait and mobility (R26.89);Pain Pain - Right/Left: Right Pain - part of body: Hip   Activity Tolerance Patient tolerated treatment well   Patient Left in bed;with call bell/phone within reach   Nurse Communication Mobility status        Time: 4401-0272 OT Time Calculation (min): 16 min  Charges: OT General Charges $OT Visit: 1 Visit OT Treatments $Self Care/Home Management : 8-22 mins  Joe Garcia, OT Acute Rehabilitation Services Office [phone]   Chancy Milroy 05/04/2023, 1:00 PM

## 2023-05-04 NOTE — Progress Notes (Signed)
Subjective: 3 Days Post-Op Procedure(s) (LRB): TOTAL HIP ARTHROPLASTY ANTERIOR APPROACH (Right) Patient reports pain as mild.    Objective: Vital signs in last 24 hours: Temp:  [98.3 F (36.8 C)-98.5 F (36.9 C)] 98.4 F (36.9 C) (10/07 0807) Pulse Rate:  [58-67] 66 (10/07 0807) Resp:  [16-18] 16 (10/07 0807) BP: (130-143)/(58-63) 130/63 (10/07 0807) SpO2:  [95 %-98 %] 95 % (10/07 0807)  Intake/Output from previous day: No intake/output data recorded. Intake/Output this shift: No intake/output data recorded.  Recent Labs    05/02/23 1351  HGB 14.2   Recent Labs    05/02/23 1351  WBC 13.1*  RBC 4.94  HCT 42.6  PLT 197   No results for input(s): "NA", "K", "CL", "CO2", "BUN", "CREATININE", "GLUCOSE", "CALCIUM" in the last 72 hours. No results for input(s): "LABPT", "INR" in the last 72 hours.  Neurologically intact Neurovascular intact Sensation intact distally Intact pulses distally Dorsiflexion/Plantar flexion intact Incision: dressing C/D/I No cellulitis present Compartment soft   Assessment/Plan: 3 Days Post-Op Procedure(s) (LRB): TOTAL HIP ARTHROPLASTY ANTERIOR APPROACH (Right) Advance diet Up with therapy D/C IV fluids Discharge to SNF once bed available and insurance approves WBAT RLE      Cristie Hem 05/04/2023, 9:18 AM

## 2023-05-04 NOTE — TOC Transition Note (Signed)
Transition of Care [facility]) - CM/SW Discharge Note   Patient Details  Name: ZOEY GILKESON MRN: [id number] Date of Birth: 10-24-1950  Transition of Care [facility]) CM/SW Contact:  Lorri Frederick, LCSW Phone Number: 05/04/2023, 3:08 PM   Clinical Narrative:   Pt discharging to [facility].  RN call report to [phone].  Pt son Haydee Salter will transport pt to [place].      Final next level of care: Skilled Nursing Facility Barriers to Discharge: Barriers Resolved   Patient Goals and CMS Choice   Choice offered to / list presented to : Patient (requesting [facility])  Discharge Placement                Patient chooses bed at: [facility] Rehab Patient to be transferred to facility by: son Sharod Name of family member notified: daughter Asher Muir, son Sharod Patient and family notified of of transfer: 05/04/23  Discharge Plan and Services Additional resources added to the After Visit Summary for   In-house Referral: Clinical Social Work   Post Acute Care Choice: Skilled Nursing Facility                               Social Determinants of Health (SDOH) Interventions SDOH Screenings   Food Insecurity: Not on File (04/21/2022)   Received from [employer] Needs: Not on File (04/21/2022)   Received from [employer] Strain: Not on File (04/21/2022)   Received from [facility]  Physical Activity: Not on File (11/14/2021)   Received from [place], [place]  Social Connections: Not on File (04/06/2023)   Received from [facility]  Stress: Not on File (11/14/2021)   Received from [place], [place]  Tobacco Use: Medium Risk (05/03/2023)     Readmission Risk Interventions     No data to display

## 2023-05-04 NOTE — Plan of Care (Signed)

## 2023-05-14 ENCOUNTER — Ambulatory Visit (INDEPENDENT_AMBULATORY_CARE_PROVIDER_SITE_OTHER): Payer: 59 | Admitting: Orthopaedic Surgery

## 2023-05-14 DIAGNOSIS — Z96641 Presence of right artificial hip joint: Secondary | ICD-10-CM

## 2023-05-14 NOTE — Progress Notes (Signed)
Post-Op Visit Note   Patient: Joe Garcia           Date of Birth: [birth year]           MRN: [id number] Visit Date: 05/14/2023 PCP: Hillery Aldo, NP   Assessment & Plan:  Chief Complaint:  Chief Complaint  Patient presents with   Right Hip - Routine Post Op   Visit Diagnoses:  1. Status post total replacement of right hip     Plan: [name] is here for 2-week postop visit status post right total hip replacement.  He is at [facility].  He is doing well.  He has no real complaints.  Exam of the right hip shows a healed surgical incision.  No signs of infection.  Fluid painless range of motion.  Leg lengths are equal.  Sutures removed Steri-Strips applied.  Patient needs to finish out his 4-week Xarelto for postoperative DVT prophylaxis.  He has home health PT set up.  We will recheck him in 4 weeks with repeat x-rays of the pelvis.  Follow-Up Instructions: Return in about 4 weeks (around 06/11/2023) for with lindsey.   Orders:  No orders of the defined types were placed in this encounter.  No orders of the defined types were placed in this encounter.   Imaging: No results found.  PMFS History: Patient Active Problem List   Diagnosis Date Noted   Postoperative urinary retention 05/02/2023   Status post total replacement of right hip 05/01/2023   Primary osteoarthritis of right hip 04/30/2023   Hyperlipidemia 08/05/2022   Genetic testing 09/07/2020   Acute-on-chronic kidney injury (HCC) 02/12/2020   Sepsis (HCC) 02/12/2020   Pneumonia 02/12/2020   Multifocal pneumonia 02/11/2020   Port-A-Cath in place 08/04/2019   Chronic systolic heart failure (HCC)    Colonic obstruction (HCC)    Cancer of transverse colon s/p partial colectomy 06/30/2019    Nausea & vomiting 06/26/2019   Colonic mass 06/26/2019   COPD GOLD ?  11/24/2018   Chest pain    NSTEMI (non-ST elevated myocardial infarction) (HCC) 10/03/2018   COPD with acute exacerbation (HCC)  07/26/2018   ARF (acute renal failure) (HCC) 07/26/2018   Nausea and vomiting 07/26/2018   Weight loss, unintentional 07/26/2018   Malnutrition of moderate degree 07/16/2017   CKD (chronic kidney disease), stage II 07/14/2017   Solitary kidney, congenital 06/23/2017   Nonischemic cardiomyopathy (HCC)    Multifocal PVCs    Chronic combined systolic (congestive) and diastolic (congestive) heart failure (HCC) 06/11/2017   Hypokalemia 05/05/2017   DOE (dyspnea on exertion) 07/16/2013   Alcohol abuse 09/25/2012   Alcohol dependence (HCC) 09/22/2012   Essential hypertension 03/22/2007   DEGENERATIVE DISC DISEASE 03/22/2007   AVASCULAR NECROSIS 03/22/2007   Past Medical History:  Diagnosis Date   Alcoholism (HCC)    hx- hasn't drank since 2014   Allergy    Arthritis    hips, L shoulder   Asthma    Cancer (HCC) 05/2019   colon cancer   CHF (congestive heart failure) (HCC)    Chronic kidney disease    only has one kidney   COPD (chronic obstructive pulmonary disease) (HCC)    COVID-19    Depression    pt denies   Dyspnea    Dysrhythmia    PVC's   GERD (gastroesophageal reflux disease)    Heart murmur    Hyperlipidemia    Hypertension    Myocardial infarction [facility])    pt is unsure  of date- believes early 2020   Osteoporosis    Pneumonia    Substance abuse (HCC)    alcoholism over 10 yrs ago    Family History  Problem Relation Age of Onset   Heart disease Father    Heart disease Daughter        "fluid around heart"    Canavan disease Daughter    Cancer Daughter        unsure of type   Asthma Son    Heart failure Mother    Kidney failure Mother    Colon cancer Neg Hx    Esophageal cancer Neg Hx    Rectal cancer Neg Hx    Stomach cancer Neg Hx     Past Surgical History:  Procedure Laterality Date   BIOPSY  06/27/2019   Procedure: BIOPSY;  Surgeon: Benancio Deeds, MD;  Location: [facility] ENDOSCOPY;  Service: Gastroenterology;;   CATARACT EXTRACTION      COLONOSCOPY Left 06/27/2019   Procedure: COLONOSCOPY;  Surgeon: Benancio Deeds, MD;  Location: [facility] ENDOSCOPY;  Service: Gastroenterology;  Laterality: Left;   COLONOSCOPY     LAPAROTOMY N/A 06/30/2019   Procedure: EXPLORATORY LAPAROTOMY;  Surgeon: Axel Filler, MD;  Location: [facility] OR;  Service: General;  Laterality: N/A;   LEFT HEART CATH AND CORONARY ANGIOGRAPHY N/A 10/04/2018   Procedure: LEFT HEART CATH AND CORONARY ANGIOGRAPHY;  Surgeon: Corky Crafts, MD;  Location: [facility] INVASIVE CV LAB;  Service: Cardiovascular;  Laterality: N/A;   PARTIAL COLECTOMY N/A 06/30/2019   Procedure: PARTIAL COLECTOMY;  Surgeon: Axel Filler, MD;  Location: [facility] OR;  Service: General;  Laterality: N/A;   PORTACATH PLACEMENT Left 08/02/2019   Procedure: INSERTION PORT-A-CATH LEFT CHEST;  Surgeon: Axel Filler, MD;  Location: [facility] OR;  Service: General;  Laterality: Left;   RIGHT/LEFT HEART CATH AND CORONARY ANGIOGRAPHY N/A 06/12/2017   Procedure: RIGHT/LEFT HEART CATH AND CORONARY ANGIOGRAPHY;  Surgeon: Dolores Patty, MD;  Location: [facility] INVASIVE CV LAB;  Service: Cardiovascular;  Laterality: N/A;   TOTAL HIP ARTHROPLASTY Right 05/01/2023   Procedure: TOTAL HIP ARTHROPLASTY ANTERIOR APPROACH;  Surgeon: Tarry Kos, MD;  Location: [facility] OR;  Service: Orthopedics;  Laterality: Right;   Social History   Occupational History   Occupation: "it don't work for me", on disability  Tobacco Use   Smoking status: Former    Current packs/day: 0.00    Average packs/day: 0.1 packs/day for 45.0 years (4.5 ttl pk-yrs)    Types: Cigarettes    Start date: 53    Quit date: 2012    Years since quitting: 12.8   Smokeless tobacco: Never  Vaping Use   Vaping status: Never Used  Substance and Sexual Activity   Alcohol use: No    Comment: Pt states no etoh since April 2014   Drug use: Not Currently    Comment: Prior use of crack cocaine, quit 2012   Sexual activity: Never

## 2023-05-15 ENCOUNTER — Encounter: Payer: 59 | Admitting: Orthopaedic Surgery

## 2023-05-18 ENCOUNTER — Telehealth: Payer: Self-pay | Admitting: Orthopaedic Surgery

## 2023-05-18 NOTE — Telephone Encounter (Signed)
Called and gave verbal via voicemail.

## 2023-05-18 NOTE — Telephone Encounter (Signed)
Charrisee (PT) from [facility] called requesting verbal orders for 2 wk 4. Also pt complained pain level 7 out of 10 but did not take his pain meds. Charrisse secure number is [phone]. Leave vm if needed

## 2023-05-19 ENCOUNTER — Telehealth: Payer: Self-pay | Admitting: Orthopaedic Surgery

## 2023-05-19 NOTE — Telephone Encounter (Signed)
Called and gave verbal 

## 2023-05-19 NOTE — Telephone Encounter (Signed)
Spoke with Will (OT) with [facility] needing verbal orders for HHOT 2 Wk 2 and 1 Wk 1.  The number to contact Will is [phone]

## 2023-06-03 ENCOUNTER — Encounter: Payer: Self-pay | Admitting: Gastroenterology

## 2023-06-05 ENCOUNTER — Telehealth: Payer: Self-pay | Admitting: Orthopaedic Surgery

## 2023-06-05 NOTE — Telephone Encounter (Signed)
Called and gave verbal 

## 2023-06-05 NOTE — Telephone Encounter (Signed)
Charrise from [facility] called requesting verbal orders for PT 2w4 1w1 starting Nov 18. CB# [phone]

## 2023-06-11 ENCOUNTER — Encounter: Payer: Self-pay | Admitting: Physician Assistant

## 2023-06-11 ENCOUNTER — Other Ambulatory Visit (INDEPENDENT_AMBULATORY_CARE_PROVIDER_SITE_OTHER): Payer: 59

## 2023-06-11 ENCOUNTER — Ambulatory Visit: Payer: 59 | Admitting: Physician Assistant

## 2023-06-11 DIAGNOSIS — Z96641 Presence of right artificial hip joint: Secondary | ICD-10-CM

## 2023-06-11 NOTE — Progress Notes (Signed)
Post-Op Visit Note   Patient: Joe Garcia           Date of Birth: Aug 12, 1950           MRN: [id number] Visit Date: 06/11/2023 PCP: Hillery Aldo, NP   Assessment & Plan:  Chief Complaint:  Chief Complaint  Patient presents with   Right Hip - Follow-up    Right total hip arthroplasty 05/01/2023   Visit Diagnoses:  1. Status post total replacement of right hip     Plan: Patient is a pleasant 72 year old gentleman who comes in today 6 weeks status post right total hip replacement 05/01/2023.  He has been doing okay.  He is currently ambulating with a walker.  He is in minimal pain but does take occasional oxycodone at night.  He is still taking his Xarelto for DVT prophylaxis.  Right hip exam reveals painless hip flexion and logroll.  He is neurovascularly intact distally.  At this point, he may discontinue the Xarelto from an orthopedic standpoint.  Continue to work on range of motion and strengthening activities.  Follow-up in 6 weeks for recheck.  Follow-Up Instructions: Return in about 6 weeks (around 07/23/2023).   Orders:  Orders Placed This Encounter  Procedures   XR Pelvis 1-2 Views   No orders of the defined types were placed in this encounter.   Imaging: XR Pelvis 1-2 Views  Result Date: 06/11/2023 Well-seated prosthesis without complication   PMFS History: Patient Active Problem List   Diagnosis Date Noted   Postoperative urinary retention 05/02/2023   Status post total replacement of right hip 05/01/2023   Primary osteoarthritis of right hip 04/30/2023   Hyperlipidemia 08/05/2022   Genetic testing 09/07/2020   Acute-on-chronic kidney injury (HCC) 02/12/2020   Sepsis (HCC) 02/12/2020   Pneumonia 02/12/2020   Multifocal pneumonia 02/11/2020   Port-A-Cath in place 08/04/2019   Chronic systolic heart failure (HCC)    Colonic obstruction (HCC)    Cancer of transverse colon s/p partial colectomy 06/30/2019    Nausea & vomiting 06/26/2019   Colonic mass  06/26/2019   COPD GOLD ?  11/24/2018   Chest pain    NSTEMI (non-ST elevated myocardial infarction) (HCC) 10/03/2018   COPD with acute exacerbation (HCC) 07/26/2018   ARF (acute renal failure) (HCC) 07/26/2018   Nausea and vomiting 07/26/2018   Weight loss, unintentional 07/26/2018   Malnutrition of moderate degree 07/16/2017   CKD (chronic kidney disease), stage II 07/14/2017   Solitary kidney, congenital 06/23/2017   Nonischemic cardiomyopathy (HCC)    Multifocal PVCs    Chronic combined systolic (congestive) and diastolic (congestive) heart failure (HCC) 06/11/2017   Hypokalemia 05/05/2017   DOE (dyspnea on exertion) 07/16/2013   Alcohol abuse 09/25/2012   Alcohol dependence (HCC) 09/22/2012   Essential hypertension 03/22/2007   DEGENERATIVE DISC DISEASE 03/22/2007   AVASCULAR NECROSIS 03/22/2007   Past Medical History:  Diagnosis Date   Alcoholism (HCC)    hx- hasn't drank since 2014   Allergy    Arthritis    hips, L shoulder   Asthma    Cancer (HCC) 05/2019   colon cancer   CHF (congestive heart failure) (HCC)    Chronic kidney disease    only has one kidney   COPD (chronic obstructive pulmonary disease) (HCC)    COVID-19    Depression    pt denies   Dyspnea    Dysrhythmia    PVC's   GERD (gastroesophageal reflux disease)    Heart murmur  Hyperlipidemia    Hypertension    Myocardial infarction [facility])    pt is unsure of date- believes early 2020   Osteoporosis    Pneumonia    Substance abuse (HCC)    alcoholism over 10 yrs ago    Family History  Problem Relation Age of Onset   Heart disease Father    Heart disease Daughter        "fluid around heart"    Canavan disease Daughter    Cancer Daughter        unsure of type   Asthma Son    Heart failure Mother    Kidney failure Mother    Colon cancer Neg Hx    Esophageal cancer Neg Hx    Rectal cancer Neg Hx    Stomach cancer Neg Hx     Past Surgical History:  Procedure Laterality Date   BIOPSY   06/27/2019   Procedure: BIOPSY;  Surgeon: Benancio Deeds, MD;  Location: [facility] ENDOSCOPY;  Service: Gastroenterology;;   CATARACT EXTRACTION     COLONOSCOPY Left 06/27/2019   Procedure: COLONOSCOPY;  Surgeon: Benancio Deeds, MD;  Location: [facility] ENDOSCOPY;  Service: Gastroenterology;  Laterality: Left;   COLONOSCOPY     LAPAROTOMY N/A 06/30/2019   Procedure: EXPLORATORY LAPAROTOMY;  Surgeon: Axel Filler, MD;  Location: [facility] OR;  Service: General;  Laterality: N/A;   LEFT HEART CATH AND CORONARY ANGIOGRAPHY N/A 10/04/2018   Procedure: LEFT HEART CATH AND CORONARY ANGIOGRAPHY;  Surgeon: Corky Crafts, MD;  Location: [facility] INVASIVE CV LAB;  Service: Cardiovascular;  Laterality: N/A;   PARTIAL COLECTOMY N/A 06/30/2019   Procedure: PARTIAL COLECTOMY;  Surgeon: Axel Filler, MD;  Location: [facility] OR;  Service: General;  Laterality: N/A;   PORTACATH PLACEMENT Left 08/02/2019   Procedure: INSERTION PORT-A-CATH LEFT CHEST;  Surgeon: Axel Filler, MD;  Location: [facility] OR;  Service: General;  Laterality: Left;   RIGHT/LEFT HEART CATH AND CORONARY ANGIOGRAPHY N/A 06/12/2017   Procedure: RIGHT/LEFT HEART CATH AND CORONARY ANGIOGRAPHY;  Surgeon: Dolores Patty, MD;  Location: [facility] INVASIVE CV LAB;  Service: Cardiovascular;  Laterality: N/A;   TOTAL HIP ARTHROPLASTY Right 05/01/2023   Procedure: TOTAL HIP ARTHROPLASTY ANTERIOR APPROACH;  Surgeon: Tarry Kos, MD;  Location: [facility] OR;  Service: Orthopedics;  Laterality: Right;   Social History   Occupational History   Occupation: "it don't work for me", on disability  Tobacco Use   Smoking status: Former    Current packs/day: 0.00    Average packs/day: 0.1 packs/day for 45.0 years (4.5 ttl pk-yrs)    Types: Cigarettes    Start date: 89    Quit date: 2012    Years since quitting: 12.8   Smokeless tobacco: Never  Vaping Use   Vaping status: Never Used  Substance and Sexual Activity   Alcohol use: No    Comment: Pt states no etoh since  April 2014   Drug use: Not Currently    Comment: Prior use of crack cocaine, quit 2012   Sexual activity: Never

## 2023-06-17 ENCOUNTER — Encounter: Payer: Self-pay | Admitting: Oncology

## 2023-06-17 NOTE — Telephone Encounter (Signed)
Telephone call  

## 2023-06-18 ENCOUNTER — Ambulatory Visit (HOSPITAL_BASED_OUTPATIENT_CLINIC_OR_DEPARTMENT_OTHER): Payer: 59

## 2023-06-18 ENCOUNTER — Inpatient Hospital Stay: Payer: 59

## 2023-06-19 ENCOUNTER — Other Ambulatory Visit: Payer: Self-pay | Admitting: Oncology

## 2023-06-19 ENCOUNTER — Inpatient Hospital Stay: Payer: 59

## 2023-06-19 ENCOUNTER — Inpatient Hospital Stay: Payer: 59 | Attending: Oncology

## 2023-06-19 ENCOUNTER — Encounter: Payer: Self-pay | Admitting: *Deleted

## 2023-06-19 ENCOUNTER — Other Ambulatory Visit: Payer: 59

## 2023-06-19 ENCOUNTER — Ambulatory Visit (HOSPITAL_COMMUNITY)
Admission: RE | Admit: 2023-06-19 | Discharge: 2023-06-19 | Disposition: A | Discharge status: Home / Self Care | Payer: 59 | Source: Ambulatory Visit | Attending: Oncology | Admitting: Oncology

## 2023-06-19 DIAGNOSIS — C184 Malignant neoplasm of transverse colon: Secondary | ICD-10-CM | POA: Insufficient documentation

## 2023-06-19 DIAGNOSIS — N261 Atrophy of kidney (terminal): Secondary | ICD-10-CM | POA: Insufficient documentation

## 2023-06-19 DIAGNOSIS — J45909 Unspecified asthma, uncomplicated: Secondary | ICD-10-CM | POA: Insufficient documentation

## 2023-06-19 DIAGNOSIS — R918 Other nonspecific abnormal finding of lung field: Secondary | ICD-10-CM | POA: Insufficient documentation

## 2023-06-19 DIAGNOSIS — D509 Iron deficiency anemia, unspecified: Secondary | ICD-10-CM | POA: Insufficient documentation

## 2023-06-19 DIAGNOSIS — Z8679 Personal history of other diseases of the circulatory system: Secondary | ICD-10-CM | POA: Insufficient documentation

## 2023-06-19 DIAGNOSIS — I5022 Chronic systolic (congestive) heart failure: Secondary | ICD-10-CM | POA: Insufficient documentation

## 2023-06-19 DIAGNOSIS — Z9221 Personal history of antineoplastic chemotherapy: Secondary | ICD-10-CM | POA: Insufficient documentation

## 2023-06-19 DIAGNOSIS — Z79899 Other long term (current) drug therapy: Secondary | ICD-10-CM | POA: Insufficient documentation

## 2023-06-19 LAB — BASIC METABOLIC PANEL - CANCER CENTER ONLY
Anion gap: 5 (ref 5–15)
BUN: 15 mg/dL (ref 8–23)
CO2: 27 mmol/L (ref 22–32)
Calcium: 9.1 mg/dL (ref 8.9–10.3)
Chloride: 106 mmol/L (ref 98–111)
Creatinine: 1.42 mg/dL — ABNORMAL HIGH (ref 0.61–1.24)
GFR, Estimated: 53 mL/min — ABNORMAL LOW (ref >60)
Glucose, Bld: 87 mg/dL (ref 70–99)
Potassium: 3.8 mmol/L (ref 3.5–5.1)
Sodium: 138 mmol/L (ref 135–145)

## 2023-06-19 MED ORDER — IOHEXOL 300 MG/ML  SOLN: 80.0000 mL | Freq: Once | INTRAMUSCULAR | Status: DC | PRN

## 2023-06-19 MED ORDER — IOHEXOL 9 MG/ML PO SOLN
500.0000 mL | ORAL | Status: AC
  Administered 2023-06-19 (×2): 500 mL via ORAL

## 2023-06-19 NOTE — Progress Notes (Signed)
@   1604 Notified Taya at [facility] CT department that creatinine is elevated and Dr. Truett Perna does not want him to have IV contrast. She will communicate this to tech/radiologist.

## 2023-06-23 ENCOUNTER — Encounter: Payer: Self-pay | Admitting: *Deleted

## 2023-06-23 ENCOUNTER — Encounter: Payer: Self-pay | Admitting: Nurse Practitioner

## 2023-06-23 ENCOUNTER — Inpatient Hospital Stay (HOSPITAL_BASED_OUTPATIENT_CLINIC_OR_DEPARTMENT_OTHER): Payer: 59 | Admitting: Nurse Practitioner

## 2023-06-23 ENCOUNTER — Other Ambulatory Visit: Payer: Self-pay | Admitting: *Deleted

## 2023-06-23 VITALS — BP 139/70 | HR 63 | Temp 98.2°F | Resp 18 | Ht 71.0 in | Wt 157.0 lb

## 2023-06-23 DIAGNOSIS — Z79899 Other long term (current) drug therapy: Secondary | ICD-10-CM | POA: Diagnosis not present

## 2023-06-23 DIAGNOSIS — N261 Atrophy of kidney (terminal): Secondary | ICD-10-CM | POA: Diagnosis not present

## 2023-06-23 DIAGNOSIS — C184 Malignant neoplasm of transverse colon: Secondary | ICD-10-CM | POA: Diagnosis not present

## 2023-06-23 DIAGNOSIS — D509 Iron deficiency anemia, unspecified: Secondary | ICD-10-CM | POA: Diagnosis not present

## 2023-06-23 DIAGNOSIS — R918 Other nonspecific abnormal finding of lung field: Secondary | ICD-10-CM | POA: Diagnosis not present

## 2023-06-23 DIAGNOSIS — I5022 Chronic systolic (congestive) heart failure: Secondary | ICD-10-CM | POA: Diagnosis not present

## 2023-06-23 DIAGNOSIS — Z8679 Personal history of other diseases of the circulatory system: Secondary | ICD-10-CM | POA: Diagnosis not present

## 2023-06-23 DIAGNOSIS — J45909 Unspecified asthma, uncomplicated: Secondary | ICD-10-CM | POA: Diagnosis not present

## 2023-06-23 DIAGNOSIS — Z9221 Personal history of antineoplastic chemotherapy: Secondary | ICD-10-CM | POA: Diagnosis not present

## 2023-06-23 NOTE — Progress Notes (Signed)
[facility] OFFICE PROGRESS NOTE   Diagnosis: Colon cancer  INTERVAL HISTORY:   Joe Garcia returns for follow-up.  He feels well.  No change in bowel habits.  No blood with bowel movements.  No abdominal pain.  No nausea or vomiting.  He notes significant improvement in right hip pain/mobility since undergoing a total right hip replacement 05/01/2023.  He is participating in physical therapy.  Objective:  Vital signs in last 24 hours:  Blood pressure 139/70, pulse 63, temperature 98.2 F (36.8 C), temperature source Temporal, resp. rate 18, height 5\' 11"  (1.803 m), weight 157 lb (71.2 kg), SpO2 100%.    Lymphatics: No palpable cervical, supraclavicular, axillary or inguinal lymph nodes. Resp: Lungs clear bilaterally. Cardio: Regular rate and rhythm. GI: No hepatosplenomegaly. Vascular: No leg edema. Musculoskeletal: Healed surgical incision right hip.  Lab Results:  Lab Results  Component Value Date   WBC 13.1 (H) 05/02/2023   HGB 14.2 05/02/2023   HCT 42.6 05/02/2023   MCV 86.2 05/02/2023   PLT 197 05/02/2023   NEUTROABS 2.6 08/18/2022    Imaging:  No results found.  Medications: I have reviewed the patient's current medications.  Assessment/Plan: Colon cancer, transverse colon, stage IIIc, T4bN2, status post a partial transverse colectomy 06/30/2019 Grade 3, lymphovascular and perineural invasion present, 4/18 lymph nodes positive, positive lymph nodes mesenteric margin, tumor invades into adherent omentum, MSI high, loss of MLH1 and PMS2 expression; MLH1 methylation not detected; BRAF mutation analysis-negative CT abdomen/pelvis 06/26/2019-thickening within the distal transverse colon, severely atrophic and calcified right kidney CT chest 06/28/2019-no evidence of metastatic disease, stable 3 mm right lower lobe nodule Cycle 1 FOLFOX 08/04/2019 (oxaliplatin 65 mg/m due to renal function) Plan for repeat CTs after he has completed 5 cycles of  chemotherapy Cycle 2 FOLFOX 08/18/2019 Cycle 3 FOLFOX 08/31/2019, Udenyca added Cycle 4 FOLFOX 10/12/2019 Cycle 5 FOLFOX 10/25/2019 Stable right lower lobe nodule on chest CT 09/21/2019 CT abdomen/pelvis 11/07/2019-postoperative changes related to colectomy and anastomosis.  No current evidence of disease.  No adenopathy. Cycle 6 FOLFOX 11/08/2019 Cycle 7 FOLFOX 11/23/2019 Cycle 8 FOLFOX 12/06/2019 Cycle 9 FOLFOX 12/20/2019 Case presented GI tumor conference 12/28/2018-tumor margin negative, positive lymph node at margin; no recommendation for further surgery or radiation; peripancreatic node on baseline CT not seen on April 2021 CT Cycle 10 FOLFOX 01/03/2020  Cycle 11 FOLFOX 01/17/2020-oxaliplatin and Udenyca held Cycle 12 FOLFOX 01/31/2020-oxaliplatin and Udenyca held, 5-FU dose reduced secondary to mucositis 05/25/2020 colonoscopy-internal hemorrhoids, repeat colonoscopy in 3 years for surveillance CTs 06/11/2020-new trace right pelvic fluid, nonspecific; new mild left upper lobe interstitial/groundglass opacity suspicious for minimal infection or inflammation; similar 2 mm right lower lobe pulmonary nodule CTs 05/23/2021-no evidence of recurrent colon cancer CTs 05/30/2022-nonspecific groundglass opacities in the bilateral upper lobes, new 4 mm groundglass right upper lobe nodule, no evidence of metastatic disease CT chest 09/13/2022-markedly increased upper and mid lung zone predominant ill-defined centrilobular nodularity.  New 3 mm lateral right lower lobe nodule.  Previously questioned 4 mm groundglass right upper lobe nodule not appreciated. CT chest 03/16/2023-no evidence of metastatic disease, interval resolution of centrilobular groundglass nodules, solid right lower lobe pulmonary nodule which was new on 09/13/2022 has resolved CT abdomen/pelvis 06/19/2023-no locally recurrent or metastatic colon cancer Asthma Chronic systolic heart failure History of PVCs Atrophic right kidney Microcytic anemia,  likely iron deficiency anemia secondary to #1 Port-A-Cath placement on 12/15/2019, Dr. 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 [name] remains in clinical/radiographic remission from colon cancer.  He is now 4 years out from diagnosis.  He is due for a surveillance colonoscopy.  Referral placed at today's visit.  He will return for a CEA and follow-up visit in 6 months.  We are available to see him sooner if needed.    Lonna Cobb ANP/GNP-BC   06/23/2023  11:28 AM

## 2023-06-23 NOTE — Progress Notes (Signed)
Routed referral order, CT report and office note to Dr. Adela Lank. Due 3 year f/u colonoscopy this year.

## 2023-06-24 ENCOUNTER — Telehealth: Payer: Self-pay | Admitting: *Deleted

## 2023-06-24 ENCOUNTER — Encounter: Payer: Self-pay | Admitting: Oncology

## 2023-06-24 NOTE — Telephone Encounter (Signed)
-----   Message from Benancio Deeds sent at 06/23/2023  3:10 PM EST ----- Karen Kitchens can you please help schedule this patient for colonoscopy at the [facility] - for history of colon cancer? Thanks ----- Message ----- From: Wandalee Ferdinand, RN Sent: 06/23/2023   1:16 PM EST To: Benancio Deeds, MD  Due colonoscopy

## 2023-06-24 NOTE — Telephone Encounter (Signed)
Patient has scheduled colonoscopy on 08/03/23 at 3:00 pm. He has scheduled his previsit in person for 06/29/23 at 330 pm. Patient verbalizes understanding of time/date/location of both appointments.

## 2023-06-29 ENCOUNTER — Telehealth: Payer: Self-pay | Admitting: *Deleted

## 2023-06-29 NOTE — Telephone Encounter (Signed)
1st attempt was not able to leave messge. Just a busy siganl  Will attempt to reach again in 5 min due to no other # listed in profile [place] attempt VM not set up

## 2023-06-29 NOTE — Telephone Encounter (Signed)
Attempt to reach pt for pre-visit. LM with call back #. Will attempt to reach again in  5 min due to no other # listed in profile

## 2023-06-30 ENCOUNTER — Telehealth: Payer: Self-pay | Admitting: Orthopaedic Surgery

## 2023-06-30 NOTE — Telephone Encounter (Signed)
Correct.  He can be released to home exercises

## 2023-06-30 NOTE — Telephone Encounter (Signed)
Joe Garcia (PT) from [facility] called pt was discharged 11/29. She states pt is insurance will not paying for more therapy. Joe Garcia phone number is [phone]

## 2023-07-09 ENCOUNTER — Telehealth: Payer: Self-pay

## 2023-07-09 NOTE — Telephone Encounter (Signed)
-----   Message from Thornton Papas sent at 07/08/2023  8:07 PM EST ----- Please call patient, Cts are negative for recurrent cancer, f/u as scheduled

## 2023-07-09 NOTE — Telephone Encounter (Signed)
Understood CT results, follow up as scheduled

## 2023-07-15 ENCOUNTER — Ambulatory Visit (AMBULATORY_SURGERY_CENTER): Payer: 59

## 2023-07-15 VITALS — Ht 71.0 in | Wt 156.0 lb

## 2023-07-15 DIAGNOSIS — Z85038 Personal history of other malignant neoplasm of large intestine: Secondary | ICD-10-CM

## 2023-07-15 MED ORDER — NA SULFATE-K SULFATE-MG SULF 17.5-3.13-1.6 GM/177ML PO SOLN
1.0000 | Freq: Once | ORAL | 0 refills | Status: AC
Start: 2023-07-15 — End: 2023-07-15

## 2023-07-15 NOTE — Progress Notes (Signed)
No egg or soy allergy known to patient  No issues known to pt with past sedation with any surgeries or procedures Patient denies ever being told they had issues or difficulty with intubation  No FH of Malignant Hyperthermia Pt is not on diet pills Pt is not on  home 02  Pt is not on blood thinners  Pt reports occ constipation nothing chronic  No A fib or A flutter Have any cardiac testing pending-- no LOA: independent with walker pt s/p hip replacement 05/21/23 Prep:   Patient's chart reviewed by Cathlyn Parsons CNRA prior to previsit and patient appropriate for the [facility].  Previsit completed and red dot placed by patient's name on their procedure day (on provider's schedule).     PV competed with patient. Prep instructions sent via mychart and home address.

## 2023-07-28 ENCOUNTER — Ambulatory Visit (INDEPENDENT_AMBULATORY_CARE_PROVIDER_SITE_OTHER): Payer: 59 | Admitting: Physician Assistant

## 2023-07-28 DIAGNOSIS — Z96641 Presence of right artificial hip joint: Secondary | ICD-10-CM

## 2023-07-28 NOTE — Progress Notes (Signed)
 Post-Op Visit Note   Patient: Joe Garcia           Date of Birth: [birth year]           MRN: [id number] Visit Date: 07/28/2023 PCP: Campbell Reynolds, NP   Assessment & Plan:  Chief Complaint:  Chief Complaint  Patient presents with   Right Hip - Pain   Visit Diagnoses:  1. Status post total replacement of right hip     Plan: Patient is a very pleasant 72 year old gentleman who comes in today 3 months status post right total hip replacement 05/01/2023.  He has been doing great.  He does note some paresthesias to the lateral hip.  He also has some soreness when he overdoes it but nothing more.  He is continuing to work on a home exercise program.  He is still ambulating with a walker which she was using prior to hip replacement surgery.  Examination of his right hip reveals painless hip flexion and logroll.  He is neurovascularly intact distally.  At this point, he will continue to advance with activity as tolerated.  Dental prophylaxis reinforced.  Follow-up in 3 months for repeat evaluation and AP pelvis x-rays.  Call with concerns or questions.  Follow-Up Instructions: Return in about 3 months (around 10/26/2023).   Orders:  No orders of the defined types were placed in this encounter.  No orders of the defined types were placed in this encounter.   Imaging:   PMFS History: Patient Active Problem List   Diagnosis Date Noted   Postoperative urinary retention 05/02/2023   Status post total replacement of right hip 05/01/2023   Primary osteoarthritis of right hip 04/30/2023   Hyperlipidemia 08/05/2022   Genetic testing 09/07/2020   Acute-on-chronic kidney injury (HCC) 02/12/2020   Sepsis (HCC) 02/12/2020   Pneumonia 02/12/2020   Multifocal pneumonia 02/11/2020   Port-A-Cath in place 08/04/2019   Chronic systolic heart failure (HCC)    Colonic obstruction (HCC)    Cancer of transverse colon s/p partial colectomy 06/30/2019    Nausea & vomiting 06/26/2019   Colonic  mass 06/26/2019   COPD GOLD ?  11/24/2018   Chest pain    NSTEMI (non-ST elevated myocardial infarction) (HCC) 10/03/2018   COPD with acute exacerbation (HCC) 07/26/2018   ARF (acute renal failure) (HCC) 07/26/2018   Nausea and vomiting 07/26/2018   Weight loss, unintentional 07/26/2018   Malnutrition of moderate degree 07/16/2017   CKD (chronic kidney disease), stage II 07/14/2017   Solitary kidney, congenital 06/23/2017   Nonischemic cardiomyopathy (HCC)    Multifocal PVCs    Chronic combined systolic (congestive) and diastolic (congestive) heart failure (HCC) 06/11/2017   Hypokalemia 05/05/2017   DOE (dyspnea on exertion) 07/16/2013   Alcohol  abuse 09/25/2012   Alcohol  dependence (HCC) 09/22/2012   Essential hypertension 03/22/2007   DEGENERATIVE DISC DISEASE 03/22/2007   AVASCULAR NECROSIS 03/22/2007   Past Medical History:  Diagnosis Date   Alcoholism (HCC)    hx- hasn't drank since 2014   Allergy    Arthritis    hips, L shoulder   Asthma    Cancer (HCC) 05/2019   colon cancer   CHF (congestive heart failure) (HCC)    Chronic kidney disease    only has one kidney   COPD (chronic obstructive pulmonary disease) (HCC)    COVID-19    Depression    pt denies   Dyspnea    Dysrhythmia    PVC's   GERD (gastroesophageal reflux disease)  Heart murmur    Hyperlipidemia    Hypertension    Myocardial infarction [facility])    pt is unsure of date- believes early 2020   Osteoporosis    Pneumonia    Substance abuse (HCC)    alcoholism over 10 yrs ago    Family History  Problem Relation Age of Onset   Heart failure Mother    Kidney failure Mother    Heart disease Father    Heart disease Daughter        fluid around heart    Canavan disease Daughter    Cancer Daughter        unsure of type   Asthma Son    Colon cancer Neg Hx    Esophageal cancer Neg Hx    Rectal cancer Neg Hx    Stomach cancer Neg Hx    Colon polyps Neg Hx     Past Surgical History:  Procedure  Laterality Date   BIOPSY  06/27/2019   Procedure: BIOPSY;  Surgeon: Leigh Elspeth SQUIBB, MD;  Location: [facility] ENDOSCOPY;  Service: Gastroenterology;;   CATARACT EXTRACTION     COLONOSCOPY Left 06/27/2019   Procedure: COLONOSCOPY;  Surgeon: Leigh Elspeth SQUIBB, MD;  Location: [facility] ENDOSCOPY;  Service: Gastroenterology;  Laterality: Left;   COLONOSCOPY     LAPAROTOMY N/A 06/30/2019   Procedure: EXPLORATORY LAPAROTOMY;  Surgeon: Rubin Calamity, MD;  Location: [facility] OR;  Service: General;  Laterality: N/A;   LEFT HEART CATH AND CORONARY ANGIOGRAPHY N/A 10/04/2018   Procedure: LEFT HEART CATH AND CORONARY ANGIOGRAPHY;  Surgeon: Dann Candyce RAMAN, MD;  Location: [facility] INVASIVE CV LAB;  Service: Cardiovascular;  Laterality: N/A;   PARTIAL COLECTOMY N/A 06/30/2019   Procedure: PARTIAL COLECTOMY;  Surgeon: Rubin Calamity, MD;  Location: [facility] OR;  Service: General;  Laterality: N/A;   PORTACATH PLACEMENT Left 08/02/2019   Procedure: INSERTION PORT-A-CATH LEFT CHEST;  Surgeon: Rubin Calamity, MD;  Location: [facility] OR;  Service: General;  Laterality: Left;   RIGHT/LEFT HEART CATH AND CORONARY ANGIOGRAPHY N/A 06/12/2017   Procedure: RIGHT/LEFT HEART CATH AND CORONARY ANGIOGRAPHY;  Surgeon: Cherrie Toribio SAUNDERS, MD;  Location: [facility] INVASIVE CV LAB;  Service: Cardiovascular;  Laterality: N/A;   TOTAL HIP ARTHROPLASTY Right 05/01/2023   Procedure: TOTAL HIP ARTHROPLASTY ANTERIOR APPROACH;  Surgeon: Jerri Kay HERO, MD;  Location: [facility] OR;  Service: Orthopedics;  Laterality: Right;   Social History   Occupational History   Occupation: it don't work for me, on disability  Tobacco Use   Smoking status: Former    Current packs/day: 0.00    Average packs/day: 0.1 packs/day for 45.0 years (4.5 ttl pk-yrs)    Types: Cigarettes    Start date: 64    Quit date: 2012    Years since quitting: 13.0   Smokeless tobacco: Never  Vaping Use   Vaping status: Never Used  Substance and Sexual Activity   Alcohol  use: No    Comment:  Pt states no etoh since April 2014   Drug use: Not Currently    Comment: Prior use of crack cocaine, quit 2012   Sexual activity: Never

## 2023-07-30 ENCOUNTER — Encounter: Payer: Self-pay | Admitting: Certified Registered Nurse Anesthetist

## 2023-08-03 ENCOUNTER — Telehealth: Payer: Self-pay | Admitting: Gastroenterology

## 2023-08-03 ENCOUNTER — Encounter: Payer: 59 | Admitting: Gastroenterology

## 2023-08-03 NOTE — Telephone Encounter (Signed)
 Good Morning Dr. Adela Lank,  This patient called in and spoke to Schuyler Amor he stated that his care partner could not come and get him due to weather conditions.  Notes take from appointment desk.

## 2023-08-03 NOTE — Telephone Encounter (Signed)
Sorry to hear this, thanks for letting me know

## 2023-08-10 DIAGNOSIS — Z79899 Other long term (current) drug therapy: Secondary | ICD-10-CM | POA: Diagnosis not present

## 2023-09-27 ENCOUNTER — Encounter: Payer: Self-pay | Admitting: Oncology

## 2023-10-08 ENCOUNTER — Other Ambulatory Visit (HOSPITAL_COMMUNITY): Payer: Self-pay | Admitting: Cardiovascular Disease

## 2023-10-20 ENCOUNTER — Encounter: Payer: Self-pay | Admitting: Oncology

## 2023-10-27 ENCOUNTER — Encounter: Payer: Self-pay | Admitting: Oncology

## 2023-10-27 ENCOUNTER — Ambulatory Visit (INDEPENDENT_AMBULATORY_CARE_PROVIDER_SITE_OTHER): Payer: 59 | Admitting: Physician Assistant

## 2023-10-27 ENCOUNTER — Encounter: Payer: Self-pay | Admitting: Physician Assistant

## 2023-10-27 ENCOUNTER — Other Ambulatory Visit (INDEPENDENT_AMBULATORY_CARE_PROVIDER_SITE_OTHER)

## 2023-10-27 DIAGNOSIS — Z96641 Presence of right artificial hip joint: Secondary | ICD-10-CM

## 2023-10-27 NOTE — Progress Notes (Signed)
 Post-Op Visit Note   Patient: Joe Garcia           Date of Birth: 1950/09/16           MRN: [id number] Visit Date: 10/27/2023 PCP: Hillery Aldo, NP   Assessment & Plan:  Chief Complaint:  Chief Complaint  Patient presents with   Right Hip - Follow-up    Right total hip arthroplasty 05/01/2023   Visit Diagnoses:  1. Status post total replacement of right hip     Plan: Patient is a pleasant 73 year old gentleman who comes in today 6 months status post right total hip replacement 05/01/2023.  He has been doing well.  No pain to the hip.  He does still have burning sensation to the anterolateral hip.  He has been taking Tylenol as needed.  Continues to work on a home exercise program.  Currently ambulating with a cane which she is using for his left hip pain.  Examination of his right hip reveals painless hip flexion and logroll.  He is neurovascularly intact distally.  At this point, he will continue to advance with activity as tolerated.  Dental prophylaxis reinforced.  Follow-up in 6 months for repeat evaluation and AP pelvis x-rays.  In regards to the left hip, we have discussed total hip arthroplasty.  He is not quite ready but will follow-up with [place] when he is.  Call with concerns or questions.  Follow-Up Instructions: Return in about 6 months (around 04/27/2024).   Orders:  Orders Placed This Encounter  Procedures   XR Pelvis 1-2 Views   No orders of the defined types were placed in this encounter.   Imaging: XR Pelvis 1-2 Views Result Date: 10/27/2023 Well-seated prosthesis without complication   PMFS History: Patient Active Problem List   Diagnosis Date Noted   Postoperative urinary retention 05/02/2023   Status post total replacement of right hip 05/01/2023   Primary osteoarthritis of right hip 04/30/2023   Hyperlipidemia 08/05/2022   Genetic testing 09/07/2020   Acute-on-chronic kidney injury (HCC) 02/12/2020   Sepsis (HCC) 02/12/2020   Pneumonia  02/12/2020   Multifocal pneumonia 02/11/2020   Port-A-Cath in place 08/04/2019   Chronic systolic heart failure (HCC)    Colonic obstruction (HCC)    Cancer of transverse colon s/p partial colectomy 06/30/2019    Nausea & vomiting 06/26/2019   Colonic mass 06/26/2019   COPD GOLD ?  11/24/2018   Chest pain    NSTEMI (non-ST elevated myocardial infarction) (HCC) 10/03/2018   COPD with acute exacerbation (HCC) 07/26/2018   ARF (acute renal failure) (HCC) 07/26/2018   Nausea and vomiting 07/26/2018   Weight loss, unintentional 07/26/2018   Malnutrition of moderate degree 07/16/2017   CKD (chronic kidney disease), stage II 07/14/2017   Solitary kidney, congenital 06/23/2017   Nonischemic cardiomyopathy (HCC)    Multifocal PVCs    Chronic combined systolic (congestive) and diastolic (congestive) heart failure (HCC) 06/11/2017   Hypokalemia 05/05/2017   DOE (dyspnea on exertion) 07/16/2013   Alcohol abuse 09/25/2012   Alcohol dependence (HCC) 09/22/2012   Essential hypertension 03/22/2007   DEGENERATIVE DISC DISEASE 03/22/2007   AVASCULAR NECROSIS 03/22/2007   Past Medical History:  Diagnosis Date   Alcoholism (HCC)    hx- hasn't drank since 2014   Allergy    Arthritis    hips, L shoulder   Asthma    Cancer (HCC) 05/2019   colon cancer   CHF (congestive heart failure) (HCC)    Chronic kidney disease  only has one kidney   COPD (chronic obstructive pulmonary disease) (HCC)    COVID-19    Depression    pt denies   Dyspnea    Dysrhythmia    PVC's   GERD (gastroesophageal reflux disease)    Heart murmur    Hyperlipidemia    Hypertension    Myocardial infarction [facility])    pt is unsure of date- believes early 2020   Osteoporosis    Pneumonia    Substance abuse (HCC)    alcoholism over 10 yrs ago    Family History  Problem Relation Age of Onset   Heart failure Mother    Kidney failure Mother    Heart disease Father    Heart disease Daughter        "fluid around  heart"    Canavan disease Daughter    Cancer Daughter        unsure of type   Asthma Son    Colon cancer Neg Hx    Esophageal cancer Neg Hx    Rectal cancer Neg Hx    Stomach cancer Neg Hx    Colon polyps Neg Hx     Past Surgical History:  Procedure Laterality Date   BIOPSY  06/27/2019   Procedure: BIOPSY;  Surgeon: Benancio Deeds, MD;  Location: [facility] ENDOSCOPY;  Service: Gastroenterology;;   CATARACT EXTRACTION     COLONOSCOPY Left 06/27/2019   Procedure: COLONOSCOPY;  Surgeon: Benancio Deeds, MD;  Location: [facility] ENDOSCOPY;  Service: Gastroenterology;  Laterality: Left;   COLONOSCOPY     LAPAROTOMY N/A 06/30/2019   Procedure: EXPLORATORY LAPAROTOMY;  Surgeon: Axel Filler, MD;  Location: [facility] OR;  Service: General;  Laterality: N/A;   LEFT HEART CATH AND CORONARY ANGIOGRAPHY N/A 10/04/2018   Procedure: LEFT HEART CATH AND CORONARY ANGIOGRAPHY;  Surgeon: Corky Crafts, MD;  Location: [facility] INVASIVE CV LAB;  Service: Cardiovascular;  Laterality: N/A;   PARTIAL COLECTOMY N/A 06/30/2019   Procedure: PARTIAL COLECTOMY;  Surgeon: Axel Filler, MD;  Location: [facility] OR;  Service: General;  Laterality: N/A;   PORTACATH PLACEMENT Left 08/02/2019   Procedure: INSERTION PORT-A-CATH LEFT CHEST;  Surgeon: Axel Filler, MD;  Location: [facility] OR;  Service: General;  Laterality: Left;   RIGHT/LEFT HEART CATH AND CORONARY ANGIOGRAPHY N/A 06/12/2017   Procedure: RIGHT/LEFT HEART CATH AND CORONARY ANGIOGRAPHY;  Surgeon: Dolores Patty, MD;  Location: [facility] INVASIVE CV LAB;  Service: Cardiovascular;  Laterality: N/A;   TOTAL HIP ARTHROPLASTY Right 05/01/2023   Procedure: TOTAL HIP ARTHROPLASTY ANTERIOR APPROACH;  Surgeon: Tarry Kos, MD;  Location: [facility] OR;  Service: Orthopedics;  Laterality: Right;   Social History   Occupational History   Occupation: "it don't work for me", on disability  Tobacco Use   Smoking status: Former    Current packs/day: 0.00    Average packs/day: 0.1  packs/day for 45.0 years (4.5 ttl pk-yrs)    Types: Cigarettes    Start date: 52    Quit date: 2012    Years since quitting: 13.2   Smokeless tobacco: Never  Vaping Use   Vaping status: Never Used  Substance and Sexual Activity   Alcohol use: No    Comment: Pt states no etoh since April 2014   Drug use: Not Currently    Comment: Prior use of crack cocaine, quit 2012   Sexual activity: Never

## 2023-11-02 ENCOUNTER — Other Ambulatory Visit (HOSPITAL_COMMUNITY): Payer: Self-pay | Admitting: Cardiovascular Disease

## 2023-11-04 ENCOUNTER — Other Ambulatory Visit (HOSPITAL_COMMUNITY): Payer: Self-pay | Admitting: Cardiovascular Disease

## 2023-11-25 ENCOUNTER — Other Ambulatory Visit (HOSPITAL_COMMUNITY): Payer: Self-pay | Admitting: Cardiovascular Disease

## 2023-12-02 ENCOUNTER — Other Ambulatory Visit (HOSPITAL_COMMUNITY): Payer: Self-pay | Admitting: Cardiovascular Disease

## 2023-12-03 DIAGNOSIS — E785 Hyperlipidemia, unspecified: Secondary | ICD-10-CM | POA: Diagnosis not present

## 2023-12-03 DIAGNOSIS — Z139 Encounter for screening, unspecified: Secondary | ICD-10-CM | POA: Diagnosis not present

## 2023-12-03 DIAGNOSIS — Z0001 Encounter for general adult medical examination with abnormal findings: Secondary | ICD-10-CM | POA: Diagnosis not present

## 2023-12-03 DIAGNOSIS — Z1321 Encounter for screening for nutritional disorder: Secondary | ICD-10-CM | POA: Diagnosis not present

## 2023-12-03 DIAGNOSIS — I493 Ventricular premature depolarization: Secondary | ICD-10-CM | POA: Diagnosis not present

## 2023-12-03 DIAGNOSIS — J309 Allergic rhinitis, unspecified: Secondary | ICD-10-CM | POA: Diagnosis not present

## 2023-12-03 DIAGNOSIS — R2681 Unsteadiness on feet: Secondary | ICD-10-CM | POA: Diagnosis not present

## 2023-12-03 DIAGNOSIS — J449 Chronic obstructive pulmonary disease, unspecified: Secondary | ICD-10-CM | POA: Diagnosis not present

## 2023-12-03 DIAGNOSIS — Z79899 Other long term (current) drug therapy: Secondary | ICD-10-CM | POA: Diagnosis not present

## 2023-12-03 DIAGNOSIS — I130 Hypertensive heart and chronic kidney disease with heart failure and stage 1 through stage 4 chronic kidney disease, or unspecified chronic kidney disease: Secondary | ICD-10-CM | POA: Diagnosis not present

## 2023-12-04 DIAGNOSIS — Z79899 Other long term (current) drug therapy: Secondary | ICD-10-CM | POA: Diagnosis not present

## 2023-12-04 DIAGNOSIS — Z1321 Encounter for screening for nutritional disorder: Secondary | ICD-10-CM | POA: Diagnosis not present

## 2023-12-09 ENCOUNTER — Telehealth: Payer: Self-pay | Admitting: Cardiovascular Disease

## 2023-12-09 MED ORDER — CARVEDILOL 3.125 MG PO TABS: 3.1250 mg | ORAL_TABLET | Freq: Two times a day (BID) | ORAL | 0 refills | Status: AC

## 2023-12-09 MED ORDER — ISOSORB DINITRATE-HYDRALAZINE 20-37.5 MG PO TABS: 1.0000 | ORAL_TABLET | Freq: Three times a day (TID) | ORAL | 0 refills | Status: DC

## 2023-12-09 MED ORDER — LOSARTAN POTASSIUM 50 MG PO TABS: 50.0000 mg | ORAL_TABLET | Freq: Every morning | ORAL | 0 refills | Status: AC

## 2023-12-09 MED ORDER — AMIODARONE HCL 200 MG PO TABS: 100.0000 mg | ORAL_TABLET | Freq: Every morning | ORAL | 0 refills | Status: DC

## 2023-12-09 MED ORDER — FUROSEMIDE 20 MG PO TABS: 20.0000 mg | ORAL_TABLET | Freq: Every morning | ORAL | 0 refills | Status: DC

## 2023-12-09 MED ORDER — AMLODIPINE BESYLATE 5 MG PO TABS: 5.0000 mg | ORAL_TABLET | Freq: Every morning | ORAL | 0 refills | Status: DC

## 2023-12-09 NOTE — Telephone Encounter (Signed)
 RX's sent to requested Pharmacy

## 2023-12-09 NOTE — Telephone Encounter (Signed)
*  STAT* If patient is at the pharmacy, call can be transferred to refill team.   1. Which medications need to be refilled? (please list name of each medication and dose if known)   amiodarone  (PACERONE ) 200 MG tablet TAKE 1/2 TABLETS (100 MG TOTAL) BY MOUTH EVERY MORNING.   amLODipine  (NORVASC ) 5 MG tablet TAKE 1 TABLET (5 MG TOTAL) BY MOUTH DAILY (AM)    carvedilol  (COREG ) 3.125 MG tablet TAKE 1 TABLET (3.125 MG TOTAL) BY MOUTH 2 (TWO) TIMES DAILY WITH A MEAL. (AM+BEDTIME)    furosemide  (LASIX ) 20 MG tablet TAKE 1 TABLET (20 MG TOTAL) BY MOUTH DAILY (AM)   isosorbide -hydrALAZINE  (BIDIL ) 20-37.5 MG tablet TAKE 1 TABLET BY MOUTH 3 (THREE) TIMES DAILY (AM+NOON+BEDTIME)    losartan  (COZAAR ) 50 MG tablet TAKE 1 TABLET (50 MG TOTAL) BY MOUTH DAILY (AM)      4. Which pharmacy/location (including street and city if local pharmacy) is medication to be sent to? [facility] - [place], [place] - [address]     5. Do they need a 30 day or 90 day supply? 90  Pt scheduled for 01/12/24

## 2023-12-24 ENCOUNTER — Inpatient Hospital Stay (HOSPITAL_BASED_OUTPATIENT_CLINIC_OR_DEPARTMENT_OTHER): Payer: 59 | Admitting: Oncology

## 2023-12-24 ENCOUNTER — Inpatient Hospital Stay: Payer: 59 | Attending: Oncology

## 2023-12-24 VITALS — BP 130/71 | HR 92 | Temp 98.0°F | Resp 18 | Ht 71.0 in | Wt 168.3 lb

## 2023-12-24 DIAGNOSIS — Z79899 Other long term (current) drug therapy: Secondary | ICD-10-CM | POA: Diagnosis not present

## 2023-12-24 DIAGNOSIS — D649 Anemia, unspecified: Secondary | ICD-10-CM | POA: Diagnosis not present

## 2023-12-24 DIAGNOSIS — J45909 Unspecified asthma, uncomplicated: Secondary | ICD-10-CM | POA: Diagnosis not present

## 2023-12-24 DIAGNOSIS — C184 Malignant neoplasm of transverse colon: Secondary | ICD-10-CM | POA: Diagnosis not present

## 2023-12-24 DIAGNOSIS — G629 Polyneuropathy, unspecified: Secondary | ICD-10-CM | POA: Insufficient documentation

## 2023-12-24 DIAGNOSIS — I5022 Chronic systolic (congestive) heart failure: Secondary | ICD-10-CM | POA: Diagnosis not present

## 2023-12-24 DIAGNOSIS — Z9221 Personal history of antineoplastic chemotherapy: Secondary | ICD-10-CM | POA: Diagnosis not present

## 2023-12-24 DIAGNOSIS — Z85038 Personal history of other malignant neoplasm of large intestine: Secondary | ICD-10-CM | POA: Insufficient documentation

## 2023-12-24 DIAGNOSIS — Z8616 Personal history of COVID-19: Secondary | ICD-10-CM | POA: Diagnosis not present

## 2023-12-24 DIAGNOSIS — Z8679 Personal history of other diseases of the circulatory system: Secondary | ICD-10-CM | POA: Diagnosis not present

## 2023-12-24 LAB — CEA (ACCESS): CEA (CHCC): 1.47 ng/mL (ref 0.00–5.00)

## 2023-12-24 NOTE — Progress Notes (Signed)
 [facility] OFFICE PROGRESS NOTE   Diagnosis: Colon cancer  INTERVAL HISTORY:   Mr. Glaus returns as scheduled.  He feels well.  No difficulty with bowel function.  No bleeding.  Good appetite.  He reports a "sore throat "for the past few months.  No dysphagia.  He has intermittent nausea after brushing his teeth.  He has chronic infection of the gums.  He was unable to get a ride to the schedule colonoscopy in January.  Objective:  Vital signs in last 24 hours:  Blood pressure 130/71, pulse 92, temperature 98 F (36.7 C), temperature source Temporal, resp. rate 18, height 5\' 11"  (1.803 m), weight 168 lb 4.8 oz (76.3 kg), SpO2 98%.    HEENT: Oropharynx without visible mass, erythema, or exudate Lymphatics: No cervical, supraclavicular, axillary, or inguinal nodes Resp: Distant breath sounds, no respiratory distress Cardio: Regular rate and rhythm GI: No mass, no hepatosplenomegaly, nontender Vascular: No leg edema  Lab Results:  Lab Results  Component Value Date   WBC 13.1 (H) 05/02/2023   HGB 14.2 05/02/2023   HCT 42.6 05/02/2023   MCV 86.2 05/02/2023   PLT 197 05/02/2023   NEUTROABS 2.6 08/18/2022    CMP  Lab Results  Component Value Date   NA 138 06/19/2023   K 3.8 06/19/2023   CL 106 06/19/2023   CO2 27 06/19/2023   GLUCOSE 87 06/19/2023   BUN 15 06/19/2023   CREATININE 1.42 (H) 06/19/2023   CALCIUM  9.1 06/19/2023   PROT 7.4 08/18/2022   ALBUMIN 3.8 08/18/2022   AST 16 08/18/2022   ALT 12 08/18/2022   ALKPHOS 91 08/18/2022   BILITOT 0.5 08/18/2022   GFRNONAA 53 (L) 06/19/2023   GFRAA >60 02/13/2020    Lab Results  Component Value Date   CEA1 1.30 01/22/2021   CEA 1.47 12/24/2023   Medications: I have reviewed the patient's current medications.   Assessment/Plan: Colon cancer, transverse colon, stage IIIc, T4bN2, status post a partial transverse colectomy 06/30/2019 Grade 3, lymphovascular and perineural invasion present, 4/18  lymph nodes positive, positive lymph nodes mesenteric margin, tumor invades into adherent omentum, MSI high, loss of MLH1 and PMS2 expression; MLH1 methylation not detected; BRAF mutation analysis-negative CT abdomen/pelvis 06/26/2019-thickening within the distal transverse colon, severely atrophic and calcified right kidney CT chest 06/28/2019-no evidence of metastatic disease, stable 3 mm right lower lobe nodule Cycle 1 FOLFOX 08/04/2019 (oxaliplatin  65 mg/m due to renal function) Plan for repeat CTs after he has completed 5 cycles of chemotherapy Cycle 2 FOLFOX 08/18/2019 Cycle 3 FOLFOX 08/31/2019, Udenyca  added Cycle 4 FOLFOX 10/12/2019 Cycle 5 FOLFOX 10/25/2019 Stable right lower lobe nodule on chest CT 09/21/2019 CT abdomen/pelvis 11/07/2019-postoperative changes related to colectomy and anastomosis.  No current evidence of disease.  No adenopathy. Cycle 6 FOLFOX 11/08/2019 Cycle 7 FOLFOX 11/23/2019 Cycle 8 FOLFOX 12/06/2019 Cycle 9 FOLFOX 12/20/2019 Case presented GI tumor conference 12/28/2018-tumor margin negative, positive lymph node at margin; no recommendation for further surgery or radiation; peripancreatic node on baseline CT not seen on April 2021 CT Cycle 10 FOLFOX 01/03/2020  Cycle 11 FOLFOX 01/17/2020-oxaliplatin  and Udenyca  held Cycle 12 FOLFOX 01/31/2020-oxaliplatin  and Udenyca  held, 5-FU dose reduced secondary to mucositis 05/25/2020 colonoscopy-internal hemorrhoids, repeat colonoscopy in 3 years for surveillance CTs 06/11/2020-new trace right pelvic fluid, nonspecific; new mild left upper lobe interstitial/groundglass opacity suspicious for minimal infection or inflammation; similar 2 mm right lower lobe pulmonary nodule CTs 05/23/2021-no evidence of recurrent colon cancer CTs 05/30/2022-nonspecific groundglass opacities in the  bilateral upper lobes, new 4 mm groundglass right upper lobe nodule, no evidence of metastatic disease CT chest 09/13/2022-markedly increased upper and mid lung zone  predominant ill-defined centrilobular nodularity.  New 3 mm lateral right lower lobe nodule.  Previously questioned 4 mm groundglass right upper lobe nodule not appreciated. CT chest 03/16/2023-no evidence of metastatic disease, interval resolution of centrilobular groundglass nodules, solid right lower lobe pulmonary nodule which was new on 09/13/2022 has resolved CT abdomen/pelvis 06/19/2023-no locally recurrent or metastatic colon cancer Asthma Chronic systolic heart failure History of PVCs Atrophic right kidney Microcytic anemia, likely iron deficiency anemia secondary to #1 Port-A-Cath placement on 12/15/2019, Dr. 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 Poynter is in clinical remission from colon cancer.  He will return for an office visit and surveillance CTs in 6 months.  He is a candidate for immunotherapy if he develops recurrent disease.  We will ask gastroenterology to reschedule the colonoscopy.  The etiology of the throat soreness is unclear.  I recommend he follow-up with his primary provider if the discomfort persists.  Coni Deep, MD  12/24/2023  12:31 PM

## 2023-12-29 ENCOUNTER — Encounter (HOSPITAL_COMMUNITY): Payer: Self-pay

## 2023-12-29 ENCOUNTER — Ambulatory Visit (HOSPITAL_COMMUNITY)
Admission: EM | Admit: 2023-12-29 | Discharge: 2023-12-29 | Disposition: A | Discharge status: Home / Self Care | Attending: Family Medicine | Admitting: Family Medicine

## 2023-12-29 ENCOUNTER — Ambulatory Visit (INDEPENDENT_AMBULATORY_CARE_PROVIDER_SITE_OTHER)

## 2023-12-29 ENCOUNTER — Other Ambulatory Visit: Payer: Self-pay | Admitting: *Deleted

## 2023-12-29 DIAGNOSIS — R109 Unspecified abdominal pain: Secondary | ICD-10-CM | POA: Diagnosis not present

## 2023-12-29 DIAGNOSIS — C184 Malignant neoplasm of transverse colon: Secondary | ICD-10-CM

## 2023-12-29 DIAGNOSIS — R350 Frequency of micturition: Secondary | ICD-10-CM | POA: Diagnosis not present

## 2023-12-29 LAB — POCT URINALYSIS DIP (MANUAL ENTRY)
Bilirubin, UA: NEGATIVE
Blood, UA: NEGATIVE
Glucose, UA: NEGATIVE mg/dL
Ketones, POC UA: NEGATIVE mg/dL
Leukocytes, UA: NEGATIVE
Nitrite, UA: NEGATIVE
Protein Ur, POC: NEGATIVE mg/dL
Spec Grav, UA: 1.015 (ref 1.010–1.025)
Urobilinogen, UA: 0.2 U/dL
pH, UA: 7 (ref 5.0–8.0)

## 2023-12-29 LAB — POCT FASTING CBG KUC MANUAL ENTRY: POCT Glucose (KUC): 111 mg/dL — AB (ref 70–99)

## 2023-12-29 NOTE — Discharge Instructions (Addendum)
 Your kidney x-ray and bladder was negative for any acute abnormalities.  Your urinalysis was negative for any bacterial acute abnormality.  Your blood glucose while in the office was 111.  There is no indication of infection, kidney stones or diabetes.  I do encourage you to follow-up with your primary care provider for further evaluation.  Overall your exam was negative.  I encourage you to return to all previous medications as directed.

## 2023-12-29 NOTE — ED Triage Notes (Addendum)
 Pt states having urinary frequency since Sunday. States didn't take his fluid pill yesterday and the frequency is worse. C/o lt flank pain and states only has his lt kidney.

## 2023-12-29 NOTE — ED Notes (Signed)
Pt d/c'd by provider prior to VS

## 2023-12-29 NOTE — Progress Notes (Signed)
 Placed new referral for colonoscopy. His transportation canceled on him in January.

## 2023-12-29 NOTE — ED Provider Notes (Signed)
 [facility]    CSN: [id number] Arrival date & time: 12/29/23  0830      History   Chief Complaint Chief Complaint  Patient presents with   Urinary Frequency    HPI Joe Garcia is a 73 y.o. male.   HPI He is in today for evaluation of urinary frequency.  He reports that he does use furosemide  20 mg daily.  He endorses that he has not been using this due to the urinary frequency.  He denies polydipsia or polyphagia. 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  He endorses that he has intermittent left flank pain which he rates a 4 out of 10.  He is currently being treated for colorectal cancer was recently seen by oncology and has a pending CT for 6 months.  He did have a pelvic x-ray in April however there are no results in the system.  He denies hematuria.  He denies any fever, chills, chest pain, nausea, vomiting, diarrhea.  He does have a history of asthma so he does experience shortness of breath.  He reports that he does overuse his albuterol  inhaler and has an upcoming to establish care with pulmonology.   Past Medical History:  Diagnosis Date   Alcoholism (HCC)    hx- hasn't drank since 2014   Allergy    Arthritis    hips, L shoulder   Asthma    Cancer (HCC) 05/2019   colon cancer   CHF (congestive heart failure) (HCC)    Chronic kidney disease    only has one kidney   COPD (chronic obstructive pulmonary disease) (HCC)    COVID-19    Depression    pt denies   Dyspnea    Dysrhythmia    PVC's   GERD (gastroesophageal reflux disease)    Heart murmur    Hyperlipidemia    Hypertension    Myocardial infarction [facility])    pt is unsure of date- believes early 2020   Osteoporosis    Pneumonia    Substance abuse (HCC)    alcoholism over 10 yrs ago    Patient Active Problem List   Diagnosis Date Noted   Postoperative urinary retention 05/02/2023   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  11/24/2018   Chest pain    NSTEMI (non-ST elevated myocardial infarction) (HCC) 10/03/2018   COPD with acute exacerbation (HCC) 07/26/2018   ARF (acute renal failure) (HCC) 07/26/2018   Nausea and vomiting 07/26/2018   Weight loss, unintentional 07/26/2018   Malnutrition of moderate degree 07/16/2017   CKD (chronic kidney disease), stage II 07/14/2017   Solitary kidney, congenital 06/23/2017   Nonischemic cardiomyopathy (HCC)    Multifocal PVCs    Chronic combined systolic (congestive) and diastolic (congestive) heart failure (HCC) 06/11/2017   Hypokalemia 05/05/2017   DOE (dyspnea on exertion) 07/16/2013   Alcohol  abuse 09/25/2012   Alcohol  dependence (HCC) 09/22/2012   Essential hypertension 03/22/2007   DEGENERATIVE DISC DISEASE 03/22/2007  AVASCULAR NECROSIS 03/22/2007    Past Surgical History:  Procedure Laterality Date   BIOPSY  06/27/2019   Procedure: BIOPSY;  Surgeon: Ace Holder, MD;  Location: [facility] ENDOSCOPY;  Service: Gastroenterology;;   CATARACT EXTRACTION     COLONOSCOPY Left 06/27/2019   Procedure: COLONOSCOPY;  Surgeon: Ace Holder, MD;  Location: [facility] ENDOSCOPY;  Service: Gastroenterology;  Laterality: Left;   COLONOSCOPY     LAPAROTOMY N/A 06/30/2019   Procedure: EXPLORATORY LAPAROTOMY;  Surgeon: Shela Derby, MD;  Location: [facility] OR;  Service: General;  Laterality: N/A;   LEFT HEART CATH AND CORONARY ANGIOGRAPHY N/A 10/04/2018   Procedure: LEFT HEART CATH AND CORONARY ANGIOGRAPHY;  Surgeon: Lucendia Rusk, MD;  Location: [facility] INVASIVE CV LAB;  Service: Cardiovascular;  Laterality: N/A;   PARTIAL COLECTOMY N/A 06/30/2019   Procedure: PARTIAL COLECTOMY;  Surgeon: Shela Derby, MD;  Location: [facility] OR;  Service: General;  Laterality: N/A;   PORTACATH PLACEMENT Left 08/02/2019   Procedure: INSERTION PORT-A-CATH LEFT CHEST;  Surgeon: Shela Derby, MD;  Location: [facility] OR;  Service: General;  Laterality: Left;   RIGHT/LEFT HEART CATH AND CORONARY ANGIOGRAPHY N/A 06/12/2017   Procedure: RIGHT/LEFT HEART CATH AND CORONARY ANGIOGRAPHY;  Surgeon: Mardell Shade, MD;  Location: [facility] INVASIVE CV LAB;  Service: Cardiovascular;  Laterality: N/A;   TOTAL HIP ARTHROPLASTY Right 05/01/2023   Procedure: TOTAL HIP ARTHROPLASTY ANTERIOR APPROACH;  Surgeon: Wes Hamman, MD;  Location: [facility] OR;  Service: Orthopedics;  Laterality: Right;       Home Medications    Prior to Admission medications   Medication Sig Start Date End Date Taking? Authorizing Provider  albuterol  (VENTOLIN  HFA) 108 (90 Base) MCG/ACT inhaler Inhale 2 puffs into the lungs every 4 (four) hours as needed for wheezing or shortness of breath. 07/09/20   Rodriguez-Southworth, Sylvia, PA-C  amiodarone  (PACERONE ) 200 MG tablet Take 0.5 tablets (100 mg total) by mouth every morning. 12/09/23   Avanell Leigh, MD  amLODipine  (NORVASC ) 5 MG tablet Take 1 tablet (5 mg total) by mouth every morning. 12/09/23   Avanell Leigh, MD  atorvastatin (LIPITOR) 10 MG tablet Take 10 mg by mouth at bedtime.    [provider]  budesonide -formoterol  (SYMBICORT ) 160-4.5 MCG/ACT inhaler Inhale 2 puffs into the lungs 2 (two) times daily. 11/23/18   Diamond Formica, MD  carvedilol  (COREG ) 3.125 MG tablet Take 1 tablet (3.125 mg total) by mouth 2 (two) times daily with a meal. 12/09/23   Avanell Leigh, MD  fluticasone  (FLONASE ) 50 MCG/ACT nasal spray Place 2 sprays into both nostrils daily as needed for allergies or rhinitis. Patient not taking: Reported on  12/24/2023 02/13/20   Audria Leather, MD  furosemide  (LASIX ) 20 MG tablet Take 1 tablet (20 mg total) by mouth every morning. 12/09/23   Avanell Leigh, MD  ipratropium-albuterol  (DUONEB) 0.5-2.5 (3) MG/3ML SOLN Take 3 mLs by nebulization every 4 (four) hours as needed. 08/01/22   Crain, Whitney L, PA  isosorbide -hydrALAZINE  (BIDIL ) 20-37.5 MG tablet Take 1 tablet by mouth in the morning, at noon, and at bedtime. 12/09/23   Avanell Leigh, MD  losartan  (COZAAR ) 50 MG tablet Take 1 tablet (50 mg total) by mouth every morning. 12/09/23   Avanell Leigh, MD  montelukast  (SINGULAIR ) 10 MG tablet Take 10 mg by mouth at bedtime.    [provider]  tamsulosin  (FLOMAX ) 0.4 MG CAPS capsule Take 0.4 mg by mouth daily after supper. 05/26/23   [provider]  TRELEGY ELLIPTA 100-62.5-25 MCG/ACT AEPB Inhale 1 puff into the lungs daily. Patient not taking: Reported on 06/23/2023 09/11/22   [provider]    Family History Family History  Problem Relation Age of Onset   Heart failure Mother    Kidney failure Mother    Heart disease Father    Heart disease Daughter        "fluid around heart"    Canavan disease Daughter    Cancer Daughter        unsure of type   Asthma Son    Colon cancer Neg Hx    Esophageal cancer Neg Hx    Rectal cancer Neg Hx    Stomach cancer Neg Hx    Colon polyps Neg Hx     Social History Social History   Tobacco Use   Smoking status: Former    Current packs/day: 0.00    Average packs/day: 0.1 packs/day for 45.0 years (4.5 ttl pk-yrs)    Types: Cigarettes    Start date: 86    Quit date: 2012    Years since quitting: 13.4   Smokeless tobacco: Never  Vaping Use   Vaping status: Never Used  Substance Use Topics   Alcohol  use: No    Comment: Pt states no etoh since April 2014   Drug use: Not Currently    Comment: Prior use of crack cocaine, quit 2012     Allergies   Clarithromycin  and Lisinopril    Review of Systems Review  of Systems   Physical Exam Triage Vital Signs ED Triage Vitals [12/29/23 0852]  Encounter Vitals Group     BP (!) 160/84     Systolic BP Percentile      Diastolic BP Percentile      Pulse Rate 64     Resp 18     Temp 98.2 F (36.8 C)     Temp Source Oral     SpO2 98 %     Weight      Height      Head Circumference      Peak Flow      Pain Score 4     Pain Loc      Pain Education      Exclude from Growth Chart    No data found.  Updated Vital Signs BP (!) 160/84 (BP Location: Right Arm)   Pulse 64   Temp 98.2 F (36.8 C) (Oral)   Resp 18   SpO2 98%   Visual Acuity Right Eye Distance:   Left Eye Distance:   Bilateral Distance:    Right Eye Near:   Left Eye Near:    Bilateral Near:     Physical Exam Constitutional:      General: He is not in acute distress.    Appearance: He is normal weight.  HENT:     Head: Normocephalic and atraumatic.     Nose: Nose normal.     Mouth/Throat:     Mouth: Mucous membranes are moist.  Cardiovascular:     Rate and Rhythm: Normal rate and regular rhythm.     Pulses: Normal pulses.     Heart sounds: Normal heart sounds.  Pulmonary:     Effort: Pulmonary effort is normal.     Breath sounds: Wheezing present.  Musculoskeletal:        General: Normal range of motion.     Cervical back: Normal range of motion.     Comments: Cane in use  Skin:    General: Skin is warm and dry.     Capillary Refill: Capillary refill takes less than 2 seconds.  Neurological:     General: No focal deficit present.     Mental Status: He is alert.  Psychiatric:        Mood and Affect: Mood normal.        Behavior: Behavior normal.      UC Treatments / Results  Labs (all labs ordered are listed, but only abnormal results are displayed) Labs Reviewed  POCT FASTING CBG [facility] MANUAL ENTRY - Abnormal; Notable for the following components:      Result Value   POCT Glucose ([facility]) 111 (*)    All other components within normal limits  POCT  URINALYSIS DIP (MANUAL ENTRY)    EKG   Radiology DG Abdomen 1 View Result Date: 12/29/2023 CLINICAL DATA:  Left flank pain with urinary frequency EXAM: ABDOMEN - 1 VIEW COMPARISON:  CT abdomen and pelvis November 22 24 FINDINGS: No abnormal bowel gas pattern or obstruction Calcifications in the right upper quadrant correlate with calcification adjacent to the right psoas muscle secondary to probably calcified retroperitoneal hematoma. Status post total right hip arthroplasty with advanced osteoarthrosis of the left hip IMPRESSION: Nonobstructive bowel gas pattern. Electronically Signed   By: Fredrich Jefferson M.D.   On: 12/29/2023 10:03    Procedures Procedures (including critical care time)  Medications Ordered in UC Medications - No data to display  Initial Impression / Assessment and Plan / UC Course  I have reviewed the triage vital signs and the nursing notes.  Pertinent labs & imaging results that were available during my care of the patient were reviewed by me and considered in my medical decision making (see chart for details).     Urinary frequency Final Clinical Impressions(s) / UC Diagnoses   Final diagnoses:  Acute left flank pain  Urinary frequency     Discharge Instructions      Your kidney x-ray and bladder was negative for any acute abnormalities.  Your urinalysis was negative for any bacterial acute abnormality.  Your blood glucose while in the office was 111.  There is no indication of infection, kidney stones or diabetes.  I do encourage you to follow-up with your primary care provider for further evaluation.  Overall your exam was negative.  I encourage you to return to all previous medications as directed.   ED Prescriptions   None    PDMP not reviewed this encounter.   Eleanore Grey [place], [place] 12/29/23 1030

## 2024-01-12 ENCOUNTER — Encounter: Payer: Self-pay | Admitting: Cardiovascular Disease

## 2024-01-12 ENCOUNTER — Ambulatory Visit: Attending: Cardiovascular Disease | Admitting: Cardiovascular Disease

## 2024-01-12 VITALS — BP 110/72 | HR 68 | Ht 71.0 in | Wt 172.8 lb

## 2024-01-12 DIAGNOSIS — F1010 Alcohol abuse, uncomplicated: Secondary | ICD-10-CM

## 2024-01-12 DIAGNOSIS — I10 Essential (primary) hypertension: Secondary | ICD-10-CM | POA: Diagnosis not present

## 2024-01-12 DIAGNOSIS — E782 Mixed hyperlipidemia: Secondary | ICD-10-CM

## 2024-01-12 NOTE — Progress Notes (Unsigned)
 01/12/2024 Joe Garcia   1950/09/10  [id number]  Primary Physician Maryellen Snare, NP Primary Cardiologist: Avanell Leigh MD Bennye Bravo, [place]  HPI:  Joe Garcia is a 73 y.o.   thin appearing single African-American male father of 8 children, grandfather to 7 grandchildren who is retired from Restaurant manager, fast food work. I last saw him in the hospital 1//23 when he was admitted with a non-STEMI. He underwent cardiac catheterization by Dr. Jacquelynn Matter revealing clean coronary arteries and LV dysfunction consistent with Takotsubo syndrome. He had had nonischemic cardiomyopathy in the past and was followed by Dr. Julane Ny. His most recent 2D echo performed 09/20/2020 revealed normalization of his LV function. Other problems include treated hypertension. He smoked remotely as well as drank alcohol  remotely all of which she stopped.    Since I saw him in the office a year and a half ago he continues to do well.  He denies chest pain or shortness of breath.  He did have a right total hip replacement 05/01/23 and apparently needs a left total hip replacement in the near future.  His last 2D echo performed 09/16/2020 revealed EF of 55 to 60% with grade 1 diastolic dysfunction of mild MR.   Current Meds  Medication Sig   albuterol  (VENTOLIN  HFA) 108 (90 Base) MCG/ACT inhaler Inhale 2 puffs into the lungs every 4 (four) hours as needed for wheezing or shortness of breath.   amiodarone  (PACERONE ) 200 MG tablet Take 0.5 tablets (100 mg total) by mouth every morning.   amLODipine  (NORVASC ) 5 MG tablet Take 1 tablet (5 mg total) by mouth every morning.   atorvastatin (LIPITOR) 10 MG tablet Take 10 mg by mouth at bedtime.   budesonide -formoterol  (SYMBICORT ) 160-4.5 MCG/ACT inhaler Inhale 2 puffs into the lungs 2 (two) times daily.   carvedilol  (COREG ) 3.125 MG tablet Take 1 tablet (3.125 mg total) by mouth 2 (two) times daily with a meal.   fluticasone  (FLONASE ) 50 MCG/ACT nasal spray Place 2  sprays into both nostrils daily as needed for allergies or rhinitis.   furosemide  (LASIX ) 20 MG tablet Take 1 tablet (20 mg total) by mouth every morning.   ipratropium-albuterol  (DUONEB) 0.5-2.5 (3) MG/3ML SOLN Take 3 mLs by nebulization every 4 (four) hours as needed.   isosorbide -hydrALAZINE  (BIDIL ) 20-37.5 MG tablet Take 1 tablet by mouth in the morning, at noon, and at bedtime.   losartan  (COZAAR ) 50 MG tablet Take 1 tablet (50 mg total) by mouth every morning.   montelukast  (SINGULAIR ) 10 MG tablet Take 10 mg by mouth at bedtime.   tamsulosin  (FLOMAX ) 0.4 MG CAPS capsule Take 0.4 mg by mouth daily after supper.     Allergies  Allergen Reactions   Clarithromycin  Shortness Of Breath   Lisinopril  Swelling    Facial and tongue swelling 10/2012    Social History   Socioeconomic History   Marital status: Single    Spouse name: Not on file   Number of children: 8   Years of education: Not on file   Highest education level: Not on file  Occupational History   Occupation: it don't work for me, on disability  Tobacco Use   Smoking status: Former    Current packs/day: 0.00    Average packs/day: 0.1 packs/day for 45.0 years (4.5 ttl pk-yrs)    Types: Cigarettes    Start date: 7    Quit date: 2012    Years since quitting: 13.4   Smokeless tobacco: Never  Vaping  Use   Vaping status: Never Used  Substance and Sexual Activity   Alcohol  use: No    Comment: Pt states no etoh since April 2014   Drug use: Not Currently    Comment: Prior use of crack cocaine, quit 2012   Sexual activity: Never  Other Topics Concern   Not on file  Social History Narrative   Not on file   Social Drivers of Health   Financial Resource Strain: Not on File (04/21/2022)   Received from [employer]    Financial Resource Strain: 0  Food Insecurity: Not on File (04/21/2022)   Received from [facility] Insecurity    Food: 0  Transportation Needs: Not on File (04/21/2022)    Received from [employer] Needs    Transportation: 0  Physical Activity: Not on File (11/14/2021)   Received from [facility]   Physical Activity    Physical Activity: 0  Stress: Not on File (11/14/2021)   Received from [facility]   Stress    Stress: 0  Social Connections: Not on File (04/06/2023)   Received from [employer]   Social Connections    Connectedness: 0  Intimate Partner Violence: Not on file     Review of Systems: General: negative for chills, fever, night sweats or weight changes.  Cardiovascular: negative for chest pain, dyspnea on exertion, edema, orthopnea, palpitations, paroxysmal nocturnal dyspnea or shortness of breath Dermatological: negative for rash Respiratory: negative for cough or wheezing Urologic: negative for hematuria Abdominal: negative for nausea, vomiting, diarrhea, bright red blood per rectum, melena, or hematemesis Neurologic: negative for visual changes, syncope, or dizziness All other systems reviewed and are otherwise negative except as noted above.    Blood pressure 110/72, pulse 68, height 5' 11 (1.803 m), weight 172 lb 12.8 oz (78.4 kg), SpO2 98%.  General appearance: alert and no distress Neck: no adenopathy, no carotid bruit, no JVD, supple, symmetrical, trachea midline, and thyroid  not enlarged, symmetric, no tenderness/mass/nodules Lungs: clear to auscultation bilaterally Heart: regular rate and rhythm, S1, S2 normal, no murmur, click, rub or gallop Extremities: extremities normal, atraumatic, no cyanosis or edema Pulses: 2+ and symmetric Skin: Skin color, texture, turgor normal. No rashes or lesions Neurologic: Grossly normal  EKG EKG Interpretation Date/Time:  Tuesday January 12 2024 15:12:28 EDT Ventricular Rate:  61 PR Interval:  160 QRS Duration:  72 QT Interval:  424 QTC Calculation: 426 R Axis:   -34  Text Interpretation: Normal sinus rhythm Left axis deviation Nonspecific ST abnormality When compared with ECG of 10-Sep-2020  10:24, Premature ventricular complexes are no longer Present Confirmed by Lauro Portal [phone]) on 01/12/2024 3:16:10 PM    ASSESSMENT AND PLAN:   Essential hypertension History of essential hypertension blood pressure measured today 110/72.  He is on amlodipine , carvedilol  and losartan .  Alcohol  abuse Please continue to 11 years ago  Chronic combined systolic (congestive) and diastolic (congestive) heart failure (HCC) History of combined systolic and diastolic heart failure probably related to Takotsubo syndrome.  His last 2D echo performed 09/16/2020 revealed normal LV systolic function with grade 1 diastolic dysfunction and mild MR.  He denies chest pain or shortness of breath.  He is on GDMT.  Hyperlipidemia History of hyperlipidemia on statin therapy.  Will recheck a lipid liver profile.     Avanell Leigh MD FACP,FACC,FAHA, [facility] 01/12/2024 3:26 PM

## 2024-01-12 NOTE — Assessment & Plan Note (Signed)
 History of essential hypertension blood pressure measured today 110/72.  He is on amlodipine , carvedilol  and losartan .

## 2024-01-12 NOTE — Assessment & Plan Note (Signed)
 History of combined systolic and diastolic heart failure probably related to Takotsubo syndrome.  His last 2D echo performed 09/16/2020 revealed normal LV systolic function with grade 1 diastolic dysfunction and mild MR.  He denies chest pain or shortness of breath.  He is on GDMT.

## 2024-01-12 NOTE — Assessment & Plan Note (Signed)
History of hyperlipidemia on statin therapy.  Will recheck a lipid liver profile 

## 2024-01-12 NOTE — Assessment & Plan Note (Signed)
 Please continue to 11 years ago

## 2024-01-12 NOTE — Patient Instructions (Addendum)
 Medication Instructions:  Your physician recommends that you continue on your current medications as directed. Please refer to the Current Medication list given to you today.  *If you need a refill on your cardiac medications before your next appointment, please call your pharmacy*   Lab Work: Your physician recommends that you return for lab work in: the next week or 2 for FASTING lipid/liver panel  If you have labs (blood work) drawn today and your tests are completely normal, you will receive your results only by: MyChart Message (if you have MyChart) OR A paper copy in the mail If you have any lab test that is abnormal or we need to change your treatment, we will call you to review the results.   Follow-Up: At [facility], you and your health needs are our priority.  As part of our continuing mission to provide you with exceptional heart care, our providers are all part of one team.  This team includes your primary Cardiologist (physician) and Advanced Practice Providers or APPs (Physician Assistants and Nurse Practitioners) who all work together to provide you with the care you need, when you need it.  Your next appointment:   12 month(s)  Provider:   Lauro Portal, MD    We recommend signing up for the patient portal called MyChart.  Sign up information is provided on this After Visit Summary.  MyChart is used to connect with patients for Virtual Visits (Telemedicine).  Patients are able to view lab/test results, encounter notes, upcoming appointments, etc.  Non-urgent messages can be sent to your provider as well.   To learn more about what you can do with MyChart, go to [email or web address].

## 2024-02-01 ENCOUNTER — Other Ambulatory Visit: Payer: Self-pay | Admitting: Cardiovascular Disease

## 2024-03-03 ENCOUNTER — Other Ambulatory Visit: Payer: Self-pay | Admitting: Cardiovascular Disease

## 2024-03-15 ENCOUNTER — Telehealth: Payer: Self-pay | Admitting: *Deleted

## 2024-03-15 NOTE — Telephone Encounter (Signed)
-----   Message from Elspeth SHAUNNA Naval sent at 03/14/2024  6:05 PM EDT ----- Regarding: RE: Colonoscopy Can direct book him at the [facility] if he is willing - it appears he had a ride issue with his last exam. He neeeds to make sure transportation is free to take him if he is going to schedule again. Thanks  SA ----- Message ----- From: Claudene Naomie SAILOR, RN Sent: 03/14/2024   8:38 AM EDT To: Elspeth SHAUNNA Naval, MD Subject: FW: Colonoscopy                                Dr A-  OK to schedule patient for direct colon in [facility] with previsit prior? Looks like he no showed/late cancelled to previous colonoscopy in January and did not reschedule. ----- Message ----- From: Mercer Cristino SAILOR, RN Sent: 03/14/2024   8:03 AM EDT To: Devere LITTIE Leaven, RN; [facility] A Triage Subject: RE: Colonoscopy                                Hi Devere,  I am no longer Dr. Hassan nurse. I will send to his nurses Dottie, RN and Rock, Charity fundraiser.  Thank you ----- Message ----- From: Leaven Devere LITTIE, RN Sent: 03/11/2024   4:18 PM EDT To: Cristino SAILOR Mercer, RN Subject: Colonoscopy                                    Hi [place], I know he was a no show for his January 2025 colonoscopy. Dr. Cloretta asking to f/u on status of reschedule on this w/Dr. Naval. Will you look into this for us  please. Thank you

## 2024-03-15 NOTE — Telephone Encounter (Signed)
 I have spoken to patient who has rescheduled colonoscopy to 04/13/24 and has scheduled in person previsit on 03/31/24. He is reminded he will need a care partner 18 years or older to bring him, stay for his procedure and drive him home on 0/82/74 due to sedation used. He verbalizes understanding of this information.

## 2024-03-31 ENCOUNTER — Ambulatory Visit (AMBULATORY_SURGERY_CENTER)

## 2024-03-31 ENCOUNTER — Other Ambulatory Visit: Payer: Self-pay

## 2024-03-31 VITALS — Ht 71.0 in | Wt 168.0 lb

## 2024-03-31 DIAGNOSIS — Z85038 Personal history of other malignant neoplasm of large intestine: Secondary | ICD-10-CM

## 2024-03-31 MED ORDER — NA SULFATE-K SULFATE-MG SULF 17.5-3.13-1.6 GM/177ML PO SOLN: 1.0000 | Freq: Once | ORAL | 0 refills | Status: AC

## 2024-03-31 NOTE — Progress Notes (Signed)
 Denies allergies to eggs or soy products. Denies complication of anesthesia or sedation. Denies use of weight loss medication. Denies use of O2.   Emmi instructions given for colonoscopy.

## 2024-04-13 ENCOUNTER — Encounter: Payer: Self-pay | Admitting: Gastroenterology

## 2024-04-13 ENCOUNTER — Ambulatory Visit (AMBULATORY_SURGERY_CENTER): Admitting: Gastroenterology

## 2024-04-13 VITALS — BP 116/61 | HR 57 | Temp 97.2°F | Resp 10 | Ht 71.0 in | Wt 168.0 lb

## 2024-04-13 DIAGNOSIS — Z1211 Encounter for screening for malignant neoplasm of colon: Secondary | ICD-10-CM

## 2024-04-13 DIAGNOSIS — K648 Other hemorrhoids: Secondary | ICD-10-CM | POA: Diagnosis not present

## 2024-04-13 DIAGNOSIS — Z85038 Personal history of other malignant neoplasm of large intestine: Secondary | ICD-10-CM | POA: Diagnosis not present

## 2024-04-13 DIAGNOSIS — Z980 Intestinal bypass and anastomosis status: Secondary | ICD-10-CM

## 2024-04-13 DIAGNOSIS — D123 Benign neoplasm of transverse colon: Secondary | ICD-10-CM

## 2024-04-13 MED ORDER — SODIUM CHLORIDE 0.9 % IV SOLN: 500.0000 mL | Freq: Once | INTRAVENOUS | Status: DC

## 2024-04-13 NOTE — Op Note (Signed)
 [facility] Patient Name: Joe Garcia Procedure Date: 04/13/2024 2:13 PM MRN: [id number] Endoscopist: Elspeth P. 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 The patient's tolerance of                            previous anesthesia was also reviewed. The risks                            and benefits of the procedure and the sedation                            options and risks were discussed with the patient.                            All questions were answered, and informed consent                            was obtained. Prior Anticoagulants: The patient has                            taken no anticoagulant or antiplatelet agents. ASA                            Grade Assessment: III - A patient with severe                            systemic disease. After reviewing the risks and                            benefits, the patient was deemed in satisfactory  condition to undergo the procedure.                           After obtaining informed consent, the colonoscope                            was passed under direct vision. Throughout the                            procedure, the patient's blood pressure, pulse, and                            oxygen  saturations were monitored continuously. The                            Olympus Scope SN  [id number] was introduced through the                            anus and advanced to the the cecum, identified by                            appendiceal orifice and ileocecal valve. The                            colonoscopy was performed without difficulty. The                            patient tolerated the procedure well. The quality                            of the bowel preparation was good. The ileocecal                            valve, appendiceal orifice, and rectum were                            photographed. Scope In: 2:20:42 PM Scope Out: 2:32:48 PM Scope Withdrawal Time: 0 hours 9 minutes 24 seconds  Total Procedure Duration: 0 hours 12 minutes 6 seconds  Findings:                 The perianal and digital rectal examinations were                            normal.                           A 3 mm polyp was found in the transverse colon. The                            polyp was flat. The polyp was removed with a cold                            snare. Resection and retrieval were complete.  There was evidence of a prior end-to-end                            colo-colonic anastomosis in the transverse colon.                            This was patent and was characterized by healthy                            appearing mucosa.                           Internal hemorrhoids were found during retroflexion.                           The exam was otherwise without abnormality. Complications:            No immediate complications. Estimated blood loss:                            Minimal. Estimated Blood Loss:     Estimated blood loss was minimal. Impression:               - One 3 mm polyp in the transverse colon, removed                            with a cold snare. Resected and retrieved.                           - Patent end-to-end colo-colonic anastomosis,                            characterized by healthy appearing mucosa.                           -  Internal hemorrhoids.                           - The examination was otherwise normal. Recommendation:           - Patient has a contact number available for                            emergencies. The signs and symptoms of potential                            delayed complications were discussed with the                            patient. Return to normal activities tomorrow.                            Written discharge instructions were provided to the                            patient.                           -  Resume previous diet.                           - Continue present medications.                           - Await pathology results.                           - Repeat colonoscopy in 5 years for surveillance. Elspeth P. Joe Gafford, MD 04/13/2024 2:36:34 PM This report has been signed electronically.

## 2024-04-13 NOTE — Patient Instructions (Signed)
 Resume previous diet. Continue present medications. Awaiting pathology results. Repeat colonoscopy in 5 years for surveillance.  Handouts provided on polyps and hemorrhoids.   YOU HAD AN ENDOSCOPIC PROCEDURE TODAY AT THE [facility]:   Refer to the procedure report that was given to you for any specific questions about what was found during the examination.  If the procedure report does not answer your questions, please call your gastroenterologist to clarify.  If you requested that your care partner not be given the details of your procedure findings, then the procedure report has been included in a sealed envelope for you to review at your convenience later.  YOU SHOULD EXPECT: Some feelings of bloating in the abdomen. Passage of more gas than usual.  Walking can help get rid of the air that was put into your GI tract during the procedure and reduce the bloating. If you had a lower endoscopy (such as a colonoscopy or flexible sigmoidoscopy) you may notice spotting of blood in your stool or on the toilet paper. If you underwent a bowel prep for your procedure, you may not have a normal bowel movement for a few days.  Please Note:  You might notice some irritation and congestion in your nose or some drainage.  This is from the oxygen used during your procedure.  There is no need for concern and it should clear up in a day or so.  SYMPTOMS TO REPORT IMMEDIATELY:  Following lower endoscopy (colonoscopy or flexible sigmoidoscopy):  Excessive amounts of blood in the stool  Significant tenderness or worsening of abdominal pains  Swelling of the abdomen that is new, acute  Fever of 100F or higher  For urgent or emergent issues, a gastroenterologist can be reached at any hour by calling [phone]. Do not use MyChart messaging for urgent concerns.    DIET:  We do recommend a small meal at first, but then you may proceed to your regular diet.  Drink plenty of fluids but you should  avoid alcoholic beverages for 24 hours.  ACTIVITY:  You should plan to take it easy for the rest of today and you should NOT DRIVE or use heavy machinery until tomorrow (because of the sedation medicines used during the test).    FOLLOW UP: Our staff will call the number listed on your records the next business day following your procedure.  We will call around 7:15- 8:00 am to check on you and address any questions or concerns that you may have regarding the information given to you following your procedure. If we do not reach you, we will leave a message.     If any biopsies were taken you will be contacted by phone or by letter within the next 1-3 weeks.  Please call us  at [phone] if you have not heard about the biopsies in 3 weeks.    SIGNATURES/CONFIDENTIALITY: You and/or your care partner have signed paperwork which will be entered into your electronic medical record.  These signatures attest to the fact that that the information above on your After Visit Summary has been reviewed and is understood.  Full responsibility of the confidentiality of this discharge information lies with you and/or your care-partner.

## 2024-04-13 NOTE — Progress Notes (Signed)
 Called to room to assist during endoscopic procedure.  Patient ID and intended procedure confirmed with present staff. Received instructions for my participation in the procedure from the performing physician.

## 2024-04-13 NOTE — Progress Notes (Signed)
 To PACU via stretcher, sedated, good respiratory effort, VSS.

## 2024-04-13 NOTE — Progress Notes (Signed)
 [facility] History and Physical   Primary Care Physician:  Campbell Reynolds, NP   Reason for Procedure:   History of colon cancer  Plan:    colonoscopy     HPI: Joe Garcia is a 73 y.o. male  here for colonoscopy surveillance - had colon cancer 05/2019 s/p resection. Last exam 04/2020 without polyps.   Patient denies any bowel symptoms at this time.  Otherwise feels well without any cardiopulmonary symptoms.   I have discussed risks / benefits of anesthesia and endoscopic procedure with Joe Garcia and they wish to proceed with the exams as outlined today.    Past Medical History:  Diagnosis Date   Alcoholism (HCC)    hx- hasn't drank since 2014   Allergy    Arthritis    hips, L shoulder   Asthma    Cancer (HCC) 05/2019   colon cancer   Cataract    CHF (congestive heart failure) (HCC)    Chronic kidney disease    only has one kidney   COPD (chronic obstructive pulmonary disease) (HCC)    COVID-19    Depression    pt denies   Dyspnea    Dysrhythmia    PVC's   GERD (gastroesophageal reflux disease)    Heart murmur    Hyperlipidemia    Hypertension    Myocardial infarction [facility])    pt is unsure of date- believes early 2020   Osteoporosis    Pneumonia    Substance abuse (HCC)    alcoholism over 10 yrs ago    Past Surgical History:  Procedure Laterality Date   BIOPSY  06/27/2019   Procedure: BIOPSY;  Surgeon: Leigh Elspeth SQUIBB, MD;  Location: [facility] ENDOSCOPY;  Service: Gastroenterology;;   CATARACT EXTRACTION     COLONOSCOPY Left 06/27/2019   Procedure: COLONOSCOPY;  Surgeon: Leigh Elspeth SQUIBB, MD;  Location: [facility] ENDOSCOPY;  Service: Gastroenterology;  Laterality: Left;   COLONOSCOPY     LAPAROTOMY N/A 06/30/2019   Procedure: EXPLORATORY LAPAROTOMY;  Surgeon: Rubin Calamity, MD;  Location: [facility] OR;  Service: General;  Laterality: N/A;   LEFT HEART CATH AND CORONARY ANGIOGRAPHY N/A 10/04/2018   Procedure: LEFT HEART CATH AND CORONARY  ANGIOGRAPHY;  Surgeon: Dann Candyce RAMAN, MD;  Location: [facility] INVASIVE CV LAB;  Service: Cardiovascular;  Laterality: N/A;   PARTIAL COLECTOMY N/A 06/30/2019   Procedure: PARTIAL COLECTOMY;  Surgeon: Rubin Calamity, MD;  Location: [facility] OR;  Service: General;  Laterality: N/A;   PORTACATH PLACEMENT Left 08/02/2019   Procedure: INSERTION PORT-A-CATH LEFT CHEST;  Surgeon: Rubin Calamity, MD;  Location: [facility] OR;  Service: General;  Laterality: Left;   RIGHT/LEFT HEART CATH AND CORONARY ANGIOGRAPHY N/A 06/12/2017   Procedure: RIGHT/LEFT HEART CATH AND CORONARY ANGIOGRAPHY;  Surgeon: Cherrie Toribio SAUNDERS, MD;  Location: [facility] INVASIVE CV LAB;  Service: Cardiovascular;  Laterality: N/A;   TOTAL HIP ARTHROPLASTY Right 05/01/2023   Procedure: TOTAL HIP ARTHROPLASTY ANTERIOR APPROACH;  Surgeon: Jerri Kay HERO, MD;  Location: [facility] OR;  Service: Orthopedics;  Laterality: Right;    Prior to Admission medications   Medication Sig Start Date End Date Taking? Authorizing Provider  albuterol  (VENTOLIN  HFA) 108 (90 Base) MCG/ACT inhaler Inhale 2 puffs into the lungs every 4 (four) hours as needed for wheezing or shortness of breath. 07/09/20  Yes Rodriguez-Southworth, Sylvia, PA-C  amiodarone  (PACERONE ) 200 MG tablet TAKE 1/2 TABLET (100 MG TOTAL) BY MOUTH EVERY MORNING. 02/04/24  Yes Court Dorn PARAS, MD  amLODipine  (NORVASC ) 5 MG tablet  TAKE 1 TABLET (5 MG TOTAL) BY MOUTH EVERY MORNING. 03/03/24  Yes Court Dorn PARAS, MD  atorvastatin (LIPITOR) 10 MG tablet Take 10 mg by mouth at bedtime.   Yes [provider]  budesonide -formoterol  (SYMBICORT ) 160-4.5 MCG/ACT inhaler Inhale 2 puffs into the lungs 2 (two) times daily. 11/23/18  Yes Darlean Ozell NOVAK, MD  carvedilol  (COREG ) 3.125 MG tablet Take 1 tablet (3.125 mg total) by mouth 2 (two) times daily with a meal. 12/09/23  Yes Court Dorn PARAS, MD  furosemide  (LASIX ) 20 MG tablet TAKE 1 TABLET (20 MG TOTAL) BY MOUTH EVERY MORNING. 02/04/24  Yes Court Dorn PARAS, MD   isosorbide -hydrALAZINE  (BIDIL ) 20-37.5 MG tablet TAKE 1 TABLET BY MOUTH IN THE MORNING, AT NOON, AND AT BEDTIME. 02/04/24  Yes Court Dorn PARAS, MD  levocetirizine (XYZAL) 5 MG tablet Take 5 mg by mouth daily. 04/04/24  Yes [provider]  losartan  (COZAAR ) 50 MG tablet Take 1 tablet (50 mg total) by mouth every morning. 12/09/23  Yes Court Dorn PARAS, MD  montelukast  (SINGULAIR ) 10 MG tablet Take 10 mg by mouth at bedtime.   Yes [provider]  tamsulosin  (FLOMAX ) 0.4 MG CAPS capsule Take 0.4 mg by mouth daily after supper. 05/26/23  Yes [provider]  fluticasone  (FLONASE ) 50 MCG/ACT nasal spray Place 2 sprays into both nostrils daily as needed for allergies or rhinitis. 02/13/20   Cheryle Page, MD  ipratropium-albuterol  (DUONEB) 0.5-2.5 (3) MG/3ML SOLN Take 3 mLs by nebulization every 4 (four) hours as needed. 08/01/22   Crain, Whitney L, PA  TRELEGY ELLIPTA 100-62.5-25 MCG/ACT AEPB Inhale 1 puff into the lungs daily. Patient not taking: Reported on 03/31/2024 09/11/22   [provider]    Current Outpatient Medications  Medication Sig Dispense Refill   albuterol  (VENTOLIN  HFA) 108 (90 Base) MCG/ACT inhaler Inhale 2 puffs into the lungs every 4 (four) hours as needed for wheezing or shortness of breath. 18 g 0   amiodarone  (PACERONE ) 200 MG tablet TAKE 1/2 TABLET (100 MG TOTAL) BY MOUTH EVERY MORNING. 30 tablet 3   amLODipine  (NORVASC ) 5 MG tablet TAKE 1 TABLET (5 MG TOTAL) BY MOUTH EVERY MORNING. 90 tablet 3   atorvastatin (LIPITOR) 10 MG tablet Take 10 mg by mouth at bedtime.     budesonide -formoterol  (SYMBICORT ) 160-4.5 MCG/ACT inhaler Inhale 2 puffs into the lungs 2 (two) times daily. 1 Inhaler 11   carvedilol  (COREG ) 3.125 MG tablet Take 1 tablet (3.125 mg total) by mouth 2 (two) times daily with a meal. 180 tablet 0   furosemide  (LASIX ) 20 MG tablet TAKE 1 TABLET (20 MG TOTAL) BY MOUTH EVERY MORNING. 90 tablet 3   isosorbide -hydrALAZINE  (BIDIL ) 20-37.5  MG tablet TAKE 1 TABLET BY MOUTH IN THE MORNING, AT NOON, AND AT BEDTIME. 270 tablet 3   levocetirizine (XYZAL) 5 MG tablet Take 5 mg by mouth daily.     losartan  (COZAAR ) 50 MG tablet Take 1 tablet (50 mg total) by mouth every morning. 90 tablet 0   montelukast  (SINGULAIR ) 10 MG tablet Take 10 mg by mouth at bedtime.     tamsulosin  (FLOMAX ) 0.4 MG CAPS capsule Take 0.4 mg by mouth daily after supper.     fluticasone  (FLONASE ) 50 MCG/ACT nasal spray Place 2 sprays into both nostrils daily as needed for allergies or rhinitis.     ipratropium-albuterol  (DUONEB) 0.5-2.5 (3) MG/3ML SOLN Take 3 mLs by nebulization every 4 (four) hours as needed. 120 mL 0   TRELEGY ELLIPTA 100-62.5-25  MCG/ACT AEPB Inhale 1 puff into the lungs daily. (Patient not taking: Reported on 03/31/2024)     Current Facility-Administered Medications  Medication Dose Route Frequency Provider Last Rate Last Admin   0.9 %  sodium chloride  infusion  500 mL Intravenous Once Nguyen Butler, Elspeth SQUIBB, MD        Allergies as of 04/13/2024 - Review Complete 04/13/2024  Allergen Reaction Noted   Clarithromycin  Shortness Of Breath 10/15/2020   Lisinopril  Swelling 08/13/2017    Family History  Problem Relation Age of Onset   Heart failure Mother    Kidney failure Mother    Heart disease Father    Heart disease Daughter        fluid around heart    Canavan disease Daughter    Cancer Daughter        unsure of type   Asthma Son    Colon cancer Neg Hx    Esophageal cancer Neg Hx    Rectal cancer Neg Hx    Stomach cancer Neg Hx    Colon polyps Neg Hx     Social History   Socioeconomic History   Marital status: Single    Spouse name: Not on file   Number of children: 8   Years of education: Not on file   Highest education level: Not on file  Occupational History   Occupation: it don't work for me, on disability  Tobacco Use   Smoking status: Former    Current packs/day: 0.00    Average packs/day: 0.1 packs/day for 45.0  years (4.5 ttl pk-yrs)    Types: Cigarettes    Start date: 75    Quit date: 2012    Years since quitting: 13.7   Smokeless tobacco: Never  Vaping Use   Vaping status: Never Used  Substance and Sexual Activity   Alcohol  use: No    Comment: Pt states no etoh since April 2014   Drug use: Not Currently    Comment: Prior use of crack cocaine, quit 2012   Sexual activity: Never  Other Topics Concern   Not on file  Social History Narrative   Not on file   Social Drivers of Health   Financial Resource Strain: Not on File (04/21/2022)   Received from [employer]    Financial Resource Strain: 0  Food Insecurity: Not on File (04/21/2022)   Received from [facility] Insecurity    Food: 0  Transportation Needs: Not on File (04/21/2022)   Received from [employer] Needs    Transportation: 0  Physical Activity: Not on File (11/14/2021)   Received from [facility]   Physical Activity    Physical Activity: 0  Stress: Not on File (11/14/2021)   Received from [facility]   Stress    Stress: 0  Social Connections: Not on File (04/06/2023)   Received from [employer]   Social Connections    Connectedness: 0  Intimate Partner Violence: Not on file    Review of Systems: All other review of systems negative except as mentioned in the HPI.  Physical Exam: Vital signs BP 136/69   Pulse 70   Temp (!) 97.2 F (36.2 C) (Temporal)   Ht 5' 11 (1.803 m)   Wt 168 lb (76.2 kg)   SpO2 97%   BMI 23.43 kg/m   General:   Alert,  Well-developed, pleasant and cooperative in NAD Lungs:  Clear throughout to auscultation.   Heart:  Regular rate and rhythm  Abdomen:  Soft, nontender and nondistended.   Neuro/Psych:  Alert and cooperative. Normal mood and affect. A and O x 3  Marcey Naval, MD [facility]astroenterology

## 2024-04-13 NOTE — Progress Notes (Signed)
 Pt's states no medical or surgical changes since previsit or office visit.

## 2024-04-14 ENCOUNTER — Telehealth: Payer: Self-pay

## 2024-04-14 NOTE — Telephone Encounter (Signed)
  Follow up Call-     04/13/2024    1:25 PM  Call back number  Post procedure Call Back phone  # [phone]  Permission to leave phone message No  comments no voicemail     Patient questions:  Do you have a fever, pain , or abdominal swelling? No. Pain Score  0 *  Have you tolerated food without any problems? Yes.    Have you been able to return to your normal activities? Yes.    Do you have any questions about your discharge instructions: Diet   No. Medications  No. Follow up visit  No.  Do you have questions or concerns about your Care? No.  Actions: * If pain score is 4 or above: No action needed, pain <4.

## 2024-04-18 ENCOUNTER — Ambulatory Visit: Payer: Self-pay | Admitting: Gastroenterology

## 2024-04-18 LAB — SURGICAL PATHOLOGY

## 2024-05-03 ENCOUNTER — Ambulatory Visit: Admitting: Physician Assistant

## 2024-05-03 ENCOUNTER — Encounter: Payer: Self-pay | Admitting: Physician Assistant

## 2024-05-03 ENCOUNTER — Other Ambulatory Visit (INDEPENDENT_AMBULATORY_CARE_PROVIDER_SITE_OTHER)

## 2024-05-03 DIAGNOSIS — Z96641 Presence of right artificial hip joint: Secondary | ICD-10-CM

## 2024-05-03 NOTE — Progress Notes (Signed)
 Post-Op Visit Note   Patient: Joe Garcia           Date of Birth: [birth year]           MRN: [id number] Visit Date: 05/03/2024 PCP: Cloretta Arley NOVAK, MD   Assessment & Plan:  Chief Complaint:  Chief Complaint  Patient presents with   Right Hip - Follow-up    Right total hip arthroplasty 05/01/2023   Visit Diagnoses:  1. Status post total replacement of right hip     Plan: Patient is a pleasant 73 year old gentleman who comes in today 1 year status post right total hip replacement 05/01/2023.  He has been doing well.  No complaints about his right hip.  Examination of his right hip reveals painless hip flexion and logroll.  He is neurovascular intact distally.  At this point, he will continue to advance with activity as tolerated.  Follow-up in 1 year for repeat evaluation and AP pelvis x-rays.  Call with concerns or questions.  Follow-Up Instructions: Return in about 1 year (around 05/03/2025).   Orders:  Orders Placed This Encounter  Procedures   XR Pelvis 1-2 Views   No orders of the defined types were placed in this encounter.   Imaging: XR Pelvis 1-2 Views Result Date: 05/03/2024 Well-seated prosthesis without complication   PMFS History: Patient Active Problem List   Diagnosis Date Noted   Postoperative urinary retention 05/02/2023   Status post total replacement of right hip 05/01/2023   Primary osteoarthritis of right hip 04/30/2023   Hyperlipidemia 08/05/2022   Genetic testing 09/07/2020   Acute-on-chronic kidney injury 02/12/2020   Sepsis (HCC) 02/12/2020   Pneumonia 02/12/2020   Multifocal pneumonia 02/11/2020   Port-A-Cath in place 08/04/2019   Chronic systolic heart failure (HCC)    Colonic obstruction (HCC)    Cancer of transverse colon s/p partial colectomy 06/30/2019    Nausea & vomiting 06/26/2019   Colonic mass 06/26/2019   COPD GOLD ?  11/24/2018   Chest pain    NSTEMI (non-ST elevated myocardial infarction) (HCC) 10/03/2018   COPD with  acute exacerbation (HCC) 07/26/2018   ARF (acute renal failure) 07/26/2018   Nausea and vomiting 07/26/2018   Weight loss, unintentional 07/26/2018   Malnutrition of moderate degree 07/16/2017   CKD (chronic kidney disease), stage II 07/14/2017   Solitary kidney, congenital 06/23/2017   Nonischemic cardiomyopathy (HCC)    Multifocal PVCs    Chronic combined systolic (congestive) and diastolic (congestive) heart failure (HCC) 06/11/2017   Hypokalemia 05/05/2017   DOE (dyspnea on exertion) 07/16/2013   Alcohol  abuse 09/25/2012   Alcohol  dependence (HCC) 09/22/2012   Essential hypertension 03/22/2007   DEGENERATIVE DISC DISEASE 03/22/2007   AVASCULAR NECROSIS 03/22/2007   Past Medical History:  Diagnosis Date   Alcoholism (HCC)    hx- hasn't drank since 2014   Allergy    Arthritis    hips, L shoulder   Asthma    Cancer (HCC) 05/2019   colon cancer   Cataract    CHF (congestive heart failure) (HCC)    Chronic kidney disease    only has one kidney   COPD (chronic obstructive pulmonary disease) (HCC)    COVID-19    Depression    pt denies   Dyspnea    Dysrhythmia    PVC's   GERD (gastroesophageal reflux disease)    Heart murmur    Hyperlipidemia    Hypertension    Myocardial infarction [facility])    pt is unsure of  date- believes early 2020   Osteoporosis    Pneumonia    Substance abuse (HCC)    alcoholism over 10 yrs ago    Family History  Problem Relation Age of Onset   Heart failure Mother    Kidney failure Mother    Heart disease Father    Heart disease Daughter        fluid around heart    Canavan disease Daughter    Cancer Daughter        unsure of type   Asthma Son    Colon cancer Neg Hx    Esophageal cancer Neg Hx    Rectal cancer Neg Hx    Stomach cancer Neg Hx    Colon polyps Neg Hx     Past Surgical History:  Procedure Laterality Date   BIOPSY  06/27/2019   Procedure: BIOPSY;  Surgeon: Leigh Elspeth SQUIBB, MD;  Location: [facility] ENDOSCOPY;  Service:  Gastroenterology;;   CATARACT EXTRACTION     COLONOSCOPY Left 06/27/2019   Procedure: COLONOSCOPY;  Surgeon: Leigh Elspeth SQUIBB, MD;  Location: [facility] ENDOSCOPY;  Service: Gastroenterology;  Laterality: Left;   COLONOSCOPY     LAPAROTOMY N/A 06/30/2019   Procedure: EXPLORATORY LAPAROTOMY;  Surgeon: Rubin Calamity, MD;  Location: [facility] OR;  Service: General;  Laterality: N/A;   LEFT HEART CATH AND CORONARY ANGIOGRAPHY N/A 10/04/2018   Procedure: LEFT HEART CATH AND CORONARY ANGIOGRAPHY;  Surgeon: Dann Candyce RAMAN, MD;  Location: [facility] INVASIVE CV LAB;  Service: Cardiovascular;  Laterality: N/A;   PARTIAL COLECTOMY N/A 06/30/2019   Procedure: PARTIAL COLECTOMY;  Surgeon: Rubin Calamity, MD;  Location: [facility] OR;  Service: General;  Laterality: N/A;   PORTACATH PLACEMENT Left 08/02/2019   Procedure: INSERTION PORT-A-CATH LEFT CHEST;  Surgeon: Rubin Calamity, MD;  Location: [facility] OR;  Service: General;  Laterality: Left;   RIGHT/LEFT HEART CATH AND CORONARY ANGIOGRAPHY N/A 06/12/2017   Procedure: RIGHT/LEFT HEART CATH AND CORONARY ANGIOGRAPHY;  Surgeon: Cherrie Toribio SAUNDERS, MD;  Location: [facility] INVASIVE CV LAB;  Service: Cardiovascular;  Laterality: N/A;   TOTAL HIP ARTHROPLASTY Right 05/01/2023   Procedure: TOTAL HIP ARTHROPLASTY ANTERIOR APPROACH;  Surgeon: Jerri Kay HERO, MD;  Location: [facility] OR;  Service: Orthopedics;  Laterality: Right;   Social History   Occupational History   Occupation: it don't work for me, on disability  Tobacco Use   Smoking status: Former    Current packs/day: 0.00    Average packs/day: 0.1 packs/day for 45.0 years (4.5 ttl pk-yrs)    Types: Cigarettes    Start date: 78    Quit date: 2012    Years since quitting: 13.7   Smokeless tobacco: Never  Vaping Use   Vaping status: Never Used  Substance and Sexual Activity   Alcohol  use: No    Comment: Pt states no etoh since April 2014   Drug use: Not Currently    Comment: Prior use of crack cocaine, quit 2012   Sexual  activity: Never

## 2024-05-06 ENCOUNTER — Telehealth: Payer: Self-pay | Admitting: Oncology

## 2024-05-06 NOTE — Telephone Encounter (Signed)
 Patient has been scheduled for follow-up visit per 12/24/23 LOS.  Pt aware of scheduled appt details.

## 2024-06-22 ENCOUNTER — Ambulatory Visit (HOSPITAL_BASED_OUTPATIENT_CLINIC_OR_DEPARTMENT_OTHER)
Admission: RE | Admit: 2024-06-22 | Discharge: 2024-06-22 | Disposition: A | Discharge status: Home / Self Care | Source: Ambulatory Visit | Attending: Oncology | Admitting: Oncology

## 2024-06-22 ENCOUNTER — Inpatient Hospital Stay: Attending: Oncology

## 2024-06-22 DIAGNOSIS — Z79899 Other long term (current) drug therapy: Secondary | ICD-10-CM | POA: Insufficient documentation

## 2024-06-22 DIAGNOSIS — C184 Malignant neoplasm of transverse colon: Secondary | ICD-10-CM | POA: Diagnosis present

## 2024-06-22 LAB — CEA (ACCESS): CEA (CHCC): 1.24 ng/mL (ref 0.00–5.00)

## 2024-06-29 ENCOUNTER — Inpatient Hospital Stay: Admitting: Oncology

## 2024-06-29 ENCOUNTER — Telehealth: Payer: Self-pay | Admitting: Oncology

## 2024-06-29 NOTE — Telephone Encounter (Signed)
 Called PT to reschedule appt due to him being sick, let him know that I would reach out to Nurses to see if we can get him in for a earlier appt time and day.

## 2024-07-01 ENCOUNTER — Ambulatory Visit (HOSPITAL_COMMUNITY)
Admission: EM | Admit: 2024-07-01 | Discharge: 2024-07-01 | Disposition: A | Discharge status: Home / Self Care | Source: Ambulatory Visit | Attending: Nurse Practitioner | Admitting: Nurse Practitioner

## 2024-07-01 ENCOUNTER — Encounter (HOSPITAL_COMMUNITY): Payer: Self-pay

## 2024-07-01 DIAGNOSIS — J4541 Moderate persistent asthma with (acute) exacerbation: Secondary | ICD-10-CM

## 2024-07-01 MED ORDER — IPRATROPIUM-ALBUTEROL 0.5-2.5 (3) MG/3ML IN SOLN: 3.0000 mL | RESPIRATORY_TRACT | 0 refills | Status: AC | PRN

## 2024-07-01 MED ORDER — IPRATROPIUM-ALBUTEROL 0.5-2.5 (3) MG/3ML IN SOLN
RESPIRATORY_TRACT | Status: AC
  Filled 2024-07-01: qty 3

## 2024-07-01 MED ORDER — METHYLPREDNISOLONE SODIUM SUCC 125 MG IJ SOLR
125.0000 mg | Freq: Once | INTRAMUSCULAR | Status: AC
  Administered 2024-07-01: 125 mg via INTRAMUSCULAR

## 2024-07-01 MED ORDER — PREDNISONE 20 MG PO TABS: 40.0000 mg | ORAL_TABLET | Freq: Every day | ORAL | 0 refills | Status: AC

## 2024-07-01 MED ORDER — METHYLPREDNISOLONE SODIUM SUCC 125 MG IJ SOLR
INTRAMUSCULAR | Status: AC
  Filled 2024-07-01: qty 2

## 2024-07-01 MED ORDER — BENZONATATE 100 MG PO CAPS: 100.0000 mg | ORAL_CAPSULE | Freq: Three times a day (TID) | ORAL | 0 refills | Status: DC | PRN

## 2024-07-01 MED ORDER — IPRATROPIUM-ALBUTEROL 0.5-2.5 (3) MG/3ML IN SOLN
3.0000 mL | Freq: Once | RESPIRATORY_TRACT | Status: AC
  Administered 2024-07-01: 3 mL via RESPIRATORY_TRACT

## 2024-07-01 NOTE — Discharge Instructions (Signed)
 You are having an asthma exacerbation likely due to a viral illness.  We gave you a breathing treatment and injection of steroid medicine today.  Continue scheduled breathing treatments OR albuterol  inhaler at home every 6 hours for the next 2 days then use your rescue inhaler OR nebulizer as needed. In addition, start the oral prednisone  tomorrow morning, start the cough medicine as needed, and continue Mucinex .    Seek care in the ER if your symptoms worsen in any way.

## 2024-07-01 NOTE — ED Triage Notes (Signed)
 Patient c/o a productive cough with white/yellow sputum, SOB, headache, and nasal congestion x 4 days.  Patient has been using his Albuterol  inhaler excessively and states he ran out of his Albuterol  neb medicine last night. Patient has also been taking Mucinex .

## 2024-07-01 NOTE — ED Provider Notes (Signed)
 [facility]    CSN: [id number] Arrival date & time: 07/01/24  1342      History   Chief Complaint Chief Complaint  Patient presents with   Nasal Congestion   Cough   Shortness of Breath   Headache    HPI Joe Garcia is a 73 y.o. male.   Patient presents today with 4-day history of bodyaches, congested cough, shortness of breath, runny and stuffy nose, sore throat, headache, abdominal pain from coughing so much, decreased appetite, and fatigue.  Patient denies fever, wheezing, chest pain or tightness, nausea, vomiting, and diarrhea.  No known sick contacts.  Has been taking Tylenol  and Mucinex  for symptoms with minimal improvement.  Also reports history of asthma and has been using albuterol  inhaler or albuterol  nebulizer and has been out of nebulizer medicine last night.  Reports the albuterol  temporarily helps with his breathing.  Patient reports his primary provider has prescribed him Symbicort  in the past but it made his throat itchy and so he stopped taking it.  He reports that the albuterol  inhaler is his daily inhaler.    Past Medical History:  Diagnosis Date   Alcoholism (HCC)    hx- hasn't drank since 2014   Allergy    Arthritis    hips, L shoulder   Asthma    Cancer (HCC) 05/2019   colon cancer   Cataract    CHF (congestive heart failure) (HCC)    Chronic kidney disease    only has one kidney   COPD (chronic obstructive pulmonary disease) (HCC)    COVID-19    Depression    pt denies   Dyspnea    Dysrhythmia    PVC's   GERD (gastroesophageal reflux disease)    Heart murmur    Hyperlipidemia    Hypertension    Myocardial infarction [facility])    pt is unsure of date- believes early 2020   Osteoporosis    Pneumonia    Substance abuse (HCC)    alcoholism over 10 yrs ago    Patient Active Problem List   Diagnosis Date Noted   Postoperative urinary retention 05/02/2023   Status post total replacement of right hip 05/01/2023   Primary  osteoarthritis of right hip 04/30/2023   Hyperlipidemia 08/05/2022   Genetic testing 09/07/2020   Acute-on-chronic kidney injury 02/12/2020   Sepsis (HCC) 02/12/2020   Pneumonia 02/12/2020   Multifocal pneumonia 02/11/2020   Port-A-Cath in place 08/04/2019   Chronic systolic heart failure (HCC)    Colonic obstruction (HCC)    Cancer of transverse colon s/p partial colectomy 06/30/2019    Nausea & vomiting 06/26/2019   Colonic mass 06/26/2019   COPD GOLD ?  11/24/2018   Chest pain    NSTEMI (non-ST elevated myocardial infarction) (HCC) 10/03/2018   COPD with acute exacerbation (HCC) 07/26/2018   ARF (acute renal failure) 07/26/2018   Nausea and vomiting 07/26/2018   Weight loss, unintentional 07/26/2018   Malnutrition of moderate degree 07/16/2017   CKD (chronic kidney disease), stage II 07/14/2017   Solitary kidney, congenital 06/23/2017   Nonischemic cardiomyopathy (HCC)    Multifocal PVCs    Chronic combined systolic (congestive) and diastolic (congestive) heart failure (HCC) 06/11/2017   Hypokalemia 05/05/2017   DOE (dyspnea on exertion) 07/16/2013   Alcohol  abuse 09/25/2012   Alcohol  dependence (HCC) 09/22/2012   Essential hypertension 03/22/2007   DEGENERATIVE DISC DISEASE 03/22/2007   AVASCULAR NECROSIS 03/22/2007    Past Surgical History:  Procedure Laterality Date  BIOPSY  06/27/2019   Procedure: BIOPSY;  Surgeon: Leigh Elspeth SQUIBB, MD;  Location: [facility] ENDOSCOPY;  Service: Gastroenterology;;   CATARACT EXTRACTION     COLONOSCOPY Left 06/27/2019   Procedure: COLONOSCOPY;  Surgeon: Leigh Elspeth SQUIBB, MD;  Location: [facility] ENDOSCOPY;  Service: Gastroenterology;  Laterality: Left;   COLONOSCOPY     LAPAROTOMY N/A 06/30/2019   Procedure: EXPLORATORY LAPAROTOMY;  Surgeon: Rubin Calamity, MD;  Location: [facility] OR;  Service: General;  Laterality: N/A;   LEFT HEART CATH AND CORONARY ANGIOGRAPHY N/A 10/04/2018   Procedure: LEFT HEART CATH AND CORONARY ANGIOGRAPHY;  Surgeon:  Dann Candyce RAMAN, MD;  Location: [facility] INVASIVE CV LAB;  Service: Cardiovascular;  Laterality: N/A;   PARTIAL COLECTOMY N/A 06/30/2019   Procedure: PARTIAL COLECTOMY;  Surgeon: Rubin Calamity, MD;  Location: [facility] OR;  Service: General;  Laterality: N/A;   PORTACATH PLACEMENT Left 08/02/2019   Procedure: INSERTION PORT-A-CATH LEFT CHEST;  Surgeon: Rubin Calamity, MD;  Location: [facility] OR;  Service: General;  Laterality: Left;   RIGHT/LEFT HEART CATH AND CORONARY ANGIOGRAPHY N/A 06/12/2017   Procedure: RIGHT/LEFT HEART CATH AND CORONARY ANGIOGRAPHY;  Surgeon: Cherrie Toribio SAUNDERS, MD;  Location: [facility] INVASIVE CV LAB;  Service: Cardiovascular;  Laterality: N/A;   TOTAL HIP ARTHROPLASTY Right 05/01/2023   Procedure: TOTAL HIP ARTHROPLASTY ANTERIOR APPROACH;  Surgeon: Jerri Kay HERO, MD;  Location: [facility] OR;  Service: Orthopedics;  Laterality: Right;       Home Medications    Prior to Admission medications   Medication Sig Start Date End Date Taking? Authorizing Provider  benzonatate  (TESSALON ) 100 MG capsule Take 1 capsule (100 mg total) by mouth 3 (three) times daily as needed for cough. Do not take with alcohol  or while operating or driving heavy machinery 87/4/74  Yes Chandra Raisin A, NP  predniSONE  (DELTASONE ) 20 MG tablet Take 2 tablets (40 mg total) by mouth daily with breakfast for 5 days. 07/01/24 07/06/24 Yes Chandra Raisin LABOR, NP  albuterol  (VENTOLIN  HFA) 108 (90 Base) MCG/ACT inhaler Inhale 2 puffs into the lungs every 4 (four) hours as needed for wheezing or shortness of breath. 07/09/20   Rodriguez-Southworth, Sylvia, PA-C  amiodarone  (PACERONE ) 200 MG tablet TAKE 1/2 TABLET (100 MG TOTAL) BY MOUTH EVERY MORNING. 02/04/24   Court Dorn PARAS, MD  amLODipine  (NORVASC ) 5 MG tablet TAKE 1 TABLET (5 MG TOTAL) BY MOUTH EVERY MORNING. 03/03/24   Court Dorn PARAS, MD  atorvastatin (LIPITOR) 10 MG tablet Take 10 mg by mouth at bedtime.    [provider]  budesonide -formoterol  (SYMBICORT )  160-4.5 MCG/ACT inhaler Inhale 2 puffs into the lungs 2 (two) times daily. 11/23/18   Darlean Ozell NOVAK, MD  carvedilol  (COREG ) 3.125 MG tablet Take 1 tablet (3.125 mg total) by mouth 2 (two) times daily with a meal. 12/09/23   Court Dorn PARAS, MD  fluticasone  (FLONASE ) 50 MCG/ACT nasal spray Place 2 sprays into both nostrils daily as needed for allergies or rhinitis. 02/13/20   Cheryle Page, MD  furosemide  (LASIX ) 20 MG tablet TAKE 1 TABLET (20 MG TOTAL) BY MOUTH EVERY MORNING. 02/04/24   Court Dorn PARAS, MD  ipratropium-albuterol  (DUONEB) 0.5-2.5 (3) MG/3ML SOLN Take 3 mLs by nebulization every 4 (four) hours as needed. 07/01/24   Chandra Raisin LABOR, NP  isosorbide -hydrALAZINE  (BIDIL ) 20-37.5 MG tablet TAKE 1 TABLET BY MOUTH IN THE MORNING, AT NOON, AND AT BEDTIME. 02/04/24   Court Dorn PARAS, MD  levocetirizine (XYZAL) 5 MG tablet Take 5 mg by mouth daily. 04/04/24  [provider]  losartan  (COZAAR ) 50 MG tablet Take 1 tablet (50 mg total) by mouth every morning. 12/09/23   Court Dorn PARAS, MD  montelukast  (SINGULAIR ) 10 MG tablet Take 10 mg by mouth at bedtime. Patient not taking: Reported on 07/01/2024    [provider]  tamsulosin  (FLOMAX ) 0.4 MG CAPS capsule Take 0.4 mg by mouth daily after supper. 05/26/23   [provider]  TRELEGY ELLIPTA 100-62.5-25 MCG/ACT AEPB Inhale 1 puff into the lungs daily. Patient not taking: Reported on 03/31/2024 09/11/22   [provider]    Family History Family History  Problem Relation Age of Onset   Heart failure Mother    Kidney failure Mother    Heart disease Father    Heart disease Daughter        fluid around heart    Canavan disease Daughter    Cancer Daughter        unsure of type   Asthma Son    Colon cancer Neg Hx    Esophageal cancer Neg Hx    Rectal cancer Neg Hx    Stomach cancer Neg Hx    Colon polyps Neg Hx     Social History Social History   Tobacco Use   Smoking status: Former     Current packs/day: 0.00    Average packs/day: 0.1 packs/day for 45.0 years (4.5 ttl pk-yrs)    Types: Cigarettes    Start date: 53    Quit date: 2012    Years since quitting: 13.9   Smokeless tobacco: Never  Vaping Use   Vaping status: Never Used  Substance Use Topics   Alcohol  use: No    Comment: Pt states no etoh since April 2014   Drug use: Not Currently    Comment: Prior use of crack cocaine, quit 2012     Allergies   Clarithromycin  and Lisinopril    Review of Systems Review of Systems Per HPI  Physical Exam Triage Vital Signs ED Triage Vitals  Encounter Vitals Group     BP 07/01/24 1357 133/72     Girls Systolic BP Percentile --      Girls Diastolic BP Percentile --      Boys Systolic BP Percentile --      Boys Diastolic BP Percentile --      Pulse Rate 07/01/24 1357 76     Resp 07/01/24 1357 16     Temp 07/01/24 1357 98 F (36.7 C)     Temp Source 07/01/24 1357 Oral     SpO2 07/01/24 1357 95 %     Weight --      Height --      Head Circumference --      Peak Flow --      Pain Score 07/01/24 1359 0     Pain Loc --      Pain Education --      Exclude from Growth Chart --    No data found.  Updated Vital Signs BP 133/72 (BP Location: Left Arm)   Pulse 76   Temp 98 F (36.7 C) (Oral)   Resp 16   SpO2 95%   Visual Acuity Right Eye Distance:   Left Eye Distance:   Bilateral Distance:    Right Eye Near:   Left Eye Near:    Bilateral Near:     Physical Exam Vitals and nursing note reviewed.  Constitutional:      General: He is not in acute distress.  Appearance: Normal appearance. He is not ill-appearing or toxic-appearing.  HENT:     Head: Normocephalic and atraumatic.     Right Ear: Tympanic membrane, ear canal and external ear normal.     Left Ear: Tympanic membrane, ear canal and external ear normal.     Nose: Congestion present. No rhinorrhea.     Mouth/Throat:     Mouth: Mucous membranes are moist.     Pharynx: Oropharynx is  clear. Posterior oropharyngeal erythema present. No oropharyngeal exudate.  Eyes:     General: No scleral icterus.    Extraocular Movements: Extraocular movements intact.  Cardiovascular:     Rate and Rhythm: Normal rate and regular rhythm.  Pulmonary:     Effort: Pulmonary effort is normal. No respiratory distress.     Breath sounds: Wheezing present. No rhonchi or rales.  Musculoskeletal:     Cervical back: Normal range of motion and neck supple.  Lymphadenopathy:     Cervical: No cervical adenopathy.  Skin:    General: Skin is warm and dry.     Coloration: Skin is not jaundiced or pale.     Findings: No erythema or rash.  Neurological:     Mental Status: He is alert and oriented to person, place, and time.  Psychiatric:        Behavior: Behavior is cooperative.      UC Treatments / Results  Labs (all labs ordered are listed, but only abnormal results are displayed) Labs Reviewed - No data to display  EKG   Radiology No results found.  Procedures Procedures (including critical care time)  Medications Ordered in UC Medications  ipratropium-albuterol  (DUONEB) 0.5-2.5 (3) MG/3ML nebulizer solution 3 mL (3 mLs Nebulization Given 07/01/24 1431)  methylPREDNISolone  sodium succinate (SOLU-MEDROL ) 125 mg/2 mL injection 125 mg (125 mg Intramuscular Given 07/01/24 1429)    Initial Impression / Assessment and Plan / UC Course  I have reviewed the triage vital signs and the nursing notes.  Pertinent labs & imaging results that were available during my care of the patient were reviewed by me and considered in my medical decision making (see chart for details).   Patient is a very pleasant, elderly appearing 73 year old male presenting today for shortness of breath and cough.  His vital signs are stable and he is nontoxic-appearing.  We deferred viral testing given length of symptoms.  A DuoNeb and Solu-Medrol  were given in urgent care today for asthma exacerbation, likely due to  viral illness.  Patient responded to breathing treatment well and air movement proved bilaterally, however wheezing remained.  Patient reported subjective improvement in breathing.  Refill given for DuoNebs at home, start oral prednisone  tomorrow morning, other supportive care discussed including antitussives, expectorant.  ER and return precautions discussed with the patient.  The patient was given the opportunity to ask questions.  All questions answered to their satisfaction.  The patient is in agreement to this plan.   Final Clinical Impressions(s) / UC Diagnoses   Final diagnoses:  Moderate persistent asthma with acute exacerbation     Discharge Instructions      You are having an asthma exacerbation likely due to a viral illness.  We gave you a breathing treatment and injection of steroid medicine today.  Continue scheduled breathing treatments OR albuterol  inhaler at home every 6 hours for the next 2 days then use your rescue inhaler OR nebulizer as needed. In addition, start the oral prednisone  tomorrow morning, start the cough medicine as needed, and continue  Mucinex .    Seek care in the ER if your symptoms worsen in any way.     ED Prescriptions     Medication Sig Dispense Auth. Provider   ipratropium-albuterol  (DUONEB) 0.5-2.5 (3) MG/3ML SOLN Take 3 mLs by nebulization every 4 (four) hours as needed. 120 mL Chandra Raisin A, NP   predniSONE  (DELTASONE ) 20 MG tablet Take 2 tablets (40 mg total) by mouth daily with breakfast for 5 days. 10 tablet Chandra Raisin A, NP   benzonatate  (TESSALON ) 100 MG capsule Take 1 capsule (100 mg total) by mouth 3 (three) times daily as needed for cough. Do not take with alcohol  or while operating or driving heavy machinery 21 capsule Chandra Raisin LABOR, NP      PDMP not reviewed this encounter.   Chandra Raisin LABOR, NP 07/01/24 [phone]

## 2024-07-07 ENCOUNTER — Emergency Department (HOSPITAL_COMMUNITY)

## 2024-07-07 ENCOUNTER — Encounter (HOSPITAL_COMMUNITY): Payer: Self-pay

## 2024-07-07 ENCOUNTER — Emergency Department (HOSPITAL_COMMUNITY)
Admission: EM | Admit: 2024-07-07 | Discharge: 2024-07-07 | Disposition: A | Discharge status: Home / Self Care | Attending: Emergency Medicine | Admitting: Emergency Medicine

## 2024-07-07 DIAGNOSIS — D649 Anemia, unspecified: Secondary | ICD-10-CM | POA: Diagnosis not present

## 2024-07-07 DIAGNOSIS — R059 Cough, unspecified: Secondary | ICD-10-CM | POA: Diagnosis present

## 2024-07-07 DIAGNOSIS — J45901 Unspecified asthma with (acute) exacerbation: Secondary | ICD-10-CM | POA: Insufficient documentation

## 2024-07-07 DIAGNOSIS — B9689 Other specified bacterial agents as the cause of diseases classified elsewhere: Secondary | ICD-10-CM | POA: Insufficient documentation

## 2024-07-07 DIAGNOSIS — J329 Chronic sinusitis, unspecified: Secondary | ICD-10-CM | POA: Insufficient documentation

## 2024-07-07 LAB — RESP PANEL BY RT-PCR (RSV, FLU A&B, COVID)  RVPGX2
Influenza A by PCR: NEGATIVE
Influenza B by PCR: NEGATIVE
Resp Syncytial Virus by PCR: NEGATIVE
SARS Coronavirus 2 by RT PCR: NEGATIVE

## 2024-07-07 LAB — CBC
HCT: 39.9 % (ref 39.0–52.0)
Hemoglobin: 12.9 g/dL — ABNORMAL LOW (ref 13.0–17.0)
MCH: 28 pg (ref 26.0–34.0)
MCHC: 32.3 g/dL (ref 30.0–36.0)
MCV: 86.7 fL (ref 80.0–100.0)
Platelets: 267 K/uL (ref 150–400)
RBC: 4.6 MIL/uL (ref 4.22–5.81)
RDW: 15.5 % (ref 11.5–15.5)
WBC: 11.2 K/uL — ABNORMAL HIGH (ref 4.0–10.5)
nRBC: 0 % (ref 0.0–0.2)

## 2024-07-07 LAB — BASIC METABOLIC PANEL WITH GFR
Anion gap: 10 (ref 5–15)
BUN: 21 mg/dL (ref 8–23)
CO2: 24 mmol/L (ref 22–32)
Calcium: 8.6 mg/dL — ABNORMAL LOW (ref 8.9–10.3)
Chloride: 108 mmol/L (ref 98–111)
Creatinine, Ser: 1.42 mg/dL — ABNORMAL HIGH (ref 0.61–1.24)
GFR, Estimated: 53 mL/min — ABNORMAL LOW (ref >60)
Glucose, Bld: 88 mg/dL (ref 70–99)
Potassium: 3.6 mmol/L (ref 3.5–5.1)
Sodium: 142 mmol/L (ref 135–145)

## 2024-07-07 MED ORDER — AMOXICILLIN-POT CLAVULANATE 875-125 MG PO TABS: 1.0000 | ORAL_TABLET | Freq: Two times a day (BID) | ORAL | 0 refills | Status: DC

## 2024-07-07 MED ORDER — IPRATROPIUM-ALBUTEROL 0.5-2.5 (3) MG/3ML IN SOLN
3.0000 mL | Freq: Once | RESPIRATORY_TRACT | Status: AC
  Administered 2024-07-07: 3 mL via RESPIRATORY_TRACT
  Filled 2024-07-07: qty 3

## 2024-07-07 MED ORDER — METHYLPREDNISOLONE SODIUM SUCC 125 MG IJ SOLR
125.0000 mg | Freq: Once | INTRAMUSCULAR | Status: AC
  Administered 2024-07-07: 125 mg via INTRAMUSCULAR
  Filled 2024-07-07: qty 2

## 2024-07-07 MED ORDER — ALBUTEROL SULFATE (2.5 MG/3ML) 0.083% IN NEBU
10.0000 mg | INHALATION_SOLUTION | Freq: Once | RESPIRATORY_TRACT | Status: AC
  Administered 2024-07-07: 10 mg via RESPIRATORY_TRACT
  Filled 2024-07-07: qty 12

## 2024-07-07 MED ORDER — PREDNISONE 10 MG (21) PO TBPK: ORAL_TABLET | Freq: Every day | ORAL | 0 refills | Status: DC

## 2024-07-07 MED ORDER — AMOXICILLIN-POT CLAVULANATE 875-125 MG PO TABS
1.0000 | ORAL_TABLET | Freq: Once | ORAL | Status: AC
  Administered 2024-07-07: 1 via ORAL
  Filled 2024-07-07: qty 1

## 2024-07-07 NOTE — ED Notes (Signed)
 Pt ambulated without respiratory complaints. SPO2 remained above 96%

## 2024-07-07 NOTE — ED Triage Notes (Signed)
 See Quick Triage note.  Pt reports he is taking a lot of breathing treatments, but stuff still isn't coming up.  Pt noted to speak full sentences.

## 2024-07-07 NOTE — ED Provider Notes (Signed)
 [place] EMERGENCY DEPARTMENT AT [facility] Provider Note   CSN: [id number] Arrival date & time: 07/07/24  1325     Patient presents with: Cough and URI   Joe Garcia is a 73 y.o. male.  History of asthma presents today for cough, congestion, headache, rhinorrhea, chills, and diarrhea times 2 weeks.  Patient was seen at urgent care a little over a week ago and diagnosed with a viral upper respiratory infection with asthma exacerbation and given a short course of prednisone .  Patient reports he did have some improvement with prednisone , however his symptoms have since returned.    Cough Associated symptoms: chills, headaches and rhinorrhea   URI Presenting symptoms: congestion, cough and rhinorrhea   Associated symptoms: headaches        Prior to Admission medications  Medication Sig Start Date End Date Taking? Authorizing Provider  amoxicillin -clavulanate (AUGMENTIN ) 875-125 MG tablet Take 1 tablet by mouth every 12 (twelve) hours. 07/07/24  Yes Francis Ileana SAILOR, PA-C  predniSONE  (STERAPRED UNI-PAK 21 TAB) 10 MG (21) TBPK tablet Take by mouth daily. Take 6 tabs by mouth daily  for 2 days, then 5 tabs for 2 days, then 4 tabs for 2 days, then 3 tabs for 2 days, 2 tabs for 2 days, then 1 tab by mouth daily for 2 days 07/07/24  Yes Francis Ileana SAILOR, PA-C  albuterol  (VENTOLIN  HFA) 108 (90 Base) MCG/ACT inhaler Inhale 2 puffs into the lungs every 4 (four) hours as needed for wheezing or shortness of breath. 07/09/20   Rodriguez-Southworth, Sylvia, PA-C  amiodarone  (PACERONE ) 200 MG tablet TAKE 1/2 TABLET (100 MG TOTAL) BY MOUTH EVERY MORNING. 02/04/24   Court Dorn PARAS, MD  amLODipine  (NORVASC ) 5 MG tablet TAKE 1 TABLET (5 MG TOTAL) BY MOUTH EVERY MORNING. 03/03/24   Court Dorn PARAS, MD  atorvastatin (LIPITOR) 10 MG tablet Take 10 mg by mouth at bedtime.    [provider]  benzonatate  (TESSALON ) 100 MG capsule Take 1 capsule (100 mg total) by mouth 3 (three) times  daily as needed for cough. Do not take with alcohol  or while operating or driving heavy machinery 87/4/74   Chandra Harlene LABOR, NP  budesonide -formoterol  (SYMBICORT ) 160-4.5 MCG/ACT inhaler Inhale 2 puffs into the lungs 2 (two) times daily. 11/23/18   Darlean Ozell NOVAK, MD  carvedilol  (COREG ) 3.125 MG tablet Take 1 tablet (3.125 mg total) by mouth 2 (two) times daily with a meal. 12/09/23   Court Dorn PARAS, MD  fluticasone  (FLONASE ) 50 MCG/ACT nasal spray Place 2 sprays into both nostrils daily as needed for allergies or rhinitis. 02/13/20   Cheryle Page, MD  furosemide  (LASIX ) 20 MG tablet TAKE 1 TABLET (20 MG TOTAL) BY MOUTH EVERY MORNING. 02/04/24   Court Dorn PARAS, MD  ipratropium-albuterol  (DUONEB) 0.5-2.5 (3) MG/3ML SOLN Take 3 mLs by nebulization every 4 (four) hours as needed. 07/01/24   Chandra Harlene LABOR, NP  isosorbide -hydrALAZINE  (BIDIL ) 20-37.5 MG tablet TAKE 1 TABLET BY MOUTH IN THE MORNING, AT NOON, AND AT BEDTIME. 02/04/24   Court Dorn PARAS, MD  levocetirizine (XYZAL) 5 MG tablet Take 5 mg by mouth daily. 04/04/24   [provider]  losartan  (COZAAR ) 50 MG tablet Take 1 tablet (50 mg total) by mouth every morning. 12/09/23   Court Dorn PARAS, MD  montelukast  (SINGULAIR ) 10 MG tablet Take 10 mg by mouth at bedtime. Patient not taking: Reported on 07/01/2024    [provider]  tamsulosin  (FLOMAX ) 0.4 MG CAPS capsule  Take 0.4 mg by mouth daily after supper. 05/26/23   [provider]  TRELEGY ELLIPTA 100-62.5-25 MCG/ACT AEPB Inhale 1 puff into the lungs daily. Patient not taking: Reported on 03/31/2024 09/11/22   [provider]    Allergies: Clarithromycin  and Lisinopril     Review of Systems  Constitutional:  Positive for chills.  HENT:  Positive for congestion and rhinorrhea.   Respiratory:  Positive for cough.   Neurological:  Positive for headaches.    Updated Vital Signs BP (!) 145/74 (BP Location: Left Arm)   Pulse 63   Temp (!) 97.5 F  (36.4 C)   Resp 16   Ht 5' 11 (1.803 m)   Wt 76.2 kg   SpO2 96%   BMI 23.43 kg/m   Physical Exam Constitutional:      General: He is not in acute distress.    Appearance: Normal appearance. He is not toxic-appearing.  HENT:     Head: Normocephalic and atraumatic.     Right Ear: External ear normal.     Left Ear: External ear normal.     Nose: Congestion present.     Right Sinus: Maxillary sinus tenderness and frontal sinus tenderness present.     Left Sinus: Maxillary sinus tenderness and frontal sinus tenderness present.     Mouth/Throat:     Mouth: Mucous membranes are moist.     Pharynx: Posterior oropharyngeal erythema present.  Eyes:     Pupils: Pupils are equal, round, and reactive to light.  Cardiovascular:     Rate and Rhythm: Normal rate and regular rhythm.     Pulses: Normal pulses.     Heart sounds: Normal heart sounds.  Pulmonary:     Effort: No respiratory distress.     Breath sounds: Wheezing present.  Abdominal:     General: Bowel sounds are normal.     Palpations: Abdomen is soft.  Musculoskeletal:     Cervical back: No rigidity.  Skin:    General: Skin is warm and dry.     Capillary Refill: Capillary refill takes less than 2 seconds.  Neurological:     General: No focal deficit present.     Mental Status: He is alert and oriented to person, place, and time.  Psychiatric:        Mood and Affect: Mood normal.     (all labs ordered are listed, but only abnormal results are displayed) Labs Reviewed  BASIC METABOLIC PANEL WITH GFR - Abnormal; Notable for the following components:      Result Value   Creatinine, Ser 1.42 (*)    Calcium  8.6 (*)    GFR, Estimated 53 (*)    All other components within normal limits  CBC - Abnormal; Notable for the following components:   WBC 11.2 (*)    Hemoglobin 12.9 (*)    All other components within normal limits  RESP PANEL BY RT-PCR (RSV, FLU A&B, COVID)  [id number]    EKG: None  Radiology: DG Chest 2  View Result Date: 07/07/2024 EXAM: 2 VIEW(S) XRAY OF THE CHEST 07/07/2024 03:58:00 PM COMPARISON: 08/18/2022 CLINICAL HISTORY: cough and congestion FINDINGS: LUNGS AND PLEURA: No focal pulmonary opacity. No pleural effusion. No pneumothorax. HEART AND MEDIASTINUM: No acute abnormality of the cardiac and mediastinal silhouettes. BONES AND SOFT TISSUES: No acute osseous abnormality. IMPRESSION: 1. No acute cardiopulmonary process. Electronically signed by: Lynwood Seip MD 07/07/2024 04:15 PM EST RP Workstation: [id number]     Procedures   Medications Ordered in  the ED  ipratropium-albuterol  (DUONEB) 0.5-2.5 (3) MG/3ML nebulizer solution 3 mL (3 mLs Nebulization Given 07/07/24 1951)  methylPREDNISolone  sodium succinate (SOLU-MEDROL ) 125 mg/2 mL injection 125 mg (125 mg Intramuscular Given 07/07/24 1951)  albuterol  (PROVENTIL ) (2.5 MG/3ML) 0.083% nebulizer solution 10 mg (10 mg Nebulization Given 07/07/24 2057)  amoxicillin -clavulanate (AUGMENTIN ) 875-125 MG per tablet 1 tablet (1 tablet Oral Given 07/07/24 2218)                                    Medical Decision Making Amount and/or Complexity of Data Reviewed Labs: ordered. Radiology: ordered.  Risk Prescription drug management.   lalThis patient presents to the ED for concern of URI Sxs differential diagnosis includes COVID, flu, RSV, pneumonia, viral URI, bacterial sinusitis    Additional history obtained   Additional history obtained from Electronic Medical Record External records from outside source obtained and reviewed including oncology notes   Lab Tests:  I Ordered, and personally interpreted labs.  The pertinent results include: Mildly cytosis at 11.2, mild anemia 12.9, BMP with elevated creatinine at 1.42 which is around patient's baseline, respiratory panel negative   Imaging Studies ordered:  I ordered imaging studies including chest x-ray I independently visualized and interpreted imaging which showed no acute  cardiopulmonary process I agree with the radiologist interpretation   Medicines ordered and prescription drug management:  I ordered medication including DuoNeb and Solu-Medrol  IM, continuous neb, Augmentin     I have reviewed the patients home medicines and have made adjustments as needed   Problem List / ED Course:  Patient reassessed after continuous neb and only sporadic wheezing heard in left lower lobe.  Patient states he is feeling much better. Patient able to ambulate on pulse ox and maintain SpO2 of 96% or greater on RA Considered for admission or further workup however patient's vital signs, physical exam, labs, and imaging are reassuring.  Patient's symptoms likely due to acute sinusitis and asthma exacerbation.  Patient treated with Augmentin  and prednisone  taper outpatient.  Patient given return precautions.  I feel patient is safe for discharge at this time.     Final diagnoses:  Bacterial sinusitis  Mild asthma with exacerbation, unspecified whether persistent    ED Discharge Orders          Ordered    amoxicillin -clavulanate (AUGMENTIN ) 875-125 MG tablet  Every 12 hours        07/07/24 2213    predniSONE  (STERAPRED UNI-PAK 21 TAB) 10 MG (21) TBPK tablet  Daily        07/07/24 2213               Francis Ileana SAILOR, PA-C 07/07/24 2240    Patsey Lot, MD 07/07/24 2307

## 2024-07-07 NOTE — ED Triage Notes (Signed)
 Pt to ED c/o cold symptoms and cough with yellow sputum x 2 weeks, evaluated at [facility] for the same 1 week ago and has completed prescribed steroid treatment but reports not feeling better. Hx asthma.

## 2024-07-07 NOTE — Discharge Instructions (Addendum)
 Today you were seen for a sinus infection and mild asthma exacerbation.  Please pick up your antibiotic and prednisone  taper and take as prescribed.  Thank you for letting us  treat you today. After reviewing your labs and imaging, I feel you are safe to go home. Please follow up with your PCP in the next several days and provide them with your records from this visit. Return to the Emergency Room if pain becomes severe or symptoms worsen.

## 2024-08-02 ENCOUNTER — Telehealth: Payer: Self-pay | Admitting: Cardiovascular Disease

## 2024-08-02 NOTE — Telephone Encounter (Signed)
 Pt c/o medication issue:  1. Name of Medication: amiodarone  (PACERONE ) 200 MG tablet   2. How are you currently taking this medication (dosage and times per day)? N/A  3. Are you having a reaction (difficulty breathing--STAT)? N/A  4. What is your medication issue? Vertell from [facility] would like to know what diagnoses this medication is used for.

## 2024-08-02 NOTE — Telephone Encounter (Signed)
 Called patient to make sure he knows why he takes Amiodarone . Then called his PCP and let message that patient takes Amiodarone  for Frequent PVCs, listed in office visit a few years ago. Patient has been taking since 2018.

## 2024-08-08 ENCOUNTER — Inpatient Hospital Stay: Attending: Oncology | Admitting: Oncology

## 2024-08-08 ENCOUNTER — Telehealth: Payer: Self-pay | Admitting: Oncology

## 2024-08-08 ENCOUNTER — Encounter: Payer: Self-pay | Admitting: *Deleted

## 2024-08-08 NOTE — Telephone Encounter (Signed)
 PT called to reschedule appt; day and time confirmed.

## 2024-08-08 NOTE — Progress Notes (Signed)
 Joe Garcia was no show for OV today. Unable to reach him by phone. Sent scheduling message to reschedule him.

## 2024-08-18 ENCOUNTER — Inpatient Hospital Stay: Admitting: Oncology

## 2024-08-18 VITALS — BP 128/74 | HR 60 | Temp 97.8°F | Resp 18 | Ht 71.0 in | Wt 177.4 lb

## 2024-08-18 DIAGNOSIS — C184 Malignant neoplasm of transverse colon: Secondary | ICD-10-CM | POA: Diagnosis not present

## 2024-08-18 NOTE — Progress Notes (Signed)
 " [facility] OFFICE PROGRESS NOTE   Diagnosis: Colon cancer  INTERVAL HISTORY:   Joe Garcia returns as scheduled.  He feels well.  Good appetite.  He is constipated.  No bleeding.  He reports having an upper respiratory infection last month.  A chest x-ray 07/07/2024 was negative.  He was seen in the emergency room 07/07/2024 and completed a course of Augmentin .  He has a persistent cough.  He relates the cough to COPD.  Objective:  Vital signs in last 24 hours:  Blood pressure 128/74, pulse 60, temperature 97.8 F (36.6 C), temperature source Temporal, resp. rate 18, height 5' 11 (1.803 m), weight 177 lb 6.4 oz (80.5 kg), SpO2 100%.  Lymphatics: No cervical, supraclavicular, axillary, or inguinal nodes Resp: End inspiratory rhonchi at the left upper posterior chest, no respiratory distress Cardio: Regular rate and rhythm GI: No hepatosplenomegaly, nontender, no mass Vascular: No leg edema  Lab Results:  Lab Results  Component Value Date   WBC 11.2 (H) 07/07/2024   HGB 12.9 (L) 07/07/2024   HCT 39.9 07/07/2024   MCV 86.7 07/07/2024   PLT 267 07/07/2024   NEUTROABS 2.6 08/18/2022    CMP  Lab Results  Component Value Date   NA 142 07/07/2024   K 3.6 07/07/2024   CL 108 07/07/2024   CO2 24 07/07/2024   GLUCOSE 88 07/07/2024   BUN 21 07/07/2024   CREATININE 1.42 (H) 07/07/2024   CALCIUM  8.6 (L) 07/07/2024   PROT 7.4 08/18/2022   ALBUMIN 3.8 08/18/2022   AST 16 08/18/2022   ALT 12 08/18/2022   ALKPHOS 91 08/18/2022   BILITOT 0.5 08/18/2022   GFRNONAA 53 (L) 07/07/2024   GFRAA >60 02/13/2020    Lab Results  Component Value Date   CEA1 1.30 01/22/2021   CEA 1.24 06/22/2024    Lab Results  Component Value Date   INR 0.9 06/26/2019   LABPROT 12.3 06/26/2019    Imaging:  No results found.  Medications: I have reviewed the patient's current medications.   Assessment/Plan: Colon cancer, transverse colon, stage IIIc, T4bN2, status post  a partial transverse colectomy 06/30/2019 Grade 3, lymphovascular and perineural invasion present, 4/18 lymph nodes positive, positive lymph nodes mesenteric margin, tumor invades into adherent omentum, MSI high, loss of MLH1 and PMS2 expression; MLH1 methylation not detected; BRAF mutation analysis-negative CT abdomen/pelvis 06/26/2019-thickening within the distal transverse colon, severely atrophic and calcified right kidney CT chest 06/28/2019-no evidence of metastatic disease, stable 3 mm right lower lobe nodule Cycle 1 FOLFOX 08/04/2019 (oxaliplatin  65 mg/m due to renal function) Plan for repeat CTs after he has completed 5 cycles of chemotherapy Cycle 2 FOLFOX 08/18/2019 Cycle 3 FOLFOX 08/31/2019, Udenyca  added Cycle 4 FOLFOX 10/12/2019 Cycle 5 FOLFOX 10/25/2019 Stable right lower lobe nodule on chest CT 09/21/2019 CT abdomen/pelvis 11/07/2019-postoperative changes related to colectomy and anastomosis.  No current evidence of disease.  No adenopathy. Cycle 6 FOLFOX 11/08/2019 Cycle 7 FOLFOX 11/23/2019 Cycle 8 FOLFOX 12/06/2019 Cycle 9 FOLFOX 12/20/2019 Case presented GI tumor conference 12/28/2018-tumor margin negative, positive lymph node at margin; no recommendation for further surgery or radiation; peripancreatic node on baseline CT not seen on April 2021 CT Cycle 10 FOLFOX 01/03/2020  Cycle 11 FOLFOX 01/17/2020-oxaliplatin  and Udenyca  held Cycle 12 FOLFOX 01/31/2020-oxaliplatin  and Udenyca  held, 5-FU dose reduced secondary to mucositis 05/25/2020 colonoscopy-internal hemorrhoids, repeat colonoscopy in 3 years for surveillance CTs 06/11/2020-new trace right pelvic fluid, nonspecific; new mild left upper lobe interstitial/groundglass opacity suspicious for minimal infection  or inflammation; similar 2 mm right lower lobe pulmonary nodule CTs 05/23/2021-no evidence of recurrent colon cancer CTs 05/30/2022-nonspecific groundglass opacities in the bilateral upper lobes, new 4 mm groundglass right upper lobe  nodule, no evidence of metastatic disease CT chest 09/13/2022-markedly increased upper and mid lung zone predominant ill-defined centrilobular nodularity.  New 3 mm lateral right lower lobe nodule.  Previously questioned 4 mm groundglass right upper lobe nodule not appreciated. CT chest 03/16/2023-no evidence of metastatic disease, interval resolution of centrilobular groundglass nodules, solid right lower lobe pulmonary nodule which was new on 09/13/2022 has resolved CT abdomen/pelvis 06/19/2023-no locally recurrent or metastatic colon cancer Colonoscopy 04/13/2024: Polyp removed from the transverse colon-tubular adenoma CTs 06/22/2024: No evidence of metastatic disease, stable patchy tree-in-bud changes in the lungs Asthma Chronic systolic heart failure History of PVCs Atrophic right kidney Microcytic anemia, likely iron deficiency anemia secondary to #1 Port-A-Cath placement on 12/15/2019, Dr. 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  He will return for an office visit and CEA in 6 months.  He will continue colonoscopy surveillance with Dr. Leigh.  He will seek medical attention if the cough does not improve.  Arley Hof, MD  08/18/2024  10:20 AM   "

## 2025-02-15 ENCOUNTER — Inpatient Hospital Stay

## 2025-02-15 ENCOUNTER — Inpatient Hospital Stay: Admitting: Oncology
